# Patient Record
Sex: Female | Born: 1945
Health system: Southern US, Community
[De-identification: ages and names within clinical notes are randomized; demographics above are authoritative.]

## PROBLEM LIST (undated history)

## (undated) DIAGNOSIS — H409 Unspecified glaucoma: Secondary | ICD-10-CM

## (undated) DIAGNOSIS — M1990 Unspecified osteoarthritis, unspecified site: Secondary | ICD-10-CM

## (undated) DIAGNOSIS — M359 Systemic involvement of connective tissue, unspecified: Secondary | ICD-10-CM

## (undated) DIAGNOSIS — T7840XA Allergy, unspecified, initial encounter: Secondary | ICD-10-CM

## (undated) DIAGNOSIS — E669 Obesity, unspecified: Secondary | ICD-10-CM

## (undated) DIAGNOSIS — D649 Anemia, unspecified: Secondary | ICD-10-CM

## (undated) DIAGNOSIS — I10 Essential (primary) hypertension: Secondary | ICD-10-CM

## (undated) DIAGNOSIS — E119 Type 2 diabetes mellitus without complications: Secondary | ICD-10-CM

## (undated) DIAGNOSIS — K219 Gastro-esophageal reflux disease without esophagitis: Secondary | ICD-10-CM

## (undated) DIAGNOSIS — C50919 Malignant neoplasm of unspecified site of unspecified female breast: Secondary | ICD-10-CM

## (undated) DIAGNOSIS — E7800 Pure hypercholesterolemia, unspecified: Secondary | ICD-10-CM

## (undated) HISTORY — DX: Unspecified glaucoma: H40.9

## (undated) HISTORY — DX: Anemia, unspecified: D64.9

## (undated) HISTORY — DX: Essential (primary) hypertension: I10

## (undated) HISTORY — PX: BREAST SURGERY: SHX581

## (undated) HISTORY — DX: Allergy, unspecified, initial encounter: T78.40XA

## (undated) HISTORY — DX: Type 2 diabetes mellitus without complications: E11.9

## (undated) HISTORY — DX: Unspecified osteoarthritis, unspecified site: M19.90

## (undated) HISTORY — DX: Pure hypercholesterolemia, unspecified: E78.00

## (undated) HISTORY — DX: Gastro-esophageal reflux disease without esophagitis: K21.9

## (undated) HISTORY — DX: Obesity, unspecified: E66.9

## (undated) HISTORY — DX: Malignant neoplasm of unspecified site of unspecified female breast: C50.919

---

## 1991-01-20 HISTORY — PX: ABDOMINAL HYSTERECTOMY: SHX81

## 1993-01-19 HISTORY — PX: MASTECTOMY: SHX3

## 1997-08-24 ENCOUNTER — Ambulatory Visit (HOSPITAL_COMMUNITY): Admission: RE | Admit: 1997-08-24 | Discharge: 1997-08-24 | Payer: Self-pay | Admitting: Hematology and Oncology

## 1999-03-22 ENCOUNTER — Emergency Department (HOSPITAL_COMMUNITY): Admission: EM | Admit: 1999-03-22 | Discharge: 1999-03-22 | Payer: Self-pay | Admitting: Emergency Medicine

## 1999-05-06 ENCOUNTER — Encounter: Admission: RE | Admit: 1999-05-06 | Discharge: 1999-05-06 | Payer: Self-pay | Admitting: Internal Medicine

## 1999-05-06 ENCOUNTER — Encounter: Payer: Self-pay | Admitting: Internal Medicine

## 1999-10-16 ENCOUNTER — Emergency Department (HOSPITAL_COMMUNITY): Admission: EM | Admit: 1999-10-16 | Discharge: 1999-10-16 | Payer: Self-pay | Admitting: Emergency Medicine

## 1999-12-08 ENCOUNTER — Encounter: Admission: RE | Admit: 1999-12-08 | Discharge: 1999-12-08 | Payer: Self-pay | Admitting: Urology

## 1999-12-08 ENCOUNTER — Encounter: Payer: Self-pay | Admitting: Urology

## 2000-04-15 ENCOUNTER — Ambulatory Visit (HOSPITAL_BASED_OUTPATIENT_CLINIC_OR_DEPARTMENT_OTHER): Admission: RE | Admit: 2000-04-15 | Discharge: 2000-04-15 | Payer: Self-pay | Admitting: Urology

## 2000-05-05 ENCOUNTER — Encounter: Payer: Self-pay | Admitting: Family Medicine

## 2000-05-05 ENCOUNTER — Encounter: Admission: RE | Admit: 2000-05-05 | Discharge: 2000-05-05 | Payer: Self-pay | Admitting: Family Medicine

## 2000-06-03 ENCOUNTER — Encounter: Payer: Self-pay | Admitting: Family Medicine

## 2000-06-03 ENCOUNTER — Ambulatory Visit (HOSPITAL_COMMUNITY): Admission: RE | Admit: 2000-06-03 | Discharge: 2000-06-03 | Payer: Self-pay | Admitting: Family Medicine

## 2000-10-28 ENCOUNTER — Other Ambulatory Visit: Admission: RE | Admit: 2000-10-28 | Discharge: 2000-10-28 | Payer: Self-pay | Admitting: Family Medicine

## 2001-01-31 ENCOUNTER — Encounter: Payer: Self-pay | Admitting: Family Medicine

## 2001-01-31 ENCOUNTER — Ambulatory Visit (HOSPITAL_COMMUNITY): Admission: RE | Admit: 2001-01-31 | Discharge: 2001-01-31 | Payer: Self-pay | Admitting: Family Medicine

## 2001-02-03 ENCOUNTER — Ambulatory Visit (HOSPITAL_COMMUNITY): Admission: RE | Admit: 2001-02-03 | Discharge: 2001-02-03 | Payer: Self-pay | Admitting: Family Medicine

## 2001-02-03 ENCOUNTER — Encounter: Payer: Self-pay | Admitting: Family Medicine

## 2001-04-19 ENCOUNTER — Emergency Department (HOSPITAL_COMMUNITY): Admission: EM | Admit: 2001-04-19 | Discharge: 2001-04-19 | Payer: Self-pay | Admitting: *Deleted

## 2001-05-19 HISTORY — PX: COLONOSCOPY: SHX174

## 2001-06-01 ENCOUNTER — Ambulatory Visit (HOSPITAL_COMMUNITY): Admission: RE | Admit: 2001-06-01 | Discharge: 2001-06-01 | Payer: Self-pay | Admitting: General Surgery

## 2001-06-11 ENCOUNTER — Emergency Department (HOSPITAL_COMMUNITY): Admission: EM | Admit: 2001-06-11 | Discharge: 2001-06-11 | Payer: Self-pay | Admitting: Emergency Medicine

## 2001-06-22 ENCOUNTER — Encounter: Payer: Self-pay | Admitting: Obstetrics and Gynecology

## 2001-06-22 ENCOUNTER — Ambulatory Visit (HOSPITAL_COMMUNITY): Admission: RE | Admit: 2001-06-22 | Discharge: 2001-06-22 | Payer: Self-pay | Admitting: Obstetrics and Gynecology

## 2001-12-14 ENCOUNTER — Encounter: Payer: Self-pay | Admitting: Hematology & Oncology

## 2001-12-14 ENCOUNTER — Ambulatory Visit (HOSPITAL_COMMUNITY): Admission: RE | Admit: 2001-12-14 | Discharge: 2001-12-14 | Payer: Self-pay | Admitting: Hematology & Oncology

## 2001-12-27 ENCOUNTER — Encounter: Payer: Self-pay | Admitting: Hematology & Oncology

## 2001-12-27 ENCOUNTER — Encounter: Admission: RE | Admit: 2001-12-27 | Discharge: 2001-12-27 | Payer: Self-pay | Admitting: Hematology & Oncology

## 2002-02-17 ENCOUNTER — Encounter: Admission: RE | Admit: 2002-02-17 | Discharge: 2002-04-03 | Payer: Self-pay | Admitting: *Deleted

## 2002-04-26 ENCOUNTER — Ambulatory Visit (HOSPITAL_COMMUNITY): Admission: RE | Admit: 2002-04-26 | Discharge: 2002-04-26 | Payer: Self-pay | Admitting: *Deleted

## 2002-08-24 ENCOUNTER — Emergency Department (HOSPITAL_COMMUNITY): Admission: AD | Admit: 2002-08-24 | Discharge: 2002-08-24 | Payer: Self-pay | Admitting: Emergency Medicine

## 2002-12-28 ENCOUNTER — Ambulatory Visit (HOSPITAL_COMMUNITY): Admission: RE | Admit: 2002-12-28 | Discharge: 2002-12-28 | Payer: Self-pay | Admitting: Family Medicine

## 2003-01-01 ENCOUNTER — Ambulatory Visit (HOSPITAL_COMMUNITY): Admission: RE | Admit: 2003-01-01 | Discharge: 2003-01-01 | Payer: Self-pay | Admitting: Family Medicine

## 2003-02-05 ENCOUNTER — Encounter: Admission: RE | Admit: 2003-02-05 | Discharge: 2003-04-26 | Payer: Self-pay | Admitting: Urology

## 2003-09-25 ENCOUNTER — Emergency Department (HOSPITAL_COMMUNITY): Admission: EM | Admit: 2003-09-25 | Discharge: 2003-09-25 | Payer: Self-pay | Admitting: Emergency Medicine

## 2004-02-21 ENCOUNTER — Ambulatory Visit: Payer: Self-pay | Admitting: Family Medicine

## 2004-03-04 ENCOUNTER — Ambulatory Visit: Payer: Self-pay | Admitting: Family Medicine

## 2004-03-21 ENCOUNTER — Ambulatory Visit: Payer: Self-pay | Admitting: Family Medicine

## 2004-03-25 ENCOUNTER — Encounter: Admission: RE | Admit: 2004-03-25 | Discharge: 2004-03-25 | Payer: Self-pay | Admitting: Family Medicine

## 2004-06-20 ENCOUNTER — Ambulatory Visit: Payer: Self-pay | Admitting: Family Medicine

## 2004-07-07 ENCOUNTER — Ambulatory Visit: Payer: Self-pay | Admitting: Family Medicine

## 2004-08-07 ENCOUNTER — Ambulatory Visit: Payer: Self-pay | Admitting: Hematology & Oncology

## 2004-08-11 ENCOUNTER — Ambulatory Visit: Payer: Self-pay | Admitting: Family Medicine

## 2004-09-11 ENCOUNTER — Ambulatory Visit: Payer: Self-pay | Admitting: Family Medicine

## 2004-10-27 ENCOUNTER — Ambulatory Visit: Payer: Self-pay | Admitting: Family Medicine

## 2004-12-23 ENCOUNTER — Ambulatory Visit: Payer: Self-pay | Admitting: Family Medicine

## 2005-01-27 ENCOUNTER — Ambulatory Visit: Payer: Self-pay | Admitting: Family Medicine

## 2005-03-18 ENCOUNTER — Ambulatory Visit: Payer: Self-pay | Admitting: Family Medicine

## 2005-04-14 ENCOUNTER — Ambulatory Visit: Payer: Self-pay | Admitting: Family Medicine

## 2005-06-03 ENCOUNTER — Ambulatory Visit: Payer: Self-pay | Admitting: Family Medicine

## 2005-07-17 ENCOUNTER — Encounter (INDEPENDENT_AMBULATORY_CARE_PROVIDER_SITE_OTHER): Payer: Self-pay | Admitting: Specialist

## 2005-07-17 ENCOUNTER — Ambulatory Visit (HOSPITAL_BASED_OUTPATIENT_CLINIC_OR_DEPARTMENT_OTHER): Admission: RE | Admit: 2005-07-17 | Discharge: 2005-07-17 | Payer: Self-pay | Admitting: Urology

## 2005-07-29 ENCOUNTER — Ambulatory Visit: Payer: Self-pay | Admitting: Hematology & Oncology

## 2005-09-24 ENCOUNTER — Ambulatory Visit: Payer: Self-pay | Admitting: Family Medicine

## 2005-10-27 ENCOUNTER — Ambulatory Visit: Payer: Self-pay | Admitting: Family Medicine

## 2005-11-10 ENCOUNTER — Ambulatory Visit: Payer: Self-pay | Admitting: Family Medicine

## 2005-12-08 ENCOUNTER — Encounter: Admission: RE | Admit: 2005-12-08 | Discharge: 2005-12-08 | Payer: Self-pay | Admitting: Family Medicine

## 2006-01-05 ENCOUNTER — Other Ambulatory Visit: Admission: RE | Admit: 2006-01-05 | Discharge: 2006-01-05 | Payer: Self-pay | Admitting: Family Medicine

## 2006-01-05 ENCOUNTER — Ambulatory Visit: Payer: Self-pay | Admitting: Family Medicine

## 2006-01-05 ENCOUNTER — Encounter: Payer: Self-pay | Admitting: Family Medicine

## 2006-01-05 LAB — CONVERTED CEMR LAB: Pap Smear: NORMAL

## 2006-01-15 ENCOUNTER — Ambulatory Visit (HOSPITAL_COMMUNITY): Admission: RE | Admit: 2006-01-15 | Discharge: 2006-01-15 | Payer: Self-pay | Admitting: Family Medicine

## 2006-02-02 ENCOUNTER — Encounter: Payer: Self-pay | Admitting: Family Medicine

## 2006-02-02 LAB — CONVERTED CEMR LAB
Blood Glucose, Fasting: 92 mg/dL
RBC count: 3.96 10*6/uL
TSH: 1.767 microintl units/mL
WBC, blood: 3.4 10*3/uL

## 2006-03-17 ENCOUNTER — Ambulatory Visit: Payer: Self-pay | Admitting: Family Medicine

## 2006-03-17 LAB — CONVERTED CEMR LAB
Bilirubin Urine: NEGATIVE
Hemoglobin, Urine: NEGATIVE
Ketones, ur: NEGATIVE mg/dL
Leukocytes, UA: NEGATIVE
Nitrite: NEGATIVE
Protein, ur: NEGATIVE mg/dL
Red Sub, UA: NEGATIVE
Specific Gravity, Urine: 1.014 (ref 1.005–1.03)
Urine Glucose: NEGATIVE mg/dL
Urobilinogen, UA: 1 (ref 0.0–1.0)
pH: 6.5 (ref 5.0–8.0)

## 2006-03-18 ENCOUNTER — Encounter: Payer: Self-pay | Admitting: Family Medicine

## 2006-04-07 ENCOUNTER — Ambulatory Visit: Payer: Self-pay | Admitting: Family Medicine

## 2006-07-08 ENCOUNTER — Ambulatory Visit: Payer: Self-pay | Admitting: Family Medicine

## 2006-08-02 ENCOUNTER — Encounter: Payer: Self-pay | Admitting: Family Medicine

## 2006-08-02 LAB — CONVERTED CEMR LAB
BUN: 11 mg/dL (ref 6–23)
CO2: 23 meq/L (ref 19–32)
Calcium: 9.2 mg/dL (ref 8.4–10.5)
Chloride: 105 meq/L (ref 96–112)
Cholesterol: 182 mg/dL (ref 0–200)
Creatinine, Ser: 0.95 mg/dL (ref 0.40–1.20)
Glucose, Bld: 85 mg/dL (ref 70–99)
HDL: 44 mg/dL (ref >39)
LDL Cholesterol: 125 mg/dL — ABNORMAL HIGH (ref 0–99)
Potassium: 4.2 meq/L (ref 3.5–5.3)
Sodium: 141 meq/L (ref 135–145)
Total CHOL/HDL Ratio: 4.1
Triglycerides: 67 mg/dL (ref <150)
VLDL: 13 mg/dL (ref 0–40)

## 2006-08-04 ENCOUNTER — Ambulatory Visit: Payer: Self-pay | Admitting: Hematology & Oncology

## 2006-08-10 ENCOUNTER — Ambulatory Visit: Payer: Self-pay | Admitting: Family Medicine

## 2006-10-18 ENCOUNTER — Ambulatory Visit: Payer: Self-pay | Admitting: Family Medicine

## 2006-12-24 ENCOUNTER — Encounter: Admission: RE | Admit: 2006-12-24 | Discharge: 2006-12-24 | Payer: Self-pay | Admitting: Family Medicine

## 2007-01-25 ENCOUNTER — Ambulatory Visit: Payer: Self-pay | Admitting: Family Medicine

## 2007-02-08 ENCOUNTER — Encounter: Payer: Self-pay | Admitting: Family Medicine

## 2007-02-08 DIAGNOSIS — Z853 Personal history of malignant neoplasm of breast: Secondary | ICD-10-CM

## 2007-02-08 DIAGNOSIS — E663 Overweight: Secondary | ICD-10-CM

## 2007-02-08 DIAGNOSIS — I10 Essential (primary) hypertension: Secondary | ICD-10-CM

## 2007-02-22 ENCOUNTER — Ambulatory Visit: Payer: Self-pay | Admitting: Family Medicine

## 2007-03-28 ENCOUNTER — Encounter: Payer: Self-pay | Admitting: Family Medicine

## 2007-03-28 LAB — CONVERTED CEMR LAB
BUN: 11 mg/dL (ref 6–23)
Basophils Absolute: 0 10*3/uL (ref 0.0–0.1)
Basophils Relative: 1 % (ref 0–1)
CO2: 25 meq/L (ref 19–32)
Calcium: 8.9 mg/dL (ref 8.4–10.5)
Chloride: 105 meq/L (ref 96–112)
Cholesterol: 173 mg/dL (ref 0–200)
Creatinine, Ser: 0.88 mg/dL (ref 0.40–1.20)
Eosinophils Absolute: 0 10*3/uL (ref 0.0–0.7)
Eosinophils Relative: 1 % (ref 0–5)
Glucose, Bld: 94 mg/dL (ref 70–99)
HCT: 36.9 % (ref 36.0–46.0)
HDL: 42 mg/dL (ref >39)
Hemoglobin: 11.7 g/dL — ABNORMAL LOW (ref 12.0–15.0)
LDL Cholesterol: 119 mg/dL — ABNORMAL HIGH (ref 0–99)
Lymphocytes Relative: 28 % (ref 12–46)
Lymphs Abs: 1 10*3/uL (ref 0.7–4.0)
MCHC: 31.7 g/dL (ref 30.0–36.0)
MCV: 92.3 fL (ref 78.0–100.0)
Monocytes Absolute: 0.2 10*3/uL (ref 0.1–1.0)
Monocytes Relative: 6 % (ref 3–12)
Neutro Abs: 2.4 10*3/uL (ref 1.7–7.7)
Neutrophils Relative %: 65 % (ref 43–77)
Platelets: 263 10*3/uL (ref 150–400)
Potassium: 4.3 meq/L (ref 3.5–5.3)
RBC: 4 M/uL (ref 3.87–5.11)
RDW: 14.7 % (ref 11.5–15.5)
Sodium: 137 meq/L (ref 135–145)
Total CHOL/HDL Ratio: 4.1
Triglycerides: 61 mg/dL (ref <150)
VLDL: 12 mg/dL (ref 0–40)
WBC: 3.7 10*3/uL — ABNORMAL LOW (ref 4.0–10.5)

## 2007-03-30 ENCOUNTER — Ambulatory Visit: Payer: Self-pay | Admitting: Family Medicine

## 2007-05-09 ENCOUNTER — Ambulatory Visit: Payer: Self-pay | Admitting: Family Medicine

## 2007-06-30 ENCOUNTER — Ambulatory Visit: Payer: Self-pay | Admitting: Family Medicine

## 2007-07-28 ENCOUNTER — Ambulatory Visit: Payer: Self-pay | Admitting: Family Medicine

## 2007-09-08 ENCOUNTER — Telehealth: Payer: Self-pay | Admitting: Family Medicine

## 2007-10-31 ENCOUNTER — Ambulatory Visit: Payer: Self-pay | Admitting: Family Medicine

## 2007-11-01 ENCOUNTER — Encounter: Payer: Self-pay | Admitting: Family Medicine

## 2008-01-03 ENCOUNTER — Encounter: Admission: RE | Admit: 2008-01-03 | Discharge: 2008-01-03 | Payer: Self-pay | Admitting: Family Medicine

## 2008-02-01 ENCOUNTER — Other Ambulatory Visit: Admission: RE | Admit: 2008-02-01 | Discharge: 2008-02-01 | Payer: Self-pay | Admitting: Family Medicine

## 2008-02-01 ENCOUNTER — Encounter: Payer: Self-pay | Admitting: Family Medicine

## 2008-02-01 ENCOUNTER — Ambulatory Visit: Payer: Self-pay | Admitting: Family Medicine

## 2008-02-01 DIAGNOSIS — D280 Benign neoplasm of vulva: Secondary | ICD-10-CM | POA: Insufficient documentation

## 2008-02-01 DIAGNOSIS — R5383 Other fatigue: Secondary | ICD-10-CM

## 2008-02-09 ENCOUNTER — Encounter: Payer: Self-pay | Admitting: Family Medicine

## 2008-03-05 ENCOUNTER — Telehealth: Payer: Self-pay | Admitting: Family Medicine

## 2008-04-10 ENCOUNTER — Encounter: Payer: Self-pay | Admitting: Family Medicine

## 2008-04-11 LAB — CONVERTED CEMR LAB
ALT: 11 units/L (ref 0–35)
AST: 14 units/L (ref 0–37)
Albumin: 3.7 g/dL (ref 3.5–5.2)
Alkaline Phosphatase: 68 units/L (ref 39–117)
BUN: 11 mg/dL (ref 6–23)
Bilirubin, Direct: 0.1 mg/dL (ref 0.0–0.3)
CO2: 22 meq/L (ref 19–32)
Calcium: 8.8 mg/dL (ref 8.4–10.5)
Chloride: 105 meq/L (ref 96–112)
Cholesterol: 196 mg/dL (ref 0–200)
Creatinine, Ser: 0.93 mg/dL (ref 0.40–1.20)
Glucose, Bld: 102 mg/dL — ABNORMAL HIGH (ref 70–99)
HCT: 38.3 % (ref 36.0–46.0)
HDL: 39 mg/dL — ABNORMAL LOW (ref >39)
Hemoglobin: 12.2 g/dL (ref 12.0–15.0)
Indirect Bilirubin: 0.3 mg/dL (ref 0.0–0.9)
LDL Cholesterol: 138 mg/dL — ABNORMAL HIGH (ref 0–99)
MCHC: 31.9 g/dL (ref 30.0–36.0)
MCV: 91.2 fL (ref 78.0–100.0)
Platelets: 255 10*3/uL (ref 150–400)
Potassium: 4 meq/L (ref 3.5–5.3)
RBC: 4.2 M/uL (ref 3.87–5.11)
RDW: 14.8 % (ref 11.5–15.5)
Sodium: 139 meq/L (ref 135–145)
Total Bilirubin: 0.4 mg/dL (ref 0.3–1.2)
Total CHOL/HDL Ratio: 5
Total Protein: 7.8 g/dL (ref 6.0–8.3)
Triglycerides: 96 mg/dL (ref <150)
VLDL: 19 mg/dL (ref 0–40)
WBC: 3.9 10*3/uL — ABNORMAL LOW (ref 4.0–10.5)

## 2008-07-26 ENCOUNTER — Ambulatory Visit: Payer: Self-pay | Admitting: Family Medicine

## 2008-07-26 DIAGNOSIS — B351 Tinea unguium: Secondary | ICD-10-CM

## 2008-08-23 ENCOUNTER — Ambulatory Visit: Payer: Self-pay | Admitting: Family Medicine

## 2008-08-23 DIAGNOSIS — Z13220 Encounter for screening for lipoid disorders: Secondary | ICD-10-CM

## 2008-08-28 ENCOUNTER — Telehealth: Payer: Self-pay | Admitting: Family Medicine

## 2008-10-19 LAB — CONVERTED CEMR LAB
ALT: 17 units/L (ref 0–35)
AST: 18 units/L (ref 0–37)
Albumin: 4.2 g/dL (ref 3.5–5.2)
Alkaline Phosphatase: 71 units/L (ref 39–117)
BUN: 13 mg/dL (ref 6–23)
Bilirubin, Direct: 0.1 mg/dL (ref 0.0–0.3)
CO2: 24 meq/L (ref 19–32)
Calcium: 9.6 mg/dL (ref 8.4–10.5)
Chloride: 105 meq/L (ref 96–112)
Cholesterol: 168 mg/dL (ref 0–200)
Creatinine, Ser: 0.87 mg/dL (ref 0.40–1.20)
Glucose, Bld: 90 mg/dL (ref 70–99)
HDL: 46 mg/dL (ref >39)
Indirect Bilirubin: 0.3 mg/dL (ref 0.0–0.9)
LDL Cholesterol: 109 mg/dL — ABNORMAL HIGH (ref 0–99)
Potassium: 4.9 meq/L (ref 3.5–5.3)
Sodium: 141 meq/L (ref 135–145)
TSH: 2.372 microintl units/mL (ref 0.350–4.500)
Total Bilirubin: 0.4 mg/dL (ref 0.3–1.2)
Total CHOL/HDL Ratio: 3.7
Total Protein: 8 g/dL (ref 6.0–8.3)
Triglycerides: 65 mg/dL (ref <150)
VLDL: 13 mg/dL (ref 0–40)

## 2008-10-31 ENCOUNTER — Ambulatory Visit: Payer: Self-pay | Admitting: Family Medicine

## 2008-10-31 DIAGNOSIS — N3010 Interstitial cystitis (chronic) without hematuria: Secondary | ICD-10-CM | POA: Insufficient documentation

## 2009-01-04 ENCOUNTER — Ambulatory Visit (HOSPITAL_COMMUNITY): Admission: RE | Admit: 2009-01-04 | Discharge: 2009-01-04 | Payer: Self-pay | Admitting: Family Medicine

## 2009-03-05 ENCOUNTER — Ambulatory Visit: Payer: Self-pay | Admitting: Family Medicine

## 2009-03-05 LAB — CONVERTED CEMR LAB
Bilirubin Urine: NEGATIVE
Blood in Urine, dipstick: NEGATIVE
Glucose, Urine, Semiquant: NEGATIVE
Ketones, urine, test strip: NEGATIVE
Nitrite: NEGATIVE
Protein, U semiquant: NEGATIVE
Specific Gravity, Urine: 1.015
Urobilinogen, UA: 0.2
pH: 6.5

## 2009-03-06 ENCOUNTER — Encounter: Payer: Self-pay | Admitting: Family Medicine

## 2009-06-07 ENCOUNTER — Ambulatory Visit: Payer: Self-pay | Admitting: Family Medicine

## 2009-06-07 LAB — CONVERTED CEMR LAB
Bilirubin Urine: NEGATIVE
Blood in Urine, dipstick: NEGATIVE
Glucose, Urine, Semiquant: NEGATIVE
Ketones, urine, test strip: NEGATIVE
Nitrite: NEGATIVE
Protein, U semiquant: NEGATIVE
Specific Gravity, Urine: 1.02
Urobilinogen, UA: 0.2
WBC Urine, dipstick: NEGATIVE
pH: 7

## 2009-06-11 LAB — CONVERTED CEMR LAB
ALT: 13 units/L (ref 0–35)
AST: 14 units/L (ref 0–37)
Albumin: 4 g/dL (ref 3.5–5.2)
Alkaline Phosphatase: 69 units/L (ref 39–117)
BUN: 10 mg/dL (ref 6–23)
Basophils Absolute: 0 10*3/uL (ref 0.0–0.1)
Basophils Relative: 1 % (ref 0–1)
Bilirubin, Direct: 0.1 mg/dL (ref 0.0–0.3)
CO2: 24 meq/L (ref 19–32)
Calcium: 9.7 mg/dL (ref 8.4–10.5)
Chloride: 105 meq/L (ref 96–112)
Cholesterol: 162 mg/dL (ref 0–200)
Creatinine, Ser: 0.86 mg/dL (ref 0.40–1.20)
Eosinophils Absolute: 0 10*3/uL (ref 0.0–0.7)
Eosinophils Relative: 1 % (ref 0–5)
Glucose, Bld: 94 mg/dL (ref 70–99)
HCT: 38.6 % (ref 36.0–46.0)
HDL: 44 mg/dL (ref >39)
Hemoglobin: 12 g/dL (ref 12.0–15.0)
Indirect Bilirubin: 0.2 mg/dL (ref 0.0–0.9)
LDL Cholesterol: 105 mg/dL — ABNORMAL HIGH (ref 0–99)
Lymphocytes Relative: 36 % (ref 12–46)
Lymphs Abs: 1.1 10*3/uL (ref 0.7–4.0)
MCHC: 31.1 g/dL (ref 30.0–36.0)
MCV: 91 fL (ref 78.0–100.0)
Monocytes Absolute: 0.1 10*3/uL (ref 0.1–1.0)
Monocytes Relative: 2 % — ABNORMAL LOW (ref 3–12)
Neutro Abs: 1.8 10*3/uL (ref 1.7–7.7)
Neutrophils Relative %: 60 % (ref 43–77)
Platelets: 296 10*3/uL (ref 150–400)
Potassium: 4 meq/L (ref 3.5–5.3)
RBC: 4.24 M/uL (ref 3.87–5.11)
RDW: 14 % (ref 11.5–15.5)
Sodium: 141 meq/L (ref 135–145)
Total Bilirubin: 0.3 mg/dL (ref 0.3–1.2)
Total CHOL/HDL Ratio: 3.7
Total Protein: 8 g/dL (ref 6.0–8.3)
Triglycerides: 63 mg/dL (ref <150)
VLDL: 13 mg/dL (ref 0–40)
Vit D, 25-Hydroxy: 35 ng/mL (ref 30–89)
WBC: 3 10*3/uL — ABNORMAL LOW (ref 4.0–10.5)

## 2009-06-27 ENCOUNTER — Encounter: Payer: Self-pay | Admitting: Family Medicine

## 2009-11-06 ENCOUNTER — Ambulatory Visit: Payer: Self-pay | Admitting: Family Medicine

## 2009-11-06 LAB — CONVERTED CEMR LAB
ALT: 14 units/L (ref 0–35)
AST: 14 units/L (ref 0–37)
Albumin: 4.1 g/dL (ref 3.5–5.2)
Alkaline Phosphatase: 78 units/L (ref 39–117)
BUN: 13 mg/dL (ref 6–23)
Bilirubin, Direct: 0.1 mg/dL (ref 0.0–0.3)
CO2: 27 meq/L (ref 19–32)
Calcium: 9.7 mg/dL (ref 8.4–10.5)
Chloride: 102 meq/L (ref 96–112)
Cholesterol: 180 mg/dL (ref 0–200)
Creatinine, Ser: 0.92 mg/dL (ref 0.40–1.20)
Glucose, Bld: 95 mg/dL (ref 70–99)
HDL: 41 mg/dL (ref >39)
Indirect Bilirubin: 0.3 mg/dL (ref 0.0–0.9)
LDL Cholesterol: 123 mg/dL — ABNORMAL HIGH (ref 0–99)
Potassium: 4.3 meq/L (ref 3.5–5.3)
Sodium: 142 meq/L (ref 135–145)
TSH: 2.434 microintl units/mL (ref 0.350–4.500)
Total Bilirubin: 0.4 mg/dL (ref 0.3–1.2)
Total CHOL/HDL Ratio: 4.4
Total Protein: 8.1 g/dL (ref 6.0–8.3)
Triglycerides: 79 mg/dL (ref <150)
VLDL: 16 mg/dL (ref 0–40)

## 2009-11-10 DIAGNOSIS — K219 Gastro-esophageal reflux disease without esophagitis: Secondary | ICD-10-CM

## 2009-11-15 ENCOUNTER — Telehealth: Payer: Self-pay | Admitting: Family Medicine

## 2010-01-06 ENCOUNTER — Telehealth (INDEPENDENT_AMBULATORY_CARE_PROVIDER_SITE_OTHER): Payer: Self-pay | Admitting: *Deleted

## 2010-01-07 ENCOUNTER — Telehealth: Payer: Self-pay | Admitting: Family Medicine

## 2010-01-07 ENCOUNTER — Encounter: Payer: Self-pay | Admitting: Family Medicine

## 2010-01-24 ENCOUNTER — Ambulatory Visit (HOSPITAL_COMMUNITY)
Admission: RE | Admit: 2010-01-24 | Discharge: 2010-01-24 | Payer: Self-pay | Source: Home / Self Care | Attending: Family Medicine | Admitting: Family Medicine

## 2010-02-09 ENCOUNTER — Encounter: Payer: Self-pay | Admitting: Family Medicine

## 2010-02-10 ENCOUNTER — Encounter: Payer: Self-pay | Admitting: Neurosurgery

## 2010-02-16 LAB — CONVERTED CEMR LAB: OCCULT 1: NEGATIVE

## 2010-02-20 NOTE — Assessment & Plan Note (Signed)
Summary: office visit   Vital Signs:  Patient profile:   65 year old female Menstrual status:  hysterectomy Height:      69 inches Weight:      214.75 pounds BMI:     31.83 O2 Sat:      98 % Pulse rate:   94 / minute Pulse rhythm:   regular Resp:     16 per minute BP sitting:   120 / 86  (left arm) Cuff size:   large  Vitals Entered By: Everitt Amber LPN (Jun 07, 2009 9:56 AM)  Nutrition Counseling: Patient's BMI is greater than 25 and therefore counseled on weight management options. CC: Follow up htn   CC:  Follow up htn.  History of Present Illness: Reports  that she hjas been doing fairly well. She is concerned that her urinary symptoms havbe worsened in recent times with dysuria and frequency particularly at night. She was on med for IC in the past and feels as though she needs re-eval for this. She has not been diligent in lifestyle changes for weight loss, with no iomprovement in this area. Denies recent fever or chills. Denies sinus pressure, nasal congestion , ear pain or sore throat. Denies chest congestion, or cough productive of sputum. Denies chest pain, palpitations, PND, orthopnea or leg swelling. Denies abdominal pain, nausea, vomitting, diarrhea or constipation. Denies change in bowel movements or bloody stool.  She does have   joint pain, swelling, and  reduced mobility involving her back as well as hips , shoulders and knees Denies headaches, vertigo, seizures. Denies depression, anxiety or insomnia.      Current Medications (verified): 1)  Adult Aspirin Ec Low Strength 81 Mg  Tbec (Aspirin) .... Take One Tablet By Mouth Once A Day 2)  Phenazopyridine Hcl 200 Mg  Tabs (Phenazopyridine Hcl) .... Take One Tablet By Mouth Three Times A Day 3)  Oscal 500/200 D-3 500-200 Mg-Unit  Tabs (Calcium-Vitamin D) .... Take One Tablet By Mouth Twice A Day 4)  Womens One Daily   Tabs (Multiple Vitamins-Minerals) .... One Tab By Mouth Once Daily 5)  Dicyclomine Hcl 10  Mg  Caps (Dicyclomine Hcl) .... One Cap By Mouth Three Times A Day 6)  Ranitidine Hcl 150 Mg  Tabs (Ranitidine Hcl) .... One Tab By Mouth Once Daily 7)  Docusate Sodium 100 Mg Tabs (Docusate Sodium) .... Take 1 Tab By Mouth Once Daily As Needed 8)  Simvastatin 10 Mg Tabs (Simvastatin) .... One Tab By Mouth Qhs 9)  Fish Oil 1000 Mg Caps (Omega-3 Fatty Acids) .... Use As Directed 10)  Vitamin D 400 Unit Tabs (Cholecalciferol) .... Take 1 Tablet By Mouth Once A Day 11)  Amlodipine Besylate 10 Mg Tabs (Amlodipine Besylate) .... Take 1 Tablet By Mouth Once A Day 12)  Benazepril Hcl 40 Mg Tabs (Benazepril Hcl) .... One Tab By Mouth Once Daily  Allergies (verified): 1)  ! Levaquin 2)  ! Pcn 3)  ! Sulfa 4)  ! Macrobid  Review of Systems      See HPI Eyes:  Denies blurring, double vision, itching, and red eye. GU:  Complains of dysuria and urinary frequency; 2 week hiostory of increased symptoms with back pain. Derm:  Complains of changes in nail beds; fungal infection in  great toe and little toe. Endo:  Denies cold intolerance, excessive hunger, excessive thirst, excessive urination, heat intolerance, polyuria, and weight change. Heme:  Denies abnormal bruising and bleeding. Allergy:  Complains of seasonal allergies.  Physical Exam  General:  alert, well-hydrated, and overweight-appearing HEENT: No facial asymmetry,  EOMI, No sinus tenderness, TM's Clear, oropharynx  pink and moist. Necvk supple, no jVD , no adenopathy  Chest: Clear to auscultation bilaterally.  CVS: S1, S2, No murmurs, No S3.   Abd: Soft, Nontender. No renal angle tenderness MS: Adequate ROM spine, hips, shoulders and knees. though reduced Ext: No edema.   CNS: CN 2-12 intact, power tone and sensation normal throughout.   Skin: Intact, fungal skin infection between 4th and 5th right toes Psych: Good eye contact, normal affect.  Memory intact, not anxious or depressed appearing.    Impression &  Recommendations:  Problem # 1:  ACUTE CYSTITIS (ICD-595.0) Assessment Comment Only  The following medications were removed from the medication list:    Ciprofloxacin Hcl 500 Mg Tabs (Ciprofloxacin hcl) .Marland Kitchen... Take 1 tablet by mouth two times a day    Ciprofloxacin Hcl 500 Mg Tabs (Ciprofloxacin hcl) ..... One tab by mouth two times a day  Orders: UA Dipstick W/ Micro (manual) (81000)pt reassured no evidence of infection  Problem # 2:  INTERSTITIAL CYSTITIS (ICD-595.1) Assessment: Deteriorated  Orders: Urology Referral (Urology)  Problem # 3:  HYPERLIPIDEMIA (ICD-272.4) Assessment: Comment Only  Her updated medication list for this problem includes:    Simvastatin 10 Mg Tabs (Simvastatin) ..... One tab by mouth qhs  Orders: T-Hepatic Function [id number]) T-Lipid Profile [phone]) T-Hepatic Function [id number]) T-Lipid Profile [phone])  Labs Reviewed: SGOT: 18 (10/19/2008)   SGPT: 17 (10/19/2008)   HDL:46 (10/19/2008), 39 (04/10/2008)  LDL:109 (10/19/2008), 138 (04/10/2008)  Chol:168 (10/19/2008), 196 (04/10/2008)  Trig:65 (10/19/2008), 96 (04/10/2008)  Problem # 4:  ESSENTIAL HYPERTENSION, BENIGN (ICD-401.1) Assessment: Unchanged  Her updated medication list for this problem includes:    Amlodipine Besylate 10 Mg Tabs (Amlodipine besylate) .Marland Kitchen... Take 1 tablet by mouth once a day    Benazepril Hcl 40 Mg Tabs (Benazepril hcl) ..... One tab by mouth once daily  BP today: 120/86 Prior BP: 120/82 (03/05/2009)  Labs Reviewed: K+: 4.9 (10/19/2008) Creat: : 0.87 (10/19/2008)   Chol: 168 (10/19/2008)   HDL: 46 (10/19/2008)   LDL: 109 (10/19/2008)   TG: 65 (10/19/2008)  Problem # 5:  OBESITY, UNSPECIFIED (ICD-278.00) Assessment: Unchanged  Ht: 69 (06/07/2009)   Wt: 214.75 (06/07/2009)   BMI: 31.83 (06/07/2009)  Problem # 6:  ADENOCARCINOMA, BREAST, HX OF (ICD-V10.3) Assessment: Comment Only mamo UTD, pt is more than 5 yrs disease free  Complete Medication  List: 1)  Adult Aspirin Ec Low Strength 81 Mg Tbec (Aspirin) .... Take one tablet by mouth once a day 2)  Phenazopyridine Hcl 200 Mg Tabs (Phenazopyridine hcl) .... Take one tablet by mouth three times a day 3)  Oscal 500/200 D-3 500-200 Mg-unit Tabs (Calcium-vitamin d) .... Take one tablet by mouth twice a day 4)  Womens One Daily Tabs (Multiple vitamins-minerals) .... One tab by mouth once daily 5)  Dicyclomine Hcl 10 Mg Caps (Dicyclomine hcl) .... One cap by mouth three times a day 6)  Ranitidine Hcl 150 Mg Tabs (Ranitidine hcl) .... One tab by mouth once daily 7)  Docusate Sodium 100 Mg Tabs (Docusate sodium) .... Take 1 tab by mouth once daily as needed 8)  Simvastatin 10 Mg Tabs (Simvastatin) .... One tab by mouth qhs 9)  Fish Oil 1000 Mg Caps (Omega-3 fatty acids) .... Use as directed 10)  Vitamin D 400 Unit Tabs (Cholecalciferol) .... Take 1 tablet by mouth once a day 11)  Amlodipine  Besylate 10 Mg Tabs (Amlodipine besylate) .... Take 1 tablet by mouth once a day 12)  Benazepril Hcl 40 Mg Tabs (Benazepril hcl) .... One tab by mouth once daily 13)  Terbinafine Hcl 250 Mg Tabs (Terbinafine hcl) .... Take 1 tablet by mouth once a day  Other Orders: T-Basic Metabolic Panel [id number]) T-CBC w/Diff [phone]) T-Vitamin D (25-Hydroxy) [id number]) T-CBC w/Diff [id number])  Patient Instructions: 1)  Please schedule a follow-up appointment in 4.5 months.Needs rectal at that time 2)  It is important that you exercise regularly at least 20 minutes 5 times a week. If you develop chest pain, have severe difficulty breathing, or feel very tired , stop exercising immediately and seek medical attention. 3)  You need to lose weight. Consider a lower calorie diet and regular exercise.  4)  BMP prior to visit, ICD-9: 5)  Hepatic Panel prior to visit, ICD-9:  fasting asap 6)  Lipid Panel prior to visit, ICD-9: 7)  cBC and Vit D 8)  No med changes at this visit 9)  Hepatic Panel prior to  visit, ICD-9: 10)  Lipid Panel prior to visit, ICD-9: 11)  CBC w/ Diff prior to visit, ICD-9: Prescriptions: AMLODIPINE BESYLATE 10 MG TABS (AMLODIPINE BESYLATE) Take 1 tablet by mouth once a day  #90 x 3   Entered by:   Everitt Amber LPN   Authorized by:   Syliva Overman MD   Signed by:   Everitt Amber LPN on 42/59/5638   Method used:   Faxed to ...       [facility] (mail-order)             , [place]         Ph: [phone]       Fax: [phone]   RxID:   [id number] SIMVASTATIN 10 MG TABS (SIMVASTATIN) one tab by mouth qhs  #90 x 3   Entered by:   Everitt Amber LPN   Authorized by:   Syliva Overman MD   Signed by:   Everitt Amber LPN on 25/42/7062   Method used:   Faxed to ...       [facility] (mail-order)             , [place]         Ph: [phone]       Fax: [phone]   RxID:   [id number] RANITIDINE HCL 150 MG  TABS (RANITIDINE HCL) one tab by mouth once daily  #90 x 3   Entered by:   Everitt Amber LPN   Authorized by:   Syliva Overman MD   Signed by:   Everitt Amber LPN on 38/18/2993   Method used:   Faxed to ...       [facility] (mail-order)             , [place]         Ph: [phone]       Fax: [phone]   RxID:   [id number] DICYCLOMINE HCL 10 MG  CAPS (DICYCLOMINE HCL) one cap by mouth three times a day  #270 x 3   Entered by:   Everitt Amber LPN   Authorized by:   Syliva Overman MD   Signed by:   Everitt Amber LPN on 15/40/0867   Method used:   Faxed to ...       [facility] (mail-order)             , [place]  Ph: [phone]       Fax: [phone]   RxID:   [id number] TERBINAFINE HCL 250 MG TABS (TERBINAFINE HCL) Take 1 tablet by mouth once a day  #30 x 2   Entered and Authorized by:   Syliva Overman MD   Signed by:   Syliva Overman MD on 06/07/2009   Method used:   Electronically to        [facility] (retail)       [address]       [place], [place]  [address]       Ph:  [phone]       Fax: [phone]   RxID:   [id number]   Laboratory Results   Urine Tests    Routine Urinalysis   Color: lt. yellow Appearance: Clear Glucose: negative   (Normal Range: Negative) Bilirubin: negative   (Normal Range: Negative) Ketone: negative   (Normal Range: Negative) Spec. Gravity: 1.020   (Normal Range: 1.003-1.035) Blood: negative   (Normal Range: Negative) pH: 7.0   (Normal Range: 5.0-8.0) Protein: negative   (Normal Range: Negative) Urobilinogen: 0.2   (Normal Range: 0-1) Nitrite: negative   (Normal Range: Negative) Leukocyte Esterace: negative   (Normal Range: Negative)

## 2010-02-20 NOTE — Progress Notes (Signed)
Summary: MAMMOGRAM  Phone Note Call from Patient   Summary of Call: PATIENT CALLED WANTED TO KNOW COULD DR. Lodema Hong SEND HER TO GET HER MAMMOGRAM AT [facility] Initial call taken by: Eugenio Hoes,  January 06, 2010 2:12 PM  Follow-up for Phone Call        due pls sched and let her know Follow-up by: Syliva Overman MD,  January 06, 2010 6:47 PM  Additional Follow-up for Phone Call Additional follow up Details #1::        tried to call patient and advised them of their appt for Jan. 6, 2012 register at 9:45, sent letter out after unable to leave a msg. Additional Follow-up by: Curtis Sites,  January 07, 2010 9:26 AM  New Problems: OTHER SCREENING MAMMOGRAM (ICD-V76.12)   New Problems: OTHER SCREENING MAMMOGRAM (ICD-V76.12)

## 2010-02-20 NOTE — Assessment & Plan Note (Signed)
Summary: F UP AND RECTAL   Vital Signs:  Patient profile:   65 year old female Menstrual status:  hysterectomy Height:      69 inches Weight:      211.75 pounds O2 Sat:      100 % on Room air Pulse rate:   95 / minute Resp:     16 per minute BP sitting:   140 / 82  (left arm) Cuff size:   regular  Vitals Entered By: Mauricia Area CMA (November 06, 2009 9:11 AM)  O2 Flow:  Room air CC: follow up   CC:  follow up.  History of Present Illness: Reports  that she has been doing fairly well. Denies recent fever or chills. Denies sinus pressure, nasal congestion , ear pain or sore throat. Denies chest congestion, or cough productive of sputum. Denies chest pain, palpitations, PND, orthopnea or leg swelling. Denies abdominal pain, nausea, vomitting, diarrhea or constipation. Denies change in bowel movements or bloody stool. Denies dysuria , frequency, incontinence or hesitancy.  Denies headaches, vertigo, seizures. Denies depression, anxiety or insomnia. Denies  rash, lesions, or itch.     Current Medications (verified): 1)  Adult Aspirin Ec Low Strength 81 Mg  Tbec (Aspirin) .... Take One Tablet By Mouth Once A Day 2)  Phenazopyridine Hcl 200 Mg  Tabs (Phenazopyridine Hcl) .... Take One Tablet By Mouth As Needed 3)  Oscal 500/200 D-3 500-200 Mg-Unit  Tabs (Calcium-Vitamin D) .... Take One Tablet By Mouth Twice A Day 4)  Womens One Daily   Tabs (Multiple Vitamins-Minerals) .... One Tab By Mouth Once Daily 5)  Dicyclomine Hcl 10 Mg  Caps (Dicyclomine Hcl) .... One Cap By Mouth Three Times A Day 6)  Ranitidine Hcl 150 Mg  Tabs (Ranitidine Hcl) .... One Tab By Mouth Once Daily 7)  Docusate Sodium 100 Mg Tabs (Docusate Sodium) .... Take 1 Tab By Mouth Once Daily As Needed 8)  Simvastatin 10 Mg Tabs (Simvastatin) .... One Tab By Mouth At Bedtime. 9)  Fish Oil 1000 Mg Caps (Omega-3 Fatty Acids) .... Use As Directed 10)  Amlodipine Besylate 10 Mg Tabs (Amlodipine Besylate) .... Take  1 Tablet By Mouth Once A Day 11)  Benazepril Hcl 40 Mg Tabs (Benazepril Hcl) .... One Tab By Mouth Once Daily 12)  Ibuprofen 200 Mg .... As Needed  Allergies (verified): 1)  ! Levaquin 2)  ! Pcn 3)  ! Sulfa 4)  ! Macrobid  Review of Systems      See HPI General:  Complains of fatigue. Eyes:  Complains of vision loss-both eyes; denies discharge and red eye. GI:  Complains of abdominal pain; denies constipation, diarrhea, nausea, and vomiting; gerd, conmtrolled with meds. MS:  Complains of joint pain, low back pain, mid back pain, and stiffness; chronic. Endo:  Denies excessive thirst, excessive urination, and weight change. Heme:  Denies abnormal bruising and bleeding. Allergy:  Complains of seasonal allergies.  Physical Exam  General:  alert, well-hydrated, and obese HEENT: No facial asymmetry,  EOMI, No sinus tenderness, TM's Clear, oropharynx  pink and moist. Necvk supple, no jVD , no adenopathy  Chest: Clear to auscultation bilaterally.  CVS: S1, S2, No murmurs, No S3.   Abd: Soft, Nontender. No renal angle tenderness MS: Adequate ROM spine, hips, shoulders and knees. though reduced Ext: No edema.   CNS: CN 2-12 intact, power tone and sensation normal throughout.   Skin: Intact,  Psych: Good eye contact, normal affect.  Memory intact, not anxious  or depressed appearing.    Impression & Recommendations:  Problem # 1:  HYPERLIPIDEMIA (ICD-272.4) Assessment Comment Only  The following medications were removed from the medication list:    Simvastatin 10 Mg Tabs (Simvastatin) ..... One tab by mouth qhs Her updated medication list for this problem includes:    Lovastatin 20 Mg Tabs (Lovastatin) .Marland Kitchen... Take 1 tab by mouth at bedtime Low fat dietdiscussed and encouraged  Labs Reviewed: SGOT: 14 (06/10/2009)   SGPT: 13 (06/10/2009)   HDL:44 (06/10/2009), 46 (10/19/2008)  LDL:105 (06/10/2009), 109 (10/19/2008)  Chol:162 (06/10/2009), 168 (10/19/2008)  Trig:63 (06/10/2009), 65  (10/19/2008)  Problem # 2:  OBESITY, UNSPECIFIED (ICD-278.00) Assessment: Unchanged  Ht: 69 (11/06/2009)   Wt: 211.75 (11/06/2009)   BMI: 31.83 (06/07/2009) therapeutic lifestyle change discussed and encouraged  Problem # 3:  ESSENTIAL HYPERTENSION, BENIGN (ICD-401.1) Assessment: Deteriorated  Her updated medication list for this problem includes:    Amlodipine Besylate 10 Mg Tabs (Amlodipine besylate) .Marland Kitchen... Take 1 tablet by mouth once a day    Benazepril Hcl 40 Mg Tabs (Benazepril hcl) ..... One tab by mouth once daily . Patient advised to follow low sodium diet rich in fruit and vegetables, and to commit to at least 30 minutes 5 days per week of regular exercise , to improve blood presure control.   Orders: T-Basic Metabolic Panel [id number])  BP today: 140/82 Prior BP: 120/86 (06/07/2009)  Labs Reviewed: K+: 4.0 (06/10/2009) Creat: : 0.86 (06/10/2009)   Chol: 162 (06/10/2009)   HDL: 44 (06/10/2009)   LDL: 105 (06/10/2009)   TG: 63 (06/10/2009)  Problem # 4:  GERD (ICD-530.81) Assessment: Comment Only  Her updated medication list for this problem includes:    Dicyclomine Hcl 10 Mg Caps (Dicyclomine hcl) ..... One cap by mouth three times a day    Ranitidine Hcl 150 Mg Tabs (Ranitidine hcl) ..... One tab by mouth once daily  Complete Medication List: 1)  Adult Aspirin Ec Low Strength 81 Mg Tbec (Aspirin) .... Take one tablet by mouth once a day 2)  Phenazopyridine Hcl 200 Mg Tabs (Phenazopyridine hcl) .... Take one tablet by mouth as needed 3)  Oscal 500/200 D-3 500-200 Mg-unit Tabs (Calcium-vitamin d) .... Take one tablet by mouth twice a day 4)  Womens One Daily Tabs (Multiple vitamins-minerals) .... One tab by mouth once daily 5)  Dicyclomine Hcl 10 Mg Caps (Dicyclomine hcl) .... One cap by mouth three times a day 6)  Ranitidine Hcl 150 Mg Tabs (Ranitidine hcl) .... One tab by mouth once daily 7)  Docusate Sodium 100 Mg Tabs (Docusate sodium) .... Take 1 tab by mouth  once daily as needed 8)  Fish Oil 1000 Mg Caps (Omega-3 fatty acids) .... Use as directed 9)  Amlodipine Besylate 10 Mg Tabs (Amlodipine besylate) .... Take 1 tablet by mouth once a day 10)  Benazepril Hcl 40 Mg Tabs (Benazepril hcl) .... One tab by mouth once daily 11)  Ibuprofen 200 Mg  .... As needed 12)  Lovastatin 20 Mg Tabs (Lovastatin) .... Take 1 tab by mouth at bedtime 13)  Oscal 500/200 D-3 500-200 Mg-unit Tabs (Calcium-vitamin d) .... Take 1 tablet by mouth three times a day  Other Orders: T-Hepatic Function [id number]) T-Lipid Profile [phone]) T-TSH [phone]) Influenza Vaccine MCR [id number]) Medicare Electronic Prescription [id number])  Patient Instructions: 1)  CPE in 4 months 2)  Flu vaccine today 3)  BMP prior to visit, ICD-9: 4)  Hepatic Panel prior to visit, ICD-9:  fasting today. 5)  Lipid  Panel prior to visit, ICD-9: 6)  TSH prior to visit, ICD-9: 7)  PLS STOP sIMVASTATIN and start new cholesterol med lovastatin.Marland KitchenPLS take both blood pressure pills at the same time together every day, 9am 8)  It is important that you exercise regularly at least 20 minutes 5 times a week. If you develop chest pain, have severe difficulty breathing, or feel very tired , stop exercising immediately and seek medical attention. 9)  You need to lose weight. Consider a lower calorie diet and regular exercise.  10)  The medication list was reviewed and reconciled..All changed/newly prescribed medications were explained. A complete medication list was provided to the patient/caregiver.  Prescriptions: RANITIDINE HCL 150 MG  TABS (RANITIDINE HCL) one tab by mouth once daily  #90 x 3   Entered by:   Adella Hare LPN   Authorized by:   Syliva Overman MD   Signed by:   Adella Hare LPN on 03/47/4259   Method used:   Faxed to ...       [facility] (mail-order)       [address]       [place], [place]  [id number]       Ph: [phone]       Fax: [phone]   RxID:    [id number] DICYCLOMINE HCL 10 MG  CAPS (DICYCLOMINE HCL) one cap by mouth three times a day  #270 x 3   Entered by:   Adella Hare LPN   Authorized by:   Syliva Overman MD   Signed by:   Adella Hare LPN on 07/12/7626   Method used:   Faxed to ...       [facility] (mail-order)       [address]       [place], [place]  [id number]       Ph: [phone]       Fax: [phone]   RxID:   [id number] OSCAL 500/200 D-3 500-200 MG-UNIT TABS (CALCIUM-VITAMIN D) Take 1 tablet by mouth three times a day  #270 x 3   Entered and Authorized by:   Syliva Overman MD   Signed by:   Syliva Overman MD on 11/06/2009   Method used:   Printed then faxed to ...       [facility] (mail-order)       [address]       [place], [place]  [id number]       Ph: [phone]       Fax: [phone]   RxID:   [id number] LOVASTATIN 20 MG TABS (LOVASTATIN) Take 1 tab by mouth at bedtime  #90 x 1   Entered and Authorized by:   Syliva Overman MD   Signed by:   Syliva Overman MD on 11/06/2009   Method used:   Printed then faxed to ...       [facility] (mail-order)       [address]       [place], [place]  [id number]       Ph: [phone]       Fax: [phone]   RxID:   [id number]    Orders Added: 1)  T-Basic Metabolic Panel [id number] 2)  T-Hepatic Function [phone] 3)  T-Lipid Profile [[id number]] 4)  T-TSH [[id number]] 5)  Influenza Vaccine MCR [00025] 6)  Est. Patient Level IV [18563] 7)  Medicare Electronic Prescription [[id number]]   Immunizations Administered:  Influenza Vaccine # 1:    Vaccine Type: Fluvax  MCR    Site: left deltoid    Mfr: novartis    Dose: 0.5 ml    Route: IM    Given by: Adella Hare LPN    Exp. Date: 05/2009    Lot #: [id number]    VIS given: 08/13/09 version given November 06, 2009.   Immunizations Administered:  Influenza Vaccine # 1:    Vaccine Type: Fluvax MCR    Site: left deltoid     Mfr: novartis    Dose: 0.5 ml    Route: IM    Given by: Adella Hare LPN    Exp. Date: 05/2009    Lot #: [id number]    VIS given: 08/13/09 version given November 06, 2009.

## 2010-02-20 NOTE — Letter (Signed)
Summary: order form  order form   Imported By: Lind Guest 06/27/2009 09:27:49  _____________________________________________________________________  External Attachment:    Type:   Image     Comment:   External Document

## 2010-02-20 NOTE — Assessment & Plan Note (Signed)
Summary: office visit   Vital Signs:  Patient profile:   65 year old female Menstrual status:  hysterectomy Height:      69 inches Weight:      216.25 pounds BMI:     32.05 O2 Sat:      97 % Pulse rate:   96 / minute Pulse rhythm:   regular Resp:     16 per minute BP sitting:   120 / 82  (left arm) Cuff size:   large  Vitals Entered By: Everitt Amber (March 05, 2009 11:24 AM)  Nutrition Counseling: Patient's BMI is greater than 25 and therefore counseled on weight management options. CC: Follow up chronic problems, woke up today with a stomach ache Is Patient Diabetic? No   CC:  Follow up chronic problems and woke up today with a stomach ache.  History of Present Illness: Reports  that she ias generally doing well. Denies recent fever or chills. Denies sinus pressure, nasal congestion , ear pain or sore throat. Denies chest congestion, or cough productive of sputum. Denies chest pain, palpitations, PND, orthopnea or leg swelling. Denies abdominal pain, nausea, vomitting, diarrhea or constipation. Denies change in bowel movements or bloody stool.  Denies  joint pain, swelling, or reduced mobility. Denies headaches, vertigo, seizures. Denies depression, anxiety or insomnia. Denies  rash, lesions, or itch.     Current Medications (verified): 1)  Adult Aspirin Ec Low Strength 81 Mg  Tbec (Aspirin) .... Take One Tablet By Mouth Once A Day 2)  Phenazopyridine Hcl 200 Mg  Tabs (Phenazopyridine Hcl) .... Take One Tablet By Mouth Three Times A Day 3)  Oscal 500/200 D-3 500-200 Mg-Unit  Tabs (Calcium-Vitamin D) .... Take One Tablet By Mouth Twice A Day 4)  Womens One Daily   Tabs (Multiple Vitamins-Minerals) .... One Tab By Mouth Once Daily 5)  Dicyclomine Hcl 10 Mg  Caps (Dicyclomine Hcl) .... One Cap By Mouth Three Times A Day 6)  Ranitidine Hcl 150 Mg  Tabs (Ranitidine Hcl) .... One Tab By Mouth Once Daily 7)  Docusate Sodium 100 Mg Tabs (Docusate Sodium) .... Take 1 Tab By  Mouth Once Daily As Needed 8)  Astepro 137 Mcg/spray Soln (Azelastine Hcl) .... Take 2 Puffs in Each Nostril As Needed 9)  Simvastatin 10 Mg Tabs (Simvastatin) .... One Tab By Mouth Qhs 10)  Fish Oil 1000 Mg Caps (Omega-3 Fatty Acids) .... Use As Directed 11)  Vitamin D 400 Unit Tabs (Cholecalciferol) .... Take 1 Tablet By Mouth Once A Day 12)  Amlodipine Besylate 10 Mg Tabs (Amlodipine Besylate) .... Take 1 Tablet By Mouth Once A Day 13)  Betamethasone Valerate 0.1 % Crea (Betamethasone Valerate) .... Apply To Affected Area Twice Daily As Needed 14)  Clobetasol Propionate 0.05 % Soln (Clobetasol Propionate) .... Uad 15)  Benazepril Hcl 40 Mg Tabs (Benazepril Hcl) .... One Tab By Mouth Once Daily  Allergies (verified): 1)  ! Levaquin 2)  ! Pcn 3)  ! Sulfa 4)  ! Macrobid  Review of Systems      See HPI Eyes:  Denies blurring and discharge. GU:  Complains of dysuria and urinary frequency; 2 day history . Endo:  Denies cold intolerance, excessive hunger, excessive thirst, excessive urination, heat intolerance, polyuria, and weight change. Allergy:  Complains of seasonal allergies.  Physical Exam  General:  alert, well-hydrated, and overweight-appearing HEENT: No facial asymmetry,  EOMI, No sinus tenderness, TM's Clear, oropharynx  pink and moist.   Chest: Clear to auscultation bilaterally.  CVS: S1, S2, No murmurs, No S3.   Abd: Soft, Nontender. No renal angle tenderness MS: Adequate ROM spine, hips, shoulders and knees.  Ext: No edema.   CNS: CN 2-12 intact, power tone and sensation normal throughout.   Skin: Intact, no visible lesions or rashes.  Psych: Good eye contact, normal affect.  Memory intact, not anxious or depressed appearing.    Impression & Recommendations:  Problem # 1:  INTERSTITIAL CYSTITIS (ICD-595.1) Assessment Deteriorated CCUA done and c/s senet, awaiting result to prescribe abiotic  Problem # 2:  HYPERLIPIDEMIA (ICD-272.4) Assessment: Comment  Only  Her updated medication list for this problem includes:    Simvastatin 10 Mg Tabs (Simvastatin) ..... One tab by mouth qhs  Labs Reviewed: SGOT: 18 (10/19/2008)   SGPT: 17 (10/19/2008)   HDL:46 (10/19/2008), 39 (04/10/2008)  LDL:109 (10/19/2008), 138 (04/10/2008)  Chol:168 (10/19/2008), 196 (04/10/2008)  Trig:65 (10/19/2008), 96 (04/10/2008)  Problem # 3:  OBESITY, UNSPECIFIED (ICD-278.00) Assessment: Unchanged  Ht: 69 (03/05/2009)   Wt: 216.25 (03/05/2009)   BMI: 32.05 (03/05/2009)  Problem # 4:  ESSENTIAL HYPERTENSION, BENIGN (ICD-401.1) Assessment: Unchanged  Her updated medication list for this problem includes:    Amlodipine Besylate 10 Mg Tabs (Amlodipine besylate) .Marland Kitchen... Take 1 tablet by mouth once a day    Benazepril Hcl 40 Mg Tabs (Benazepril hcl) ..... One tab by mouth once daily  BP today: 120/82 Prior BP: 118/80 (10/31/2008)  Labs Reviewed: K+: 4.9 (10/19/2008) Creat: : 0.87 (10/19/2008)   Chol: 168 (10/19/2008)   HDL: 46 (10/19/2008)   LDL: 109 (10/19/2008)   TG: 65 (10/19/2008)  Complete Medication List: 1)  Adult Aspirin Ec Low Strength 81 Mg Tbec (Aspirin) .... Take one tablet by mouth once a day 2)  Phenazopyridine Hcl 200 Mg Tabs (Phenazopyridine hcl) .... Take one tablet by mouth three times a day 3)  Oscal 500/200 D-3 500-200 Mg-unit Tabs (Calcium-vitamin d) .... Take one tablet by mouth twice a day 4)  Womens One Daily Tabs (Multiple vitamins-minerals) .... One tab by mouth once daily 5)  Dicyclomine Hcl 10 Mg Caps (Dicyclomine hcl) .... One cap by mouth three times a day 6)  Ranitidine Hcl 150 Mg Tabs (Ranitidine hcl) .... One tab by mouth once daily 7)  Docusate Sodium 100 Mg Tabs (Docusate sodium) .... Take 1 tab by mouth once daily as needed 8)  Astepro 137 Mcg/spray Soln (Azelastine hcl) .... Take 2 puffs in each nostril as needed 9)  Simvastatin 10 Mg Tabs (Simvastatin) .... One tab by mouth qhs 10)  Fish Oil 1000 Mg Caps (Omega-3 fatty acids) ....  Use as directed 11)  Vitamin D 400 Unit Tabs (Cholecalciferol) .... Take 1 tablet by mouth once a day 12)  Amlodipine Besylate 10 Mg Tabs (Amlodipine besylate) .... Take 1 tablet by mouth once a day 13)  Betamethasone Valerate 0.1 % Crea (Betamethasone valerate) .... Apply to affected area twice daily as needed 14)  Clobetasol Propionate 0.05 % Soln (Clobetasol propionate) .... Uad 15)  Benazepril Hcl 40 Mg Tabs (Benazepril hcl) .... One tab by mouth once daily 16)  Ciprofloxacin Hcl 500 Mg Tabs (Ciprofloxacin hcl) .... Take 1 tablet by mouth two times a day  Other Orders: UA Dipstick W/ Micro (manual) ([id number]) T-Culture, Urine ([id number])  Patient Instructions: 1)  Please schedule a follow-up appointment in 3.5 months. 2)  It is important that you exercise regularly at least 20 minutes 5 times a week. If you develop chest pain, have severe difficulty breathing, or  feel very tired , stop exercising immediately and seek medical attention. 3)  You need to lose weight. Consider a lower calorie diet and regular exercise.  4)  BMP prior to visit, ICD-9: 5)  Hepatic Panel prior to visit, ICD-9:  fasting in 3 .5 months 6)  Lipid Panel prior to visit, ICD-9: Prescriptions: CIPROFLOXACIN HCL 500 MG TABS (CIPROFLOXACIN HCL) Take 1 tablet by mouth two times a day  #14 x 0   Entered and Authorized by:   Syliva Overman MD   Signed by:   Syliva Overman MD on 03/11/2009   Method used:   Electronically to        [facility]* (retail)       [address]       [place], [place]  [address]       Ph: [phone]       Fax: [phone]   RxID:   [id number] BENAZEPRIL HCL 40 MG TABS (BENAZEPRIL HCL) one tab by mouth once daily  #90 x 0   Entered by:   Adella Hare LPN   Authorized by:   Syliva Overman MD   Signed by:   Adella Hare LPN on 44/01/270   Method used:   Handwritten   RxID:   [id number] AMLODIPINE BESYLATE 10 MG TABS (AMLODIPINE BESYLATE) Take 1 tablet by  mouth once a day  #90 x 0   Entered by:   Adella Hare LPN   Authorized by:   Syliva Overman MD   Signed by:   Adella Hare LPN on 38/75/6433   Method used:   Handwritten   RxID:   [id number] SIMVASTATIN 10 MG TABS (SIMVASTATIN) one tab by mouth qhs  #90 x 0   Entered by:   Adella Hare LPN   Authorized by:   Syliva Overman MD   Signed by:   Adella Hare LPN on 01/27/3233   Method used:   Handwritten   RxID:   [id number] RANITIDINE HCL 150 MG  TABS (RANITIDINE HCL) one tab by mouth once daily  #30 x 3   Entered by:   Adella Hare LPN   Authorized by:   Syliva Overman MD   Signed by:   Adella Hare LPN on 62/83/1517   Method used:   Handwritten   RxID:   [id number]     Laboratory Results   Urine Tests  Date/Time Received: March 05, 2009  Date/Time Reported: March 05, 2009   Routine Urinalysis   Color: yellow Appearance: Clear Glucose: negative   (Normal Range: Negative) Bilirubin: negative   (Normal Range: Negative) Ketone: negative   (Normal Range: Negative) Spec. Gravity: 1.015   (Normal Range: 1.003-1.035) Blood: negative   (Normal Range: Negative) pH: 6.5   (Normal Range: 5.0-8.0) Protein: negative   (Normal Range: Negative) Urobilinogen: 0.2   (Normal Range: 0-1) Nitrite: negative   (Normal Range: Negative) Leukocyte Esterace: trace   (Normal Range: Negative)

## 2010-02-20 NOTE — Letter (Signed)
Summary: appt for mammo  appt for mammo   Imported By: Lind Guest 01/14/2010 12:49:35  _____________________________________________________________________  External Attachment:    Type:   Image     Comment:   External Document

## 2010-02-20 NOTE — Progress Notes (Signed)
Summary: MEDICINE  Phone Note Call from Patient   Summary of Call: DID NOT SEND HER NEW  CHOL.  MEDICINE NEEDS IT SEND TO [facility] Initial call taken by: Lind Guest,  November 15, 2009 11:41 AM    Prescriptions: LOVASTATIN 20 MG TABS (LOVASTATIN) Take 1 tab by mouth at bedtime  #90 x 1   Entered by:   Adella Hare LPN   Authorized by:   Syliva Overman MD   Signed by:   Adella Hare LPN on 16/10/9602   Method used:   Faxed to ...       [facility] (mail-order)       [address]       [place], [place]  [id number]       Ph: [phone]       Fax: [phone]   RxID:   [id number]

## 2010-02-20 NOTE — Progress Notes (Signed)
Summary: benazepril, and amlodipine  Phone Note Call from Patient   Summary of Call: patient called and said she had called [facility] last week about her amlodipine and benazepril, she states that she is out of both of these medications.  I saw where her benazepril was called into [facility], but she states she wants them called into [facility].   Initial call taken by: Curtis Sites,  January 07, 2010 3:14 PM    Prescriptions: AMLODIPINE BESYLATE 10 MG TABS (AMLODIPINE BESYLATE) Take 1 tablet by mouth once a day  #90 x 0   Entered by:   Adella Hare LPN   Authorized by:   Syliva Overman MD   Signed by:   Adella Hare LPN on 16/10/9602   Method used:   Faxed to ...       [facility] (mail-order)       [address]       [place], [place]  [id number]       Ph: [phone]       Fax: [phone]   RxID:   [id number]  BENAZEPRIL ALREADY SENT TO [facility]

## 2010-03-11 ENCOUNTER — Encounter: Payer: Self-pay | Admitting: Family Medicine

## 2010-03-11 ENCOUNTER — Ambulatory Visit (INDEPENDENT_AMBULATORY_CARE_PROVIDER_SITE_OTHER): Payer: Medicare PPO | Admitting: Family Medicine

## 2010-03-11 DIAGNOSIS — E669 Obesity, unspecified: Secondary | ICD-10-CM

## 2010-03-11 DIAGNOSIS — E785 Hyperlipidemia, unspecified: Secondary | ICD-10-CM

## 2010-03-11 DIAGNOSIS — I10 Essential (primary) hypertension: Secondary | ICD-10-CM

## 2010-03-11 LAB — CONVERTED CEMR LAB
ALT: 19 units/L (ref 0–35)
AST: 19 units/L (ref 0–37)
BUN: 11 mg/dL (ref 6–23)
Bilirubin, Direct: 0.1 mg/dL (ref 0.0–0.3)
Calcium: 9.5 mg/dL (ref 8.4–10.5)
Cholesterol: 159 mg/dL (ref 0–200)
Glucose, Bld: 82 mg/dL (ref 70–99)
Indirect Bilirubin: 0.2 mg/dL (ref 0.0–0.9)
LDL Cholesterol: 103 mg/dL — ABNORMAL HIGH (ref 0–99)
Potassium: 4.1 meq/L (ref 3.5–5.3)
VLDL: 13 mg/dL (ref 0–40)

## 2010-03-14 ENCOUNTER — Ambulatory Visit (INDEPENDENT_AMBULATORY_CARE_PROVIDER_SITE_OTHER): Payer: Medicare PPO | Admitting: Urology

## 2010-03-14 ENCOUNTER — Encounter: Payer: Self-pay | Admitting: Family Medicine

## 2010-03-14 DIAGNOSIS — N3010 Interstitial cystitis (chronic) without hematuria: Secondary | ICD-10-CM

## 2010-03-14 DIAGNOSIS — R300 Dysuria: Secondary | ICD-10-CM

## 2010-03-14 DIAGNOSIS — N8111 Cystocele, midline: Secondary | ICD-10-CM

## 2010-03-14 DIAGNOSIS — N952 Postmenopausal atrophic vaginitis: Secondary | ICD-10-CM

## 2010-03-18 NOTE — Assessment & Plan Note (Signed)
Summary: OFFICE VISIT   Vital Signs:  Patient profile:   65 year old female Menstrual status:  hysterectomy Height:      69 inches Weight:      211.50 pounds BMI:     31.35 O2 Sat:      97 % Pulse rate:   91 / minute Pulse rhythm:   regular Resp:     16 per minute BP sitting:   138 / 88  (left arm) Cuff size:   large  Vitals Entered By: Everitt Amber LPN (March 11, 2010 10:38 AM)  Nutrition Counseling: Patient's BMI is greater than 25 and therefore counseled on weight management options. CC: Follow up chronic problems   CC:  Follow up chronic problems.  History of Present Illness: Reports  that  she ahs eben fairly well. Denies recent fever or chills. Denies sinus pressure, nasal congestion , ear pain or sore throat. Denies chest congestion, or cough productive of sputum. Denies chest pain, palpitations, PND, orthopnea or leg swelling. Denies abdominal pain, nausea, vomitting, diarrhea or constipation. Denies change in bowel movements or bloody stool. Denies dysuria , frequency, incontinence or hesitancy. Denies  joint pain, swelling, or reduced mobility. Denies headaches, vertigo, seizures. Denies depression, anxiety or insomnia. Denies  rash, lesions, or itch.     Current Medications (verified): 1)  Adult Aspirin Ec Low Strength 81 Mg  Tbec (Aspirin) .... Take One Tablet By Mouth Once A Day 2)  Phenazopyridine Hcl 200 Mg  Tabs (Phenazopyridine Hcl) .... Take One Tablet By Mouth As Needed 3)  Oscal 500/200 D-3 500-200 Mg-Unit  Tabs (Calcium-Vitamin D) .... Take One Tablet By Mouth Twice A Day 4)  Dicyclomine Hcl 10 Mg  Caps (Dicyclomine Hcl) .... One Cap By Mouth Three Times A Day 5)  Ranitidine Hcl 150 Mg  Tabs (Ranitidine Hcl) .... One Tab By Mouth Once Daily 6)  Docusate Sodium 100 Mg Tabs (Docusate Sodium) .... Take 1 Tab By Mouth Once Daily As Needed 7)  Fish Oil 1000 Mg Caps (Omega-3 Fatty Acids) .... Use As Directed 8)  Amlodipine Besylate 10 Mg Tabs  (Amlodipine Besylate) .... Take 1 Tablet By Mouth Once A Day 9)  Benazepril Hcl 40 Mg Tabs (Benazepril Hcl) .... One Tab By Mouth Once Daily 10)  Ibuprofen 200 Mg .... As Needed 11)  Lovastatin 20 Mg Tabs (Lovastatin) .... Take 1 Tab By Mouth At Bedtime 12)  Oscal 500/200 D-3 500-200 Mg-Unit Tabs (Calcium-Vitamin D) .... Take 1 Tablet By Mouth Three Times A Day 13)  Vita-Plus E 400 Unit Caps (Vitamin E) .... Take 1 Tablet By Mouth Once A Day  Allergies (verified): 1)  ! Levaquin 2)  ! Pcn 3)  ! Sulfa 4)  ! Macrobid  Review of Systems      See HPI Eyes:  Denies double vision, eye pain, and red eye. Resp:  Complains of cough and sputum productive; productive cough 1 month ago, asymptomatic now. MS:  Complains of joint pain and stiffness; increased joint pain affecting the fingers with gangliopn cystsd on both hands, wants no intervention, just meds,pain in left groin when she walks too fast, has bulging disc in her back. Endo:  Denies cold intolerance, excessive hunger, excessive thirst, excessive urination, and heat intolerance. Heme:  Denies abnormal bruising and bleeding. Allergy:  Complains of seasonal allergies; denies hives or rash and itching eyes.  Physical Exam  General:  alert, well-hydrated, and obese HEENT: No facial asymmetry,  EOMI, No sinus tenderness, TM's Clear,  oropharynx  pink and moist. Necvk supple, no jVD , no adenopathy  Chest: Clear to auscultation bilaterally.  CVS: S1, S2, No murmurs, No S3.   Abd: Soft, Nontender. No renal angle tenderness MS: Adequate ROM spine, hips, shoulders and knees. though reduced Ext: No edema.   CNS: CN 2-12 intact, power tone and sensation normal throughout.   Skin: Intact,  Psych: Good eye contact, normal affect.  Memory intact, not anxious or depressed appearing.    Impression & Recommendations:  Problem # 1:  GERD (ICD-530.81) Assessment Unchanged  Her updated medication list for this problem includes:    Dicyclomine  Hcl 10 Mg Caps (Dicyclomine hcl) ..... One cap by mouth three times a day    Ranitidine Hcl 150 Mg Tabs (Ranitidine hcl) ..... One tab by mouth once daily  Problem # 2:  HYPERLIPIDEMIA (ICD-272.4) Assessment: Comment Only  Her updated medication list for this problem includes:    Lovastatin 20 Mg Tabs (Lovastatin) .Marland Kitchen... Take 1 tab by mouth at bedtime Low fat dietdiscussed and encouraged  Orders: Medicare Electronic Prescription [phone]) T-Hepatic Function [phone]) T-Lipid Profile [phone])  Labs Reviewed: SGOT: 14 (11/06/2009)   SGPT: 14 (11/06/2009)   HDL:41 (11/06/2009), 44 (06/10/2009)  LDL:123 (11/06/2009), 105 (06/10/2009)  Chol:180 (11/06/2009), 162 (06/10/2009)  Trig:79 (11/06/2009), 63 (06/10/2009)  Problem # 3:  ESSENTIAL HYPERTENSION, BENIGN (ICD-401.1) Assessment: Unchanged  Her updated medication list for this problem includes:    Amlodipine Besylate 10 Mg Tabs (Amlodipine besylate) .Marland Kitchen... Take 1 tablet by mouth once a day    Benazepril Hcl 40 Mg Tabs (Benazepril hcl) ..... One tab by mouth once daily Patient advised to follow low sodium diet rich in fruit and vegetables, and to commit to at least 30 minutes 5 days per week of regular exercise , to improve blood presure control.   Orders: T-Basic Metabolic Panel [phone])  BP today: 138/88 Prior BP: 140/82 (11/06/2009)  Labs Reviewed: K+: 4.3 (11/06/2009) Creat: : 0.92 (11/06/2009)   Chol: 180 (11/06/2009)   HDL: 41 (11/06/2009)   LDL: 123 (11/06/2009)   TG: 79 (11/06/2009)  Problem # 4:  OBESITY, UNSPECIFIED (ICD-278.00) Assessment: Deteriorated  Ht: 69 (03/11/2010)   Wt: 211.50 (03/11/2010)   BMI: 31.35 (03/11/2010) therapeutic lifestyle change discussed and encouraged  Complete Medication List: 1)  Adult Aspirin Ec Low Strength 81 Mg Tbec (Aspirin) .... Take one tablet by mouth once a day 2)  Phenazopyridine Hcl 200 Mg Tabs (Phenazopyridine hcl) .... Take one tablet by mouth as needed 3)  Oscal  500/200 D-3 500-200 Mg-unit Tabs (Calcium-vitamin d) .... Take one tablet by mouth twice a day 4)  Dicyclomine Hcl 10 Mg Caps (Dicyclomine hcl) .... One cap by mouth three times a day 5)  Ranitidine Hcl 150 Mg Tabs (Ranitidine hcl) .... One tab by mouth once daily 6)  Docusate Sodium 100 Mg Tabs (Docusate sodium) .... Take 1 tab by mouth once daily as needed 7)  Fish Oil 1000 Mg Caps (Omega-3 fatty acids) .... Use as directed 8)  Amlodipine Besylate 10 Mg Tabs (Amlodipine besylate) .... Take 1 tablet by mouth once a day 9)  Benazepril Hcl 40 Mg Tabs (Benazepril hcl) .... One tab by mouth once daily 10)  Ibuprofen 200 Mg  .... As needed 11)  Lovastatin 20 Mg Tabs (Lovastatin) .... Take 1 tab by mouth at bedtime 12)  Oscal 500/200 D-3 500-200 Mg-unit Tabs (Calcium-vitamin d) .... Take 1 tablet by mouth three times a day 13)  Vita-plus E 400 Unit  Caps (Vitamin e) .... Take 1 tablet by mouth once a day 14)  Meloxicam 15 Mg Tabs (Meloxicam) .... Take 1 tablet by mouth once a day as needed for arthritic pain  Patient Instructions: 1)  Please schedule a cPE in 4 months. 2)  It is important that you exercise regularly at least 20 minutes 5 times a week. If you develop chest pain, have severe difficulty breathing, or feel very tired , stop exercising immediately and seek medical attention. 3)  You need to lose weight. Consider a lower calorie diet and regular exercise.  4)  BMP prior to visit, ICD-9: 5)  Hepatic Panel prior to visit, ICD-9: 6)  Lipid Panel prior to visit, ICD-9: 7)  pls eat more veg, fruit, white meat and water, less processed foods Prescriptions: LOVASTATIN 20 MG TABS (LOVASTATIN) Take 1 tab by mouth at bedtime  #90 x 1   Entered by:   Everitt Amber LPN   Authorized by:   Syliva Overman MD   Signed by:   Everitt Amber LPN on 54/09/8117   Method used:   Faxed to ...       [facility] (mail-order)             , [place]         Ph: [phone]       Fax: [phone]   RxID:    [id number] DICYCLOMINE HCL 10 MG  CAPS (DICYCLOMINE HCL) one cap by mouth three times a day  #270 x 1   Entered by:   Everitt Amber LPN   Authorized by:   Syliva Overman MD   Signed by:   Everitt Amber LPN on 72/53/6644   Method used:   Faxed to ...       [facility] (mail-order)             , [place]         Ph: [phone]       Fax: [phone]   RxID:   [id number] MELOXICAM 15 MG TABS (MELOXICAM) Take 1 tablet by mouth once a day as needed for arthritic pain  #30 x 3   Entered and Authorized by:   Syliva Overman MD   Signed by:   Syliva Overman MD on 03/11/2010   Method used:   Electronically to        [facility]* (retail)       [address]       [place], [place]  [address]       Ph: [phone]       Fax: [phone]   RxID:   [id number]    Orders Added: 1)  Est. Patient Level IV [69485] 2)  Medicare Electronic Prescription [G8553] 3)  T-Basic Metabolic Panel [[id number]] 4)  T-Hepatic Function [[id number]] 5)  T-Lipid Profile [38182-99371]

## 2010-03-25 ENCOUNTER — Telehealth: Payer: Self-pay | Admitting: Family Medicine

## 2010-03-25 ENCOUNTER — Encounter: Payer: Self-pay | Admitting: Family Medicine

## 2010-03-27 NOTE — Letter (Signed)
[facility]   Imported By: Lind Guest 03/18/2010 09:14:07  _____________________________________________________________________  External Attachment:    Type:   Image     Comment:   External Document

## 2010-04-01 NOTE — Letter (Signed)
[facility]   Imported By: Lind Guest 03/25/2010 16:52:06  _____________________________________________________________________  External Attachment:    Type:   Image     Comment:   External Document

## 2010-04-01 NOTE — Progress Notes (Signed)
  Phone Note Other Incoming   Caller: dr simpson Summary of Call: pls let pt know dicyclomine is not covered and I am starting omeprazole in it's place and send in script, thanks Initial call taken by: Syliva Overman MD,  March 25, 2010 12:42 PM  Follow-up for Phone Call        Patient aware Follow-up by: Everitt Amber LPN,  March 25, 2010 12:52 PM

## 2010-04-09 ENCOUNTER — Other Ambulatory Visit: Payer: Self-pay | Admitting: Family Medicine

## 2010-04-09 MED ORDER — AMLODIPINE BESYLATE 10 MG PO TABS: 10.0000 mg | ORAL_TABLET | Freq: Every day | ORAL | Status: DC

## 2010-04-09 MED ORDER — BENAZEPRIL HCL 40 MG PO TABS: 40.0000 mg | ORAL_TABLET | Freq: Every day | ORAL | Status: DC

## 2010-04-09 NOTE — Telephone Encounter (Signed)
Patient uses [facility] and would like these two medications

## 2010-04-18 ENCOUNTER — Ambulatory Visit (INDEPENDENT_AMBULATORY_CARE_PROVIDER_SITE_OTHER): Payer: Medicare PPO | Admitting: Urology

## 2010-04-18 DIAGNOSIS — N952 Postmenopausal atrophic vaginitis: Secondary | ICD-10-CM

## 2010-04-18 DIAGNOSIS — N3010 Interstitial cystitis (chronic) without hematuria: Secondary | ICD-10-CM

## 2010-06-06 NOTE — H&P (Signed)
[facility]  Patient:    Angela Simmons, Angela Simmons Visit Number: [id number] MRN: [id number]          Service Type: END Location: DAY Attending Physician:  Dessa Phi Dictated by:   Elpidio Anis, M.D. Admit Date:  05/25/2001                           History and Physical  HISTORY OF PRESENT ILLNESS:  The patient is a 65 year old female with a history of progressive constipation with heavy laxative use in the past two months.  She developed small, stringy stools.  She denies rectal bleeding or melena.  There is no family history of colon cancer  PAST MEDICAL HISTORY:  She has a history of breast cancer, hypertension, depression, interstitial cystitis, chronic cholecystitis.  MEDICATIONS: 1. Pyridium Plus t.i.d. 2. Darvocet-N q.i.d. p.r.n. 3. Aciphex 20 mg q.d. 4. Elmiron 100 mg 2 tablets b.i.d. 5. Detrol LA 4 mg q.d. 6. Effexor XR 150 mg 2 q.a.m. 7. Diovan 12.5 q.d. 8. Atenolol 50 mg b.i.d. 9. Aspirin 81 mg q.d.  PHYSICAL EXAMINATION:  VITAL SIGNS:  Blood pressure 133/80, pulse 72, respirations 18.  Weight 197 pounds.  GENERAL:  She is in no acute distress.  HEENT:  Unremarkable.  NECK:  Supple without JVD or bruit.  CHEST:  Clear to auscultation.  BREASTS:  Unremarkable except for absent right breast.  ABDOMEN:  Soft, nontender, no masses.  RECTAL:  Normal, no masses.  Stool guaiac negative.  EXTREMITIES:  No cyanosis, clubbing, or edema.  NEUROLOGIC:  No motor, sensory, or cerebellar deficit.  IMPRESSION: 1. Chronic constipation with change in bowel habits. 2. Hypertension 3. Interstitial cystitis. 4. History of breast cancer. 5. Depression.  PLAN:  The patient is scheduled for screening colonoscopy. Dictated by:   Elpidio Anis, M.D. Attending Physician:  Dessa Phi DD:  05/31/01 TD:  05/31/01 Job: [id number]

## 2010-06-06 NOTE — Op Note (Signed)
   NAME:  Angela Simmons, Angela Simmons                         ACCOUNT NO.:  [id number]   MEDICAL RECORD NO.:  [id number]                   PATIENT TYPE:  AMB   LOCATION:  DAY                                  FACILITY:  [facility]   PHYSICIAN:  Dennie Maizes, M.D.                DATE OF BIRTH:  1946-01-05   DATE OF PROCEDURE:  04/26/2002  DATE OF DISCHARGE:                                 OPERATIVE REPORT   PREOPERATIVE DIAGNOSIS:  Voiding difficulty, interstitial cystitis.   POSTOPERATIVE DIAGNOSIS:  Voiding difficulty, interstitial cystitis,  ureteral stenosis.   OPERATIVE PROCEDURE:  Cystoscopy, ureteral dilation, and hydrodistention of  the bladder.   ANESTHESIA:  General.   SURGEON:  Dennie Maizes, M.D.   COMPLICATIONS:  None.   INDICATIONS FOR PROCEDURE:  This 65 year old female had urinary frequency,  suprapubic pain, nocturia, and difficulty with voiding.  She has a past  history of interstitial cystitis.  She has responded to hydrodistention of  the bladder in the past.  She was taken to the OR today for cystoscopy,  possible ureteral dilation, and hydrodistention of the bladder.   DESCRIPTION OF PROCEDURE:  General anesthesia was induced, and the patient  was placed on the OR table in the dorsal lithotomy position.  Examination  revealed a moderate cystocele.  __________ 20 [place].  Dilation was done up  to 32 [place].  Cystoscope was done with the 20 [place] scope.  The trigone,  ureteral orifices, and bladder mucosa were normal.  There was slight  hyperemia of the posterior wall of the bladder.  The bladder was filled with  sterile water.  The patient had glomerulations as well as mild hematuria  after this.  There was no evidence of any bladder tumor or foreign body.  The distention of the bladder was done at hydrostatic pressure.  This was  kept for about 10 minutes.  The bladder capacity was 500 cc.  After the  bladder distention, the patient had mild hematuria and glomerulations  of the  posterior wall of the bladder.  This was suggestive of interstitial  cystitis.  A second hydrodistention of the bladder was done.  The bladder  capacity was 550 cc.  The instruments were removed.  The patient was  transferred to the PACU in satisfactory condition.                                               Dennie Maizes, M.D.    SK/MEDQ  D:  04/26/2002  T:  04/26/2002  Job:  [id number]   cc:   Lodema Hong, M.D.

## 2010-06-06 NOTE — Op Note (Signed)
Angela Simmons, LELAND NO.:  [id number]   MEDICAL RECORD NO.:  [id number]          PATIENT TYPE:  AMB   LOCATION:  [facility]                         FACILITY:  [facility]   PHYSICIAN:  Jamison Neighbor, M.D.  DATE OF BIRTH:  July 08, 1945   DATE OF PROCEDURE:  07/17/2005  DATE OF DISCHARGE:                                 OPERATIVE REPORT   PREOPERATIVE DIAGNOSES:  1.  Interstitial cystitis.  2.  Hematuria.  3.  Positive NMP 22.   POSTOPERATIVE DIAGNOSIS:  1.  Interstitial cystitis.  2.  Hematuria.  3.  Positive NMP 22.   PROCEDURE:  1.  Cystoscopy.  2.  Bilateral retrogrades with interpretation.  3.  Hydrodistention of the bladder.  4.  Bladder biopsy.  5.  Marcaine and pyridium instillation and marcaine and kenalog injections.   SURGEON:  Jamison Neighbor, M.D.   ANESTHESIA:  None.   COMPLICATIONS:  None.   DRAINS:  None.   ADDENDUM:  The patient underwent bilateral retrogrades.  A #6 French  ureteral catheter was inserted into the left orifice.  A retrograde study  was performed.  The ureter was of normal caliber.  The collection system was  quite unremarkable.  There were finely cupped calices throughout.  No  filling defects or other irregularities could be seen.  The right ureter was  then cannulated with the same #6 French ureteral catheter.  The retrograde  study also showed a normal caliber ureter without filling defects or  obstruction.  This kidney appeared to be slightly mal-rotated and an upper  pole was never quite as well seen.  It was thought that perhaps this meant  the patient had a duplicated system, but careful inspection of the trigone  area did not reveal a second ureter.  It should be noted that even with  modest filling, this bladder bled significantly from the interstitial  cystitis, and it is possible that an ectopic ureter may not  have been seen.  The patient will require additional evaluation with a CT  scan to be sure the upper pole  segment, if present, is not abnormal.  The  drain-out studies appeared unremarkable.  The patient successfully underwent  the remainder of her planned cystoscopy, hydrodistention and biopsy.           ______________________________  Jamison Neighbor, M.D.  Electronically Signed     RJE/MEDQ  D:  07/17/2005  T:  07/17/2005  Job:  [id number]

## 2010-06-06 NOTE — Op Note (Signed)
. [facility]  Patient:    Angela Simmons, Angela Simmons                      MRN: [id number] Proc. Date: 04/15/00 Adm. Date:  [id number] Attending:  Lindaann Slough                           Operative Report  PREOPERATIVE DIAGNOSIS:  Interstitial cystitis.  POSTOPERATIVE DIAGNOSIS:  Interstitial cystitis.  PROCEDURE DONE:  Cystoscopy, hydraulic bladder distention and urethral dilation.  SURGEON:  Lindaann Slough, M.D.  ANESTHESIA:  General.  INDICATIONS:  Patient is a 65 year old female, who has a history of interstitial cystitis.  She has been complaining of frequency ______ discomfort.  She was treated with antibiotics and analgesics without any improvement.  She is scheduled today for cystoscopy and hydraulic bladder distention.  DESCRIPTION OF PROCEDURE:  Under general anesthesia, patient was prepped and draped and placed in the dorsal lithotomy position.  A #21 Wappler cystoscope was inserted in the bladder.  The bladder mucosa was reddened.  There was no stone or tumor in the bladder.  The ureteral orifices were in normal position and shape with clear efflux.  There was evidence of submucosal hemorrhage. The bladder was then distended with normal saline with 400 cc for 10 minutes. the cystoscope was then removed.  The urethra was then dilated with #30-French.  One ampule of Xylocaine jelly was then instilled in the bladder.  Bimanual examination showed no evidence of pelvic mass.  Patient is status post hysterectomy.  The patient tolerated the procedure well and left the OR in satisfactory condition to post anesthesia care unit. DD:  04/15/00 TD:  04/15/00 Job: [id number]

## 2010-06-06 NOTE — Op Note (Signed)
NAMEZARRA, Angela Simmons NO.:  [id number]   MEDICAL RECORD NO.:  [id number]          PATIENT TYPE:  AMB   LOCATION:  [facility]                         FACILITY:  [facility]   PHYSICIAN:  Jamison Neighbor, M.D.  DATE OF BIRTH:  [birth year]   DATE OF PROCEDURE:  07/17/2005  DATE OF DISCHARGE:                                 OPERATIVE REPORT   PREOPERATIVE DIAGNOSES:  1.  Interstitial cystitis.  2.  Hematuria.  3.  Positive NMP-22.   POSTOPERATIVE DIAGNOSES:  1.  Interstitial cystitis.  2.  Hematuria.  3.  Positive NMP-22.   PROCEDURE:  Cystoscopy, hydro-distention of the bladder, bilateral  retrogrades with interpretation, Marcaine and Pyridium installation,  Marcaine and Kenalog injection, bladder biopsy.   SURGEON:  Jamison Neighbor, M.D.   ANESTHESIA:  General anesthesia.   COMPLICATIONS:  None.   DRAINS:  None.   INDICATIONS FOR PROCEDURE:  This 65 year old female is known to have a  severe case of interstitial cystitis.  In addition, she has had problems  with hematuria.  Out of concern that we might be missing carcinoma in situ,  an NMP-22 test was sent.  This came back positive.  The patient is now to  undergo a cystoscopy, retrograde bladder biopsy and evaluation of the NMP-  22.  She will also undergo a hydro-distention at the same time.  The patient  understands the risks and benefits of the procedure and gave a full informed  consent.   DESCRIPTION OF PROCEDURE:  After the successful induction of general  anesthesia, the patient was placed in the dorsal lithotomy position and  prepped with Betadine and draped in the usual sterile fashion.  A cystoscopy  is performed with both the 12-degree and the 70-degree lenses.  No tumors or  stones could be seen.  It should be noted, however, that as the bladder  filled with even modest amounts of urine, the patient began to bleed with  classic glomerulation formation, even without formal hydro-distention.  Clearly  the patient had an extremely small bladder and the hematuria may  very well be simply due to over-filling of this small contracted bladder.  Retrograde studies performed on the left-hand side, followed by the right-  hand side.  A separate paragraph will be used to describe those operative  findings.   After the retrogrades had been completed and interpreted, a hydro-distention  was performed.  The bladder was distended at a pressure of 170 degrees of  water for five minutes.  When the bladder was drained, glomerulations could  be seen throughout the bladder, consistent with interstitial cystitis, and a  significant cascade of blood at the end of the drain-out cycle was noted.  The patient had a bladder capacity of just over 350 mL, which is marked  diminished, with normal being 1150 mL on the average, with interstitial  cystitis at 575 mL.  Because of the concern about the NMP-22, a random  bladder biopsy was performed and the biopsy sites were cauterized with  electrocautery.  A mixture of Marcaine and Pyridium was left in the bladder.  Marcaine and Kenalog were injected peri-urethrally.  The patient received  intraoperative Toradol, Zofran and a B&O suppository.   She was taken to the recovery room in good condition.  She will be sent home  with Tylox as well as doxycycline.  She does have Pyridium plus.  She will  return to see me in the office in followup.  We will plan to get a CT scan  of the kidneys, primarily to visualize the right side, only because the  kidneys appear to be perhaps somewhat mal-rotated and an upper pole segment  was not visualized in a normal fashion.           ______________________________  Jamison Neighbor, M.D.  Electronically Signed     RJE/MEDQ  D:  07/17/2005  T:  07/17/2005  Job:  [id number]

## 2010-06-06 NOTE — H&P (Signed)
NAME:  Angela Simmons, Angela Simmons                         ACCOUNT NO.:  [id number]   MEDICAL RECORD NO.:  [id number]                   PATIENT TYPE:  AMB   LOCATION:  DAY                                  FACILITY:  [facility]   PHYSICIAN:  Dennie Maizes, M.D.                DATE OF BIRTH:  1945-03-27   DATE OF ADMISSION:  04/26/2002  DATE OF DISCHARGE:                                HISTORY & PHYSICAL   CHIEF COMPLAINT:  Suprapubic pain, increased urinary frequency, nocturia,  history of interstitial cystitis.   HISTORY OF PRESENT ILLNESS:  The patient is a 65 year old female who has had  irritative urinary symptoms for several years.  Diagnosis of interstitial  cystitis was made in 2001.  The patient responded well to cystoscopy and  hydrodistention of the bladder.  She had been on Imuran and Ditropan XL.  Recently she started having exacerbation of her symptoms.  She complains of  suprapubic pain and discomfort, urinary frequency x8-10 and nocturia x15.  She also has intermittent dysuria.  She has relief of suprapubic pain after  voiding.  She feels she has to strain to empty the bladder as well as  bowels.  There is no history of flank pain.  She also had gross hematuria.   PAST MEDICAL HISTORY:  History of interstitial cystitis status post  cystoscopy and bladder distention in 2001, status post hysterectomy in 1993,  history of carcinoma of the right breast diagnosed and treated in 1995.   MEDICATIONS:  1. Darvocet-N 50 one p.o. q.6h. p.r.n. pain.  2. Imuran 100 mg two p.o. b.i.d.  3. Ditropan XL 15 mg one p.o. once daily.  4. Nasonex 50-mcg spray.  5. Aciphex 20 mg one p.o. once daily.  6. Potassium chloride 20 mEq one p.o. once daily.  7. Atenolol 50 mg one p.o. b.i.d.  8. Levsin 0.125 mg one p.o. before meals and at bedtime.  9. Aspirin 81 mg one p.o. once daily.  10.      Amitriptyline 25 mg one p.o. at bedtime.   ALLERGIES:  PENICILLIN, MACRODANTIN, AND SULFA.   PHYSICAL  EXAMINATION:  HEAD/EYES/EARS/NOSE AND THROAT:  Normal.  NECK:  No masses.  LUNGS:  Clear to auscultation.  HEART:  Regular rate and rhythm; no murmurs.  ABDOMEN:  Soft; no palpable flank mass; no costovertebral angle tenderness;  mild suprapubic tenderness is noted; bladder is not palpable.  PELVIC:  Negative status post hysterectomy.   IMPRESSION:  Urinary frequency, nocturia, interstitial cystitis.    PLAN:  The patient responded well to cystoscopy and hydrodistention of the  bladder about 3 years ago.  She is scheduled to undergo cystoscopy and  hydrodistention of the bladder under general anesthesia at [facility].  I have discussed with her regarding the diagnosis, operative  details, outcome, alternate treatments, possible risks and complications.  She has agreed for the procedure to be done.  Dennie Maizes, M.D.    SK/MEDQ  D:  04/25/2002  T:  04/25/2002  Job:  [id number]   cc:   Milus Mallick. Lodema Hong, M.D.  [address]  [place], [place] [address]  Fax: [phone]

## 2010-06-06 NOTE — H&P (Signed)
[facility]  Patient:    Angela Simmons, Angela Simmons Visit Number: [id number] MRN: [id number]          Service Type: EMS Location: ED Attending Physician:  Corlis Leak. Dictated by:   Elpidio Anis, M.D. Admit Date:  04/19/2001 Discharge Date: 04/19/2001                           History and Physical  HISTORY OF PRESENT ILLNESS:  A 65 year old female with a history of chronic constipation with heavy laxative use over the past two months.  She has no family history of colon cancer.  She denies rectal bleeding or melena.  She has, however, developed very small stringy stools.  She is scheduled for a screening colonoscopy.  PAST HISTORY: 1. She has a history of breast cancer. 2. Hypertension. 3. Depression. 4. Interstitial cystitis. 5. Chronic cholecystitis.  MEDICATIONS: 1. Pyridium Plus t.i.d. 2. Darvocet-N q.i.d. 3. AcipHex 20 mg q.d. 4. Detrol LA 4 mg q.d. 5. Effexor XR 150 mg two q.a.m. 6. Diovan 80/12.5 q.d. 7. Atenolol 50 mg b.i.d. 8. Aspirin 81 mg q.d.  PAST SURGICAL HISTORY: 1. She is status post right mastectomy for breast cancer. 2. She also has had a hysterectomy.  ALLERGIES:  PENICILLIN and SULFA.  PHYSICAL EXAMINATION:  VITAL SIGNS:  Blood pressure 132/80, pulse 72, respirations 18, weight 197 pounds.  HEENT:  Unremarkable.  NECK:  Supple without JVD or bruit.  CHEST:  Clear to auscultation; no rales, rubs, rhonchi or wheezes.  HEART:  Regular rate and rhythm without murmur, gallop or rub.  BREASTS:  Reveals mastectomy scar on the right, left without abnormality.  ABDOMEN:  Soft, nontender, no masses.  RECTAL:  No masses, stool guaiac negative.  EXTREMITIES:  No cyanosis, clubbing or edema.  NEUROLOGIC EXAM:  No focal motor, sensory or cerebellar deficits.  IMPRESSION: 1. A change in bowel habits with progressive constipation. 2. Hypertension. 3. History of breast cancer. 4. Interstitial cystitis. 5.  Depression. 6. Chronic cholecystitis.  PLAN:  The patient will have total colonoscopy. Dictated by:   Elpidio Anis, M.D. Attending Physician:  Corlis Leak DD:  05/24/01 TD:  05/25/01 Job: [id number]

## 2010-07-02 ENCOUNTER — Encounter: Payer: Self-pay | Admitting: Family Medicine

## 2010-07-04 ENCOUNTER — Encounter: Payer: Self-pay | Admitting: Family Medicine

## 2010-07-09 ENCOUNTER — Encounter: Payer: Medicare PPO | Admitting: Family Medicine

## 2010-08-26 ENCOUNTER — Encounter: Payer: Self-pay | Admitting: Family Medicine

## 2010-08-27 ENCOUNTER — Encounter: Payer: Self-pay | Admitting: Family Medicine

## 2010-08-27 ENCOUNTER — Ambulatory Visit (INDEPENDENT_AMBULATORY_CARE_PROVIDER_SITE_OTHER): Payer: Medicare PPO | Admitting: Family Medicine

## 2010-08-27 VITALS — BP 140/90 | HR 84 | Resp 16 | Ht 69.0 in | Wt 213.0 lb

## 2010-08-27 DIAGNOSIS — I889 Nonspecific lymphadenitis, unspecified: Secondary | ICD-10-CM

## 2010-08-27 DIAGNOSIS — R5381 Other malaise: Secondary | ICD-10-CM

## 2010-08-27 DIAGNOSIS — E785 Hyperlipidemia, unspecified: Secondary | ICD-10-CM

## 2010-08-27 DIAGNOSIS — R5383 Other fatigue: Secondary | ICD-10-CM

## 2010-08-27 DIAGNOSIS — M549 Dorsalgia, unspecified: Secondary | ICD-10-CM

## 2010-08-27 DIAGNOSIS — I10 Essential (primary) hypertension: Secondary | ICD-10-CM

## 2010-08-27 DIAGNOSIS — E669 Obesity, unspecified: Secondary | ICD-10-CM

## 2010-08-27 LAB — POCT URINALYSIS DIPSTICK
Blood, UA: NEGATIVE
Ketones, UA: NEGATIVE
Protein, UA: NEGATIVE
Spec Grav, UA: 1.005
Urobilinogen, UA: 0.2
pH, UA: NEGATIVE

## 2010-08-27 MED ORDER — IBUPROFEN 800 MG PO TABS: 800.0000 mg | ORAL_TABLET | Freq: Three times a day (TID) | ORAL | Status: AC | PRN

## 2010-08-27 MED ORDER — AMLODIPINE BESYLATE 10 MG PO TABS: 10.0000 mg | ORAL_TABLET | Freq: Every day | ORAL | Status: DC

## 2010-08-27 MED ORDER — PHENAZOPYRIDINE HCL 200 MG PO TABS: 200.0000 mg | ORAL_TABLET | ORAL | Status: DC | PRN

## 2010-08-27 MED ORDER — PREDNISONE (PAK) 5 MG PO TABS: 5.0000 mg | ORAL_TABLET | ORAL | Status: DC

## 2010-08-27 MED ORDER — CIPROFLOXACIN HCL 500 MG PO TABS: 500.0000 mg | ORAL_TABLET | Freq: Two times a day (BID) | ORAL | Status: AC

## 2010-08-27 MED ORDER — KETOROLAC TROMETHAMINE 30 MG/ML IJ SOLN
60.0000 mg | Freq: Once | INTRAMUSCULAR | Status: AC
  Administered 2010-08-27: 60 mg via INTRAMUSCULAR

## 2010-08-27 MED ORDER — OMEPRAZOLE 20 MG PO CPDR: 20.0000 mg | DELAYED_RELEASE_CAPSULE | Freq: Every day | ORAL | Status: DC

## 2010-08-27 MED ORDER — METHYLPREDNISOLONE ACETATE 80 MG/ML IJ SUSP
80.0000 mg | Freq: Once | INTRAMUSCULAR | Status: AC
  Administered 2010-08-27: 80 mg via INTRAMUSCULAR

## 2010-08-27 MED ORDER — LOVASTATIN 20 MG PO TABS: 20.0000 mg | ORAL_TABLET | Freq: Every day | ORAL | Status: DC

## 2010-08-27 MED ORDER — RANITIDINE HCL 150 MG PO TABS: 150.0000 mg | ORAL_TABLET | Freq: Every day | ORAL | Status: DC

## 2010-08-27 NOTE — Patient Instructions (Addendum)
F/u in 2nd week in January  Call in October for your flu shot  You will get injections and med for the back pain  Going down your left leg,  which is due to bad arthritis irritating a nerve. Call in 10 to 14 days if no better, I will  Refer you to a pain specialist in [place] at that time  Antibiotic is sent for the cysts under your right arm, use cornstarch to keep the skin dry..   Call for sooner appt if you need me.  LABWORK  NEEDS TO BE DONE BETWEEN 3 TO 7 DAYS BEFORE YOUR NEXT SCEDULED  VISIT.  THIS WILL IMPROVE THE QUALITY OF YOUR CARE.   Fasting labs in January.  Start medication for back pain tomorrow

## 2010-08-27 NOTE — Progress Notes (Signed)
  Subjective:    Patient ID: Angela Simmons, female    DOB: 1945-12-20, 65 y.o.   MRN: [id number]  HPI 3 day h/o left buttock pain radiating to posterior to left ankle,denies any numbness, but has been limping with pain. No incontinence  Of stool or urine.  Pt was rearranging a room and moving furniture 4 days ago  And likely aggravated the problem then , she has known arthritis.  Has seen local urologist, replens is helping vag dryness  C/o sore nodules under left breast since ' Using deodorant" 2 weeks ago, now using witch hazel , states squeezed the lumps and white stuff came out some time back   Review of Systems Denies recent fever or chills. Denies sinus pressure, nasal congestion, ear pain or sore throat. Denies chest congestion, productive cough or wheezing. Denies chest pains, palpitations and leg swelling Denies abdominal pain, nausea, vomiting,diarrhea or constipation.   3 day h/o dysuria and frequency, denies flank pain Denies headaches, seizures, numbness, or tingling. Denies depression, anxiety or insomnia. Denies skin break down or rash.        Objective:   Physical Exam Patient alert and oriented and in no cardiopulmonary distress.  HEENT: No facial asymmetry, EOMI, no sinus tenderness,  oropharynx pink and moist.  Neck supple no adenopathy.  Chest: Clear to auscultation bilaterally.  CVS: S1, S2 no murmurs, no S3.  ABD: Soft non tender. Bowel sounds normal.No renal angle or supra pubbic tenderness  Ext: No edema  MS: decreased  ROM spine, shoulders, hips and knees.  Skin: Intact, no ulcerations or rash noted.  Psych: Good eye contact, normal affect. Memory intact not anxious or depressed appearing.  CNS: CN 2-12 intact, power, tone and sensation normal throughout.        Assessment & Plan:

## 2010-08-31 NOTE — Assessment & Plan Note (Signed)
Antibiotics prescribed, and pt advised against shaving , and to keep the area dry

## 2010-08-31 NOTE — Assessment & Plan Note (Signed)
Continue current med, low fat iet encouraged, labs in 2013

## 2010-08-31 NOTE — Assessment & Plan Note (Signed)
Controlled, no change in medication  

## 2010-08-31 NOTE — Assessment & Plan Note (Signed)
Improved. Pt applauded on succesful weight loss through lifestyle change, and encouraged to continue same. Weight loss goal set for the next several months.  

## 2010-08-31 NOTE — Assessment & Plan Note (Signed)
Deteriorated, anti-inflammatories administered

## 2010-10-31 ENCOUNTER — Telehealth: Payer: Self-pay | Admitting: Family Medicine

## 2010-11-10 MED ORDER — RANITIDINE HCL 150 MG PO TABS: 150.0000 mg | ORAL_TABLET | Freq: Every day | ORAL | Status: DC

## 2010-11-10 MED ORDER — AMLODIPINE BESYLATE 10 MG PO TABS: 10.0000 mg | ORAL_TABLET | Freq: Every day | ORAL | Status: DC

## 2010-11-10 MED ORDER — BENAZEPRIL HCL 40 MG PO TABS: 40.0000 mg | ORAL_TABLET | Freq: Every day | ORAL | Status: DC

## 2010-11-10 NOTE — Telephone Encounter (Signed)
meds sent as requested 

## 2010-11-14 ENCOUNTER — Ambulatory Visit (INDEPENDENT_AMBULATORY_CARE_PROVIDER_SITE_OTHER): Payer: Medicare PPO | Admitting: Family Medicine

## 2010-11-14 ENCOUNTER — Encounter: Payer: Self-pay | Admitting: Family Medicine

## 2010-11-14 DIAGNOSIS — E669 Obesity, unspecified: Secondary | ICD-10-CM

## 2010-11-14 DIAGNOSIS — I10 Essential (primary) hypertension: Secondary | ICD-10-CM

## 2010-11-14 DIAGNOSIS — Z23 Encounter for immunization: Secondary | ICD-10-CM

## 2010-11-14 DIAGNOSIS — M549 Dorsalgia, unspecified: Secondary | ICD-10-CM

## 2010-11-14 MED ORDER — KETOROLAC TROMETHAMINE 60 MG/2ML IM SOLN
60.0000 mg | Freq: Once | INTRAMUSCULAR | Status: AC
  Administered 2010-11-14: 60 mg via INTRAMUSCULAR

## 2010-11-14 MED ORDER — PREDNISONE (PAK) 5 MG PO TABS: 5.0000 mg | ORAL_TABLET | ORAL | Status: DC
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
## 2010-11-16 NOTE — Assessment & Plan Note (Signed)
Uncontrolled due to non compliance with medication, importance of same stressed
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
## 2010-11-16 NOTE — Assessment & Plan Note (Signed)
Deteriorated. Patient re-educated about  the importance of commitment to a  minimum of 150 minutes of exercise per week. The importance of healthy food choices with portion control discussed. Encouraged to start a food diary, count calories and to consider  joining a support group. Sample diet sheets offered. Goals set by the patient for the next several months.    

## 2010-11-17 ENCOUNTER — Telehealth: Payer: Self-pay | Admitting: Family Medicine

## 2010-11-17 NOTE — Telephone Encounter (Signed)
Was faxed in this morning

## 2010-11-26 ENCOUNTER — Encounter: Payer: Self-pay | Admitting: Family Medicine

## 2010-11-27 ENCOUNTER — Encounter: Payer: Self-pay | Admitting: Family Medicine

## 2010-11-27 ENCOUNTER — Ambulatory Visit (INDEPENDENT_AMBULATORY_CARE_PROVIDER_SITE_OTHER): Payer: Medicare PPO | Admitting: Family Medicine

## 2010-11-27 VITALS — BP 140/90 | HR 74 | Resp 16 | Ht 69.0 in | Wt 212.0 lb

## 2010-11-27 DIAGNOSIS — E669 Obesity, unspecified: Secondary | ICD-10-CM

## 2010-11-27 DIAGNOSIS — I10 Essential (primary) hypertension: Secondary | ICD-10-CM

## 2010-11-27 DIAGNOSIS — E785 Hyperlipidemia, unspecified: Secondary | ICD-10-CM

## 2010-11-27 DIAGNOSIS — K219 Gastro-esophageal reflux disease without esophagitis: Secondary | ICD-10-CM

## 2010-11-27 DIAGNOSIS — R5383 Other fatigue: Secondary | ICD-10-CM

## 2010-11-27 MED ORDER — CLONIDINE HCL 0.1 MG PO TABS: 0.1000 mg | ORAL_TABLET | Freq: Every day | ORAL | Status: DC

## 2010-11-27 NOTE — Patient Instructions (Addendum)
cPE midddle to end of January.  Fasting labs 1 week before visit.  New additional med, clonidine at bedtime for your blood pressure.  It is important that you exercise regularly at least 30 minutes 5 times a week. If you develop chest pain, have severe difficulty breathing, or feel very tired, stop exercising immediately and seek medical attention    A healthy diet is rich in fruit, vegetables and whole grains. Poultry fish, nuts and beans are a healthy choice for protein rather then red meat. A low sodium diet and drinking 64 ounces of water daily is generally recommended. Oils and sweet should be limited. Carbohydrates especially for those who are diabetic or overweight, should be limited to 30-45 gram per meal. It is important to eat on a regular schedule, at least 3 times daily. Snacks should be primarily fruits, vegetables or nuts.  Weight loss goal of 3 pounds per month

## 2010-11-27 NOTE — Progress Notes (Signed)
  Subjective:    Patient ID: Angela Simmons, female    DOB: 1945/06/04, 65 y.o.   MRN: [id number]  HPI The PT is here for follow up and re-evaluation of chronic medical conditions, medication management and review of any available recent lab and radiology data.  Preventive health is updated, specifically  Cancer screening and Immunization.   Questions or concerns regarding consultations or procedures which the PT has had in the interim are  addressed. The PT denies any adverse reactions to current medications since the last visit.  There are no new concerns.     Review of Systems    See HPI Denies recent fever or chills. Denies sinus pressure, nasal congestion, ear pain or sore throat. Denies chest congestion, productive cough or wheezing. Denies chest pains, palpitations and leg swelling Denies abdominal pain, nausea, vomiting,diarrhea or constipation.   Denies dysuria, frequency, hesitancy or incontinence. Denies headaches, seizures, numbness, or tingling. Denies depression, anxiety or insomnia. Denies skin break down or rash.     Objective:   Physical Exam Patient alert and oriented and in no cardiopulmonary distress.  HEENT: No facial asymmetry, EOMI, no sinus tenderness,  oropharynx pink and moist.  Neck supple no adenopathy.  Chest: Clear to auscultation bilaterally.  CVS: S1, S2 no murmurs, no S3.  ABD: Soft non tender. Bowel sounds normal.  Ext: No edema  WU:[id number] ROM spine, shoulders, hips and knees.  Skin: Intact, no ulcerations or rash noted.  Psych: Good eye contact, normal affect. Memory intact not anxious or depressed appearing.  CNS: CN 2-12 intact, power, tone and sensation normal throughout.        Assessment & Plan:

## 2010-11-30 NOTE — Assessment & Plan Note (Signed)
Hyperlipidemia:Low fat diet discussed and encouraged. No med change , LDL elevated and pt aware

## 2010-11-30 NOTE — Assessment & Plan Note (Signed)
Unchanged. Patient re-educated about  the importance of commitment to a  minimum of 150 minutes of exercise per week. The importance of healthy food choices with portion control discussed. Encouraged to start a food diary, count calories and to consider  joining a support group. Sample diet sheets offered. Goals set by the patient for the next several months.    

## 2010-11-30 NOTE — Assessment & Plan Note (Signed)
Uncontrolled, additional medication started to improve control

## 2010-11-30 NOTE — Assessment & Plan Note (Signed)
Controlled, no change in medication  

## 2011-01-26 ENCOUNTER — Encounter: Payer: Self-pay | Admitting: Family Medicine

## 2011-01-26 ENCOUNTER — Ambulatory Visit (INDEPENDENT_AMBULATORY_CARE_PROVIDER_SITE_OTHER): Payer: Medicare PPO | Admitting: Family Medicine

## 2011-01-26 VITALS — BP 136/82 | HR 76 | Resp 18 | Ht 69.0 in | Wt 213.0 lb

## 2011-01-26 DIAGNOSIS — L309 Dermatitis, unspecified: Secondary | ICD-10-CM

## 2011-01-26 DIAGNOSIS — L259 Unspecified contact dermatitis, unspecified cause: Secondary | ICD-10-CM

## 2011-01-26 DIAGNOSIS — B369 Superficial mycosis, unspecified: Secondary | ICD-10-CM

## 2011-01-26 DIAGNOSIS — B351 Tinea unguium: Secondary | ICD-10-CM

## 2011-01-26 DIAGNOSIS — I10 Essential (primary) hypertension: Secondary | ICD-10-CM

## 2011-01-26 DIAGNOSIS — J309 Allergic rhinitis, unspecified: Secondary | ICD-10-CM | POA: Insufficient documentation

## 2011-01-26 MED ORDER — PREDNISONE (PAK) 5 MG PO TABS: 5.0000 mg | ORAL_TABLET | ORAL | Status: DC

## 2011-01-26 MED ORDER — CLOTRIMAZOLE-BETAMETHASONE 1-0.05 % EX CREA: TOPICAL_CREAM | CUTANEOUS | Status: DC

## 2011-01-26 MED ORDER — BETAMETHASONE DIPROPIONATE 0.05 % EX CREA: TOPICAL_CREAM | Freq: Two times a day (BID) | CUTANEOUS | Status: DC

## 2011-01-26 MED ORDER — HYDROXYZINE HCL 10 MG PO TABS: 10.0000 mg | ORAL_TABLET | Freq: Two times a day (BID) | ORAL | Status: DC | PRN
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
## 2011-01-27 DIAGNOSIS — B369 Superficial mycosis, unspecified: Secondary | ICD-10-CM | POA: Insufficient documentation

## 2011-01-27 NOTE — Assessment & Plan Note (Signed)
Controlled, no change in medication  

## 2011-01-27 NOTE — Assessment & Plan Note (Signed)
Acute flare of erythematous pruritic rash on legs, no evidence of infection , topical steroid prescribed

## 2011-01-27 NOTE — Assessment & Plan Note (Signed)
Fungal rash on back of neck and left arm med prescribed
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
## 2011-01-30 ENCOUNTER — Telehealth: Payer: Self-pay | Admitting: Family Medicine

## 2011-01-30 ENCOUNTER — Other Ambulatory Visit: Payer: Self-pay | Admitting: Family Medicine

## 2011-01-30 DIAGNOSIS — L309 Dermatitis, unspecified: Secondary | ICD-10-CM

## 2011-01-30 DIAGNOSIS — Z139 Encounter for screening, unspecified: Secondary | ICD-10-CM

## 2011-01-30 DIAGNOSIS — B351 Tinea unguium: Secondary | ICD-10-CM

## 2011-01-30 MED ORDER — HYDROXYZINE HCL 10 MG PO TABS: 10.0000 mg | ORAL_TABLET | Freq: Two times a day (BID) | ORAL | Status: AC | PRN

## 2011-01-30 MED ORDER — CLOTRIMAZOLE-BETAMETHASONE 1-0.05 % EX CREA: TOPICAL_CREAM | CUTANEOUS | Status: DC

## 2011-01-30 MED ORDER — BETAMETHASONE DIPROPIONATE 0.05 % EX CREA: TOPICAL_CREAM | Freq: Two times a day (BID) | CUTANEOUS | Status: DC

## 2011-01-30 NOTE — Telephone Encounter (Signed)
Resent to [facility]

## 2011-02-02 ENCOUNTER — Ambulatory Visit (HOSPITAL_COMMUNITY)
Admission: RE | Admit: 2011-02-02 | Discharge: 2011-02-02 | Disposition: A | Discharge status: Home / Self Care | Payer: Medicare HMO | Source: Ambulatory Visit | Attending: Family Medicine | Admitting: Family Medicine

## 2011-02-02 DIAGNOSIS — Z1231 Encounter for screening mammogram for malignant neoplasm of breast: Secondary | ICD-10-CM | POA: Insufficient documentation

## 2011-02-02 DIAGNOSIS — Z139 Encounter for screening, unspecified: Secondary | ICD-10-CM

## 2011-02-27 ENCOUNTER — Other Ambulatory Visit: Payer: Self-pay

## 2011-02-27 MED ORDER — BENAZEPRIL HCL 40 MG PO TABS: 40.0000 mg | ORAL_TABLET | Freq: Every day | ORAL | Status: DC

## 2011-03-19 ENCOUNTER — Other Ambulatory Visit: Payer: Self-pay | Admitting: Physical Medicine and Rehabilitation

## 2011-03-19 DIAGNOSIS — M79605 Pain in left leg: Secondary | ICD-10-CM

## 2011-03-19 DIAGNOSIS — M545 Low back pain: Secondary | ICD-10-CM

## 2011-03-25 ENCOUNTER — Ambulatory Visit
Admission: RE | Admit: 2011-03-25 | Discharge: 2011-03-25 | Disposition: A | Discharge status: Home / Self Care | Payer: Medicare HMO | Source: Ambulatory Visit | Attending: Physical Medicine and Rehabilitation | Admitting: Physical Medicine and Rehabilitation

## 2011-03-25 DIAGNOSIS — M545 Low back pain: Secondary | ICD-10-CM

## 2011-03-25 DIAGNOSIS — M79605 Pain in left leg: Secondary | ICD-10-CM

## 2011-04-23 ENCOUNTER — Other Ambulatory Visit: Payer: Self-pay

## 2011-04-23 ENCOUNTER — Telehealth: Payer: Self-pay | Admitting: Family Medicine

## 2011-04-23 MED ORDER — LOVASTATIN 20 MG PO TABS: 20.0000 mg | ORAL_TABLET | Freq: Every day | ORAL | Status: DC

## 2011-04-23 NOTE — Telephone Encounter (Signed)
Med sent to [facility]  

## 2011-04-27 ENCOUNTER — Other Ambulatory Visit: Payer: Self-pay

## 2011-04-27 DIAGNOSIS — I10 Essential (primary) hypertension: Secondary | ICD-10-CM

## 2011-04-27 MED ORDER — CLONIDINE HCL 0.1 MG PO TABS: 0.1000 mg | ORAL_TABLET | Freq: Every day | ORAL | Status: DC

## 2011-05-05 LAB — HEPATIC FUNCTION PANEL
ALT: 13 U/L (ref 0–35)
AST: 16 U/L (ref 0–37)
Albumin: 4.1 g/dL (ref 3.5–5.2)
Alkaline Phosphatase: 65 U/L (ref 39–117)
Indirect Bilirubin: 0.3 mg/dL (ref 0.0–0.9)
Total Protein: 7.6 g/dL (ref 6.0–8.3)

## 2011-05-05 LAB — CBC WITH DIFFERENTIAL/PLATELET
Basophils Absolute: 0 10*3/uL (ref 0.0–0.1)
Basophils Relative: 0 % (ref 0–1)
Eosinophils Absolute: 0 10*3/uL (ref 0.0–0.7)
Eosinophils Relative: 1 % (ref 0–5)
HCT: 37.5 % (ref 36.0–46.0)
Hemoglobin: 12 g/dL (ref 12.0–15.0)
Lymphocytes Relative: 34 % (ref 12–46)
MCHC: 32 g/dL (ref 30.0–36.0)
MCV: 91.9 fL (ref 78.0–100.0)
Monocytes Relative: 7 % (ref 3–12)
Platelets: 269 10*3/uL (ref 150–400)
RDW: 14.2 % (ref 11.5–15.5)
WBC: 3.5 10*3/uL — ABNORMAL LOW (ref 4.0–10.5)

## 2011-05-05 LAB — BASIC METABOLIC PANEL
BUN: 11 mg/dL (ref 6–23)
Calcium: 9.1 mg/dL (ref 8.4–10.5)
Creat: 0.85 mg/dL (ref 0.50–1.10)
Potassium: 4.2 mEq/L (ref 3.5–5.3)

## 2011-05-05 LAB — LIPID PANEL
Cholesterol: 132 mg/dL (ref 0–200)
LDL Cholesterol: 82 mg/dL (ref 0–99)
Triglycerides: 52 mg/dL (ref <150)
VLDL: 10 mg/dL (ref 0–40)

## 2011-05-05 LAB — TSH: TSH: 2.452 u[IU]/mL (ref 0.350–4.500)

## 2011-05-11 ENCOUNTER — Encounter: Payer: Self-pay | Admitting: Family Medicine

## 2011-05-11 ENCOUNTER — Ambulatory Visit (INDEPENDENT_AMBULATORY_CARE_PROVIDER_SITE_OTHER): Payer: Medicare HMO | Admitting: Family Medicine

## 2011-05-11 ENCOUNTER — Telehealth: Payer: Self-pay

## 2011-05-11 DIAGNOSIS — L0232 Furuncle of buttock: Secondary | ICD-10-CM

## 2011-05-11 DIAGNOSIS — B369 Superficial mycosis, unspecified: Secondary | ICD-10-CM

## 2011-05-11 DIAGNOSIS — L0233 Carbuncle of buttock: Secondary | ICD-10-CM

## 2011-05-11 DIAGNOSIS — Z1211 Encounter for screening for malignant neoplasm of colon: Secondary | ICD-10-CM

## 2011-05-11 DIAGNOSIS — I10 Essential (primary) hypertension: Secondary | ICD-10-CM

## 2011-05-11 DIAGNOSIS — E785 Hyperlipidemia, unspecified: Secondary | ICD-10-CM

## 2011-05-11 DIAGNOSIS — J309 Allergic rhinitis, unspecified: Secondary | ICD-10-CM

## 2011-05-11 MED ORDER — LORATADINE 10 MG PO TABS: 10.0000 mg | ORAL_TABLET | Freq: Every day | ORAL | Status: DC

## 2011-05-11 MED ORDER — DOXYCYCLINE HYCLATE 100 MG PO TABS: 100.0000 mg | ORAL_TABLET | Freq: Two times a day (BID) | ORAL | Status: AC

## 2011-05-11 MED ORDER — FLUTICASONE PROPIONATE 50 MCG/ACT NA SUSP: 2.0000 | Freq: Every day | NASAL | Status: DC

## 2011-05-11 NOTE — Patient Instructions (Addendum)
F/u in 4.5 month. Please call if you need to see me before You are referred for screening colonoscopy.  Medication is sent in for allergies and you will get loratidine script.  Blood pressure and labs are excellent, no medication changes.  Please check with your insurance about the shingles vaccine, you need it.   You will get pneumonia vaccine at next visit since you will be over the age of 40.  It is important that you exercise regularly at least 30 minutes 5 times a week. If you develop chest pain, have severe difficulty breathing, or feel very tired, stop exercising immediately and seek medical attention    A healthy diet is rich in fruit, vegetables and whole grains. Poultry fish, nuts and beans are a healthy choice for protein rather then red meat. A low sodium diet and drinking 64 ounces of water daily is generally recommended. Oils and sweet should be limited. Carbohydrates especially for those who are diabetic or overweight, should be limited to 30-45 gram per meal. It is important to eat on a regular schedule, at least 3 times daily. Snacks should be primarily fruits, vegetables or nuts.  Congrats on weight loss keep it up!  Medication is sent to your local pharmacy for the boil between your buttock which you have had for 3 weeks   Use artificial tears, which are over the counter for dry eyes please

## 2011-05-11 NOTE — Telephone Encounter (Signed)
LMOM at [phone]. ( Also called the pone number of [phone] and person told me it was someone else's number now. So i removed it from the chart.

## 2011-05-11 NOTE — Progress Notes (Signed)
  Subjective:    Patient ID: Angela Simmons, female    DOB: 1945/12/21, 66 y.o.   MRN: [id number]  HPI The PT is here for follow up and re-evaluation of chronic medical conditions, medication management and review of any available recent lab and radiology data.  Preventive health is updated, specifically  Cancer screening and Immunization.   Questions or concerns regarding consultations or procedures which the PT has had in the interim are  addressed. The PT denies any adverse reactions to current medications since the last visit.  Increased allergy symptoms with pollen for the past week. No fever or chills, excessive clear nasal drainage , watery eyes , sneeze and cough     Review of Systems See HPI Denies recent fever or chills. Denies chest congestion, productive cough or wheezing. Denies chest pains, palpitations and leg swelling Denies abdominal pain, nausea, vomiting,diarrhea or constipation.   Denies dysuria, frequency, hesitancy or incontinence. Denies headaches, seizures, numbness, or tingling. Denies depression, anxiety or insomnia. Denies skin break down or rash.       Objective:   Physical Exam Patient alert and oriented and in no cardiopulmonary distress.  HEENT: No facial asymmetry, EOMI, no sinus tenderness,  oropharynx pink and moist.  Neck supple no adenopathy.  Chest: Clear to auscultation bilaterally.  CVS: S1, S2 no murmurs, no S3.  ABD: Soft non tender. Bowel sounds normal.  Ext: No edema  MS: Decreased ROM spine, shoulders, hips and knees.  Skin:infected cyst in buttock  Psych: Good eye contact, normal affect. Memory intact not anxious or depressed appearing.  CNS: CN 2-12 intact, power, tone and sensation normal throughout.        Assessment & Plan:

## 2011-05-12 NOTE — Telephone Encounter (Signed)
Called, LMOM to call. Mailed letter also to call.

## 2011-05-13 ENCOUNTER — Other Ambulatory Visit: Payer: Self-pay

## 2011-05-13 MED ORDER — AMLODIPINE BESYLATE 10 MG PO TABS: 10.0000 mg | ORAL_TABLET | Freq: Every day | ORAL | Status: DC

## 2011-05-25 ENCOUNTER — Telehealth: Payer: Self-pay

## 2011-05-25 NOTE — Telephone Encounter (Signed)
LMOM to call.

## 2011-05-25 NOTE — Telephone Encounter (Signed)
Pt came by the office to schedule colonoscopy. Complains of intermittent abdominal pain. OV scheduled for 05/26/2011 at 10:00 AM with Gerrit Halls, NP.

## 2011-05-26 ENCOUNTER — Ambulatory Visit (INDEPENDENT_AMBULATORY_CARE_PROVIDER_SITE_OTHER): Payer: Medicare HMO | Admitting: Gastroenterology

## 2011-05-26 ENCOUNTER — Encounter: Payer: Self-pay | Admitting: Gastroenterology

## 2011-05-26 DIAGNOSIS — K219 Gastro-esophageal reflux disease without esophagitis: Secondary | ICD-10-CM

## 2011-05-26 DIAGNOSIS — K5900 Constipation, unspecified: Secondary | ICD-10-CM | POA: Insufficient documentation

## 2011-05-26 DIAGNOSIS — R300 Dysuria: Secondary | ICD-10-CM | POA: Insufficient documentation

## 2011-05-26 MED ORDER — PANTOPRAZOLE SODIUM 40 MG PO TBEC: 40.0000 mg | DELAYED_RELEASE_TABLET | Freq: Every day | ORAL | Status: DC

## 2011-05-26 MED ORDER — PEG-KCL-NACL-NASULF-NA ASC-C 100 G PO SOLR: 1.0000 | Freq: Once | ORAL | Status: DC

## 2011-05-26 NOTE — Assessment & Plan Note (Signed)
On Prilosec currently. Taking Zantac daily as well. Intermittent reflux. Question if diet related. No dysphagia, N/V, wt loss, lack of appetite. Will institute dietary modification, change PPI. Needs to work on gradual wt loss as well.  Switch to Protonix daily GERD diet provided Follow-up 3 mos

## 2011-05-26 NOTE — Progress Notes (Signed)
Primary Care Physician:  Syliva Overman, MD, MD Primary Gastroenterologist:  Dr. Darrick Penna  Chief Complaint  Patient presents with  . Abdominal Pain    no N/V, no blood in stool, no diarrhea  . Colonoscopy    HPI:   Angela Simmons is a 66 year old female referred by Dr. Lodema Hong for updated colonoscopy. Last TCS in 2003 by Dr. Katrinka Blazing, normal. Reports lower abdominal discomfort for about a month, intermittent. Usually when waking up from sleeping, has sensation to urinate, some relief thereafter. +intermittent burning with urination. BM once to twice per day, productive. However, does note bouts of constipation. Takes a spoonful of olive oil with good results. Denies diarrhea. No rectal bleeding. Denies wt loss, lack of appetite.   Prilosec daily, Zantac daily. Continued intermittent reflux, no dysphagia or vomiting. Not consistently taking 30 min prior to meals.   TSH normal April 2013.  Hgb normal April 2013.    Past Medical History  Diagnosis Date  . HTN (hypertension)   . Obesity   . Breast cancer 1990s    right breast  . GERD (gastroesophageal reflux disease)     Past Surgical History  Procedure Date  . Mastectomy     right   . Abdominal hysterectomy   . Colonoscopy May 2003    Dr. Katrinka Blazing: normal    Current Outpatient Prescriptions  Medication Sig Dispense Refill  . amLODipine (NORVASC) 10 MG tablet Take 1 tablet (10 mg total) by mouth daily.  90 tablet  1  . aspirin (ADULT ASPIRIN EC LOW STRENGTH) 81 MG EC tablet Take 81 mg by mouth daily. Take one tablet by mouth once a day       . benazepril (LOTENSIN) 40 MG tablet Take 1 tablet (40 mg total) by mouth daily.  90 tablet  1  . betamethasone dipropionate (DIPROLENE) 0.05 % cream Apply topically 2 (two) times daily. Apply sparingly twice daily to rash on l;egs for 10  Days, then stop  45 g  0  . calcium-vitamin D (OSCAL 500/200 D-3) 500 MG tablet Take 1 tablet by mouth 3 (three) times daily. Take 1 tablet by mouth three  times a day       . cloNIDine (CATAPRES) 0.1 MG tablet Take 1 tablet (0.1 mg total) by mouth at bedtime.  90 tablet  1  . clotrimazole-betamethasone (LOTRISONE) cream Apply twice daily to rash on neck , back and arm for 2 weeks , then as needed  45 g  1  . dicyclomine (BENTYL) 10 MG capsule Take 10 mg by mouth 3 (three) times daily. One cap by mouth three a day       . fluticasone (FLONASE) 50 MCG/ACT nasal spray Place 2 sprays into the nose daily.  16 g  2  . loratadine (CLARITIN) 10 MG tablet Take 1 tablet (10 mg total) by mouth daily.  30 tablet  3  . lovastatin (MEVACOR) 20 MG tablet Take 1 tablet (20 mg total) by mouth at bedtime. Take one tab by mouth at bedtime  90 tablet  1  . meloxicam (MOBIC) 15 MG tablet Take 15 mg by mouth daily.        . Multiple Vitamin (DAILY VITAMINS PO) Take by mouth. One tab by mouth once daily        . Omega-3 Fatty Acids (FISH OIL) 1000 MG CAPS Take by mouth. Use as directed        . omeprazole (PRILOSEC) 20 MG capsule Take 1 capsule (20 mg total)  by mouth daily.  90 capsule  1  . ranitidine (ZANTAC) 150 MG tablet Take 1 tablet (150 mg total) by mouth daily. One tab by mouth once daily    90 tablet  1  . doxycycline (VIBRA-TABS) 100 MG tablet Take 1 tablet (100 mg total) by mouth 2 (two) times daily.  20 tablet  0  . pantoprazole (PROTONIX) 40 MG tablet Take 1 tablet (40 mg total) by mouth daily. Take 30 minutes before breakfast daily.  30 tablet  1  . peg 3350 powder (MOVIPREP) 100 G SOLR Take 1 kit (100 g total) by mouth once. As directed Please purchase 1 Fleets enema to use with the prep  1 kit  0  . predniSONE (STERAPRED UNI-PAK) 5 MG TABS Take 1 tablet (5 mg total) by mouth as directed.  21 tablet  0    Allergies as of 05/26/2011 - Review Complete 05/26/2011  Allergen Reaction Noted  . Levofloxacin  02/08/2007  . Nitrofurantoin  02/08/2007  . Penicillins  02/08/2007  . Sulfonamide derivatives  02/08/2007    Family History  Problem Relation Age  of Onset  . Diabetes Brother   . Diabetes Sister   . Diabetes Father   . Diabetes Mother   . Emphysema Father   . Heart failure Mother   . Colon cancer Neg Hx     History   Social History  . Marital Status: Married    Spouse Name: N/A    Number of Children: N/A  . Years of Education: N/A   Occupational History  . Not on file.   Social History Main Topics  . Smoking status: Never Smoker   . Smokeless tobacco: Not on file  . Alcohol Use: No  . Drug Use: No  . Sexually Active: Not on file   Other Topics Concern  . Not on file   Social History Narrative  . No narrative on file    Review of Systems: Gen: Denies any fever, chills, fatigue, weight loss, lack of appetite.  CV: Denies chest pain, heart palpitations, peripheral edema, syncope.  Resp: Denies shortness of breath at rest or with exertion. Denies wheezing or cough.  GI: SEE HPI GU : + burning MS: +back pain, ankle pain Derm: + dry skin Psych: Denies depression, anxiety, memory loss, and confusion Heme: Denies bruising, bleeding, and enlarged lymph nodes.  Physical Exam: BP 121/80  Pulse 76  Temp(Src) 97.6 F (36.4 C) (Temporal)  Ht 5' 9.5" (1.765 m)  Wt 210 lb (95.255 kg)  BMI 30.57 kg/m2 General:   Alert and oriented. Pleasant and cooperative. Well-nourished and well-developed.  Head:  Normocephalic and atraumatic. Eyes:  Without icterus, sclera clear and conjunctiva pink.  Ears:  Normal auditory acuity. Nose:  No deformity, discharge,  or lesions. Mouth:  No deformity or lesions, oral mucosa pink.  Neck:  Supple, without mass or thyromegaly. Lungs:  Clear to auscultation bilaterally. No wheezes, rales, or rhonchi. No distress.  Heart:  S1, S2 present without murmurs appreciated.  Abdomen:  +BS, soft, non-tender and non-distended. No HSM noted. No guarding or rebound. No masses appreciated.  Rectal:  Deferred  Msk:  Symmetrical without gross deformities. Normal posture. Extremities:  Without  clubbing or edema. Neurologic:  Alert and  oriented x4;  grossly normal neurologically. Skin:  Intact without significant lesions or rashes. Cervical Nodes:  No significant cervical adenopathy. Psych:  Alert and cooperative. Normal mood and affect.

## 2011-05-26 NOTE — Assessment & Plan Note (Signed)
Notes lower abdominal discomfort, better after urinating. +burning with urination, no foul odor. Check UA and culture.

## 2011-05-26 NOTE — Progress Notes (Signed)
Faxed to PCP

## 2011-05-26 NOTE — Assessment & Plan Note (Signed)
66 year old female who presents for updated screening colonoscopy. Last normal in 2003 by Dr. Katrinka Blazing. Notes lower abdominal discomfort, intermittent, vague. Intermittent constipation noted, no rectal bleeding. Institute high fiber diet, increase water intake. Likely secondary to dietary factors. No concerning changes to bowel habits. Proceed with TCS in near future with Dr. Darrick Penna.   Proceed with colonoscopy with Dr. Darrick Penna in the near future. The risks, benefits, and alternatives have been discussed in detail with the patient. They state understanding and desire to proceed.  High fiber diet Miralax prn Avoid olive oil to help with BM

## 2011-05-26 NOTE — Patient Instructions (Signed)
Stop taking Prilosec. Start taking Protonix once daily, 30 minutes before breakfast. Review the reflux diet and avoid the foods that can trigger reflux episodes.  Review the high fiber diet as well. Make sure you are drinking 8 glasses of water daily. If needed, you may take Miralax 1 capful for constipation. (This is over-the-counter).  We have set you up for a colonoscopy with Dr. Darrick Penna in the near future.   We will see you back in 3 months.

## 2011-05-27 LAB — URINALYSIS W MICROSCOPIC + REFLEX CULTURE
Casts: NONE SEEN
Crystals: NONE SEEN
Glucose, UA: NEGATIVE mg/dL
Nitrite: NEGATIVE
Specific Gravity, Urine: 1.017 (ref 1.005–1.030)
pH: 7 (ref 5.0–8.0)

## 2011-06-01 NOTE — Assessment & Plan Note (Signed)
Controlled, no change in medication  

## 2011-06-01 NOTE — Assessment & Plan Note (Signed)
Hyperlipidemia:Low fat diet discussed and encouraged.  Controlled, no change in medication   

## 2011-06-01 NOTE — Assessment & Plan Note (Signed)
Antibiotic prescribed 

## 2011-06-02 NOTE — Progress Notes (Signed)
Quick Note:  LMOM to call. ______ 

## 2011-06-02 NOTE — Progress Notes (Signed)
Quick Note:  I have contacted pt due to multiple drug allergies.Need to see what reactions are or if true allergies. ______

## 2011-06-02 NOTE — Progress Notes (Signed)
Quick Note:  Can we contact pt again to see what her reactions are to her allergies? It is difficult to choose an antibiotic for the UTI due to all of her listed allergies. I want to see what reactions she had. I tried to call earlier. This is an FYI in case she calls again, or if you are able to get up with her first. Thanks!   ______

## 2011-06-03 ENCOUNTER — Other Ambulatory Visit: Payer: Self-pay | Admitting: Gastroenterology

## 2011-06-03 ENCOUNTER — Telehealth: Payer: Self-pay

## 2011-06-03 MED ORDER — CIPROFLOXACIN HCL 250 MG PO TABS: 250.0000 mg | ORAL_TABLET | Freq: Two times a day (BID) | ORAL | Status: AC

## 2011-06-03 NOTE — Progress Notes (Signed)
Quick Note:  Called and informed pt. She said she remembers taking Cipro in the past and did well. She will stop it and call if she has any problems. ______

## 2011-06-03 NOTE — Progress Notes (Signed)
Quick Note:  Ok. I looked at her med list from the past, and it appears she has taken Cipro without any problems in the past. Usually, avoid this med if allergic to PCN because can possibly have allergic effects. HOWEVER, since she has taken this in the past, I will send this to her pharmacy.   Please let her know to stop the medication if any allergic reactions noted. Will you also make sure she did well with Cipro or remembers taking this around August last year (it was prescribed for axillary infection at that time by PCP).   Needs to see PCP if continues to have urinary symptoms after abx. I have called in Cipro 250 mg po BID X 3 days. ______

## 2011-06-03 NOTE — Progress Notes (Signed)
Quick Note:  Pt said for the Levofloxacin, macrodantin and penicillin she had hives and itching. For the sulfa, just itching. I will put these in the allergy section also. ______

## 2011-06-03 NOTE — Progress Notes (Signed)
Quick Note:  Great. Thanks! ______

## 2011-06-03 NOTE — Telephone Encounter (Signed)
See separate documentaion.  ( Also phone note with info added).

## 2011-06-04 ENCOUNTER — Telehealth: Payer: Self-pay | Admitting: Family Medicine

## 2011-06-05 ENCOUNTER — Other Ambulatory Visit: Payer: Self-pay

## 2011-06-05 MED ORDER — RANITIDINE HCL 150 MG PO TABS: 150.0000 mg | ORAL_TABLET | Freq: Every day | ORAL | Status: DC

## 2011-06-05 MED ORDER — BENAZEPRIL HCL 40 MG PO TABS: 40.0000 mg | ORAL_TABLET | Freq: Every day | ORAL | Status: DC

## 2011-06-05 NOTE — Telephone Encounter (Signed)
meds refilled 

## 2011-06-08 ENCOUNTER — Telehealth: Payer: Self-pay | Admitting: Gastroenterology

## 2011-06-08 NOTE — Telephone Encounter (Signed)
Called pt. No answer °

## 2011-06-08 NOTE — Telephone Encounter (Signed)
Pt informed

## 2011-06-08 NOTE — Telephone Encounter (Signed)
Called the number requested to call. No answer. Called mobile, LMOM to call.

## 2011-06-08 NOTE — Telephone Encounter (Signed)
Cipro should be completed by now, right?\ She should contact her PCP. Thanks

## 2011-06-08 NOTE — Telephone Encounter (Signed)
Pt called asking if the Cipro she is taking could be causing her feet to swell. Please caller her at [phone]

## 2011-06-18 ENCOUNTER — Encounter (HOSPITAL_COMMUNITY): Payer: Self-pay | Admitting: Pharmacy Technician

## 2011-06-22 ENCOUNTER — Ambulatory Visit (HOSPITAL_COMMUNITY)
Admission: RE | Admit: 2011-06-22 | Discharge: 2011-06-22 | Disposition: A | Discharge status: Home / Self Care | Payer: Medicare HMO | Source: Ambulatory Visit | Attending: Gastroenterology | Admitting: Gastroenterology

## 2011-06-22 ENCOUNTER — Encounter (HOSPITAL_COMMUNITY): Payer: Self-pay | Admitting: *Deleted

## 2011-06-22 ENCOUNTER — Encounter (HOSPITAL_COMMUNITY)
Admission: RE | Disposition: A | Discharge status: Home / Self Care | Payer: Self-pay | Source: Ambulatory Visit | Attending: Gastroenterology

## 2011-06-22 DIAGNOSIS — K648 Other hemorrhoids: Secondary | ICD-10-CM

## 2011-06-22 DIAGNOSIS — Z1211 Encounter for screening for malignant neoplasm of colon: Secondary | ICD-10-CM

## 2011-06-22 DIAGNOSIS — K5730 Diverticulosis of large intestine without perforation or abscess without bleeding: Secondary | ICD-10-CM

## 2011-06-22 DIAGNOSIS — Z79899 Other long term (current) drug therapy: Secondary | ICD-10-CM | POA: Insufficient documentation

## 2011-06-22 DIAGNOSIS — Z7982 Long term (current) use of aspirin: Secondary | ICD-10-CM | POA: Insufficient documentation

## 2011-06-22 DIAGNOSIS — I10 Essential (primary) hypertension: Secondary | ICD-10-CM | POA: Insufficient documentation

## 2011-06-22 HISTORY — PX: COLONOSCOPY: SHX5424

## 2011-06-22 SURGERY — COLONOSCOPY
Anesthesia: Moderate Sedation

## 2011-06-22 MED ORDER — MEPERIDINE HCL 100 MG/ML IJ SOLN
INTRAMUSCULAR | Status: DC | PRN
  Administered 2011-06-22: 50 mg via INTRAVENOUS
  Administered 2011-06-22: 25 mg via INTRAVENOUS

## 2011-06-22 MED ORDER — SODIUM CHLORIDE 0.45 % IV SOLN
Freq: Once | INTRAVENOUS | Status: AC
  Administered 2011-06-22: 10:00:00 via INTRAVENOUS

## 2011-06-22 MED ORDER — STERILE WATER FOR IRRIGATION IR SOLN
Status: DC | PRN
  Administered 2011-06-22: 10:00:00

## 2011-06-22 MED ORDER — MIDAZOLAM HCL 5 MG/5ML IJ SOLN
INTRAMUSCULAR | Status: DC | PRN
  Administered 2011-06-22 (×2): 2 mg via INTRAVENOUS

## 2011-06-22 MED ORDER — MEPERIDINE HCL 100 MG/ML IJ SOLN
INTRAMUSCULAR | Status: AC
  Filled 2011-06-22: qty 1

## 2011-06-22 MED ORDER — MIDAZOLAM HCL 5 MG/5ML IJ SOLN
INTRAMUSCULAR | Status: AC
  Filled 2011-06-22: qty 10

## 2011-06-22 NOTE — H&P (Signed)
Primary Care Physician:  Syliva Overman, MD, MD Primary Gastroenterologist:  Dr. Darrick Penna  Pre-Procedure History & Physical: HPI:  Angela Simmons is a 66 y.o. female here for SCREENING: COLON CA.   Past Medical History  Diagnosis Date  . HTN (hypertension)   . Obesity   . Breast cancer 1990s    right breast  . GERD (gastroesophageal reflux disease)     Past Surgical History  Procedure Date  . Mastectomy     right   . Abdominal hysterectomy   . Colonoscopy May 2003    Dr. Katrinka Blazing: normal    Prior to Admission medications   Medication Sig Start Date End Date Taking? Authorizing Provider  amLODipine (NORVASC) 10 MG tablet Take 1 tablet (10 mg total) by mouth daily. 05/13/11 05/12/12 Yes Kerri Perches, MD  aspirin (ADULT ASPIRIN EC LOW STRENGTH) 81 MG EC tablet Take 81 mg by mouth daily.    Yes Historical Provider, MD  benazepril (LOTENSIN) 40 MG tablet Take 1 tablet (40 mg total) by mouth daily. 06/05/11  Yes Kerri Perches, MD  betamethasone dipropionate (DIPROLENE) 0.05 % cream Apply 1 application topically 2 (two) times daily as needed. Rash 01/30/11 01/30/12 Yes Kerri Perches, MD  calcium-vitamin D (OSCAL 500/200 D-3) 500 MG tablet Take 1 tablet by mouth daily.    Yes Historical Provider, MD  cloNIDine (CATAPRES) 0.1 MG tablet Take 1 tablet (0.1 mg total) by mouth at bedtime. 04/27/11 04/26/12 Yes Kerri Perches, MD  clotrimazole-betamethasone (LOTRISONE) cream Apply 1 application topically 2 (two) times daily as needed. Rash 01/30/11  Yes Kerri Perches, MD  dicyclomine (BENTYL) 10 MG capsule Take 10 mg by mouth 3 (three) times daily.    Yes Historical Provider, MD  fluticasone (FLONASE) 50 MCG/ACT nasal spray Place 2 sprays into the nose daily as needed. Allergies 05/11/11 05/10/12 Yes Kerri Perches, MD  ibuprofen (ADVIL,MOTRIN) 800 MG tablet Take 800 mg by mouth every 8 (eight) hours as needed. Pain   Yes Historical Provider, MD  loratadine (CLARITIN) 10 MG  tablet Take 1 tablet (10 mg total) by mouth daily. 05/11/11 05/10/12 Yes Kerri Perches, MD  lovastatin (MEVACOR) 20 MG tablet Take 20 mg by mouth at bedtime. 04/23/11  Yes Kerri Perches, MD  meloxicam (MOBIC) 15 MG tablet Take 15 mg by mouth daily.     Yes Historical Provider, MD  Multiple Vitamin (DAILY VITAMINS PO) Take by mouth. One tab by mouth once daily     Yes Historical Provider, MD  Omega-3 Fatty Acids (FISH OIL) 1000 MG CAPS Take 1 capsule by mouth daily.    Yes Historical Provider, MD  ranitidine (ZANTAC) 150 MG tablet Take 150 mg by mouth daily. 06/05/11  Yes Kerri Perches, MD  omeprazole (PRILOSEC) 20 MG capsule Take 20 mg by mouth daily.    Historical Provider, MD    Allergies as of 05/26/2011 - Review Complete 05/26/2011  Allergen Reaction Noted  . Levofloxacin  02/08/2007  . Nitrofurantoin  02/08/2007  . Penicillins  02/08/2007  . Sulfonamide derivatives  02/08/2007    Family History  Problem Relation Age of Onset  . Diabetes Brother   . Diabetes Sister   . Diabetes Father   . Diabetes Mother   . Emphysema Father   . Heart failure Mother   . Colon cancer Neg Hx     History   Social History  . Marital Status: Married    Spouse Name: N/A  Number of Children: N/A  . Years of Education: N/A   Occupational History  . Not on file.   Social History Main Topics  . Smoking status: Never Smoker   . Smokeless tobacco: Not on file  . Alcohol Use: No  . Drug Use: No  . Sexually Active: Not on file   Other Topics Concern  . Not on file   Social History Narrative  . No narrative on file    Review of Systems: See HPI, otherwise negative ROS   Physical Exam: BP 135/81  Pulse 71  Temp(Src) 98 F (36.7 C) (Oral)  Resp 18  SpO2 98% General:   Alert,  pleasant and cooperative in NAD Head:  Normocephalic and atraumatic. Neck:  Supple;  Lungs:  Clear throughout to auscultation.    Heart:  Regular rate and rhythm. Abdomen:  Soft, nontender and  nondistended. Normal bowel sounds, without guarding, and without rebound.   Neurologic:  Alert and  oriented x4;  grossly normal neurologically.  Impression/Plan:     SCREENING  Plan:  1. TCS TODAY

## 2011-06-22 NOTE — Op Note (Signed)
[facility] [address] [place], [place]  [address]  COLONOSCOPY PROCEDURE REPORT  PATIENT:  Angela Simmons, Angela Simmons  MR#:  [id number] BIRTHDATE:  18-Apr-1945, 66 yrs. old  GENDER:  female  ENDOSCOPIST:  Jonette Eva, MD REF. BY:  Syliva Overman, M.D. ASSISTANT:  PROCEDURE DATE:  06/22/2011 PROCEDURE:  Colonoscopy 19147  INDICATIONS:  SCREENING  MEDICATIONS:   Demerol 75 mg IV, Versed 4 mg IV  DESCRIPTION OF PROCEDURE:    Physical exam was performed. Informed consent was obtained from the patient after explaining the benefits, risks, and alternatives to procedure.  The patient was connected to monitor and placed in left lateral position. Continuous oxygen was provided by nasal cannula and IV medicine administered through an indwelling cannula.  After administration of sedation and rectal exam, the patient's rectum was intubated and the EC-[id number] ([id number]) colonoscope was advanced under direct visualization to the cecum.  The scope was removed slowly by carefully examining the color, texture, anatomy, and integrity mucosa on the way out.  The patient was recovered in endoscopy and discharged home in satisfactory condition. <<PROCEDUREIMAGES>>  FINDINGS:  FREQUENT Diverticula were found in the sigmoid colon. NO POLYPS. MODERATE Internal Hemorrhoids were found.  PREP QUALITY: GOOD CECAL W/D TIME:    10 minutes  COMPLICATIONS:    None  ENDOSCOPIC IMPRESSION: MODERATE SIGMOID COLON DIVERTICULOSIS MODERATE INTERNAL HEMORRHOIDS  RECOMMENDATIONS: HIGH FIBER DIET AVOID CONSTIPATION. USE BENTYL FOR ABD PAIN OR DIARRHEA. DRINK WATER TCS IN 10 YEARS  REPEAT EXAM:  No  ______________________________ Jonette Eva, MD  CC:  Syliva Overman, M.D.  n. eSIGNED:   Bria Sparr at 06/22/2011 11:40 AM  Beverley Fiedler, [phone]

## 2011-06-22 NOTE — Discharge Instructions (Signed)
You have small internal hemorrhoids and diverticulosis. YOU DID NOT HAVE ANY POLYPS.   ONLY USE BENTYL IF YOU HAVE ABDOMINAL CRAMPS AND/OR DIARRHEA. IT CAN CAUSE CONSTIPATION.  DRINK WATER TO KEEP URINE LIGHT YELLOW.  FOLLOW A HIGH FIBER DIET. AVOID ITEMS THAT CAUSE BLOATING. SEE INFO BELOW.  USE OMEPRAZOLE TO TREAT REFLUX AND PREVENT ULCERS FROM MELXOICAM. USE ZANTAC AS NEEDED FOR BREAKTHROUGH REFLUX SYMPTOMS.  Next colonoscopy in 10 years. Colonoscopy Care After Read the instructions outlined below and refer to this sheet in the next week. These discharge instructions provide you with general information on caring for yourself after you leave the hospital. While your treatment has been planned according to the most current medical practices available, unavoidable complications occasionally occur. If you have any problems or questions after discharge, call DR. Daniel Ritthaler, [phone].  ACTIVITY  You may resume your regular activity, but move at a slower pace for the next 24 hours.   Take frequent rest periods for the next 24 hours.   Walking will help get rid of the air and reduce the bloated feeling in your belly (abdomen).   No driving for 24 hours (because of the medicine (anesthesia) used during the test).   You may shower.   Do not sign any important legal documents or operate any machinery for 24 hours (because of the anesthesia used during the test).    NUTRITION  Drink plenty of fluids.   You may resume your normal diet as instructed by your doctor.   Begin with a light meal and progress to your normal diet. Heavy or fried foods are harder to digest and may make you feel sick to your stomach (nauseated).   Avoid alcoholic beverages for 24 hours or as instructed.    MEDICATIONS  You may resume your normal medications.   WHAT YOU CAN EXPECT TODAY  Some feelings of bloating in the abdomen.   Passage of more gas than usual.   Spotting of blood in your stool or  on the toilet paper  .  IF YOU HAD POLYPS REMOVED DURING THE COLONOSCOPY:  Eat a soft diet IF YOU HAVE NAUSEA, BLOATING, ABDOMINAL PAIN, OR VOMITING.    FINDING OUT THE RESULTS OF YOUR TEST Not all test results are available during your visit. DR. Darrick Penna WILL CALL YOU WITHIN 7 DAYS OF YOUR PROCEDUE WITH YOUR RESULTS. Do not assume everything is normal if you have not heard from DR. Kyndal Heringer IN ONE WEEK, CALL HER OFFICE AT [phone].  SEEK IMMEDIATE MEDICAL ATTENTION AND CALL THE OFFICE: [phone] IF:  You have more than a spotting of blood in your stool.   Your belly is swollen (abdominal distention).   You are nauseated or vomiting.   You have a temperature over 101F.   You have abdominal pain or discomfort that is severe or gets worse throughout the day.   Constipation in Adults Constipation is having fewer than 2 bowel movements per week. Usually, the stools are hard. As we grow older, constipation is more common. If you try to fix constipation with laxatives, the problem may get worse. This is because laxatives taken over a long period of time make the colon muscles weaker. A low-fiber diet, not taking in enough fluids, and taking some medicines may make these problems worse. MEDICATIONS THAT MAY CAUSE CONSTIPATION  Water pills (diuretics).  Calcium channel blockers (used to control blood pressure and for the heart).   Certain pain medicines (narcotics).   Anticholinergics.  Anti-inflammatory agents.  Antacids that contain aluminum.   DISEASES THAT CONTRIBUTE TO CONSTIPATION  Diabetes.  Parkinson's disease.   Dementia.   Stroke.  Depression.   Illnesses that cause problems with salt and water metabolism.   HOME CARE INSTRUCTIONS  Constipation is usually best cared for without medicines. Increasing dietary fiber and eating more fruits and vegetables is the best way to manage constipation.   Slowly increase fiber intake to 25 to 38 grams per day. Whole  grains, fruits, vegetables, and legumes are good sources of fiber. A dietitian can further help you incorporate high-fiber foods into your diet.   Drink enough water and fluids to keep your urine clear or pale yellow.   A fiber supplement may be added to your diet if you cannot get enough fiber from foods.   Increasing your activities also helps improve regularity.   Stronger measures, such as magnesium sulfate, should be avoided if possible. This may cause uncontrollable diarrhea. Using magnesium sulfate may not allow you time to make it to the bathroom.   High-Fiber Diet A high-fiber diet changes your normal diet to include more whole grains, legumes, fruits, and vegetables. Changes in the diet involve replacing refined carbohydrates with unrefined foods. The calorie level of the diet is essentially unchanged. The Dietary Reference Intake (recommended amount) for adult males is 38 grams per day. For adult females, it is 25 grams per day. Pregnant and lactating women should consume 28 grams of fiber per day. Fiber is the intact part of a plant that is not broken down during digestion. Functional fiber is fiber that has been isolated from the plant to provide a beneficial effect in the body. PURPOSE  Increase stool bulk.   Ease and regulate bowel movements.   Lower cholesterol.  INDICATIONS THAT YOU NEED MORE FIBER  Constipation and hemorrhoids.   Uncomplicated diverticulosis (intestine condition) and irritable bowel syndrome.   Weight management.   As a protective measure against hardening of the arteries (atherosclerosis), diabetes, and cancer.   GUIDELINES FOR INCREASING FIBER IN THE DIET  Start adding fiber to the diet slowly. A gradual increase of about 5 more grams (2 slices of whole-wheat bread, 2 servings of most fruits or vegetables, or 1 bowl of high-fiber cereal) per day is best. Too rapid an increase in fiber may result in constipation, flatulence, and bloating.   Drink  enough water and fluids to keep your urine clear or pale yellow. Water, juice, or caffeine-free drinks are recommended. Not drinking enough fluid may cause constipation.   Eat a variety of high-fiber foods rather than one type of fiber.   Try to increase your intake of fiber through using high-fiber foods rather than fiber pills or supplements that contain small amounts of fiber.   The goal is to change the types of food eaten. Do not supplement your present diet with high-fiber foods, but replace foods in your present diet.  INCLUDE A VARIETY OF FIBER SOURCES  Replace refined and processed grains with whole grains, canned fruits with fresh fruits, and incorporate other fiber sources. White rice, white breads, and most bakery goods contain little or no fiber.   Brown whole-grain rice, buckwheat oats, and many fruits and vegetables are all good sources of fiber. These include: broccoli, Brussels sprouts, cabbage, cauliflower, beets, sweet potatoes, white potatoes (skin on), carrots, tomatoes, eggplant, squash, berries, fresh fruits, and dried fruits.   Cereals appear to be the richest source of fiber. Cereal fiber is found in whole grains and bran.  Bran is the fiber-rich outer coat of cereal grain, which is largely removed in refining. In whole-grain cereals, the bran remains. In breakfast cereals, the largest amount of fiber is found in those with "bran" in their names. The fiber content is sometimes indicated on the label.   You may need to include additional fruits and vegetables each day.   In baking, for 1 cup white flour, you may use the following substitutions:   1 cup whole-wheat flour minus 2 tablespoons.   1/2 cup white flour plus 1/2 cup whole-wheat flour.   Diverticulosis Diverticulosis is a common condition that develops when small pouches (diverticula) form in the wall of the colon. The risk of diverticulosis increases with age. It happens more often in people who eat a low-fiber  diet. Most individuals with diverticulosis have no symptoms. Those individuals with symptoms usually experience belly (abdominal) pain, constipation, or loose stools (diarrhea).  HOME CARE INSTRUCTIONS  Increase the amount of fiber in your diet as directed by your caregiver or dietician. This may reduce symptoms of diverticulosis.   Drink at least 6 to 8 glasses of water each day to prevent constipation.   Try not to strain when you have a bowel movement.   Avoiding nuts and seeds to prevent complications is still an uncertain benefit.       FOODS HAVING HIGH FIBER CONTENT INCLUDE:  Fruits. Apple, peach, pear, tangerine, raisins, prunes.   Vegetables. Brussels sprouts, asparagus, broccoli, cabbage, carrot, cauliflower, romaine lettuce, spinach, summer squash, tomato, winter squash, zucchini.   Starchy Vegetables. Baked beans, kidney beans, lima beans, split peas, lentils, potatoes (with skin).   Grains. Whole wheat bread, brown rice, bran flake cereal, plain oatmeal, white rice, shredded wheat, bran muffins.    SEEK IMMEDIATE MEDICAL CARE IF:  You develop increasing pain or severe bloating.   You have an oral temperature above 101F.   You develop vomiting or bowel movements that are bloody or black.   Hemorrhoids Hemorrhoids are dilated (enlarged) veins around the rectum. Sometimes clots will form in the veins. This makes them swollen and painful. These are called thrombosed hemorrhoids. Causes of hemorrhoids include:  Constipation.   Straining to have a bowel movement.   HEAVY LIFTING HOME CARE INSTRUCTIONS  Eat a well balanced diet and drink 6 to 8 glasses of water every day to avoid constipation. You may also use a bulk laxative.   Avoid straining to have bowel movements.   Keep anal area dry and clean.   Do not use a donut shaped pillow or sit on the toilet for long periods. This increases blood pooling and pain.   Move your bowels when your body has the urge;  this will require less straining and will decrease pain and pressure.

## 2011-06-24 ENCOUNTER — Encounter (HOSPITAL_COMMUNITY): Payer: Self-pay | Admitting: Gastroenterology

## 2011-07-14 ENCOUNTER — Telehealth: Payer: Self-pay | Admitting: Family Medicine

## 2011-07-14 DIAGNOSIS — I10 Essential (primary) hypertension: Secondary | ICD-10-CM

## 2011-07-14 MED ORDER — AMLODIPINE BESYLATE 10 MG PO TABS: 10.0000 mg | ORAL_TABLET | Freq: Every day | ORAL | Status: DC

## 2011-07-14 MED ORDER — BENAZEPRIL HCL 40 MG PO TABS: 40.0000 mg | ORAL_TABLET | Freq: Every day | ORAL | Status: DC

## 2011-07-14 MED ORDER — CLONIDINE HCL 0.1 MG PO TABS: 0.1000 mg | ORAL_TABLET | Freq: Every day | ORAL | Status: DC

## 2011-07-14 NOTE — Telephone Encounter (Signed)
I sent the meds that were refilled the last time [facility]. Any other meds that she is missing she can call back with the specific names

## 2011-07-29 NOTE — Progress Notes (Signed)
TCS JUN 2013 TICS/IH

## 2011-08-18 ENCOUNTER — Ambulatory Visit: Payer: Medicare HMO | Admitting: Family Medicine

## 2011-08-18 ENCOUNTER — Encounter: Payer: Self-pay | Admitting: Gastroenterology

## 2011-09-10 ENCOUNTER — Ambulatory Visit: Payer: Medicare HMO | Admitting: Family Medicine

## 2011-09-17 ENCOUNTER — Ambulatory Visit (INDEPENDENT_AMBULATORY_CARE_PROVIDER_SITE_OTHER): Payer: Medicare HMO | Admitting: Family Medicine

## 2011-09-17 ENCOUNTER — Encounter: Payer: Self-pay | Admitting: Family Medicine

## 2011-09-17 VITALS — BP 120/78 | HR 77 | Resp 16 | Ht 69.5 in | Wt 207.0 lb

## 2011-09-17 DIAGNOSIS — K219 Gastro-esophageal reflux disease without esophagitis: Secondary | ICD-10-CM

## 2011-09-17 DIAGNOSIS — M549 Dorsalgia, unspecified: Secondary | ICD-10-CM

## 2011-09-17 DIAGNOSIS — J309 Allergic rhinitis, unspecified: Secondary | ICD-10-CM

## 2011-09-17 DIAGNOSIS — E785 Hyperlipidemia, unspecified: Secondary | ICD-10-CM

## 2011-09-17 DIAGNOSIS — I10 Essential (primary) hypertension: Secondary | ICD-10-CM

## 2011-09-17 DIAGNOSIS — Z23 Encounter for immunization: Secondary | ICD-10-CM

## 2011-09-17 NOTE — Progress Notes (Signed)
  Subjective:    Patient ID: Angela Simmons, female    DOB: 1945-05-23, 66 y.o.   MRN: [id number]  HPI The PT is here for follow up and re-evaluation of chronic medical conditions, medication management and review of any available recent lab and radiology data.  Preventive health is updated, specifically  Cancer screening and Immunization.    The PT denies any adverse reactions to current medications since the last visit.  C/o on going low back pain radiating to right groin and primarily the left lower extremity to the to the toe. Had spontaneous loss of right great toenail recently , she does have fungal nail infection , which is the most likely cause   Review of Systems See HPI Denies recent fever or chills. Denies sinus pressure, nasal congestion, ear pain or sore throat. Denies chest congestion, productive cough or wheezing. Denies chest pains, palpitations and leg swelling Denies abdominal pain, nausea, vomiting,diarrhea or constipation.   Denies dysuria, frequency, hesitancy or incontinence.  Denies headaches, seizures, numbness, or tingling. Denies depression, anxiety or insomnia.       Objective:   Physical Exam Patient alert and oriented and in no cardiopulmonary distress.  HEENT: No facial asymmetry, EOMI, no sinus tenderness,  oropharynx pink and moist.  Neck supple no adenopathy.  Chest: Clear to auscultation bilaterally.  CVS: S1, S2 no murmurs, no S3.  ABD: Soft non tender. Bowel sounds normal.  Ext: No edema  WU:[id number]  ROM spine,adequate in  shoulders, hips and knees.  Skin: Intact, no ulcerations or rash noted.  Psych: Good eye contact, normal affect. Memory intact not anxious or depressed appearing.  CNS: CN 2-12 intact, power, normal throughout.        Assessment & Plan:

## 2011-09-17 NOTE — Patient Instructions (Addendum)
Annual wellness  in December or early January.Please call if you need me before  Flu vaccine available in october  You will likely benefit from epidural injections if your low back and leg pain worsens, you have bad arthritis and bulging discs in your spine.  Please continue to work on weight loss, blood pressure is excellent  Pneumonia vaccine today.  You need a shingles vaccine and will receive a script for this, pls check at your pharmacy  Fasting lipid and cmp 1 week before next visit please

## 2011-09-21 NOTE — Assessment & Plan Note (Signed)
Hyperlipidemia:Low fat diet discussed and encouraged.  Controlled, no change in medication   

## 2011-09-21 NOTE — Assessment & Plan Note (Signed)
Increased and uncontrolled, will likely benefit from epidural, she will call when ready

## 2011-09-21 NOTE — Assessment & Plan Note (Signed)
Controlled, no change in medication  

## 2011-09-21 NOTE — Assessment & Plan Note (Signed)
Controlled, no change in medication DASH diet and commitment to daily physical activity for a minimum of 30 minutes discussed and encouraged, as a part of hypertension management. The importance of attaining a healthy weight is also discussed.  
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
## 2011-10-13 ENCOUNTER — Telehealth: Payer: Self-pay

## 2011-10-13 ENCOUNTER — Other Ambulatory Visit: Payer: Self-pay

## 2011-10-13 NOTE — Telephone Encounter (Signed)
Agree 

## 2011-10-13 NOTE — Telephone Encounter (Signed)
Pt called this morning to see if she could get some samples of Protonix but I told her that we do not have any sample of that. It will be about 14 days before she can get her medication. I told her to take the Prilosec until her Protonix came in.

## 2011-10-14 MED ORDER — PANTOPRAZOLE SODIUM 40 MG PO TBEC: 40.0000 mg | DELAYED_RELEASE_TABLET | Freq: Every day | ORAL | Status: DC

## 2011-10-14 NOTE — Addendum Note (Signed)
Addended by: Joselyn Arrow on: 10/14/2011 09:53 AM   Modules accepted: Orders

## 2011-12-10 ENCOUNTER — Other Ambulatory Visit: Payer: Self-pay

## 2011-12-10 DIAGNOSIS — I10 Essential (primary) hypertension: Secondary | ICD-10-CM

## 2011-12-10 MED ORDER — BENAZEPRIL HCL 40 MG PO TABS: 40.0000 mg | ORAL_TABLET | Freq: Every day | ORAL | Status: DC

## 2011-12-10 MED ORDER — CLONIDINE HCL 0.1 MG PO TABS: 0.1000 mg | ORAL_TABLET | Freq: Every day | ORAL | Status: DC

## 2011-12-10 MED ORDER — PANTOPRAZOLE SODIUM 40 MG PO TBEC: 40.0000 mg | DELAYED_RELEASE_TABLET | Freq: Every day | ORAL | Status: DC

## 2011-12-10 MED ORDER — AMLODIPINE BESYLATE 10 MG PO TABS: 10.0000 mg | ORAL_TABLET | Freq: Every day | ORAL | Status: DC

## 2012-01-21 ENCOUNTER — Other Ambulatory Visit: Payer: Self-pay | Admitting: Family Medicine

## 2012-01-23 LAB — COMPREHENSIVE METABOLIC PANEL
ALT: 13 U/L (ref 0–35)
AST: 13 U/L (ref 0–37)
Albumin: 4.2 g/dL (ref 3.5–5.2)
BUN: 11 mg/dL (ref 6–23)
CO2: 30 mEq/L (ref 19–32)
Calcium: 9.6 mg/dL (ref 8.4–10.5)
Chloride: 103 mEq/L (ref 96–112)
Creat: 0.86 mg/dL (ref 0.50–1.10)
Potassium: 4.4 mEq/L (ref 3.5–5.3)
Total Protein: 7.8 g/dL (ref 6.0–8.3)

## 2012-01-23 LAB — LIPID PANEL: Total CHOL/HDL Ratio: 4.2 Ratio

## 2012-01-26 ENCOUNTER — Ambulatory Visit (INDEPENDENT_AMBULATORY_CARE_PROVIDER_SITE_OTHER): Payer: Medicare Other | Admitting: Family Medicine

## 2012-01-26 ENCOUNTER — Encounter: Payer: Self-pay | Admitting: Family Medicine

## 2012-01-26 VITALS — BP 140/82 | HR 89 | Resp 16 | Ht 69.5 in | Wt 212.4 lb

## 2012-01-26 DIAGNOSIS — K219 Gastro-esophageal reflux disease without esophagitis: Secondary | ICD-10-CM

## 2012-01-26 DIAGNOSIS — R5383 Other fatigue: Secondary | ICD-10-CM

## 2012-01-26 DIAGNOSIS — L03116 Cellulitis of left lower limb: Secondary | ICD-10-CM | POA: Insufficient documentation

## 2012-01-26 DIAGNOSIS — I10 Essential (primary) hypertension: Secondary | ICD-10-CM

## 2012-01-26 DIAGNOSIS — L259 Unspecified contact dermatitis, unspecified cause: Secondary | ICD-10-CM

## 2012-01-26 DIAGNOSIS — E669 Obesity, unspecified: Secondary | ICD-10-CM

## 2012-01-26 DIAGNOSIS — L309 Dermatitis, unspecified: Secondary | ICD-10-CM

## 2012-01-26 DIAGNOSIS — L02419 Cutaneous abscess of limb, unspecified: Secondary | ICD-10-CM

## 2012-01-26 DIAGNOSIS — E785 Hyperlipidemia, unspecified: Secondary | ICD-10-CM

## 2012-01-26 DIAGNOSIS — R5381 Other malaise: Secondary | ICD-10-CM

## 2012-01-26 MED ORDER — DOXYCYCLINE HYCLATE 100 MG PO TABS: 100.0000 mg | ORAL_TABLET | Freq: Two times a day (BID) | ORAL | Status: AC

## 2012-01-26 MED ORDER — CALCIUM CARBONATE-VITAMIN D 500-200 MG-UNIT PO TABS: 1.0000 | ORAL_TABLET | Freq: Two times a day (BID) | ORAL | Status: DC

## 2012-01-26 NOTE — Patient Instructions (Addendum)
Annual wellness in 4 month, call if you need me before.  Please get the flu vaccine today.  Please plan on the shingles vaccine in the next .5 month   Antibiotic for 5 days is prescribed for the left leg, for mild cellulitis  It is important that you exercise regularly at least 30 minutes 5 times a week. If you develop chest pain, have severe difficulty breathing, or feel very tired, stop exercising immediately and seek medical attention     A healthy diet is rich in fruit, vegetables and whole grains. Poultry fish, nuts and beans are a healthy choice for protein rather then red meat. A low sodium diet and drinking 64 ounces of water daily is generally recommended. Oils and sweet should be limited. Carbohydrates especially for those who are diabetic or overweight, should be limited to 30-45 gram per meal. It is important to eat on a regular schedule, at least 3 times daily. Snacks should be primarily fruits, vegetables or nuts.  Weight loss goal of 6 to 8 pounds   Please start calcium with D , it has been prescribed and is good for your health  Reduce fat in your diet, bad cholesterol is high  Fasting chem 7, TSH and CBC in 4.5 months, before next visit

## 2012-01-26 NOTE — Progress Notes (Signed)
  Subjective:    Patient ID: Angela Simmons, female    DOB: October 28, 1945, 67 y.o.   MRN: [id number]  HPI The PT is here for follow up and re-evaluation of chronic medical conditions, medication management and review of any available recent lab and radiology data.  Preventive health is updated, specifically  Cancer screening and Immunization.   Questions or concerns regarding consultations or procedures which the PT has had in the interim are  addressed. The PT denies any adverse reactions to current medications since the last visit.  3 day h/o mild redness, warmth and swelling of the left leg distally, no known trauma , no drainage from the area  Need flu vaccine and plans to get the zostavax later this year, cost is $30      Review of Systems See HPI Denies recent fever or chills. Denies sinus pressure, nasal congestion, ear pain or sore throat. Denies chest congestion, productive cough or wheezing. Denies chest pains, palpitations and leg swelling Denies abdominal pain, nausea, vomiting,diarrhea or constipation.   Denies dysuria, frequency, hesitancy or incontinence. Denies joint pain, swelling and limitation in mobility. Denies headaches, seizures, numbness, or tingling. Denies depression, anxiety or insomnia.       Objective:   Physical Exam Patient alert and oriented and in no cardiopulmonary distress.  HEENT: No facial asymmetry, EOMI, no sinus tenderness,  oropharynx pink and moist.  Neck supple no adenopathy.  Chest: Clear to auscultation bilaterally.  CVS: S1, S2 no murmurs, no S3.  ABD: Soft non tender. Bowel sounds normal.  Ext: No edema  MS: Adequate though reduced  ROM spine, , hips and knees.adequate in shoulders  Skin: Intact, erythema and warmth of left leg, approx area 3inches x 2 inches Psych: Good eye contact, normal affect. Memory intact not anxious or depressed appearing.  CNS: CN 2-12 intact, power, tone and sensation normal throughout.       Assessment & Plan:

## 2012-02-05 ENCOUNTER — Telehealth: Payer: Self-pay | Admitting: Family Medicine

## 2012-02-05 NOTE — Telephone Encounter (Signed)
Called patient and left message for them to return call at the office   

## 2012-02-14 NOTE — Assessment & Plan Note (Signed)
Uncontrolled, with elevated LDL Hyperlipidemia:Low fat diet discussed and encouraged.  No med change

## 2012-02-14 NOTE — Assessment & Plan Note (Signed)
Antibiotic prescribed 

## 2012-02-14 NOTE — Assessment & Plan Note (Signed)
Controlled, no change in medication  

## 2012-02-14 NOTE — Assessment & Plan Note (Signed)
Mild cellulitis left leg, antibiotic prescribed

## 2012-02-14 NOTE — Assessment & Plan Note (Signed)
Unchanged. Patient re-educated about  the importance of commitment to a  minimum of 150 minutes of exercise per week. The importance of healthy food choices with portion control discussed. Encouraged to start a food diary, count calories and to consider  joining a support group. Sample diet sheets offered. Goals set by the patient for the next several months.    

## 2012-02-14 NOTE — Assessment & Plan Note (Signed)
Controlled, no change in medication DASH diet and commitment to daily physical activity for a minimum of 30 minutes discussed and encouraged, as a part of hypertension management. The importance of attaining a healthy weight is also discussed.  

## 2012-05-13 ENCOUNTER — Ambulatory Visit (INDEPENDENT_AMBULATORY_CARE_PROVIDER_SITE_OTHER): Payer: Medicare Other | Admitting: Family Medicine

## 2012-05-13 VITALS — BP 144/90 | HR 78 | Resp 16 | Wt 213.0 lb

## 2012-05-13 DIAGNOSIS — K219 Gastro-esophageal reflux disease without esophagitis: Secondary | ICD-10-CM

## 2012-05-13 DIAGNOSIS — R609 Edema, unspecified: Secondary | ICD-10-CM

## 2012-05-13 DIAGNOSIS — N3010 Interstitial cystitis (chronic) without hematuria: Secondary | ICD-10-CM

## 2012-05-13 MED ORDER — PHENAZOPYRIDINE HCL 100 MG PO TABS: 100.0000 mg | ORAL_TABLET | Freq: Three times a day (TID) | ORAL | Status: DC | PRN

## 2012-05-13 MED ORDER — RANITIDINE HCL 150 MG PO TABS: 150.0000 mg | ORAL_TABLET | Freq: Every day | ORAL | Status: DC

## 2012-05-13 NOTE — Patient Instructions (Addendum)
No urine infection You can take the pyrdium Take the protonix in the morning  Take zantac at bedtime Elevate your legs Wear compression socks Referral to the urologist for your bladder F/U Dr. Lodema Hong

## 2012-05-15 ENCOUNTER — Encounter: Payer: Self-pay | Admitting: Family Medicine

## 2012-05-15 DIAGNOSIS — R609 Edema, unspecified: Secondary | ICD-10-CM | POA: Insufficient documentation

## 2012-05-15 NOTE — Assessment & Plan Note (Addendum)
I believe she may be having more IC symptoms, UA neg, sent for culture, refer back to urology She was given pyrdium states only thing that helps Culture sent

## 2012-05-15 NOTE — Progress Notes (Signed)
  Subjective:    Patient ID: Angela Simmons, female    DOB: [birth year], 67 y.o.   MRN: [id number]  HPI Pt here with lower abdominal pain and side pain, + dysuria, feels like some of her bladder symptoms in the past, with irritation of bladder, feels like she is emptying well. Also worsening heart burning, medication wears off in the evening . No fever, no vomiting, no change in bowels,. Was being treated by urology for IC, has not seen recently states pyridium helped ran out of these tablets Denies vaginal discharge Also concerned about swelling in ankle at the end of the day, tries to stay active.  Review of Systems - per above    GEN- denies fatigue, fever, weight loss,weakness, recent illness HEENT- denies eye drainage, change in vision, nasal discharge, CVS- denies chest pain, palpitations RESP- denies SOB, cough, wheeze ABD- denies N/V, change in stools,+ abd pain GU-+ dysuria, hematuria, dribbling, incontinence MSK- denies joint pain, muscle aches, injury Neuro- denies headache, dizziness, syncope, seizure activity      Objective:   Physical Exam GEN- NAD, alert and oriented x3 HEENT- PERRL, EOMI, non injected sclera, pink conjunctiva, MMM, oropharynx clear Neck- Supple,  CVS- RRR, no murmur RESP-CTAB ABD-NABS,soft,mild suprapubic tenderness, no rebound, no guarding, no CVA tenderness Back- Spine NT EXT- trace pedal edema Pulses- Radial, DP- 2+        Assessment & Plan:

## 2012-05-15 NOTE — Assessment & Plan Note (Signed)
Very mild, mostly at ankles, likley from standing during day, compression socks, elevation for now

## 2012-05-15 NOTE — Assessment & Plan Note (Signed)
Deteriorated, on protonix, try adding H2 blocker at bedtime

## 2012-06-15 ENCOUNTER — Other Ambulatory Visit: Payer: Self-pay | Admitting: Family Medicine

## 2012-06-15 ENCOUNTER — Encounter: Payer: Self-pay | Admitting: Family Medicine

## 2012-06-15 ENCOUNTER — Ambulatory Visit (INDEPENDENT_AMBULATORY_CARE_PROVIDER_SITE_OTHER): Payer: Medicare Other | Admitting: Family Medicine

## 2012-06-15 VITALS — BP 140/80 | HR 82 | Resp 18 | Ht 69.5 in | Wt 214.1 lb

## 2012-06-15 DIAGNOSIS — E785 Hyperlipidemia, unspecified: Secondary | ICD-10-CM

## 2012-06-15 DIAGNOSIS — Z1211 Encounter for screening for malignant neoplasm of colon: Secondary | ICD-10-CM

## 2012-06-15 DIAGNOSIS — E669 Obesity, unspecified: Secondary | ICD-10-CM

## 2012-06-15 DIAGNOSIS — N3010 Interstitial cystitis (chronic) without hematuria: Secondary | ICD-10-CM

## 2012-06-15 DIAGNOSIS — R7309 Other abnormal glucose: Secondary | ICD-10-CM

## 2012-06-15 DIAGNOSIS — K219 Gastro-esophageal reflux disease without esophagitis: Secondary | ICD-10-CM

## 2012-06-15 DIAGNOSIS — I889 Nonspecific lymphadenitis, unspecified: Secondary | ICD-10-CM

## 2012-06-15 DIAGNOSIS — I10 Essential (primary) hypertension: Secondary | ICD-10-CM

## 2012-06-15 LAB — BASIC METABOLIC PANEL
CO2: 28 mEq/L (ref 19–32)
Chloride: 103 mEq/L (ref 96–112)
Glucose, Bld: 110 mg/dL — ABNORMAL HIGH (ref 70–99)
Potassium: 4.2 mEq/L (ref 3.5–5.3)
Sodium: 141 mEq/L (ref 135–145)

## 2012-06-15 LAB — CBC WITH DIFFERENTIAL/PLATELET
Basophils Relative: 1 % (ref 0–1)
Eosinophils Absolute: 0 10*3/uL (ref 0.0–0.7)
HCT: 36.3 % (ref 36.0–46.0)
Hemoglobin: 12.2 g/dL (ref 12.0–15.0)
Lymphocytes Relative: 31 % (ref 12–46)
MCH: 29.3 pg (ref 26.0–34.0)
MCHC: 33.6 g/dL (ref 30.0–36.0)
Monocytes Absolute: 0.3 10*3/uL (ref 0.1–1.0)
Monocytes Relative: 7 % (ref 3–12)
Neutro Abs: 2.1 10*3/uL (ref 1.7–7.7)

## 2012-06-15 LAB — HEMOCCULT GUIAC POC 1CARD (OFFICE)

## 2012-06-15 LAB — TSH: TSH: 3.878 u[IU]/mL (ref 0.350–4.500)

## 2012-06-15 MED ORDER — DOXYCYCLINE HYCLATE 50 MG PO CAPS: 50.0000 mg | ORAL_CAPSULE | Freq: Two times a day (BID) | ORAL | Status: AC

## 2012-06-15 NOTE — Patient Instructions (Addendum)
Annual wellness in 4.5 month  Rectal today.  You will be contacted if labs are abnormal   It is important that you exercise regularly at least 30 minutes 5 times a week. If you develop chest pain, have severe difficulty breathing, or feel very tired, stop exercising immediately and seek medical attention    A healthy diet is rich in fruit, vegetables and whole grains. Poultry fish, nuts and beans are a healthy choice for protein rather then red meat. A low sodium diet and drinking 64 ounces of water daily is generally recommended. Oils and sweet should be limited. Carbohydrates especially for those who are diabetic or overweight, should be limited to 30-45 gram per meal. It is important to eat on a regular schedule, at least 3 times daily. Snacks should be primarily fruits, vegetables or nuts.  Antibiotic sent in for tender swollen glands in groin

## 2012-06-15 NOTE — Progress Notes (Signed)
  Subjective:    Patient ID: Angela Simmons, female    DOB: 07/26/1945, 67 y.o.   MRN: [id number]  HPI The PT is here for follow up and re-evaluation of chronic medical conditions, medication management and review of any available recent lab and radiology data.  Preventive health is updated, specifically  Cancer screening and Immunization.   Questions or concerns regarding consultations or procedures which the PT has had in the interim are  addressed. The PT denies any adverse reactions to current medications since the last visit.  .  Painful sore on right inner thigh x 4 days with drainage      Review of Systems See HPI Denies recent fever or chills. Denies sinus pressure, nasal congestion, ear pain or sore throat. Denies chest congestion, productive cough or wheezing. Denies chest pains, palpitations and leg swelling Denies abdominal pain, nausea, vomiting,diarrhea or constipation.   Denies dysuria, frequency, hesitancy or incontinence. Chronic back pain unchanged Denies headaches, seizures, numbness, or tingling. Denies depression, anxiety or insomnia.       Objective:   Physical Exam  Patient alert and oriented and in no cardiopulmonary distress.  HEENT: No facial asymmetry, EOMI, no sinus tenderness,  oropharynx pink and moist.  Neck supple no adenopathy.  Chest: Clear to auscultation bilaterally.  CVS: S1, S2 no murmurs, no S3.  ABD: Soft non tender. Bowel sounds normal. Rectal : no mass , heme negative stool Ext: No edema  MS: Adequate though reduced  ROM spine, shoulders, hips and knees.  Skin: Intact,tender adenopathy in right inguinal area  Psych: Good eye contact, normal affect. Memory intact not anxious or depressed appearing.  CNS: CN 2-12 intact, power, tone and sensation normal throughout.       Assessment & Plan:

## 2012-06-16 LAB — HEMOGLOBIN A1C: Mean Plasma Glucose: 140 mg/dL — ABNORMAL HIGH (ref <117)

## 2012-06-23 NOTE — Addendum Note (Signed)
Addended by: Kandis Fantasia B on: 06/23/2012 10:15 AM   Modules accepted: Orders

## 2012-06-26 NOTE — Assessment & Plan Note (Signed)
Antibiotic course prescribed, p will call if does not resolve

## 2012-06-26 NOTE — Assessment & Plan Note (Signed)
Controlled, no change in medication DASH diet and commitment to daily physical activity for a minimum of 30 minutes discussed and encouraged, as a part of hypertension management. The importance of attaining a healthy weight is also discussed.  

## 2012-06-26 NOTE — Assessment & Plan Note (Signed)
Controlled, no change in medication  

## 2012-06-26 NOTE — Assessment & Plan Note (Signed)
Updated lab needed.  LDL elevated last check  Hyperlipidemia:Low fat diet discussed and encouraged.

## 2012-06-26 NOTE — Assessment & Plan Note (Signed)
No recent flare of uncontrolled symptoms, continue medication as before

## 2012-06-26 NOTE — Assessment & Plan Note (Signed)
Unchanged. Patient re-educated about  the importance of commitment to a  minimum of 150 minutes of exercise per week. The importance of healthy food choices with portion control discussed. Encouraged to start a food diary, count calories and to consider  joining a support group. Sample diet sheets offered. Goals set by the patient for the next several months.    

## 2012-06-29 ENCOUNTER — Telehealth: Payer: Self-pay | Admitting: Family Medicine

## 2012-06-29 NOTE — Telephone Encounter (Signed)
Please advise 

## 2012-06-29 NOTE — Telephone Encounter (Signed)
Nutritional referral faxed in

## 2012-06-29 NOTE — Telephone Encounter (Signed)
No recall of that,but if she would like individual counseling re help with weight loss, and will go, pls send in a referral let her know nutritionist will call her

## 2012-06-29 NOTE — Telephone Encounter (Signed)
Recent lab after visit showed HBA1C of 6.5, note was written to advise her to attend class, she will certinly benefit from, and needs individual also, new 'diabetic" if she is interested

## 2012-06-30 ENCOUNTER — Telehealth (HOSPITAL_COMMUNITY): Payer: Self-pay | Admitting: Dietician

## 2012-06-30 NOTE — Telephone Encounter (Signed)
Called pt at 1032. Requests AM appointment. Appointment scheduled for 07/25/12 at 1000.

## 2012-06-30 NOTE — Telephone Encounter (Signed)
Received referral via fax from Dr. Lodema Hong for dx: obesity, HTN. Also noted pt with new onset DM.

## 2012-07-05 ENCOUNTER — Other Ambulatory Visit: Payer: Self-pay

## 2012-07-05 DIAGNOSIS — I10 Essential (primary) hypertension: Secondary | ICD-10-CM

## 2012-07-05 MED ORDER — AMLODIPINE BESYLATE 10 MG PO TABS: 10.0000 mg | ORAL_TABLET | Freq: Every day | ORAL | Status: DC

## 2012-07-05 MED ORDER — LOVASTATIN 20 MG PO TABS: 20.0000 mg | ORAL_TABLET | Freq: Every day | ORAL | Status: DC

## 2012-07-05 MED ORDER — PANTOPRAZOLE SODIUM 40 MG PO TBEC: 40.0000 mg | DELAYED_RELEASE_TABLET | Freq: Every day | ORAL | Status: DC

## 2012-07-05 MED ORDER — BENAZEPRIL HCL 40 MG PO TABS: 40.0000 mg | ORAL_TABLET | Freq: Every day | ORAL | Status: DC

## 2012-07-05 MED ORDER — CLONIDINE HCL 0.1 MG PO TABS: 0.1000 mg | ORAL_TABLET | Freq: Every day | ORAL | Status: DC

## 2012-07-25 ENCOUNTER — Encounter (HOSPITAL_COMMUNITY): Payer: Self-pay | Admitting: Dietician

## 2012-07-25 NOTE — Progress Notes (Signed)
Noted  

## 2012-07-25 NOTE — Progress Notes (Signed)
Outpatient Initial Nutrition Assessment  Date:07/25/2012   Appt Start Time: 0957  Referring Physician: Dr. Lodema Hong Reason for Visit: obesity, diabetes  Nutrition Assessment:  Height: 5' 9.5" (176.5 cm)   Weight: 209 lb (94.802 kg)   IBW: 148# %IBW: 141% UBW: 210# %UBW: 100% Body mass index is 30.43 kg/(m^2). Meets criteria for obesity, class I. Goal Weight: 188# (10% loss of current weight) Weight hx: Pt reports she has never paid much attention to her weight. She reports weight has been in the 201's since she can remember. She reports wt of 214# was her highest 1 month ago. Pt has lost 5# (2.3%) x 1 month.   Wt Readings from Last 10 Encounters:  07/25/12 209 lb (94.802 kg)  06/15/12 214 lb 1.3 oz (97.106 kg)  05/13/12 213 lb (96.616 kg)  01/26/12 212 lb 6.4 oz (96.344 kg)  09/17/11 207 lb (93.895 kg)  05/26/11 210 lb (95.255 kg)  05/11/11 209 lb 1.9 oz (94.856 kg)  01/26/11 213 lb 0.6 oz (96.634 kg)  11/27/10 212 lb (96.163 kg)  11/14/10 214 lb 6.4 oz (97.251 kg)   Estimated nutritional needs: 1500-1600 kcals daily, 76-95 grams protein daily, 1.5-1.6 L fluid daily  PMH:  Past Medical History  Diagnosis Date  . HTN (hypertension)   . Obesity   . Breast cancer 1990s    right breast  . GERD (gastroesophageal reflux disease)     Medications:  Current Outpatient Rx  Name  Route  Sig  Dispense  Refill  . amLODipine (NORVASC) 10 MG tablet   Oral   Take 1 tablet (10 mg total) by mouth daily.   90 tablet   1   . aspirin (ADULT ASPIRIN EC LOW STRENGTH) 81 MG EC tablet   Oral   Take 81 mg by mouth daily.          . benazepril (LOTENSIN) 40 MG tablet   Oral   Take 1 tablet (40 mg total) by mouth daily.   90 tablet   1   . calcium-vitamin D (OSCAL 500/200 D-3) 500-200 MG-UNIT per tablet   Oral   Take 1 tablet by mouth 2 (two) times daily.   180 tablet   3   . cloNIDine (CATAPRES) 0.1 MG tablet   Oral   Take 1 tablet (0.1 mg total) by mouth at bedtime.   90  tablet   1   . lovastatin (MEVACOR) 20 MG tablet   Oral   Take 1 tablet (20 mg total) by mouth at bedtime.   90 tablet   1   . Omega-3 Fatty Acids (FISH OIL) 1000 MG CAPS   Oral   Take 1 capsule by mouth daily.          . pantoprazole (PROTONIX) 40 MG tablet   Oral   Take 1 tablet (40 mg total) by mouth daily.   90 tablet   1   . phenazopyridine (PYRIDIUM) 100 MG tablet   Oral   Take 1 tablet (100 mg total) by mouth 3 (three) times daily as needed for pain.   9 tablet   1     Labs: CMP     Component Value Date/Time   NA 141 06/15/2012 0925   K 4.2 06/15/2012 0925   CL 103 06/15/2012 0925   CO2 28 06/15/2012 0925   GLUCOSE 110* 06/15/2012 0925   BUN 11 06/15/2012 0925   CREATININE 0.92 06/15/2012 0925   CREATININE 0.89 03/11/2010 1807   CALCIUM 9.3  06/15/2012 0925   PROT 7.8 01/21/2012 1030   ALBUMIN 4.2 01/21/2012 1030   AST 13 01/21/2012 1030   ALT 13 01/21/2012 1030   ALKPHOS 78 01/21/2012 1030   BILITOT 0.4 01/21/2012 1030    Lipid Panel     Component Value Date/Time   CHOL 184 01/21/2012 1030   TRIG 76 01/21/2012 1030   HDL 44 01/21/2012 1030   CHOLHDL 4.2 01/21/2012 1030   VLDL 15 01/21/2012 1030   LDLCALC 125* 01/21/2012 1030     Lab Results  Component Value Date   HGBA1C 6.5* 06/15/2012   Lab Results  Component Value Date   LDLCALC 125* 01/21/2012   CREATININE 0.92 06/15/2012     Lifestyle/ social habits: Ms. Elrod lives [place] with her husband. 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 She reports average stress level of 5/10, citing "a whole lots of stuff" including bill collectors and finances as her main sources of stress. She reports her stress is higher on some days than others. She has started back exercising as of last month, participating in a 60 minute Silver Sneaker Class at the [facility] 1-3 times per week. She will also occasionally walk on there treadmill at home.   Nutrition hx/habits: Ms. Wool reports she was told she was  "prediabetic"; she has not been asked to check her blood sugars. She tells me that her follow-up visit in September will determine her plan of care. She has been eating more baked foods and vegetables, as well as lower calorie beverages (water and diet soda). She eats 3 meals per day on a consistent schedule. She has started looking at food labels. She does the grocery shopping and cooking at her household. She reports it is difficult for her to make food choices, as her husband often objects to her healthier food preparations and she ends up throwing food away. She is also unsure of what foods she should be eating and is very concerned about how her eating habits are affecting her blood sugars. She has started using stevia sweetener.   Diet recall: Breakfast (8:30-9 AM): cereal (multigrain cheerios or raisin bran) OR oatmeal with cinnamon, ginger, peanuts, and olive oil OR egg, dry toast OR banana with almond butter, dark coffee with cream; Lunch (1:30-2 PM): sandwich (sliced [place] OR almond butter OR bologna) on whole wheat bread, handful of Lays potato chips; Dinner (6 PM): baked or broiled chicken, squash sliced tomato and cucumber. Beverages consists mostly of water, diet soda, and green tea.   Nutrition Diagnosis: Nutrition-related knowledge deficit r/t new onset diabetes AEB Hgb A1c: 6.5.  Nutrition Intervention: Nutrition rx: 1200 kcal NAS, diabetic diet; 3 meals per day; no snacks; limit 1 starch per meal; low calorie beverages only; 30 minutes physical activity daily  Education/Counseling Provided: Educated pt on principles of diabetic diet. Discussed carbohydrate metabolism in relation to diabetes. Educated pt on basic self-management principles including: goals for Hgb A1c and importance of keeping PCP appointments. Educated pt on plate method, portion sizes, and sources of carbohydrate. Discussed importance of regular meal pattern. Discussed importance of adding sources of whole grains to diet  to improve glycemic control. Also encouraged to choose low fat dairy, lean meats, and whole fruits and vegetables more often. Discussed options of artificial sweeteners and encouraged pt to use which brand she liked best. Discussed nutritional content of foods commonly eaten and discussed healthier alternatives. Discussed importance of compliance to prevent further complications of disease. Educated  pt on importance of physical activity (goal of at least 30 minutes 5 times per week) along with a healthy diet to achieve weight loss and glycemic goals. Encouraged slow, moderate weight loss of 1-2# per week, or 7-10% of current body weight. Provided plate method meal planning, "Diabetes and You", and "Carbohydrate Counting and Meal Planning" handouts. Used TeachBack to assess understanding.    Understanding, Motivation, Ability to Follow Recommendations: Expect fair compliance.   Monitoring and Evaluation: Goals: 1) 1-2# wt loss per week; 2) 30 minutes physical activity daily; 3) Hgb A1c < 6.5  Recommendations: 1) For weight loss: No less than 1200 kcals daily; 2) Substitute low calorie drink mixes and diet sodas for sugar flavored beverages; 3) Break exercise up into more smaller, more frequent sessions; 4) Discuss health concerns with husband to support lifestyle changes  F/U: PRN. Pt refused follow-up appointment at this time. She reports she may want to follow-up after her next PCP appointment in 09/2012. RD contact info given.   Melody Haver, RD, LDN 07/25/2012  Appt EndTime: 1125

## 2012-08-25 ENCOUNTER — Telehealth (HOSPITAL_COMMUNITY): Payer: Self-pay | Admitting: Dietician

## 2012-08-25 NOTE — Telephone Encounter (Signed)
Received voicemail from pt left at 1404.

## 2012-08-25 NOTE — Telephone Encounter (Signed)
Called pt at 1424. Pt was seen a month ago (07/25/12). She reports she is concerned about the amount of sugar to eat in her diet. She reports she is feeling better, making better food choices overall, and is anxious for her appointment in Septemeber, so her Hgb A1c can be rechecked. She reports that she is craving sweets and wants to know how many cakes and cookies she can have. She continues to use stevia sweetener in tea and coffee. Encouraged pt to limit sweets and added sugars in her diet. Discussed portion control and healthier alternatives to sweets (fruit and yogurt). Discussed nutritional content of cakes and cookies; discussed healthier alternatives for both. Encouraged pt to continue to use stevia to cut back on added sugar intake. Teachback method used. 5 minutes were spent on phone in consultation. Questions were answered to pt satisfaction.   Vanden Fawaz A. Mayford Knife, RD, LDN Pager: [phone]

## 2012-09-01 ENCOUNTER — Ambulatory Visit: Payer: Self-pay | Admitting: Obstetrics & Gynecology

## 2012-09-01 ENCOUNTER — Encounter: Payer: Self-pay | Admitting: Obstetrics & Gynecology

## 2012-09-01 ENCOUNTER — Ambulatory Visit (INDEPENDENT_AMBULATORY_CARE_PROVIDER_SITE_OTHER): Payer: Medicare Other | Admitting: Obstetrics & Gynecology

## 2012-09-01 VITALS — BP 136/77 | HR 81 | Temp 97.9°F | Ht 70.0 in | Wt 208.6 lb

## 2012-09-01 DIAGNOSIS — D280 Benign neoplasm of vulva: Secondary | ICD-10-CM

## 2012-09-01 DIAGNOSIS — Z01419 Encounter for gynecological examination (general) (routine) without abnormal findings: Secondary | ICD-10-CM

## 2012-09-01 DIAGNOSIS — N390 Urinary tract infection, site not specified: Secondary | ICD-10-CM

## 2012-09-01 DIAGNOSIS — N3010 Interstitial cystitis (chronic) without hematuria: Secondary | ICD-10-CM

## 2012-09-01 LAB — POCT URINALYSIS DIPSTICK
Blood, UA: NEGATIVE
Ketones, UA: NEGATIVE
Leukocytes, UA: NEGATIVE
Spec Grav, UA: <=1.005
pH, UA: 6

## 2012-09-01 NOTE — Progress Notes (Signed)
.   Subjective:     Angela Simmons is a 67 y.o. female here for a routine exam.  Current complaints: burning during urination for a couple of years.  Personal health questionnaire reviewed: no.   Gynecologic History No LMP recorded. Patient has had a hysterectomy. Abnormal bleeding. Contraception: none Last Pap: couple of years ago. Results were: normal Last mammogram: 12/2011. Results were: normal  Obstetric History OB History  No data available     The following portions of the patient's history were reviewed and updated as appropriate: allergies, current medications, past family history, past medical history, past social history, past surgical history and problem list.  Review of Systems Pertinent items are noted in HPI.    Objective:    General appearance: alert Breasts: left breast normal appearance, no masses or tenderness; right breast absent, scarring present Abdomen: soft, non-tender; bowel sounds normal; no masses,  no organomegaly Pelvic:  1 cm cystic lesion--left vulva; no adnexal masses or tenderness and vagina normal without discharge    Assessment:  H/O chronic bladder pain syndrome Likely sebaceous cyst--vulva  Healthy female exam.    Plan:    encourage follow-up w/Urology; return for removal of vulvar cyst

## 2012-09-02 LAB — URINALYSIS, ROUTINE W REFLEX MICROSCOPIC
Bilirubin Urine: NEGATIVE
Glucose, UA: NEGATIVE mg/dL
Hgb urine dipstick: NEGATIVE
Ketones, ur: NEGATIVE mg/dL
Protein, ur: NEGATIVE mg/dL
Specific Gravity, Urine: <1.005 — ABNORMAL LOW (ref 1.005–1.030)
Urobilinogen, UA: 0.2 mg/dL (ref 0.0–1.0)

## 2012-09-02 LAB — URINALYSIS, MICROSCOPIC ONLY
Bacteria, UA: NONE SEEN
Crystals: NONE SEEN

## 2012-09-02 LAB — CYTOLOGY - NON PAP

## 2012-09-05 ENCOUNTER — Encounter: Payer: Self-pay | Admitting: Obstetrics & Gynecology

## 2012-09-05 ENCOUNTER — Other Ambulatory Visit: Payer: Self-pay | Admitting: Family Medicine

## 2012-09-05 DIAGNOSIS — Z139 Encounter for screening, unspecified: Secondary | ICD-10-CM

## 2012-09-10 ENCOUNTER — Encounter: Payer: Self-pay | Admitting: Obstetrics & Gynecology

## 2012-09-14 ENCOUNTER — Ambulatory Visit: Payer: Medicare Other | Admitting: Obstetrics & Gynecology

## 2012-09-15 ENCOUNTER — Ambulatory Visit (HOSPITAL_COMMUNITY)
Admission: RE | Admit: 2012-09-15 | Discharge: 2012-09-15 | Disposition: A | Discharge status: Home / Self Care | Payer: Medicare Other | Source: Ambulatory Visit | Attending: Family Medicine | Admitting: Family Medicine

## 2012-09-15 DIAGNOSIS — Z1231 Encounter for screening mammogram for malignant neoplasm of breast: Secondary | ICD-10-CM | POA: Insufficient documentation

## 2012-09-15 DIAGNOSIS — Z139 Encounter for screening, unspecified: Secondary | ICD-10-CM

## 2012-10-04 ENCOUNTER — Encounter: Payer: Self-pay | Admitting: Family Medicine

## 2012-10-04 ENCOUNTER — Ambulatory Visit (INDEPENDENT_AMBULATORY_CARE_PROVIDER_SITE_OTHER): Payer: Medicare Other | Admitting: Family Medicine

## 2012-10-04 VITALS — BP 124/82 | HR 78 | Temp 97.8°F | Resp 16 | Wt 207.8 lb

## 2012-10-04 DIAGNOSIS — J309 Allergic rhinitis, unspecified: Secondary | ICD-10-CM

## 2012-10-04 DIAGNOSIS — E119 Type 2 diabetes mellitus without complications: Secondary | ICD-10-CM

## 2012-10-04 DIAGNOSIS — E785 Hyperlipidemia, unspecified: Secondary | ICD-10-CM

## 2012-10-04 DIAGNOSIS — E669 Obesity, unspecified: Secondary | ICD-10-CM

## 2012-10-04 DIAGNOSIS — K219 Gastro-esophageal reflux disease without esophagitis: Secondary | ICD-10-CM

## 2012-10-04 DIAGNOSIS — I10 Essential (primary) hypertension: Secondary | ICD-10-CM

## 2012-10-04 LAB — HEMOGLOBIN A1C: Hgb A1c MFr Bld: 6.5 % — ABNORMAL HIGH (ref <5.7)

## 2012-10-04 MED ORDER — FLUTICASONE PROPIONATE 50 MCG/ACT NA SUSP: 2.0000 | Freq: Every day | NASAL | Status: DC

## 2012-10-04 MED ORDER — LORATADINE 10 MG PO TABS: 10.0000 mg | ORAL_TABLET | Freq: Every day | ORAL | Status: DC

## 2012-10-04 MED ORDER — PROMETHAZINE-DM 6.25-15 MG/5ML PO SYRP: ORAL_SOLUTION | ORAL | Status: AC

## 2012-10-04 MED ORDER — PREDNISONE 5 MG PO TABS: 5.0000 mg | ORAL_TABLET | Freq: Two times a day (BID) | ORAL | Status: AC

## 2012-10-04 MED ORDER — METHYLPREDNISOLONE ACETATE 80 MG/ML IJ SUSP
80.0000 mg | Freq: Once | INTRAMUSCULAR | Status: AC
  Administered 2012-10-04: 80 mg via INTRAMUSCULAR

## 2012-10-04 NOTE — Progress Notes (Signed)
  Subjective:    Patient ID: Angela Simmons, female    DOB: 04-15-1945, 67 y.o.   MRN: [id number]  HPI 5 day h/o excessive cough, post nasal drainage which is clear, watery eyes , itchy, no  yellow sputum  Or yellow drainage, has felt warm, no body aches or chills. Statrted claritin just yesterday, denies wheezing or shortness of breath Prior to this had been in her usual state of health Denies any recent flares of cystitis Denies polyuria, polydipsia or hypoglycemic episodes. No blurred vision   Review of Systems See HPI  Denies chest pains, palpitations and leg swelling Denies abdominal pain, nausea, vomiting,diarrhea or constipation.   Denies dysuria, frequency, hesitancy or incontinence. Denies uncontrolled  joint pain, swelling and limitation in mobility. Denies headaches, seizures, numbness, or tingling. Denies depression, anxiety or insomnia. Denies skin break down or rash.        Objective:   Physical Exam  Patient alert and oriented and in no cardiopulmonary distress.  HEENT: No facial asymmetry, EOMI, no sinus tenderness,  oropharynx pink and moist.  Neck supple no adenopathy.Erythema and edema of nasal mucosa, excessive watery eyes, TM clear  Chest: Clear to auscultation bilaterally.  CVS: S1, S2 no murmurs, no S3.  ABD: Soft non tender. Bowel sounds normal.  Ext: No edema  MS: Adequate ROM spine, shoulders, hips and knees.  Skin: Intact, no ulcerations or rash noted.  Psych: Good eye contact, normal affect. Memory intact not anxious or depressed appearing.  CNS: CN 2-12 intact, power, tone and sensation normal throughout.       Assessment & Plan:

## 2012-10-04 NOTE — Patient Instructions (Addendum)
F/u in 4  Weeks.  HBA1C, chem 7 , lipid cmp  Today  You are having uncontrolled allergy symptoms, no evidence of infection at this visit  Depo medrol 80 mg IM today in office  Please use flonase and claritin EVERY day for uncontrolled allergies  Get sudafed OTC one daily or the next 3 days to help with excessive drainage. 5 day course of prednisone and cough suppressant syrup for bedtime use is prescribed   Call in the next week if symptoms worsen

## 2012-10-05 ENCOUNTER — Ambulatory Visit: Payer: Medicare Other | Admitting: Family Medicine

## 2012-10-05 DIAGNOSIS — E1140 Type 2 diabetes mellitus with diabetic neuropathy, unspecified: Secondary | ICD-10-CM | POA: Insufficient documentation

## 2012-10-05 LAB — COMPREHENSIVE METABOLIC PANEL
AST: 16 U/L (ref 0–37)
Albumin: 4 g/dL (ref 3.5–5.2)
Alkaline Phosphatase: 74 U/L (ref 39–117)
BUN: 12 mg/dL (ref 6–23)
CO2: 27 mEq/L (ref 19–32)
Calcium: 9.6 mg/dL (ref 8.4–10.5)
Chloride: 103 mEq/L (ref 96–112)
Creat: 0.81 mg/dL (ref 0.50–1.10)
Potassium: 4.4 mEq/L (ref 3.5–5.3)
Sodium: 137 mEq/L (ref 135–145)
Total Bilirubin: 0.4 mg/dL (ref 0.3–1.2)

## 2012-10-05 LAB — LIPID PANEL
Cholesterol: 189 mg/dL (ref 0–200)
HDL: 42 mg/dL (ref >39)
Total CHOL/HDL Ratio: 4.5 Ratio
VLDL: 17 mg/dL (ref 0–40)

## 2012-10-05 MED ORDER — LOVASTATIN 40 MG PO TABS: 40.0000 mg | ORAL_TABLET | Freq: Every day | ORAL | Status: DC

## 2012-10-05 MED ORDER — METFORMIN HCL 500 MG PO TABS: 500.0000 mg | ORAL_TABLET | Freq: Every day | ORAL | Status: DC

## 2012-10-06 ENCOUNTER — Telehealth (HOSPITAL_COMMUNITY): Payer: Self-pay | Admitting: Dietician

## 2012-10-06 NOTE — Telephone Encounter (Signed)
Received another referral from Dr. Lodema Hong for dx: new onset diabetes. Noted pt has been referred earlier in the year and was seen on 07/25/12, at which time pt declined further follow-up.

## 2012-10-06 NOTE — Telephone Encounter (Signed)
Called and left message on pt voicemail and 1207.

## 2012-10-10 NOTE — Telephone Encounter (Signed)
No response to previous contact attempt. Sent letter to pt home via US Mail in attempt to contact pt to schedule appointment.  

## 2012-10-14 NOTE — Telephone Encounter (Signed)
Called back at 1004. Appointment scheduled for 10/26/12 at 1100.

## 2012-10-14 NOTE — Telephone Encounter (Signed)
Received multiple voicemails from pt left on 9/24 at 0926, 9/24 at 1411, and 9/25 at 1015.

## 2012-10-14 NOTE — Telephone Encounter (Signed)
Noted  

## 2012-11-01 ENCOUNTER — Encounter (HOSPITAL_COMMUNITY): Payer: Self-pay | Admitting: Dietician

## 2012-11-01 ENCOUNTER — Telehealth (HOSPITAL_COMMUNITY): Payer: Self-pay | Admitting: Dietician

## 2012-11-01 NOTE — Telephone Encounter (Signed)
Noted  

## 2012-11-01 NOTE — Telephone Encounter (Signed)
Pt was a no-show for appointment scheduled for 11/01/2012 at 0900. Sent letter to pt home notifying pt of no-show and requesting rescheduling appointment.

## 2012-11-01 NOTE — Progress Notes (Signed)
Follow-Up Outpatient Nutrition Note Date: 11/01/2012  Appt Start Time: 1120   Patient scheduled for 9 AM this morning. She arrived after 10:00 AM. She was seen as a walk-in.  Nutrition Assessment:  Current weight: Weight: 202 lb (91.627 kg)  BMI: Body mass index is 29.41 kg/(m^2).  Weight changes: -7# (3.5%) x 3 months  Unfortunately, Angela Simmons has been diagnosed with diabetes. Her latest Hgb A1c was 6.5. She reports it took her a while to get used to her self care and accept her diagnosis.  Since her last visit approximately 3 months ago, she has lost 7# (3.5%). She reports that she is trying to eat better and exercise. She states she is limiting carbs and eating more vegetables. She also attends a Silver Sneakers Class at the [facility] 2-3 times per week.  Reviewed blood sugar log; since 10/11/12 readings have ranged from 49-123. Average glucose in in the low 100's to teens. She reports she sometimes feels "shaky" when she takes her blood sugar. Noticed two instances of hypoglycemia. SHe is current only checking fasting glucose every AM.  She is mainly concerned about the her beverages choices (what can she drink other than water).   Labs: CMP     Component Value Date/Time   NA 137 10/04/2012 1133   K 4.4 10/04/2012 1133   CL 103 10/04/2012 1133   CO2 27 10/04/2012 1133   GLUCOSE 89 10/04/2012 1133   BUN 12 10/04/2012 1133   CREATININE 0.81 10/04/2012 1133   CREATININE 0.89 03/11/2010 1807   CALCIUM 9.6 10/04/2012 1133   PROT 8.0 10/04/2012 1133   ALBUMIN 4.0 10/04/2012 1133   AST 16 10/04/2012 1133   ALT 16 10/04/2012 1133   ALKPHOS 74 10/04/2012 1133   BILITOT 0.4 10/04/2012 1133    Lipid Panel     Component Value Date/Time   CHOL 189 10/04/2012 1133   TRIG 85 10/04/2012 1133   HDL 42 10/04/2012 1133   CHOLHDL 4.5 10/04/2012 1133   VLDL 17 10/04/2012 1133   LDLCALC 130* 10/04/2012 1133     Lab Results  Component Value Date   HGBA1C 6.5* 10/04/2012   HGBA1C 6.5* 06/15/2012   Lab  Results  Component Value Date   LDLCALC 130* 10/04/2012   CREATININE 0.81 10/04/2012     Diet recall: Breakfast: oatmeal with peanuts and raisins, almond milk OR cereal (shredded wheat); Lunch: sandwich with tomato and bologna; Dinner: soup or sandwich; Snacks: cookies. Beverages consist mainly of water, crystal light, or green tea.   Nutrition Diagnosis: Nutrition-related knowledge deficit r/t new onset diabetes AEB Hgb A1c: 6.5.; continues  Nutrition Intervention: Nutrition rx: 1200 kcal NAS, diabetic diet; 3 meals per day; no snacks; limit 1 starch per meal; low calorie beverages only; 30 minutes physical activity daily  Education/ counseling provided: Educated pt on principles of diabetic diet. Discussed carbohydrate metabolism in relation to diabetes. Educated pt on basic self-management principles including: signs and symptoms of hyperglycemia and hypoglycemia, goals for fasting and postprandial blood sugars, goals for Hgb A1c, importance of checking feet, importance of keeping PCP appointments, and foot care. Educated pt on plate method, portion sizes, and sources of carbohydrate. Discussed importance of regular meal pattern. Discussed importance of adding sources of whole grains to diet to improve glycemic control. Also encouraged to choose low fat dairy, lean meats, and whole fruits and vegetables more often. Discussed options of artificial sweeteners and encouraged pt to use which brand she liked best. Discussed nutritional content of  foods commonly eaten and discussed healthier alternatives. Discussed importance of compliance to prevent further complications of disease. Educated pt on importance of physical activity (goal of at least 30 minutes 5 times per week) along with a healthy diet to achieve weight loss and glycemic goals. Encouraged slow, moderate weight loss of 1-2# per week, or 7-10% of current body weight. Provided "Carb Counting and Meal Planning" and "Diabetes and You" handouts.  Used TeachBack to assess understanding.   Understanding/Motivation/ Ability to follow recommendations: Expect fair to good compliance.    Monitoring and Evaluation: Previous Goals: 1) 1-2# wt loss per week- progressing; 2) 30 minutes physical activity daily- goal meat; 3) Hgb A1c < 6.5- goal not met  Goals for next visit: 1) 1-2# wt loss x 1 week; 2) At least 2.5 hours of physical activity per week; 3) Hgb A1c < 7.0  Recommendations: 1) Substitute low calorie drink mixes, tea/coffee with artifical sweetener, or water with fruit slices for sugar sweetened beverages; 2) Limit snacks such as cookies- substitute for fruit or yogurt; 3) Fingerstick on side of finer instead of tip for less pain  F/U: PRN. Pt declined additional follow-up.  Irish Breisch A. Mayford Knife, RD, LDN Date:11/01/2012 Appt EndTime: 0454

## 2012-11-02 ENCOUNTER — Other Ambulatory Visit: Payer: Self-pay | Admitting: Family Medicine

## 2012-11-12 NOTE — Assessment & Plan Note (Signed)
Uncontrolled with elevated lDL Hyperlipidemia:Low fat diet discussed and encouraged.  No med change at this time

## 2012-11-12 NOTE — Assessment & Plan Note (Signed)
Uncontrolled, depo medrol in office followed by oral prednisone, \ Pt educatd re need to use allergy medication daily She is to use sudafed for a few days to reduce drainage

## 2012-11-12 NOTE — Assessment & Plan Note (Signed)
Controlled, no change in medication  

## 2012-11-12 NOTE — Assessment & Plan Note (Signed)
Controlled, no change in medication Patient advised to reduce carb and sweets, commit to regular physical activity, take meds as prescribed, test blood as directed, and attempt to lose weight, to improve blood sugar control.  

## 2012-11-14 ENCOUNTER — Ambulatory Visit (INDEPENDENT_AMBULATORY_CARE_PROVIDER_SITE_OTHER): Payer: Medicare Other | Admitting: Family Medicine

## 2012-11-14 ENCOUNTER — Encounter (INDEPENDENT_AMBULATORY_CARE_PROVIDER_SITE_OTHER): Payer: Self-pay

## 2012-11-14 ENCOUNTER — Encounter: Payer: Self-pay | Admitting: Family Medicine

## 2012-11-14 ENCOUNTER — Telehealth: Payer: Self-pay | Admitting: Family Medicine

## 2012-11-14 VITALS — BP 128/80 | HR 83 | Resp 16 | Ht 69.0 in | Wt 205.4 lb

## 2012-11-14 DIAGNOSIS — I10 Essential (primary) hypertension: Secondary | ICD-10-CM

## 2012-11-14 DIAGNOSIS — R5381 Other malaise: Secondary | ICD-10-CM

## 2012-11-14 DIAGNOSIS — E119 Type 2 diabetes mellitus without complications: Secondary | ICD-10-CM

## 2012-11-14 DIAGNOSIS — Z9010 Acquired absence of unspecified breast and nipple: Secondary | ICD-10-CM

## 2012-11-14 DIAGNOSIS — Z13820 Encounter for screening for osteoporosis: Secondary | ICD-10-CM

## 2012-11-14 DIAGNOSIS — E669 Obesity, unspecified: Secondary | ICD-10-CM

## 2012-11-14 DIAGNOSIS — Z9011 Acquired absence of right breast and nipple: Secondary | ICD-10-CM | POA: Insufficient documentation

## 2012-11-14 DIAGNOSIS — Z23 Encounter for immunization: Secondary | ICD-10-CM

## 2012-11-14 DIAGNOSIS — E785 Hyperlipidemia, unspecified: Secondary | ICD-10-CM

## 2012-11-14 DIAGNOSIS — E1149 Type 2 diabetes mellitus with other diabetic neurological complication: Secondary | ICD-10-CM

## 2012-11-14 MED ORDER — AMLODIPINE BESYLATE 10 MG PO TABS: 10.0000 mg | ORAL_TABLET | Freq: Every day | ORAL | Status: DC

## 2012-11-14 MED ORDER — BENAZEPRIL HCL 40 MG PO TABS: 40.0000 mg | ORAL_TABLET | Freq: Every day | ORAL | Status: DC

## 2012-11-14 NOTE — Progress Notes (Signed)
  Subjective:    Patient ID: Angela Simmons, female    DOB: [birth year], 67 y.o.   MRN: [id number]  HPI Pt in for follow up of her new diagnosis of diabetes. She has been diligent with daily testing and has both her meter and log book which show blood sugars mainly under 120 in the mornings. Has attended diabetic nutrition /education class and has actually ;lost 4 pounds since initial diagnosis, also commited to exercise. Diabetes is in her family. Mild confusion of blood sugar targets as given 2 different ranges so I clarified for her, they were almost identical. No other concerns, flu vaccine today    Review of Systems See HPI Denies recent fever or chills. Denies sinus pressure, nasal congestion, ear pain or sore throat. Denies chest congestion, productive cough or wheezing. Denies chest pains, palpitations and leg swelling Denies abdominal pain, nausea, vomiting,diarrhea or constipation.   Denies dysuria, frequency, hesitancy or incontinence. Chronic  joint pain, swelling and limitation in mobility. Denies headaches, seizures, numbness, or tingling. Denies depression, anxiety or insomnia. Denies skin break down or rash.        Objective:   Physical Exam  Patient alert and oriented and in no cardiopulmonary distress.  HEENT: No facial asymmetry, EOMI, no sinus tenderness,  oropharynx pink and moist.  Neck supple no adenopathy.  Chest: Clear to auscultation bilaterally.  CVS: S1, S2 no murmurs, no S3.  ABD: Soft non tender. Bowel sounds normal.  Ext: No edema  MS: Adequate though reduced  ROM spine, shoulders, hips and knees.  Skin: Intact, no ulcerations or rash noted.  Psych: Good eye contact, normal affect. Memory intact not anxious or depressed appearing.  CNS: CN 2-12 intact, power, tone and sensation normal throughout.       Assessment & Plan:

## 2012-11-14 NOTE — Patient Instructions (Signed)
F/u first week in January, please call if you need me before  Flu vaccine today.  Microalb today  Ensure you examine your feet daily to check for cuts , since you cannot feel on the soles of your feet   You are doing well with blood sugar values, keep on as you are doing  It is important that you exercise regularly at least 30 minutes 5 times a week. If you develop chest pain, have severe difficulty breathing, or feel very tired, stop exercising immediately and seek medical attention   Fasting lipid, cmp and EGFR and HBA1C early January , 3 days before f/u

## 2012-11-15 LAB — MICROALBUMIN / CREATININE URINE RATIO: Microalb, Ur: 1.39 mg/dL (ref 0.00–1.89)

## 2012-11-20 NOTE — Assessment & Plan Note (Signed)
Controlled, no change in medication DASH diet and commitment to daily physical activity for a minimum of 30 minutes discussed and encouraged, as a part of hypertension management. The importance of attaining a healthy weight is also discussed.  

## 2012-11-20 NOTE — Assessment & Plan Note (Signed)
Elevated TG, needs to reduce fried and fatty food, no med change

## 2012-11-20 NOTE — Assessment & Plan Note (Signed)
Improved. Pt applauded on succesful weight loss through lifestyle change, and encouraged to continue same. Weight loss goal set for the next several months.  

## 2012-11-20 NOTE — Assessment & Plan Note (Signed)
Patient advised to reduce carb and sweets, commit to regular physical activity, take meds as prescribed, test blood as directed, and attempt to lose weight, to improve blood sugar control. Pt taught and encouraged to check feet daily as her sensation is diminished in both feet

## 2012-11-21 NOTE — Telephone Encounter (Signed)
Patient is aware 

## 2012-11-22 ENCOUNTER — Ambulatory Visit (HOSPITAL_COMMUNITY)
Admission: RE | Admit: 2012-11-22 | Discharge: 2012-11-22 | Disposition: A | Discharge status: Home / Self Care | Payer: Medicare Other | Source: Ambulatory Visit | Attending: Family Medicine | Admitting: Family Medicine

## 2012-11-22 DIAGNOSIS — Z853 Personal history of malignant neoplasm of breast: Secondary | ICD-10-CM | POA: Insufficient documentation

## 2012-11-22 DIAGNOSIS — Z13820 Encounter for screening for osteoporosis: Secondary | ICD-10-CM

## 2012-11-22 DIAGNOSIS — Z780 Asymptomatic menopausal state: Secondary | ICD-10-CM | POA: Insufficient documentation

## 2012-12-09 ENCOUNTER — Other Ambulatory Visit: Payer: Self-pay | Admitting: General Practice

## 2012-12-09 ENCOUNTER — Telehealth: Payer: Self-pay | Admitting: *Deleted

## 2012-12-09 DIAGNOSIS — E119 Type 2 diabetes mellitus without complications: Secondary | ICD-10-CM

## 2012-12-09 MED ORDER — GLUCOSE BLOOD VI STRP: ORAL_STRIP | Status: DC

## 2012-12-09 NOTE — Telephone Encounter (Signed)
Please order Accu-chek, Aviva Plus test strips at [facility].

## 2012-12-16 ENCOUNTER — Other Ambulatory Visit: Payer: Self-pay | Admitting: Family Medicine

## 2012-12-19 ENCOUNTER — Other Ambulatory Visit: Payer: Self-pay

## 2012-12-19 DIAGNOSIS — I10 Essential (primary) hypertension: Secondary | ICD-10-CM

## 2012-12-19 MED ORDER — BENAZEPRIL HCL 40 MG PO TABS: 40.0000 mg | ORAL_TABLET | Freq: Every day | ORAL | Status: DC

## 2012-12-19 MED ORDER — PANTOPRAZOLE SODIUM 40 MG PO TBEC: 40.0000 mg | DELAYED_RELEASE_TABLET | Freq: Every day | ORAL | Status: DC

## 2012-12-19 MED ORDER — CLONIDINE HCL 0.1 MG PO TABS: 0.1000 mg | ORAL_TABLET | Freq: Every day | ORAL | Status: DC

## 2012-12-19 MED ORDER — AMLODIPINE BESYLATE 10 MG PO TABS: 10.0000 mg | ORAL_TABLET | Freq: Every day | ORAL | Status: DC

## 2012-12-26 MED ORDER — UNILET EXCELITE II MISC: Status: DC

## 2012-12-26 MED ORDER — GLUCOSE BLOOD VI STRP: ORAL_STRIP | Status: DC

## 2012-12-26 NOTE — Addendum Note (Signed)
Addended by: Kandis Fantasia B on: 12/26/2012 09:23 AM   Modules accepted: Orders, Medications

## 2013-01-20 LAB — COMPLETE METABOLIC PANEL WITH GFR
ALT: 16 U/L (ref 0–35)
AST: 18 U/L (ref 0–37)
Albumin: 3.9 g/dL (ref 3.5–5.2)
Alkaline Phosphatase: 59 U/L (ref 39–117)
BUN: 14 mg/dL (ref 6–23)
CO2: 26 mEq/L (ref 19–32)
Calcium: 9.4 mg/dL (ref 8.4–10.5)
Chloride: 103 mEq/L (ref 96–112)
Creat: 0.95 mg/dL (ref 0.50–1.10)
GFR, Est African American: 72 mL/min
GFR, Est Non African American: 63 mL/min
Glucose, Bld: 83 mg/dL (ref 70–99)
Potassium: 4.2 mEq/L (ref 3.5–5.3)
Sodium: 138 mEq/L (ref 135–145)
Total Bilirubin: 0.4 mg/dL (ref 0.3–1.2)
Total Protein: 7.3 g/dL (ref 6.0–8.3)

## 2013-01-20 LAB — LIPID PANEL
Cholesterol: 133 mg/dL (ref 0–200)
HDL: 41 mg/dL (ref >39)
LDL Cholesterol: 82 mg/dL (ref 0–99)
Total CHOL/HDL Ratio: 3.2 Ratio
Triglycerides: 49 mg/dL (ref <150)
VLDL: 10 mg/dL (ref 0–40)

## 2013-01-20 LAB — HEMOGLOBIN A1C
Hgb A1c MFr Bld: 6.4 % — ABNORMAL HIGH (ref <5.7)
Mean Plasma Glucose: 137 mg/dL — ABNORMAL HIGH (ref <117)

## 2013-01-23 ENCOUNTER — Encounter: Payer: Self-pay | Admitting: Family Medicine

## 2013-01-23 ENCOUNTER — Ambulatory Visit (INDEPENDENT_AMBULATORY_CARE_PROVIDER_SITE_OTHER): Payer: Medicare HMO | Admitting: Family Medicine

## 2013-01-23 ENCOUNTER — Encounter (INDEPENDENT_AMBULATORY_CARE_PROVIDER_SITE_OTHER): Payer: Self-pay

## 2013-01-23 VITALS — BP 120/82 | HR 82 | Resp 16 | Ht 69.0 in | Wt 203.0 lb

## 2013-01-23 DIAGNOSIS — E1149 Type 2 diabetes mellitus with other diabetic neurological complication: Secondary | ICD-10-CM

## 2013-01-23 DIAGNOSIS — K219 Gastro-esophageal reflux disease without esophagitis: Secondary | ICD-10-CM

## 2013-01-23 DIAGNOSIS — E785 Hyperlipidemia, unspecified: Secondary | ICD-10-CM

## 2013-01-23 DIAGNOSIS — E669 Obesity, unspecified: Secondary | ICD-10-CM

## 2013-01-23 DIAGNOSIS — J309 Allergic rhinitis, unspecified: Secondary | ICD-10-CM

## 2013-01-23 DIAGNOSIS — E119 Type 2 diabetes mellitus without complications: Secondary | ICD-10-CM

## 2013-01-23 DIAGNOSIS — I10 Essential (primary) hypertension: Secondary | ICD-10-CM

## 2013-01-23 MED ORDER — LOVASTATIN 40 MG PO TABS: 40.0000 mg | ORAL_TABLET | Freq: Every day | ORAL | Status: DC

## 2013-01-23 MED ORDER — METFORMIN HCL 500 MG PO TABS: 500.0000 mg | ORAL_TABLET | Freq: Every day | ORAL | Status: DC

## 2013-01-23 NOTE — Assessment & Plan Note (Signed)
Unchanged. Patient re-educated about  the importance of commitment to a  minimum of 150 minutes of exercise per week. The importance of healthy food choices with portion control discussed. Encouraged to start a food diary, count calories and to consider  joining a support group. Sample diet sheets offered. Goals set by the patient for the next several months.    

## 2013-01-23 NOTE — Progress Notes (Signed)
   Subjective:    Patient ID: Angela Simmons, female    DOB: 11/23/1945, 68 y.o.   MRN: [id number]  HPI The PT is here for follow up and re-evaluation of chronic medical conditions, medication management and review of any available recent lab and radiology data.  Preventive health is updated, specifically  Cancer screening and Immunization.   The PT denies any adverse reactions to current medications since the last visit.  Intermittent left elbow pain x 3 months, no associated trauma, not limiting movement , also left buttock pain with quick /sharp movement Tests blood sugar daily, FBG generally less than 125, she has her log book   Review of Systems See HPI Denies recent fever or chills. Denies sinus pressure, nasal congestion, ear pain or sore throat. Denies chest congestion, productive cough or wheezing. Denies chest pains, palpitations and leg swelling Denies abdominal pain, nausea, vomiting,diarrhea or constipation.   Denies dysuria, frequency, hesitancy or incontinence.  Denies headaches, seizures, numbness, or tingling. Denies depression, anxiety or insomnia. Denies skin break down or rash.        Objective:   Physical Exam Patient alert and oriented and in no cardiopulmonary distress.  HEENT: No facial asymmetry, EOMI, no sinus tenderness,  oropharynx pink and moist.  Neck supple no adenopathy.  Chest: Clear to auscultation bilaterally.  CVS: S1, S2 no murmurs, no S3.  ABD: Soft non tender. Bowel sounds normal.  Ext: No edema  MS: Adequate ROM spine, shoulders, hips and knees.  Skin: Intact, no ulcerations or rash noted.  Psych: Good eye contact, normal affect. Memory intact not anxious or depressed appearing.  CNS: CN 2-12 intact, power, tone and sensation normal throughout.        Assessment & Plan:  Lab data reviewed, and clinical exam performed shows good control of chronic conditions. No changes inmeds. Pat need to focus on weight loss and daily  exercise with improved diet  Labs for next visit

## 2013-01-23 NOTE — Patient Instructions (Addendum)
F/u in 4.5 month, call if you need me before  Pls commit to exercise 5 days per week.  No changes in medication, labs are very good  Weight loss goal of 4 to 6 pounds  HBA1C and chem 7 ofr next visit

## 2013-01-23 NOTE — Assessment & Plan Note (Signed)
Imporoved. No med change. Advised may reduce BG testing to 4 times weekly Patient advised to reduce carb and sweets, commit to regular physical activity, take meds as prescribed, test blood as directed, and attempt to lose weight, to improve blood sugar control.

## 2013-01-23 NOTE — Assessment & Plan Note (Signed)
Improved, pt applayuded on this. No med change Hyperlipidemia:Low fat diet discussed and encouraged.

## 2013-01-23 NOTE — Assessment & Plan Note (Signed)
Controlled, no change in medication DASH diet and commitment to daily physical activity for a minimum of 30 minutes discussed and encouraged, as a part of hypertension management. The importance of attaining a healthy weight is also discussed.  

## 2013-01-23 NOTE — Assessment & Plan Note (Signed)
Controlled, no change in medication  

## 2013-02-01 ENCOUNTER — Other Ambulatory Visit: Payer: Self-pay

## 2013-02-01 MED ORDER — GLUCOSE BLOOD VI STRP: ORAL_STRIP | Status: DC

## 2013-04-10 ENCOUNTER — Telehealth: Payer: Self-pay | Admitting: Family Medicine

## 2013-04-10 DIAGNOSIS — E119 Type 2 diabetes mellitus without complications: Secondary | ICD-10-CM

## 2013-04-10 MED ORDER — METFORMIN HCL 500 MG PO TABS: 500.0000 mg | ORAL_TABLET | Freq: Every day | ORAL | Status: DC

## 2013-04-10 NOTE — Telephone Encounter (Signed)
Med refilled to [facility]

## 2013-04-17 ENCOUNTER — Other Ambulatory Visit: Payer: Self-pay

## 2013-04-17 DIAGNOSIS — E119 Type 2 diabetes mellitus without complications: Secondary | ICD-10-CM

## 2013-04-17 MED ORDER — METFORMIN HCL 500 MG PO TABS: 500.0000 mg | ORAL_TABLET | Freq: Every day | ORAL | Status: DC

## 2013-05-01 ENCOUNTER — Telehealth: Payer: Self-pay

## 2013-05-01 NOTE — Telephone Encounter (Signed)
Patient called stating that she has an area on the back of her neck that feels like a boil.  Noticed x 10 days.  Area has no drainage.  Given # for urgent care in [place].  Patient will call to see if they accept her insurance.

## 2013-05-08 ENCOUNTER — Other Ambulatory Visit: Payer: Self-pay

## 2013-05-08 MED ORDER — BENAZEPRIL HCL 40 MG PO TABS: 40.0000 mg | ORAL_TABLET | Freq: Every day | ORAL | Status: DC

## 2013-05-08 MED ORDER — AMLODIPINE BESYLATE 10 MG PO TABS: 10.0000 mg | ORAL_TABLET | Freq: Every day | ORAL | Status: DC

## 2013-05-08 MED ORDER — PANTOPRAZOLE SODIUM 40 MG PO TBEC: 40.0000 mg | DELAYED_RELEASE_TABLET | Freq: Every day | ORAL | Status: DC

## 2013-05-16 ENCOUNTER — Encounter (INDEPENDENT_AMBULATORY_CARE_PROVIDER_SITE_OTHER): Payer: Self-pay

## 2013-05-16 ENCOUNTER — Encounter: Payer: Self-pay | Admitting: Family Medicine

## 2013-05-16 ENCOUNTER — Ambulatory Visit (INDEPENDENT_AMBULATORY_CARE_PROVIDER_SITE_OTHER): Payer: Medicare HMO | Admitting: Family Medicine

## 2013-05-16 VITALS — BP 130/80 | HR 76 | Resp 16 | Wt 204.0 lb

## 2013-05-16 DIAGNOSIS — E669 Obesity, unspecified: Secondary | ICD-10-CM

## 2013-05-16 DIAGNOSIS — L723 Sebaceous cyst: Secondary | ICD-10-CM

## 2013-05-16 DIAGNOSIS — I10 Essential (primary) hypertension: Secondary | ICD-10-CM

## 2013-05-16 DIAGNOSIS — E785 Hyperlipidemia, unspecified: Secondary | ICD-10-CM

## 2013-05-16 DIAGNOSIS — E1149 Type 2 diabetes mellitus with other diabetic neurological complication: Secondary | ICD-10-CM

## 2013-05-16 LAB — BASIC METABOLIC PANEL
BUN: 12 mg/dL (ref 6–23)
CO2: 28 mEq/L (ref 19–32)
Calcium: 9.3 mg/dL (ref 8.4–10.5)
Chloride: 105 mEq/L (ref 96–112)
Creat: 0.91 mg/dL (ref 0.50–1.10)
Glucose, Bld: 91 mg/dL (ref 70–99)
POTASSIUM: 4.1 meq/L (ref 3.5–5.3)
SODIUM: 141 meq/L (ref 135–145)

## 2013-05-16 LAB — HEMOGLOBIN A1C
HEMOGLOBIN A1C: 6.5 % — AB (ref <5.7)
Mean Plasma Glucose: 140 mg/dL — ABNORMAL HIGH (ref <117)

## 2013-05-16 NOTE — Patient Instructions (Signed)
Annual physical exam with pap end May or early June , cancel sooner appointment.  Blood pressure is good   We will call if labs are abnormal  You have a "blackhead " on the back of your neck. Do NOT squeeze. Apply small amts of antibiotic ointment twice daily to the area, call if a problem

## 2013-05-18 ENCOUNTER — Other Ambulatory Visit: Payer: Self-pay

## 2013-05-18 DIAGNOSIS — E119 Type 2 diabetes mellitus without complications: Secondary | ICD-10-CM

## 2013-05-18 DIAGNOSIS — E785 Hyperlipidemia, unspecified: Secondary | ICD-10-CM

## 2013-05-18 MED ORDER — LORATADINE 10 MG PO TABS: 10.0000 mg | ORAL_TABLET | Freq: Every day | ORAL | Status: DC

## 2013-05-18 MED ORDER — FLUTICASONE PROPIONATE 50 MCG/ACT NA SUSP: 2.0000 | Freq: Every day | NASAL | Status: DC

## 2013-05-18 MED ORDER — METFORMIN HCL 500 MG PO TABS: 500.0000 mg | ORAL_TABLET | Freq: Every day | ORAL | Status: DC

## 2013-05-18 MED ORDER — LOVASTATIN 40 MG PO TABS: 40.0000 mg | ORAL_TABLET | Freq: Every day | ORAL | Status: DC

## 2013-05-21 DIAGNOSIS — L723 Sebaceous cyst: Secondary | ICD-10-CM | POA: Insufficient documentation

## 2013-05-21 NOTE — Assessment & Plan Note (Signed)
Cyst on nape, unable to extrude material in office with direct pressure, pt advised to not traumatize the area as this will increase risk of bacterial suerinfection which she needs to avoid

## 2013-05-21 NOTE — Assessment & Plan Note (Signed)
Controlled, no change in medication Hyperlipidemia:Low fat diet discussed and encouraged.  Updated lab needed at/ before next visit.  

## 2013-05-21 NOTE — Assessment & Plan Note (Signed)
Controlled, no change in medication DASH diet and commitment to daily physical activity for a minimum of 30 minutes discussed and encouraged, as a part of hypertension management. The importance of attaining a healthy weight is also discussed.  

## 2013-05-21 NOTE — Assessment & Plan Note (Signed)
Controlled, no change in medication Patient advised to reduce carb and sweets, commit to regular physical activity, take meds as prescribed, test blood as directed, and attempt to lose weight, to improve blood sugar control.  

## 2013-05-21 NOTE — Assessment & Plan Note (Signed)
Unchanged. Patient re-educated about  the importance of commitment to a  minimum of 150 minutes of exercise per week. The importance of healthy food choices with portion control discussed. Encouraged to start a food diary, count calories and to consider  joining a support group. Sample diet sheets offered. Goals set by the patient for the next several months.    

## 2013-05-21 NOTE — Progress Notes (Signed)
   Subjective:    Patient ID: Angela Simmons, female    DOB: Jun 09, 1945, 68 y.o.   MRN: [id number]  HPI The PT is here for follow up and re-evaluation of chronic medical conditions, medication management and review of any available recent lab and radiology data.  Preventive health is updated, specifically  Cancer screening and Immunization.    The PT denies any adverse reactions to current medications since the last visit.  C/o swelling at back of neck, non tender , called in to be seen due to  Concern about this. Denies drainage or pain in the are, no fevr or chills , no recent trauma. Denies polyuria , polydipsia or hypoglycemic episodes.   Tests blood sugar daily and fasting sugars are seldom over 130       Review of Systems See HPI Denies recent fever or chills. Denies sinus pressure, nasal congestion, ear pain or sore throat. Denies chest congestion, productive cough or wheezing. Denies chest pains, palpitations and leg swelling Denies abdominal pain, nausea, vomiting,diarrhea or constipation.   Denies dysuria, frequency, hesitancy or incontinence. Denies joint pain, swelling and limitation in mobility. Denies headaches, seizures, numbness, or tingling. Denies depression, anxiety or insomnia.       Objective:   Physical Exam BP 130/80  Pulse 76  Resp 16  Wt 204 lb (92.534 kg)  SpO2 99% Patient alert and oriented and in no cardiopulmonary distress.  HEENT: No facial asymmetry, EOMI, no sinus tenderness,  oropharynx pink and moist.  Neck supple no adenopathy.  Chest: Clear to auscultation bilaterally.  CVS: S1, S2 no murmurs, no S3.  ABD: Soft non tender. Bowel sounds normal.  Ext: No edema  MS: Adequate ROM spine, shoulders, hips and knees.  Skin: Intact, no ulcerations or rash noted.Sebaceous cyst on nape, un able to extrude material with direct pressure in office. No bacterial superinfection at time of exam  Psych: Good eye contact, normal affect. Memory  intact not anxious or depressed appearing.  CNS: CN 2-12 intact, power, tone and sensation normal throughout.        Assessment & Plan:  Sebaceous cyst Cyst on nape, unable to extrude material in office with direct pressure, pt advised to not traumatize the area as this will increase risk of bacterial suerinfection which she needs to avoid  Type II or unspecified type diabetes mellitus with neurological manifestations, not stated as uncontrolled(250.60) Controlled, no change in medication Patient advised to reduce carb and sweets, commit to regular physical activity, take meds as prescribed, test blood as directed, and attempt to lose weight, to improve blood sugar control.   ESSENTIAL HYPERTENSION, BENIGN Controlled, no change in medication DASH diet and commitment to daily physical activity for a minimum of 30 minutes discussed and encouraged, as a part of hypertension management. The importance of attaining a healthy weight is also discussed.   OBESITY, UNSPECIFIED Unchanged. Patient re-educated about  the importance of commitment to a  minimum of 150 minutes of exercise per week. The importance of healthy food choices with portion control discussed. Encouraged to start a food diary, count calories and to consider  joining a support group. Sample diet sheets offered. Goals set by the patient for the next several months.     HYPERLIPIDEMIA Controlled, no change in medication Hyperlipidemia:Low fat diet discussed and encouraged.  Updated lab needed at/ before next visit.

## 2013-05-23 ENCOUNTER — Ambulatory Visit: Payer: Medicare HMO | Admitting: Family Medicine

## 2013-07-05 ENCOUNTER — Other Ambulatory Visit (HOSPITAL_COMMUNITY)
Admission: RE | Admit: 2013-07-05 | Discharge: 2013-07-05 | Disposition: A | Discharge status: Home / Self Care | Payer: Medicare HMO | Source: Ambulatory Visit | Attending: Family Medicine | Admitting: Family Medicine

## 2013-07-05 ENCOUNTER — Ambulatory Visit (INDEPENDENT_AMBULATORY_CARE_PROVIDER_SITE_OTHER): Payer: Medicare HMO | Admitting: Family Medicine

## 2013-07-05 ENCOUNTER — Encounter: Payer: Self-pay | Admitting: Family Medicine

## 2013-07-05 VITALS — BP 120/80 | HR 83 | Resp 16 | Ht 69.0 in | Wt 203.8 lb

## 2013-07-05 DIAGNOSIS — E785 Hyperlipidemia, unspecified: Secondary | ICD-10-CM

## 2013-07-05 DIAGNOSIS — I10 Essential (primary) hypertension: Secondary | ICD-10-CM

## 2013-07-05 DIAGNOSIS — Z9011 Acquired absence of right breast and nipple: Secondary | ICD-10-CM

## 2013-07-05 DIAGNOSIS — E119 Type 2 diabetes mellitus without complications: Secondary | ICD-10-CM

## 2013-07-05 DIAGNOSIS — R5381 Other malaise: Secondary | ICD-10-CM

## 2013-07-05 DIAGNOSIS — Z01419 Encounter for gynecological examination (general) (routine) without abnormal findings: Secondary | ICD-10-CM | POA: Insufficient documentation

## 2013-07-05 DIAGNOSIS — R5383 Other fatigue: Secondary | ICD-10-CM

## 2013-07-05 DIAGNOSIS — Z1211 Encounter for screening for malignant neoplasm of colon: Secondary | ICD-10-CM

## 2013-07-05 DIAGNOSIS — Z124 Encounter for screening for malignant neoplasm of cervix: Secondary | ICD-10-CM

## 2013-07-05 DIAGNOSIS — Z1272 Encounter for screening for malignant neoplasm of vagina: Secondary | ICD-10-CM

## 2013-07-05 DIAGNOSIS — Z0000 Encounter for general adult medical examination without abnormal findings: Secondary | ICD-10-CM

## 2013-07-05 DIAGNOSIS — Z1239 Encounter for other screening for malignant neoplasm of breast: Secondary | ICD-10-CM

## 2013-07-05 DIAGNOSIS — E1149 Type 2 diabetes mellitus with other diabetic neurological complication: Secondary | ICD-10-CM

## 2013-07-05 LAB — POC HEMOCCULT BLD/STL (OFFICE/1-CARD/DIAGNOSTIC): Fecal Occult Blood, POC: NEGATIVE

## 2013-07-05 MED ORDER — METFORMIN HCL 500 MG PO TABS: 500.0000 mg | ORAL_TABLET | Freq: Every day | ORAL | Status: DC

## 2013-07-05 NOTE — Patient Instructions (Addendum)
Initial Annual wellness in 3.5 month, call if you need me before   Fasting lipid, cmp and EGFr, hBa1C, cBc, TSH first week in August   You are referred for mammogram due end  August  It is important that you exercise regularly at least 30 minutes 5 times a week. If you develop chest pain, have severe difficulty breathing, or feel very tired, stop exercising immediately and seek medical attention    Weight loss goal of 3 to 5 pounds

## 2013-07-05 NOTE — Progress Notes (Signed)
   Subjective:    Patient ID: Angela Simmons, female    DOB: [birth year], 68 y.o.   MRN: [id number]  HPI Patient is in for annual physical   exam. No other health concerns are expressed or addressed at the visit.    Review of Systems See HPI     Objective:   Physical Exam BP 120/80  Pulse 83  Resp 16  Ht 5\' 9"  (1.753 m)  Wt 203 lb 12.8 oz (92.443 kg)  BMI 30.08 kg/m2  SpO2 99% Pleasant well nourished female, alert and oriented x 3, in no cardio-pulmonary distress. Afebrile. HEENT No facial trauma or asymetry. Sinuses non tender.  EOMI, PERTL, fundoscopic exam  no hemorhage or exudate.  External ears normal, tympanic membranes clear. Oropharynx moist, no exudate, fair dentition. Neck: supple, no adenopathy,JVD or thyromegaly.No bruits.  Chest: Clear to ascultation bilaterally.No crackles or wheezes. Non tender to palpation  Breast: Right mastectomy No no masses or lumps. No tenderness. No nipple discharge or inversion. No axillary or supraclavicular adenopathy  Cardiovascular system; Heart sounds normal,  S1 and  S2 ,no S3.  No murmur, or thrill. Apical beat not displaced Peripheral pulses normal.  Abdomen: Soft, non tender, no organomegaly or masses. No bruits. Bowel sounds normal. No guarding, tenderness or rebound.  Rectal:  Normal sphincter tone. No mass.No rectal masses.  Guaiac negative stool.  GU: External genitalia normal female genitalia , female distribution of hair. No lesions. Urethral meatus normal in size, no  Prolapse, no lesions visibly  Present. Bladder non tender. Vagina pink and moist , with no visible lesions , discharge present . Adequate pelvic support no  cystocele or rectocele noted  Uterus bsent, no adnexal masses, no  adnexal tenderness.   Musculoskeletal exam: Full ROM of spine, hips , shoulders and knees. No deformity ,swelling or crepitus noted. No muscle wasting or atrophy.   Neurologic: Cranial nerves 2 to 12  intact. Power, tone ,sensation and reflexes normal throughout. No disturbance in gait. No tremor.  Skin: Intact, no ulceration, erythema , scaling or rash noted. Pigmentation normal throughout  Psych; Normal mood and affect. Judgement and concentration normal        Assessment & Plan:  Annual physical exam Annual physical  exam as documented. Counseling done  re healthy lifestyle involving commitment to 150 minutes exercise per week, heart healthy diet, and attaining healthy weight.The importance of adequate sleep also discussed. Regular seat belt use  is also discussed.  Immunization and cancer screening needs are specifically addressed at this visit.

## 2013-07-05 NOTE — Assessment & Plan Note (Signed)
Annual physical  exam as documented. Counseling done  re healthy lifestyle involving commitment to 150 minutes exercise per week, heart healthy diet, and attaining healthy weight.The importance of adequate sleep also discussed. Regular seat belt use  is also discussed.  Immunization and cancer screening needs are specifically addressed at this visit.

## 2013-07-07 ENCOUNTER — Other Ambulatory Visit: Payer: Self-pay

## 2013-07-07 LAB — CYTOLOGY - PAP

## 2013-07-10 ENCOUNTER — Other Ambulatory Visit: Payer: Self-pay

## 2013-07-10 DIAGNOSIS — I10 Essential (primary) hypertension: Secondary | ICD-10-CM

## 2013-08-28 ENCOUNTER — Ambulatory Visit: Payer: Medicare HMO | Admitting: Family Medicine

## 2013-08-29 ENCOUNTER — Ambulatory Visit (HOSPITAL_COMMUNITY)
Admission: RE | Admit: 2013-08-29 | Discharge: 2013-08-29 | Disposition: A | Discharge status: Home / Self Care | Payer: Medicare HMO | Source: Ambulatory Visit | Attending: Family Medicine | Admitting: Family Medicine

## 2013-08-29 ENCOUNTER — Ambulatory Visit (INDEPENDENT_AMBULATORY_CARE_PROVIDER_SITE_OTHER): Payer: Medicare HMO | Admitting: Family Medicine

## 2013-08-29 ENCOUNTER — Encounter: Payer: Self-pay | Admitting: Family Medicine

## 2013-08-29 VITALS — BP 126/78 | HR 74 | Resp 18 | Ht 69.0 in | Wt 202.0 lb

## 2013-08-29 DIAGNOSIS — M719 Bursopathy, unspecified: Secondary | ICD-10-CM

## 2013-08-29 DIAGNOSIS — N3010 Interstitial cystitis (chronic) without hematuria: Secondary | ICD-10-CM

## 2013-08-29 DIAGNOSIS — M79609 Pain in unspecified limb: Secondary | ICD-10-CM | POA: Insufficient documentation

## 2013-08-29 DIAGNOSIS — M79602 Pain in left arm: Secondary | ICD-10-CM

## 2013-08-29 DIAGNOSIS — E785 Hyperlipidemia, unspecified: Secondary | ICD-10-CM

## 2013-08-29 DIAGNOSIS — M7580 Other shoulder lesions, unspecified shoulder: Secondary | ICD-10-CM

## 2013-08-29 DIAGNOSIS — M778 Other enthesopathies, not elsewhere classified: Secondary | ICD-10-CM

## 2013-08-29 DIAGNOSIS — M67919 Unspecified disorder of synovium and tendon, unspecified shoulder: Secondary | ICD-10-CM

## 2013-08-29 DIAGNOSIS — M7582 Other shoulder lesions, left shoulder: Principal | ICD-10-CM

## 2013-08-29 DIAGNOSIS — I10 Essential (primary) hypertension: Secondary | ICD-10-CM

## 2013-08-29 DIAGNOSIS — E669 Obesity, unspecified: Secondary | ICD-10-CM

## 2013-08-29 MED ORDER — KETOROLAC TROMETHAMINE 60 MG/2ML IM SOLN
60.0000 mg | Freq: Once | INTRAMUSCULAR | Status: AC
  Administered 2013-08-29: 60 mg via INTRAMUSCULAR

## 2013-08-29 MED ORDER — METHYLPREDNISOLONE ACETATE 80 MG/ML IJ SUSP
80.0000 mg | Freq: Once | INTRAMUSCULAR | Status: AC
  Administered 2013-08-29: 80 mg via INTRAMUSCULAR

## 2013-08-29 MED ORDER — INDOMETHACIN 25 MG PO CAPS: 25.0000 mg | ORAL_CAPSULE | Freq: Three times a day (TID) | ORAL | Status: DC

## 2013-08-29 MED ORDER — PREDNISONE (PAK) 5 MG PO TABS: 5.0000 mg | ORAL_TABLET | ORAL | Status: DC

## 2013-08-29 NOTE — Patient Instructions (Signed)
F/u as before  Anti inflammatories are prescribed for shoulder and arm pain  X rays today please  Call in the next 7 to 10 days if you still have a lot of pain and limitation in left arm mobility, I will refer you to orthopedics

## 2013-09-04 NOTE — Assessment & Plan Note (Signed)
Left shoulder pain, acute , anti inflammmatories Im and oral

## 2013-09-04 NOTE — Assessment & Plan Note (Signed)
Controlled, no change in medication Hyperlipidemia:Low fat diet discussed and encouraged.  Updated lab needed at/ before next visit.  

## 2013-09-04 NOTE — Assessment & Plan Note (Signed)
Controlled, no change in medication DASH diet and commitment to daily physical activity for a minimum of 30 minutes discussed and encouraged, as a part of hypertension management. The importance of attaining a healthy weight is also discussed.  

## 2013-09-04 NOTE — Assessment & Plan Note (Signed)
Left arm pain get x ray of  Arm and antoi inflammatory pills

## 2013-09-04 NOTE — Progress Notes (Signed)
   Subjective:    Patient ID: Angela Simmons, female    DOB: 1945/08/19, 68 y.o.   MRN: [id number]  HPI The PT is here for follow up and re-evaluation of chronic medical conditions, medication management and review of any available recent lab and radiology data.  Preventive health is updated, specifically  Cancer screening and Immunization.   Questions or concerns regarding consultations or procedures which the PT has had in the interim are  addressed. The PT denies any adverse reactions to current medications since the last visit.  4 day h/o left shoulder , upper arm and neck left sided pain    Review of Systems    See HPI Denies recent fever or chills. Denies sinus pressure, nasal congestion, ear pain or sore throat. Denies chest congestion, productive cough or wheezing. Denies chest pains, palpitations and leg swelling Denies abdominal pain, nausea, vomiting,diarrhea or constipation.   Denies dysuria, frequency, hesitancy or incontinence.  Denies headaches, seizures, numbness, or tingling. Denies depression, anxiety or insomnia. Denies skin break down or rash.     Objective:   Physical Exam BP 126/78  Pulse 74  Resp 18  Ht 5\' 9"  (1.753 m)  Wt 202 lb (91.627 kg)  BMI 29.82 kg/m2  SpO2 98% Patient alert and oriented and in no cardiopulmonary distress.Pt in pain  HEENT: No facial asymmetry, EOMI,   oropharynx pink and moist.  Neck decreased ROM no JVD, no mass.  Chest: Clear to auscultation bilaterally.  CVS: S1, S2 no murmurs, no S3.Regular rate.  ABD: Soft non tender.   Ext: No edema  MS: Adequate ROM spine,  hips and knees.Left shoulder  Reduced ROM and tenderness in right upper arm   Skin: Intact, no ulcerations or rash noted.  Psych: Good eye contact, normal affect. Memory intact not anxious or depressed appearing.  CNS: CN 2-12 intact, power,  normal throughout.no focal deficits noted.        Assessment & Plan:  Tendonitis of shoulder Left  shoulder pain, acute , anti inflammmatories Im and oral  HYPERLIPIDEMIA Controlled, no change in medication Hyperlipidemia:Low fat diet discussed and encouraged.  Updated lab needed at/ before next visit.     OBESITY, UNSPECIFIED unchanged Patient re-educated about  the importance of commitment to a  minimum of 150 minutes of exercise per week. The importance of healthy food choices with portion control discussed. Encouraged to start a food diary, count calories and to consider  joining a support group. Sample diet sheets offered. Goals set by the patient for the next several months.     INTERSTITIAL CYSTITIS No recent flare  GERD Controlled, no change in medication   ESSENTIAL HYPERTENSION, BENIGN Controlled, no change in medication DASH diet and commitment to daily physical activity for a minimum of 30 minutes discussed and encouraged, as a part of hypertension management. The importance of attaining a healthy weight is also discussed.   Arm pain, left Left arm pain get x ray of  Arm and antoi inflammatory pills

## 2013-09-04 NOTE — Assessment & Plan Note (Signed)
Controlled, no change in medication  

## 2013-09-04 NOTE — Assessment & Plan Note (Signed)
No recent flare.  

## 2013-09-04 NOTE — Assessment & Plan Note (Signed)
unchanged Patient re-educated about  the importance of commitment to a  minimum of 150 minutes of exercise per week. The importance of healthy food choices with portion control discussed. Encouraged to start a food diary, count calories and to consider  joining a support group. Sample diet sheets offered. Goals set by the patient for the next several months.    

## 2013-09-18 ENCOUNTER — Ambulatory Visit (HOSPITAL_COMMUNITY)
Admission: RE | Admit: 2013-09-18 | Discharge: 2013-09-18 | Disposition: A | Discharge status: Home / Self Care | Payer: Medicare HMO | Source: Ambulatory Visit | Attending: Family Medicine | Admitting: Family Medicine

## 2013-09-18 DIAGNOSIS — Z1239 Encounter for other screening for malignant neoplasm of breast: Secondary | ICD-10-CM

## 2013-09-18 DIAGNOSIS — Z1231 Encounter for screening mammogram for malignant neoplasm of breast: Secondary | ICD-10-CM | POA: Insufficient documentation

## 2013-09-18 DIAGNOSIS — Z9011 Acquired absence of right breast and nipple: Secondary | ICD-10-CM

## 2013-10-10 ENCOUNTER — Telehealth: Payer: Self-pay | Admitting: Family Medicine

## 2013-10-10 DIAGNOSIS — M79602 Pain in left arm: Secondary | ICD-10-CM

## 2013-10-10 DIAGNOSIS — M7582 Other shoulder lesions, left shoulder: Secondary | ICD-10-CM

## 2013-10-10 DIAGNOSIS — M778 Other enthesopathies, not elsewhere classified: Secondary | ICD-10-CM

## 2013-10-10 NOTE — Telephone Encounter (Signed)
Patient referred.

## 2013-10-24 ENCOUNTER — Other Ambulatory Visit: Payer: Self-pay

## 2013-10-24 DIAGNOSIS — E119 Type 2 diabetes mellitus without complications: Secondary | ICD-10-CM

## 2013-10-24 MED ORDER — METFORMIN HCL 500 MG PO TABS: 500.0000 mg | ORAL_TABLET | Freq: Every day | ORAL | Status: DC

## 2013-11-21 ENCOUNTER — Other Ambulatory Visit: Payer: Self-pay | Admitting: Family Medicine

## 2013-11-21 LAB — LIPID PANEL
CHOL/HDL RATIO: 3.2 ratio
Cholesterol: 139 mg/dL (ref 0–200)
HDL: 44 mg/dL (ref >39)
LDL CALC: 83 mg/dL (ref 0–99)
TRIGLYCERIDES: 60 mg/dL (ref <150)
VLDL: 12 mg/dL (ref 0–40)

## 2013-11-21 LAB — CBC WITH DIFFERENTIAL/PLATELET
BASOS ABS: 0 10*3/uL (ref 0.0–0.1)
Basophils Relative: 0 % (ref 0–1)
Eosinophils Absolute: 0 10*3/uL (ref 0.0–0.7)
Eosinophils Relative: 1 % (ref 0–5)
HCT: 35.5 % — ABNORMAL LOW (ref 36.0–46.0)
Hemoglobin: 11.6 g/dL — ABNORMAL LOW (ref 12.0–15.0)
Lymphocytes Relative: 29 % (ref 12–46)
Lymphs Abs: 1.1 10*3/uL (ref 0.7–4.0)
MCH: 29.6 pg (ref 26.0–34.0)
MCHC: 32.7 g/dL (ref 30.0–36.0)
MCV: 90.6 fL (ref 78.0–100.0)
Monocytes Absolute: 0.3 10*3/uL (ref 0.1–1.0)
Monocytes Relative: 8 % (ref 3–12)
NEUTROS ABS: 2.4 10*3/uL (ref 1.7–7.7)
Neutrophils Relative %: 62 % (ref 43–77)
PLATELETS: 253 10*3/uL (ref 150–400)
RBC: 3.92 MIL/uL (ref 3.87–5.11)
RDW: 14.9 % (ref 11.5–15.5)
WBC: 3.9 10*3/uL — AB (ref 4.0–10.5)

## 2013-11-21 LAB — COMPLETE METABOLIC PANEL WITH GFR
ALK PHOS: 58 U/L (ref 39–117)
ALT: 10 U/L (ref 0–35)
AST: 13 U/L (ref 0–37)
Albumin: 3.9 g/dL (ref 3.5–5.2)
BILIRUBIN TOTAL: 0.4 mg/dL (ref 0.2–1.2)
BUN: 11 mg/dL (ref 6–23)
CO2: 26 mEq/L (ref 19–32)
Calcium: 9 mg/dL (ref 8.4–10.5)
Chloride: 105 mEq/L (ref 96–112)
Creat: 0.9 mg/dL (ref 0.50–1.10)
GFR, EST AFRICAN AMERICAN: 77 mL/min
GFR, EST NON AFRICAN AMERICAN: 66 mL/min
GLUCOSE: 89 mg/dL (ref 70–99)
Potassium: 4.2 mEq/L (ref 3.5–5.3)
SODIUM: 140 meq/L (ref 135–145)
Total Protein: 7.1 g/dL (ref 6.0–8.3)

## 2013-11-21 LAB — HEMOGLOBIN A1C
Hgb A1c MFr Bld: 6.6 % — ABNORMAL HIGH (ref <5.7)
Mean Plasma Glucose: 143 mg/dL — ABNORMAL HIGH (ref <117)

## 2013-11-21 LAB — TSH: TSH: 2.734 u[IU]/mL (ref 0.350–4.500)

## 2013-11-22 LAB — FERRITIN: Ferritin: 97 ng/mL (ref 10–291)

## 2013-11-22 LAB — IRON: Iron: 69 ug/dL (ref 42–145)

## 2013-11-23 ENCOUNTER — Ambulatory Visit (INDEPENDENT_AMBULATORY_CARE_PROVIDER_SITE_OTHER): Payer: Commercial Managed Care - HMO | Admitting: Family Medicine

## 2013-11-23 ENCOUNTER — Encounter: Payer: Self-pay | Admitting: Family Medicine

## 2013-11-23 ENCOUNTER — Encounter (INDEPENDENT_AMBULATORY_CARE_PROVIDER_SITE_OTHER): Payer: Self-pay

## 2013-11-23 VITALS — BP 130/82 | HR 86 | Resp 16 | Ht 69.0 in | Wt 199.4 lb

## 2013-11-23 DIAGNOSIS — Z23 Encounter for immunization: Secondary | ICD-10-CM | POA: Diagnosis not present

## 2013-11-23 DIAGNOSIS — Z0000 Encounter for general adult medical examination without abnormal findings: Secondary | ICD-10-CM

## 2013-11-23 DIAGNOSIS — E1140 Type 2 diabetes mellitus with diabetic neuropathy, unspecified: Secondary | ICD-10-CM | POA: Diagnosis not present

## 2013-11-23 NOTE — Progress Notes (Signed)
Subjective:    Patient ID: Angela Simmons, female    DOB: 08-11-1945, 68 y.o.   MRN: [id number]  HPI Preventive Screening-Counseling & Management   Patient present here today for an initial medicare annual wellness visit. Diabetes care is updated, foot exam which is due is done and recent labs reviewed Flu vaccine is administered   Current Problems (verified)   Medications Prior to Visit Allergies (verified)   PAST HISTORY  Family History (verified)   Social History married for 21 years, 3 children and retired, no h/o drug dependence   Risk Factors  Current exercise habits:  Works out at [employer] and walks several days a week   Dietary issues discussed: Heart healthy diet discussed- to eat more fruits and vegetables and to limit fried foods and carbs    Cardiac risk factors: mother had heart failure , pt is diabetic Depression Screen  (Note: if answer to either of the following is "Yes", a more complete depression screening is indicated)   Over the past two weeks, have you felt down, depressed or hopeless? A little, mainly due to finances  Over the past two weeks, have you felt little interest or pleasure in doing things? No  Have you lost interest or pleasure in daily life? No  Do you often feel hopeless? No  Do you cry easily over simple problems? Sometimes   Activities of Daily Living  In your present state of health, do you have any difficulty performing the following activities?  Driving?: No Managing money?: No Feeding yourself?:No Getting from bed to chair?:No Climbing a flight of stairs?:No Preparing food and eating?:No Bathing or showering?:No Getting dressed?:No Getting to the toilet?:No Using the toilet?:No Moving around from place to place?: No  Fall Risk Assessment In the past year have you fallen or had a near fall?:No, unclear if she hit shoulder on wall, has been in therapy Are you currently taking any medications that make you  dizzy?:No   Hearing Difficulties: No Do you often ask people to speak up or repeat themselves? Sometimes  Do you experience ringing or noises in your ears?:No Do you have difficulty understanding soft or whispered voices?: sometimes   Cognitive Testing  Alert? Yes Normal Appearance?Yes  Oriented to person? Yes Place? Yes  Time? Yes  Displays appropriate judgment?Yes  Can read the correct time from a watch face? yes Are you having problems remembering things? no  Advanced Directives have been discussed with the patient?Yes, brochure given. States she does have a will , she is afull code   List the Names of Other Physician/Practitioners you currently use:  Dr Lorin Mercy (ortho)   Indicate any recent Medical Services you may have received from other than [facility] providers in the past year (date may be approximate).   Assessment:    Annual Wellness Exam , initial  Plan:    Medicare Attestation  I have personally reviewed:  The patient's medical and social history  Their use of alcohol, tobacco or illicit drugs  Their current medications and supplements  The patient's functional ability including ADLs,fall risks, home safety risks, cognitive, and hearing and visual impairment  Diet and physical activities  Evidence for depression or mood disorders  The patient's weight, height, BMI, and visual acuity have been recorded in the chart. I have made referrals, counseling, and provided education to the patient based on review of the above and I have provided the patient with a written personalized care plan for preventive services.  Review of Systems     Objective:   Physical Exam BP 130/82 mmHg  Pulse 86  Resp 16  Ht 5\' 9"  (1.753 m)  Wt 199 lb 6.4 oz (90.447 kg)  BMI 29.43 kg/m2  SpO2 99%        Assessment & Plan:  Medicare annual wellness visit, initial Annual exam as documented. Counseling done  re healthy lifestyle involving commitment to 150 minutes exercise per  week, heart healthy diet, and attaining healthy weight.The importance of adequate sleep also discussed. Regular seat belt use and home safety, is also discussed. Changes in health habits are decided on by the patient with goals and time frames  set for achieving them. Immunization and cancer screening needs are specifically addressed at this visit.   Type 2 diabetes, controlled, with neuropathy Controlled, no change in medication Patient advised to reduce carb and sweets, commit to regular physical activity, take meds as prescribed, test blood as directed, and attempt to lose weight, to improve blood sugar control.   Need for prophylactic vaccination and inoculation against influenza Vaccine administered at visit.

## 2013-11-23 NOTE — Patient Instructions (Addendum)
F/u in 4 month, call if you need me before  Flu vaccine today  Microalb from office today  Foot exam shows that you have no sensation under your feet, so IMPORTANT that you EXAMINE feet every day to ensure no cuts or sores  Discuss living will further with your husband      Labs are excellent  No changes in medcation  You are referre for eye exam with Dr  Iona Hansen  hBa1C , chem 7 andEGFR in 4 months  Fall Prevention and Home Safety Falls cause injuries and can affect all age groups. It is possible to prevent falls.  HOW TO PREVENT FALLS  Wear shoes with rubber soles that do not have an opening for your toes.  Keep the inside and outside of your house well lit.  Use night lights throughout your home.  Remove clutter from floors.  Clean up floor spills.  Remove throw rugs or fasten them to the floor with carpet tape.  Do not place electrical cords across pathways.  Put grab bars by your tub, shower, and toilet. Do not use towel bars as grab bars.  Put handrails on both sides of the stairway. Fix loose handrails.  Do not climb on stools or stepladders, if possible.  Do not wax your floors.  Repair uneven or unsafe sidewalks, walkways, or stairs.  Keep items you use a lot within reach.  Be aware of pets.  Keep emergency numbers next to the telephone.  Put smoke detectors in your home and near bedrooms. Ask your doctor what other things you can do to prevent falls. Document Released: 11/01/2008 Document Revised: 07/07/2011 Document Reviewed: 04/07/2011 [facility] Patient Information 2015 [place], [place]. This information is not intended to replace advice given to you by your health care provider. Make sure you discuss any questions you have with your health care provider.

## 2013-11-23 NOTE — Assessment & Plan Note (Signed)

## 2013-11-24 ENCOUNTER — Other Ambulatory Visit: Payer: Self-pay

## 2013-11-24 DIAGNOSIS — I10 Essential (primary) hypertension: Secondary | ICD-10-CM

## 2013-11-24 DIAGNOSIS — E785 Hyperlipidemia, unspecified: Secondary | ICD-10-CM

## 2013-11-24 MED ORDER — AMLODIPINE BESYLATE 10 MG PO TABS: 10.0000 mg | ORAL_TABLET | Freq: Every day | ORAL | Status: DC

## 2013-11-24 MED ORDER — PANTOPRAZOLE SODIUM 40 MG PO TBEC: 40.0000 mg | DELAYED_RELEASE_TABLET | Freq: Every day | ORAL | Status: DC

## 2013-11-24 MED ORDER — CLONIDINE HCL 0.1 MG PO TABS: 0.1000 mg | ORAL_TABLET | Freq: Every day | ORAL | Status: DC

## 2013-11-24 MED ORDER — LOVASTATIN 40 MG PO TABS: 40.0000 mg | ORAL_TABLET | Freq: Every day | ORAL | Status: DC

## 2013-11-24 MED ORDER — BENAZEPRIL HCL 40 MG PO TABS: 40.0000 mg | ORAL_TABLET | Freq: Every day | ORAL | Status: DC

## 2013-11-25 LAB — MICROALBUMIN / CREATININE URINE RATIO
CREATININE, URINE: 86.1 mg/dL
MICROALB UR: 0.5 mg/dL (ref <2.0)
Microalb Creat Ratio: 5.8 mg/g (ref 0.0–30.0)

## 2013-12-13 LAB — HM DIABETES EYE EXAM

## 2014-01-02 ENCOUNTER — Telehealth: Payer: Self-pay | Admitting: *Deleted

## 2014-01-02 ENCOUNTER — Ambulatory Visit (INDEPENDENT_AMBULATORY_CARE_PROVIDER_SITE_OTHER): Payer: Medicare HMO | Admitting: Family Medicine

## 2014-01-02 ENCOUNTER — Encounter: Payer: Self-pay | Admitting: Family Medicine

## 2014-01-02 VITALS — BP 120/78 | HR 95 | Temp 98.8°F | Resp 16 | Ht 69.0 in | Wt 196.0 lb

## 2014-01-02 DIAGNOSIS — I10 Essential (primary) hypertension: Secondary | ICD-10-CM

## 2014-01-02 DIAGNOSIS — J209 Acute bronchitis, unspecified: Secondary | ICD-10-CM

## 2014-01-02 DIAGNOSIS — J0100 Acute maxillary sinusitis, unspecified: Secondary | ICD-10-CM

## 2014-01-02 DIAGNOSIS — J020 Streptococcal pharyngitis: Secondary | ICD-10-CM

## 2014-01-02 LAB — POCT RAPID STREP A (OFFICE): RAPID STREP A SCREEN: POSITIVE — AB

## 2014-01-02 MED ORDER — FIRST-DUKES MOUTHWASH MT SUSP: OROMUCOSAL | Status: DC

## 2014-01-02 MED ORDER — CLARITHROMYCIN 250 MG PO TABS: 250.0000 mg | ORAL_TABLET | Freq: Two times a day (BID) | ORAL | Status: DC

## 2014-01-02 MED ORDER — HYDROCODONE-ACETAMINOPHEN 5-325 MG PO TABS: ORAL_TABLET | ORAL | Status: DC

## 2014-01-02 MED ORDER — BENZONATATE 100 MG PO CAPS: 100.0000 mg | ORAL_CAPSULE | Freq: Two times a day (BID) | ORAL | Status: DC | PRN

## 2014-01-02 MED ORDER — ERYTHROMYCIN BASE 500 MG PO TABS: 500.0000 mg | ORAL_TABLET | Freq: Four times a day (QID) | ORAL | Status: DC

## 2014-01-02 NOTE — Patient Instructions (Addendum)
F/u as before  You have strep throat infection as well as sinuusitis and bronchitis.  Take ALL erythromycin prescribed   Also pain pills are prescribed for as needed use and a mouthwash for throat pain  Decongestant pills   Do NOT TAKE cholesterol med while on the antibiotic, stop lovastatin for 10 days

## 2014-01-02 NOTE — Telephone Encounter (Signed)
Pt called stating she needs to be seen today per pt she is sick and she is coughing up mucus, pt said her eyes and her ears are bothering her. Please advise

## 2014-01-02 NOTE — Telephone Encounter (Signed)
Patient to be seen this evening at 1:30

## 2014-01-02 NOTE — Telephone Encounter (Signed)
Called and spoke with patient.   She states that symptoms started 4 days ago.  She has sore throat, cough, congestion with yellow sputum, fever/chills.  As well as some redness to left eye.  No open slots until Thursday.  Please advise.

## 2014-01-15 ENCOUNTER — Telehealth: Payer: Self-pay

## 2014-01-15 NOTE — Telephone Encounter (Signed)
pls send in [facility]'s mouthwash 10 cc tid gargle, swish and swallow 3 times daily x 10 days. Offer rapid strep swab on 01/17/2014, if neg will send for c/s . No report of fever or chill , if so and feels really ill then go to UC or ED

## 2014-01-17 NOTE — Telephone Encounter (Signed)
Patient reports that throat feels a lot better.  Will call office if she needs additional support

## 2014-01-21 DIAGNOSIS — J209 Acute bronchitis, unspecified: Secondary | ICD-10-CM | POA: Insufficient documentation

## 2014-01-21 DIAGNOSIS — J0100 Acute maxillary sinusitis, unspecified: Secondary | ICD-10-CM | POA: Insufficient documentation

## 2014-01-21 NOTE — Assessment & Plan Note (Signed)
Decongestant and antibiotic prescribed 

## 2014-01-21 NOTE — Progress Notes (Signed)
   Subjective:    Patient ID: Angela Simmons, female    DOB: May 26, 1945, 69 y.o.   MRN: [id number]  HPI 1 week h/o worsening head and chest congestion, associated with fever and chills intermittently. Nasal drainage has thickened , and is yellowish green, and at times bloody. Sputum is thick and yellow. 2 day h/o severe sore throat with fever , denies neck stiffness. Increasing fatigue , poor appetitie and sleep disturbed by cough. No improvement with OTC medication.    Review of Systems See HPI  Denies abdominal pain, nausea, vomiting,diarrhea or constipation.    C/o generalized  joint pain,  Denies swelling and limitation in mobility. Denies headaches, seizures, numbness, or tingling. Denies depression, anxiety or insomnia. Denies skin break down or rash.        Objective:   Physical Exam BP 120/78 mmHg  Pulse 95  Temp(Src) 98.8 F (37.1 C) (Oral)  Resp 16  Ht 5\' 9"  (1.753 m)  Wt 196 lb (88.905 kg)  BMI 28.93 kg/m2  SpO2 96% Patient alert and oriented and in no cardiopulmonary distress.ill appearing  HEENT: No facial asymmetry, EOMI,   oropharynx erythematous with thick white exudate in left oropharyngeal area, bilateral anterior cervical adenitis and bilateral maxillary tenderness  Chest: adequate air entry, bilateral basilar crackles, no wheezes  CVS: S1, S2 no murmurs, no S3.Regular rate.  ABD: Soft non tender.   Ext: No edema  MS: Adequate ROM spine, shoulders, hips and knees.  Skin: Intact, no ulcerations or rash noted.  Psych: Good eye contact, normal affect. Memory intact not anxious or depressed appearing.  CNS: CN 2-12 intact, power,  normal throughout.no focal deficits noted.        Assessment & Plan:  Strep pharyngitis E mycin preswcribed for 10 days and isolation recommended as much as possible to reduce spread  Acute bronchitis Decongestant and antibiotic prescribed  Acute maxillary sinusitis Antibiotic prescribed  ESSENTIAL  HYPERTENSION, BENIGN Controlled, no change in medication

## 2014-01-21 NOTE — Assessment & Plan Note (Signed)
Controlled, no change in medication  

## 2014-01-21 NOTE — Assessment & Plan Note (Signed)
Antibiotic prescribed 

## 2014-01-21 NOTE — Assessment & Plan Note (Signed)
E mycin preswcribed for 10 days and isolation recommended as much as possible to reduce spread

## 2014-01-23 ENCOUNTER — Other Ambulatory Visit: Payer: Self-pay

## 2014-01-23 ENCOUNTER — Telehealth: Payer: Self-pay | Admitting: *Deleted

## 2014-01-23 DIAGNOSIS — J020 Streptococcal pharyngitis: Secondary | ICD-10-CM

## 2014-01-23 MED ORDER — FIRST-DUKES MOUTHWASH MT SUSP: OROMUCOSAL | Status: DC

## 2014-01-23 NOTE — Telephone Encounter (Signed)
Patient's daughter called with patient on line stating that she has been sick x 2 weeks and has received no call back.  Reminded patient that she had spoke with office on 12/30 and that she stated that she was feeling much better.  Offered patient refill on [facility] mouthwash and asked for her to contact office is she continued to have any problems.  Main complaint today is hoarseness.

## 2014-01-23 NOTE — Telephone Encounter (Signed)
Pt called stating she is still stopped up but her voice is better, pt has a question diabetic medication. Please advise

## 2014-01-29 DIAGNOSIS — Z23 Encounter for immunization: Secondary | ICD-10-CM | POA: Insufficient documentation

## 2014-01-29 NOTE — Assessment & Plan Note (Signed)
Controlled, no change in medication Patient advised to reduce carb and sweets, commit to regular physical activity, take meds as prescribed, test blood as directed, and attempt to lose weight, to improve blood sugar control.  

## 2014-01-29 NOTE — Assessment & Plan Note (Signed)
Vaccine administered at visit.  

## 2014-03-27 ENCOUNTER — Ambulatory Visit: Payer: Medicare HMO | Admitting: Family Medicine

## 2014-04-10 LAB — COMPLETE METABOLIC PANEL WITH GFR
ALT: 13 U/L (ref 0–35)
AST: 13 U/L (ref 0–37)
Albumin: 4 g/dL (ref 3.5–5.2)
Alkaline Phosphatase: 59 U/L (ref 39–117)
BUN: 12 mg/dL (ref 6–23)
CHLORIDE: 105 meq/L (ref 96–112)
CO2: 26 mEq/L (ref 19–32)
Calcium: 9.3 mg/dL (ref 8.4–10.5)
Creat: 0.8 mg/dL (ref 0.50–1.10)
GFR, Est African American: 88 mL/min
GFR, Est Non African American: 77 mL/min
GLUCOSE: 89 mg/dL (ref 70–99)
POTASSIUM: 4.3 meq/L (ref 3.5–5.3)
Sodium: 142 mEq/L (ref 135–145)
TOTAL PROTEIN: 7.5 g/dL (ref 6.0–8.3)
Total Bilirubin: 0.4 mg/dL (ref 0.2–1.2)

## 2014-04-10 LAB — HEMOGLOBIN A1C
HEMOGLOBIN A1C: 6.3 % — AB (ref <5.7)
MEAN PLASMA GLUCOSE: 134 mg/dL — AB (ref <117)

## 2014-04-11 ENCOUNTER — Ambulatory Visit (INDEPENDENT_AMBULATORY_CARE_PROVIDER_SITE_OTHER): Payer: Medicare HMO | Admitting: Family Medicine

## 2014-04-11 ENCOUNTER — Other Ambulatory Visit: Payer: Self-pay

## 2014-04-11 ENCOUNTER — Encounter: Payer: Self-pay | Admitting: Family Medicine

## 2014-04-11 VITALS — BP 120/78 | HR 82 | Resp 16 | Ht 69.0 in | Wt 197.0 lb

## 2014-04-11 DIAGNOSIS — Z23 Encounter for immunization: Secondary | ICD-10-CM

## 2014-04-11 DIAGNOSIS — N898 Other specified noninflammatory disorders of vagina: Secondary | ICD-10-CM | POA: Diagnosis not present

## 2014-04-11 DIAGNOSIS — I10 Essential (primary) hypertension: Secondary | ICD-10-CM

## 2014-04-11 DIAGNOSIS — R079 Chest pain, unspecified: Secondary | ICD-10-CM | POA: Insufficient documentation

## 2014-04-11 DIAGNOSIS — E785 Hyperlipidemia, unspecified: Secondary | ICD-10-CM | POA: Diagnosis not present

## 2014-04-11 DIAGNOSIS — E1140 Type 2 diabetes mellitus with diabetic neuropathy, unspecified: Secondary | ICD-10-CM

## 2014-04-11 DIAGNOSIS — N9089 Other specified noninflammatory disorders of vulva and perineum: Secondary | ICD-10-CM

## 2014-04-11 DIAGNOSIS — E119 Type 2 diabetes mellitus without complications: Secondary | ICD-10-CM

## 2014-04-11 DIAGNOSIS — N3000 Acute cystitis without hematuria: Secondary | ICD-10-CM

## 2014-04-11 DIAGNOSIS — R0789 Other chest pain: Secondary | ICD-10-CM

## 2014-04-11 DIAGNOSIS — K219 Gastro-esophageal reflux disease without esophagitis: Secondary | ICD-10-CM

## 2014-04-11 DIAGNOSIS — E663 Overweight: Secondary | ICD-10-CM

## 2014-04-11 LAB — POCT URINALYSIS DIPSTICK
Bilirubin, UA: NEGATIVE
GLUCOSE UA: NEGATIVE
KETONES UA: NEGATIVE
Nitrite, UA: NEGATIVE
PH UA: 6.5
Protein, UA: NEGATIVE
RBC UA: NEGATIVE
SPEC GRAV UA: 1.015
Urobilinogen, UA: 0.2

## 2014-04-11 MED ORDER — METFORMIN HCL 500 MG PO TABS: 500.0000 mg | ORAL_TABLET | Freq: Every day | ORAL | Status: DC

## 2014-04-11 MED ORDER — AMLODIPINE BESYLATE 10 MG PO TABS: 10.0000 mg | ORAL_TABLET | Freq: Every day | ORAL | Status: DC

## 2014-04-11 MED ORDER — BENAZEPRIL HCL 40 MG PO TABS: 40.0000 mg | ORAL_TABLET | Freq: Every day | ORAL | Status: DC

## 2014-04-11 MED ORDER — PANTOPRAZOLE SODIUM 40 MG PO TBEC: 40.0000 mg | DELAYED_RELEASE_TABLET | Freq: Every day | ORAL | Status: DC

## 2014-04-11 NOTE — Patient Instructions (Signed)
F/u in 4 month, call if you need me before  Prevnar today  EKG is entirely normal ,no sign of heart damage.  We will follow up on urine to see if you have an infection that needs treatment , and will call you if this is the case  Blood sugar improved, great  PLS commit to exercise 30 mins 5 days per week, you can work up to this  You are referred to Dr Glo Herring for gyne exam, based on complaints voiced  Use tylenol for low back pain if needed, and do not over exert yourself, and lift carefully when you ned to lift  Fasting lipid, cmp and EGFr, and hBa1C in 4 month

## 2014-04-11 NOTE — Progress Notes (Signed)
Angela Simmons     MRN: [id number]      DOB: June 25, 1945   HPI Angela Simmons is here for follow up and re-evaluation of chronic medical conditions, medication management and review of any available recent lab and radiology data.  Preventive health is updated, specifically  Cancer screening and Immunization.   Questions or concerns regarding consultations or procedures which the PT has had in the interim are  addressed. The PT denies any adverse reactions to current medications since the last visit.  Currently walking 3 times per week for 30 mins total Intermittent upper chest pain , aggravated by activity and bending over x 6 weeks Soreness in low back yesterday , she did a lot of laundry yesterday, thinks she may have over done the activity Non radiaiting Denies urinary symptoms C/o vaginal "knots" , painless, approx 3 months, may be enlarging, will refer to gyne  ROS Denies recent fever or chills. Increased  sinus pressure, nasal congestion, denies  ear pain or sore throat. Denies chest congestion, productive cough or wheezing. C/o chest chest pain,denies  Palpitations, PND, orthopnea  and leg swelling Denies abdominal pain, nausea, vomiting,diarrhea or constipation.   Denies dysuria, frequency, hesitancy or incontinence. C/o low back pain and left shoulder stiffness Denies headaches, seizures, numbness, or tingling. Denies depression, anxiety or insomnia. Denies skin break down or rash.   PE  BP 120/78 mmHg  Pulse 82  Resp 16  Ht 5\' 9"  (1.753 m)  Wt 197 lb (89.359 kg)  BMI 29.08 kg/m2  SpO2 99%  Patient alert and oriented and in no cardiopulmonary distress.  HEENT: No facial asymmetry, EOMI,   oropharynx pink and moist.  Neck supple no JVD, no mass.  Chest: Clear to auscultation bilaterally.No reproducible chest wall pain CVS: S1, S2 no murmurs, no S3.Regular rate. EKG: NSR, no ischemia or LVH ABD: Soft non tender.  Genital : defered Ext: No edema  MS: decreased  though adequate   ROM lumbar spine, and left  shoulders, hips and knees.  Skin: Intact, no ulcerations or rash noted.  Psych: Good eye contact, normal affect. Memory intact not anxious or depressed appearing.  CNS: CN 2-12 intact, power,  normal throughout.no focal deficits noted.   Assessment & Plan   Chest pain, atypical Non reproducible, office EKGshows NSR , NO LVH ,  No ischemic changes Likely from reflux  Vulvar lesion C/o multiple lesions in the area which see,m to be enlarging, gyne to eval and treat Exam deferred as pt has significant limitation in mobility  Overweight (BMI 25.0-29.9) Unchnaged Patient re-educated about  the importance of commitment to a  minimum of 150 minutes of exercise per week.  The importance of healthy food choices with portion control discussed. Encouraged to start a food diary, count calories and to consider  joining a support group. Sample diet sheets offered. Goals set by the patient for the next several months.   Weight /BMI 04/27/2014 04/11/2014 01/02/2014  WEIGHT 197 lb 197 lb 196 lb  HEIGHT 5' 9.5" 5\' 9"  5\' 9"   BMI 28.68 kg/m2 29.08 kg/m2 28.93 kg/m2    Current exercise per week 90 minutes.  Type 2 diabetes, controlled, with neuropathy Controlled, no change in medication Patient educated about the importance of limiting  Carbohydrate intake , the need to commit to daily physical activity for a minimum of 30 minutes , and to commit weight loss. The fact that changes in all these areas will reduce or eliminate all together the development  of diabetes is stressed.   Diabetic Labs Latest Ref Rng 04/09/2014 11/23/2013 11/21/2013 05/16/2013 01/20/2013  HbA1c <5.7 % 6.3(H) - 6.6(H) 6.5(H) 6.4(H)  Microalbumin <2.0 mg/dL - 0.5 - - -  Micro/Creat Ratio 0.0 - 30.0 mg/g - 5.8 - - -  Chol 0 - 200 mg/dL - - 139 - 133  HDL >39 mg/dL - - 44 - 41  Calc LDL 0 - 99 mg/dL - - 83 - 82  Triglycerides <150 mg/dL - - 60 - 49  Creatinine 0.50 - 1.10 mg/dL 0.80  - 0.90 0.91 0.95   BP/Weight 04/27/2014 04/11/2014 01/02/2014 11/23/2013 08/29/2013 07/05/2013 5/70/1779  Systolic BP 390 300 923 300 762 263 335  Diastolic BP 456 78 78 82 78 80 80  Wt. (Lbs) 197 197 196 199.4 202 203.8 204  BMI 28.68 29.08 28.93 29.43 29.82 30.08 30.11   Foot/eye exam completion dates Latest Ref Rng 12/13/2013 11/23/2013  Eye Exam No Retinopathy No Retinopathy -  Foot Form Completion - - Done       Essential hypertension, benign Controlled, no change in medication DASH diet and commitment to daily physical activity for a minimum of 30 minutes discussed and encouraged, as a part of hypertension management. The importance of attaining a healthy weight is also discussed.  BP/Weight 04/27/2014 04/11/2014 01/02/2014 11/23/2013 08/29/2013 07/05/2013 2/56/3893  Systolic BP 734 287 681 157 262 035 597  Diastolic BP 416 78 78 82 78 80 80  Wt. (Lbs) 197 197 196 199.4 202 203.8 204  BMI 28.68 29.08 28.93 29.43 29.82 30.08 30.11        Hyperlipidemia with target LDL less than 100 Controlled, no change in medication Hyperlipidemia:Low fat diet discussed and encouraged.   Lipid Panel  Lab Results  Component Value Date   CHOL 139 11/21/2013   HDL 44 11/21/2013   LDLCALC 83 11/21/2013   TRIG 60 11/21/2013   CHOLHDL 3.2 11/21/2013      Updated lab needed at/ before next visit.   Need for vaccination with 13-polyvalent pneumococcal conjugate vaccine After obtaining informed consent, the vaccine is  administered by LPN.   GERD Importance of talking medication daily discussed as this is most likely cause of chest pain, also decreased caffeine intake

## 2014-04-11 NOTE — Assessment & Plan Note (Signed)
Non reproducible, office EKGshows NSR , NO LVH ,  No ischemic changes Likely from reflux

## 2014-04-27 ENCOUNTER — Encounter: Payer: Self-pay | Admitting: Obstetrics and Gynecology

## 2014-04-27 ENCOUNTER — Ambulatory Visit (INDEPENDENT_AMBULATORY_CARE_PROVIDER_SITE_OTHER): Payer: Commercial Managed Care - HMO | Admitting: Obstetrics and Gynecology

## 2014-04-27 VITALS — BP 160/100 | Ht 69.5 in | Wt 197.0 lb

## 2014-04-27 DIAGNOSIS — L700 Acne vulgaris: Secondary | ICD-10-CM | POA: Diagnosis not present

## 2014-04-27 NOTE — Progress Notes (Signed)
Patient ID: Angela Simmons, female   DOB: [birth year], 69 y.o.   MRN: [id number]  This chart was SCRIBED for Mallory Shirk, MD by Stephania Fragmin, ED Scribe. This patient was seen in room 1 and the patient's care was started at 9:51 AM.    [facility] Visit  Patient name: Angela Simmons MRN [id number]  Date of birth: 1945/01/31  CC & HPI:  Angela Simmons is a 69 y.o. female presenting today for 2 complaints.  Pt here today from Dr. Moshe Cipro for a constant "knot" on her bottom. Pt states that the area is sometimes sore. Pt states that she has had the area for quite a while.   She also states she when she urinates, the stream of urine seems more uncontrolled than usual. She will be sitting on the toilet and the urine sprays on the back of her legs or in the sides of the toilet.  ROS:  A complete review of systems was obtained and all systems are negative except as noted in the HPI and PMH.    Pertinent History Reviewed:   Reviewed: Significant for abdominal hysterectomy and right mastectomy Medical         Past Medical History  Diagnosis Date  . HTN (hypertension)   . Obesity   . GERD (gastroesophageal reflux disease)   . Diabetes mellitus without complication   . Breast cancer 1990s    right breast                              Surgical Hx:    Past Surgical History  Procedure Laterality Date  . Mastectomy      right   . Colonoscopy  May 2003    Dr. Tamala Julian: normal  . Colonoscopy  06/22/2011    Procedure: COLONOSCOPY;  Surgeon: Danie Binder, MD;  Location: [facility];  Service: Endoscopy;  Laterality: N/A;  10:30  . Abdominal hysterectomy      [place], fibroids   Medications: Reviewed & Updated - see associated section                       Current outpatient prescriptions:  .  aspirin (ADULT ASPIRIN EC LOW STRENGTH) 81 MG EC tablet, Take 81 mg by mouth daily. , Disp: , Rfl:  .  benazepril (LOTENSIN) 40 MG tablet, Take 1 tablet (40 mg total) by mouth daily., Disp: 90 tablet,  Rfl: 1 .  calcium-vitamin D (OSCAL WITH D) 500-200 MG-UNIT per tablet, Take 1 tablet by mouth daily with breakfast., Disp: , Rfl:  .  cloNIDine (CATAPRES) 0.1 MG tablet, Take 1 tablet (0.1 mg total) by mouth at bedtime., Disp: 90 tablet, Rfl: 1 .  fluticasone (FLONASE) 50 MCG/ACT nasal spray, Place 2 sprays into both nostrils daily., Disp: 48 g, Rfl: 0 .  glucose blood (FORA V30A BLOOD GLUCOSE TEST) test strip, Once daily testing, Disp: 50 each, Rfl: 5 .  Lancets (UNILET EXCELITE II) MISC, TEST ONCE DAILY, Disp: 100 each, Rfl: 0 .  loratadine (CLARITIN) 10 MG tablet, Take 1 tablet (10 mg total) by mouth daily., Disp: 30 tablet, Rfl: 2 .  lovastatin (MEVACOR) 40 MG tablet, Take 1 tablet (40 mg total) by mouth daily., Disp: 90 tablet, Rfl: 1 .  metFORMIN (GLUCOPHAGE) 500 MG tablet, Take 1 tablet (500 mg total) by mouth daily with breakfast., Disp: 90 tablet, Rfl: 1 .  Multiple Vitamin (  MULTIVITAMIN) tablet, Take 1 tablet by mouth daily., Disp: , Rfl:  .  Omega-3 Fatty Acids (FISH OIL) 1000 MG CAPS, Take 1 capsule by mouth daily. , Disp: , Rfl:  .  pantoprazole (PROTONIX) 40 MG tablet, Take 1 tablet (40 mg total) by mouth daily., Disp: 90 tablet, Rfl: 1 .  amLODipine (NORVASC) 10 MG tablet, Take 1 tablet (10 mg total) by mouth daily. (Patient not taking: Reported on 04/27/2014), Disp: 90 tablet, Rfl: 1   Social History: Reviewed -  reports that she has never smoked. She has never used smokeless tobacco.  Objective Findings:  Vitals: Blood pressure 160/100, height 5' 9.5" (1.765 m), weight 197 lb (89.359 kg).  Physical Examination: General appearance - alert, well appearing, and in no distress, oriented to person, place, and time and normal appearing weight Mental status - alert, oriented to person, place, and time, normal mood, behavior, speech, dress, motor activity, and thought processes, affect appropriate to mood Pelvic - normal external genitalia, vulva, vagina, cervix, uterus and adnexa,   VULVA: normal appearing vulva with 4 small masses,no tenderness at comedones in skin up to 1 cm diameter on left side, one on right on gluteal tissues near the perineal body.  VAGINA: Atrophic vaginal tissues, small cystocele CERVIX: surgically absent UTERUS: surgically absent ADNEXA: normal adnexa in size, nontender and no masses,  RECTAL: reasonable support, guaiac negative SKIN: On the inner thigh bilaterally, 1 cm from the introitus, are 3 hard sebaceous cysts on the left, and 1 hard sebaceous cyst on the right. They are not expressable.    Assessment & Plan:   A: multiple vulvar comedones, stable, pt reassured 1. No abnormality of urethra or bladder support.  P:  1. Discussed possibility of I&D if pt desires, pt currently declines. Fu prn 2. No current explanation of pt' s misdirected urine flow. I personally performed the services described in this documentation, which was SCRIBED in my presence. The recorded information has been reviewed and considered accurate. It has been edited as necessary during review. Jonnie Kind, MD

## 2014-04-27 NOTE — Progress Notes (Signed)
Patient ID: Angela Simmons, female   DOB: 01/01/1946, 69 y.o.   MRN: [id number] Pt here today from Dr. Moshe Cipro for a knot on her bottom. Pt states that the area is sometimes sore. Pt states that she has had the area for quite a while.

## 2014-04-30 ENCOUNTER — Encounter: Payer: Self-pay | Admitting: Family Medicine

## 2014-06-11 ENCOUNTER — Other Ambulatory Visit: Payer: Self-pay | Admitting: Family Medicine

## 2014-06-28 DIAGNOSIS — Z23 Encounter for immunization: Secondary | ICD-10-CM | POA: Insufficient documentation

## 2014-06-28 DIAGNOSIS — E785 Hyperlipidemia, unspecified: Secondary | ICD-10-CM | POA: Insufficient documentation

## 2014-06-28 DIAGNOSIS — E1169 Type 2 diabetes mellitus with other specified complication: Secondary | ICD-10-CM | POA: Insufficient documentation

## 2014-06-28 DIAGNOSIS — N9089 Other specified noninflammatory disorders of vulva and perineum: Secondary | ICD-10-CM | POA: Insufficient documentation

## 2014-06-28 NOTE — Assessment & Plan Note (Addendum)
Unchnaged Patient re-educated about  the importance of commitment to a  minimum of 150 minutes of exercise per week.  The importance of healthy food choices with portion control discussed. Encouraged to start a food diary, count calories and to consider  joining a support group. Sample diet sheets offered. Goals set by the patient for the next several months.   Weight /BMI 04/27/2014 04/11/2014 01/02/2014  WEIGHT 197 lb 197 lb 196 lb  HEIGHT 5' 9.5" 5\' 9"  5\' 9"   BMI 28.68 kg/m2 29.08 kg/m2 28.93 kg/m2    Current exercise per week 90 minutes.

## 2014-06-28 NOTE — Assessment & Plan Note (Signed)
Controlled, no change in medication Hyperlipidemia:Low fat diet discussed and encouraged.   Lipid Panel  Lab Results  Component Value Date   CHOL 139 11/21/2013   HDL 44 11/21/2013   LDLCALC 83 11/21/2013   TRIG 60 11/21/2013   CHOLHDL 3.2 11/21/2013      Updated lab needed at/ before next visit.

## 2014-06-28 NOTE — Assessment & Plan Note (Signed)
After obtaining informed consent, the vaccine is  administered by LPN.  

## 2014-06-28 NOTE — Assessment & Plan Note (Signed)
Controlled, no change in medication DASH diet and commitment to daily physical activity for a minimum of 30 minutes discussed and encouraged, as a part of hypertension management. The importance of attaining a healthy weight is also discussed.  BP/Weight 04/27/2014 04/11/2014 01/02/2014 11/23/2013 08/29/2013 07/05/2013 04/12/4980  Systolic BP 641 583 094 076 808 811 031  Diastolic BP 594 78 78 82 78 80 80  Wt. (Lbs) 197 197 196 199.4 202 203.8 204  BMI 28.68 29.08 28.93 29.43 29.82 30.08 30.11

## 2014-06-28 NOTE — Assessment & Plan Note (Signed)
Importance of talking medication daily discussed as this is most likely cause of chest pain, also decreased caffeine intake

## 2014-06-28 NOTE — Assessment & Plan Note (Addendum)
C/o multiple lesions in the area which see,m to be enlarging, gyne to eval and treat Exam deferred as pt has significant limitation in mobility

## 2014-06-28 NOTE — Assessment & Plan Note (Signed)
Controlled, no change in medication Patient educated about the importance of limiting  Carbohydrate intake , the need to commit to daily physical activity for a minimum of 30 minutes , and to commit weight loss. The fact that changes in all these areas will reduce or eliminate all together the development of diabetes is stressed.   Diabetic Labs Latest Ref Rng 04/09/2014 11/23/2013 11/21/2013 05/16/2013 01/20/2013  HbA1c <5.7 % 6.3(H) - 6.6(H) 6.5(H) 6.4(H)  Microalbumin <2.0 mg/dL - 0.5 - - -  Micro/Creat Ratio 0.0 - 30.0 mg/g - 5.8 - - -  Chol 0 - 200 mg/dL - - 139 - 133  HDL >39 mg/dL - - 44 - 41  Calc LDL 0 - 99 mg/dL - - 83 - 82  Triglycerides <150 mg/dL - - 60 - 49  Creatinine 0.50 - 1.10 mg/dL 0.80 - 0.90 0.91 0.95   BP/Weight 04/27/2014 04/11/2014 01/02/2014 11/23/2013 08/29/2013 07/05/2013 5/46/5035  Systolic BP 465 681 275 170 017 494 496  Diastolic BP 759 78 78 82 78 80 80  Wt. (Lbs) 197 197 196 199.4 202 203.8 204  BMI 28.68 29.08 28.93 29.43 29.82 30.08 30.11   Foot/eye exam completion dates Latest Ref Rng 12/13/2013 11/23/2013  Eye Exam No Retinopathy No Retinopathy -  Foot Form Completion - - Done

## 2014-07-24 ENCOUNTER — Telehealth: Payer: Self-pay | Admitting: *Deleted

## 2014-07-24 MED ORDER — PREDNISONE 5 MG (21) PO TBPK: 5.0000 mg | ORAL_TABLET | ORAL | Status: DC

## 2014-07-24 MED ORDER — IBUPROFEN 800 MG PO TABS: 800.0000 mg | ORAL_TABLET | Freq: Three times a day (TID) | ORAL | Status: DC

## 2014-07-24 MED ORDER — TRAMADOL HCL 50 MG PO TABS: 50.0000 mg | ORAL_TABLET | Freq: Three times a day (TID) | ORAL | Status: DC | PRN

## 2014-07-24 NOTE — Telephone Encounter (Signed)
Pt called requesting to be seen today pt states she feels like she can't move her body and taking pain pills haven't helped, pt states this has been going on for a week. Please advise [phone]

## 2014-07-24 NOTE — Telephone Encounter (Signed)
Spoke with patient.  She states that she is having an increase in leg pain x 3 weeks.  Worsens at night.  Please advise.  No acute injuries.

## 2014-07-24 NOTE — Telephone Encounter (Signed)
I have sent in tramadol, pred dose pack and ibuprofen, needs ov next week if no better, I believe since bilateral pain it is from spine problems, ibuprofen , tramadol and prednisone sent in Offer appt next week, will need urgent care or ortho if worsens

## 2014-07-24 NOTE — Telephone Encounter (Signed)
Pt called requesting to be seen today pt states she feels like she can't move her body and taking pain pills haven't helped, pt states this has been going on for a week. Please advise [phone]

## 2014-07-24 NOTE — Telephone Encounter (Signed)
Patient aware and meds sent to pharmacy.  Patient given appt for 7/13

## 2014-08-01 ENCOUNTER — Ambulatory Visit (INDEPENDENT_AMBULATORY_CARE_PROVIDER_SITE_OTHER): Payer: Medicare HMO | Admitting: Family Medicine

## 2014-08-01 ENCOUNTER — Encounter: Payer: Self-pay | Admitting: Family Medicine

## 2014-08-01 VITALS — BP 120/78 | HR 85 | Resp 16 | Ht 70.0 in | Wt 195.1 lb

## 2014-08-01 DIAGNOSIS — M5440 Lumbago with sciatica, unspecified side: Secondary | ICD-10-CM

## 2014-08-01 DIAGNOSIS — E663 Overweight: Secondary | ICD-10-CM

## 2014-08-01 DIAGNOSIS — M545 Low back pain, unspecified: Secondary | ICD-10-CM

## 2014-08-01 DIAGNOSIS — E1140 Type 2 diabetes mellitus with diabetic neuropathy, unspecified: Secondary | ICD-10-CM

## 2014-08-01 DIAGNOSIS — L700 Acne vulgaris: Secondary | ICD-10-CM

## 2014-08-01 DIAGNOSIS — I10 Essential (primary) hypertension: Secondary | ICD-10-CM | POA: Diagnosis not present

## 2014-08-01 DIAGNOSIS — E785 Hyperlipidemia, unspecified: Secondary | ICD-10-CM

## 2014-08-01 DIAGNOSIS — J3089 Other allergic rhinitis: Secondary | ICD-10-CM | POA: Diagnosis not present

## 2014-08-01 DIAGNOSIS — L708 Other acne: Secondary | ICD-10-CM

## 2014-08-01 DIAGNOSIS — N3010 Interstitial cystitis (chronic) without hematuria: Secondary | ICD-10-CM

## 2014-08-01 LAB — COMPLETE METABOLIC PANEL WITH GFR
ALK PHOS: 53 U/L (ref 39–117)
ALT: 9 U/L (ref 0–35)
AST: 10 U/L (ref 0–37)
Albumin: 3.6 g/dL (ref 3.5–5.2)
BILIRUBIN TOTAL: 0.5 mg/dL (ref 0.2–1.2)
BUN: 15 mg/dL (ref 6–23)
CO2: 32 mEq/L (ref 19–32)
CREATININE: 0.85 mg/dL (ref 0.50–1.10)
Calcium: 9.4 mg/dL (ref 8.4–10.5)
Chloride: 103 mEq/L (ref 96–112)
GFR, Est African American: 81 mL/min
GFR, Est Non African American: 71 mL/min
GLUCOSE: 69 mg/dL — AB (ref 70–99)
POTASSIUM: 4.2 meq/L (ref 3.5–5.3)
SODIUM: 140 meq/L (ref 135–145)
Total Protein: 7.3 g/dL (ref 6.0–8.3)

## 2014-08-01 LAB — LIPID PANEL
Cholesterol: 135 mg/dL (ref 0–200)
HDL: 45 mg/dL — ABNORMAL LOW (ref >=46)
LDL CALC: 77 mg/dL (ref 0–99)
Total CHOL/HDL Ratio: 3 Ratio
Triglycerides: 64 mg/dL (ref <150)
VLDL: 13 mg/dL (ref 0–40)

## 2014-08-01 LAB — HEMOGLOBIN A1C
HEMOGLOBIN A1C: 6.4 % — AB (ref <5.7)
MEAN PLASMA GLUCOSE: 137 mg/dL — AB (ref <117)

## 2014-08-01 NOTE — Patient Instructions (Signed)
Annual wellness in early December with rectal, call if you need me before  Labs are excellent, no med changes  You had a flare of arthtritis back pain which is better  It is important that you exercise regularly at least 30 minutes7 times a week. If you develop chest pain, have severe difficulty breathing, or feel very tired, stop exercising immediately and seek medical attention    HBa1C, chem 7 and EGFR, cBc, TSH, microalb, non fasting   Please work on good  health habits so that your health will improve. 1. Commitment to daily physical activity for 30 to 60  minutes, if you are able to do this.  2. Commitment to wise food choices. Aim for half of your  food intake to be vegetable and fruit, one quarter starchy foods, and one quarter protein. Try to eat on a regular schedule  3 meals per day, snacking between meals should be limited to vegetables or fruits or small portions of nuts. 64 ounces of water per day is generally recommended, unless you have specific health conditions, like heart failure or kidney failure where you will need to limit fluid intake.  3. Commitment to sufficient and a  good quality of physical and mental rest daily, generally between 6 to 8 hours per day.  WITH PERSISTANCE AND PERSEVERANCE, THE IMPOSSIBLE , BECOMES THE NORM! Thanks for choosing [facility], we consider it a privelige to serve you.

## 2014-08-14 ENCOUNTER — Ambulatory Visit: Payer: Medicare HMO | Admitting: Family Medicine

## 2014-08-18 DIAGNOSIS — M5440 Lumbago with sciatica, unspecified side: Secondary | ICD-10-CM

## 2014-08-18 DIAGNOSIS — M545 Low back pain, unspecified: Secondary | ICD-10-CM | POA: Insufficient documentation

## 2014-08-18 DIAGNOSIS — E663 Overweight: Secondary | ICD-10-CM | POA: Insufficient documentation

## 2014-08-18 NOTE — Assessment & Plan Note (Signed)
Evaluated and managed by gyne

## 2014-08-18 NOTE — Assessment & Plan Note (Signed)
Good response to anti inflammatories, currently asymptomatic

## 2014-08-18 NOTE — Assessment & Plan Note (Signed)
No recent flares, stable off of any medication

## 2014-08-18 NOTE — Assessment & Plan Note (Signed)
Controlled, no change in medication DASH diet and commitment to daily physical activity for a minimum of 30 minutes discussed and encouraged, as a part of hypertension management. The importance of attaining a healthy weight is also discussed.  BP/Weight 08/01/2014 04/27/2014 04/11/2014 01/02/2014 11/23/2013 08/29/2013 9/70/2637  Systolic BP 858 850 277 412 878 676 720  Diastolic BP 78 947 78 78 82 78 80  Wt. (Lbs) 195.12 197 197 196 199.4 202 203.8  BMI 28 28.68 29.08 28.93 29.43 29.82 30.08

## 2014-08-18 NOTE — Assessment & Plan Note (Signed)
improved. Patient re-educated about  the importance of commitment to a  minimum of 150 minutes of exercise per week.  The importance of healthy food choices with portion control discussed. Encouraged to start a food diary, count calories and to consider  joining a support group. Sample diet sheets offered. Goals set by the patient for the next several months.   Weight /BMI 08/01/2014 04/27/2014 04/11/2014  WEIGHT 195 lb 1.9 oz 197 lb 197 lb  HEIGHT 5\' 10"  5' 9.5" 5\' 9"   BMI 28 kg/m2 28.68 kg/m2 29.08 kg/m2    Current exercise per week 120 minutes.

## 2014-08-18 NOTE — Assessment & Plan Note (Signed)
Controlled , no change in management 

## 2014-08-18 NOTE — Assessment & Plan Note (Signed)
Hyperlipidemia:Low fat diet discussed and encouraged.   Lipid Panel  Lab Results  Component Value Date   CHOL 135 07/31/2014   HDL 45* 07/31/2014   LDLCALC 77 07/31/2014   TRIG 64 07/31/2014   CHOLHDL 3.0 07/31/2014   Controlled, no change in medication

## 2014-08-18 NOTE — Progress Notes (Signed)
Angela Simmons     MRN: [id number]      DOB: 1945/09/01   HPI Angela Simmons is here for follow up and re-evaluation of chronic medical conditions, medication management and review of any available recent lab and radiology data.  Preventive health is updated, specifically  Cancer screening and Immunization.   Questions or concerns regarding consultations or procedures which the PT has had in the interim are  addressed. The PT denies any adverse reactions to current medications since the last visit.  Back pain which she was recently treated for has now completely resolved and she is happy about this   ROS Denies recent fever or chills. Denies sinus pressure, nasal congestion, ear pain or sore throat. Denies chest congestion, productive cough or wheezing. Denies chest pains, palpitations and leg swelling Denies abdominal pain, nausea, vomiting,diarrhea or constipation.   Denies dysuria, frequency, hesitancy or incontinence. Denies joint pain, swelling and limitation in mobility. Denies headaches, seizures, numbness, or tingling. Denies depression, anxiety or insomnia. Denies skin break down or rash.   PE  BP 120/78 mmHg  Pulse 85  Resp 16  Ht 5\' 10"  (1.778 m)  Wt 195 lb 1.9 oz (88.506 kg)  BMI 28.00 kg/m2  SpO2 98%  Patient alert and oriented and in no cardiopulmonary distress.  HEENT: No facial asymmetry, EOMI,   oropharynx pink and moist.  Neck supple no JVD, no mass.  Chest: Clear to auscultation bilaterally.  CVS: S1, S2 no murmurs, no S3.Regular rate.  ABD: Soft non tender.   Ext: No edema  MS: Adequate ROM spine, shoulders, hips and knees.  Skin: Intact, no ulcerations or rash noted.  Psych: Good eye contact, normal affect. Memory intact not anxious or depressed appearing.  CNS: CN 2-12 intact, power,  normal throughout.no focal deficits noted.   Assessment & Plan   Essential hypertension, benign Controlled, no change in medication DASH diet and  commitment to daily physical activity for a minimum of 30 minutes discussed and encouraged, as a part of hypertension management. The importance of attaining a healthy weight is also discussed.  BP/Weight 08/01/2014 04/27/2014 04/11/2014 01/02/2014 11/23/2013 08/29/2013 07/27/6281  Systolic BP 662 947 654 650 354 656 812  Diastolic BP 78 751 78 78 82 78 80  Wt. (Lbs) 195.12 197 197 196 199.4 202 203.8  BMI 28 28.68 29.08 28.93 29.43 29.82 30.08        Allergic rhinitis Controlled, no change in management   Type 2 diabetes, controlled, with neuropathy Controlled, no change in medication Angela Simmons is reminded of the importance of commitment to daily physical activity for 30 minutes or more, as able and the need to limit carbohydrate intake to 30 to 60 grams per meal to help with blood sugar control.   The need to take medication as prescribed, test blood sugar as directed, and to call between visits if there is a concern that blood sugar is uncontrolled is also discussed.   Angela Simmons is reminded of the importance of daily foot exam, annual eye examination, and good blood sugar, blood pressure and cholesterol control.  Diabetic Labs Latest Ref Rng 07/31/2014 04/09/2014 11/23/2013 11/21/2013 05/16/2013  HbA1c <5.7 % 6.4(H) 6.3(H) - 6.6(H) 6.5(H)  Microalbumin <2.0 mg/dL - - 0.5 - -  Micro/Creat Ratio 0.0 - 30.0 mg/g - - 5.8 - -  Chol 0 - 200 mg/dL 135 - - 139 -  HDL >=46 mg/dL 45(L) - - 44 -  Calc LDL 0 - 99 mg/dL  77 - - 83 -  Triglycerides <150 mg/dL 64 - - 60 -  Creatinine 0.50 - 1.10 mg/dL 0.85 0.80 - 0.90 0.91   BP/Weight 08/01/2014 04/27/2014 04/11/2014 01/02/2014 11/23/2013 08/29/2013 02/15/1186  Systolic BP 677 373 668 159 470 761 518  Diastolic BP 78 343 78 78 82 78 80  Wt. (Lbs) 195.12 197 197 196 199.4 202 203.8  BMI 28 28.68 29.08 28.93 29.43 29.82 30.08   Foot/eye exam completion dates Latest Ref Rng 12/13/2013 11/23/2013  Eye Exam No Retinopathy No Retinopathy -  Foot Form  Completion - - Done         Hyperlipidemia with target LDL less than 100 Hyperlipidemia:Low fat diet discussed and encouraged.   Lipid Panel  Lab Results  Component Value Date   CHOL 135 07/31/2014   HDL 45* 07/31/2014   LDLCALC 77 07/31/2014   TRIG 64 07/31/2014   CHOLHDL 3.0 07/31/2014   Controlled, no change in medication      Closed comedones , vulva,, multiple Evaluated and managed by gyne  INTERSTITIAL CYSTITIS No recent flares, stable off of any medication  Overweight (BMI 25.0-29.9) improved. Patient re-educated about  the importance of commitment to a  minimum of 150 minutes of exercise per week.  The importance of healthy food choices with portion control discussed. Encouraged to start a food diary, count calories and to consider  joining a support group. Sample diet sheets offered. Goals set by the patient for the next several months.   Weight /BMI 08/01/2014 04/27/2014 04/11/2014  WEIGHT 195 lb 1.9 oz 197 lb 197 lb  HEIGHT 5\' 10"  5' 9.5" 5\' 9"   BMI 28 kg/m2 28.68 kg/m2 29.08 kg/m2    Current exercise per week 120 minutes.   Low back pain with radiation Good response to anti inflammatories, currently asymptomatic

## 2014-08-18 NOTE — Assessment & Plan Note (Signed)
Controlled, no change in medication Angela Simmons is reminded of the importance of commitment to daily physical activity for 30 minutes or more, as able and the need to limit carbohydrate intake to 30 to 60 grams per meal to help with blood sugar control.   The need to take medication as prescribed, test blood sugar as directed, and to call between visits if there is a concern that blood sugar is uncontrolled is also discussed.   Angela Simmons is reminded of the importance of daily foot exam, annual eye examination, and good blood sugar, blood pressure and cholesterol control.  Diabetic Labs Latest Ref Rng 07/31/2014 04/09/2014 11/23/2013 11/21/2013 05/16/2013  HbA1c <5.7 % 6.4(H) 6.3(H) - 6.6(H) 6.5(H)  Microalbumin <2.0 mg/dL - - 0.5 - -  Micro/Creat Ratio 0.0 - 30.0 mg/g - - 5.8 - -  Chol 0 - 200 mg/dL 135 - - 139 -  HDL >=46 mg/dL 45(L) - - 44 -  Calc LDL 0 - 99 mg/dL 77 - - 83 -  Triglycerides <150 mg/dL 64 - - 60 -  Creatinine 0.50 - 1.10 mg/dL 0.85 0.80 - 0.90 0.91   BP/Weight 08/01/2014 04/27/2014 04/11/2014 01/02/2014 11/23/2013 08/29/2013 2/35/3614  Systolic BP 431 540 086 761 950 932 671  Diastolic BP 78 245 78 78 82 78 80  Wt. (Lbs) 195.12 197 197 196 199.4 202 203.8  BMI 28 28.68 29.08 28.93 29.43 29.82 30.08   Foot/eye exam completion dates Latest Ref Rng 12/13/2013 11/23/2013  Eye Exam No Retinopathy No Retinopathy -  Foot Form Completion - - Done

## 2014-08-22 ENCOUNTER — Telehealth: Payer: Self-pay | Admitting: *Deleted

## 2014-08-22 NOTE — Telephone Encounter (Signed)
Pt called stating she is getting back to like she was before stiff and sore, pt said she needs a stronger medicine. Please advise

## 2014-08-22 NOTE — Telephone Encounter (Signed)
States she is aching all over again. Legs, hands, shoulders and her back. Tramadol not helping the pain. Wants to know if there is anything stronger?

## 2014-08-27 MED ORDER — MELOXICAM 15 MG PO TABS: 15.0000 mg | ORAL_TABLET | Freq: Every day | ORAL | Status: DC

## 2014-08-27 MED ORDER — PREDNISONE 5 MG PO TABS: 5.0000 mg | ORAL_TABLET | Freq: Two times a day (BID) | ORAL | Status: DC

## 2014-08-27 NOTE — Telephone Encounter (Signed)
Patient aware.  Meds sent to pharmacy.   Tramadol discontinued.

## 2014-08-27 NOTE — Telephone Encounter (Signed)
For generalized aches recommend pred 5 mg twice daily for 5 days only, and meloxicam 15 mg obne daily for 2 weeks, pls send in both if persists I recommend rheumatology eval

## 2014-08-27 NOTE — Addendum Note (Signed)
Addended by: Denman George B on: 08/27/2014 03:28 PM   Modules accepted: Orders, Medications

## 2014-09-14 ENCOUNTER — Telehealth: Payer: Self-pay

## 2014-09-14 DIAGNOSIS — G8929 Other chronic pain: Secondary | ICD-10-CM

## 2014-09-14 DIAGNOSIS — R52 Pain, unspecified: Principal | ICD-10-CM

## 2014-09-14 DIAGNOSIS — M130 Polyarthritis, unspecified: Secondary | ICD-10-CM

## 2014-09-14 MED ORDER — PREDNISONE 5 MG (21) PO TBPK: 5.0000 mg | ORAL_TABLET | ORAL | Status: DC

## 2014-09-14 NOTE — Telephone Encounter (Signed)
States she ran out of the meds you gave her 4 days ago and now the pain is coming back again. Arms and shoulder ache and are weak and her hands are throbbing and her legs and she can hardly get around. Michela Pitcher you mentioned a rheumatology referral if it persisted and she also needs more meds because she has a lot to be doing this weekend. I will refer to [place] if ok. Please advise

## 2014-09-14 NOTE — Telephone Encounter (Signed)
Patient referred and she is aware that med has been sent

## 2014-09-14 NOTE — Telephone Encounter (Signed)
pred sent in and I have referred, pls help to name Doc unsure where Doc Dbut there is a medical office in [place] with rheumatology, Dr Charlestine Night  Is also an n option just need to ensure takes  the ins

## 2014-09-20 ENCOUNTER — Other Ambulatory Visit: Payer: Self-pay | Admitting: Family Medicine

## 2014-09-20 ENCOUNTER — Telehealth: Payer: Self-pay | Admitting: Family Medicine

## 2014-09-20 DIAGNOSIS — Z1231 Encounter for screening mammogram for malignant neoplasm of breast: Secondary | ICD-10-CM

## 2014-09-20 DIAGNOSIS — N63 Unspecified lump in unspecified breast: Secondary | ICD-10-CM

## 2014-09-20 NOTE — Telephone Encounter (Signed)
Patient aware and [place] and mammogram will be scheduled.

## 2014-09-20 NOTE — Telephone Encounter (Signed)
Patient is requesting a chest xray, please advise?

## 2014-09-20 NOTE — Addendum Note (Signed)
Addended by: Denman George B on: 09/20/2014 03:15 PM   Modules accepted: Orders

## 2014-09-20 NOTE — Telephone Encounter (Signed)
Spoke with patient and she states that she felt what she thinks may be a lump on the area where her right breast was.  Is unsure if it is scar tissue due to her having a mastectomy.  Would like to know if she can have a mammogram of both sides or would she need an [place].  Please advise.

## 2014-09-20 NOTE — Telephone Encounter (Signed)
Mammogram was due 8/31 pls order if not done For right breast ??? Lump, order [place] of lump right chest please

## 2014-09-25 ENCOUNTER — Telehealth: Payer: Self-pay | Admitting: *Deleted

## 2014-09-25 ENCOUNTER — Other Ambulatory Visit: Payer: Self-pay | Admitting: Family Medicine

## 2014-09-25 DIAGNOSIS — N63 Unspecified lump in unspecified breast: Secondary | ICD-10-CM

## 2014-09-25 DIAGNOSIS — Z1231 Encounter for screening mammogram for malignant neoplasm of breast: Secondary | ICD-10-CM

## 2014-09-25 NOTE — Telephone Encounter (Addendum)
A daughter called for patient and was very rude and hateful and had patient on the line stating we are not professional and Dr. Griffin Dakin office is about to go down, Daughter stated that we never return calls about appts and refills I made her aware that only one person is at the front and if you leave a message we do return calls, she stated that she is going to start recording this because we are not prefessional, Daughter stated we have not contacted pt for her mammogram I made her aware that I called [facility] today and radiology confirmed that the patient schedule her appt already for 09/28/14. Daughter stated they have not heard anything on the other referral I made her aware that I faxed the Referral to Dr. Estanislado Pandy and I gave her the Phone number to that practice, I also made her aware that Dr. Arlean Hopping office told me that they will call patient with the appt. I apologized to the daughter and patient for how they felt about our office and the daughter was very rude on the phone during this whole conversation, I asked the daughter if she wanted to speak with Carmela Hurt our office manager and she asked if she had an email I made her aware that she does not and daughter stated ya'll aint even up to date with technology ya'll still doing sticky notes and paper I made her aware that we are on [employer] and we do have a [place] email that is secured and it is only for within [facility]. Daughter stated she is going to call Dr. Arlean Hopping office and she may be calling Remo Lipps back. I made Remo Lipps aware.

## 2014-09-25 NOTE — Telephone Encounter (Signed)
Noted and will need to address this with office manager. Since family member is so ENTIRELY dissatisfied with the quality of care provided, I think it may be best that patient seek care at another facility, will see how management feels

## 2014-09-28 ENCOUNTER — Other Ambulatory Visit: Payer: Self-pay | Admitting: Family Medicine

## 2014-09-28 ENCOUNTER — Ambulatory Visit (HOSPITAL_COMMUNITY): Payer: Medicare HMO

## 2014-09-28 DIAGNOSIS — N63 Unspecified lump in unspecified breast: Secondary | ICD-10-CM

## 2014-10-02 ENCOUNTER — Telehealth: Payer: Self-pay | Admitting: Family Medicine

## 2014-10-02 ENCOUNTER — Ambulatory Visit (HOSPITAL_COMMUNITY)
Admission: RE | Admit: 2014-10-02 | Discharge: 2014-10-02 | Disposition: A | Discharge status: Home / Self Care | Payer: Commercial Managed Care - HMO | Source: Ambulatory Visit | Attending: Family Medicine | Admitting: Family Medicine

## 2014-10-02 ENCOUNTER — Other Ambulatory Visit: Payer: Self-pay | Admitting: Family Medicine

## 2014-10-02 DIAGNOSIS — N63 Unspecified lump in unspecified breast: Secondary | ICD-10-CM

## 2014-10-02 DIAGNOSIS — Z9011 Acquired absence of right breast and nipple: Secondary | ICD-10-CM | POA: Insufficient documentation

## 2014-10-02 DIAGNOSIS — Z853 Personal history of malignant neoplasm of breast: Secondary | ICD-10-CM | POA: Insufficient documentation

## 2014-10-02 DIAGNOSIS — L723 Sebaceous cyst: Secondary | ICD-10-CM | POA: Insufficient documentation

## 2014-10-02 DIAGNOSIS — L720 Epidermal cyst: Secondary | ICD-10-CM | POA: Diagnosis not present

## 2014-10-02 NOTE — Telephone Encounter (Signed)
Explain Doc is reviewing paperwork and hopefully she will let us know in the next 2 to 3 days, also make pt aware of the option we  Have on hold currently in caseDr Deveshwar opts not to see sched appt pls

## 2014-10-02 NOTE — Telephone Encounter (Signed)
Pls contact pt , and see if willing to see another rheumatologist as wer are having difficullty contacting the office  If she agrees [facility] has rheumatologists there,I will  refer to that group or Dr Charlestine Night , whichever appt is sooner . Let me know pt response an d enter referral I will sign please

## 2014-10-02 NOTE — Telephone Encounter (Signed)
Spoke with Angela Simmons at [facility] office and they didn't see a need for a rheumatology referral based on the referral info so I explained the pts complaints and the phone conversations we had with her and faxed over all the recent labs, messages and OV for the Dr to review. She will be back in touch as soon as the Dr makes a decision to see her. DO you want to go ahead with another referral to [facility] or wait to hear back from this one?

## 2014-10-03 ENCOUNTER — Telehealth: Payer: Self-pay | Admitting: *Deleted

## 2014-10-03 NOTE — Telephone Encounter (Signed)
Patient already aware per yesterdays conversation when I called her with the mammo results but per Remo Lipps, she is going to call the daughter back Thurs with more information and no not talk with the daughter if she calls

## 2014-10-03 NOTE — Telephone Encounter (Signed)
Angela Simmons called Angela Simmons stating someone had called her from the office. I called Angela Simmons back and made her aware that no one has called today from our office, I told Angela Simmons it may have been Angela Simmons calling her yesterday and Angela Simmons will be back tomorrow, Angela Simmons stated that Angela Simmons is supposed to call her today, I made her aware that I spoke with Angela Simmons and Angela Simmons stated if she hears anything back about her referral to Angela Simmons she will call her today, if not she will hear an update from Angela Simmons tomorrow. Angela Simmons Verbally stated okay, and she then requested a refill on her prednisone to [facility], I made her aware I will let Angela Simmons know about her refill.

## 2014-10-04 ENCOUNTER — Telehealth: Payer: Self-pay | Admitting: Family Medicine

## 2014-10-04 ENCOUNTER — Other Ambulatory Visit: Payer: Self-pay

## 2014-10-04 MED ORDER — PREDNISONE 5 MG PO TABS: 5.0000 mg | ORAL_TABLET | Freq: Two times a day (BID) | ORAL | Status: DC

## 2014-10-04 NOTE — Telephone Encounter (Signed)
Is it ok to refill prednisone?

## 2014-10-04 NOTE — Telephone Encounter (Signed)
Med refilled and patient to be notified.

## 2014-10-04 NOTE — Telephone Encounter (Signed)
Noted  

## 2014-10-04 NOTE — Telephone Encounter (Signed)
Refill x 1 please 

## 2014-10-04 NOTE — Telephone Encounter (Signed)
Tried to reach patient at home # but phone rang and rang - no answer.  Will try cell number and/or speak with daughter/[name] Richardson Dopp today.

## 2014-10-15 ENCOUNTER — Ambulatory Visit
Admission: RE | Admit: 2014-10-15 | Discharge: 2014-10-15 | Disposition: A | Discharge status: Home / Self Care | Payer: Medicare HMO | Source: Ambulatory Visit | Attending: Chiropractic Medicine | Admitting: Chiropractic Medicine

## 2014-10-15 ENCOUNTER — Other Ambulatory Visit: Payer: Self-pay | Admitting: Chiropractic Medicine

## 2014-10-15 DIAGNOSIS — M542 Cervicalgia: Secondary | ICD-10-CM

## 2014-10-15 DIAGNOSIS — M545 Low back pain: Secondary | ICD-10-CM

## 2014-11-06 LAB — CBC AND DIFFERENTIAL
HCT: 34 % — AB (ref 36–46)
Hemoglobin: 10.9 g/dL — AB (ref 12.0–16.0)
PLATELETS: 437 10*3/uL — AB (ref 150–399)
WBC: 6.7 10*3/mL

## 2014-11-06 LAB — HEPATIC FUNCTION PANEL
ALT: 9 U/L (ref 7–35)
AST: 10 U/L — AB (ref 13–35)
Bilirubin, Total: 0.3 mg/dL

## 2014-11-06 LAB — BASIC METABOLIC PANEL
CREATININE: 0.7 mg/dL (ref 0.5–1.1)
Glucose: 81 mg/dL
Potassium: 4.5 mmol/L (ref 3.4–5.3)
Sodium: 140 mmol/L (ref 137–147)

## 2014-11-22 ENCOUNTER — Ambulatory Visit (INDEPENDENT_AMBULATORY_CARE_PROVIDER_SITE_OTHER): Payer: Commercial Managed Care - HMO | Admitting: Internal Medicine

## 2014-11-22 ENCOUNTER — Encounter: Payer: Self-pay | Admitting: Internal Medicine

## 2014-11-22 VITALS — BP 122/70 | HR 84 | Temp 97.6°F | Resp 16 | Ht 69.0 in | Wt 188.0 lb

## 2014-11-22 DIAGNOSIS — K219 Gastro-esophageal reflux disease without esophagitis: Secondary | ICD-10-CM | POA: Diagnosis not present

## 2014-11-22 DIAGNOSIS — Z23 Encounter for immunization: Secondary | ICD-10-CM

## 2014-11-22 DIAGNOSIS — E785 Hyperlipidemia, unspecified: Secondary | ICD-10-CM

## 2014-11-22 DIAGNOSIS — I10 Essential (primary) hypertension: Secondary | ICD-10-CM

## 2014-11-22 DIAGNOSIS — M2550 Pain in unspecified joint: Secondary | ICD-10-CM

## 2014-11-22 DIAGNOSIS — E1140 Type 2 diabetes mellitus with diabetic neuropathy, unspecified: Secondary | ICD-10-CM | POA: Diagnosis not present

## 2014-11-22 MED ORDER — METFORMIN HCL 500 MG PO TABS: 500.0000 mg | ORAL_TABLET | Freq: Every day | ORAL | Status: DC

## 2014-11-22 MED ORDER — RANITIDINE HCL 150 MG PO TABS: 150.0000 mg | ORAL_TABLET | Freq: Every day | ORAL | Status: DC

## 2014-11-22 NOTE — Assessment & Plan Note (Signed)
Last lipid panel well controlled Continue current dose of medication Recheck lipid panel in 6 months

## 2014-11-22 NOTE — Assessment & Plan Note (Signed)
Blood pressure at goal and well controlled today Continue current medications at current doses Follow-up in 6 months

## 2014-11-22 NOTE — Patient Instructions (Addendum)
We have reviewed your prior records including labs and tests today.  All other Health Maintenance issues reviewed.   All recommended immunizations and age-appropriate screenings are up-to-date.  Flu vaccine administered today.   Medications reviewed and updated.  Changes include adding zantac to your regimen to help control your heartburn.  Your prescription(s) have been submitted to your pharmacy. Please take as directed and contact our office if you believe you are having problem(s) with the medication(s).  Please schedule followup in 6 months        Gastroesophageal Reflux Disease, Adult Normally, food travels down the esophagus and stays in the stomach to be digested. However, when a person has gastroesophageal reflux disease (GERD), food and stomach acid move back up into the esophagus. When this happens, the esophagus becomes sore and inflamed. Over time, GERD can create small holes (ulcers) in the lining of the esophagus.  CAUSES This condition is caused by a problem with the muscle between the esophagus and the stomach (lower esophageal sphincter, or LES). Normally, the LES muscle closes after food passes through the esophagus to the stomach. When the LES is weakened or abnormal, it does not close properly, and that allows food and stomach acid to go back up into the esophagus. The LES can be weakened by certain dietary substances, medicines, and medical conditions, including:  Tobacco use.  Pregnancy.  Having a hiatal hernia.  Heavy alcohol use.  Certain foods and beverages, such as coffee, chocolate, onions, and peppermint. RISK FACTORS This condition is more likely to develop in:  People who have an increased body weight.  People who have connective tissue disorders.  People who use NSAID medicines. SYMPTOMS Symptoms of this condition include:  Heartburn.  Difficult or painful swallowing.  The feeling of having a lump in the throat.  Abitter taste in the  mouth.  Bad breath.  Having a large amount of saliva.  Having an upset or bloated stomach.  Belching.  Chest pain.  Shortness of breath or wheezing.  Ongoing (chronic) cough or a night-time cough.  Wearing away of tooth enamel.  Weight loss. Different conditions can cause chest pain. Make sure to see your health care provider if you experience chest pain. DIAGNOSIS Your health care provider will take a medical history and perform a physical exam. To determine if you have mild or severe GERD, your health care provider may also monitor how you respond to treatment. You may also have other tests, including:  An endoscopy toexamine your stomach and esophagus with a small camera.  A test thatmeasures the acidity level in your esophagus.  A test thatmeasures how much pressure is on your esophagus.  A barium swallow or modified barium swallow to show the shape, size, and functioning of your esophagus. TREATMENT The goal of treatment is to help relieve your symptoms and to prevent complications. Treatment for this condition may vary depending on how severe your symptoms are. Your health care provider may recommend:  Changes to your diet.  Medicine.  Surgery. HOME CARE INSTRUCTIONS Diet  Follow a diet as recommended by your health care provider. This may involve avoiding foods and drinks such as:  Coffee and tea (with or without caffeine).  Drinks that containalcohol.  Energy drinks and sports drinks.  Carbonated drinks or sodas.  Chocolate and cocoa.  Peppermint and mint flavorings.  Garlic and onions.  Horseradish.  Spicy and acidic foods, including peppers, chili powder, curry powder, vinegar, hot sauces, and barbecue sauce.  Citrus fruit  juices and citrus fruits, such as oranges, lemons, and limes.  Tomato-based foods, such as red sauce, chili, salsa, and pizza with red sauce.  Fried and fatty foods, such as donuts, french fries, potato chips, and  high-fat dressings.  High-fat meats, such as hot dogs and fatty cuts of red and white meats, such as rib eye steak, sausage, ham, and bacon.  High-fat dairy items, such as whole milk, butter, and cream cheese.  Eat small, frequent meals instead of large meals.  Avoid drinking large amounts of liquid with your meals.  Avoid eating meals during the 2-3 hours before bedtime.  Avoid lying down right after you eat.  Do not exercise right after you eat. General Instructions  Pay attention to any changes in your symptoms.  Take over-the-counter and prescription medicines only as told by your health care provider. Do not take aspirin, ibuprofen, or other NSAIDs unless your health care provider told you to do so.  Do not use any tobacco products, including cigarettes, chewing tobacco, and e-cigarettes. If you need help quitting, ask your health care provider.  Wear loose-fitting clothing. Do not wear anything tight around your waist that causes pressure on your abdomen.  Raise (elevate) the head of your bed 6 inches (15cm).  Try to reduce your stress, such as with yoga or meditation. If you need help reducing stress, ask your health care provider.  If you are overweight, reduce your weight to an amount that is healthy for you. Ask your health care provider for guidance about a safe weight loss goal.  Keep all follow-up visits as told by your health care provider. This is important. SEEK MEDICAL CARE IF:  You have new symptoms.  You have unexplained weight loss.  You have difficulty swallowing, or it hurts to swallow.  You have wheezing or a persistent cough.  Your symptoms do not improve with treatment.  You have a hoarse voice. SEEK IMMEDIATE MEDICAL CARE IF:  You have pain in your arms, neck, jaw, teeth, or back.  You feel sweaty, dizzy, or light-headed.  You have chest pain or shortness of breath.  You vomit and your vomit looks like blood or coffee grounds.  You  faint.  Your stool is bloody or black.  You cannot swallow, drink, or eat.   This information is not intended to replace advice given to you by your health care provider. Make sure you discuss any questions you have with your health care provider.   Document Released: 10/15/2004 Document Revised: 09/26/2014 Document Reviewed: 05/02/2014 Elsevier Interactive Patient Education [employer].
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
## 2014-11-22 NOTE — Assessment & Plan Note (Signed)
Sugars have been well controlled at home and according to last A1c in July Continue metformin She is compliant with a diabetic diet and up-to-date with diabetic health maintenance Follow-up in 6 months and we will recheck her A1c done

## 2014-11-22 NOTE — Progress Notes (Signed)
Pre visit review using our clinic review tool, if applicable. No additional management support is needed unless otherwise documented below in the visit note. 

## 2014-11-22 NOTE — Assessment & Plan Note (Signed)
Not controlled She is not compliant with her diet and is also on prednisone and meloxicam, all of which are likely contributing Reviewed GERD diet, benefits of weight loss-handout given Continue pantoprazole daily Start ranitidine 150 mg at night

## 2014-11-22 NOTE — Assessment & Plan Note (Signed)
Approximately 4 months of joint pain, joint swelling and muscle pain responsive to steroids Likely autoimmune in nature Has seen rheumatology-workup pending She will follow up with rheumatology

## 2014-11-27 NOTE — Telephone Encounter (Signed)
Patient D/C based on daughter's unacceptable behavior and comments to staff.

## 2014-11-27 NOTE — Telephone Encounter (Signed)
Documented in chart that patient confirmed refill request handled.

## 2014-11-29 ENCOUNTER — Encounter: Payer: Self-pay | Admitting: Internal Medicine

## 2014-12-02 ENCOUNTER — Encounter: Payer: Self-pay | Admitting: Internal Medicine

## 2014-12-02 DIAGNOSIS — R768 Other specified abnormal immunological findings in serum: Secondary | ICD-10-CM | POA: Insufficient documentation

## 2015-01-07 ENCOUNTER — Encounter: Payer: Medicare HMO | Admitting: Family Medicine

## 2015-01-28 ENCOUNTER — Other Ambulatory Visit: Payer: Self-pay | Admitting: Family Medicine

## 2015-01-28 ENCOUNTER — Ambulatory Visit: Payer: Commercial Managed Care - HMO | Admitting: Internal Medicine

## 2015-01-29 ENCOUNTER — Telehealth: Payer: Self-pay | Admitting: *Deleted

## 2015-01-29 ENCOUNTER — Other Ambulatory Visit: Payer: Self-pay | Admitting: *Deleted

## 2015-01-29 MED ORDER — AMLODIPINE BESYLATE 10 MG PO TABS: 10.0000 mg | ORAL_TABLET | Freq: Every day | ORAL | Status: DC

## 2015-01-29 MED ORDER — BENAZEPRIL HCL 40 MG PO TABS: 40.0000 mg | ORAL_TABLET | Freq: Every day | ORAL | Status: DC

## 2015-01-29 MED ORDER — PANTOPRAZOLE SODIUM 40 MG PO TBEC: 40.0000 mg | DELAYED_RELEASE_TABLET | Freq: Every day | ORAL | Status: DC

## 2015-01-29 NOTE — Telephone Encounter (Signed)
Received call pt states she is needing refill on her BP medication. Verified which medication inform will send Benazepril & amlodipine to [facility]...Johny Chess

## 2015-02-04 DIAGNOSIS — M170 Bilateral primary osteoarthritis of knee: Secondary | ICD-10-CM | POA: Diagnosis not present

## 2015-02-04 DIAGNOSIS — Z09 Encounter for follow-up examination after completed treatment for conditions other than malignant neoplasm: Secondary | ICD-10-CM | POA: Diagnosis not present

## 2015-02-04 DIAGNOSIS — M3500 Sicca syndrome, unspecified: Secondary | ICD-10-CM | POA: Diagnosis not present

## 2015-02-04 DIAGNOSIS — M3219 Other organ or system involvement in systemic lupus erythematosus: Secondary | ICD-10-CM | POA: Diagnosis not present

## 2015-02-05 DIAGNOSIS — H25013 Cortical age-related cataract, bilateral: Secondary | ICD-10-CM | POA: Diagnosis not present

## 2015-02-05 DIAGNOSIS — M0609 Rheumatoid arthritis without rheumatoid factor, multiple sites: Secondary | ICD-10-CM | POA: Diagnosis not present

## 2015-02-05 DIAGNOSIS — E119 Type 2 diabetes mellitus without complications: Secondary | ICD-10-CM | POA: Diagnosis not present

## 2015-02-06 ENCOUNTER — Encounter: Payer: Self-pay | Admitting: Internal Medicine

## 2015-02-06 ENCOUNTER — Encounter: Payer: Medicare HMO | Admitting: Family Medicine

## 2015-02-06 ENCOUNTER — Ambulatory Visit (INDEPENDENT_AMBULATORY_CARE_PROVIDER_SITE_OTHER): Payer: PPO | Admitting: Internal Medicine

## 2015-02-06 ENCOUNTER — Other Ambulatory Visit (INDEPENDENT_AMBULATORY_CARE_PROVIDER_SITE_OTHER): Payer: PPO

## 2015-02-06 VITALS — BP 126/74 | HR 88 | Temp 98.4°F | Resp 16 | Wt 194.0 lb

## 2015-02-06 DIAGNOSIS — E1140 Type 2 diabetes mellitus with diabetic neuropathy, unspecified: Secondary | ICD-10-CM

## 2015-02-06 DIAGNOSIS — E785 Hyperlipidemia, unspecified: Secondary | ICD-10-CM | POA: Diagnosis not present

## 2015-02-06 DIAGNOSIS — I10 Essential (primary) hypertension: Secondary | ICD-10-CM | POA: Diagnosis not present

## 2015-02-06 DIAGNOSIS — Z139 Encounter for screening, unspecified: Secondary | ICD-10-CM

## 2015-02-06 DIAGNOSIS — R0789 Other chest pain: Secondary | ICD-10-CM

## 2015-02-06 DIAGNOSIS — K219 Gastro-esophageal reflux disease without esophagitis: Secondary | ICD-10-CM

## 2015-02-06 LAB — COMPREHENSIVE METABOLIC PANEL WITH GFR
ALT: 10 U/L (ref 0–35)
AST: 11 U/L (ref 0–37)
Albumin: 3.8 g/dL (ref 3.5–5.2)
Alkaline Phosphatase: 51 U/L (ref 39–117)
BUN: 12 mg/dL (ref 6–23)
CO2: 28 meq/L (ref 19–32)
Calcium: 9.3 mg/dL (ref 8.4–10.5)
Chloride: 106 meq/L (ref 96–112)
Creatinine, Ser: 1.01 mg/dL (ref 0.40–1.20)
GFR: 69.95 mL/min
Glucose, Bld: 100 mg/dL — ABNORMAL HIGH (ref 70–99)
Potassium: 4.1 meq/L (ref 3.5–5.1)
Sodium: 142 meq/L (ref 135–145)
Total Bilirubin: 0.4 mg/dL (ref 0.2–1.2)
Total Protein: 7.4 g/dL (ref 6.0–8.3)

## 2015-02-06 LAB — HEMOGLOBIN A1C: Hgb A1c MFr Bld: 6.7 % — ABNORMAL HIGH (ref 4.6–6.5)

## 2015-02-06 LAB — LIPID PANEL
Cholesterol: 137 mg/dL (ref 0–200)
HDL: 52.5 mg/dL
LDL Cholesterol: 75 mg/dL (ref 0–99)
NonHDL: 84.36
Total CHOL/HDL Ratio: 3
Triglycerides: 47 mg/dL (ref 0.0–149.0)
VLDL: 9.4 mg/dL (ref 0.0–40.0)

## 2015-02-06 NOTE — Patient Instructions (Signed)
  We have reviewed your prior records including labs and tests today.  Test(s) ordered today. Your results will be released to [place] (or called to you) after review, usually within 72hours after test completion. If any changes need to be made, you will be notified at that same time.  All other Health Maintenance issues reviewed.   All recommended immunizations and age-appropriate screenings are up-to-date.  No immunizations administered today.   Medications reviewed and updated.  No changes recommended at this time.  A stress test for ordered and you will hear from our office to schedule this.    Please schedule followup in 6 months

## 2015-02-06 NOTE — Progress Notes (Signed)
Pre visit review using our clinic review tool, if applicable. No additional management support is needed unless otherwise documented below in the visit note. 

## 2015-02-06 NOTE — Assessment & Plan Note (Signed)
Sugars have been well controlled Monitor for hypoglycemia Continue daily metformin Continue regular exercise and weight loss efforts

## 2015-02-06 NOTE — Assessment & Plan Note (Signed)
Blood pressure well controlled Continue current medication 

## 2015-02-06 NOTE — Assessment & Plan Note (Signed)
Well-controlled Continue daily medication

## 2015-02-06 NOTE — Assessment & Plan Note (Signed)
She has been experiencing some chest pain that sounds somewhat atypical. Chest and shortness of breath and palpitations at times EKG last year was within normal limits when she was complaining of chest pain Will go ahead and order a stress test

## 2015-02-06 NOTE — Assessment & Plan Note (Addendum)
On mevacor, lipid panel controlled Continue same
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
## 2015-02-07 LAB — HEPATITIS C ANTIBODY: HCV Ab: NEGATIVE

## 2015-02-11 ENCOUNTER — Telehealth: Payer: Self-pay

## 2015-02-11 NOTE — Telephone Encounter (Signed)
-----   Message from Binnie Rail, MD sent at 02/10/2015  1:55 PM EST ----- Hep C test is neg.  Cholesterol well controlled.  Sugars controlled - a1c is 6.7%.  Kidney and liver tests are normal.

## 2015-02-11 NOTE — Telephone Encounter (Signed)
Pt aware of lab results 

## 2015-02-25 ENCOUNTER — Telehealth: Payer: Self-pay | Admitting: *Deleted

## 2015-02-25 MED ORDER — METFORMIN HCL 500 MG PO TABS: 500.0000 mg | ORAL_TABLET | Freq: Every day | ORAL | Status: DC

## 2015-02-25 NOTE — Telephone Encounter (Signed)
Pt left msg on triage requesting call bck. Called pt back she is needing a rx sent to [facility] on her Metformin. Inform pt will send electronically...Johny Chess

## 2015-03-04 ENCOUNTER — Ambulatory Visit (INDEPENDENT_AMBULATORY_CARE_PROVIDER_SITE_OTHER): Payer: PPO | Admitting: Family

## 2015-03-04 ENCOUNTER — Encounter: Payer: Self-pay | Admitting: Family

## 2015-03-04 DIAGNOSIS — R0789 Other chest pain: Secondary | ICD-10-CM

## 2015-03-04 NOTE — Progress Notes (Signed)
Pre visit review using our clinic review tool, if applicable. No additional management support is needed unless otherwise documented below in the visit note. 

## 2015-03-04 NOTE — Progress Notes (Signed)
Subjective:    Patient ID: Angela LILIENTHAL, female    DOB: 1945-02-05, 70 y.o.   MRN: [id number]  Chief Complaint  Patient presents with  . EKG    Pt states that Dr. Quay Burow wanted her to get an EKG    HPI:  Angela Simmons is a 70 y.o. female who  has a past medical history of HTN (hypertension); Obesity; GERD (gastroesophageal reflux disease); Diabetes mellitus without complication ([place]); and Breast cancer ([place]) (1990s). and presents today for a follow up office visit.   Atypical chest pain -  Recently evaluated in the office for symptoms of cardiovascular disease and atypical chest. It was recommended that she complete a stress test. It has been about 4 weeks since had the last episode of chest pain which was located in the center of the chest. Since being off of the prednisone she has not had any issues. No shortness of breath, heart palpitations, or dyspnea on exertion. Request an EKG.   Allergies  Allergen Reactions  . Levofloxacin Hives and Itching  . Nitrofurantoin Hives and Itching  . Penicillins Hives and Itching  . Sulfonamide Derivatives Itching     Current Outpatient Prescriptions on File Prior to Visit  Medication Sig Dispense Refill  . amLODipine (NORVASC) 10 MG tablet Take 1 tablet (10 mg total) by mouth daily. 90 tablet 1  . aspirin (ADULT ASPIRIN EC LOW STRENGTH) 81 MG EC tablet Take 81 mg by mouth daily.     . benazepril (LOTENSIN) 40 MG tablet Take 1 tablet (40 mg total) by mouth daily. 90 tablet 1  . cloNIDine (CATAPRES) 0.1 MG tablet TAKE 1 TABLET (0.1 MG TOTAL) BY MOUTH AT BEDTIME. 90 tablet 1  . fluticasone (FLONASE) 50 MCG/ACT nasal spray USE 2 SPRAYS IN EACH NOSTRIL EVERY DAY 48 g 0  . glucose blood (FORA V30A BLOOD GLUCOSE TEST) test strip Once daily testing 50 each 5  . Lancets (UNILET EXCELITE II) MISC TEST ONCE DAILY 100 each 0  . loratadine (CLARITIN) 10 MG tablet Take 1 tablet (10 mg total) by mouth daily. 30 tablet 2  . lovastatin (MEVACOR) 40 MG  tablet TAKE 1 TABLET EVERY DAY 90 tablet 1  . metFORMIN (GLUCOPHAGE) 500 MG tablet Take 1 tablet (500 mg total) by mouth daily with breakfast. 90 tablet 1  . Multiple Vitamin (MULTIVITAMIN) tablet Take 1 tablet by mouth daily.    . Omega-3 Fatty Acids (FISH OIL) 1000 MG CAPS Take 1 capsule by mouth daily.     . pantoprazole (PROTONIX) 40 MG tablet Take 1 tablet (40 mg total) by mouth daily. 90 tablet 1  . ranitidine (ZANTAC) 150 MG tablet Take 1 tablet (150 mg total) by mouth at bedtime. 90 tablet 1  . calcium-vitamin D (OSCAL WITH D) 500-200 MG-UNIT per tablet Take 1 tablet by mouth daily with breakfast.     No current facility-administered medications on file prior to visit.    Review of Systems  Constitutional: Negative for fever and chills.  Respiratory: Negative for cough, chest tightness, shortness of breath and wheezing.   Cardiovascular: Negative for chest pain, palpitations and leg swelling.  Neurological: Negative for dizziness and weakness.      Objective:    BP 124/84 mmHg  Pulse 78  Temp(Src) 97.7 F (36.5 C) (Oral)  Resp 16  Ht 5\' 9"  (1.753 m)  Wt 193 lb 3.2 oz (87.635 kg)  BMI 28.52 kg/m2  SpO2 97% Nursing note and vital signs reviewed.  Physical Exam  Constitutional: She is oriented to person, place, and time. She appears well-developed and well-nourished. No distress.  Cardiovascular: Normal rate, regular rhythm, normal heart sounds and intact distal pulses.   Pulmonary/Chest: Effort normal and breath sounds normal.  Neurological: She is alert and oriented to person, place, and time.  Skin: Skin is warm and dry.  Psychiatric: She has a normal mood and affect. Her behavior is normal. Judgment and thought content normal.       Assessment & Plan:   Problem List Items Addressed This Visit      Other   Chest pain, atypical    Symptoms of atypical chest pain appear resolved with the completion of a previous dose of prednisone. No symptoms currently or within  last 4 weeks. In office EKG shows normal sinus rhythm. Recommend continuing to monitor at this time and follow-up if symptoms return or worsen. Follow-up with PCP as needed.       Other Visit Diagnoses    Other chest pain    -  Primary    Relevant Orders    EKG 12-Lead (Completed)

## 2015-03-04 NOTE — Assessment & Plan Note (Signed)
Symptoms of atypical chest pain appear resolved with the completion of a previous dose of prednisone. No symptoms currently or within last 4 weeks. In office EKG shows normal sinus rhythm. Recommend continuing to monitor at this time and follow-up if symptoms return or worsen. Follow-up with PCP as needed.

## 2015-03-04 NOTE — Patient Instructions (Addendum)
Thank you for choosing [facility].  Summary/Instructions:  Please continue to take your medications as prescribed.   Your EKG today looks good.  Please follow up with Dr. Estanislado Pandy as scheduled.   If your symptoms worsen or fail to improve, please contact our office for further instruction, or in case of emergency go directly to the emergency room at the closest medical facility.

## 2015-03-07 ENCOUNTER — Encounter: Payer: Self-pay | Admitting: Internal Medicine

## 2015-03-11 ENCOUNTER — Telehealth: Payer: Self-pay | Admitting: Internal Medicine

## 2015-03-11 DIAGNOSIS — M79671 Pain in right foot: Secondary | ICD-10-CM | POA: Diagnosis not present

## 2015-03-11 DIAGNOSIS — M3219 Other organ or system involvement in systemic lupus erythematosus: Secondary | ICD-10-CM | POA: Diagnosis not present

## 2015-03-11 DIAGNOSIS — Z09 Encounter for follow-up examination after completed treatment for conditions other than malignant neoplasm: Secondary | ICD-10-CM | POA: Diagnosis not present

## 2015-03-11 DIAGNOSIS — M25561 Pain in right knee: Secondary | ICD-10-CM | POA: Diagnosis not present

## 2015-03-11 NOTE — Telephone Encounter (Signed)
Tried calling back. Phone was off and mail box was full. Unable to leave message

## 2015-03-11 NOTE — Telephone Encounter (Signed)
Angela Simmons is calling to request that you call her. She would not give me any information as to what the call is pertaining to.

## 2015-03-18 ENCOUNTER — Encounter: Payer: Self-pay | Admitting: Internal Medicine

## 2015-03-18 DIAGNOSIS — M359 Systemic involvement of connective tissue, unspecified: Secondary | ICD-10-CM | POA: Insufficient documentation

## 2015-03-18 DIAGNOSIS — M1990 Unspecified osteoarthritis, unspecified site: Secondary | ICD-10-CM | POA: Insufficient documentation

## 2015-03-25 ENCOUNTER — Other Ambulatory Visit: Payer: Self-pay | Admitting: Family Medicine

## 2015-03-26 ENCOUNTER — Other Ambulatory Visit: Payer: Self-pay | Admitting: *Deleted

## 2015-03-26 MED ORDER — LOVASTATIN 40 MG PO TABS: 40.0000 mg | ORAL_TABLET | Freq: Every day | ORAL | Status: DC

## 2015-03-26 MED ORDER — CLONIDINE HCL 0.1 MG PO TABS: ORAL_TABLET | ORAL | Status: DC

## 2015-03-26 NOTE — Telephone Encounter (Signed)
Received call from [facility]. Pt is wanting refills on her clonidine & lovastatin. Gave verbal authorization on meds. Epic has been updated...Johny Chess

## 2015-04-09 DIAGNOSIS — B351 Tinea unguium: Secondary | ICD-10-CM | POA: Diagnosis not present

## 2015-04-09 DIAGNOSIS — E1142 Type 2 diabetes mellitus with diabetic polyneuropathy: Secondary | ICD-10-CM | POA: Diagnosis not present

## 2015-04-09 DIAGNOSIS — L84 Corns and callosities: Secondary | ICD-10-CM | POA: Diagnosis not present

## 2015-04-30 ENCOUNTER — Other Ambulatory Visit: Payer: Self-pay | Admitting: Emergency Medicine

## 2015-04-30 MED ORDER — BENAZEPRIL HCL 40 MG PO TABS: 40.0000 mg | ORAL_TABLET | Freq: Every day | ORAL | Status: DC

## 2015-04-30 MED ORDER — PANTOPRAZOLE SODIUM 40 MG PO TBEC: 40.0000 mg | DELAYED_RELEASE_TABLET | Freq: Every day | ORAL | Status: DC

## 2015-04-30 MED ORDER — AMLODIPINE BESYLATE 10 MG PO TABS: 10.0000 mg | ORAL_TABLET | Freq: Every day | ORAL | Status: DC

## 2015-05-09 DIAGNOSIS — R52 Pain, unspecified: Secondary | ICD-10-CM | POA: Diagnosis not present

## 2015-05-09 DIAGNOSIS — Z79899 Other long term (current) drug therapy: Secondary | ICD-10-CM | POA: Diagnosis not present

## 2015-05-09 DIAGNOSIS — R300 Dysuria: Secondary | ICD-10-CM | POA: Diagnosis not present

## 2015-05-09 DIAGNOSIS — E559 Vitamin D deficiency, unspecified: Secondary | ICD-10-CM | POA: Diagnosis not present

## 2015-05-09 DIAGNOSIS — M3219 Other organ or system involvement in systemic lupus erythematosus: Secondary | ICD-10-CM | POA: Diagnosis not present

## 2015-05-09 DIAGNOSIS — M174 Other bilateral secondary osteoarthritis of knee: Secondary | ICD-10-CM | POA: Diagnosis not present

## 2015-05-09 DIAGNOSIS — M79641 Pain in right hand: Secondary | ICD-10-CM | POA: Diagnosis not present

## 2015-05-09 DIAGNOSIS — R5381 Other malaise: Secondary | ICD-10-CM | POA: Diagnosis not present

## 2015-05-09 DIAGNOSIS — M2550 Pain in unspecified joint: Secondary | ICD-10-CM | POA: Diagnosis not present

## 2015-05-09 DIAGNOSIS — Z09 Encounter for follow-up examination after completed treatment for conditions other than malignant neoplasm: Secondary | ICD-10-CM | POA: Diagnosis not present

## 2015-05-10 DIAGNOSIS — E559 Vitamin D deficiency, unspecified: Secondary | ICD-10-CM | POA: Diagnosis not present

## 2015-05-10 DIAGNOSIS — R52 Pain, unspecified: Secondary | ICD-10-CM | POA: Diagnosis not present

## 2015-05-10 DIAGNOSIS — Z79899 Other long term (current) drug therapy: Secondary | ICD-10-CM | POA: Diagnosis not present

## 2015-05-10 DIAGNOSIS — R5381 Other malaise: Secondary | ICD-10-CM | POA: Diagnosis not present

## 2015-05-10 DIAGNOSIS — R300 Dysuria: Secondary | ICD-10-CM | POA: Diagnosis not present

## 2015-05-10 DIAGNOSIS — M2550 Pain in unspecified joint: Secondary | ICD-10-CM | POA: Diagnosis not present

## 2015-05-15 DIAGNOSIS — M25511 Pain in right shoulder: Secondary | ICD-10-CM | POA: Diagnosis not present

## 2015-05-15 DIAGNOSIS — Z79899 Other long term (current) drug therapy: Secondary | ICD-10-CM | POA: Diagnosis not present

## 2015-05-15 DIAGNOSIS — M25551 Pain in right hip: Secondary | ICD-10-CM | POA: Diagnosis not present

## 2015-05-15 DIAGNOSIS — M79641 Pain in right hand: Secondary | ICD-10-CM | POA: Diagnosis not present

## 2015-07-11 DIAGNOSIS — M2550 Pain in unspecified joint: Secondary | ICD-10-CM | POA: Diagnosis not present

## 2015-07-11 DIAGNOSIS — M353 Polymyalgia rheumatica: Secondary | ICD-10-CM | POA: Diagnosis not present

## 2015-07-11 DIAGNOSIS — Z09 Encounter for follow-up examination after completed treatment for conditions other than malignant neoplasm: Secondary | ICD-10-CM | POA: Diagnosis not present

## 2015-07-11 DIAGNOSIS — Z79899 Other long term (current) drug therapy: Secondary | ICD-10-CM | POA: Diagnosis not present

## 2015-07-11 DIAGNOSIS — M791 Myalgia: Secondary | ICD-10-CM | POA: Diagnosis not present

## 2015-07-16 DIAGNOSIS — L84 Corns and callosities: Secondary | ICD-10-CM | POA: Diagnosis not present

## 2015-07-16 DIAGNOSIS — B351 Tinea unguium: Secondary | ICD-10-CM | POA: Diagnosis not present

## 2015-07-16 DIAGNOSIS — E1142 Type 2 diabetes mellitus with diabetic polyneuropathy: Secondary | ICD-10-CM | POA: Diagnosis not present

## 2015-08-02 ENCOUNTER — Ambulatory Visit (INDEPENDENT_AMBULATORY_CARE_PROVIDER_SITE_OTHER): Payer: PPO | Admitting: Internal Medicine

## 2015-08-02 ENCOUNTER — Other Ambulatory Visit (INDEPENDENT_AMBULATORY_CARE_PROVIDER_SITE_OTHER): Payer: PPO

## 2015-08-02 ENCOUNTER — Encounter: Payer: Self-pay | Admitting: Internal Medicine

## 2015-08-02 VITALS — BP 134/86 | HR 92 | Temp 98.3°F | Resp 16 | Ht 69.0 in | Wt 188.0 lb

## 2015-08-02 DIAGNOSIS — E785 Hyperlipidemia, unspecified: Secondary | ICD-10-CM

## 2015-08-02 DIAGNOSIS — M1990 Unspecified osteoarthritis, unspecified site: Secondary | ICD-10-CM

## 2015-08-02 DIAGNOSIS — K219 Gastro-esophageal reflux disease without esophagitis: Secondary | ICD-10-CM | POA: Diagnosis not present

## 2015-08-02 DIAGNOSIS — E1140 Type 2 diabetes mellitus with diabetic neuropathy, unspecified: Secondary | ICD-10-CM | POA: Diagnosis not present

## 2015-08-02 DIAGNOSIS — I10 Essential (primary) hypertension: Secondary | ICD-10-CM

## 2015-08-02 DIAGNOSIS — R0789 Other chest pain: Secondary | ICD-10-CM

## 2015-08-02 LAB — CBC WITH DIFFERENTIAL/PLATELET
BASOS ABS: 0 10*3/uL (ref 0.0–0.1)
Basophils Relative: 0.1 % (ref 0.0–3.0)
EOS PCT: 0.1 % (ref 0.0–5.0)
Eosinophils Absolute: 0 10*3/uL (ref 0.0–0.7)
HCT: 37.2 % (ref 36.0–46.0)
Hemoglobin: 12.2 g/dL (ref 12.0–15.0)
Lymphocytes Relative: 15.4 % (ref 12.0–46.0)
Lymphs Abs: 1.1 10*3/uL (ref 0.7–4.0)
MCHC: 32.8 g/dL (ref 30.0–36.0)
MCV: 87.7 fl (ref 78.0–100.0)
MONOS PCT: 3.6 % (ref 3.0–12.0)
Monocytes Absolute: 0.3 10*3/uL (ref 0.1–1.0)
Neutro Abs: 5.8 10*3/uL (ref 1.4–7.7)
Neutrophils Relative %: 80.8 % — ABNORMAL HIGH (ref 43.0–77.0)
Platelets: 239 10*3/uL (ref 150.0–400.0)
RBC: 4.24 Mil/uL (ref 3.87–5.11)
RDW: 15.1 % (ref 11.5–15.5)
WBC: 7.2 10*3/uL (ref 4.0–10.5)

## 2015-08-02 LAB — COMPREHENSIVE METABOLIC PANEL
ALT: 13 U/L (ref 0–35)
AST: 11 U/L (ref 0–37)
Albumin: 4 g/dL (ref 3.5–5.2)
Alkaline Phosphatase: 54 U/L (ref 39–117)
BUN: 19 mg/dL (ref 6–23)
CHLORIDE: 102 meq/L (ref 96–112)
CO2: 27 mEq/L (ref 19–32)
Calcium: 9.5 mg/dL (ref 8.4–10.5)
Creatinine, Ser: 0.99 mg/dL (ref 0.40–1.20)
GFR: 71.49 mL/min (ref >60.00)
Glucose, Bld: 114 mg/dL — ABNORMAL HIGH (ref 70–99)
POTASSIUM: 4.1 meq/L (ref 3.5–5.1)
Sodium: 139 mEq/L (ref 135–145)
Total Bilirubin: 0.4 mg/dL (ref 0.2–1.2)
Total Protein: 7.7 g/dL (ref 6.0–8.3)

## 2015-08-02 LAB — HEMOGLOBIN A1C: Hgb A1c MFr Bld: 6.3 % (ref 4.6–6.5)
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
## 2015-08-02 NOTE — Progress Notes (Signed)
Pre visit review using our clinic review tool, if applicable. No additional management support is needed unless otherwise documented below in the visit note. 

## 2015-08-02 NOTE — Assessment & Plan Note (Signed)
GERD controlled Continue daily medication  

## 2015-08-02 NOTE — Assessment & Plan Note (Signed)
Sugars well controlled at home, despite being on prednisone - sugars actually on low side Will continue metformin at current dose given prednisone Advised her to call if she gets more low sugars Exercise, diabetic diet Slight dec sensation in feet - stressed checking feet daily Check a1c

## 2015-08-02 NOTE — Assessment & Plan Note (Signed)
Recent EKG normal, still has chest pain - very atypical Stress test had been ordered - but not done - will make sure it is done given her risk factors

## 2015-08-02 NOTE — Assessment & Plan Note (Signed)
BP well controlled Current regimen effective and well tolerated Continue current medications at current doses  

## 2015-08-02 NOTE — Assessment & Plan Note (Signed)
Has been well controlled Continue lovastatin at current dose

## 2015-08-02 NOTE — Patient Instructions (Signed)
  Test(s) ordered today. Your results will be released to MyChart (or called to you) after review, usually within 72hours after test completion. If any changes need to be made, you will be notified at that same time. .   Medications reviewed and updated.  No changes recommended at this time.    Please followup in 6 months for a physical   

## 2015-08-02 NOTE — Assessment & Plan Note (Signed)
Started on prednisone taper and plaquenil

## 2015-08-03 ENCOUNTER — Other Ambulatory Visit: Payer: Self-pay | Admitting: Internal Medicine

## 2015-08-03 DIAGNOSIS — R0789 Other chest pain: Secondary | ICD-10-CM

## 2015-08-07 ENCOUNTER — Ambulatory Visit: Payer: PPO | Admitting: Internal Medicine

## 2015-08-09 DIAGNOSIS — C50911 Malignant neoplasm of unspecified site of right female breast: Secondary | ICD-10-CM | POA: Diagnosis not present

## 2015-08-14 ENCOUNTER — Other Ambulatory Visit: Payer: Self-pay | Admitting: *Deleted

## 2015-08-14 MED ORDER — METFORMIN HCL 500 MG PO TABS: 500.0000 mg | ORAL_TABLET | Freq: Every day | ORAL | 3 refills | Status: DC

## 2015-09-13 ENCOUNTER — Telehealth: Payer: Self-pay | Admitting: Internal Medicine

## 2015-09-13 NOTE — Telephone Encounter (Signed)
Pt request to speak to the assistant concern about medicine. Please call her back

## 2015-09-13 NOTE — Telephone Encounter (Signed)
Tried calling pt back, unable to LVM 

## 2015-09-17 NOTE — Telephone Encounter (Signed)
Spoke with pt, informed her to Call rheumatology to inquire about decreasing the prednisone dose bc they prescribed it.

## 2015-09-30 ENCOUNTER — Telehealth (HOSPITAL_COMMUNITY): Payer: Self-pay | Admitting: *Deleted

## 2015-09-30 NOTE — Telephone Encounter (Signed)
Patient given detailed instructions per Stress Test Requisition Sheet for test on 10/02/15 at 7:30.Patient Notified to arrive 30 minutes early, and that it is imperative to arrive on time for appointment to keep from having the test rescheduled.  Patient verbalized understanding. Angela Simmons

## 2015-10-02 ENCOUNTER — Other Ambulatory Visit (HOSPITAL_COMMUNITY): Payer: PPO

## 2015-10-11 DIAGNOSIS — M359 Systemic involvement of connective tissue, unspecified: Secondary | ICD-10-CM | POA: Diagnosis not present

## 2015-10-11 DIAGNOSIS — M353 Polymyalgia rheumatica: Secondary | ICD-10-CM | POA: Diagnosis not present

## 2015-10-11 DIAGNOSIS — R300 Dysuria: Secondary | ICD-10-CM | POA: Diagnosis not present

## 2015-10-11 DIAGNOSIS — Z79899 Other long term (current) drug therapy: Secondary | ICD-10-CM | POA: Diagnosis not present

## 2015-10-11 DIAGNOSIS — R6889 Other general symptoms and signs: Secondary | ICD-10-CM | POA: Diagnosis not present

## 2015-10-11 DIAGNOSIS — R700 Elevated erythrocyte sedimentation rate: Secondary | ICD-10-CM | POA: Diagnosis not present

## 2015-10-11 DIAGNOSIS — M2550 Pain in unspecified joint: Secondary | ICD-10-CM | POA: Diagnosis not present

## 2015-10-11 DIAGNOSIS — E559 Vitamin D deficiency, unspecified: Secondary | ICD-10-CM | POA: Diagnosis not present

## 2015-10-11 DIAGNOSIS — R5381 Other malaise: Secondary | ICD-10-CM | POA: Diagnosis not present

## 2015-10-11 LAB — BASIC METABOLIC PANEL
BUN: 13 mg/dL (ref 4–21)
Creatinine: 0.9 mg/dL (ref 0.5–1.1)
GLUCOSE: 88 mg/dL
Potassium: 4.2 mmol/L (ref 3.4–5.3)
SODIUM: 141 mmol/L (ref 137–147)

## 2015-10-11 LAB — POCT ERYTHROCYTE SEDIMENTATION RATE, NON-AUTOMATED: SED RATE: 9 mm

## 2015-10-11 LAB — HEPATIC FUNCTION PANEL
ALT: 10 U/L (ref 7–35)
AST: 13 U/L (ref 13–35)
Alkaline Phosphatase: 46 U/L (ref 25–125)
BILIRUBIN, TOTAL: 0.4 mg/dL

## 2015-10-11 LAB — CBC AND DIFFERENTIAL
HEMATOCRIT: 37 % (ref 36–46)
HEMOGLOBIN: 12.1 g/dL (ref 12.0–16.0)
PLATELETS: 261 10*3/uL (ref 150–399)
WBC: 5.7 10^3/mL

## 2015-10-15 ENCOUNTER — Telehealth: Payer: Self-pay | Admitting: Internal Medicine

## 2015-10-16 ENCOUNTER — Other Ambulatory Visit: Payer: Self-pay | Admitting: Emergency Medicine

## 2015-10-16 MED ORDER — LOVASTATIN 40 MG PO TABS: 40.0000 mg | ORAL_TABLET | Freq: Every day | ORAL | 1 refills | Status: DC

## 2015-10-16 MED ORDER — CLONIDINE HCL 0.1 MG PO TABS: ORAL_TABLET | ORAL | 1 refills | Status: DC

## 2015-10-16 NOTE — Telephone Encounter (Signed)
Patient called about some labs results. She was tested at another doctors [phone] for a uti?? She states they told her to call her pcp to get results? I ask why she couldn't get the results from them, she just keeps saying they told her to call her. ?? Please follow up, Thank you.

## 2015-10-16 NOTE — Telephone Encounter (Signed)
Received lab results from Dr Estanislado Pandy. Labs were given to Terri Piedra, NP to look over. States that all blood work looks normal. Urine does show a trace of Leukocytes. Pt is to call back if she does experience any UTI symptoms and we will need to get a culture on her urine.

## 2015-10-17 ENCOUNTER — Encounter: Payer: Self-pay | Admitting: Internal Medicine

## 2015-10-21 ENCOUNTER — Other Ambulatory Visit (HOSPITAL_COMMUNITY): Payer: PPO

## 2015-10-22 DIAGNOSIS — E1142 Type 2 diabetes mellitus with diabetic polyneuropathy: Secondary | ICD-10-CM | POA: Diagnosis not present

## 2015-10-22 DIAGNOSIS — B351 Tinea unguium: Secondary | ICD-10-CM | POA: Diagnosis not present

## 2015-10-22 DIAGNOSIS — L84 Corns and callosities: Secondary | ICD-10-CM | POA: Diagnosis not present

## 2015-11-01 ENCOUNTER — Telehealth (HOSPITAL_COMMUNITY): Payer: Self-pay | Admitting: *Deleted

## 2015-11-01 NOTE — Telephone Encounter (Signed)
Patient given detailed instructions per Stress Test Requisition Sheet for test on 11/05/15 at 2:30.Patient Notified to arrive 30 minutes early, and that it is imperative to arrive on time for appointment to keep from having the test rescheduled.  Patient verbalized understanding. Angela Simmons

## 2015-11-05 ENCOUNTER — Ambulatory Visit (HOSPITAL_COMMUNITY): Payer: PPO | Attending: Cardiology

## 2015-11-05 ENCOUNTER — Ambulatory Visit (HOSPITAL_BASED_OUTPATIENT_CLINIC_OR_DEPARTMENT_OTHER): Payer: PPO

## 2015-11-05 DIAGNOSIS — R0789 Other chest pain: Secondary | ICD-10-CM

## 2015-11-11 ENCOUNTER — Other Ambulatory Visit: Payer: Self-pay | Admitting: Rheumatology

## 2015-11-12 NOTE — Telephone Encounter (Signed)
Mr Angela Simmons  discussed at length the proper dosage and times to take the prednisone and she understands.  She will start 8 mg now and use it in October as well, then she will do 7 mg starting in November, 6 mg starting in December / refilled prednisone per Mr Angela Simmons plan

## 2015-11-18 ENCOUNTER — Telehealth: Payer: Self-pay | Admitting: Radiology

## 2015-11-18 MED ORDER — HYDROXYCHLOROQUINE SULFATE 200 MG PO TABS: 200.0000 mg | ORAL_TABLET | Freq: Two times a day (BID) | ORAL | 0 refills | Status: DC

## 2015-11-18 NOTE — Telephone Encounter (Signed)
Last visit 10/11/15 Next visit not scheduled sent message for appt sch Labs WNL 10/11/15 Eye exam WNL 1.2017 Ok to refill PLQ per SD

## 2015-11-18 NOTE — Telephone Encounter (Signed)
Refill request received via fax for PLQ

## 2015-11-18 NOTE — Telephone Encounter (Signed)
Patient needs follow up appt with Dr Deveshwar pls call to make appt. Feb/ March  

## 2015-11-18 NOTE — Addendum Note (Signed)
Addended byCandice Camp on: 11/18/2015 01:53 PM   Modules accepted: Orders

## 2015-11-19 DIAGNOSIS — E119 Type 2 diabetes mellitus without complications: Secondary | ICD-10-CM | POA: Diagnosis not present

## 2015-11-19 DIAGNOSIS — M0609 Rheumatoid arthritis without rheumatoid factor, multiple sites: Secondary | ICD-10-CM | POA: Diagnosis not present

## 2015-11-21 ENCOUNTER — Other Ambulatory Visit: Payer: Self-pay | Admitting: Rheumatology

## 2015-11-21 DIAGNOSIS — Z79899 Other long term (current) drug therapy: Secondary | ICD-10-CM | POA: Insufficient documentation

## 2015-11-29 ENCOUNTER — Telehealth: Payer: Self-pay | Admitting: Internal Medicine

## 2015-11-29 DIAGNOSIS — Z1231 Encounter for screening mammogram for malignant neoplasm of breast: Secondary | ICD-10-CM

## 2015-11-29 NOTE — Telephone Encounter (Signed)
Patient is requesting referral for mammogram to [facility] for December.

## 2015-11-29 NOTE — Telephone Encounter (Signed)
ordered

## 2015-11-29 NOTE — Telephone Encounter (Signed)
Please advise 

## 2016-01-02 ENCOUNTER — Other Ambulatory Visit: Payer: Self-pay | Admitting: Internal Medicine

## 2016-01-02 ENCOUNTER — Ambulatory Visit (HOSPITAL_COMMUNITY)
Admission: RE | Admit: 2016-01-02 | Discharge: 2016-01-02 | Disposition: A | Discharge status: Home / Self Care | Payer: PPO | Source: Ambulatory Visit | Attending: Internal Medicine | Admitting: Internal Medicine

## 2016-01-02 ENCOUNTER — Ambulatory Visit (HOSPITAL_COMMUNITY): Payer: PPO

## 2016-01-02 DIAGNOSIS — Z1231 Encounter for screening mammogram for malignant neoplasm of breast: Secondary | ICD-10-CM

## 2016-01-09 DIAGNOSIS — E119 Type 2 diabetes mellitus without complications: Secondary | ICD-10-CM | POA: Diagnosis not present

## 2016-01-09 DIAGNOSIS — H40013 Open angle with borderline findings, low risk, bilateral: Secondary | ICD-10-CM | POA: Diagnosis not present

## 2016-01-09 DIAGNOSIS — Z79899 Other long term (current) drug therapy: Secondary | ICD-10-CM | POA: Diagnosis not present

## 2016-01-09 LAB — HM DIABETES EYE EXAM

## 2016-01-21 DIAGNOSIS — E1142 Type 2 diabetes mellitus with diabetic polyneuropathy: Secondary | ICD-10-CM | POA: Diagnosis not present

## 2016-01-21 DIAGNOSIS — B351 Tinea unguium: Secondary | ICD-10-CM | POA: Diagnosis not present

## 2016-01-21 DIAGNOSIS — L84 Corns and callosities: Secondary | ICD-10-CM | POA: Diagnosis not present

## 2016-02-04 ENCOUNTER — Encounter: Payer: Self-pay | Admitting: Geriatric Medicine

## 2016-02-06 ENCOUNTER — Encounter: Payer: PPO | Admitting: Internal Medicine

## 2016-02-18 ENCOUNTER — Telehealth: Payer: Self-pay | Admitting: Radiology

## 2016-02-18 MED ORDER — HYDROXYCHLOROQUINE SULFATE 200 MG PO TABS: 200.0000 mg | ORAL_TABLET | Freq: Two times a day (BID) | ORAL | 0 refills | Status: DC

## 2016-02-18 NOTE — Addendum Note (Signed)
Addended byCandice Camp on: 02/18/2016 03:40 PM   Modules accepted: Orders

## 2016-02-18 NOTE — Telephone Encounter (Addendum)
Last visit 10/11/15 Next visit 03/2016 Labs WNL 10/11/15 Eye exam WNL 1.2017 Ok to refill PLQ per SD  Called her to advise labs are due in Feb she has voiced understanding.

## 2016-02-18 NOTE — Telephone Encounter (Signed)
Refill request received via fax for [facility] drug

## 2016-02-20 ENCOUNTER — Other Ambulatory Visit: Payer: Self-pay | Admitting: *Deleted

## 2016-02-20 MED ORDER — HYDROXYCHLOROQUINE SULFATE 200 MG PO TABS
200.0000 mg | ORAL_TABLET | Freq: Two times a day (BID) | ORAL | 1 refills | Status: DC
Start: 2016-02-20 — End: 2016-03-30

## 2016-02-20 NOTE — Progress Notes (Addendum)
Subjective:   Angela Simmons is a 71 y.o. female who presents for Medicare Annual (Subsequent) preventive examination.  Review of Systems:  No ROS.  Medicare Wellness Visit.  Cardiac Risk Factors include: diabetes mellitus;hypertension;advanced age (>60men, >28 women);sedentary lifestyle;dyslipidemia Sleep patterns: no sleep issues, gets up 3-4 times nightly to void and sleeps 8-9 hours nightly.    Home Safety/Smoke Alarms:  Feels safe in home. Smoke alarms in place.  Living environment; residence and Firearm Safety: 1-story house/ trailer, firearms stored safely. Lives with husband and son. Seat Belt Safety/Bike Helmet: Wears seat belt.   Counseling:   Eye Exam- Dr. Idolina Primer last visit 11/2015  Dental- Does not follow with dentist.  Female:   Pap- N/A hysterectomy     Mammo-   01/02/16, normal findings   Dexa scan-  11/22/12,   Normal bone density    CCS- 06/22/11, diverticulosis, hemorrhoids, recall 10 years     Objective:     Vitals: BP (!) 155/82 (BP Location: Right Arm, Patient Position: Sitting, Cuff Size: Normal)   Pulse 83   Ht 5\' 9"  (1.753 m)   Wt 194 lb (88 kg)   SpO2 99%   BMI 28.65 kg/m   Body mass index is 28.65 kg/m.  Wt Readings from Last 3 Encounters:  02/21/16 194 lb (88 kg)  08/02/15 188 lb (85.3 kg)  03/04/15 193 lb 3.2 oz (87.6 kg)   Tobacco History  Smoking Status  . Never Smoker  Smokeless Tobacco  . Never Used     Counseling given: Not Answered   Past Medical History:  Diagnosis Date  . Breast cancer ([place]) 1990s   right breast  . Diabetes mellitus without complication ([place])   . GERD (gastroesophageal reflux disease)   . HTN (hypertension)   . Obesity    Past Surgical History:  Procedure Laterality Date  . ABDOMINAL HYSTERECTOMY     [place], fibroids  . COLONOSCOPY  May 2003   Dr. Tamala Julian: normal  . COLONOSCOPY  06/22/2011   Procedure: COLONOSCOPY;  Surgeon: Danie Binder, MD;  Location[facility];  Service: Endoscopy;  Laterality:  N/A;  10:30  . MASTECTOMY     right    Family History  Problem Relation Age of Onset  . Diabetes Mother   . Heart failure Mother   . Diabetes Father   . Emphysema Father   . Diabetes Brother   . Diabetes Sister   . Colon cancer Neg Hx    History  Sexual Activity  . Sexual activity: No    Outpatient Encounter Prescriptions as of 02/21/2016  Medication Sig  . amLODipine (NORVASC) 10 MG tablet Take 1 tablet (10 mg total) by mouth daily.  Marland Kitchen aspirin (ADULT ASPIRIN EC LOW STRENGTH) 81 MG EC tablet Take 81 mg by mouth daily.   . benazepril (LOTENSIN) 40 MG tablet Take 1 tablet (40 mg total) by mouth daily.  . cloNIDine (CATAPRES) 0.1 MG tablet TAKE 1 TABLET (0.1 MG TOTAL) BY MOUTH AT BEDTIME.  . fluticasone (FLONASE) 50 MCG/ACT nasal spray USE 2 SPRAYS IN EACH NOSTRIL EVERY DAY (Patient taking differently: USE 2 SPRAYS IN EACH NOSTRIL EVERY DAY PRN)  . glucose blood (FORA V30A BLOOD GLUCOSE TEST) test strip Once daily testing  . hydroxychloroquine (PLAQUENIL) 200 MG tablet Take 1 tablet (200 mg total) by mouth 2 (two) times daily.  . Lancets (UNILET EXCELITE II) MISC TEST ONCE DAILY  . loratadine (CLARITIN) 10 MG tablet TAKE 1 TABLET BY MOUTH  EVERY DAY (Patient taking differently: TAKE 1 TABLET BY MOUTH EVERY DAY PRN)  . lovastatin (MEVACOR) 40 MG tablet Take 1 tablet (40 mg total) by mouth daily.  . metFORMIN (GLUCOPHAGE) 500 MG tablet Take 1 tablet (500 mg total) by mouth daily with breakfast.  . Multiple Vitamin (MULTIVITAMIN) tablet Take 1 tablet by mouth daily.  . Omega-3 Fatty Acids (FISH OIL) 1000 MG CAPS Take 1 capsule by mouth daily.   . pantoprazole (PROTONIX) 40 MG tablet Take 1 tablet (40 mg total) by mouth daily.  . predniSONE (DELTASONE) 1 MG tablet 3 q am October,2 q am November, 1 q am December (with 5mg  daily to equal *mg, &mg, then 6 mg) (Patient taking differently: Take 1 mg by mouth daily. 3 q am October,2 q am November, 1 q am December (with 5mg  daily to equal *mg, &mg,  then 6 mg))  . ranitidine (ZANTAC) 150 MG tablet Take 1 tablet (150 mg total) by mouth at bedtime.  . calcium-vitamin D (OSCAL WITH D) 500-200 MG-UNIT per tablet Take 1 tablet by mouth daily with breakfast.   No facility-administered encounter medications on file as of 02/21/2016.     Activities of Daily Living In your present state of health, do you have any difficulty performing the following activities: 02/21/2016  Hearing? N  Vision? N  Difficulty concentrating or making decisions? N  Walking or climbing stairs? N  Dressing or bathing? N  Doing errands, shopping? N  Preparing Food and eating ? N  Using the Toilet? N  In the past six months, have you accidently leaked urine? N  Do you have problems with loss of bowel control? N  Managing your Medications? N  Managing your Finances? N  Housekeeping or managing your Housekeeping? N  Some recent data might be hidden    Patient Care Team: Binnie Rail, MD as PCP - General (Internal Medicine) Danie Binder, MD (Gastroenterology) Bo Merino, MD as Consulting Physician (Rheumatology)    Assessment:    Physical assessment deferred to PCP.  Exercise Activities and Dietary recommendations Current Exercise Habits: The patient does not participate in regular exercise at present  Diet (meal preparation, eat out, water intake, caffeinated beverages, dairy products, fruits and vegetables): Eats out 50%  Water x3-4 bottle per day. Breakfast: oatmeal, eggs, biscuit Lunch: salad, crackers, fruits Dinner:  Ham, vegetables, cornbread, chicken, fish    Goals    . Reduce fast food intake      Fall Risk Fall Risk  02/21/2016 08/02/2015 11/23/2013 11/14/2012  Falls in the past year? Yes No No No  Number falls in past yr: 1 - - -   Depression Screen PHQ 2/9 Scores 02/21/2016 08/02/2015 11/23/2013 11/14/2012  PHQ - 2 Score 0 0 2 0  PHQ- 9 Score - - 7 -     Cognitive Function MMSE - Mini Mental State Exam 02/21/2016  Orientation to time  5  Orientation to Place 5  Registration 3  Attention/ Calculation 0  Attention/Calculation-comments patient states she cannot spell or count  Recall 3  Language- name 2 objects 2  Language- repeat 1  Language- follow 3 step command 3  Language- read & follow direction 1  Write a sentence 1  Copy design 0  Total score 24        Immunization History  Administered Date(s) Administered  . Influenza Split 11/14/2010  . Influenza Whole 10/18/2006, 10/31/2008, 11/06/2009  . Influenza, High Dose Seasonal PF 11/22/2014  . Influenza,inj,Quad PF,36+ Mos  11/14/2012, 11/23/2013  . Pneumococcal Conjugate-13 04/11/2014  . Pneumococcal Polysaccharide-23 09/17/2011  . Td 07/05/2003  . Zoster 06/15/2013   Screening Tests Health Maintenance  Topic Date Due  . TETANUS/TDAP  07/04/2013  . INFLUENZA VACCINE  08/20/2015  . HEMOGLOBIN A1C  02/02/2016  . FOOT EXAM  08/01/2016  . OPHTHALMOLOGY EXAM  01/08/2017  . DEXA SCAN  11/22/2017  . MAMMOGRAM  01/01/2018  . COLONOSCOPY  06/21/2021  . ZOSTAVAX  Completed  . Hepatitis C Screening  Completed  . PNA vac Low Risk Adult  Completed      Plan:     Continue to eat heart healthy diet (full of fruits, vegetables, whole grains, lean protein, water--limit salt, fat, and sugar intake) and increase physical activity as tolerated.  Continue doing brain stimulating activities (puzzles, reading, adult coloring books, staying active) to keep memory sharp.   Dental resources given to patient  Dexa scan ordered for patient  Flu shot given to patient  During the course of the visit the patient was educated and counseled about the following appropriate screening and preventive services:   Vaccines to include Pneumoccal, Influenza, Hepatitis B, Td, Zostavax, HCV  Cardiovascular Disease  Colorectal cancer screening  Bone density screening  Diabetes screening  Glaucoma screening  Mammography/PAP  Nutrition counseling   Patient Instructions  (the written plan) was given to the patient.   Michiel Cowboy, RN  02/21/2016   Medical screening examination/treatment/procedure(s) were performed by non-physician practitioner and as supervising physician I was immediately available for consultation/collaboration. I agree with above. Binnie Rail, MD

## 2016-02-20 NOTE — Progress Notes (Signed)
Pre visit review using our clinic review tool, if applicable. No additional management support is needed unless otherwise documented below in the visit note. 

## 2016-02-21 ENCOUNTER — Ambulatory Visit (INDEPENDENT_AMBULATORY_CARE_PROVIDER_SITE_OTHER): Payer: PPO | Admitting: *Deleted

## 2016-02-21 ENCOUNTER — Encounter: Payer: Self-pay | Admitting: *Deleted

## 2016-02-21 VITALS — BP 155/82 | HR 83 | Ht 69.0 in | Wt 194.0 lb

## 2016-02-21 DIAGNOSIS — Z0000 Encounter for general adult medical examination without abnormal findings: Secondary | ICD-10-CM

## 2016-02-21 DIAGNOSIS — Z780 Asymptomatic menopausal state: Secondary | ICD-10-CM

## 2016-02-21 DIAGNOSIS — Z23 Encounter for immunization: Secondary | ICD-10-CM | POA: Diagnosis not present

## 2016-02-21 NOTE — Patient Instructions (Addendum)
[name] , Thank you for taking time to come for your Medicare Wellness Visit. I appreciate your ongoing commitment to your health goals. Please review the following plan we discussed and let me know if I can assist you in the future.   Bring a copy of your advance directives to your next office visit.  You got your flu shot today.  Influenza (Flu) Vaccine (Inactivated or Recombinant): What You Need to Know 1. Why get vaccinated? Influenza ("flu") is a contagious disease that spreads around the [place] every year, usually between October and May. Flu is caused by influenza viruses, and is spread mainly by coughing, sneezing, and close contact. Anyone can get flu. Flu strikes suddenly and can last several days. Symptoms vary by age, but can include:  fever/chills  sore throat  muscle aches  fatigue  cough  headache  runny or stuffy nose Flu can also lead to pneumonia and blood infections, and cause diarrhea and seizures in children. If you have a medical condition, such as heart or lung disease, flu can make it worse. Flu is more dangerous for some people. Infants and young children, people 1 years of age and older, pregnant women, and people with certain health conditions or a weakened immune system are at greatest risk. Each year thousands of people in the [place] die from flu, and many more are hospitalized. Flu vaccine can:  keep you from getting flu,  make flu less severe if you do get it, and  keep you from spreading flu to your family and other people. 2. Inactivated and recombinant flu vaccines A dose of flu vaccine is recommended every flu season. Children 6 months through 54 years of age may need two doses during the same flu season. Everyone else needs only one dose each flu season. Some inactivated flu vaccines contain a very small amount of a mercury-based preservative called thimerosal. Studies have not shown thimerosal in vaccines to be harmful,  but flu vaccines that do not contain thimerosal are available. There is no live flu virus in flu shots. They cannot cause the flu. There are many flu viruses, and they are always changing. Each year a new flu vaccine is made to protect against three or four viruses that are likely to cause disease in the upcoming flu season. But even when the vaccine doesn't exactly match these viruses, it may still provide some protection. Flu vaccine cannot prevent:  flu that is caused by a virus not covered by the vaccine, or  illnesses that look like flu but are not. It takes about 2 weeks for protection to develop after vaccination, and protection lasts through the flu season. 3. Some people should not get this vaccine Tell the person who is giving you the vaccine:  If you have any severe, life-threatening allergies. If you ever had a life-threatening allergic reaction after a dose of flu vaccine, or have a severe allergy to any part of this vaccine, you may be advised not to get vaccinated. Most, but not all, types of flu vaccine contain a small amount of egg protein.  If you ever had Guillain-Barr Syndrome (also called GBS). Some people with a history of GBS should not get this vaccine. This should be discussed with your doctor.  If you are not feeling well. It is usually okay to get flu vaccine when you have a mild illness, but you might be asked to come back when you feel better. 4. Risks of a vaccine reaction  With any medicine, including vaccines, there is a chance of reactions. These are usually mild and go away on their own, but serious reactions are also possible. Most people who get a flu shot do not have any problems with it. Minor problems following a flu shot include:  soreness, redness, or swelling where the shot was given  hoarseness  sore, red or itchy eyes  cough  fever  aches  headache  itching  fatigue If these problems occur, they usually begin soon after the shot and  last 1 or 2 days. More serious problems following a flu shot can include the following:  There may be a small increased risk of Guillain-Barre Syndrome (GBS) after inactivated flu vaccine. This risk has been estimated at 1 or 2 additional cases per million people vaccinated. This is much lower than the risk of severe complications from flu, which can be prevented by flu vaccine.  Young children who get the flu shot along with pneumococcal vaccine (PCV13) and/or DTaP vaccine at the same time might be slightly more likely to have a seizure caused by fever. Ask your doctor for more information. Tell your doctor if a child who is getting flu vaccine has ever had a seizure. Problems that could happen after any injected vaccine:  People sometimes faint after a medical procedure, including vaccination. Sitting or lying down for about 15 minutes can help prevent fainting, and injuries caused by a fall. Tell your doctor if you feel dizzy, or have vision changes or ringing in the ears.  Some people get severe pain in the shoulder and have difficulty moving the arm where a shot was given. This happens very rarely.  Any medication can cause a severe allergic reaction. Such reactions from a vaccine are very rare, estimated at about 1 in a million doses, and would happen within a few minutes to a few hours after the vaccination. As with any medicine, there is a very remote chance of a vaccine causing a serious injury or death. The safety of vaccines is always being monitored. For more information, visit: http://www.aguilar.org/ 5. What if there is a serious reaction? What should I look for? Look for anything that concerns you, such as signs of a severe allergic reaction, very high fever, or unusual behavior. Signs of a severe allergic reaction can include hives, swelling of the face and throat, difficulty breathing, a fast heartbeat, dizziness, and weakness. These would start a few minutes to a few hours  after the vaccination. What should I do?  If you think it is a severe allergic reaction or other emergency that can't wait, call 9-1-1 and get the person to the nearest hospital. Otherwise, call your doctor.  Reactions should be reported to the Vaccine Adverse Event Reporting System (VAERS). Your doctor should file this report, or you can do it yourself through the VAERS web site at www.vaers.SamedayNews.es, or by calling 478-131-1963.  VAERS does not give medical advice. 6. The National Vaccine Injury Compensation Program The [employer] Vaccine Injury Compensation Program (VICP) is a federal program that was created to compensate people who may have been injured by certain vaccines. Persons who believe they may have been injured by a vaccine can learn about the program and about filing a claim by calling (506)653-0125 or visiting the [employer] website at GoldCloset.com.ee. There is a time limit to file a claim for compensation. 7. How can I learn more?  Ask your healthcare provider. He or she can give you the vaccine package insert  or suggest other sources of information.  Call your local or state health department.  Contact the Centers for Disease Control and Prevention (CDC):  Call (860)238-2051 (1-800-CDC-INFO) or  Visit CDC's website at https://gibson.com/ Vaccine Information Statement, Inactivated Influenza Vaccine (08/25/2013) This information is not intended to replace advice given to you by your health care provider. Make sure you discuss any questions you have with your health care provider. Document Released: 10/30/2005 Document Revised: 09/26/2015 Document Reviewed: 09/26/2015 Elsevier Interactive Patient Education  2017 [employer].  These are the goals we discussed: Goals    . Reduce fast food intake       This is a list of the screening recommended for you and due dates:  Health Maintenance  Topic Date Due  . Tetanus Vaccine  07/04/2013  . Flu Shot  08/20/2015   . Hemoglobin A1C  02/02/2016  . Complete foot exam   08/01/2016  . Eye exam for diabetics  01/08/2017  . DEXA scan (bone density measurement)  11/22/2017  . Mammogram  01/01/2018  . Colon Cancer Screening  06/21/2021  . Shingles Vaccine  Completed  .  Hepatitis C: One time screening is recommended by Center for Disease Control  (CDC) for  adults born from [birth year] through 1964.   Completed  . Pneumonia vaccines  Completed

## 2016-02-26 ENCOUNTER — Ambulatory Visit (INDEPENDENT_AMBULATORY_CARE_PROVIDER_SITE_OTHER)
Admission: RE | Admit: 2016-02-26 | Discharge: 2016-02-26 | Disposition: A | Discharge status: Home / Self Care | Payer: PPO | Source: Ambulatory Visit | Attending: Internal Medicine | Admitting: Internal Medicine

## 2016-02-26 ENCOUNTER — Encounter: Payer: Self-pay | Admitting: Internal Medicine

## 2016-02-26 ENCOUNTER — Ambulatory Visit (INDEPENDENT_AMBULATORY_CARE_PROVIDER_SITE_OTHER): Payer: PPO | Admitting: Internal Medicine

## 2016-02-26 ENCOUNTER — Other Ambulatory Visit (INDEPENDENT_AMBULATORY_CARE_PROVIDER_SITE_OTHER): Payer: PPO

## 2016-02-26 VITALS — BP 120/76 | HR 83 | Temp 97.6°F | Resp 16 | Wt 196.0 lb

## 2016-02-26 DIAGNOSIS — Z780 Asymptomatic menopausal state: Secondary | ICD-10-CM

## 2016-02-26 DIAGNOSIS — Z0000 Encounter for general adult medical examination without abnormal findings: Secondary | ICD-10-CM | POA: Diagnosis not present

## 2016-02-26 DIAGNOSIS — R300 Dysuria: Secondary | ICD-10-CM | POA: Diagnosis not present

## 2016-02-26 DIAGNOSIS — K219 Gastro-esophageal reflux disease without esophagitis: Secondary | ICD-10-CM | POA: Diagnosis not present

## 2016-02-26 DIAGNOSIS — E785 Hyperlipidemia, unspecified: Secondary | ICD-10-CM

## 2016-02-26 DIAGNOSIS — E1140 Type 2 diabetes mellitus with diabetic neuropathy, unspecified: Secondary | ICD-10-CM

## 2016-02-26 DIAGNOSIS — I10 Essential (primary) hypertension: Secondary | ICD-10-CM

## 2016-02-26 LAB — CBC WITH DIFFERENTIAL/PLATELET
BASOS PCT: 1.1 % (ref 0.0–3.0)
Basophils Absolute: 0 10*3/uL (ref 0.0–0.1)
Eosinophils Absolute: 0 10*3/uL (ref 0.0–0.7)
Eosinophils Relative: 0.7 % (ref 0.0–5.0)
HCT: 38.3 % (ref 36.0–46.0)
Hemoglobin: 12.5 g/dL (ref 12.0–15.0)
LYMPHS PCT: 24.9 % (ref 12.0–46.0)
Lymphs Abs: 1.1 10*3/uL (ref 0.7–4.0)
MCHC: 32.7 g/dL (ref 30.0–36.0)
MCV: 91.5 fl (ref 78.0–100.0)
MONO ABS: 0.3 10*3/uL (ref 0.1–1.0)
Monocytes Relative: 7.5 % (ref 3.0–12.0)
Neutro Abs: 2.8 10*3/uL (ref 1.4–7.7)
Neutrophils Relative %: 65.8 % (ref 43.0–77.0)
Platelets: 245 10*3/uL (ref 150.0–400.0)
RBC: 4.19 Mil/uL (ref 3.87–5.11)
RDW: 13.6 % (ref 11.5–15.5)
WBC: 4.3 10*3/uL (ref 4.0–10.5)

## 2016-02-26 LAB — LIPID PANEL
CHOLESTEROL: 146 mg/dL (ref 0–200)
HDL: 59.6 mg/dL (ref >39.00)
LDL Cholesterol: 77 mg/dL (ref 0–99)
NonHDL: 86.32
TRIGLYCERIDES: 49 mg/dL (ref 0.0–149.0)
Total CHOL/HDL Ratio: 2
VLDL: 9.8 mg/dL (ref 0.0–40.0)

## 2016-02-26 LAB — URINALYSIS, ROUTINE W REFLEX MICROSCOPIC
Bilirubin Urine: NEGATIVE
Hgb urine dipstick: NEGATIVE
KETONES UR: NEGATIVE
Nitrite: NEGATIVE
RBC / HPF: NONE SEEN (ref 0–?)
Specific Gravity, Urine: 1.02 (ref 1.000–1.030)
Total Protein, Urine: NEGATIVE
URINE GLUCOSE: NEGATIVE
UROBILINOGEN UA: 0.2 (ref 0.0–1.0)
pH: 6.5 (ref 5.0–8.0)

## 2016-02-26 LAB — COMPREHENSIVE METABOLIC PANEL
ALBUMIN: 4.1 g/dL (ref 3.5–5.2)
ALT: 11 U/L (ref 0–35)
AST: 13 U/L (ref 0–37)
Alkaline Phosphatase: 55 U/L (ref 39–117)
BUN: 13 mg/dL (ref 6–23)
CALCIUM: 9.5 mg/dL (ref 8.4–10.5)
CO2: 29 meq/L (ref 19–32)
Chloride: 104 mEq/L (ref 96–112)
Creatinine, Ser: 0.89 mg/dL (ref 0.40–1.20)
GFR: 80.7 mL/min (ref >60.00)
Glucose, Bld: 97 mg/dL (ref 70–99)
POTASSIUM: 3.9 meq/L (ref 3.5–5.1)
Sodium: 141 mEq/L (ref 135–145)
Total Bilirubin: 0.4 mg/dL (ref 0.2–1.2)
Total Protein: 7.7 g/dL (ref 6.0–8.3)

## 2016-02-26 LAB — TSH: TSH: 2.31 u[IU]/mL (ref 0.35–4.50)

## 2016-02-26 LAB — HEMOGLOBIN A1C: HEMOGLOBIN A1C: 6.2 % (ref 4.6–6.5)

## 2016-02-26 NOTE — Progress Notes (Signed)
Subjective:    Patient ID: Angela Simmons, female    DOB: 09/28/1945, 71 y.o.   MRN: [id number]  HPI She is here for a physical exam.   Sugars have been less than 100 most of the time.  She is taking her medication daily.  She denies symptomatic hypoglycemia.  She is still on prednisone.   She has no concerns.   Medications and allergies reviewed with patient and updated if appropriate.  Patient Active Problem List   Diagnosis Date Noted  . High risk medication use 11/21/2015  . Sjogren's disease ([place]) 03/18/2015  . Inflammatory arthritis 03/18/2015  . ANA positive 12/02/2014  . Joint pain 11/22/2014  . Overweight (BMI 25.0-29.9) 08/18/2014  . Low back pain with radiation 08/18/2014  . Vulvar lesion 06/28/2014  . Hyperlipidemia with target LDL less than 100 06/28/2014  . Closed comedones , vulva,, multiple 04/27/2014  . Chest pain, atypical 04/11/2014  . Tendonitis of shoulder 08/29/2013  . S/P right mastectomy 11/14/2012  . Type 2 diabetes, controlled, with neuropathy ([place]) 10/05/2012  . Allergic rhinitis 01/26/2011  . GERD 11/10/2009  . INTERSTITIAL CYSTITIS 10/31/2008  . BENIGN NEOPLASM OF VULVA 02/01/2008  . FATIGUE 02/01/2008  . Essential hypertension, benign 02/08/2007  . ADENOCARCINOMA, BREAST, HX OF 02/08/2007    Current Outpatient Prescriptions on File Prior to Visit  Medication Sig Dispense Refill  . amLODipine (NORVASC) 10 MG tablet Take 1 tablet (10 mg total) by mouth daily. 90 tablet 2  . aspirin (ADULT ASPIRIN EC LOW STRENGTH) 81 MG EC tablet Take 81 mg by mouth daily.     . benazepril (LOTENSIN) 40 MG tablet Take 1 tablet (40 mg total) by mouth daily. 90 tablet 2  . cloNIDine (CATAPRES) 0.1 MG tablet TAKE 1 TABLET (0.1 MG TOTAL) BY MOUTH AT BEDTIME. 90 tablet 1  . fluticasone (FLONASE) 50 MCG/ACT nasal spray USE 2 SPRAYS IN EACH NOSTRIL EVERY DAY (Patient taking differently: USE 2 SPRAYS IN EACH NOSTRIL EVERY DAY PRN) 48 g 0  . glucose blood (FORA V30A  BLOOD GLUCOSE TEST) test strip Once daily testing 50 each 5  . hydroxychloroquine (PLAQUENIL) 200 MG tablet Take 1 tablet (200 mg total) by mouth 2 (two) times daily. 180 tablet 1  . Lancets (UNILET EXCELITE II) MISC TEST ONCE DAILY 100 each 0  . loratadine (CLARITIN) 10 MG tablet TAKE 1 TABLET BY MOUTH EVERY DAY (Patient taking differently: TAKE 1 TABLET BY MOUTH EVERY DAY PRN) 30 tablet 2  . lovastatin (MEVACOR) 40 MG tablet Take 1 tablet (40 mg total) by mouth daily. 90 tablet 1  . metFORMIN (GLUCOPHAGE) 500 MG tablet Take 1 tablet (500 mg total) by mouth daily with breakfast. 90 tablet 3  . Multiple Vitamin (MULTIVITAMIN) tablet Take 1 tablet by mouth daily.    . Omega-3 Fatty Acids (FISH OIL) 1000 MG CAPS Take 1 capsule by mouth daily.     . pantoprazole (PROTONIX) 40 MG tablet Take 1 tablet (40 mg total) by mouth daily. 90 tablet 2  . predniSONE (DELTASONE) 1 MG tablet 3 q am October,2 q am November, 1 q am December (with 5mg  daily to equal *mg, &mg, then 6 mg) (Patient taking differently: Take 1 mg by mouth daily. 3 q am October,2 q am November, 1 q am December (with 5mg  daily to equal *mg, &mg, then 6 mg)) 180 tablet 0  . ranitidine (ZANTAC) 150 MG tablet Take 1 tablet (150 mg total) by mouth at bedtime. 90 tablet  1  . calcium-vitamin D (OSCAL WITH D) 500-200 MG-UNIT per tablet Take 1 tablet by mouth daily with breakfast.     No current facility-administered medications on file prior to visit.     Past Medical History:  Diagnosis Date  . Breast cancer ([place]) 1990s   right breast  . Diabetes mellitus without complication ([place])   . GERD (gastroesophageal reflux disease)   . HTN (hypertension)   . Obesity     Past Surgical History:  Procedure Laterality Date  . ABDOMINAL HYSTERECTOMY     [place]  . COLONOSCOPY  May 2003   Dr. Tamala Julian: normal  . COLONOSCOPY  06/22/2011   Procedure: COLONOSCOPY;  Surgeon: Danie Binder, MD;  Location: [facility];  Service: Endoscopy;   Laterality: N/A;  10:30  . MASTECTOMY     right     Social History   Social History  . Marital status: Married    Spouse name: N/A  . Number of children: N/A  . Years of education: N/A   Social History Main Topics  . Smoking status: Never Smoker  . Smokeless tobacco: Never Used  . Alcohol use No  . Drug use: No  . Sexual activity: No   Other Topics Concern  . None   Social History Narrative  . None    Family History  Problem Relation Age of Onset  . Diabetes Mother   . Heart failure Mother   . Diabetes Father   . Emphysema Father   . Diabetes Brother   . Diabetes Sister   . Colon cancer Neg Hx     Review of Systems  Constitutional: Negative for appetite change, chills, fever and unexpected weight change.  Eyes: Negative for visual disturbance.  Respiratory: Positive for shortness of breath (mild). Negative for cough and wheezing.   Cardiovascular: Positive for leg swelling (chronic). Negative for chest pain and palpitations.  Gastrointestinal: Positive for abdominal pain (intermittent, lower abdomen, mild). Negative for blood in stool, constipation, diarrhea and nausea.       No gerd  Genitourinary: Positive for dysuria (occ). Negative for hematuria.  Musculoskeletal: Positive for arthralgias and back pain (intermittent, lower back).  Skin: Negative for color change and rash.  Neurological: Negative for light-headedness and headaches.  Psychiatric/Behavioral: Negative for dysphoric mood. The patient is not nervous/anxious.        Objective:   Vitals:   02/26/16 1001  BP: 120/76  Pulse: 83  Resp: 16  Temp: 97.6 F (36.4 C)   Filed Weights   02/26/16 1001  Weight: 196 lb (88.9 kg)   Body mass index is 28.94 kg/m.  Wt Readings from Last 3 Encounters:  02/26/16 196 lb (88.9 kg)  02/21/16 194 lb (88 kg)  08/02/15 188 lb (85.3 kg)     Physical Exam Constitutional: She appears well-developed and well-nourished. No distress.  HENT:  Head:  Normocephalic and atraumatic.  Right Ear: External ear normal. Normal ear canal and TM Left Ear: External ear normal.  Normal ear canal and TM Mouth/Throat: Oropharynx is clear and moist.  Eyes: Conjunctivae and EOM are normal.  Neck: Neck supple. No tracheal deviation present. No thyromegaly present.  No carotid bruit  Cardiovascular: Normal rate, regular rhythm and normal heart sounds.   No murmur heard.  No edema. Pulmonary/Chest: Effort normal and breath sounds normal. No respiratory distress. She has no wheezes. She has no rales.  Breast: deferred to Gyn Abdominal: Soft. She exhibits no distension. There is no tenderness.  Lymphadenopathy: She has no cervical adenopathy.  Skin: Skin is warm and dry. She is not diaphoretic.  Psychiatric: She has a normal mood and affect. Her behavior is normal.       Assessment & Plan:   Physical exam: Screening blood work  ordered Immunizations - tetanus due - deferred due to cost Colonoscopy  Up to date  Mammogram   Up to date  Gyn - no longer seeing Dexa - ordered Eye exams  Up to date  EKG - done 2017 Exercise - walking some - does more with better weather Weight - elevated BMI - advised weight loss Skin    No concerns Substance abuse    None  See Problem List for Assessment and Plan of chronic medical problems.   FU in 6 months

## 2016-02-26 NOTE — Progress Notes (Signed)
Pre visit review using our clinic review tool, if applicable. No additional management support is needed unless otherwise documented below in the visit note. 

## 2016-02-26 NOTE — Assessment & Plan Note (Signed)
Check a1c Continue metformin Monitor closely for hypoglycemia

## 2016-02-26 NOTE — Assessment & Plan Note (Signed)
Mild, intermittent dysuria Check UA, UCx

## 2016-02-26 NOTE — Assessment & Plan Note (Signed)
Check lipid panel  Continue daily statin Regular exercise and healthy diet encouraged  

## 2016-02-26 NOTE — Assessment & Plan Note (Signed)
BP well controlled Current regimen effective and well tolerated Continue current medications at current doses  

## 2016-02-26 NOTE — Assessment & Plan Note (Signed)
GERD controlled Continue daily medication - pantoprazole Can take zantac nightly or as needed

## 2016-02-26 NOTE — Patient Instructions (Addendum)
Test(s) ordered today. Your results will be released to MyChart (or called to you) after review, usually within 72hours after test completion. If any changes need to be made, you will be notified at that same time.  All other Health Maintenance issues reviewed.   All recommended immunizations and age-appropriate screenings are up-to-date or discussed.  No immunizations administered today.   Medications reviewed and updated.  Changes include  /  No changes recommended at this time.   Please followup in 6 months   Health Maintenance, Female Introduction Adopting a healthy lifestyle and getting preventive care can go a long way to promote health and wellness. Talk with your health care provider about what schedule of regular examinations is right for you. This is a good chance for you to check in with your provider about disease prevention and staying healthy. In between checkups, there are plenty of things you can do on your own. Experts have done a lot of research about which lifestyle changes and preventive measures are most likely to keep you healthy. Ask your health care provider for more information. Weight and diet Eat a healthy diet  Be sure to include plenty of vegetables, fruits, low-fat dairy products, and lean protein.  Do not eat a lot of foods high in solid fats, added sugars, or salt.  Get regular exercise. This is one of the most important things you can do for your health.  Most adults should exercise for at least 150 minutes each week. The exercise should increase your heart rate and make you sweat (moderate-intensity exercise).  Most adults should also do strengthening exercises at least twice a week. This is in addition to the moderate-intensity exercise. Maintain a healthy weight  Body mass index (BMI) is a measurement that can be used to identify possible weight problems. It estimates body fat based on height and weight. Your health care provider can help determine  your BMI and help you achieve or maintain a healthy weight.  For females 45 years of age and older:  A BMI below 18.5 is considered underweight.  A BMI of 18.5 to 24.9 is normal.  A BMI of 25 to 29.9 is considered overweight.  A BMI of 30 and above is considered obese. Watch levels of cholesterol and blood lipids  You should start having your blood tested for lipids and cholesterol at 70 years of age, then have this test every 5 years.  You may need to have your cholesterol levels checked more often if:  Your lipid or cholesterol levels are high.  You are older than 70 years of age.  You are at high risk for heart disease. Cancer screening Lung Cancer  Lung cancer screening is recommended for adults 66-22 years old who are at high risk for lung cancer because of a history of smoking.  A yearly low-dose CT scan of the lungs is recommended for people who:  Currently smoke.  Have quit within the past 15 years.  Have at least a 30-pack-year history of smoking. A pack year is smoking an average of one pack of cigarettes a day for 1 year.  Yearly screening should continue until it has been 15 years since you quit.  Yearly screening should stop if you develop a health problem that would prevent you from having lung cancer treatment. Breast Cancer  Practice breast self-awareness. This means understanding how your breasts normally appear and feel.  It also means doing regular breast self-exams. Let your health care provider know about  any changes, no matter how small.  If you are in your 20s or 30s, you should have a clinical breast exam (CBE) by a health care provider every 1-3 years as part of a regular health exam.  If you are 34 or older, have a CBE every year. Also consider having a breast X-ray (mammogram) every year.  If you have a family history of breast cancer, talk to your health care provider about genetic screening.  If you are at high risk for breast cancer,  talk to your health care provider about having an MRI and a mammogram every year.  Breast cancer gene (BRCA) assessment is recommended for women who have family members with BRCA-related cancers. BRCA-related cancers include:  Breast.  Ovarian.  Tubal.  Peritoneal cancers.  Results of the assessment will determine the need for genetic counseling and BRCA1 and BRCA2 testing. Cervical Cancer  Your health care provider may recommend that you be screened regularly for cancer of the pelvic organs (ovaries, uterus, and vagina). This screening involves a pelvic examination, including checking for microscopic changes to the surface of your cervix (Pap test). You may be encouraged to have this screening done every 3 years, beginning at age 50.  For women ages 49-65, health care providers may recommend pelvic exams and Pap testing every 3 years, or they may recommend the Pap and pelvic exam, combined with testing for human papilloma virus (HPV), every 5 years. Some types of HPV increase your risk of cervical cancer. Testing for HPV may also be done on women of any age with unclear Pap test results.  Other health care providers may not recommend any screening for nonpregnant women who are considered low risk for pelvic cancer and who do not have symptoms. Ask your health care provider if a screening pelvic exam is right for you.  If you have had past treatment for cervical cancer or a condition that could lead to cancer, you need Pap tests and screening for cancer for at least 20 years after your treatment. If Pap tests have been discontinued, your risk factors (such as having a new sexual partner) need to be reassessed to determine if screening should resume. Some women have medical problems that increase the chance of getting cervical cancer. In these cases, your health care provider may recommend more frequent screening and Pap tests. Colorectal Cancer  This type of cancer can be detected and often  prevented.  Routine colorectal cancer screening usually begins at 70 years of age and continues through 70 years of age.  Your health care provider may recommend screening at an earlier age if you have risk factors for colon cancer.  Your health care provider may also recommend using home test kits to check for hidden blood in the stool.  A small camera at the end of a tube can be used to examine your colon directly (sigmoidoscopy or colonoscopy). This is done to check for the earliest forms of colorectal cancer.  Routine screening usually begins at age 54.  Direct examination of the colon should be repeated every 5-10 years through 70 years of age. However, you may need to be screened more often if early forms of precancerous polyps or small growths are found. Skin Cancer  Check your skin from head to toe regularly.  Tell your health care provider about any new moles or changes in moles, especially if there is a change in a mole's shape or color.  Also tell your health care provider if you  have a mole that is larger than the size of a pencil eraser.  Always use sunscreen. Apply sunscreen liberally and repeatedly throughout the day.  Protect yourself by wearing long sleeves, pants, a wide-brimmed hat, and sunglasses whenever you are outside. Heart disease, diabetes, and high blood pressure  High blood pressure causes heart disease and increases the risk of stroke. High blood pressure is more likely to develop in:  People who have blood pressure in the high end of the normal range (130-139/85-89 mm Hg).  People who are overweight or obese.  People who are African American.  If you are 58-3 years of age, have your blood pressure checked every 3-5 years. If you are 59 years of age or older, have your blood pressure checked every year. You should have your blood pressure measured twice-once when you are at a hospital or clinic, and once when you are not at a hospital or clinic. Record  the average of the two measurements. To check your blood pressure when you are not at a hospital or clinic, you can use:  An automated blood pressure machine at a pharmacy.  A home blood pressure monitor.  If you are between 35 years and 55 years old, ask your health care provider if you should take aspirin to prevent strokes.  Have regular diabetes screenings. This involves taking a blood sample to check your fasting blood sugar level.  If you are at a normal weight and have a low risk for diabetes, have this test once every three years after 70 years of age.  If you are overweight and have a high risk for diabetes, consider being tested at a younger age or more often. Preventing infection Hepatitis B  If you have a higher risk for hepatitis B, you should be screened for this virus. You are considered at high risk for hepatitis B if:  You were born in a country where hepatitis B is common. Ask your health care provider which countries are considered high risk.  Your parents were born in a high-risk country, and you have not been immunized against hepatitis B (hepatitis B vaccine).  You have HIV or AIDS.  You use needles to inject street drugs.  You live with someone who has hepatitis B.  You have had sex with someone who has hepatitis B.  You get hemodialysis treatment.  You take certain medicines for conditions, including cancer, organ transplantation, and autoimmune conditions. Hepatitis C  Blood testing is recommended for:  Everyone born from [birth year] through 1964.  Anyone with known risk factors for hepatitis C. Sexually transmitted infections (STIs)  You should be screened for sexually transmitted infections (STIs) including gonorrhea and chlamydia if:  You are sexually active and are younger than 70 years of age.  You are older than 70 years of age and your health care provider tells you that you are at risk for this type of infection.  Your sexual activity has  changed since you were last screened and you are at an increased risk for chlamydia or gonorrhea. Ask your health care provider if you are at risk.  If you do not have HIV, but are at risk, it may be recommended that you take a prescription medicine daily to prevent HIV infection. This is called pre-exposure prophylaxis (PrEP). You are considered at risk if:  You are sexually active and do not regularly use condoms or know the HIV status of your partner(s).  You take drugs by injection.  You are  sexually active with a partner who has HIV. Talk with your health care provider about whether you are at high risk of being infected with HIV. If you choose to begin PrEP, you should first be tested for HIV. You should then be tested every 3 months for as long as you are taking PrEP. Pregnancy  If you are premenopausal and you may become pregnant, ask your health care provider about preconception counseling.  If you may become pregnant, take 400 to 800 micrograms (mcg) of folic acid every day.  If you want to prevent pregnancy, talk to your health care provider about birth control (contraception). Osteoporosis and menopause  Osteoporosis is a disease in which the bones lose minerals and strength with aging. This can result in serious bone fractures. Your risk for osteoporosis can be identified using a bone density scan.  If you are 65 years of age or older, or if you are at risk for osteoporosis and fractures, ask your health care provider if you should be screened.  Ask your health care provider whether you should take a calcium or vitamin D supplement to lower your risk for osteoporosis.  Menopause may have certain physical symptoms and risks.  Hormone replacement therapy may reduce some of these symptoms and risks. Talk to your health care provider about whether hormone replacement therapy is right for you. Follow these instructions at home:  Schedule regular health, dental, and eye  exams.  Stay current with your immunizations.  Do not use any tobacco products including cigarettes, chewing tobacco, or electronic cigarettes.  If you are pregnant, do not drink alcohol.  If you are breastfeeding, limit how much and how often you drink alcohol.  Limit alcohol intake to no more than 1 drink per day for nonpregnant women. One drink equals 12 ounces of beer, 5 ounces of wine, or 1 ounces of hard liquor.  Do not use street drugs.  Do not share needles.  Ask your health care provider for help if you need support or information about quitting drugs.  Tell your health care provider if you often feel depressed.  Tell your health care provider if you have ever been abused or do not feel safe at home. This information is not intended to replace advice given to you by your health care provider. Make sure you discuss any questions you have with your health care provider. Document Released: 07/21/2010 Document Revised: 06/13/2015 Document Reviewed: 10/09/2014  2017 [employer]

## 2016-02-27 LAB — MICROALBUMIN / CREATININE URINE RATIO
Creatinine,U: 171.2 mg/dL
MICROALB/CREAT RATIO: 0.6 mg/g (ref 0.0–30.0)
Microalb, Ur: 1.1 mg/dL (ref 0.0–1.9)

## 2016-02-28 LAB — URINE CULTURE: ORGANISM ID, BACTERIA: NO GROWTH

## 2016-03-04 ENCOUNTER — Encounter: Payer: Self-pay | Admitting: Internal Medicine

## 2016-03-04 DIAGNOSIS — M8580 Other specified disorders of bone density and structure, unspecified site: Secondary | ICD-10-CM | POA: Insufficient documentation

## 2016-03-17 ENCOUNTER — Telehealth: Payer: Self-pay | Admitting: Rheumatology

## 2016-03-17 NOTE — Telephone Encounter (Signed)
Patient's daughter called stating that her mother already had blood work done does she need to do more blood work before her March 12th appointment.  [phone] (daughter).  Thank you

## 2016-03-17 NOTE — Telephone Encounter (Signed)
Patient's family advised that she has had a CMP and CBC which is the standing orders we request.

## 2016-03-17 NOTE — Telephone Encounter (Signed)
Attempted to contact the patient and unable to leave message. Mailbox is full.  

## 2016-03-27 DIAGNOSIS — Z91199 Patient's noncompliance with other medical treatment and regimen due to unspecified reason: Secondary | ICD-10-CM | POA: Insufficient documentation

## 2016-03-27 DIAGNOSIS — Z9119 Patient's noncompliance with other medical treatment and regimen: Secondary | ICD-10-CM | POA: Insufficient documentation

## 2016-03-27 DIAGNOSIS — M25511 Pain in right shoulder: Secondary | ICD-10-CM | POA: Insufficient documentation

## 2016-03-27 DIAGNOSIS — Z86018 Personal history of other benign neoplasm: Secondary | ICD-10-CM | POA: Insufficient documentation

## 2016-03-27 DIAGNOSIS — M25512 Pain in left shoulder: Secondary | ICD-10-CM

## 2016-03-27 DIAGNOSIS — Z8639 Personal history of other endocrine, nutritional and metabolic disease: Secondary | ICD-10-CM | POA: Insufficient documentation

## 2016-03-27 DIAGNOSIS — M353 Polymyalgia rheumatica: Secondary | ICD-10-CM | POA: Insufficient documentation

## 2016-03-27 DIAGNOSIS — Z8739 Personal history of other diseases of the musculoskeletal system and connective tissue: Secondary | ICD-10-CM | POA: Insufficient documentation

## 2016-03-27 DIAGNOSIS — Z8679 Personal history of other diseases of the circulatory system: Secondary | ICD-10-CM | POA: Insufficient documentation

## 2016-03-27 NOTE — Progress Notes (Signed)
Office Visit Note  Patient: Angela Simmons             Date of Birth: Dec 14, 1945           MRN: [id number]             PCP: Binnie Rail, MD Referring: Binnie Rail, MD Visit Date: 03/30/2016 Occupation: @GUAROCC @    Subjective:  Follow-up Polymyalgia rheumatica and high-risk prescription and autoimmune disease  History of Present Illness: Angela Simmons is a 71 y.o. female   Patient was last seen in our office on 10/11/2015. She was supposed to return in December 2017 but she failed to keep that appointment. Were trying to taper off of her prednisone. Please see September 2017 notes for full details. She was supposed to do 8 mg in September, 7 mg of November, 6 mg in December and then she was supposed to follow [place] back up in December so we can taper her appropriately. I told her to stay on 5 mg after December.  Unfortunately, he should continued her taper at 1 mg per month. She states that currently she is on 2 mg in the month of March.  Overall she is doing well. No arthralgia, ongoing sicca symptoms. No joint pain stiffness and swelling.  She needs refill on her Plaquenil and she needs refill of her prednisone She states that she has plenty of 5 mg prednisone at home. I advised her not to take anymore 5 mg at this time.   Activities of Daily Living:  Patient reports morning stiffness for 30 minutes.   Patient Denies nocturnal pain.  Difficulty dressing/grooming: Denies Difficulty climbing stairs: Reports Difficulty getting out of chair: Denies Difficulty using hands for taps, buttons, cutlery, and/or writing: Denies   Review of Systems  Constitutional: Negative for fatigue.  HENT: Negative for mouth sores and mouth dryness.   Eyes: Negative for dryness.  Respiratory: Negative for shortness of breath.   Gastrointestinal: Negative for constipation and diarrhea.  Musculoskeletal: Negative for myalgias and myalgias.  Skin: Negative for sensitivity to sunlight.    Psychiatric/Behavioral: Negative for decreased concentration and sleep disturbance.    PMFS History:  Patient Active Problem List   Diagnosis Date Noted  . PMR (polymyalgia rheumatica) (HCC) 03/27/2016  . History of arthritis 03/27/2016  . History of noncompliance with medical treatment 03/27/2016  . Acute pain of both shoulders 03/27/2016  . History of back pain 03/27/2016  . History of hypertension 03/27/2016  . History of obesity 03/27/2016  . History of lipoma 03/27/2016  . Osteopenia 03/04/2016  . Dysuria 02/26/2016  . High risk medication use 11/21/2015  . Autoimmune disease ([place]) 03/18/2015  . Inflammatory arthritis 03/18/2015  . ANA positive 12/02/2014  . Joint pain 11/22/2014  . Overweight (BMI 25.0-29.9) 08/18/2014  . Low back pain with radiation 08/18/2014  . Vulvar lesion 06/28/2014  . Hyperlipidemia with target LDL less than 100 06/28/2014  . Closed comedones , vulva,, multiple 04/27/2014  . Chest pain, atypical 04/11/2014  . Tendonitis of shoulder 08/29/2013  . S/P right mastectomy 11/14/2012  . Type 2 diabetes, controlled, with neuropathy ([place]) 10/05/2012  . Allergic rhinitis 01/26/2011  . GERD 11/10/2009  . INTERSTITIAL CYSTITIS 10/31/2008  . BENIGN NEOPLASM OF VULVA 02/01/2008  . FATIGUE 02/01/2008  . Essential hypertension, benign 02/08/2007  . History of cancer of right breast 02/08/2007    Past Medical History:  Diagnosis Date  . Breast cancer ([place]) 1990s   right breast  .  Diabetes mellitus without complication ([place])   . GERD (gastroesophageal reflux disease)   . HTN (hypertension)   . Obesity     Family History  Problem Relation Age of Onset  . Diabetes Mother   . Heart failure Mother   . Diabetes Father   . Emphysema Father   . Diabetes Brother   . Diabetes Sister   . Colon cancer Neg Hx    Past Surgical History:  Procedure Laterality Date  . ABDOMINAL HYSTERECTOMY     [place], fibroids  . COLONOSCOPY  May 2003   Dr. Tamala Julian: normal  .  COLONOSCOPY  06/22/2011   Procedure: COLONOSCOPY;  Surgeon: Danie Binder, MD;  Location: [facility];  Service: Endoscopy;  Laterality: N/A;  10:30  . MASTECTOMY     right    Social History   Social History Narrative  . No narrative on file     Objective: Vital Signs: BP (!) 155/93   Pulse 88   Resp 12   Ht 5\' 9"  (1.753 m)   Wt 195 lb (88.5 kg)   BMI 28.80 kg/m    Physical Exam   Musculoskeletal Exam:  Full range of motion of all joints Grip strength is equal and strong bilaterally Fibromomyalgia tender points are all absent  CDAI Exam: No CDAI exam completed.  No synovitis on examination  Investigation: Findings:  Labs from July 11, 2015 show CK is normal at 79.  CMP with GFR normal.  CBC with diff is normal.  Sed rate is elevated at 51 and was 47 on her April 2017 visit.  As a result, I will go ahead and re-draw some of this blood work today to see how she is progressing.   Plaquenil eye exam is normal-02/05/2015    Patient's last Plaquenil eye exam was generally 17 2017 and she is due for repeat exam now after given her Plaquenil eye exam form  Appointment on 02/26/2016  Component Date Value Ref Range Status  . WBC 02/26/2016 4.3  4.0 - 10.5 K/uL Final  . RBC 02/26/2016 4.19  3.87 - 5.11 Mil/uL Final  . Hemoglobin 02/26/2016 12.5  12.0 - 15.0 g/dL Final  . HCT 02/26/2016 38.3  36.0 - 46.0 % Final  . MCV 02/26/2016 91.5  78.0 - 100.0 fl Final  . MCHC 02/26/2016 32.7  30.0 - 36.0 g/dL Final  . RDW 02/26/2016 13.6  11.5 - 15.5 % Final  . Platelets 02/26/2016 245.0  150.0 - 400.0 K/uL Final  . Neutrophils Relative % 02/26/2016 65.8  43.0 - 77.0 % Final  . Lymphocytes Relative 02/26/2016 24.9  12.0 - 46.0 % Final  . Monocytes Relative 02/26/2016 7.5  3.0 - 12.0 % Final  . Eosinophils Relative 02/26/2016 0.7  0.0 - 5.0 % Final  . Basophils Relative 02/26/2016 1.1  0.0 - 3.0 % Final  . Neutro Abs 02/26/2016 2.8  1.4 - 7.7 K/uL Final  . Lymphs Abs 02/26/2016 1.1   0.7 - 4.0 K/uL Final  . Monocytes Absolute 02/26/2016 0.3  0.1 - 1.0 K/uL Final  . Eosinophils Absolute 02/26/2016 0.0  0.0 - 0.7 K/uL Final  . Basophils Absolute 02/26/2016 0.0  0.0 - 0.1 K/uL Final  . Sodium 02/26/2016 141  135 - 145 mEq/L Final  . Potassium 02/26/2016 3.9  3.5 - 5.1 mEq/L Final  . Chloride 02/26/2016 104  96 - 112 mEq/L Final  . CO2 02/26/2016 29  19 - 32 mEq/L Final  . Glucose, Bld 02/26/2016  97  70 - 99 mg/dL Final  . BUN 02/26/2016 13  6 - 23 mg/dL Final  . Creatinine, Ser 02/26/2016 0.89  0.40 - 1.20 mg/dL Final  . Total Bilirubin 02/26/2016 0.4  0.2 - 1.2 mg/dL Final  . Alkaline Phosphatase 02/26/2016 55  39 - 117 U/L Final  . AST 02/26/2016 13  0 - 37 U/L Final  . ALT 02/26/2016 11  0 - 35 U/L Final  . Total Protein 02/26/2016 7.7  6.0 - 8.3 g/dL Final  . Albumin 02/26/2016 4.1  3.5 - 5.2 g/dL Final  . Calcium 02/26/2016 9.5  8.4 - 10.5 mg/dL Final  . GFR 02/26/2016 80.70  >60.00 mL/min Final  . Hgb A1c MFr Bld 02/26/2016 6.2  4.6 - 6.5 % Final  . Cholesterol 02/26/2016 146  0 - 200 mg/dL Final  . Triglycerides 02/26/2016 49.0  0.0 - 149.0 mg/dL Final  . HDL 02/26/2016 59.60  >39.00 mg/dL Final  . VLDL 02/26/2016 9.8  0.0 - 40.0 mg/dL Final  . LDL Cholesterol 02/26/2016 77  0 - 99 mg/dL Final  . Total CHOL/HDL Ratio 02/26/2016 2   Final  . NonHDL 02/26/2016 86.32   Final  . TSH 02/26/2016 2.31  0.35 - 4.50 uIU/mL Final  . Microalb, Ur 02/26/2016 1.1  0.0 - 1.9 mg/dL Final  . Creatinine,U 02/26/2016 171.2  mg/dL Final  . Microalb Creat Ratio 02/26/2016 0.6  0.0 - 30.0 mg/g Final  . Organism ID, Bacteria 02/26/2016 NO GROWTH   Final  . Color, Urine 02/26/2016 YELLOW  Yellow;Lt. Yellow Final  . APPearance 02/26/2016 Sl Cloudy* Clear Final  . Specific Gravity, Urine 02/26/2016 1.020  1.000 - 1.030 Final  . pH 02/26/2016 6.5  5.0 - 8.0 Final  . Total Protein, Urine 02/26/2016 NEGATIVE  Negative Final  . Urine Glucose 02/26/2016 NEGATIVE  Negative Final  .  Ketones, ur 02/26/2016 NEGATIVE  Negative Final  . Bilirubin Urine 02/26/2016 NEGATIVE  Negative Final  . Hgb urine dipstick 02/26/2016 NEGATIVE  Negative Final  . Urobilinogen, UA 02/26/2016 0.2  0.0 - 1.0 Final  . Leukocytes, UA 02/26/2016 SMALL* Negative Final  . Nitrite 02/26/2016 NEGATIVE  Negative Final  . WBC, UA 02/26/2016 7-10/hpf* 0-2/hpf Final  . RBC / HPF 02/26/2016 none seen  0-2/hpf Final  . Squamous Epithelial / LPF 02/26/2016 Many(>10/hpf)* Rare(0-4/hpf) Final  Documentation on 02/04/2016  Component Date Value Ref Range Status  . HM Diabetic Eye Exam 01/09/2016 No Retinopathy  No Retinopathy Final  Documentation on 10/17/2015  Component Date Value Ref Range Status  . Hemoglobin 10/11/2015 12.1  12.0 - 16.0 g/dL Final  . HCT 10/11/2015 37  36 - 46 % Final  . Platelets 10/11/2015 261  150 - 399 K/L Final  . WBC 10/11/2015 5.7  10^3/mL Final  . Glucose 10/11/2015 88  mg/dL Final  . BUN 10/11/2015 13  4 - 21 mg/dL Final  . Creatinine 10/11/2015 0.9  0.5 - 1.1 mg/dL Final  . Potassium 10/11/2015 4.2  3.4 - 5.3 mmol/L Final  . Sodium 10/11/2015 141  137 - 147 mmol/L Final  . Alkaline Phosphatase 10/11/2015 46  25 - 125 U/L Final  . ALT 10/11/2015 10  7 - 35 U/L Final  . AST 10/11/2015 13  13 - 35 U/L Final  . Bilirubin, Total 10/11/2015 0.4  mg/dL Final  . Sed Rate 10/11/2015 9  mm Final      Imaging: No results found.  Speciality Comments: No specialty comments available.  Procedures:  No procedures performed Allergies: Levofloxacin; Nitrofurantoin; Penicillins; and Sulfonamide derivatives   Assessment / Plan:     Visit Diagnoses: PMR (polymyalgia rheumatica) ([place]) - Plan: Sedimentation rate  Autoimmune disease ([place]) - +ANA,Smith and RoHistory of positive RNP, Rheum. Factor, sicca symptoms - Plan: C3 and C4  History of arthritis  High risk medication use - PLQ-200mg  b.i.d.   - Plan: Sedimentation rate, C3 and C4, Urinalysis, Routine w reflex  microscopic, CK  History of noncompliance with medical treatment  Acute pain of both shoulders  History of back pain  History of hypertension  History of obesity  History of cancer of right breast   Plan: #1: PMR. Well-controlled. No flare. Using the medication as prescribed except for prednisone. Patient tapered herself by 1 mg per month. She was a no-show for her December 2017 visit. At that time we were going to evaluate how well she was responding to her prednisone taper  #2: Autoimmune disease. History of positive ANA, positive Smith, positive Ro, elevated sedimentation rate, positive RNP, positive rheumatoid factor Ongoing sicca symptoms. History of arthralgia. No flare.  #3: History of noncompliance with office visits and medications in the past and even recently on December 2017  #4: Refill Plaquenil 20 mg twice a day 90 day supply with 1 refill  #5: I discussed with the patient to continue prednisone 1 mg every day starting April until she follows up with [place] in 3 months  #6: The following labs were not done: Sedimentation rate, C3-C4, urinalysis, CK. I will order those labs today in office   Orders: Orders Placed This Encounter  Procedures  . Sedimentation rate  . C3 and C4  . Urinalysis, Routine w reflex microscopic  . CK   No orders of the defined types were placed in this encounter.   Face-to-face time spent with patient was 30 minutes. 50% of time was spent in counseling and coordination of care.  Follow-Up Instructions: Return in about 3 months (around 06/30/2016) for PMR, plq, prednisone.   Eliezer Lofts, PA-C  Note - This record has been created using [employer].  Chart creation errors have been sought, but may not always  have been located. Such creation errors do not reflect on  the standard of medical care.

## 2016-03-30 ENCOUNTER — Encounter: Payer: Self-pay | Admitting: Rheumatology

## 2016-03-30 ENCOUNTER — Ambulatory Visit (INDEPENDENT_AMBULATORY_CARE_PROVIDER_SITE_OTHER): Payer: PPO | Admitting: Rheumatology

## 2016-03-30 VITALS — BP 155/93 | HR 88 | Resp 12 | Ht 69.0 in | Wt 195.0 lb

## 2016-03-30 DIAGNOSIS — M25512 Pain in left shoulder: Secondary | ICD-10-CM | POA: Diagnosis not present

## 2016-03-30 DIAGNOSIS — M353 Polymyalgia rheumatica: Secondary | ICD-10-CM | POA: Diagnosis not present

## 2016-03-30 DIAGNOSIS — Z8739 Personal history of other diseases of the musculoskeletal system and connective tissue: Secondary | ICD-10-CM | POA: Diagnosis not present

## 2016-03-30 DIAGNOSIS — Z853 Personal history of malignant neoplasm of breast: Secondary | ICD-10-CM | POA: Diagnosis not present

## 2016-03-30 DIAGNOSIS — Z9119 Patient's noncompliance with other medical treatment and regimen: Secondary | ICD-10-CM | POA: Diagnosis not present

## 2016-03-30 DIAGNOSIS — Z79899 Other long term (current) drug therapy: Secondary | ICD-10-CM

## 2016-03-30 DIAGNOSIS — Z8639 Personal history of other endocrine, nutritional and metabolic disease: Secondary | ICD-10-CM | POA: Diagnosis not present

## 2016-03-30 DIAGNOSIS — M359 Systemic involvement of connective tissue, unspecified: Secondary | ICD-10-CM

## 2016-03-30 DIAGNOSIS — Z8679 Personal history of other diseases of the circulatory system: Secondary | ICD-10-CM

## 2016-03-30 DIAGNOSIS — M25511 Pain in right shoulder: Secondary | ICD-10-CM | POA: Diagnosis not present

## 2016-03-30 DIAGNOSIS — Z91199 Patient's noncompliance with other medical treatment and regimen due to unspecified reason: Secondary | ICD-10-CM

## 2016-03-30 MED ORDER — PREDNISONE 1 MG PO TABS: 1.0000 mg | ORAL_TABLET | Freq: Every day | ORAL | 0 refills | Status: AC

## 2016-03-30 MED ORDER — HYDROXYCHLOROQUINE SULFATE 200 MG PO TABS: 200.0000 mg | ORAL_TABLET | Freq: Two times a day (BID) | ORAL | 1 refills | Status: DC

## 2016-03-31 LAB — URINALYSIS, MICROSCOPIC ONLY
Bacteria, UA: NONE SEEN [HPF]
CASTS: NONE SEEN [LPF]
CRYSTALS: NONE SEEN [HPF]
Yeast: NONE SEEN [HPF]

## 2016-03-31 LAB — C3 AND C4
C3 COMPLEMENT: 142 mg/dL (ref 83–193)
C4 COMPLEMENT: 47 mg/dL (ref 15–57)

## 2016-03-31 LAB — URINALYSIS, ROUTINE W REFLEX MICROSCOPIC
Bilirubin Urine: NEGATIVE
Glucose, UA: NEGATIVE
HGB URINE DIPSTICK: NEGATIVE
Ketones, ur: NEGATIVE
NITRITE: NEGATIVE
PH: 6 (ref 5.0–8.0)
Protein, ur: NEGATIVE
Specific Gravity, Urine: 1.015 (ref 1.001–1.035)

## 2016-03-31 LAB — CK: Total CK: 118 U/L (ref 7–177)

## 2016-03-31 LAB — SEDIMENTATION RATE: Sed Rate: 22 mm/hr (ref 0–30)

## 2016-04-07 ENCOUNTER — Encounter: Payer: Self-pay | Admitting: Internal Medicine

## 2016-04-07 ENCOUNTER — Ambulatory Visit (INDEPENDENT_AMBULATORY_CARE_PROVIDER_SITE_OTHER): Payer: PPO | Admitting: Internal Medicine

## 2016-04-07 VITALS — BP 126/78 | HR 89 | Temp 97.7°F | Resp 16 | Wt 198.0 lb

## 2016-04-07 DIAGNOSIS — R600 Localized edema: Secondary | ICD-10-CM

## 2016-04-07 DIAGNOSIS — R208 Other disturbances of skin sensation: Secondary | ICD-10-CM

## 2016-04-07 DIAGNOSIS — E1140 Type 2 diabetes mellitus with diabetic neuropathy, unspecified: Secondary | ICD-10-CM | POA: Diagnosis not present

## 2016-04-07 DIAGNOSIS — R300 Dysuria: Secondary | ICD-10-CM

## 2016-04-07 LAB — POCT URINALYSIS DIPSTICK
Bilirubin, UA: NEGATIVE
Blood, UA: NEGATIVE
GLUCOSE UA: NEGATIVE
Ketones, UA: NEGATIVE
Leukocytes, UA: NEGATIVE
Nitrite, UA: NEGATIVE
Protein, UA: NEGATIVE
Spec Grav, UA: 1.02 (ref 1.030–1.035)
UROBILINOGEN UA: 1 (ref ?–2.0)
pH, UA: 6 (ref 5.0–8.0)

## 2016-04-07 MED ORDER — HYDROCHLOROTHIAZIDE 12.5 MG PO TABS: 12.5000 mg | ORAL_TABLET | ORAL | 1 refills | Status: DC

## 2016-04-07 MED ORDER — AMLODIPINE BESYLATE 5 MG PO TABS: 5.0000 mg | ORAL_TABLET | Freq: Every day | ORAL | 3 refills | Status: DC

## 2016-04-07 NOTE — Progress Notes (Signed)
Pre visit review using our clinic review tool, if applicable. No additional management support is needed unless otherwise documented below in the visit note. 

## 2016-04-07 NOTE — Patient Instructions (Addendum)
  Medications reviewed and updated.  Changes include:  Start hydrochlorothiazide 12. 5 mg daily and decreasing your amlodipine to 5 mg daily   Your prescription(s) have been submitted to your pharmacy. Please take as directed and contact our office if you believe you are having problem(s) with the medication(s).

## 2016-04-07 NOTE — Assessment & Plan Note (Signed)
b/l anterior lower legs Started one week ago No skin change or rash Just monitor for now If persists she will let me know

## 2016-04-07 NOTE — Assessment & Plan Note (Addendum)
Urine with no evidence of infection No current symptoms No treatment needed

## 2016-04-07 NOTE — Assessment & Plan Note (Signed)
Her edema is mild, but it does bother her Amlodipine may be contributing, excess salt intake may be contributing, lack of exercise maybe contributing Increase activity Decrease salt intake  Decrease amlodipine to 5 mg daily Start hctz 12. 5 mg daily

## 2016-04-07 NOTE — Progress Notes (Signed)
Subjective:    Patient ID: Angela Simmons, female    DOB: [birth year], 71 y.o.   MRN: [id number]  HPI She is following with rheumatology and saw them recently and they advised she follow up with me.    ? UTI: They checked her urine and they told her she may have an infection.  She has urinary urgency - this is more recent. She had dysuria a couple of days after she say rheumatology and it lasted a day or two.  She denies dysuria, discoloration, odor and blood in her urine now.   Leg burning sensation:  The front of her left and right leg she has itching and a burning sensation.  This started one week ago and is in just the front of her legs. She denies any rash or change in skin.    Leg swelling:  She has bilateral ankle swelling.  It is worse if she stands all day.  First thing in the morning she has no swelling.  She does not exercise regularly.  She is not compliant with a low sodium diet.   Diabetes: She is taking her medication daily as prescribed. She is compliant with a diabetic diet. She is not exercising regularly. She monitors her sugars and they have been running 70-94.     Medications and allergies reviewed with patient and updated if appropriate.  Patient Active Problem List   Diagnosis Date Noted  . PMR (polymyalgia rheumatica) (HCC) 03/27/2016  . History of arthritis 03/27/2016  . History of noncompliance with medical treatment 03/27/2016  . Acute pain of both shoulders 03/27/2016  . History of back pain 03/27/2016  . History of hypertension 03/27/2016  . History of obesity 03/27/2016  . History of lipoma 03/27/2016  . Osteopenia 03/04/2016  . Dysuria 02/26/2016  . High risk medication use 11/21/2015  . Autoimmune disease ([place]) 03/18/2015  . Inflammatory arthritis 03/18/2015  . ANA positive 12/02/2014  . Joint pain 11/22/2014  . Overweight (BMI 25.0-29.9) 08/18/2014  . Low back pain with radiation 08/18/2014  . Vulvar lesion 06/28/2014  . Hyperlipidemia with  target LDL less than 100 06/28/2014  . Closed comedones , vulva,, multiple 04/27/2014  . Chest pain, atypical 04/11/2014  . Tendonitis of shoulder 08/29/2013  . S/P right mastectomy 11/14/2012  . Type 2 diabetes, controlled, with neuropathy ([place]) 10/05/2012  . Allergic rhinitis 01/26/2011  . GERD 11/10/2009  . INTERSTITIAL CYSTITIS 10/31/2008  . BENIGN NEOPLASM OF VULVA 02/01/2008  . FATIGUE 02/01/2008  . Essential hypertension, benign 02/08/2007  . History of cancer of right breast 02/08/2007    Current Outpatient Prescriptions on File Prior to Visit  Medication Sig Dispense Refill  . amLODipine (NORVASC) 10 MG tablet Take 1 tablet (10 mg total) by mouth daily. 90 tablet 2  . aspirin (ADULT ASPIRIN EC LOW STRENGTH) 81 MG EC tablet Take 81 mg by mouth daily.     . benazepril (LOTENSIN) 40 MG tablet Take 1 tablet (40 mg total) by mouth daily. 90 tablet 2  . cloNIDine (CATAPRES) 0.1 MG tablet TAKE 1 TABLET (0.1 MG TOTAL) BY MOUTH AT BEDTIME. 90 tablet 1  . fluticasone (FLONASE) 50 MCG/ACT nasal spray USE 2 SPRAYS IN EACH NOSTRIL EVERY DAY (Patient taking differently: USE 2 SPRAYS IN EACH NOSTRIL EVERY DAY PRN) 48 g 0  . glucose blood (FORA V30A BLOOD GLUCOSE TEST) test strip Once daily testing 50 each 5  . hydroxychloroquine (PLAQUENIL) 200 MG tablet Take 1 tablet (200 mg total)  by mouth 2 (two) times daily. 180 tablet 1  . Lancets (UNILET EXCELITE II) MISC TEST ONCE DAILY 100 each 0  . loratadine (CLARITIN) 10 MG tablet TAKE 1 TABLET BY MOUTH EVERY DAY (Patient taking differently: TAKE 1 TABLET BY MOUTH EVERY DAY PRN) 30 tablet 2  . lovastatin (MEVACOR) 40 MG tablet Take 1 tablet (40 mg total) by mouth daily. 90 tablet 1  . metFORMIN (GLUCOPHAGE) 500 MG tablet Take 1 tablet (500 mg total) by mouth daily with breakfast. 90 tablet 3  . Multiple Vitamin (MULTIVITAMIN) tablet Take 1 tablet by mouth daily.    . Omega-3 Fatty Acids (FISH OIL) 1000 MG CAPS Take 1 capsule by mouth daily.     .  pantoprazole (PROTONIX) 40 MG tablet Take 1 tablet (40 mg total) by mouth daily. 90 tablet 2  . predniSONE (DELTASONE) 1 MG tablet Take 1 tablet (1 mg total) by mouth daily with breakfast. Start Prednisone 1mg  April and continue in May and June; 90 tablet 0  . ranitidine (ZANTAC) 150 MG tablet Take 1 tablet (150 mg total) by mouth at bedtime. 90 tablet 1  . calcium-vitamin D (OSCAL WITH D) 500-200 MG-UNIT per tablet Take 1 tablet by mouth daily with breakfast.     No current facility-administered medications on file prior to visit.     Past Medical History:  Diagnosis Date  . Breast cancer ([place]) 1990s   right breast  . Diabetes mellitus without complication ([place])   . GERD (gastroesophageal reflux disease)   . HTN (hypertension)   . Obesity     Past Surgical History:  Procedure Laterality Date  . ABDOMINAL HYSTERECTOMY     [place], fibroids  . COLONOSCOPY  May 2003   Dr. Tamala Julian: normal  . COLONOSCOPY  06/22/2011   Procedure: COLONOSCOPY;  Surgeon: Danie Binder, MD;  Location: [facility];  Service: Endoscopy;  Laterality: N/A;  10:30  . MASTECTOMY     right     Social History   Social History  . Marital status: Married    Spouse name: N/A  . Number of children: N/A  . Years of education: N/A   Social History Main Topics  . Smoking status: Never Smoker  . Smokeless tobacco: Never Used  . Alcohol use No  . Drug use: No  . Sexual activity: No   Other Topics Concern  . Not on file   Social History Narrative  . No narrative on file    Family History  Problem Relation Age of Onset  . Diabetes Mother   . Heart failure Mother   . Diabetes Father   . Emphysema Father   . Diabetes Brother   . Diabetes Sister   . Colon cancer Neg Hx     Review of Systems  Constitutional: Negative for chills and fever.  Respiratory: Positive for shortness of breath (sometimes).   Cardiovascular: Positive for leg swelling. Negative for chest pain and palpitations.  Gastrointestinal:  Negative for abdominal pain and nausea.  Genitourinary: Positive for frequency and urgency. Negative for dysuria (none now) and hematuria.  Neurological: Positive for headaches. Negative for light-headedness.       Objective:   Vitals:   04/07/16 1506  BP: 126/78  Pulse: 89  Resp: 16  Temp: 97.7 F (36.5 C)   Filed Weights   04/07/16 1506  Weight: 198 lb (89.8 kg)   Body mass index is 29.24 kg/m.  Wt Readings from Last 3 Encounters:  04/07/16 198 lb (  89.8 kg)  03/30/16 195 lb (88.5 kg)  02/26/16 196 lb (88.9 kg)     Physical Exam Constitutional: Appears well-developed and well-nourished. No distress.  HENT:  Head: Normocephalic and atraumatic.  Neck: Neck supple. No tracheal deviation present. No thyromegaly present.  No cervical lymphadenopathy Cardiovascular: Normal rate, regular rhythm and normal heart sounds.   No murmur heard. No carotid bruit .  trace edema Pulmonary/Chest: Effort normal and breath sounds normal. No respiratory distress. No has no wheezes. No rales.  Abdomen: soft,non tender, non distended Skin: Skin is warm and dry. Not diaphoretic. no rash or skin change on legs Psychiatric: Normal mood and affect. Behavior is normal.         Assessment & Plan:   See Problem List for Assessment and Plan of chronic medical problems.

## 2016-04-07 NOTE — Assessment & Plan Note (Signed)
Sugars 75-94 at home Continue metformin

## 2016-04-09 ENCOUNTER — Other Ambulatory Visit: Payer: Self-pay | Admitting: Emergency Medicine

## 2016-04-09 DIAGNOSIS — R51 Headache: Secondary | ICD-10-CM | POA: Diagnosis not present

## 2016-04-09 DIAGNOSIS — Z7952 Long term (current) use of systemic steroids: Secondary | ICD-10-CM | POA: Diagnosis not present

## 2016-04-09 DIAGNOSIS — Z7982 Long term (current) use of aspirin: Secondary | ICD-10-CM | POA: Diagnosis not present

## 2016-04-09 DIAGNOSIS — J40 Bronchitis, not specified as acute or chronic: Secondary | ICD-10-CM | POA: Diagnosis not present

## 2016-04-09 DIAGNOSIS — I10 Essential (primary) hypertension: Secondary | ICD-10-CM | POA: Diagnosis not present

## 2016-04-09 DIAGNOSIS — J209 Acute bronchitis, unspecified: Secondary | ICD-10-CM | POA: Diagnosis not present

## 2016-04-09 DIAGNOSIS — E7800 Pure hypercholesterolemia, unspecified: Secondary | ICD-10-CM | POA: Diagnosis not present

## 2016-04-09 DIAGNOSIS — Z79899 Other long term (current) drug therapy: Secondary | ICD-10-CM | POA: Diagnosis not present

## 2016-04-09 MED ORDER — BENAZEPRIL HCL 40 MG PO TABS: 40.0000 mg | ORAL_TABLET | Freq: Every day | ORAL | 1 refills | Status: DC

## 2016-04-09 MED ORDER — PANTOPRAZOLE SODIUM 40 MG PO TBEC: 40.0000 mg | DELAYED_RELEASE_TABLET | Freq: Every day | ORAL | 1 refills | Status: DC

## 2016-04-09 MED ORDER — LOVASTATIN 40 MG PO TABS: 40.0000 mg | ORAL_TABLET | Freq: Every day | ORAL | 1 refills | Status: DC

## 2016-04-09 MED ORDER — CLONIDINE HCL 0.1 MG PO TABS: ORAL_TABLET | ORAL | 1 refills | Status: DC

## 2016-04-09 NOTE — Progress Notes (Deleted)
Pre visit review using our clinic review tool, if applicable. No additional management support is needed unless otherwise documented below in the visit note. 

## 2016-04-10 ENCOUNTER — Telehealth: Payer: Self-pay | Admitting: Internal Medicine

## 2016-04-10 NOTE — Telephone Encounter (Signed)
Patient states she was advised to take her fluid pill and because she ran to the bathroom so much she stopped it.  She also states that she was to cut back on her amlodipine.  She came down with bronchitis.  Went to ED and now is back on regular dose.  Requesting call back in regard.

## 2016-04-10 NOTE — Telephone Encounter (Signed)
Please advise 

## 2016-04-11 NOTE — Telephone Encounter (Signed)
Ok to hold the water pill and keep amlodipine at 5 mg daily.  We just need to make sure her BP is controlled.   If she can not check it or does not have a doctor's appt soon to have it checked she should come in for a nurse visit for BP check.

## 2016-04-13 NOTE — Telephone Encounter (Signed)
Pt called back in inform that she was seen at [facility] ED after her office visit and was given an antibiotic for zpac. She has complete and feels dizzy. She does think it has helped but would like to know what should be done. Please advise.

## 2016-04-13 NOTE — Telephone Encounter (Signed)
zpak is a 10 day antibiotic even though it is only taken for 5 days, so depending on when she started it is still in her system.  If she is not feeling better she should be evaluated.

## 2016-04-13 NOTE — Telephone Encounter (Signed)
Spoke with pt to inform. Instructed to call and let us know if BPs were not within normal range. She states she does have a BP machine at home.

## 2016-04-14 NOTE — Telephone Encounter (Signed)
Spoke with pt, she is going to call tomorrow if she feels like she wants to come in for an appt.

## 2016-04-15 MED ORDER — DOXYCYCLINE HYCLATE 100 MG PO TABS: 100.0000 mg | ORAL_TABLET | Freq: Two times a day (BID) | ORAL | 0 refills | Status: DC

## 2016-04-15 NOTE — Telephone Encounter (Signed)
C/O: Cough- some yellow production, SOB, dizzy, light headed, headache, night sweat, some nasal congestion.   Please send to [facility].

## 2016-04-15 NOTE — Telephone Encounter (Signed)
sent 

## 2016-04-15 NOTE — Telephone Encounter (Signed)
Please advise if something can be sent in for pt since we do not have any appts with the holiday.

## 2016-04-15 NOTE — Telephone Encounter (Signed)
Patient called in needing an appointment, we do not have anything here today or with Dr.Burns. Patient only wants to see Burns. Lovena Le is going to follow up with patient. Thank you.

## 2016-04-15 NOTE — Addendum Note (Signed)
Addended by: Binnie Rail on: 04/15/2016 12:48 PM   Modules accepted: Orders

## 2016-04-15 NOTE — Telephone Encounter (Signed)
Ok, I can send an antibiotic in - what symptoms is she currently having?

## 2016-04-23 ENCOUNTER — Telehealth: Payer: Self-pay | Admitting: Internal Medicine

## 2016-04-23 NOTE — Telephone Encounter (Signed)
Pt called wanting to speak with you regarding some issues that she is still having. She said that she is coughing at night, very congested and has a scratchy throat. She wanted to know if there was anything that she could do or if she needs to make an appointment? She would like for you to call her back as soon as possible.

## 2016-04-23 NOTE — Telephone Encounter (Signed)
Spoke with pt to inform. Pt has not taken the antibiotic that Dr Quay Burow gave. Encouraged pt to take this and to take the mucinex as well.

## 2016-04-23 NOTE — Telephone Encounter (Signed)
Please advise 

## 2016-04-23 NOTE — Telephone Encounter (Signed)
She can try regular mucinex.  If no improvement she should be seen.  Let her know the office is open Saturday morning.

## 2016-05-26 DIAGNOSIS — L84 Corns and callosities: Secondary | ICD-10-CM | POA: Diagnosis not present

## 2016-05-26 DIAGNOSIS — M79673 Pain in unspecified foot: Secondary | ICD-10-CM | POA: Diagnosis not present

## 2016-05-26 DIAGNOSIS — M79675 Pain in left toe(s): Secondary | ICD-10-CM | POA: Diagnosis not present

## 2016-05-26 DIAGNOSIS — M79674 Pain in right toe(s): Secondary | ICD-10-CM | POA: Diagnosis not present

## 2016-05-26 DIAGNOSIS — E1142 Type 2 diabetes mellitus with diabetic polyneuropathy: Secondary | ICD-10-CM | POA: Diagnosis not present

## 2016-06-02 ENCOUNTER — Telehealth: Payer: Self-pay | Admitting: Rheumatology

## 2016-06-02 DIAGNOSIS — M2550 Pain in unspecified joint: Secondary | ICD-10-CM

## 2016-06-02 NOTE — Telephone Encounter (Signed)
Patient states she was last seen on 03/30/16. Patient states she is having pain and stiffness in her legs and her knees when trying to get out of the bed every morning and states it last all day. Patient states she occasionally has stiffness in her shoulders. Patient states she is on PLQ 200 mg BID and Prednisone 1 mg daily and taking both as prescribed. She would like to know what she can do.

## 2016-06-02 NOTE — Telephone Encounter (Signed)
Patient called to let Mr. Carlyon Shadow know that for about 3 months now she has had stiffness and pain with both legs. Patient states it is hard for her to move , and walk because of legs. Please call patient to discuss.

## 2016-06-02 NOTE — Telephone Encounter (Signed)
Patient should get repeat ESR. If ESR is high then we can increase her prednisone to 5 mg by mouth daily. If it's normal then she should take continue to take Tylenol.

## 2016-06-03 ENCOUNTER — Other Ambulatory Visit: Payer: Self-pay | Admitting: Radiology

## 2016-06-03 DIAGNOSIS — M2550 Pain in unspecified joint: Secondary | ICD-10-CM | POA: Diagnosis not present

## 2016-06-03 NOTE — Telephone Encounter (Signed)
Patient advised that she needs to come to the office to have her her sed rate drawn and once that results then I can advise her how we should proceed. Patient advised ok to take tylenol. Patient verbalized understanding. Patient will come for labs today.

## 2016-06-04 LAB — SEDIMENTATION RATE: Sed Rate: 14 mm/hr (ref 0–30)

## 2016-06-09 ENCOUNTER — Telehealth: Payer: Self-pay | Admitting: Rheumatology

## 2016-06-09 NOTE — Telephone Encounter (Signed)
Patient advised of lab results and recommendations and verbalized understanding.

## 2016-06-09 NOTE — Telephone Encounter (Signed)
Patient returning Andrea's call. °

## 2016-06-23 DIAGNOSIS — M3500 Sicca syndrome, unspecified: Secondary | ICD-10-CM | POA: Insufficient documentation

## 2016-06-25 NOTE — Progress Notes (Signed)
Office Visit Note  Patient: Angela Simmons             Date of Birth: [birth year]           MRN: [id number]             PCP: Binnie Rail, MD Referring: Binnie Rail, MD Visit Date: 06/30/2016 Occupation: @GUAROCC @    Subjective:  Pain knee joints.   History of Present Illness: Angela Simmons is a 71 y.o. female with history of autoimmune disease and polymyalgia. She states she's been having pain and discomfort in her bilateral knee joints and lower back. She also has a stiffness in her hands. She complains of discomfort in her shoulders. She complains of dry mouth at nighttime.  Activities of Daily Living:  Patient reports morning stiffness for 10 minutes.   Patient Denies nocturnal pain.  Difficulty dressing/grooming: Denies Difficulty climbing stairs: Reports Difficulty getting out of chair: Reports Difficulty using hands for taps, buttons, cutlery, and/or writing: Denies   Review of Systems  Constitutional: Positive for fatigue. Negative for night sweats, weight gain, weight loss and weakness.  HENT: Positive for mouth dryness. Negative for mouth sores, trouble swallowing, trouble swallowing and nose dryness.   Eyes: Positive for dryness. Negative for pain, redness and visual disturbance.  Respiratory: Negative for cough, shortness of breath and difficulty breathing.   Cardiovascular: Negative for chest pain, palpitations, hypertension, irregular heartbeat and swelling in legs/feet.  Gastrointestinal: Negative for blood in stool, constipation and diarrhea.  Endocrine: Negative for increased urination.  Genitourinary: Negative for vaginal dryness.  Musculoskeletal: Positive for arthralgias, joint pain, myalgias, morning stiffness and myalgias. Negative for joint swelling, muscle weakness and muscle tenderness.  Skin: Negative for color change, rash, hair loss, skin tightness, ulcers and sensitivity to sunlight.  Allergic/Immunologic: Negative for susceptible to  infections.  Neurological: Negative for dizziness, memory loss and night sweats.  Hematological: Negative for swollen glands.  Psychiatric/Behavioral: Negative for depressed mood and sleep disturbance. The patient is not nervous/anxious.     PMFS History:  Patient Active Problem List   Diagnosis Date Noted  . Sicca syndrome ([place]) 06/23/2016  . Bilateral leg edema 04/07/2016  . Burning sensation of skin 04/07/2016  . PMR (polymyalgia rheumatica) (HCC) 03/27/2016  . History of arthritis 03/27/2016  . History of noncompliance with medical treatment 03/27/2016  . Acute pain of both shoulders 03/27/2016  . History of back pain 03/27/2016  . History of hypertension 03/27/2016  . History of obesity 03/27/2016  . History of lipoma 03/27/2016  . Osteopenia 03/04/2016  . Dysuria 02/26/2016  . High risk medication use 11/21/2015  . Autoimmune disease ([place]) 03/18/2015  . Inflammatory arthritis 03/18/2015  . ANA positive 12/02/2014  . Joint pain 11/22/2014  . Overweight (BMI 25.0-29.9) 08/18/2014  . Low back pain with radiation 08/18/2014  . Vulvar lesion 06/28/2014  . Hyperlipidemia with target LDL less than 100 06/28/2014  . Closed comedones , vulva,, multiple 04/27/2014  . Chest pain, atypical 04/11/2014  . Tendonitis of shoulder 08/29/2013  . S/P right mastectomy 11/14/2012  . Type 2 diabetes, controlled, with neuropathy ([place]) 10/05/2012  . Allergic rhinitis 01/26/2011  . GERD 11/10/2009  . INTERSTITIAL CYSTITIS 10/31/2008  . BENIGN NEOPLASM OF VULVA 02/01/2008  . Other fatigue 02/01/2008  . Essential hypertension, benign 02/08/2007  . History of cancer of right breast 02/08/2007    Past Medical History:  Diagnosis Date  . Breast cancer ([place]) 1990s   right breast  .  Diabetes mellitus without complication ([place])   . GERD (gastroesophageal reflux disease)   . HTN (hypertension)   . Obesity     Family History  Problem Relation Age of Onset  . Diabetes Mother   . Heart  failure Mother   . Diabetes Father   . Emphysema Father   . Diabetes Brother   . Diabetes Sister   . Colon cancer Neg Hx    Past Surgical History:  Procedure Laterality Date  . ABDOMINAL HYSTERECTOMY     [place], fibroids  . COLONOSCOPY  May 2003   Dr. Tamala Julian: normal  . COLONOSCOPY  06/22/2011   Procedure: COLONOSCOPY;  Surgeon: Danie Binder, MD;  [facility];  Service: Endoscopy;  Laterality: N/A;  10:30  . MASTECTOMY     right    Social History   Social History Narrative  . No narrative on file     Objective: Vital Signs: BP (!) 180/91 (BP Location: Left Arm, Patient Position: Sitting, Cuff Size: Large)   Pulse 82   Resp 14   Wt 197 lb (89.4 kg)   BMI 29.09 kg/m    Physical Exam  Constitutional: She is oriented to person, place, and time. She appears well-developed and well-nourished.  HENT:  Head: Normocephalic and atraumatic.  Eyes: Conjunctivae and EOM are normal.  Neck: Normal range of motion.  Cardiovascular: Normal rate, regular rhythm, normal heart sounds and intact distal pulses.   Pulmonary/Chest: Effort normal and breath sounds normal.  Abdominal: Soft. Bowel sounds are normal.  Lymphadenopathy:    She has no cervical adenopathy.  Neurological: She is alert and oriented to person, place, and time.  Skin: Skin is warm and dry. Capillary refill takes less than 2 seconds.  Psychiatric: She has a normal mood and affect. Her behavior is normal.  Nursing note and vitals reviewed.    Musculoskeletal Exam: C-spine and thoracic lumbar spine good range of motion. Shoulder joints although joints wrist joints are good range of motion. She is no synovitis on examination. She is some DIP PIP thickening in her hands. Hip joints knee joints ankles MTPs PIPs DIPs are good range of motion with no synovitis.  CDAI Exam: No CDAI exam completed.    Investigation: Findings:  Patient had Eye exam in Dec 2017 but Dr Idolina Primer did not list Plaquenil as a medication  there is not a field of vision. 02/05/2015 was last field of vision/ normal    Labs from July 11, 2015 show CK is normal at 79.  CMP with GFR normal.  CBC with diff is normal.  Sed rate is elevated at 51 and was 47 on her April 2017 visit.   06/03/2016 Sed Rate 14   CBC Latest Ref Rng & Units 02/26/2016 10/11/2015 08/02/2015  WBC 4.0 - 10.5 K/uL 4.3 5.7 7.2  Hemoglobin 12.0 - 15.0 g/dL 12.5 12.1 12.2  Hematocrit 36.0 - 46.0 % 38.3 37 37.2  Platelets 150.0 - 400.0 K/uL 245.0 261 239.0    CMP Latest Ref Rng & Units 02/26/2016 10/11/2015 08/02/2015  Glucose 70 - 99 mg/dL 97 - 114(H)  BUN 6 - 23 mg/dL 13 13 19   Creatinine 0.40 - 1.20 mg/dL 0.89 0.9 0.99  Sodium 135 - 145 mEq/L 141 141 139  Potassium 3.5 - 5.1 mEq/L 3.9 4.2 4.1  Chloride 96 - 112 mEq/L 104 - 102  CO2 19 - 32 mEq/L 29 - 27  Calcium 8.4 - 10.5 mg/dL 9.5 - 9.5  Total Protein 6.0 -  8.3 g/dL 7.7 - 7.7  Total Bilirubin 0.2 - 1.2 mg/dL 0.4 - 0.4  Alkaline Phos 39 - 117 U/L 55 46 54  AST 0 - 37 U/L 13 13 11   ALT 0 - 35 U/L 11 10 13        Imaging: No results found.  Speciality Comments: No specialty comments available.    Procedures:  No procedures performed Allergies: Levofloxacin; Nitrofurantoin; Penicillins; and Sulfonamide derivatives   Assessment / Plan:     Visit Diagnoses: PMR (polymyalgia rheumatica) [facility]): Patient has no muscular weakness or tenderness on examination she is on prednisone on milligrams by mouth daily now she supposed to taper by 1 mg every other day and then come off the medication.  Autoimmune disease ([place]) - Positive ANA, sicca symptoms: Her sicca symptoms are fairly well controlled she has no other features of autoimmune disease currently. She is no synovitis on examination.  Sicca syndrome [facility]): She is using over-the-counter products.  High risk medication use - Plaquenil 200 mg by mouth twice a day, prednisone on milligrams by mouth daily - Plan: CBC with Differential/Platelet, COMPLETE  METABOLIC PANEL WITH GFR. Her labs and eye exams are due.   Acute pain of both shoulders: Chronic discomfort  Tendinitis of left shoulder  Low back pain : She continues to some discomfort in her lower back  Other fatigue  Osteopenia of multiple sites: Use of calcium and vitamin D and resistive exercises were discussed.  Her other medical problems are listed as follows  History of hypertension  History of obesity  History of cancer of right breast  History of hyperlipidemia  History of cystitis  Overweight (BMI 25.0-29.9)  History of gastroesophageal reflux (GERD)    Orders: Orders Placed This Encounter  Procedures  . CBC with Differential/Platelet  . COMPLETE METABOLIC PANEL WITH GFR   No orders of the defined types were placed in this encounter.   Face-to-face time spent with patient was 30 minutes. 50% of time was spent in counseling and coordination of care.  Follow-Up Instructions: Return in about 6 months (around 12/30/2016) for Autoimmune disease, polymyalgia rheumatica.   Bo Merino, MD  Note - This record has been created using Editor, commissioning.  Chart creation errors have been sought, but may not always  have been located. Such creation errors do not reflect on  the standard of medical care.

## 2016-06-30 ENCOUNTER — Ambulatory Visit (INDEPENDENT_AMBULATORY_CARE_PROVIDER_SITE_OTHER): Payer: PPO | Admitting: Rheumatology

## 2016-06-30 ENCOUNTER — Encounter: Payer: Self-pay | Admitting: Rheumatology

## 2016-06-30 VITALS — BP 180/91 | HR 82 | Resp 14 | Wt 197.0 lb

## 2016-06-30 DIAGNOSIS — Z79899 Other long term (current) drug therapy: Secondary | ICD-10-CM | POA: Diagnosis not present

## 2016-06-30 DIAGNOSIS — E663 Overweight: Secondary | ICD-10-CM

## 2016-06-30 DIAGNOSIS — M778 Other enthesopathies, not elsewhere classified: Secondary | ICD-10-CM

## 2016-06-30 DIAGNOSIS — Z8639 Personal history of other endocrine, nutritional and metabolic disease: Secondary | ICD-10-CM

## 2016-06-30 DIAGNOSIS — D8989 Other specified disorders involving the immune mechanism, not elsewhere classified: Secondary | ICD-10-CM | POA: Diagnosis not present

## 2016-06-30 DIAGNOSIS — M8589 Other specified disorders of bone density and structure, multiple sites: Secondary | ICD-10-CM

## 2016-06-30 DIAGNOSIS — Z853 Personal history of malignant neoplasm of breast: Secondary | ICD-10-CM

## 2016-06-30 DIAGNOSIS — M3500 Sicca syndrome, unspecified: Secondary | ICD-10-CM | POA: Diagnosis not present

## 2016-06-30 DIAGNOSIS — Z9119 Patient's noncompliance with other medical treatment and regimen: Secondary | ICD-10-CM | POA: Diagnosis not present

## 2016-06-30 DIAGNOSIS — Z8719 Personal history of other diseases of the digestive system: Secondary | ICD-10-CM

## 2016-06-30 DIAGNOSIS — M5440 Lumbago with sciatica, unspecified side: Secondary | ICD-10-CM

## 2016-06-30 DIAGNOSIS — M545 Low back pain, unspecified: Secondary | ICD-10-CM

## 2016-06-30 DIAGNOSIS — Z8679 Personal history of other diseases of the circulatory system: Secondary | ICD-10-CM | POA: Diagnosis not present

## 2016-06-30 DIAGNOSIS — M359 Systemic involvement of connective tissue, unspecified: Secondary | ICD-10-CM

## 2016-06-30 DIAGNOSIS — R5383 Other fatigue: Secondary | ICD-10-CM

## 2016-06-30 DIAGNOSIS — M25512 Pain in left shoulder: Secondary | ICD-10-CM

## 2016-06-30 DIAGNOSIS — M25511 Pain in right shoulder: Secondary | ICD-10-CM

## 2016-06-30 DIAGNOSIS — M7582 Other shoulder lesions, left shoulder: Secondary | ICD-10-CM

## 2016-06-30 DIAGNOSIS — M353 Polymyalgia rheumatica: Secondary | ICD-10-CM

## 2016-06-30 DIAGNOSIS — Z91199 Patient's noncompliance with other medical treatment and regimen due to unspecified reason: Secondary | ICD-10-CM

## 2016-06-30 DIAGNOSIS — Z87440 Personal history of urinary (tract) infections: Secondary | ICD-10-CM

## 2016-06-30 LAB — CBC WITH DIFFERENTIAL/PLATELET
Basophils Absolute: 42 cells/uL (ref 0–200)
Basophils Relative: 1 %
Eosinophils Absolute: 0 cells/uL — ABNORMAL LOW (ref 15–500)
Eosinophils Relative: 0 %
HCT: 37.4 % (ref 35.0–45.0)
HEMOGLOBIN: 12.3 g/dL (ref 11.7–15.5)
LYMPHS ABS: 882 {cells}/uL (ref 850–3900)
Lymphocytes Relative: 21 %
MCH: 29.6 pg (ref 27.0–33.0)
MCHC: 32.9 g/dL (ref 32.0–36.0)
MCV: 90.1 fL (ref 80.0–100.0)
MONOS PCT: 7 %
MPV: 10.6 fL (ref 7.5–12.5)
Monocytes Absolute: 294 cells/uL (ref 200–950)
NEUTROS ABS: 2982 {cells}/uL (ref 1500–7800)
Neutrophils Relative %: 71 %
PLATELETS: 269 10*3/uL (ref 140–400)
RBC: 4.15 MIL/uL (ref 3.80–5.10)
RDW: 14.5 % (ref 11.0–15.0)
WBC: 4.2 10*3/uL (ref 3.8–10.8)

## 2016-06-30 NOTE — Patient Instructions (Signed)
Natural anti-inflammatories  You can purchase these at [employer], [employer] or online.  . Turmeric (capsules)  . Ginger (ginger root or capsules)  . Omega 3 (Fish, flax seeds, chia seeds, walnuts, almonds)  . Tart cherry (dried or extract)   Patient should be under the care of a physician while taking these supplements. This may not be reproduced without the permission of Dr. Bo Merino. Standing Labs We placed an order today for your standing lab work.    Please come back and get your standing labs in November and every 5 months.  We have open lab Monday through Friday from 8:30-11:30 AM and 1:30-4 PM at the office of Dr. Tresa Moore, PA.   The office is located at [address], [place], [place], [place] [address] No appointment is necessary.   Labs are drawn by [employer] [facility].  You may receive a bill from [place] for your lab work. If you have any questions regarding directions or hours of operation,  please call [phone].

## 2016-07-01 ENCOUNTER — Telehealth: Payer: Self-pay

## 2016-07-01 ENCOUNTER — Telehealth: Payer: Self-pay | Admitting: Radiology

## 2016-07-01 LAB — COMPLETE METABOLIC PANEL WITH GFR
ALBUMIN: 3.8 g/dL (ref 3.6–5.1)
ALT: 9 U/L (ref 6–29)
AST: 15 U/L (ref 10–35)
Alkaline Phosphatase: 63 U/L (ref 33–130)
BUN: 11 mg/dL (ref 7–25)
CHLORIDE: 103 mmol/L (ref 98–110)
CO2: 21 mmol/L (ref 20–31)
Calcium: 9.2 mg/dL (ref 8.6–10.4)
Creat: 0.87 mg/dL (ref 0.60–0.93)
GFR, Est African American: 78 mL/min (ref >=60)
GFR, Est Non African American: 68 mL/min (ref >=60)
Glucose, Bld: 81 mg/dL (ref 65–99)
Potassium: 4.2 mmol/L (ref 3.5–5.3)
SODIUM: 139 mmol/L (ref 135–146)
Total Bilirubin: 0.4 mg/dL (ref 0.2–1.2)
Total Protein: 7.5 g/dL (ref 6.1–8.1)

## 2016-07-01 NOTE — Telephone Encounter (Signed)
I have called patient to advise labs are normal  

## 2016-07-01 NOTE — Telephone Encounter (Signed)
-----   Message from Bo Merino, MD sent at 07/01/2016  8:30 AM EDT ----- Within normal limits

## 2016-07-01 NOTE — Telephone Encounter (Signed)
Patient's daughter would like a call back concerning her mother's Rx's that were given on yesterday.  Stated that she has some question's.  CB# is [phone].

## 2016-07-01 NOTE — Telephone Encounter (Signed)
I spoke to daughter. I have already discussed with patient.

## 2016-07-01 NOTE — Progress Notes (Signed)
Within normal limits

## 2016-08-05 ENCOUNTER — Other Ambulatory Visit: Payer: Self-pay | Admitting: Internal Medicine

## 2016-08-18 ENCOUNTER — Other Ambulatory Visit: Payer: Self-pay | Admitting: *Deleted

## 2016-08-18 ENCOUNTER — Telehealth: Payer: Self-pay | Admitting: Rheumatology

## 2016-08-18 DIAGNOSIS — M2550 Pain in unspecified joint: Secondary | ICD-10-CM | POA: Diagnosis not present

## 2016-08-18 NOTE — Telephone Encounter (Signed)
Please call patient to discuss issues she is having today due to decontinuing Plaquenil. Please call patients daughter , if patient not available. Tressia Danas [phone]

## 2016-08-18 NOTE — Telephone Encounter (Signed)
Patient advised she may come to the office to have her sed rate check. Patient will come this afternoon.

## 2016-08-18 NOTE — Telephone Encounter (Signed)
If she believes that her PMR is flaring we can get a sedimentation rate. Otherwise I will would recommend use of Tylenol for pain management.

## 2016-08-18 NOTE — Telephone Encounter (Signed)
Patient states she has been feeling terrible for the last three days. Patient states she has been having pain in her neck and the pain, stiffness and swelling down her right side. Patient states she was having soreness on the bottom of her right foot. Patient states she is having trouble moving her right leg. Patient states she was also having headaches. Patient states she is feeling a little better today. Patient states she has stopped the prednisone and has started having all of this pain. Patient is on PLQ.Marland Kitchen Please advise.

## 2016-08-19 ENCOUNTER — Telehealth: Payer: Self-pay | Admitting: *Deleted

## 2016-08-19 ENCOUNTER — Other Ambulatory Visit: Payer: Self-pay | Admitting: *Deleted

## 2016-08-19 DIAGNOSIS — M2550 Pain in unspecified joint: Secondary | ICD-10-CM

## 2016-08-19 LAB — SEDIMENTATION RATE: Sed Rate: 34 mm/hr — ABNORMAL HIGH (ref 0–30)

## 2016-08-19 NOTE — Telephone Encounter (Signed)
Error

## 2016-08-19 NOTE — Progress Notes (Signed)
ESR is elevated. Please advise patient to resume prednisone at 5 mg by mouth daily. If she needs prescription please call in. We will repeat ESR in 1 month.

## 2016-08-24 DIAGNOSIS — H40013 Open angle with borderline findings, low risk, bilateral: Secondary | ICD-10-CM | POA: Diagnosis not present

## 2016-08-24 NOTE — Progress Notes (Signed)
Subjective:    Patient ID: Angela Simmons, female    DOB: 1945-05-10, 71 y.o.   MRN: [id number]  HPI The patient is here for follow up.  PMR: She did come off the prednisone then end of June and had to go back on the end of July due to increased pain and elevated ESR.  She is taking 1 mg daily now and her pain is controlled.    Hypertension: She is taking her medication daily. She is compliant with a low sodium diet.  She denies chest pain, and regular headaches. She is exercising regularly - walking.  She does not monitor her blood pressure at home.    Diabetes: She is taking her medication daily as prescribed. She is compliant with a diabetic diet. She is exercising regularly - walking. She monitors her sugars and they have been running 64-100. She checks her feet daily and denies foot lesions. She is up-to-date with an ophthalmology examination.   Hyperlipidemia: She is taking her medication daily. She is compliant with a low fat/cholesterol diet. She is walking for exercise. She denies myalgias.   GERD:  She is taking her medication daily as prescribed.  She denies any GERD symptoms and feels her GERD is well controlled.    Medications and allergies reviewed with patient and updated if appropriate.  Patient Active Problem List   Diagnosis Date Noted  . Sicca syndrome ([place]) 06/23/2016  . Bilateral leg edema 04/07/2016  . Burning sensation of skin 04/07/2016  . PMR (polymyalgia rheumatica) (HCC) 03/27/2016  . Acute pain of both shoulders 03/27/2016  . History of lipoma 03/27/2016  . Osteopenia 03/04/2016  . Dysuria 02/26/2016  . High risk medication use 11/21/2015  . Autoimmune disease ([place]) 03/18/2015  . Inflammatory arthritis 03/18/2015  . ANA positive 12/02/2014  . Joint pain 11/22/2014  . Overweight (BMI 25.0-29.9) 08/18/2014  . Low back pain with radiation 08/18/2014  . Vulvar lesion 06/28/2014  . Hyperlipidemia 06/28/2014  . Closed comedones , vulva,, multiple  04/27/2014  . Chest pain, atypical 04/11/2014  . Tendonitis of shoulder 08/29/2013  . S/P right mastectomy 11/14/2012  . Type 2 diabetes, controlled, with neuropathy ([place]) 10/05/2012  . Allergic rhinitis 01/26/2011  . GERD 11/10/2009  . INTERSTITIAL CYSTITIS 10/31/2008  . BENIGN NEOPLASM OF VULVA 02/01/2008  . Other fatigue 02/01/2008  . Essential hypertension, benign 02/08/2007  . History of cancer of right breast 02/08/2007    Current Outpatient Prescriptions on File Prior to Visit  Medication Sig Dispense Refill  . amLODipine (NORVASC) 5 MG tablet Take 1 tablet (5 mg total) by mouth daily. 90 tablet 3  . aspirin (ADULT ASPIRIN EC LOW STRENGTH) 81 MG EC tablet Take 81 mg by mouth daily.     . benazepril (LOTENSIN) 40 MG tablet Take 1 tablet (40 mg total) by mouth daily. 90 tablet 1  . cloNIDine (CATAPRES) 0.1 MG tablet TAKE 1 TABLET (0.1 MG TOTAL) BY MOUTH AT BEDTIME. 90 tablet 1  . fluticasone (FLONASE) 50 MCG/ACT nasal spray USE 2 SPRAYS IN EACH NOSTRIL EVERY DAY (Patient taking differently: USE 2 SPRAYS IN EACH NOSTRIL EVERY DAY PRN) 48 g 0  . glucose blood (FORA V30A BLOOD GLUCOSE TEST) test strip Once daily testing 50 each 5  . hydroxychloroquine (PLAQUENIL) 200 MG tablet Take 1 tablet (200 mg total) by mouth 2 (two) times daily. 180 tablet 1  . Lancets (UNILET EXCELITE II) MISC TEST ONCE DAILY 100 each 0  . loratadine (CLARITIN) 10  MG tablet TAKE 1 TABLET BY MOUTH EVERY DAY (Patient taking differently: TAKE 1 TABLET BY MOUTH EVERY DAY PRN) 30 tablet 2  . lovastatin (MEVACOR) 40 MG tablet Take 1 tablet (40 mg total) by mouth daily. 90 tablet 1  . metFORMIN (GLUCOPHAGE) 500 MG tablet TAKE 1 TABLET BY MOUTH EVERY MORNING 90 tablet 0  . Multiple Vitamin (MULTIVITAMIN) tablet Take 1 tablet by mouth daily.    . Omega-3 Fatty Acids (FISH OIL) 1000 MG CAPS Take 1 capsule by mouth daily.     . pantoprazole (PROTONIX) 40 MG tablet Take 1 tablet (40 mg total) by mouth daily. 90 tablet 1  .  calcium-vitamin D (OSCAL WITH D) 500-200 MG-UNIT per tablet Take 1 tablet by mouth daily with breakfast.     No current facility-administered medications on file prior to visit.     Past Medical History:  Diagnosis Date  . Breast cancer ([place]) 1990s   right breast  . Diabetes mellitus without complication ([place])   . GERD (gastroesophageal reflux disease)   . HTN (hypertension)   . Obesity     Past Surgical History:  Procedure Laterality Date  . ABDOMINAL HYSTERECTOMY     [place], fibroids  . COLONOSCOPY  May 2003   Dr. Tamala Julian: normal  . COLONOSCOPY  06/22/2011   Procedure: COLONOSCOPY;  Surgeon: Danie Binder, MD;  Location[facility];  Service: Endoscopy;  Laterality: N/A;  10:30  . MASTECTOMY     right     Social History   Social History  . Marital status: Married    Spouse name: N/A  . Number of children: N/A  . Years of education: N/A   Social History Main Topics  . Smoking status: Never Smoker  . Smokeless tobacco: Never Used  . Alcohol use No  . Drug use: No  . Sexual activity: No   Other Topics Concern  . Not on file   Social History Narrative  . No narrative on file    Family History  Problem Relation Age of Onset  . Diabetes Mother   . Heart failure Mother   . Diabetes Father   . Emphysema Father   . Diabetes Brother   . Diabetes Sister   . Colon cancer Neg Hx     Review of Systems  Constitutional: Negative for chills and fever.  Respiratory: Positive for cough (occaisonal) and shortness of breath (minimal). Negative for wheezing.   Cardiovascular: Positive for palpitations (occasionally) and leg swelling (mild - ankles). Negative for chest pain.  Neurological: Positive for headaches (slight on occasion). Negative for light-headedness and numbness.       Objective:   Vitals:   08/25/16 1034  BP: (!) 144/86  Pulse: 74  Resp: 16  Temp: 98.5 F (36.9 C)   Wt Readings from Last 3 Encounters:  08/25/16 193 lb (87.5 kg)  06/30/16 197 lb  (89.4 kg)  04/07/16 198 lb (89.8 kg)   Body mass index is 28.5 kg/m.   Physical Exam    Constitutional: Appears well-developed and well-nourished. No distress.  HENT:  Head: Normocephalic and atraumatic.  Neck: Neck supple. No tracheal deviation present. No thyromegaly present.  No cervical lymphadenopathy Cardiovascular: Normal rate, regular rhythm and normal heart sounds.   No murmur heard. No carotid bruit .  No edema Pulmonary/Chest: Effort normal and breath sounds normal. No respiratory distress. No has no wheezes. No rales.  Skin: Skin is warm and dry. Not diaphoretic.  Psychiatric: Normal mood and  affect. Behavior is normal.    Diabetic Foot Exam - Simple   Simple Foot Form Diabetic Foot exam was performed with the following findings:  Yes 08/25/2016 11:05 AM  Visual Inspection No deformities, no ulcerations, no other skin breakdown bilaterally:  Yes See comments:  Yes Sensation Testing See comments:  Yes Pulse Check Posterior Tibialis and Dorsalis pulse intact bilaterally:  Yes Comments Callus on left foot.  Decreased sensation to touch and monofilament b/l      Assessment & Plan:    See Problem List for Assessment and Plan of chronic medical problems.

## 2016-08-24 NOTE — Patient Instructions (Addendum)
  Test(s) ordered today. Your results will be released to [place] (or called to you) after review, usually within 72hours after test completion. If any changes need to be made, you will be notified at that same time.   Medications reviewed and updated.  Changes include increasing the clonidine to twice a day.  Your prescription(s) have been submitted to your pharmacy. Please take as directed and contact our office if you believe you are having problem(s) with the medication(s).    Please followup in 6 months

## 2016-08-25 ENCOUNTER — Encounter: Payer: Self-pay | Admitting: Internal Medicine

## 2016-08-25 ENCOUNTER — Other Ambulatory Visit (INDEPENDENT_AMBULATORY_CARE_PROVIDER_SITE_OTHER): Payer: PPO

## 2016-08-25 ENCOUNTER — Ambulatory Visit (INDEPENDENT_AMBULATORY_CARE_PROVIDER_SITE_OTHER): Payer: PPO | Admitting: Internal Medicine

## 2016-08-25 VITALS — BP 144/86 | HR 74 | Temp 98.5°F | Resp 16 | Wt 193.0 lb

## 2016-08-25 DIAGNOSIS — I10 Essential (primary) hypertension: Secondary | ICD-10-CM | POA: Diagnosis not present

## 2016-08-25 DIAGNOSIS — K219 Gastro-esophageal reflux disease without esophagitis: Secondary | ICD-10-CM

## 2016-08-25 DIAGNOSIS — M2550 Pain in unspecified joint: Secondary | ICD-10-CM

## 2016-08-25 DIAGNOSIS — E1140 Type 2 diabetes mellitus with diabetic neuropathy, unspecified: Secondary | ICD-10-CM | POA: Diagnosis not present

## 2016-08-25 DIAGNOSIS — E7800 Pure hypercholesterolemia, unspecified: Secondary | ICD-10-CM

## 2016-08-25 LAB — SEDIMENTATION RATE: Sed Rate: 71 mm/hr — ABNORMAL HIGH (ref 0–30)

## 2016-08-25 LAB — COMPREHENSIVE METABOLIC PANEL
ALK PHOS: 57 U/L (ref 39–117)
ALT: 10 U/L (ref 0–35)
AST: 12 U/L (ref 0–37)
Albumin: 4 g/dL (ref 3.5–5.2)
BUN: 11 mg/dL (ref 6–23)
CO2: 30 mEq/L (ref 19–32)
Calcium: 9.6 mg/dL (ref 8.4–10.5)
Chloride: 103 mEq/L (ref 96–112)
Creatinine, Ser: 0.88 mg/dL (ref 0.40–1.20)
GFR: 81.64 mL/min (ref >60.00)
Glucose, Bld: 95 mg/dL (ref 70–99)
Potassium: 4.1 mEq/L (ref 3.5–5.1)
Sodium: 141 mEq/L (ref 135–145)
Total Bilirubin: 0.4 mg/dL (ref 0.2–1.2)
Total Protein: 7.9 g/dL (ref 6.0–8.3)

## 2016-08-25 LAB — HEMOGLOBIN A1C: Hgb A1c MFr Bld: 6.2 % (ref 4.6–6.5)

## 2016-08-25 MED ORDER — PREDNISONE 1 MG PO TABS: 1.0000 mg | ORAL_TABLET | Freq: Every day | ORAL | Status: DC

## 2016-08-25 MED ORDER — CLONIDINE HCL 0.1 MG PO TABS: 0.1000 mg | ORAL_TABLET | Freq: Two times a day (BID) | ORAL | 1 refills | Status: DC

## 2016-08-25 NOTE — Assessment & Plan Note (Signed)
Continue statin. 

## 2016-08-25 NOTE — Assessment & Plan Note (Signed)
BP Readings from Last 3 Encounters:  08/25/16 (!) 144/86  06/30/16 (!) 180/91  04/07/16 126/78   Not ideally controlled Start monitoring regularly at home Taking amlodipine and benzepril once daily, also taking clonidine only once daily Increase clonidine to twice daily If BP still elevated will consider increasing clonidine cmp

## 2016-08-25 NOTE — Assessment & Plan Note (Signed)
Lab Results  Component Value Date   HGBA1C 6.2 02/26/2016   Sugars well controlled at home Check a1c Continue diabetic diet and regular exercise

## 2016-08-25 NOTE — Assessment & Plan Note (Signed)
GERD controlled Continue daily medication  

## 2016-08-27 DIAGNOSIS — M79671 Pain in right foot: Secondary | ICD-10-CM | POA: Diagnosis not present

## 2016-08-27 DIAGNOSIS — E1142 Type 2 diabetes mellitus with diabetic polyneuropathy: Secondary | ICD-10-CM | POA: Diagnosis not present

## 2016-08-27 DIAGNOSIS — B351 Tinea unguium: Secondary | ICD-10-CM | POA: Diagnosis not present

## 2016-08-27 DIAGNOSIS — L84 Corns and callosities: Secondary | ICD-10-CM | POA: Diagnosis not present

## 2016-09-03 ENCOUNTER — Encounter: Payer: Self-pay | Admitting: Emergency Medicine

## 2016-09-09 ENCOUNTER — Telehealth: Payer: Self-pay | Admitting: Internal Medicine

## 2016-09-09 NOTE — Telephone Encounter (Signed)
Forwarding to MD assistant to call pt concerning lab letter that she sent...Angela Simmons

## 2016-09-09 NOTE — Telephone Encounter (Signed)
Pt called in and requested a nurse to call her back about the letter she got in the mail about her lab work

## 2016-09-09 NOTE — Telephone Encounter (Signed)
Yes, f/u with rheum

## 2016-09-09 NOTE — Telephone Encounter (Signed)
Spoke with pt and she would like to know what she needs to do about the Sed Rate. She was put on Prednisone 3 wks ago but the sed rate is even higher now. Does she needs to follow-up with Rheum?

## 2016-09-10 NOTE — Telephone Encounter (Signed)
Spoke with pt to inform.  

## 2016-09-11 ENCOUNTER — Telehealth: Payer: Self-pay | Admitting: Rheumatology

## 2016-09-11 DIAGNOSIS — M2550 Pain in unspecified joint: Secondary | ICD-10-CM

## 2016-09-11 NOTE — Telephone Encounter (Signed)
Please advise her to increase Pred to 105m . Repeat ESR in 1 mth.

## 2016-09-11 NOTE — Telephone Encounter (Signed)
Patient called stating that Dr. Quay Burow did a sed rate on 08/25/16 and the results was 71. Patient was instructed to contact our office for what to do. Patient was instructed on 08/19/16 to taken Prednisone 5 mg daily. Patient has only been taking Prednisone 1 mg daily. Please advise.

## 2016-09-11 NOTE — Telephone Encounter (Signed)
Patient calling in reference to elevate SED rate from GP. Please call patient to advise.

## 2016-09-14 NOTE — Telephone Encounter (Signed)
Patient has been advised to increase to the Prednisone to 5 mg. Patient advise to repeat ESR in 1 month.

## 2016-09-30 ENCOUNTER — Other Ambulatory Visit: Payer: Self-pay | Admitting: Internal Medicine

## 2016-11-07 ENCOUNTER — Other Ambulatory Visit: Payer: Self-pay | Admitting: Emergency Medicine

## 2016-11-07 MED ORDER — METFORMIN HCL 500 MG PO TABS: 500.0000 mg | ORAL_TABLET | Freq: Every morning | ORAL | 1 refills | Status: DC

## 2016-11-26 DIAGNOSIS — L84 Corns and callosities: Secondary | ICD-10-CM | POA: Diagnosis not present

## 2016-11-26 DIAGNOSIS — B351 Tinea unguium: Secondary | ICD-10-CM | POA: Diagnosis not present

## 2016-11-26 DIAGNOSIS — M79671 Pain in right foot: Secondary | ICD-10-CM | POA: Diagnosis not present

## 2016-11-26 DIAGNOSIS — E1142 Type 2 diabetes mellitus with diabetic polyneuropathy: Secondary | ICD-10-CM | POA: Diagnosis not present

## 2016-12-16 ENCOUNTER — Telehealth: Payer: Self-pay

## 2016-12-16 NOTE — Telephone Encounter (Signed)
Patient advised that she was due to have a repeat sed rate. Patient will come tomorrow for lab work. Patient advised once we receive the blood work we can advise her on how to proceed with the Prednisone. Patient advise to continue the 5 mg of prednisone until after the lab work. Patient verbalized understanding.

## 2016-12-16 NOTE — Telephone Encounter (Signed)
Patient was calling concerning a Rx refill for Prednisone.  Would like to know if Rx will be refilled or does she need to discontinue?  CB# is [phone].  Please advise.  Thank you.

## 2016-12-18 ENCOUNTER — Ambulatory Visit (INDEPENDENT_AMBULATORY_CARE_PROVIDER_SITE_OTHER): Payer: PPO

## 2016-12-18 ENCOUNTER — Other Ambulatory Visit: Payer: Self-pay | Admitting: *Deleted

## 2016-12-18 DIAGNOSIS — Z23 Encounter for immunization: Secondary | ICD-10-CM | POA: Diagnosis not present

## 2016-12-18 DIAGNOSIS — M2550 Pain in unspecified joint: Secondary | ICD-10-CM

## 2016-12-18 LAB — SEDIMENTATION RATE: SED RATE: 28 mm/h (ref 0–30)

## 2016-12-19 NOTE — Progress Notes (Deleted)
Office Visit Note  Patient: Angela Simmons             Date of Birth: 1945-11-04           MRN: [id number]             PCP: Binnie Rail, MD Referring: Binnie Rail, MD Visit Date: 12/30/2016 Occupation: @GUAROCC @    Subjective:  No chief complaint on file.   History of Present Illness: Angela Simmons is a 71 y.o. female ***   Activities of Daily Living:  Patient reports morning stiffness for *** {minute/hour:19697}.   Patient {ACTIONS;DENIES/REPORTS:21021675::"Denies"} nocturnal pain.  Difficulty dressing/grooming: {ACTIONS;DENIES/REPORTS:21021675::"Denies"} Difficulty climbing stairs: {ACTIONS;DENIES/REPORTS:21021675::"Denies"} Difficulty getting out of chair: {ACTIONS;DENIES/REPORTS:21021675::"Denies"} Difficulty using hands for taps, buttons, cutlery, and/or writing: {ACTIONS;DENIES/REPORTS:21021675::"Denies"}   No Rheumatology ROS completed.   PMFS History:  Patient Active Problem List   Diagnosis Date Noted  . Sicca syndrome ([place]) 06/23/2016  . Bilateral leg edema 04/07/2016  . Burning sensation of skin 04/07/2016  . PMR (polymyalgia rheumatica) (HCC) 03/27/2016  . History of lipoma 03/27/2016  . Osteopenia 03/04/2016  . High risk medication use 11/21/2015  . Autoimmune disease ([place]) 03/18/2015  . Inflammatory arthritis 03/18/2015  . ANA positive 12/02/2014  . Joint pain 11/22/2014  . Overweight (BMI 25.0-29.9) 08/18/2014  . Low back pain with radiation 08/18/2014  . Vulvar lesion 06/28/2014  . Hyperlipidemia 06/28/2014  . Closed comedones , vulva,, multiple 04/27/2014  . Chest pain, atypical 04/11/2014  . Tendonitis of shoulder 08/29/2013  . S/P right mastectomy 11/14/2012  . Type 2 diabetes, controlled, with neuropathy ([place]) 10/05/2012  . Allergic rhinitis 01/26/2011  . GERD 11/10/2009  . INTERSTITIAL CYSTITIS 10/31/2008  . BENIGN NEOPLASM OF VULVA 02/01/2008  . Essential hypertension, benign 02/08/2007  . History of cancer of right breast  02/08/2007    Past Medical History:  Diagnosis Date  . Breast cancer ([place]) 1990s   right breast  . Diabetes mellitus without complication ([place])   . GERD (gastroesophageal reflux disease)   . HTN (hypertension)   . Obesity     Family History  Problem Relation Age of Onset  . Diabetes Mother   . Heart failure Mother   . Diabetes Father   . Emphysema Father   . Diabetes Brother   . Diabetes Sister   . Colon cancer Neg Hx    Past Surgical History:  Procedure Laterality Date  . ABDOMINAL HYSTERECTOMY     [place], fibroids  . COLONOSCOPY  May 2003   Dr. Tamala Julian: normal  . COLONOSCOPY  06/22/2011   Procedure: COLONOSCOPY;  Surgeon: Danie Binder, MD;  [facility];  Service: Endoscopy;  Laterality: N/A;  10:30  . MASTECTOMY     right    Social History   Social History Narrative  . Not on file     Objective: Vital Signs: There were no vitals taken for this visit.   Physical Exam   Musculoskeletal Exam: ***  CDAI Exam: No CDAI exam completed.    Investigation: No additional findings.PLQ eye exam: 01/09/2016 CBC Latest Ref Rng & Units 06/30/2016 02/26/2016 10/11/2015  WBC 3.8 - 10.8 K/uL 4.2 4.3 5.7  Hemoglobin 11.7 - 15.5 g/dL 12.3 12.5 12.1  Hematocrit 35.0 - 45.0 % 37.4 38.3 37  Platelets 140 - 400 K/uL 269 245.0 261   CMP Latest Ref Rng & Units 08/25/2016 06/30/2016 02/26/2016  Glucose 70 - 99 mg/dL 95 81 97  BUN 6 - 23 mg/dL 11 11  13  Creatinine 0.40 - 1.20 mg/dL 0.88 0.87 0.89  Sodium 135 - 145 mEq/L 141 139 141  Potassium 3.5 - 5.1 mEq/L 4.1 4.2 3.9  Chloride 96 - 112 mEq/L 103 103 104  CO2 19 - 32 mEq/L 30 21 29   Calcium 8.4 - 10.5 mg/dL 9.6 9.2 9.5  Total Protein 6.0 - 8.3 g/dL 7.9 7.5 7.7  Total Bilirubin 0.2 - 1.2 mg/dL 0.4 0.4 0.4  Alkaline Phos 39 - 117 U/L 57 63 55  AST 0 - 37 U/L 12 15 13   ALT 0 - 35 U/L 10 9 11     Imaging: No results found.  Speciality Comments: No specialty comments available.    Procedures:  No procedures  performed Allergies: Levofloxacin; Nitrofurantoin; Penicillins; and Sulfonamide derivatives   Assessment / Plan:     Visit Diagnoses: No diagnosis found.    Orders: No orders of the defined types were placed in this encounter.  No orders of the defined types were placed in this encounter.   Face-to-face time spent with patient was *** minutes. 50% of time was spent in counseling and coordination of care.  Follow-Up Instructions: No Follow-up on file.   Earnestine Mealing, CMA  Note - This record has been created using Editor, commissioning.  Chart creation errors have been sought, but may not always  have been located. Such creation errors do not reflect on  the standard of medical care.

## 2016-12-21 NOTE — Progress Notes (Signed)
ESR is normal. She has history of PMR. She was tapering off prednisone.

## 2016-12-24 ENCOUNTER — Telehealth: Payer: Self-pay | Admitting: *Deleted

## 2016-12-24 ENCOUNTER — Telehealth: Payer: Self-pay

## 2016-12-24 NOTE — Telephone Encounter (Signed)
Patient advised sed rate is normal. Patient advised to taper off prednisone by 1 mg per month. Patient verbalized understanding.

## 2016-12-24 NOTE — Telephone Encounter (Signed)
-----  Message from Bo Merino, MD sent at 12/21/2016  1:22 PM EST ----- ESR is normal. She has history of PMR. She was tapering off prednisone.

## 2016-12-24 NOTE — Telephone Encounter (Signed)
Patient called concerning lab results.  Cb# is [phone].  Please advise.  Thank you.

## 2016-12-24 NOTE — Telephone Encounter (Signed)
error 

## 2016-12-25 DIAGNOSIS — H40013 Open angle with borderline findings, low risk, bilateral: Secondary | ICD-10-CM | POA: Diagnosis not present

## 2016-12-25 DIAGNOSIS — E119 Type 2 diabetes mellitus without complications: Secondary | ICD-10-CM | POA: Diagnosis not present

## 2016-12-25 LAB — HM DIABETES EYE EXAM

## 2016-12-30 ENCOUNTER — Ambulatory Visit: Payer: PPO | Admitting: Rheumatology

## 2016-12-30 NOTE — Progress Notes (Signed)
Office Visit Note  Patient: Angela Simmons             Date of Birth: 09-25-1945           MRN: [id number]             PCP: Binnie Rail, MD Referring: Binnie Rail, MD Visit Date: 12/31/2016 Occupation: @GUAROCC @    Subjective:  Tingling in bilateral hands    History of Present Illness: Angela Simmons is a 71 y.o. female with history of polymyalgia rheumatica and autoimmune disease.  Patient continues to have sicca symptoms.  She is still taking Prednisone 4 mg.  She is tapering by 1 mg each month.  She has no noticed any difference since decreasing her dose on 12/24/16. She states she takes Plaquenil twice daily everyday.  Her last eye exam was negative on 12/25/16.  She states she has tingling in her bilateral hands. She reports decreased grip strength. She denies a history of carpal tunnel.  She denies worsening symptoms with positional changes.  She states she has been experiencing a sensation of coldness in her bilateral lower legs.  She denies any color changes.  She states it is worse when laying down.  She states she occasionally has numbness in bilateral feet as well.  She states after standing for long periods of time her left ankle will swelling.  She denies any other joint swelling.       Activities of Daily Living:  Patient reports morning stiffness for 20 minutes.   Patient Denies nocturnal pain.  Difficulty dressing/grooming: Denies Difficulty climbing stairs: Denies Difficulty getting out of chair: Reports Difficulty using hands for taps, buttons, cutlery, and/or writing: Denies   Review of Systems  Constitutional: Positive for fatigue. Negative for weakness.  HENT: Positive for mouth dryness. Negative for mouth sores and nose dryness.   Eyes: Negative for dryness.  Respiratory: Negative for cough, hemoptysis, shortness of breath and difficulty breathing.   Cardiovascular: Negative.  Negative for chest pain, palpitations, hypertension, irregular heartbeat and  swelling in legs/feet.  Gastrointestinal: Negative.  Negative for blood in stool, constipation and diarrhea.  Endocrine: Negative for increased urination.  Genitourinary: Positive for painful urination.  Musculoskeletal: Positive for arthralgias, joint pain, joint swelling and morning stiffness. Negative for myalgias, muscle weakness, muscle tenderness and myalgias.  Skin: Negative for rash, hair loss, nodules/bumps, redness, skin tightness, ulcers and sensitivity to sunlight.  Allergic/Immunologic: Negative for susceptible to infections.  Neurological: Negative for dizziness, numbness and headaches.  Psychiatric/Behavioral: Negative.  Negative for depressed mood and sleep disturbance.    PMFS History:  Patient Active Problem List   Diagnosis Date Noted  . Sicca syndrome ([place]) 06/23/2016  . Bilateral leg edema 04/07/2016  . Burning sensation of skin 04/07/2016  . PMR (polymyalgia rheumatica) (HCC) 03/27/2016  . History of lipoma 03/27/2016  . Osteopenia 03/04/2016  . High risk medication use 11/21/2015  . Autoimmune disease ([place]) 03/18/2015  . Inflammatory arthritis 03/18/2015  . ANA positive 12/02/2014  . Joint pain 11/22/2014  . Overweight (BMI 25.0-29.9) 08/18/2014  . Low back pain with radiation 08/18/2014  . Vulvar lesion 06/28/2014  . Hyperlipidemia 06/28/2014  . Closed comedones , vulva,, multiple 04/27/2014  . Chest pain, atypical 04/11/2014  . Tendonitis of shoulder 08/29/2013  . S/P right mastectomy 11/14/2012  . Type 2 diabetes, controlled, with neuropathy ([place]) 10/05/2012  . Allergic rhinitis 01/26/2011  . GERD 11/10/2009  . INTERSTITIAL CYSTITIS 10/31/2008  . BENIGN NEOPLASM OF VULVA  02/01/2008  . Essential hypertension, benign 02/08/2007  . History of cancer of right breast 02/08/2007    Past Medical History:  Diagnosis Date  . Breast cancer ([place]) 1990s   right breast  . Diabetes mellitus without complication ([place])   . GERD (gastroesophageal reflux disease)    . HTN (hypertension)   . Obesity     Family History  Problem Relation Age of Onset  . Diabetes Mother   . Heart failure Mother   . Diabetes Father   . Emphysema Father   . Diabetes Brother   . Hypertension Brother   . Diabetes Sister   . Hypertension Sister   . Diabetes Brother   . Hypertension Brother   . Colon cancer Neg Hx    Past Surgical History:  Procedure Laterality Date  . ABDOMINAL HYSTERECTOMY     [place], fibroids  . COLONOSCOPY  May 2003   Dr. Tamala Julian: normal  . COLONOSCOPY  06/22/2011   Procedure: COLONOSCOPY;  Surgeon: Danie Binder, MD;  [facility];  Service: Endoscopy;  Laterality: N/A;  10:30  . MASTECTOMY     right    Social History   Social History Narrative  . Not on file     Objective: Vital Signs: BP (!) 159/93 (BP Location: Right Arm, Patient Position: Sitting, Cuff Size: Normal)   Pulse 85   Resp 19   Ht 5\' 9"  (1.753 m)   Wt 195 lb (88.5 kg)   BMI 28.80 kg/m    Physical Exam  Constitutional: She is oriented to person, place, and time. She appears well-developed and well-nourished.  HENT:  Head: Normocephalic and atraumatic.  Eyes: Conjunctivae and EOM are normal.  Neck: Normal range of motion.  Cardiovascular: Normal rate, regular rhythm, normal heart sounds and intact distal pulses.  Pulmonary/Chest: Effort normal and breath sounds normal.  Abdominal: Soft. Bowel sounds are normal.  Lymphadenopathy:    She has no cervical adenopathy.  Neurological: She is alert and oriented to person, place, and time.  Skin: Skin is warm and dry. Capillary refill takes less than 2 seconds.  Psychiatric: She has a normal mood and affect. Her behavior is normal.  Nursing note and vitals reviewed.    Musculoskeletal Exam: C-spine, thoracic, and lumbar spine good ROM.  Shoulder joints, elbow joints, and wrist joints good ROM.  Ganglion cysts present bilaterally over dorsal aspect of wrist. Negative phalen's sign.  Negative tinel's sign.  MCPs,  PIPs, and DIPs good ROM with no synovitis.  Complete fist formation.  Hip joints, knee joints, and ankle joints good ROM.   Left greater trochanteric bursa slightly tender to palpation.  MTPs, PIPs, and DIPs good ROM with no synovitis.       CDAI Exam: No CDAI exam completed.    Investigation: No additional findings.PLQ eye exam: 11/19/2015 CBC Latest Ref Rng & Units 06/30/2016 02/26/2016 10/11/2015  WBC 3.8 - 10.8 K/uL 4.2 4.3 5.7  Hemoglobin 11.7 - 15.5 g/dL 12.3 12.5 12.1  Hematocrit 35.0 - 45.0 % 37.4 38.3 37  Platelets 140 - 400 K/uL 269 245.0 261   CMP Latest Ref Rng & Units 08/25/2016 06/30/2016 02/26/2016  Glucose 70 - 99 mg/dL 95 81 97  BUN 6 - 23 mg/dL 11 11 13   Creatinine 0.40 - 1.20 mg/dL 0.88 0.87 0.89  Sodium 135 - 145 mEq/L 141 139 141  Potassium 3.5 - 5.1 mEq/L 4.1 4.2 3.9  Chloride 96 - 112 mEq/L 103 103 104  CO2 19 -  32 mEq/L 30 21 29   Calcium 8.4 - 10.5 mg/dL 9.6 9.2 9.5  Total Protein 6.0 - 8.3 g/dL 7.9 7.5 7.7  Total Bilirubin 0.2 - 1.2 mg/dL 0.4 0.4 0.4  Alkaline Phos 39 - 117 U/L 57 63 55  AST 0 - 37 U/L 12 15 13   ALT 0 - 35 U/L 10 9 11     Imaging: No results found.  Speciality Comments: No specialty comments available.    Procedures:  No procedures performed Allergies: Levofloxacin; Nitrofurantoin; Penicillins; and Sulfonamide derivatives   Assessment / Plan:     Visit Diagnoses: PMR (polymyalgia rheumatica) [facility]): Patient is clinically doing well.  She is currently on Prednisone 4 mg.  She will continue to taper by 1 mg every month.      Autoimmune disease ([place]) - Positive ANA, sicca symptoms: She continues to have mild sicca symptoms.  She denies any rashes or mouth sores.    Sicca syndrome ([place]) - She continues to have mild sicca symptoms, and she is using over-the-counter products.  High risk medication use - Plaquenil 200 mg by mouth twice a day, prednisone 4 mg by mouth daily: She continues to tolerate her medications. Plaquenil eye exam was  negative on 12/25/16 per patient.   CBC and CMP were drawn today to monitor for drug toxicity.  Labs will be repeated every 5 months. - Plan: CBC with Differential/Platelet, COMPLETE METABOLIC PANEL WITH GFR  Tendinitis of left shoulder: good ROM on exam.  No discomfort currently.    Peripheral neuropathy: Patient has been experiencing paresthesias in her bilateral hands and feet.  A referral will be made to neurology for further assessment.     Osteopenia of multiple sites: patient continues to take Calcium and vitamin D.    Other fatigue: Continues to have fatigue.    Other medical conditions are listed as follows:   History of gastroesophageal reflux (GERD)  History of hypertension  History of obesity  History of cancer - of right breast   History of hyperlipidemia  History of cystitis: Continues to have pain with urination at night occasionally.  Instructed patient to follow-up with PCP if symptoms persist.     Orders: Orders Placed This Encounter  Procedures  . CBC with Differential/Platelet  . COMPLETE METABOLIC PANEL WITH GFR  . Ambulatory referral to Neurology   No orders of the defined types were placed in this encounter.    Follow-Up Instructions: Return in about 5 months (around 05/31/2017) for Autoimmune Disease, Polymyalgia Rheumatica.  Bo Merino, MDNote - This record has been created using [employer].  Chart creation errors have been sought, but may not always  have been located. Such creation errors do not reflect on  the standard of medical care.

## 2016-12-31 ENCOUNTER — Telehealth: Payer: Self-pay

## 2016-12-31 ENCOUNTER — Ambulatory Visit (INDEPENDENT_AMBULATORY_CARE_PROVIDER_SITE_OTHER): Payer: PPO | Admitting: Rheumatology

## 2016-12-31 ENCOUNTER — Encounter: Payer: Self-pay | Admitting: Rheumatology

## 2016-12-31 VITALS — BP 159/93 | HR 85 | Resp 19 | Ht 69.0 in | Wt 195.0 lb

## 2016-12-31 DIAGNOSIS — G629 Polyneuropathy, unspecified: Secondary | ICD-10-CM

## 2016-12-31 DIAGNOSIS — M353 Polymyalgia rheumatica: Secondary | ICD-10-CM | POA: Diagnosis not present

## 2016-12-31 DIAGNOSIS — M778 Other enthesopathies, not elsewhere classified: Secondary | ICD-10-CM

## 2016-12-31 DIAGNOSIS — Z8719 Personal history of other diseases of the digestive system: Secondary | ICD-10-CM | POA: Diagnosis not present

## 2016-12-31 DIAGNOSIS — Z87440 Personal history of urinary (tract) infections: Secondary | ICD-10-CM

## 2016-12-31 DIAGNOSIS — M8589 Other specified disorders of bone density and structure, multiple sites: Secondary | ICD-10-CM

## 2016-12-31 DIAGNOSIS — R5383 Other fatigue: Secondary | ICD-10-CM

## 2016-12-31 DIAGNOSIS — Z79899 Other long term (current) drug therapy: Secondary | ICD-10-CM | POA: Diagnosis not present

## 2016-12-31 DIAGNOSIS — Z859 Personal history of malignant neoplasm, unspecified: Secondary | ICD-10-CM | POA: Diagnosis not present

## 2016-12-31 DIAGNOSIS — D8989 Other specified disorders involving the immune mechanism, not elsewhere classified: Secondary | ICD-10-CM

## 2016-12-31 DIAGNOSIS — Z8639 Personal history of other endocrine, nutritional and metabolic disease: Secondary | ICD-10-CM

## 2016-12-31 DIAGNOSIS — M3500 Sicca syndrome, unspecified: Secondary | ICD-10-CM | POA: Diagnosis not present

## 2016-12-31 DIAGNOSIS — M359 Systemic involvement of connective tissue, unspecified: Secondary | ICD-10-CM

## 2016-12-31 DIAGNOSIS — Z8679 Personal history of other diseases of the circulatory system: Secondary | ICD-10-CM | POA: Diagnosis not present

## 2016-12-31 DIAGNOSIS — M7582 Other shoulder lesions, left shoulder: Secondary | ICD-10-CM | POA: Diagnosis not present

## 2016-12-31 LAB — CBC WITH DIFFERENTIAL/PLATELET
BASOS ABS: 42 {cells}/uL (ref 0–200)
BASOS PCT: 1 %
EOS ABS: 21 {cells}/uL (ref 15–500)
Eosinophils Relative: 0.5 %
HCT: 38.1 % (ref 35.0–45.0)
Hemoglobin: 12.5 g/dL (ref 11.7–15.5)
Lymphs Abs: 1184 cells/uL (ref 850–3900)
MCH: 29.3 pg (ref 27.0–33.0)
MCHC: 32.8 g/dL (ref 32.0–36.0)
MCV: 89.4 fL (ref 80.0–100.0)
MPV: 10.8 fL (ref 7.5–12.5)
Monocytes Relative: 7 %
Neutro Abs: 2659 cells/uL (ref 1500–7800)
Neutrophils Relative %: 63.3 %
PLATELETS: 268 10*3/uL (ref 140–400)
RBC: 4.26 10*6/uL (ref 3.80–5.10)
RDW: 13 % (ref 11.0–15.0)
TOTAL LYMPHOCYTE: 28.2 %
WBC: 4.2 10*3/uL (ref 3.8–10.8)
WBCMIX: 294 {cells}/uL (ref 200–950)

## 2016-12-31 LAB — COMPLETE METABOLIC PANEL WITH GFR
AG Ratio: 1.2 (calc) (ref 1.0–2.5)
ALT: 9 U/L (ref 6–29)
AST: 11 U/L (ref 10–35)
Albumin: 3.9 g/dL (ref 3.6–5.1)
Alkaline phosphatase (APISO): 56 U/L (ref 33–130)
BUN / CREAT RATIO: 15 (calc) (ref 6–22)
BUN: 15 mg/dL (ref 7–25)
CO2: 29 mmol/L (ref 20–32)
CREATININE: 0.99 mg/dL — AB (ref 0.60–0.93)
Calcium: 9.4 mg/dL (ref 8.6–10.4)
Chloride: 104 mmol/L (ref 98–110)
GFR, EST AFRICAN AMERICAN: 67 mL/min/{1.73_m2} (ref >=60)
GFR, Est Non African American: 58 mL/min/{1.73_m2} — ABNORMAL LOW (ref >=60)
Globulin: 3.2 g/dL (calc) (ref 1.9–3.7)
Glucose, Bld: 98 mg/dL (ref 65–99)
Potassium: 4.1 mmol/L (ref 3.5–5.3)
Sodium: 141 mmol/L (ref 135–146)
Total Bilirubin: 0.3 mg/dL (ref 0.2–1.2)
Total Protein: 7.1 g/dL (ref 6.1–8.1)

## 2016-12-31 NOTE — Telephone Encounter (Signed)
Patient daughter would like a call back from Dr. Estanislado Pandy concerning patient's office visit today.  Cb# is [phone].  Please advise.  Thank you.

## 2016-12-31 NOTE — Telephone Encounter (Signed)
Spoke with patient's daughter and advised her of recommendations made during appointment. She verbalized understanding.

## 2017-01-01 NOTE — Progress Notes (Signed)
Labs are stable.

## 2017-01-15 ENCOUNTER — Encounter: Payer: Self-pay | Admitting: Internal Medicine

## 2017-02-18 ENCOUNTER — Other Ambulatory Visit: Payer: Self-pay | Admitting: Internal Medicine

## 2017-02-18 DIAGNOSIS — Z1231 Encounter for screening mammogram for malignant neoplasm of breast: Secondary | ICD-10-CM

## 2017-02-22 ENCOUNTER — Encounter (HOSPITAL_COMMUNITY): Payer: Self-pay

## 2017-02-22 ENCOUNTER — Other Ambulatory Visit: Payer: Self-pay | Admitting: Internal Medicine

## 2017-02-22 ENCOUNTER — Ambulatory Visit (HOSPITAL_COMMUNITY)
Admission: RE | Admit: 2017-02-22 | Discharge: 2017-02-22 | Disposition: A | Discharge status: Home / Self Care | Payer: PPO | Source: Ambulatory Visit | Attending: Internal Medicine | Admitting: Internal Medicine

## 2017-02-22 DIAGNOSIS — Z1231 Encounter for screening mammogram for malignant neoplasm of breast: Secondary | ICD-10-CM | POA: Diagnosis not present

## 2017-02-24 NOTE — Progress Notes (Signed)
Subjective:    Patient ID: Angela Simmons, female    DOB: 10/30/1945, 72 y.o.   MRN: [id number]  HPI The patient is here for follow up.  Hypertension: She is taking her medication daily. She is compliant with a low sodium diet.   She is exercising regularly - walking.  She does monitor her blood pressure at home occasionally.    Hyperlipidemia: She is taking her medication daily. She is compliant with a low fat/cholesterol diet. She is exercising regularly, but typically only exercises in the warmer weather.    Diabetes: She is taking her medication daily as prescribed. She is compliant with a diabetic diet. She is exercising regularly. She monitors her sugars and they have been running 66-100. She checks her feet daily and denies foot lesions. She is up-to-date with an ophthalmology examination.   GERD:  She is taking her medication daily as prescribed.  She denies any GERD symptoms and feels her GERD is well controlled.    Medications and allergies reviewed with patient and updated if appropriate.  Patient Active Problem List   Diagnosis Date Noted  . Sicca syndrome ([place]) 06/23/2016  . Bilateral leg edema 04/07/2016  . Burning sensation of skin 04/07/2016  . PMR (polymyalgia rheumatica) (HCC) 03/27/2016  . History of lipoma 03/27/2016  . Osteopenia 03/04/2016  . High risk medication use 11/21/2015  . Autoimmune disease ([place]) 03/18/2015  . Inflammatory arthritis 03/18/2015  . ANA positive 12/02/2014  . Joint pain 11/22/2014  . Overweight (BMI 25.0-29.9) 08/18/2014  . Low back pain with radiation 08/18/2014  . Vulvar lesion 06/28/2014  . Hyperlipidemia 06/28/2014  . Closed comedones , vulva,, multiple 04/27/2014  . Chest pain, atypical 04/11/2014  . Tendonitis of shoulder 08/29/2013  . S/P right mastectomy 11/14/2012  . Type 2 diabetes, controlled, with neuropathy ([place]) 10/05/2012  . Allergic rhinitis 01/26/2011  . GERD 11/10/2009  . INTERSTITIAL CYSTITIS 10/31/2008  .  BENIGN NEOPLASM OF VULVA 02/01/2008  . Essential hypertension, benign 02/08/2007  . History of cancer of right breast 02/08/2007    Current Outpatient Medications on File Prior to Visit  Medication Sig Dispense Refill  . amLODipine (NORVASC) 5 MG tablet Take 1 tablet (5 mg total) by mouth daily. 90 tablet 3  . aspirin (ADULT ASPIRIN EC LOW STRENGTH) 81 MG EC tablet Take 81 mg by mouth daily.     . benazepril (LOTENSIN) 40 MG tablet TAKE 1 TABLET BY MOUTH DAILY 90 tablet 1  . cloNIDine (CATAPRES) 0.1 MG tablet Take 1 tablet (0.1 mg total) by mouth 2 (two) times daily. (Patient taking differently: Take 0.1 mg by mouth 2 (two) times daily. ) 180 tablet 1  . fluticasone (FLONASE) 50 MCG/ACT nasal spray USE 2 SPRAYS IN EACH NOSTRIL EVERY DAY (Patient taking differently: USE 2 SPRAYS IN EACH NOSTRIL EVERY DAY PRN) 48 g 0  . glucose blood (FORA V30A BLOOD GLUCOSE TEST) test strip Once daily testing 50 each 5  . hydroxychloroquine (PLAQUENIL) 200 MG tablet TAKE 1 TABLET BY MOUTH TWICE DAILY 180 tablet 1  . Lancets (UNILET EXCELITE II) MISC TEST ONCE DAILY 100 each 0  . loratadine (CLARITIN) 10 MG tablet TAKE 1 TABLET BY MOUTH EVERY DAY (Patient taking differently: TAKE 1 TABLET BY MOUTH EVERY DAY PRN) 30 tablet 2  . lovastatin (MEVACOR) 40 MG tablet TAKE 1 TABLET BY MOUTH DAILY 90 tablet 1  . metFORMIN (GLUCOPHAGE) 500 MG tablet Take 1 tablet (500 mg total) by mouth every morning. [place]  tablet 1  . Multiple Vitamin (MULTIVITAMIN) tablet Take 1 tablet by mouth daily.    . Omega-3 Fatty Acids (FISH OIL) 1000 MG CAPS Take 1 capsule by mouth daily.     . pantoprazole (PROTONIX) 40 MG tablet TAKE 1 TABLET BY MOUTH DAILY 90 tablet 1  . predniSONE (DELTASONE) 1 MG tablet Take 1 tablet (1 mg total) by mouth daily with breakfast.    . calcium-vitamin D (OSCAL WITH D) 500-200 MG-UNIT per tablet Take 1 tablet by mouth daily with breakfast.     No current facility-administered medications on file prior to visit.       Past Medical History:  Diagnosis Date  . Breast cancer ([place]) 1990s   right breast  . Diabetes mellitus without complication ([place])   . GERD (gastroesophageal reflux disease)   . HTN (hypertension)   . Obesity     Past Surgical History:  Procedure Laterality Date  . ABDOMINAL HYSTERECTOMY     [place], fibroids  . COLONOSCOPY  May 2003   Dr. Tamala Julian: normal  . COLONOSCOPY  06/22/2011   Procedure: COLONOSCOPY;  Surgeon: Danie Binder, MD;  Location[facility];  Service: Endoscopy;  Laterality: N/A;  10:30  . MASTECTOMY     right     Social History   Socioeconomic History  . Marital status: Married    Spouse name: None  . Number of children: None  . Years of education: None  . Highest education level: None  Social Needs  . Financial resource strain: None  . Food insecurity - worry: None  . Food insecurity - inability: None  . Transportation needs - medical: None  . Transportation needs - non-medical: None  Occupational History  . None  Tobacco Use  . Smoking status: Never Smoker  . Smokeless tobacco: Never Used  Substance and Sexual Activity  . Alcohol use: No  . Drug use: No  . Sexual activity: No  Other Topics Concern  . None  Social History Narrative  . None    Family History  Problem Relation Age of Onset  . Diabetes Mother   . Heart failure Mother   . Diabetes Father   . Emphysema Father   . Diabetes Brother   . Hypertension Brother   . Diabetes Sister   . Hypertension Sister   . Diabetes Brother   . Hypertension Brother   . Colon cancer Neg Hx     Review of Systems  Constitutional: Positive for diaphoresis (occ at night). Negative for chills and fever.  Respiratory: Positive for shortness of breath (sometimes with activity). Negative for cough and wheezing.   Cardiovascular: Positive for chest pain (chronic, transient, no change), palpitations (sometimes) and leg swelling (ankles when standing long time).  Gastrointestinal: Positive for  abdominal pain (sometimes).       Rare gerd  Neurological: Positive for light-headedness (sometimes), numbness (sometimes in feet) and headaches (occ in morning, goes away).       Objective:   Vitals:   02/25/17 1109  BP: 124/86  Pulse: 89  Resp: 16  Temp: 97.8 F (36.6 C)  SpO2: 98%   Wt Readings from Last 3 Encounters:  02/25/17 197 lb (89.4 kg)  12/31/16 195 lb (88.5 kg)  08/25/16 193 lb (87.5 kg)   Body mass index is 29.09 kg/m.   Physical Exam    Constitutional: Appears well-developed and well-nourished. No distress.  HENT:  Head: Normocephalic and atraumatic.  Neck: Neck supple. No tracheal deviation present. No  thyromegaly present.  No cervical lymphadenopathy Cardiovascular: Normal rate, regular rhythm and normal heart sounds.   No murmur heard. No carotid bruit .  No edema Pulmonary/Chest: Effort normal and breath sounds normal. No respiratory distress. No has no wheezes. No rales.  Skin: Skin is warm and dry. Not diaphoretic.  Psychiatric: Normal mood and affect. Behavior is normal.      Assessment & Plan:    See Problem List for Assessment and Plan of chronic medical problems.

## 2017-02-25 ENCOUNTER — Other Ambulatory Visit (INDEPENDENT_AMBULATORY_CARE_PROVIDER_SITE_OTHER): Payer: PPO

## 2017-02-25 ENCOUNTER — Ambulatory Visit (INDEPENDENT_AMBULATORY_CARE_PROVIDER_SITE_OTHER): Payer: PPO | Admitting: Internal Medicine

## 2017-02-25 ENCOUNTER — Encounter: Payer: Self-pay | Admitting: Internal Medicine

## 2017-02-25 VITALS — BP 124/86 | HR 89 | Temp 97.8°F | Resp 16 | Wt 197.0 lb

## 2017-02-25 DIAGNOSIS — I10 Essential (primary) hypertension: Secondary | ICD-10-CM | POA: Diagnosis not present

## 2017-02-25 DIAGNOSIS — Z0000 Encounter for general adult medical examination without abnormal findings: Secondary | ICD-10-CM | POA: Diagnosis not present

## 2017-02-25 DIAGNOSIS — K219 Gastro-esophageal reflux disease without esophagitis: Secondary | ICD-10-CM | POA: Diagnosis not present

## 2017-02-25 DIAGNOSIS — E1140 Type 2 diabetes mellitus with diabetic neuropathy, unspecified: Secondary | ICD-10-CM | POA: Diagnosis not present

## 2017-02-25 DIAGNOSIS — E7849 Other hyperlipidemia: Secondary | ICD-10-CM

## 2017-02-25 DIAGNOSIS — E1142 Type 2 diabetes mellitus with diabetic polyneuropathy: Secondary | ICD-10-CM | POA: Diagnosis not present

## 2017-02-25 DIAGNOSIS — L84 Corns and callosities: Secondary | ICD-10-CM | POA: Diagnosis not present

## 2017-02-25 DIAGNOSIS — B351 Tinea unguium: Secondary | ICD-10-CM | POA: Diagnosis not present

## 2017-02-25 DIAGNOSIS — M79673 Pain in unspecified foot: Secondary | ICD-10-CM | POA: Diagnosis not present

## 2017-02-25 LAB — COMPREHENSIVE METABOLIC PANEL
ALT: 11 U/L (ref 0–35)
AST: 12 U/L (ref 0–37)
Albumin: 3.7 g/dL (ref 3.5–5.2)
Alkaline Phosphatase: 53 U/L (ref 39–117)
BUN: 16 mg/dL (ref 6–23)
CO2: 30 meq/L (ref 19–32)
Calcium: 9 mg/dL (ref 8.4–10.5)
Chloride: 105 mEq/L (ref 96–112)
Creatinine, Ser: 1.07 mg/dL (ref 0.40–1.20)
GFR: 65.06 mL/min (ref >60.00)
Glucose, Bld: 76 mg/dL (ref 70–99)
Potassium: 4 mEq/L (ref 3.5–5.1)
Sodium: 140 mEq/L (ref 135–145)
Total Bilirubin: 0.4 mg/dL (ref 0.2–1.2)
Total Protein: 7.2 g/dL (ref 6.0–8.3)

## 2017-02-25 LAB — LIPID PANEL
Cholesterol: 116 mg/dL (ref 0–200)
HDL: 51.4 mg/dL (ref >39.00)
LDL CALC: 54 mg/dL (ref 0–99)
NonHDL: 64.14
Total CHOL/HDL Ratio: 2
Triglycerides: 51 mg/dL (ref 0.0–149.0)
VLDL: 10.2 mg/dL (ref 0.0–40.0)

## 2017-02-25 LAB — HEMOGLOBIN A1C: Hgb A1c MFr Bld: 6.4 % (ref 4.6–6.5)

## 2017-02-25 NOTE — Assessment & Plan Note (Addendum)
Sugars on low side - in 60's at times Will hold metformin Continue to monitor sugars at home and frequently more than 120 will consider restarting medication Encouraged regular exercise Continue low sugar/carbohydrate diet Encouraged weight loss

## 2017-02-25 NOTE — Patient Instructions (Addendum)
Test(s) ordered today. Your results will be released to [place] (or called to you) after review, usually within 72hours after test completion. If any changes need to be made, you will be notified at that same time.  All other Health Maintenance issues reviewed.   All recommended immunizations and age-appropriate screenings are up-to-date or discussed.  No immunizations administered today.   Medications reviewed and updated.  Changes include stopping the metfomin.  If your sugars are frequently more than 120 call us.     Please followup in 6 months  [email or web address] or down load app on smart phone  Aunt Berenice Primas website lists multiple social resources for individuals such as: food, health, money, house hold goods, transit, medical supplies, job training and legal services.  Continue doing brain stimulating activities (puzzles, reading, adult coloring books, staying active) to keep memory sharp.   Continue to eat heart healthy diet (full of fruits, vegetables, whole grains, lean protein, water--limit salt, fat, and sugar intake) and increase physical activity as tolerated.   Angela Simmons , Thank you for taking time to come for your Medicare Wellness Visit. I appreciate your ongoing commitment to your health goals. Please review the following plan we discussed and let me know if I can assist you in the future.   These are the goals we discussed: Goals    . Patient Stated     Lose weight by increasing exercise and continuing to eat healthy.       This is a list of the screening recommended for you and due dates:  Health Maintenance  Topic Date Due  . Tetanus Vaccine  07/04/2013  . Hemoglobin A1C  02/25/2017  . Complete foot exam   08/25/2017  . Eye exam for diabetics  12/25/2017  . Mammogram  02/23/2019  . DEXA scan (bone density measurement)  02/26/2019  . Colon Cancer Screening  06/21/2021  . Flu Shot  Completed  .  Hepatitis C: One time screening is recommended by Center for  Disease Control  (CDC) for  adults born from [birth year] through 1964.   Completed  . Pneumonia vaccines  Completed    Kegel Exercises Kegel exercises help strengthen the muscles that support the rectum, vagina, small intestine, bladder, and uterus. Doing Kegel exercises can help:  Improve bladder and bowel control.  Improve sexual response.  Reduce problems and discomfort during pregnancy.  Kegel exercises involve squeezing your pelvic floor muscles, which are the same muscles you squeeze when you try to stop the flow of urine. The exercises can be done while sitting, standing, or lying down, but it is best to vary your position. Phase 1 exercises 1. Squeeze your pelvic floor muscles tight. You should feel a tight lift in your rectal area. If you are a female, you should also feel a tightness in your vaginal area. Keep your stomach, buttocks, and legs relaxed. 2. Hold the muscles tight for up to 10 seconds. 3. Relax your muscles. Repeat this exercise 50 times a day or as many times as told by your health care provider. Continue to do this exercise for at least 4-6 weeks or for as long as told by your health care provider. This information is not intended to replace advice given to you by your health care provider. Make sure you discuss any questions you have with your health care provider. Document Released: 12/23/2011 Document Revised: 08/31/2015 Document Reviewed: 11/25/2014 Elsevier Interactive Patient Education  2018 [employer].  Carbohydrate Counting for Diabetes Mellitus, Adult Carbohydrate counting  is a method for keeping track of how many carbohydrates you eat. Eating carbohydrates naturally increases the amount of sugar (glucose) in the blood. Counting how many carbohydrates you eat helps keep your blood glucose within normal limits, which helps you manage your diabetes (diabetes mellitus). It is important to know how many carbohydrates you can safely have in each meal. This is  different for every person. A diet and nutrition specialist (registered dietitian) can help you make a meal plan and calculate how many carbohydrates you should have at each meal and snack. Carbohydrates are found in the following foods:  Grains, such as breads and cereals.  Dried beans and soy products.  Starchy vegetables, such as potatoes, peas, and corn.  Fruit and fruit juices.  Milk and yogurt.  Sweets and snack foods, such as cake, cookies, candy, chips, and soft drinks.  How do I count carbohydrates? There are two ways to count carbohydrates in food. You can use either of the methods or a combination of both. Reading "Nutrition Facts" on packaged food The "Nutrition Facts" list is included on the labels of almost all packaged foods and beverages in the [place] It includes:  The serving size.  Information about nutrients in each serving, including the grams (g) of carbohydrate per serving.  To use the "Nutrition Facts":  Decide how many servings you will have.  Multiply the number of servings by the number of carbohydrates per serving.  The resulting number is the total amount of carbohydrates that you will be having.  Learning standard serving sizes of other foods When you eat foods containing carbohydrates that are not packaged or do not include "Nutrition Facts" on the label, you need to measure the servings in order to count the amount of carbohydrates:  Measure the foods that you will eat with a food scale or measuring cup, if needed.  Decide how many standard-size servings you will eat.  Multiply the number of servings by 15. Most carbohydrate-rich foods have about 15 g of carbohydrates per serving. ? For example, if you eat 8 oz (170 g) of strawberries, you will have eaten 2 servings and 30 g of carbohydrates (2 servings x 15 g = 30 g).  For foods that have more than one food mixed, such as soups and casseroles, you must count the carbohydrates in each food that  is included.  The following list contains standard serving sizes of common carbohydrate-rich foods. Each of these servings has about 15 g of carbohydrates:   hamburger bun or  English muffin.   oz (15 mL) syrup.   oz (14 g) jelly.  1 slice of bread.  1 six-inch tortilla.  3 oz (85 g) cooked rice or pasta.  4 oz (113 g) cooked dried beans.  4 oz (113 g) starchy vegetable, such as peas, corn, or potatoes.  4 oz (113 g) hot cereal.  4 oz (113 g) mashed potatoes or  of a large baked potato.  4 oz (113 g) canned or frozen fruit.  4 oz (120 mL) fruit juice.  4-6 crackers.  6 chicken nuggets.  6 oz (170 g) unsweetened dry cereal.  6 oz (170 g) plain fat-free yogurt or yogurt sweetened with artificial sweeteners.  8 oz (240 mL) milk.  8 oz (170 g) fresh fruit or one small piece of fruit.  24 oz (680 g) popped popcorn.  Example of carbohydrate counting Sample meal  3 oz (85 g) chicken breast.  6 oz (170 g) brown rice.  4 oz (113 g) corn.  8 oz (240 mL) milk.  8 oz (170 g) strawberries with sugar-free whipped topping. Carbohydrate calculation 1. Identify the foods that contain carbohydrates: ? Rice. ? Corn. ? Milk. ? Strawberries. 2. Calculate how many servings you have of each food: ? 2 servings rice. ? 1 serving corn. ? 1 serving milk. ? 1 serving strawberries. 3. Multiply each number of servings by 15 g: ? 2 servings rice x 15 g = 30 g. ? 1 serving corn x 15 g = 15 g. ? 1 serving milk x 15 g = 15 g. ? 1 serving strawberries x 15 g = 15 g. 4. Add together all of the amounts to find the total grams of carbohydrates eaten: ? 30 g + 15 g + 15 g + 15 g = 75 g of carbohydrates total. This information is not intended to replace advice given to you by your health care provider. Make sure you discuss any questions you have with your health care provider. Document Released: 01/05/2005 Document Revised: 07/26/2015 Document Reviewed: 06/19/2015 Elsevier  Interactive Patient Education  2018 [employer].  Diabetes Mellitus and Nutrition When you have diabetes (diabetes mellitus), it is very important to have healthy eating habits because your blood sugar (glucose) levels are greatly affected by what you eat and drink. Eating healthy foods in the appropriate amounts, at about the same times every day, can help you:  Control your blood glucose.  Lower your risk of heart disease.  Improve your blood pressure.  Reach or maintain a healthy weight.  Every person with diabetes is different, and each person has different needs for a meal plan. Your health care provider may recommend that you work with a diet and nutrition specialist (dietitian) to make a meal plan that is best for you. Your meal plan may vary depending on factors such as:  The calories you need.  The medicines you take.  Your weight.  Your blood glucose, blood pressure, and cholesterol levels.  Your activity level.  Other health conditions you have, such as heart or kidney disease.  How do carbohydrates affect me? Carbohydrates affect your blood glucose level more than any other type of food. Eating carbohydrates naturally increases the amount of glucose in your blood. Carbohydrate counting is a method for keeping track of how many carbohydrates you eat. Counting carbohydrates is important to keep your blood glucose at a healthy level, especially if you use insulin or take certain oral diabetes medicines. It is important to know how many carbohydrates you can safely have in each meal. This is different for every person. Your dietitian can help you calculate how many carbohydrates you should have at each meal and for snack. Foods that contain carbohydrates include:  Bread, cereal, rice, pasta, and crackers.  Potatoes and corn.  Peas, beans, and lentils.  Milk and yogurt.  Fruit and juice.  Desserts, such as cakes, cookies, ice cream, and candy.  How does alcohol  affect me? Alcohol can cause a sudden decrease in blood glucose (hypoglycemia), especially if you use insulin or take certain oral diabetes medicines. Hypoglycemia can be a life-threatening condition. Symptoms of hypoglycemia (sleepiness, dizziness, and confusion) are similar to symptoms of having too much alcohol. If your health care provider says that alcohol is safe for you, follow these guidelines:  Limit alcohol intake to no more than 1 drink per day for nonpregnant women and 2 drinks per day for men. One drink equals 12 oz of beer,  5 oz of wine, or 1 oz of hard liquor.  Do not drink on an empty stomach.  Keep yourself hydrated with water, diet soda, or unsweetened iced tea.  Keep in mind that regular soda, juice, and other mixers may contain a lot of sugar and must be counted as carbohydrates.  What are tips for following this plan? Reading food labels  Start by checking the serving size on the label. The amount of calories, carbohydrates, fats, and other nutrients listed on the label are based on one serving of the food. Many foods contain more than one serving per package.  Check the total grams (g) of carbohydrates in one serving. You can calculate the number of servings of carbohydrates in one serving by dividing the total carbohydrates by 15. For example, if a food has 30 g of total carbohydrates, it would be equal to 2 servings of carbohydrates.  Check the number of grams (g) of saturated and trans fats in one serving. Choose foods that have low or no amount of these fats.  Check the number of milligrams (mg) of sodium in one serving. Most people should limit total sodium intake to less than 2,300 mg per day.  Always check the nutrition information of foods labeled as "low-fat" or "nonfat". These foods may be higher in added sugar or refined carbohydrates and should be avoided.  Talk to your dietitian to identify your daily goals for nutrients listed on the  label. Shopping  Avoid buying canned, premade, or processed foods. These foods tend to be high in fat, sodium, and added sugar.  Shop around the outside edge of the grocery store. This includes fresh fruits and vegetables, bulk grains, fresh meats, and fresh dairy. Cooking  Use low-heat cooking methods, such as baking, instead of high-heat cooking methods like deep frying.  Cook using healthy oils, such as olive, canola, or sunflower oil.  Avoid cooking with butter, cream, or high-fat meats. Meal planning  Eat meals and snacks regularly, preferably at the same times every day. Avoid going long periods of time without eating.  Eat foods high in fiber, such as fresh fruits, vegetables, beans, and whole grains. Talk to your dietitian about how many servings of carbohydrates you can eat at each meal.  Eat 4-6 ounces of lean protein each day, such as lean meat, chicken, fish, eggs, or tofu. 1 ounce is equal to 1 ounce of meat, chicken, or fish, 1 egg, or 1/4 cup of tofu.  Eat some foods each day that contain healthy fats, such as avocado, nuts, seeds, and fish. Lifestyle   Check your blood glucose regularly.  Exercise at least 30 minutes 5 or more days each week, or as told by your health care provider.  Take medicines as told by your health care provider.  Do not use any products that contain nicotine or tobacco, such as cigarettes and e-cigarettes. If you need help quitting, ask your health care provider.  Work with a Social worker or diabetes educator to identify strategies to manage stress and any emotional and social challenges. What are some questions to ask my health care provider?  Do I need to meet with a diabetes educator?  Do I need to meet with a dietitian?  What number can I call if I have questions?  When are the best times to check my blood glucose? Where to find more information:  American Diabetes Association: diabetes.org/food-and-fitness/food  Academy of  Nutrition and Dietetics: PokerClues.dk  [employer] of Diabetes and Digestive  and Kidney Diseases (NIH): ContactWire.be Summary  A healthy meal plan will help you control your blood glucose and maintain a healthy lifestyle.  Working with a diet and nutrition specialist (dietitian) can help you make a meal plan that is best for you.  Keep in mind that carbohydrates and alcohol have immediate effects on your blood glucose levels. It is important to count carbohydrates and to use alcohol carefully. This information is not intended to replace advice given to you by your health care provider. Make sure you discuss any questions you have with your health care provider. Document Released: 10/02/2004 Document Revised: 02/10/2016 Document Reviewed: 02/10/2016 Elsevier Interactive Patient Education  [employer].

## 2017-02-25 NOTE — Assessment & Plan Note (Signed)
Check lipid panel  Continue daily statin Regular exercise and healthy diet encouraged  

## 2017-02-25 NOTE — Assessment & Plan Note (Signed)
BP well controlled Current regimen effective and well tolerated Continue current medications at current doses cmp  

## 2017-02-25 NOTE — Progress Notes (Addendum)
Subjective:   Angela Simmons is a 72 y.o. female who presents for Medicare Annual (Subsequent) preventive examination.  Review of Systems:  No ROS.  Medicare Wellness Visit. Additional risk factors are reflected in the social history.  Cardiac Risk Factors include: advanced age (>47men, >18 women);diabetes mellitus;dyslipidemia;hypertension;obesity (BMI >30kg/m2) Sleep patterns: gets up 1-2 times nightly to void and sleeps 6-7 hours nightly.   Home Safety/Smoke Alarms: Feels safe in home. Smoke alarms in place.  Living environment; residence and Firearm Safety: 1-story house/ trailer, no firearms. Lives with husband, no needs for DME, good support system Seat Belt Safety/Bike Helmet: Wears seat belt.    Objective:     Vitals: BP 124/86   Pulse 89   Temp 97.8 F (36.6 C) (Oral)   Resp 16   Wt 197 lb (89.4 kg)   SpO2 98%   BMI 29.09 kg/m   Body mass index is 29.09 kg/m.  Advanced Directives 02/25/2017 02/21/2016 06/22/2011  Does Patient Have a Medical Advance Directive? Yes Yes Patient does not have advance directive  Type of Advance Directive [place];Living will [place];Living will -  Copy of [place] in Chart? No - copy requested No - copy requested -  Pre-existing out of facility DNR order (yellow form or pink MOST form) - - No    Tobacco Social History   Tobacco Use  Smoking Status Never Smoker  Smokeless Tobacco Never Used     Counseling given: Not Answered  Past Medical History:  Diagnosis Date  . Breast cancer ([place]) 1990s   right breast  . Diabetes mellitus without complication ([place])   . GERD (gastroesophageal reflux disease)   . HTN (hypertension)   . Obesity    Past Surgical History:  Procedure Laterality Date  . ABDOM[place]CT[place]  . COLONOSCOPY  May 2003   Dr. Tamala Julian: normal  . COLONOSCOPY  06/22/2011   Procedure: COLONOSCOPY;  Surgeon: Danie Binder, MD;  Location: [facility];  Service: Endoscopy;  Laterality: N/A;  10:30  . MASTECTOMY     right    Family History  Problem Relation Age of Onset  . Diabetes Mother   . Heart failure Mother   . Diabetes Father   . Emphysema Father   . Diabetes Brother   . Hypertension Brother   . Diabetes Sister   . Hypertension Sister   . Diabetes Brother   . Hypertension Brother   . Colon cancer Neg Hx    Social History   Socioeconomic History  . Marital status: Married    Spouse name: None  . Number of children: None  . Years of education: None  . Highest education level: None  Social Needs  . Financial resource strain: None  . Food insecurity - worry: None  . Food insecurity - inability: None  . Transportation needs - medical: None  . Transportation needs - non-medical: None  Occupational History  . None  Tobacco Use  . Smoking status: Never Smoker  . Smokeless tobacco: Never Used  Substance and Sexual Activity  . Alcohol use: No  . Drug use: No  . Sexual activity: No    Birth control/protection: Rhythm  Other Topics Concern  . None  Social History Narrative  . None    Outpatient Encounter Medications as of 02/25/2017  Medication Sig  . amLODipine (NORVASC) 5 MG tablet Take 1 tablet (5 mg total) by mouth daily.  Marland Kitchen  aspirin (ADULT ASPIRIN EC LOW STRENGTH) 81 MG EC tablet Take 81 mg by mouth daily.   . benazepril (LOTENSIN) 40 MG tablet TAKE 1 TABLET BY MOUTH DAILY  . cloNIDine (CATAPRES) 0.1 MG tablet Take 1 tablet (0.1 mg total) by mouth 2 (two) times daily. (Patient taking differently: Take 0.1 mg by mouth 2 (two) times daily. )  . fluticasone (FLONASE) 50 MCG/ACT nasal spray USE 2 SPRAYS IN EACH NOSTRIL EVERY DAY (Patient taking differently: USE 2 SPRAYS IN EACH NOSTRIL EVERY DAY PRN)  . glucose blood (FORA V30A BLOOD GLUCOSE TEST) test strip Once daily testing  . hydroxychloroquine (PLAQUENIL) 200 MG tablet TAKE 1 TABLET BY MOUTH TWICE DAILY  . Lancets (UNILET EXCELITE II) MISC TEST ONCE  DAILY  . loratadine (CLARITIN) 10 MG tablet TAKE 1 TABLET BY MOUTH EVERY DAY (Patient taking differently: TAKE 1 TABLET BY MOUTH EVERY DAY PRN)  . lovastatin (MEVACOR) 40 MG tablet TAKE 1 TABLET BY MOUTH DAILY  . Multiple Vitamin (MULTIVITAMIN) tablet Take 1 tablet by mouth daily.  . Omega-3 Fatty Acids (FISH OIL) 1000 MG CAPS Take 1 capsule by mouth daily.   . pantoprazole (PROTONIX) 40 MG tablet TAKE 1 TABLET BY MOUTH DAILY  . predniSONE (DELTASONE) 1 MG tablet Take 1 tablet (1 mg total) by mouth daily with breakfast.  . [DISCONTINUED] metFORMIN (GLUCOPHAGE) 500 MG tablet Take 1 tablet (500 mg total) by mouth every morning.  . calcium-vitamin D (OSCAL WITH D) 500-200 MG-UNIT per tablet Take 1 tablet by mouth daily with breakfast.   No facility-administered encounter medications on file as of 02/25/2017.     Activities of Daily Living In your present state of health, do you have any difficulty performing the following activities: 02/25/2017  Hearing? N  Vision? N  Difficulty concentrating or making decisions? N  Walking or climbing stairs? N  Dressing or bathing? N  Doing errands, shopping? N  Preparing Food and eating ? N  Using the Toilet? N  In the past six months, have you accidently leaked urine? N  Do you have problems with loss of bowel control? N  Managing your Medications? N  Managing your Finances? N  Housekeeping or managing your Housekeeping? N  Some recent data might be hidden    Patient Care Team: Binnie Rail, MD as PCP - General (Internal Medicine) Danie Binder, MD (Gastroenterology) Bo Merino, MD as Consulting Physician (Rheumatology)    Assessment:   This is a routine wellness examination for Angela Simmons. Physical assessment deferred to PCP.   Exercise Activities and Dietary recommendations Current Exercise Habits: The patient does not participate in regular exercise at present(discussed going back to [employer], chair exercise pamphlets  provided)  Diet (meal preparation, eat out, water intake, caffeinated beverages, dairy products, fruits and vegetables): in general, a "healthy" diet  , well balanced, eats a variety of fruits and vegetables daily, limits salt, fat/cholesterol, sugar, caffeine, drinks 6-8 glasses of water daily.  Reviewed heart healthy and diabetic diet, Diet education was attached to patient's AVS.  Goals    . Patient Stated     Lose weight by increasing exercise and continuing to eat healthy.       Fall Risk Fall Risk  02/25/2017 02/21/2016 08/02/2015 11/23/2013 11/14/2012  Falls in the past year? No Yes No No No  Number falls in past yr: - 1 - - -    Depression Screen PHQ 2/9 Scores 02/25/2017 02/21/2016 08/02/2015 11/23/2013  PHQ - 2 Score 0 0  0 2  PHQ- 9 Score - - - 7     Cognitive Function MMSE - Mini Mental State Exam 02/25/2017 02/21/2016  Orientation to time 5 5  Orientation to Place 5 5  Registration 3 3  Attention/ Calculation 4 0  Attention/Calculation-comments - patient states she cannot spell or count  Recall 2 3  Language- name 2 objects 2 2  Language- repeat 1 1  Language- follow 3 step command 3 3  Language- read & follow direction 1 1  Write a sentence 1 1  Copy design 1 0  Total score 28 24        Immunization History  Administered Date(s) Administered  . Influenza Split 11/14/2010  . Influenza Whole 10/18/2006, 10/31/2008, 11/06/2009  . Influenza, High Dose Seasonal PF 11/22/2014, 02/21/2016, 12/18/2016  . Influenza,inj,Quad PF,6+ Mos 11/14/2012, 11/23/2013  . Pneumococcal Conjugate-13 04/11/2014  . Pneumococcal Polysaccharide-23 09/17/2011  . Td 07/05/2003  . Zoster 06/15/2013   Screening Tests Health Maintenance  Topic Date Due  . TETANUS/TDAP  07/04/2013  . HEMOGLOBIN A1C  02/25/2017  . FOOT EXAM  08/25/2017  . OPHTHALMOLOGY EXAM  12/25/2017  . MAMMOGRAM  02/23/2019  . DEXA SCAN  02/26/2019  . COLONOSCOPY  06/21/2021  . INFLUENZA VACCINE  Completed  . Hepatitis  C Screening  Completed  . PNA vac Low Risk Adult  Completed      Plan:     Provided patient with Kai Levins website which  lists multiple social resources for individuals such as: food, health, money, house hold goods, transit, medical supplies, job training and legal services. Nurse will search for resource to assist with the cost of  Plaquenil medication.  Continue doing brain stimulating activities (puzzles, reading, adult coloring books, staying active) to keep memory sharp.   Continue to eat heart healthy diet (full of fruits, vegetables, whole grains, lean protein, water--limit salt, fat, and sugar intake) and increase physical activity as tolerated.  I have personally reviewed and noted the following in the patient's chart:   . Medical and social history . Use of alcohol, tobacco or illicit drugs  . Current medications and supplements . Functional ability and status . Nutritional status . Physical activity . Advanced directives . List of other physicians . Vitals . Screenings to include cognitive, depression, and falls . Referrals and appointments  In addition, I have reviewed and discussed with patient certain preventive protocols, quality metrics, and best practice recommendations. A written personalized care plan for preventive services as well as general preventive health recommendations were provided to patient.     Michiel Cowboy, RN  02/25/2017   Medical screening examination/treatment/procedure(s) were performed by non-physician practitioner and as supervising physician I was immediately available for consultation/collaboration. I agree with above. Binnie Rail, MD

## 2017-02-26 ENCOUNTER — Encounter: Payer: Self-pay | Admitting: Emergency Medicine

## 2017-03-01 ENCOUNTER — Encounter: Payer: Self-pay | Admitting: Diagnostic Neuroimaging

## 2017-03-01 ENCOUNTER — Ambulatory Visit (INDEPENDENT_AMBULATORY_CARE_PROVIDER_SITE_OTHER): Payer: PPO | Admitting: Diagnostic Neuroimaging

## 2017-03-01 VITALS — BP 159/89 | HR 75 | Ht 69.0 in | Wt 198.8 lb

## 2017-03-01 DIAGNOSIS — G629 Polyneuropathy, unspecified: Secondary | ICD-10-CM | POA: Diagnosis not present

## 2017-03-01 NOTE — Progress Notes (Signed)
[facility]  PATIENT: Angela Simmons DOB: 1945/06/19  REFERRING CLINICIAN: S Deveshwar HISTORY FROM: patient and daughter  REASON FOR VISIT: new consult    HISTORICAL  CHIEF COMPLAINT:  Chief Complaint  Patient presents with  . Neuropathy    rm 6, New Pt, "why do my legs stay cold a lot, started 1 1/2 years ago"    HISTORY OF PRESENT ILLNESS:   72 year old right-handed female here for evaluation of abnormal sensation in the legs.  Patient reports at least 6 months of cold sensation in her feet and legs.  Sometimes she has burning on the top of her feet.  She has some tingling sensation in her fingers.  Symptoms have been fairly stable over the last few months.  Symptoms are intermittent.  Patient reports painful sensation a few days out of the week.  She is not taking any medication for this problem.  Patient denies any neck or low back pain.  Patient has diagnosis of diabetes, which is under fairly good control.  Last hemoglobin A1c 6.4.  Patient also under treatment for autoimmune disease, with positive ANA and sicca symptoms.  Patient also has history of polymyalgia rheumatica.  Patient has been on prednisone in the past but has been tapered off.   REVIEW OF SYSTEMS: Full 14 system review of systems performed and negative with exception of: Blurred vision swelling in legs hearing loss easy bruising memory loss joint pain joint swelling aching muscles decreased energy runny nose skin sensitivity itching chills weight gain fatigue.   ALLERGIES: Allergies  Allergen Reactions  . Levofloxacin Hives and Itching  . Nitrofurantoin Hives and Itching  . Penicillins Hives and Itching  . Sulfonamide Derivatives Itching    HOME MEDICATIONS: Outpatient Medications Prior to Visit  Medication Sig Dispense Refill  . amLODipine (NORVASC) 5 MG tablet Take 1 tablet (5 mg total) by mouth daily. 90 tablet 3  . aspirin (ADULT ASPIRIN EC LOW STRENGTH) 81 MG EC tablet  Take 81 mg by mouth daily.     . benazepril (LOTENSIN) 40 MG tablet TAKE 1 TABLET BY MOUTH DAILY 90 tablet 1  . cloNIDine (CATAPRES) 0.1 MG tablet Take 1 tablet (0.1 mg total) by mouth 2 (two) times daily. (Patient taking differently: Take 0.1 mg by mouth 2 (two) times daily. ) 180 tablet 1  . fluticasone (FLONASE) 50 MCG/ACT nasal spray USE 2 SPRAYS IN EACH NOSTRIL EVERY DAY (Patient taking differently: USE 2 SPRAYS IN EACH NOSTRIL EVERY DAY PRN) 48 g 0  . glucose blood (FORA V30A BLOOD GLUCOSE TEST) test strip Once daily testing 50 each 5  . hydroxychloroquine (PLAQUENIL) 200 MG tablet TAKE 1 TABLET BY MOUTH TWICE DAILY 180 tablet 1  . Lancets (UNILET EXCELITE II) MISC TEST ONCE DAILY 100 each 0  . loratadine (CLARITIN) 10 MG tablet TAKE 1 TABLET BY MOUTH EVERY DAY (Patient taking differently: TAKE 1 TABLET BY MOUTH EVERY DAY PRN) 30 tablet 2  . lovastatin (MEVACOR) 40 MG tablet TAKE 1 TABLET BY MOUTH DAILY 90 tablet 1  . Multiple Vitamin (MULTIVITAMIN) tablet Take 1 tablet by mouth daily.    . Omega-3 Fatty Acids (FISH OIL) 1000 MG CAPS Take 1 capsule by mouth daily.     . pantoprazole (PROTONIX) 40 MG tablet TAKE 1 TABLET BY MOUTH DAILY 90 tablet 1  . predniSONE (DELTASONE) 1 MG tablet Take 1 tablet (1 mg total) by mouth daily with breakfast.    . calcium-vitamin D (OSCAL WITH D) 500-200  MG-UNIT per tablet Take 1 tablet by mouth daily with breakfast.     No facility-administered medications prior to visit.     PAST MEDICAL HISTORY: Past Medical History:  Diagnosis Date  . Breast cancer ([place]) 1990s   right breast  . Diabetes mellitus without complication ([place])   . GERD (gastroesophageal reflux disease)   . HTN (hypertension)   . Hypercholesterolemia   . Obesity     PAST SURGICAL HISTORY: Past Surgical History:  Procedure Laterality Date  . ABDOMINAL HYSTERECTOMY  1993   [place], fibroids  . COLONOSCOPY  May 2003   Dr. Tamala Julian: normal  . COLONOSCOPY  06/22/2011   Procedure:  COLONOSCOPY;  Surgeon: Danie Binder, MD;  Location: [facility];  Service: Endoscopy;  Laterality: N/A;  10:30  . MASTECTOMY  1995   right     FAMILY HISTORY: Family History  Problem Relation Age of Onset  . Diabetes Mother        diabetic coma  . Heart failure Mother   . Diabetes Father   . Emphysema Father   . Diabetes Sister   . Hypercholesterolemia Sister   . Hypercholesterolemia Brother   . Diabetes Brother   . Hypertension Brother   . Hypercholesterolemia Brother   . Diabetes Sister   . Hypertension Sister   . Hypercholesterolemia Sister   . Hypercholesterolemia Sister   . Hypercholesterolemia Sister   . Diabetes Brother   . Hypertension Brother   . Colon cancer Neg Hx     SOCIAL HISTORY:  Social History   Socioeconomic History  . Marital status: Married    Spouse name: Not on file  . Number of children: 3  . Years of education: 7  . Highest education level: Not on file  Social Needs  . Financial resource strain: Not on file  . Food insecurity - worry: Not on file  . Food insecurity - inability: Not on file  . Transportation needs - medical: Not on file  . Transportation needs - non-medical: Not on file  Occupational History  . Not on file  Tobacco Use  . Smoking status: Never Smoker  . Smokeless tobacco: Never Used  Substance and Sexual Activity  . Alcohol use: No  . Drug use: No  . Sexual activity: No    Birth control/protection: Rhythm  Other Topics Concern  . Not on file  Social History Narrative   Lives at home with husband   Caffeine, maybe 1 a day     PHYSICAL EXAM  GENERAL EXAM/CONSTITUTIONAL: Vitals:  Vitals:   03/01/17 0849  BP: (!) 159/89  Pulse: 75  Weight: 198 lb 12.8 oz (90.2 kg)  Height: 5\' 9"  (1.753 m)     Body mass index is 29.36 kg/m.  Visual Acuity Screening   Right eye Left eye Both eyes  Without correction:     With correction: 20/30 20/30   Comments: bifocals    Patient is in no distress; well  developed, nourished and groomed; neck is supple  CARDIOVASCULAR:  Examination of carotid arteries is normal; no carotid bruits  Regular rate and rhythm, no murmurs  Examination of peripheral vascular system (DP PALPABLE AND SYMM) by observation and palpation is normal  EYES:  Ophthalmoscopic exam of optic discs and posterior segments is normal; no papilledema or hemorrhages  MUSCULOSKELETAL:  Gait, strength, tone, movements noted in Neurologic exam below  NEUROLOGIC: MENTAL STATUS:  MMSE - Mini Mental State Exam 02/25/2017 02/21/2016  Orientation to time 5 5  Orientation to Place 5 5  Registration 3 3  Attention/ Calculation 4 0  Attention/Calculation-comments - patient states she cannot spell or count  Recall 2 3  Language- name 2 objects 2 2  Language- repeat 1 1  Language- follow 3 step command 3 3  Language- read & follow direction 1 1  Write a sentence 1 1  Copy design 1 0  Total score 28 24    awake, alert, oriented to person, place and time  recent and remote memory intact  normal attention and concentration  language fluent, comprehension intact, naming intact,   fund of knowledge appropriate  CRANIAL NERVE:   2nd - no papilledema on fundoscopic exam  2nd, 3rd, 4th, 6th - pupils equal and reactive to light, visual fields full to confrontation, extraocular muscles intact, no nystagmus  5th - facial sensation symmetric  7th - facial strength symmetric  8th - hearing intact  9th - palate elevates symmetrically, uvula midline  11th - shoulder shrug symmetric  12th - tongue protrusion midline  MOTOR:   normal bulk and tone, full strength in the BUE, BLE; EXCEPT BILATERAL HIP FLEXION 4+  SENSORY:   normal and symmetric to light touch, pinprick, temperature, vibration EXCEPT DECR PP, VIB AT TOES; DECR PP IN GRADIENT UP TO SHINS  COORDINATION:   finger-nose-finger, fine finger movements normal  REFLEXES:   deep tendon reflexes --> BUE 1,  KNEES TRACE, ANKLES ABSENT and symmetric  GAIT/STATION:   narrow based gait; able to walk on toes, heels and tandem; romberg is negative    DIAGNOSTIC DATA (LABS, IMAGING, TESTING) - I reviewed patient records, labs, notes, testing and imaging myself where available.  Lab Results  Component Value Date   WBC 4.2 12/31/2016   HGB 12.5 12/31/2016   HCT 38.1 12/31/2016   MCV 89.4 12/31/2016   PLT 268 12/31/2016      Component Value Date/Time   NA 140 02/25/2017 1225   NA 141 10/11/2015   K 4.0 02/25/2017 1225   CL 105 02/25/2017 1225   CO2 30 02/25/2017 1225   GLUCOSE 76 02/25/2017 1225   BUN 16 02/25/2017 1225   BUN 13 10/11/2015   CREATININE 1.07 02/25/2017 1225   CREATININE 0.99 (H) 12/31/2016 1009   CALCIUM 9.0 02/25/2017 1225   PROT 7.2 02/25/2017 1225   ALBUMIN 3.7 02/25/2017 1225   AST 12 02/25/2017 1225   ALT 11 02/25/2017 1225   ALKPHOS 53 02/25/2017 1225   BILITOT 0.4 02/25/2017 1225   GFRNONAA 58 (L) 12/31/2016 1009   GFRAA 67 12/31/2016 1009   Lab Results  Component Value Date   CHOL 116 02/25/2017   HDL 51.40 02/25/2017   LDLCALC 54 02/25/2017   TRIG 51.0 02/25/2017   CHOLHDL 2 02/25/2017   Lab Results  Component Value Date   HGBA1C 6.4 02/25/2017   No results found for: [id number] Lab Results  Component Value Date   TSH 2.31 02/26/2016    03/25/11 MRI lumbar spine [I reviewed images myself and agree with interpretation. -VRP]  1.  L3-L4 moderate bilateral facet arthrosis with mild central stenosis associated with facet hypertrophy and ligamentum flavum redundancy. 2.  Left eccentric broad-based L4-L5 disc bulge.  Left greater than right lateral recess stenosis and mild to moderate left foraminal stenosis potentially affecting L4 and L5 nerves. 3.  Moderate bilateral foraminal stenosis at L5-S1 associated with broad-based posterior disc bulging and endplate osteophytes.     ASSESSMENT AND PLAN  72 y.o. year old  female here with 6-12 months  of lower extremity cold sensation, burning sensation, numbness, with neurologic exam notable for decreased sensation in the feet and gradient up to shins, decreased reflexes lower extremity, consistent with peripheral neuropathy (could be related to underlying diabetes or autoimmune disease).   Ddx: neuropathy (diabetic, autoimmune) vs lumbar spinal stenosis   1. Neuropathy      PLAN: - check labs for other causes of neuropathy - monitor symptoms; may consider EMG/NCS and MRI lumbar spine in future; may consider gabapentin or duloxetine in future if pain significantly increases  Orders Placed This Encounter  Procedures  . Vitamin B12  . Sjogren's syndrome antibods(ssa + ssb)   Return if symptoms worsen or fail to improve, for return to PCP and rheumatology.    Penni Bombard, MD 06/24/7701, 4:03 AM Certified in Neurology, Neurophysiology and Neuroimaging  [facility] [address], [place], [place] [address] [phone]

## 2017-03-01 NOTE — Patient Instructions (Signed)
-   check labs  - monitor symptoms

## 2017-03-02 LAB — SJOGREN'S SYNDROME ANTIBODS(SSA + SSB)
ENA SSA (RO) AB: 4.6 AI — AB (ref 0.0–0.9)
ENA SSB (LA) Ab: <0.2 AI (ref 0.0–0.9)

## 2017-03-02 LAB — VITAMIN B12: Vitamin B-12: 915 pg/mL (ref 232–1245)

## 2017-03-03 ENCOUNTER — Telehealth: Payer: Self-pay | Admitting: *Deleted

## 2017-03-03 ENCOUNTER — Other Ambulatory Visit: Payer: Self-pay | Admitting: *Deleted

## 2017-03-03 NOTE — Telephone Encounter (Addendum)
Called to inform the patient that [facility]se will contact her in regards to medication assistance for plaquenil, assess medical history educational needs, and social resource needs. Patient verbalized understanding and was appreciative for the call and the information.

## 2017-03-03 NOTE — Telephone Encounter (Signed)
Attempted to reach patient on home phone, continuously rang. Called daughter,Angela Simmons, on DPR and advised her that patient's labs showed the SSA positive. Advised this could represent sjogrens disease which can cause neuropathy. Advised she be sure to continue with her upcoming rheumatology follow up. Daughter verbalized understanding,, appreciation.

## 2017-03-03 NOTE — Patient Outreach (Signed)
[facility]) Care Management  03/03/2017  LYNORE COSCIA [birth year] [id number]   Referral received from Emelia Loron, [place] @ [place] re: Mrs. Kayleen Memos needs for disease management related to Diabetes and Hypertension. Mrs. Hariston also indicates that she is having difficulty affording her Plaquenil (needs within the week) and is experiencing strain affording her medications in general.   Plan: I have referred Mrs. Wyka to our nursing and pharmacy case management teams for outreach. Mrs. Donaldson is aware that she will be called by our team as she received education about our program from [place].    [place] Management  [phone]

## 2017-03-05 ENCOUNTER — Other Ambulatory Visit: Payer: Self-pay | Admitting: *Deleted

## 2017-03-05 NOTE — Patient Outreach (Signed)
Referral received from MD office, diagnosis DM, HTN and unable to afford medications (referral for pharmacy also completed), spoke with pt, HIPAA verified, pt states she "has no problems" and seems hesitant about [facility] program but agreeable to home visit for assessment.  Pt did not have time to talk today and states she will try to be at home for the visit.  PLAN See pt for initial home visit week of 03/15/17  Jacqlyn Larsen [facility], [place] Coordinator [phone]

## 2017-03-13 ENCOUNTER — Other Ambulatory Visit: Payer: Self-pay | Admitting: Emergency Medicine

## 2017-03-13 MED ORDER — BLOOD GLUCOSE MONITOR KIT: PACK | 0 refills | Status: DC

## 2017-03-16 ENCOUNTER — Other Ambulatory Visit: Payer: Self-pay | Admitting: Pharmacist

## 2017-03-16 ENCOUNTER — Telehealth: Payer: Self-pay | Admitting: *Deleted

## 2017-03-16 NOTE — Patient Outreach (Addendum)
[place] [facility]) Care Management  03/16/2017  KATALIA CHOMA 1945/04/08 [id number]   72 year old female referred to [place] Management by Shelba Flake for medication assistance for hydroxychloroquine (Plaquenil).    PMHx includes, but not limited to, HTN, h/o breast cancer, GERD, DM Type II, Hyperlipidemia, Autoimmune disease and Polymyalgia rheumatica.  Insurance:  [employer] PPO plan II  Subjective:  Successful outreach call to Ms. Akiba Melfi.  HIPAA identifiers verified.  Patient agreeable to medication reconciliation.  Pt. Reports difficulty affording hydroxychloroquine.  She reports that she will run out of her medicine after this week.  Patient reports that she uses a weekly pill box.  She reports that she checks her fasting CBG and BP daily.  Objective:   HgA1c 6.4 (02/25/17)  Medications Reviewed Today    Reviewed by Rudean Haskell, RPH (Pharmacist) on 03/16/17 at 1414  Med List Status: <None>  Medication Order Taking? Sig Documenting Provider Last Dose Status Informant  amLODipine (NORVASC) 5 MG tablet [id number] Yes Take 1 tablet (5 mg total) by mouth daily. Binnie Rail, MD Taking Active   aspirin (ADULT ASPIRIN EC LOW STRENGTH) 81 MG EC tablet [id number] Yes Take 81 mg by mouth daily.  [provider] Taking Active   benazepril (LOTENSIN) 40 MG tablet [id number] Yes TAKE 1 TABLET BY MOUTH DAILY Burns, Claudina Lick, MD Taking Active   blood glucose meter kit and supplies KIT [id number] Yes Dispense based on patient and insurance preference. Test blood sugars one time daily Burns, Claudina Lick, MD Taking Active   calcium-vitamin D (OSCAL WITH D) 500-200 MG-UNIT tablet [id number] Yes Take 1 tablet by mouth daily with breakfast. [provider] Taking Active Self  cloNIDine (CATAPRES) 0.1 MG tablet [id number] Yes Take 1 tablet (0.1 mg total) by mouth 2 (two) times daily.  Patient taking differently:  Take 0.1 mg by mouth 2 (two) times daily.    Binnie Rail, MD Taking  Active   fluticasone [facility]) 50 MCG/ACT nasal spray [id number] Yes USE 2 SPRAYS IN EACH NOSTRIL EVERY DAY  Patient taking differently:  USE 2 SPRAYS IN EACH NOSTRIL EVERY DAY PRN   Fayrene Helper, MD Taking Active   glucose blood (FORA V30A BLOOD GLUCOSE TEST) test strip [id number] Yes Once daily testing Fayrene Helper, MD Taking Active   hydroxychloroquine (PLAQUENIL) 200 MG tablet [id number] Yes TAKE 1 TABLET BY MOUTH TWICE DAILY Burns, Claudina Lick, MD Taking Active   Lancets Beulah Gandy [place] II) MISC [id number] Yes TEST ONCE DAILY Fayrene Helper, MD Taking Active   loratadine (CLARITIN) 10 MG tablet [id number] Yes TAKE 1 TABLET BY MOUTH EVERY DAY  Patient taking differently:  TAKE 1 TABLET BY MOUTH EVERY DAY PRN   Fayrene Helper, MD Taking Active   lovastatin (MEVACOR) 40 MG tablet [id number] Yes TAKE 1 TABLET BY MOUTH DAILY Burns, Claudina Lick, MD Taking Active   Multiple Vitamin (MULTIVITAMIN) tablet [id number] Yes Take 1 tablet by mouth daily. [provider] Taking Active   Omega-3 Fatty Acids (FISH OIL) 1000 MG CAPS [id number] Yes Take 1 capsule by mouth daily.  [provider] Taking Active   pantoprazole (PROTONIX) 40 MG tablet [id number] Yes TAKE 1 TABLET BY MOUTH DAILY Burns, Claudina Lick, MD Taking Active   predniSONE (DELTASONE) 1 MG tablet [id number] Yes Take 1 tablet (1 mg total) by mouth daily with breakfast. Binnie Rail, MD Taking Active   ranitidine (ZANTAC) 150 MG tablet [id number] Yes Take 150 mg by  mouth at bedtime. [provider] Taking Active Self  Med List Note Dionne Milo [facility] 03/15/17 1650):    Sometimes patient gets her medications through [facility].          Assessment:  Drugs sorted by system:  Neurologic/Psychologic: none  Cardiovascular:  Amlodipine, ASA, Benazepril, Clonidine, Lovastatin, Omega-3 fish oil,   Pulmonary/Allergy: Fluticasone, Loratadine,   Gastrointestinal: Ranitidine, Pantoprazole    Endocrine:none  Renal:none  Topical: none  Pain: none  Vitamins/Minerals:  MVI, Calcium carbonate  Infectious Diseases: none  Autoimmune Disease:  Prednisone, Hydroxychloroquine  Duplications in therapy: none Gaps in therapy: none Medications to avoid in the elderly:  Clonidine: Per Beers criteria this medication has been identified as potentially inappropriate in patients 65 years and older (independent of diagnosis or condition) due to the high risk of CNS adverse effects and risk of bradycardia and orthostatic hypotension. Avoid use as a first-line antihypertensive Drug interactions: none  Other issues noted:  Medication Assistance:  Patient unable to afford hydroxychloroquine.  Co-pay through [employer] is $40/30 days supply or $80/90 days supply through mail order.  Estimated monthly income for patient and spouse is [phone] after deductions.  Patient may be eligible for Extra Help/LIS program through [facility] based on income.  Extra Help program would provide reduced co-pays for all medications with generics $3.40 and brand name medications $8.50. Patient requests that I discuss program with her daughter before applying. Outreach call placed to daughter who will return my call.  I provided to my phone number to patient as well.     Plan:  Await call back from daughter.  I will call her again later this week if I have not heard back beforehand regarding patient assistance with hydroxychloroquine.   Ralene Bathe, PharmD, [place] [phone]   Addendum:   Incoming call received from Ms. Zylka's daughter, Rodena Piety.  HIPAA identifiers verified.  I reviewed Extra Help / LIS program with her and provided website information.  Daughter will discuss with her mother before making a decision about applying.   Plan: Follow-up with patient and daughter later this week as scheduled unless I hear back beforehand.   Ralene Bathe, PharmD, [place] [phone]

## 2017-03-16 NOTE — Telephone Encounter (Signed)
Called patient back in response to phone message patient left nurse. Patient stated that the plaquenil assistance program ran out of funds and are unable to help her get her plaquenil prescription she cannot afford. Nurse discussed with patient to inform [facility] nurse who is scheduled to come to her home Thursday of this and of any other medication she cannot afford and of any other community resource needs she may have. Nurse shared that the [facility] pharmacist may be able to assist her and the [facility] may be able to assist with other financial hardships. It was explained that the [facility] nurse will do a wonderful job of assisting the patient with understanding and controlling her diabetes and hypertension. Nurse also stated that the patient should inform Dr. Estanislado Pandy about having difficulty affording plaquenil to see if their facility could offer any assistance with plaquenil and/or could prescribe another medication the patient could afford. Patient stated that she would call Dr. Arlean Hopping office to inform. Nurse will also call plaquenil's assistance program to dig further for any possible assistance and will contact patient if she can locate any financial assistance. Patient does have nurse's contact number and will call nurse if she can be of any further assistance.

## 2017-03-18 ENCOUNTER — Other Ambulatory Visit: Payer: Self-pay | Admitting: *Deleted

## 2017-03-18 ENCOUNTER — Encounter: Payer: Self-pay | Admitting: *Deleted

## 2017-03-19 ENCOUNTER — Telehealth: Payer: Self-pay | Admitting: Rheumatology

## 2017-03-19 ENCOUNTER — Ambulatory Visit: Payer: Self-pay | Admitting: Pharmacist

## 2017-03-19 ENCOUNTER — Encounter: Payer: Self-pay | Admitting: *Deleted

## 2017-03-19 ENCOUNTER — Other Ambulatory Visit: Payer: Self-pay | Admitting: Pharmacist

## 2017-03-19 MED ORDER — HYDROXYCHLOROQUINE SULFATE 200 MG PO TABS: 200.0000 mg | ORAL_TABLET | Freq: Two times a day (BID) | ORAL | 1 refills | Status: DC

## 2017-03-19 NOTE — Telephone Encounter (Signed)
Last Visit: 12/31/16 Next Visit: 06/02/17 Labs: 12/31/16 Stable PLQ Eye Exam: 12/25/16 WNL  Okay to refill per Dr. Estanislado Pandy

## 2017-03-19 NOTE — Telephone Encounter (Signed)
Patient called requesting prescription refill of Plaquenil to be sent to [facility].  Patient is completely out of medicine and would like it refilled today if possible.

## 2017-03-19 NOTE — Patient Outreach (Signed)
[facility]) Care Management  03/19/2017  Angela Simmons 01/10/1946 [id number]  Successful outreach call to [name]'s daughter regarding Extra Help application.  HIPAA identifiers verified.  Patient's daughter has not had time to review application yet.  We reviewed 2019 Extra Help income limits.  Patient may be eligible for Partial Extra Help ($2,134/month for couple) based on income.  Patient's daughter will review finances and return my call later today regarding application.  I also discussed cost-savings with using 90 day supply (only charged 2 co-pays).    Plan: I will await call back from [name]'s daughter to see if she would like to apply for Extra Help.    Ralene Bathe, PharmD, [place] [phone]

## 2017-03-19 NOTE — Patient Outreach (Signed)
[facility]) Care Management   03/19/2017  Angela Simmons 08/19/1945 [id number]  Angela Simmons is an 72 y.o. female  Subjective: Initial home visit with pt, HIPAA verified, pt reports Hgb AIC is 6.4 and "I'm not worried about my blood sugar, the readings are good, just that it drops low sometimes even though I'm not on medicine for diabetes"  Pt states she was taken off metformin 02/25/17. Pt states she is out of plaquenil because she cannot afford it.  Pt states she would like to lose approximately 25 pounds.  Objective:   Vitals:   03/18/17 1302  BP: 120/62  Pulse: 70  Resp: 18  SpO2: 97%  Weight: 198 lb (89.8 kg)  Height: 1.753 m (5' 9" )  CBG today 89 Pt has had 3 hypoglycemic episodes since being taken off metformin 02/25/17- with all 3 readings in 60's range.   Fasting CBG ranges from log 68-87 ROS  Physical Exam  Constitutional: She is oriented to person, place, and time. She appears well-developed and well-nourished.  HENT:  Head: Normocephalic.  Neck: Normal range of motion. Neck supple.  Cardiovascular: Normal rate.  Respiratory: Effort normal and breath sounds normal.  GI: Soft. Bowel sounds are normal.  Musculoskeletal: Normal range of motion. She exhibits no edema.  Neurological: She is alert and oriented to person, place, and time.  Skin: Skin is warm and dry.  Psychiatric: She has a normal mood and affect. Her behavior is normal. Thought content normal.    Encounter Medications:   Outpatient Encounter Medications as of 03/18/2017  Medication Sig  . amLODipine (NORVASC) 5 MG tablet Take 1 tablet (5 mg total) by mouth daily.  Marland Kitchen aspirin (ADULT ASPIRIN EC LOW STRENGTH) 81 MG EC tablet Take 81 mg by mouth daily.   . benazepril (LOTENSIN) 40 MG tablet TAKE 1 TABLET BY MOUTH DAILY  . blood glucose meter kit and supplies KIT Dispense based on patient and insurance preference. Test blood sugars one time daily  . calcium-vitamin D (OSCAL WITH D) 500-200  MG-UNIT tablet Take 1 tablet by mouth daily with breakfast.  . cloNIDine (CATAPRES) 0.1 MG tablet Take 1 tablet (0.1 mg total) by mouth 2 (two) times daily. (Patient taking differently: Take 0.1 mg by mouth 2 (two) times daily. )  . glucose blood (FORA V30A BLOOD GLUCOSE TEST) test strip Once daily testing  . Lancets (UNILET EXCELITE II) MISC TEST ONCE DAILY  . loratadine (CLARITIN) 10 MG tablet TAKE 1 TABLET BY MOUTH EVERY DAY (Patient taking differently: TAKE 1 TABLET BY MOUTH EVERY DAY PRN)  . lovastatin (MEVACOR) 40 MG tablet TAKE 1 TABLET BY MOUTH DAILY  . Multiple Vitamin (MULTIVITAMIN) tablet Take 1 tablet by mouth daily.  . Omega-3 Fatty Acids (FISH OIL) 1000 MG CAPS Take 1 capsule by mouth daily.   . pantoprazole (PROTONIX) 40 MG tablet TAKE 1 TABLET BY MOUTH DAILY  . predniSONE (DELTASONE) 1 MG tablet Take 1 tablet (1 mg total) by mouth daily with breakfast.  . fluticasone (FLONASE) 50 MCG/ACT nasal spray USE 2 SPRAYS IN EACH NOSTRIL EVERY DAY (Patient not taking: Reported on 03/18/2017)  . ranitidine (ZANTAC) 150 MG tablet Take 150 mg by mouth at bedtime.  . [DISCONTINUED] hydroxychloroquine (PLAQUENIL) 200 MG tablet TAKE 1 TABLET BY MOUTH TWICE DAILY (Patient not taking: Reported on 03/18/2017)   No facility-administered encounter medications on file as of 03/18/2017.     Functional Status:   In your present state of health, do you  have any difficulty performing the following activities: 03/18/2017 02/25/2017  Hearing? N N  Vision? N N  Difficulty concentrating or making decisions? N N  Walking or climbing stairs? N N  Dressing or bathing? N N  Doing errands, shopping? N N  Preparing Food and eating ? N N  Using the Toilet? N N  In the past six months, have you accidently leaked urine? Y Y  Comment at times per pt intermittent, kegel exercise print out provided  Do you have problems with loss of bowel control? N N  Managing your Medications? N N  Managing your Finances? N N   Housekeeping or managing your Housekeeping? N N  Some recent data might be hidden    Fall/Depression Screening:    Fall Risk  03/18/2017 02/25/2017 02/21/2016  Falls in the past year? No No Yes  Number falls in past yr: - - 1   PHQ 2/9 Scores 03/18/2017 02/25/2017 02/21/2016 08/02/2015 11/23/2013 11/14/2012  PHQ - 2 Score 0 0 0 0 2 0  PHQ- 9 Score - - - - 7 -    Assessment:  [name] observed medication bottles and reviewed with pt, [facility] pharmacist involved due to cost of plaquenil.  See care plan- emphasis on hypoglycemia, diet, weight reduction.  RN faxed initial home visit, barrier letter to primary MD Billey Gosling.  THN CM Care Plan Problem One     Most Recent Value  Care Plan Problem One  Knowledge deficit related to hypoglycemia  Role Documenting the Problem One  Care Management [place] for Problem One  Active  THN Long Term Goal   Pt will have CBG readings within normal limits and decrease in hypoglycemic episodes within 60 days  THN Long Term Goal Start Date  03/19/17  Interventions for Problem One Long Term Goal  [name] gave "Living Better with diabetes" booklet and reviewed with pt, gave [facility] calendar and reviewed resources  [facility] CM Short Term Goal #1   Pt will verbalize signs/ symptoms hypoglycemia and actions to take within 30 days  THN CM Short Term Goal #1 Start Date  03/19/17  Interventions for Short Term Goal #1  [name] reviewed from materials given- signs/ symptoms of hypoglycemia and actions to take, [name] ask pt to eat 3 meals daily plus snacks, reviewed how sugary foods can cause spike in CBG followed by hypoglycemia.  THN CM Short Term Goal #2   Pt will lose 3-4 pounds within one month  THN CM Short Term Goal #2 Start Date  03/19/17  Interventions for Short Term Goal #2  [name] reviewed portion control, choosing fresh and frozen when possible, importance of walking daily      Plan: see pt for home visit in March Assess CBG log Assess weight  Jacqlyn Larsen [facility],  [place] [phone]

## 2017-03-29 ENCOUNTER — Other Ambulatory Visit: Payer: Self-pay | Admitting: Internal Medicine

## 2017-03-30 ENCOUNTER — Other Ambulatory Visit: Payer: Self-pay | Admitting: Pharmacist

## 2017-03-30 ENCOUNTER — Ambulatory Visit: Payer: Self-pay | Admitting: Pharmacist

## 2017-03-30 NOTE — Patient Outreach (Signed)
[facility]) Care Management  03/30/2017  ALAYA IVERSON [birth year] [id number]  Outreach call placed to Ms. Dumont's daughter regarding medication assistance. Daughter is busy and reports she will return my call in a few minutes.   Plan: I will call patient's daughter again tomorrow if I do not hear back today.   Ralene Bathe, PharmD, [place] [phone]

## 2017-03-31 ENCOUNTER — Ambulatory Visit: Payer: Self-pay | Admitting: Pharmacist

## 2017-03-31 ENCOUNTER — Other Ambulatory Visit: Payer: Self-pay | Admitting: Pharmacist

## 2017-03-31 NOTE — Patient Outreach (Addendum)
[place] [facility]) Care Management  03/31/2017  EDWYNA DANGERFIELD Jul 28, 1945 [id number]  Unsuccessful call attempt #2 to Ms. Madore's daughter regarding medication assistance.  There was no answer on the mobile phone and voicemail was full and could not accept messages.     Plan: I will send patient a letter describing [place] [facility] services and will make a 3rd call attempt in 10 business days.  If no response at that time, I will close patient's case.   Ralene Bathe, PharmD, [place] [phone]    Addendum: Incoming call from Ms. Potenza's daughter.  She reports that patient and spouse have combined monthly income of $2155 which is over the [employer] LIS income requirements.  I reviewed that patient is not eligible but may have cost savings via HTA with 3 month supply vs 1 month supply.  Daughter voiced understanding.  I provided my phone number if patient or family has questions in the future.    Plan: I will close patient case at this time, send MD case closure letter, and alert [facility] CMA.   Ralene Bathe, PharmD, [place] [phone]

## 2017-04-02 ENCOUNTER — Other Ambulatory Visit: Payer: Self-pay | Admitting: Rheumatology

## 2017-04-02 ENCOUNTER — Telehealth: Payer: Self-pay | Admitting: Rheumatology

## 2017-04-02 NOTE — Telephone Encounter (Signed)
Prescription sent to the pharmacy.

## 2017-04-02 NOTE — Telephone Encounter (Signed)
Last Visit: 12/31/17 Next Visit: 06/02/17  Okay to refill per Dr. Estanislado Pandy

## 2017-04-02 NOTE — Telephone Encounter (Signed)
Patient called requesting prescription refill of Prednisone to be sent to [facility] on [address]

## 2017-04-04 ENCOUNTER — Other Ambulatory Visit: Payer: Self-pay | Admitting: Internal Medicine

## 2017-04-05 ENCOUNTER — Telehealth: Payer: Self-pay | Admitting: Internal Medicine

## 2017-04-05 MED ORDER — UNILET EXCELITE II MISC: 0 refills | Status: DC

## 2017-04-05 MED ORDER — GLUCOSE BLOOD VI STRP: ORAL_STRIP | 0 refills | Status: DC

## 2017-04-05 NOTE — Telephone Encounter (Signed)
Reviewed chart pt is up-to-date sent refills to [facility].../lmb  

## 2017-04-05 NOTE — Telephone Encounter (Signed)
Patient needs refills:  Lancets-Unilet Excelite 11 Fora V30A test strips  PCP Dr. Quay Burow LOV 02/25/17 [facility] [place], [place]

## 2017-04-05 NOTE — Telephone Encounter (Signed)
See request from pharmacy. A prescription

## 2017-04-05 NOTE — Addendum Note (Signed)
Addended by: Earnstine Regal on: 04/05/2017 01:23 PM   Modules accepted: Orders

## 2017-04-05 NOTE — Telephone Encounter (Signed)
Copied from [place] [phone]. Topic: Quick Communication - See Telephone Encounter >> Apr 05, 2017  8:48 AM Bea Graff, NT wrote: CRM for notification. See Telephone encounter for: [facility] calling and states they did not need a rx for the glucose monitoring system, this was filled last month. They need and rx for the test strips and an rx for the lancets. CB#: [phone]  04/05/17.

## 2017-04-09 ENCOUNTER — Other Ambulatory Visit: Payer: Self-pay | Admitting: Internal Medicine

## 2017-04-13 ENCOUNTER — Ambulatory Visit: Payer: Self-pay | Admitting: Pharmacist

## 2017-04-14 ENCOUNTER — Encounter: Payer: Self-pay | Admitting: *Deleted

## 2017-04-14 ENCOUNTER — Other Ambulatory Visit: Payer: Self-pay | Admitting: *Deleted

## 2017-04-14 NOTE — Patient Outreach (Signed)
[name] drove to patient's home for scheduled visit (called before visit to remind pt and no answer to telephone), no answer to locked door and no answer to telephone.   Pt called [name] back and states she was at [facility] for exercise.  Pt agreeable to telephonic assessment as pt is busy and attending multiple appointments, exercise classes 3 x week.  Pt states "blood sugar has been good, no low readings"  Pt reports she has all medications including plaquenil.  Pt wants to continue working towards weight loss goal and feels that exercising is helping.  THN CM Care Plan Problem One     Most Recent Value  Care Plan Problem One  Knowledge deficit related to hypoglycemia  Role Documenting the Problem One  Care Management [place] for Problem One  Active  THN Long Term Goal   Pt will have CBG readings within normal limits and decrease in hypoglycemic episodes within 60 days  THN Long Term Goal Start Date  03/19/17  Interventions for Problem One Long Term Goal  Pt has had no hypoglycemic episodes, CBG ranges 70-90's per pt, pt wants to continue working towards this goal and feels "things are improving"  THN CM Short Term Goal #1   Pt will verbalize signs/ symptoms hypoglycemia and actions to take within 30 days  THN CM Short Term Goal #1 Start Date  04/14/17 Barrie Folk re-established]  Interventions for Short Term Goal #1  [name] reinforced signs/ symptoms hypoglycemia and actions to take, pt requests reinforcement on symptoms  THN CM Short Term Goal #2   Pt will lose 3-4 pounds within one month  THN CM Short Term Goal #2 Start Date  04/14/17 Barrie Folk re-established]  Interventions for Short Term Goal #2  Pt reports she is now attending [employer] 3 x week and recently started exercise routine, pt wants to continue working towards goal of losing weight      PLAN Outreach pt next month for telephone assessment Ask about weight, continued exercising Assess CBG  Angela Simmons [facility], [place] [phone]

## 2017-04-17 ENCOUNTER — Encounter: Payer: Self-pay | Admitting: Critical Care Medicine

## 2017-04-22 NOTE — Progress Notes (Signed)
Subjective:    Patient ID: Angela Simmons, female    DOB: 03-24-1945, 72 y.o.   MRN: [id number]  HPI The patient is here for an acute visit.   Rash:  She has a rash for about one month on both legs.  The rash on on the left anterior leg > right anterior leg.  It itched bad initially and burned.  She tried vaseline and coconut oil on it.  She put hydrocortisone cream 1% and it helped.  It is less itchy and Dayanara Sherrill less, but is persistent.  She denies new products and denies any new exposures.  She is unsure what the cause is.   She denies other cold symptoms or allergy symptoms.    Medications and allergies reviewed with patient and updated if appropriate.  Patient Active Problem List   Diagnosis Date Noted  . Sicca syndrome (HCC) 06/23/2016  . Bilateral leg edema 04/07/2016  . Burning sensation of skin 04/07/2016  . PMR (polymyalgia rheumatica) (HCC) 03/27/2016  . Osteopenia 03/04/2016  . High risk medication use 11/21/2015  . Autoimmune disease (HCC) 03/18/2015  . Inflammatory arthritis 03/18/2015  . ANA positive 12/02/2014  . Joint pain 11/22/2014  . Overweight (BMI 25.0-29.9) 08/18/2014  . Low back pain with radiation 08/18/2014  . Vulvar lesion 06/28/2014  . Hyperlipidemia 06/28/2014  . Chest pain, atypical 04/11/2014  . Tendonitis of shoulder 08/29/2013  . S/P right mastectomy 11/14/2012  . Type 2 diabetes, controlled, with neuropathy (HCC) 10/05/2012  . Allergic rhinitis 01/26/2011  . GERD 11/10/2009  . INTERSTITIAL CYSTITIS 10/31/2008  . BENIGN NEOPLASM OF VULVA 02/01/2008  . Essential hypertension, benign 02/08/2007  . History of cancer of right breast 02/08/2007    Current Outpatient Medications on File Prior to Visit  Medication Sig Dispense Refill  . amLODipine (NORVASC) 5 MG tablet TAKE 1 TABLET BY MOUTH EVERY DAY 90 tablet 3  . aspirin (ADULT ASPIRIN EC LOW STRENGTH) 81 MG EC tablet Take 81 mg by mouth daily.     . benazepril (LOTENSIN) 40 MG tablet TAKE  ONE TABLET BY MOUTH EVERY DAY 90 tablet 1  . Blood Glucose Monitoring Suppl (ONETOUCH VERIO) w/Device KIT USE TO TEST BLOOD SUGAR ONCE DAILY AS DIRECTED 1 kit 0  . calcium-vitamin D (OSCAL WITH D) 500-200 MG-UNIT tablet Take 1 tablet by mouth daily with breakfast.    . cloNIDine (CATAPRES) 0.1 MG tablet TAKE ONE TABLET BY MOUTH TWICE DAILY 180 tablet 1  . fluticasone (FLONASE) 50 MCG/ACT nasal spray USE 2 SPRAYS IN EACH NOSTRIL EVERY DAY 48 g 0  . glucose blood (FORA V30A BLOOD GLUCOSE TEST) test strip Once daily testing 100 each 0  . hydroxychloroquine (PLAQUENIL) 200 MG tablet Take 1 tablet (200 mg total) by mouth 2 (two) times daily. 180 tablet 1  . Lancets (UNILET EXCELITE II) MISC TEST ONCE DAILY 100 each 0  . loratadine (CLARITIN) 10 MG tablet TAKE 1 TABLET BY MOUTH EVERY DAY (Patient taking differently: TAKE 1 TABLET BY MOUTH EVERY DAY PRN) 30 tablet 2  . lovastatin (MEVACOR) 40 MG tablet TAKE ONE TABLET BY MOUTH EVERY DAY 90 tablet 1  . Multiple Vitamin (MULTIVITAMIN) tablet Take 1 tablet by mouth daily.    Marland Kitchen omega-3 acid ethyl esters (LOVAZA) 1 g capsule TAKE TWO CAPSULES BY MOUTH TWICE DAILY 120 capsule 5  . Omega-3 Fatty Acids (FISH OIL) 1000 MG CAPS Take 1 capsule by mouth daily.     . pantoprazole (PROTONIX) 40 MG tablet  TAKE ONE TABLET BY MOUTH EVERY DAY 90 tablet 1  . predniSONE (DELTASONE) 1 MG tablet USE AS DIRECTED (SEE TAPER SCHEDULE GIVEN IN OFFICE. DECREASE BY 1 MG DAILY EACH MONTH) 360 tablet 0  . ranitidine (ZANTAC) 150 MG tablet Take 150 mg by mouth at bedtime.     No current facility-administered medications on file prior to visit.     Past Medical History:  Diagnosis Date  . Breast cancer (HCC) 1990s   right breast  . Diabetes mellitus without complication (HCC)   . GERD (gastroesophageal reflux disease)   . HTN (hypertension)   . Hypercholesterolemia   . Obesity     Past Surgical History:  Procedure Laterality Date  . ABDOM[place]  . COLONOSCOPY  May 2003   Dr. Katrinka Blazing: normal  . COLONOSCOPY  06/22/2011   Procedure: COLONOSCOPY;  Surgeon: West Bali, MD;  Location: [facility];  Service: Endoscopy;  Laterality: N/A;  10:30  . MASTECTOMY  1995   right     Social History   Socioeconomic History  . Marital status: Married    Spouse name: Not on file  . Number of children: 3  . Years of education: 7  . Highest education level: Not on file  Occupational History  . Not on file  Social Needs  . Financial resource strain: Not on file  . Food insecurity:    Worry: Not on file    Inability: Not on file  . Transportation needs:    Medical: Not on file    Non-medical: Not on file  Tobacco Use  . Smoking status: Never Smoker  . Smokeless tobacco: Never Used  Substance and Sexual Activity  . Alcohol use: No  . Drug use: No  . Sexual activity: Never    Birth control/protection: Rhythm  Lifestyle  . Physical activity:    Days per week: Not on file    Minutes per session: Not on file  . Stress: Not on file  Relationships  . Social connections:    Talks on phone: Not on file    Gets together: Not on file    Attends religious service: Not on file    Active member of club or organization: Not on file    Attends meetings of clubs or organizations: Not on file    Relationship status: Not on file  Other Topics Concern  . Not on file  Social History Narrative   Lives at home with husband   Caffeine, maybe 1 a day    Family History  Problem Relation Age of Onset  . Diabetes Mother        diabetic coma  . Heart failure Mother   . Diabetes Father   . Emphysema Father   . Diabetes Sister   . Hypercholesterolemia Sister   . Hypercholesterolemia Brother   . Diabetes Brother   . Hypertension Brother   . Hypercholesterolemia Brother   . Diabetes Sister   . Hypertension Sister   . Hypercholesterolemia Sister   . Hypercholesterolemia Sister   . Hypercholesterolemia Sister   . Diabetes Brother    . Hypertension Brother   . Colon cancer Neg Hx     Review of Systems  Constitutional: Negative for chills and fever.  HENT: Negative for sore throat (yesterday only) and trouble swallowing.   Respiratory: Negative for cough, shortness of breath and wheezing.   Skin: Positive for rash. Negative for wound.  Neurological: Positive for  headaches (occ). Negative for light-headedness.       Objective:   Vitals:   04/23/17 0833  BP: 130/82  Pulse: 84  Resp: 16  Temp: 97.7 F (36.5 C)  SpO2: 99%   BP Readings from Last 3 Encounters:  04/23/17 130/82  03/18/17 120/62  03/01/17 (!) 159/89   Wt Readings from Last 3 Encounters:  04/23/17 201 lb (91.2 kg)  03/18/17 198 lb (89.8 kg)  03/01/17 198 lb 12.8 oz (90.2 kg)   Body mass index is 29.68 kg/m.   Physical Exam  Constitutional: She appears well-developed and well-nourished. No distress.  HENT:  Head: Normocephalic and atraumatic.  Pulmonary/Chest: No respiratory distress.  Musculoskeletal: She exhibits no edema.  Skin: Skin is warm and dry. Rash (b/l anterior shins with areas of hyperpigmentation w/o papules or blisters; left lower anterior leg area of the size of an orange that is flat, mildly erythematous,dry - no papules or blisters) noted. She is not diaphoretic.           Assessment & Plan:    See Problem List for Assessment and Plan of chronic medical problems.

## 2017-04-23 ENCOUNTER — Encounter: Payer: Self-pay | Admitting: Internal Medicine

## 2017-04-23 ENCOUNTER — Ambulatory Visit (INDEPENDENT_AMBULATORY_CARE_PROVIDER_SITE_OTHER): Payer: PPO | Admitting: Internal Medicine

## 2017-04-23 VITALS — BP 130/82 | HR 84 | Temp 97.7°F | Resp 16 | Wt 201.0 lb

## 2017-04-23 DIAGNOSIS — R21 Rash and other nonspecific skin eruption: Secondary | ICD-10-CM | POA: Diagnosis not present

## 2017-04-23 MED ORDER — TRIAMCINOLONE ACETONIDE 0.5 % EX OINT: 1.0000 "application " | TOPICAL_OINTMENT | Freq: Two times a day (BID) | CUTANEOUS | 0 refills | Status: DC

## 2017-04-23 NOTE — Patient Instructions (Signed)
Apply the steroid cream twice daily to the rash for up to 14 days.  Apply vaseline or lotion on top.   Call if no improvement, I can refer you to a dermatologist if needed.    Rash A rash is a change in the color of the skin. A rash can also change the way your skin feels. There are many different conditions and factors that can cause a rash. Follow these instructions at home: Pay attention to any changes in your symptoms. Follow these instructions to help with your condition: Medicine Take or apply over-the-counter and prescription medicines only as told by your health care provider. These may include:  Corticosteroid cream.  Anti-itch lotions.  Oral antihistamines.  Skin Care  Apply cool compresses to the affected areas.  Try taking a bath with: ? Epsom salts. Follow the instructions on the packaging. You can get these at your local pharmacy or grocery store. ? Baking soda. Pour a small amount into the bath as told by your health care provider. ? Colloidal oatmeal. Follow the instructions on the packaging. You can get this at your local pharmacy or grocery store.  Try applying baking soda paste to your skin. Stir water into baking soda until it reaches a paste-like consistency.  Do not scratch or rub your skin.  Avoid covering the rash. Make sure the rash is exposed to air as much as possible. General instructions  Avoid hot showers or baths, which can make itching worse. A cold shower may help.  Avoid scented soaps, detergents, and perfumes. Use gentle soaps, detergents, perfumes, and other cosmetic products.  Avoid any substance that causes your rash. Keep a journal to help track what causes your rash. Write down: ? What you eat. ? What cosmetic products you use. ? What you drink. ? What you wear. This includes jewelry.  Keep all follow-up visits as told by your health care provider. This is important. Contact a health care provider if:  You sweat at night.  You  lose weight.  You urinate more than normal.  You feel weak.  You vomit.  Your skin or the whites of your eyes look yellow (jaundice).  Your skin: ? Tingles. ? Is numb.  Your rash: ? Does not go away after several days. ? Gets worse.  You are: ? Unusually thirsty. ? More tired than normal.  You have: ? New symptoms. ? Pain in your abdomen. ? A fever. ? Diarrhea. Get help right away if:  You develop a rash that covers all or most of your body. The rash may or may not be painful.  You develop blisters that: ? Are on top of the rash. ? Grow larger or grow together. ? Are painful. ? Are inside your nose or mouth.  You develop a rash that: ? Looks like purple pinprick-sized spots all over your body. ? Has a "bull's eye" or looks like a target. ? Is not related to sun exposure, is red and painful, and causes your skin to peel. This information is not intended to replace advice given to you by your health care provider. Make sure you discuss any questions you have with your health care provider. Document Released: 12/26/2001 Document Revised: 06/11/2015 Document Reviewed: 05/23/2014 Elsevier Interactive Patient Education  2018 [employer].

## 2017-04-23 NOTE — Assessment & Plan Note (Signed)
Non-specific rash - ? Allergic No other concerning symptoms Triamcinolone cream bid for up to 14 days - apply lotion or vaseline topically as least twice a day Call if no improvement - can consider derm referral

## 2017-04-29 ENCOUNTER — Other Ambulatory Visit: Payer: Self-pay | Admitting: Internal Medicine

## 2017-04-29 DIAGNOSIS — H2513 Age-related nuclear cataract, bilateral: Secondary | ICD-10-CM | POA: Diagnosis not present

## 2017-05-10 ENCOUNTER — Encounter: Payer: Self-pay | Admitting: *Deleted

## 2017-05-10 ENCOUNTER — Other Ambulatory Visit: Payer: Self-pay | Admitting: *Deleted

## 2017-05-10 NOTE — Patient Outreach (Signed)
Telephone call to pt for phone assessment, spoke with pt, HIPAA verified, pt reports she is doing well, continues to exercise and walk, blood sugar within normal limits per pt, eating well, has all medication and taking as prescribed, no further needs identified.  Case closed, [name] faxed case closure letter to primary MD and mailed case closure letter to patient's home.  THN CM Care Plan Problem One     Most Recent Value  Care Plan Problem One  Knowledge deficit related to hypoglycemia  Role Documenting the Problem One  Care Management [place] for Problem One  Active  THN Long Term Goal   Pt will have CBG readings within normal limits and decrease in hypoglycemic episodes within 60 days  THN Long Term Goal Start Date  03/19/17  [facility] Long Term Goal Met Date  05/10/17  Interventions for Problem One Long Term Goal  [name] reviewed CBG readings over the phone with pt with ranges 79-93, denies any hypoglycemic episodes  THN CM Short Term Goal #1   Pt will verbalize signs/ symptoms hypoglycemia and actions to take within 30 days  THN CM Short Term Goal #1 Start Date  04/14/17 [goal re-established]  THN CM Short Term Goal #1 Met Date  05/10/17  Interventions for Short Term Goal #1  [name] reinforced signs/ symptoms hypoglycemia and actions to take  THN CM Short Term Goal #2   Pt will lose 3-4 pounds within one month  THN CM Short Term Goal #2 Start Date  04/14/17 [goal re-established]  THN CM Short Term Goal #2 Met Date  05/10/17  Interventions for Short Term Goal #2  Pt reports she may have lost 1-2 pounds and continues exercising at [facility] and walking, pt states she has decided not to weigh daily      Case closed  Jacqlyn Larsen [facility], BSN [place] Coordinator [phone]

## 2017-05-19 NOTE — Progress Notes (Signed)
Office Visit Note  Patient: Angela Simmons             Date of Birth: August 27, 1945           MRN: [id number]             PCP: Binnie Rail, MD Referring: Binnie Rail, MD Visit Date: 06/02/2017 Occupation: @GUAROCC @    Subjective:  Medication Management   History of Present Illness: Angela Simmons is a 72 y.o. female with history of polymyalgia rheumatica and autoimmune disease.  She states she tapered off prednisone and completely came off the medication in the beginning of May.  She continues to take Plaquenil.  She states she has been having increased lower back pain and hip pain lately.  There is no increased muscle weakness.  Is noticed some pedal edema.  Activities of Daily Living:  Patient reports morning stiffness for a couple hours.   Patient Reports nocturnal pain.  Difficulty dressing/grooming: Denies Difficulty climbing stairs: Reports Difficulty getting out of chair: Reports Difficulty using hands for taps, buttons, cutlery, and/or writing: Reports   Review of Systems  Constitutional: Positive for fatigue. Negative for night sweats, weight gain and weight loss.  HENT: Positive for mouth dryness. Negative for mouth sores, trouble swallowing, trouble swallowing and nose dryness.   Eyes: Positive for dryness. Negative for pain, redness and visual disturbance.  Respiratory: Negative for cough, shortness of breath and difficulty breathing.   Cardiovascular: Positive for swelling in legs/feet. Negative for chest pain, palpitations, hypertension and irregular heartbeat.  Gastrointestinal: Negative for abdominal pain, blood in stool, constipation, diarrhea, nausea and vomiting.  Endocrine: Negative for increased urination.  Genitourinary: Negative for pelvic pain and vaginal dryness.  Musculoskeletal: Positive for arthralgias, joint pain and morning stiffness. Negative for joint swelling, myalgias, muscle weakness, muscle tenderness and myalgias.  Skin: Negative for  color change, rash, hair loss, skin tightness, ulcers and sensitivity to sunlight.  Allergic/Immunologic: Negative for susceptible to infections.  Neurological: Negative for dizziness, headaches, memory loss, night sweats and weakness.  Hematological: Negative for bruising/bleeding tendency and swollen glands.  Psychiatric/Behavioral: Negative for depressed mood, confusion and sleep disturbance. The patient is not nervous/anxious.     PMFS History:  Patient Active Problem List   Diagnosis Date Noted  . Ischial bursitis of left side 06/02/2017  . Rash and nonspecific skin eruption 04/23/2017  . Sicca syndrome ([place]) 06/23/2016  . Bilateral leg edema 04/07/2016  . Burning sensation of skin 04/07/2016  . PMR (polymyalgia rheumatica) (HCC) 03/27/2016  . Osteopenia 03/04/2016  . High risk medication use 11/21/2015  . Autoimmune disease ([place]) 03/18/2015  . Inflammatory arthritis 03/18/2015  . ANA positive 12/02/2014  . Joint pain 11/22/2014  . Overweight (BMI 25.0-29.9) 08/18/2014  . Low back pain with radiation 08/18/2014  . Vulvar lesion 06/28/2014  . Hyperlipidemia 06/28/2014  . Chest pain, atypical 04/11/2014  . Tendonitis of shoulder 08/29/2013  . S/P right mastectomy 11/14/2012  . Type 2 diabetes, controlled, with neuropathy ([place]) 10/05/2012  . Allergic rhinitis 01/26/2011  . GERD 11/10/2009  . INTERSTITIAL CYSTITIS 10/31/2008  . BENIGN NEOPLASM OF VULVA 02/01/2008  . Essential hypertension, benign 02/08/2007  . History of cancer of right breast 02/08/2007    Past Medical History:  Diagnosis Date  . Breast cancer ([place]) 1990s   right breast  . Diabetes mellitus without complication ([place])   . GERD (gastroesophageal reflux disease)   . HTN (hypertension)   . Hypercholesterolemia   . Obesity  Family History  Problem Relation Age of Onset  . Diabetes Mother        diabetic coma  . Heart failure Mother   . Diabetes Father   . Emphysema Father   . Diabetes Sister   .  Hypercholesterolemia Sister   . Hypercholesterolemia Brother   . Diabetes Brother   . Hypertension Brother   . Hypercholesterolemia Brother   . Diabetes Sister   . Hypertension Sister   . Hypercholesterolemia Sister   . Hypercholesterolemia Sister   . Hypercholesterolemia Sister   . Diabetes Brother   . Hypertension Brother   . Colon cancer Neg Hx    Past Surgical History:  Procedure Laterality Date  . ABDOM[place]  [place]  . COLONOSCOPY  May 2003   Dr. Tamala Julian: normal  . COLONOSCOPY  06/22/2011   Procedure: COLONOSCOPY;  Surgeon: Danie Binder, MD;  [facility];  Service: Endoscopy;  Laterality: N/A;  10:30  . MASTECTOMY  1995   right    Social History   Social History Narrative   Lives at home with husband   Caffeine, maybe 1 a day     Objective: Vital Signs: BP (!) 159/89 (BP Location: Left Arm, Patient Position: Sitting, Cuff Size: Large)   Pulse 70   Resp 15   Ht 5' 9.5" (1.765 m)   Wt 204 lb (92.5 kg)   BMI 29.69 kg/m    Physical Exam  Constitutional: She is oriented to person, place, and time. She appears well-developed and well-nourished.  HENT:  Head: Normocephalic and atraumatic.  Eyes: Conjunctivae and EOM are normal.  Neck: Normal range of motion.  Cardiovascular: Normal rate, regular rhythm, normal heart sounds and intact distal pulses.  Pulmonary/Chest: Effort normal and breath sounds normal.  Abdominal: Soft. Bowel sounds are normal.  Lymphadenopathy:    She has no cervical adenopathy.  Neurological: She is alert and oriented to person, place, and time.  Skin: Skin is warm and dry. Capillary refill takes less than 2 seconds.  Psychiatric: She has a normal mood and affect. Her behavior is normal.  Nursing note and vitals reviewed.    Musculoskeletal Exam: C-spine thoracic lumbar spine good range of motion.  Shoulder joints elbow joints wrist joint MCPs PIPs DIPs been good range of motion with no synovitis.  Hip  joints knee joints ankles MTPs PIPs been good range of motion.  She had tenderness over left ischial bursa consistent with history of bursitis.  No muscular weakness or tenderness was noted.  CDAI Exam: No CDAI exam completed.    Investigation: No additional findings.PLQ eye exam: 12/25/2016 CBC Latest Ref Rng & Units 12/31/2016 06/30/2016 02/26/2016  WBC 3.8 - 10.8 Thousand/uL 4.2 4.2 4.3  Hemoglobin 11.7 - 15.5 g/dL 12.5 12.3 12.5  Hematocrit 35.0 - 45.0 % 38.1 37.4 38.3  Platelets 140 - 400 Thousand/uL 268 269 245.0   CMP Latest Ref Rng & Units 02/25/2017 12/31/2016 08/25/2016  Glucose 70 - 99 mg/dL 76 98 95  BUN 6 - 23 mg/dL 16 15 11   Creatinine 0.40 - 1.20 mg/dL 1.07 0.99(H) 0.88  Sodium 135 - 145 mEq/L 140 141 141  Potassium 3.5 - 5.1 mEq/L 4.0 4.1 4.1  Chloride 96 - 112 mEq/L 105 104 103  CO2 19 - 32 mEq/L 30 29 30   Calcium 8.4 - 10.5 mg/dL 9.0 9.4 9.6  Total Protein 6.0 - 8.3 g/dL 7.2 7.1 7.9  Total Bilirubin 0.2 - 1.2 mg/dL 0.4 0.3  0.4  Alkaline Phos 39 - 117 U/L 53 - 57  AST 0 - 37 U/L 12 11 12   ALT 0 - 35 U/L 11 9 10     Imaging: No results found.  Speciality Comments: PLQ EYe Exam: 12/25/16 WNL @ [facility] follow up in 6 months.     Procedures:  No procedures performed Allergies: Levofloxacin; Nitrofurantoin; Penicillins; and Sulfonamide derivatives   Assessment / Plan:     Visit Diagnoses: PMR (polymyalgia rheumatica) (HCC) -she does not have any increased muscle weakness or tenderness although she has been having intermittent myalgias.  She recently came off prednisone this month.  Plan: Sedimentation rate  Autoimmune disease ([place]) - Positive ANA, sicca symptoms  Sicca syndrome (HCC)-she continues to have some sicca symptoms for which over-the-counter products were discussed.  I will refill her Plaquenil prescription today.  She states she ran out of Plaquenil a week ago.  High risk medication use - PLQ 200 mg bid , prednisone tapered off and Mayeye exam:  12/25/2016 - Plan: CBC with Differential/Platelet, COMPLETE METABOLIC PANEL WITH GFR  Ischial bursitis of left side-stretching exercises for shoulder bursitis were discussed.  She was also advised to sit on a padded seat.  I offered physical therapy which she declined.  Osteopenia of multiple sites-use of calcium vitamin D and resistive exercises were discussed.  Other medical problems are listed as follows:  History of peripheral neuropathy  Other fatigue  History of cystitis  History of gastroesophageal reflux (GERD)  History of hyperlipidemia  History of hypertension  History of cancer - of right breast   History of obesity    Orders: Orders Placed This Encounter  Procedures  . CBC with Differential/Platelet  . COMPLETE METABOLIC PANEL WITH GFR  . Sedimentation rate   Meds ordered this encounter  Medications  . hydroxychloroquine (PLAQUENIL) 200 MG tablet    Sig: Take 1 tablet (200 mg total) by mouth 2 (two) times daily.    Dispense:  180 tablet    Refill:  1    Face-to-face time spent with patient was 30 minutes. 50% of time was spent in counseling and coordination of care.  Follow-Up Instructions: Return in about 5 months (around 11/02/2017) for PMR,, Autoimmune disease.   Bo Merino, MD  Note - This record has been created using Editor, commissioning.  Chart creation errors have been sought, but may not always  have been located. Such creation errors do not reflect on  the standard of medical care.

## 2017-05-27 DIAGNOSIS — B351 Tinea unguium: Secondary | ICD-10-CM | POA: Diagnosis not present

## 2017-05-27 DIAGNOSIS — L84 Corns and callosities: Secondary | ICD-10-CM | POA: Diagnosis not present

## 2017-05-27 DIAGNOSIS — M79676 Pain in unspecified toe(s): Secondary | ICD-10-CM | POA: Diagnosis not present

## 2017-05-27 DIAGNOSIS — E1142 Type 2 diabetes mellitus with diabetic polyneuropathy: Secondary | ICD-10-CM | POA: Diagnosis not present

## 2017-06-02 ENCOUNTER — Ambulatory Visit: Payer: PPO | Admitting: Rheumatology

## 2017-06-02 ENCOUNTER — Encounter: Payer: Self-pay | Admitting: Rheumatology

## 2017-06-02 VITALS — BP 159/89 | HR 70 | Resp 15 | Ht 69.5 in | Wt 204.0 lb

## 2017-06-02 DIAGNOSIS — Z8679 Personal history of other diseases of the circulatory system: Secondary | ICD-10-CM

## 2017-06-02 DIAGNOSIS — Z87440 Personal history of urinary (tract) infections: Secondary | ICD-10-CM | POA: Diagnosis not present

## 2017-06-02 DIAGNOSIS — Z8669 Personal history of other diseases of the nervous system and sense organs: Secondary | ICD-10-CM | POA: Diagnosis not present

## 2017-06-02 DIAGNOSIS — M8589 Other specified disorders of bone density and structure, multiple sites: Secondary | ICD-10-CM | POA: Diagnosis not present

## 2017-06-02 DIAGNOSIS — Z8639 Personal history of other endocrine, nutritional and metabolic disease: Secondary | ICD-10-CM

## 2017-06-02 DIAGNOSIS — M353 Polymyalgia rheumatica: Secondary | ICD-10-CM

## 2017-06-02 DIAGNOSIS — R5383 Other fatigue: Secondary | ICD-10-CM

## 2017-06-02 DIAGNOSIS — M359 Systemic involvement of connective tissue, unspecified: Secondary | ICD-10-CM

## 2017-06-02 DIAGNOSIS — M7072 Other bursitis of hip, left hip: Secondary | ICD-10-CM

## 2017-06-02 DIAGNOSIS — D8989 Other specified disorders involving the immune mechanism, not elsewhere classified: Secondary | ICD-10-CM | POA: Diagnosis not present

## 2017-06-02 DIAGNOSIS — M3500 Sicca syndrome, unspecified: Secondary | ICD-10-CM

## 2017-06-02 DIAGNOSIS — Z79899 Other long term (current) drug therapy: Secondary | ICD-10-CM | POA: Diagnosis not present

## 2017-06-02 DIAGNOSIS — Z8719 Personal history of other diseases of the digestive system: Secondary | ICD-10-CM | POA: Diagnosis not present

## 2017-06-02 DIAGNOSIS — Z859 Personal history of malignant neoplasm, unspecified: Secondary | ICD-10-CM

## 2017-06-02 LAB — SEDIMENTATION RATE: Sed Rate: 45 mm/h — ABNORMAL HIGH (ref 0–30)

## 2017-06-02 LAB — CBC WITH DIFFERENTIAL/PLATELET
BASOS PCT: 0.5 %
Basophils Absolute: 19 cells/uL (ref 0–200)
Eosinophils Absolute: 19 cells/uL (ref 15–500)
Eosinophils Relative: 0.5 %
HEMATOCRIT: 36.3 % (ref 35.0–45.0)
Hemoglobin: 12 g/dL (ref 11.7–15.5)
LYMPHS ABS: 901 {cells}/uL (ref 850–3900)
MCH: 29.1 pg (ref 27.0–33.0)
MCHC: 33.1 g/dL (ref 32.0–36.0)
MCV: 88.1 fL (ref 80.0–100.0)
MPV: 10.9 fL (ref 7.5–12.5)
Monocytes Relative: 8 %
NEUTROS ABS: 2557 {cells}/uL (ref 1500–7800)
Neutrophils Relative %: 67.3 %
PLATELETS: 242 10*3/uL (ref 140–400)
RBC: 4.12 10*6/uL (ref 3.80–5.10)
RDW: 13 % (ref 11.0–15.0)
TOTAL LYMPHOCYTE: 23.7 %
WBC: 3.8 10*3/uL (ref 3.8–10.8)
WBCMIX: 304 {cells}/uL (ref 200–950)

## 2017-06-02 LAB — COMPLETE METABOLIC PANEL WITH GFR
AG Ratio: 1.2 (calc) (ref 1.0–2.5)
ALT: 21 U/L (ref 6–29)
AST: 24 U/L (ref 10–35)
Albumin: 4 g/dL (ref 3.6–5.1)
Alkaline phosphatase (APISO): 61 U/L (ref 33–130)
BUN/Creatinine Ratio: 12 (calc) (ref 6–22)
BUN: 11 mg/dL (ref 7–25)
CALCIUM: 9.4 mg/dL (ref 8.6–10.4)
CO2: 29 mmol/L (ref 20–32)
Chloride: 106 mmol/L (ref 98–110)
Creat: 0.94 mg/dL — ABNORMAL HIGH (ref 0.60–0.93)
GFR, EST AFRICAN AMERICAN: 71 mL/min/{1.73_m2} (ref >=60)
GFR, EST NON AFRICAN AMERICAN: 61 mL/min/{1.73_m2} (ref >=60)
Globulin: 3.4 g/dL (calc) (ref 1.9–3.7)
Glucose, Bld: 81 mg/dL (ref 65–99)
POTASSIUM: 4.1 mmol/L (ref 3.5–5.3)
Sodium: 140 mmol/L (ref 135–146)
TOTAL PROTEIN: 7.4 g/dL (ref 6.1–8.1)
Total Bilirubin: 0.4 mg/dL (ref 0.2–1.2)

## 2017-06-02 MED ORDER — HYDROXYCHLOROQUINE SULFATE 200 MG PO TABS: 200.0000 mg | ORAL_TABLET | Freq: Two times a day (BID) | ORAL | 1 refills | Status: DC

## 2017-06-02 NOTE — Patient Instructions (Signed)
Iliotibial Band Syndrome Rehab  Ask your health care provider which exercises are safe for you. Do exercises exactly as told by your health care provider and adjust them as directed. It is normal to feel mild stretching, pulling, tightness, or discomfort as you do these exercises, but you should stop right away if you feel sudden pain or your pain gets worse. Do not begin these exercises until told by your health care provider.  Stretching and range of motion exercises  These exercises warm up your muscles and joints and improve the movement and flexibility of your hip and pelvis.  Exercise A: Quadriceps, prone    1. Lie on your abdomen on a firm surface, such as a bed or padded floor.  2. Bend your left / right knee and hold your ankle. If you cannot reach your ankle or pant leg, loop a belt around your foot and grab the belt instead.  3. Gently pull your heel toward your buttocks. Your knee should not slide out to the side. You should feel a stretch in the front of your thigh and knee.  4. Hold this position for __________ seconds.  Repeat __________ times. Complete this stretch __________ times a day.  Exercise B: Iliotibial band    1. Lie on your side with your left / right leg in the top position.  2. Bend both of your knees and grab your left / right ankle. Stretch out your bottom arm to help you balance.  3. Slowly bring your top knee back so your thigh goes behind your trunk.  4. Slowly lower your top leg toward the floor until you feel a gentle stretch on the outside of your left / right hip and thigh. If you do not feel a stretch and your knee will not fall farther, place the heel of your other foot on top of your knee and pull your knee down toward the floor with your foot.  5. Hold this position for __________ seconds.  Repeat __________ times. Complete this stretch __________ times a day.  Strengthening exercises  These exercises build strength and endurance in your hip and pelvis. Endurance is the  ability to use your muscles for a long time, even after they get tired.  Exercise C: Straight leg raises (  hip abductors)  1. Lie on your side with your left / right leg in the top position. Lie so your head, shoulder, knee, and hip line up. You may bend your bottom knee to help you balance.  2. Roll your hips slightly forward so your hips are stacked directly over each other and your left / right knee is facing forward.  3. Tense the muscles in your outer thigh and lift your top leg 4-6 inches (10-15 cm).  4. Hold this position for __________ seconds.  5. Slowly return to the starting position. Let your muscles relax completely before doing another repetition.  Repeat __________ times. Complete this exercise __________ times a day.  Exercise D: Straight leg raises (  hip extensors)  1. Lie on your abdomen on your bed or a firm surface. You can put a pillow under your hips if that is more comfortable.  2. Bend your left / right knee so your foot is straight up in the air.  3. Squeeze your buttock muscles and lift your left / right thigh off the bed. Do not let your back arch.  4. Tense this muscle as hard as you can without increasing any knee pain.    5. Hold this position for __________ seconds.  6. Slowly lower your leg to the starting position and allow it to relax completely.  Repeat __________ times. Complete this exercise __________ times a day.  Exercise E: Hip hike  1. Stand sideways on a bottom step. Stand on your left / right leg with your other foot unsupported next to the step. You can hold onto the railing or wall if needed for balance.  2. Keep your knees straight and your torso square. Then, lift your left / right hip up toward the ceiling.  3. Slowly let your left / right hip lower toward the floor, past the starting position. Your foot should get closer to the floor. Do not lean or bend your knees.  Repeat __________ times. Complete this exercise __________ times a day.  This information is not  intended to replace advice given to you by your health care provider. Make sure you discuss any questions you have with your health care provider.  Document Released: 01/05/2005 Document Revised: 09/10/2015 Document Reviewed: 12/07/2014  Elsevier Interactive Patient Education © 2018 [employer]

## 2017-06-03 ENCOUNTER — Ambulatory Visit (INDEPENDENT_AMBULATORY_CARE_PROVIDER_SITE_OTHER): Payer: PPO | Admitting: Family Medicine

## 2017-06-03 ENCOUNTER — Ambulatory Visit: Payer: Self-pay | Admitting: *Deleted

## 2017-06-03 ENCOUNTER — Encounter: Payer: Self-pay | Admitting: Family Medicine

## 2017-06-03 ENCOUNTER — Telehealth: Payer: Self-pay | Admitting: Emergency Medicine

## 2017-06-03 DIAGNOSIS — I10 Essential (primary) hypertension: Secondary | ICD-10-CM | POA: Diagnosis not present

## 2017-06-03 DIAGNOSIS — R600 Localized edema: Secondary | ICD-10-CM

## 2017-06-03 NOTE — Progress Notes (Signed)
Sed rate is mildly elevated.  Patient was asymptomatic.  We will monitor for right now.

## 2017-06-03 NOTE — Telephone Encounter (Signed)
Pt called due to swelling in her bilateral legs and fee and Dr Estanislado Pandy told he to see her PCP about that; she states her BP 159/89 yesterday; she had a headache and stomach ache last night but they are gone away;nurse triage initiated and recommendations made per protocol to include seeing a physician within 24 hours; pt normally sees Dr Quay Burow but she has no availability within the parameters per protocol; pt offered and accepted appointment with Dr Clearance Coots, [facility] today at 1340; she verbalizes understanding; also spoke with Gareth Eagle regarding this appointment; will route to office for notification of this upcoming appointment. Reason for Disposition . [1] MODERATE leg swelling (e.g., swelling extends up to knees) AND [2] new onset or worsening  Answer Assessment - Initial Assessment Questions 1. ONSET: "When did the swelling start?" (e.g., minutes, hours, days)     06/02/17 noticed by Dr Estanislado Pandy 2. LOCATION: "What part of the leg is swollen?"  "Are both legs swollen or just one leg?"     Both legs and feet ankle to knee 3. SEVERITY: "How bad is the swelling?" (e.g., localized; mild, moderate, severe)  - Localized - small area of swelling localized to one leg  - MILD pedal edema - swelling limited to foot and ankle, pitting edema < 1/4 inch (6 mm) deep, rest and elevation eliminate most or all swelling  - MODERATE edema - swelling of lower leg to knee, pitting edema > 1/4 inch (6 mm) deep, rest and elevation only partially reduce swelling  - SEVERE edema - swelling extends above knee, facial or hand swelling present     Per pt mild to moderate 4. REDNESS: "Does the swelling look red or infected?"    no 5. PAIN: "Is the swelling painful to touch?" If so, ask: "How painful is it?"   (Scale 1-10; mild, moderate or severe)     no 6. FEVER: "Do you have a fever?" If so, ask: "What is it, how was it measured, and when did it start?"      no 7. CAUSE: "What do you think is causing the leg  swelling?"     unsure 8. MEDICAL HISTORY: "Do you have a history of heart failure, kidney disease, liver failure, or cancer?"     hypertension 9. RECURRENT SYMPTOM: "Have you had leg swelling before?" If so, ask: "When was the last time?" "What happened that time?"     no 10. OTHER SYMPTOMS: "Do you have any other symptoms?" (e.g., chest pain, difficulty breathing)      no 11. PREGNANCY: "Is there any chance you are pregnant?" "When was your last menstrual period?"       no  Protocols used: LEG SWELLING AND EDEMA-A-AH

## 2017-06-03 NOTE — Progress Notes (Signed)
AIVA MISKELL - 72 y.o. female MRN [id number]  Date of birth: 07-24-1945  SUBJECTIVE:  Including CC & ROS.  Chief Complaint  Patient presents with  . Hypertension  . Leg Swelling    DOLORA RIDGELY is a 72 y.o. female that is presenting with leg swelling and hypertension. Leg swelling has been ongoing for two months. Denies swelling. Denies changes in her medications. She was seen yesterday at her Rheumatologist with an elevated blood pressure 159/89. Takes her blood pressure medication daily. Denies headaches. Denies dizziness.     Review of Systems  Constitutional: Negative for fever.  HENT: Negative for congestion.   Respiratory: Negative for cough.   Cardiovascular: Positive for leg swelling.  Gastrointestinal: Negative for abdominal distention.  Musculoskeletal: Negative for gait problem.  Skin: Negative for color change.  Neurological: Negative for weakness.  Hematological: Negative for adenopathy.  Psychiatric/Behavioral: Negative for agitation.    HISTORY: Past Medical, Surgical, Social, and Family History Reviewed & Updated per EMR.   Pertinent Historical Findings include:  Past Medical History:  Diagnosis Date  . Breast cancer ([place]) 1990s   right breast  . Diabetes mellitus without complication ([place])   . GERD (gastroesophageal reflux disease)   . HTN (hypertension)   . Hypercholesterolemia   . Obesity     Past Surgical History:  Procedure Laterality Date  . ABDOMINAL [place]CT[place]Y  [place]   [place], [place]ids  . COLONOSCOPY  May 2003   Dr. Tamala Julian: normal  . COLONOSCOPY  06/22/2011   Procedure: COLONOSCOPY;  Surgeon: Danie Binder, MD;  [facility];  Service: Endoscopy;  Laterality: N/A;  10:30  . MASTECTOMY  1995   right     Allergies  Allergen Reactions  . Levofloxacin Hives and Itching  . Nitrofurantoin Hives and Itching  . Penicillins Hives and Itching  . Sulfonamide Derivatives Itching    Family History  Problem Relation Age of Onset  .  Diabetes Mother        diabetic coma  . Heart failure Mother   . Diabetes Father   . Emphysema Father   . Diabetes Sister   . Hypercholesterolemia Sister   . Hypercholesterolemia Brother   . Diabetes Brother   . Hypertension Brother   . Hypercholesterolemia Brother   . Diabetes Sister   . Hypertension Sister   . Hypercholesterolemia Sister   . Hypercholesterolemia Sister   . Hypercholesterolemia Sister   . Diabetes Brother   . Hypertension Brother   . Colon cancer Neg Hx      Social History   Socioeconomic History  . Marital status: Married    Spouse name: Not on file  . Number of children: 3  . Years of education: 7  . Highest education level: Not on file  Occupational History  . Not on file  Social Needs  . Financial resource strain: Not on file  . Food insecurity:    Worry: Not on file    Inability: Not on file  . Transportation needs:    Medical: Not on file    Non-medical: Not on file  Tobacco Use  . Smoking status: Never Smoker  . Smokeless tobacco: Never Used  Substance and Sexual Activity  . Alcohol use: No  . Drug use: No  . Sexual activity: Never    Birth control/protection: Rhythm  Lifestyle  . Physical activity:    Days per week: Not on file    Minutes per session: Not on file  . Stress: Not  on file  Relationships  . Social connections:    Talks on phone: Not on file    Gets together: Not on file    Attends religious service: Not on file    Active member of club or organization: Not on file    Attends meetings of clubs or organizations: Not on file    Relationship status: Not on file  . Intimate partner violence:    Fear of current or ex partner: Not on file    Emotionally abused: Not on file    Physically abused: Not on file    Forced sexual activity: Not on file  Other Topics Concern  . Not on file  Social History Narrative   Lives at home with husband   Caffeine, maybe 1 a day     PHYSICAL EXAM:  VS: BP (!) 146/84 (BP Location:  Left Arm, Patient Position: Sitting, Cuff Size: Normal)   Pulse 86   Temp 98 F (36.7 C) (Oral)   Ht 5' 9.5" (1.765 m)   Wt 202 lb (91.6 kg)   SpO2 96%   BMI 29.40 kg/m  Physical Exam Gen: NAD, alert, cooperative with exam, well-appearing ENT: normal lips, normal nasal mucosa,  Eye: normal EOM, normal conjunctiva and lids CV:  Trace to no edema, +2 pedal pulses   Resp: no accessory muscle use, non-labored,  Skin: no rashes, no areas of induration  Neuro: normal tone, normal sensation to touch Psych:  normal insight, alert and oriented MSK: normal gait, normal strength      ASSESSMENT & PLAN:   Essential hypertension, benign Mildly elevated.  - would continue current regimen and follow up if continues to be high.   Bilateral leg edema Has a history of edema. Trace today.  - advised compression  - can consider changing medications if worsens.

## 2017-06-03 NOTE — Telephone Encounter (Signed)
Copied from [place]. Topic: General - Other >> Jun 03, 2017  9:57 AM Yvette Rack wrote: Reason for CRM: Pt requested to speak with nurse. Pt did not want to provide any information. Pt requested that her call be returned at [phone] or at [phone].

## 2017-06-03 NOTE — Assessment & Plan Note (Signed)
Has a history of edema. Trace today.  - advised compression  - can consider changing medications if worsens.

## 2017-06-03 NOTE — Telephone Encounter (Signed)
Spoke with pt, appt was made to see Dr Raeford Razor this afternoon. Nothing needed at this time.

## 2017-06-03 NOTE — Patient Instructions (Signed)
Please follow up with Dr. Quay Burow for you blood pressure. It looks good today  Please try compression socks for your legs.

## 2017-06-03 NOTE — Assessment & Plan Note (Signed)
Mildly elevated.  - would continue current regimen and follow up if continues to be high.

## 2017-06-10 DIAGNOSIS — S92515A Nondisplaced fracture of proximal phalanx of left lesser toe(s), initial encounter for closed fracture: Secondary | ICD-10-CM | POA: Diagnosis not present

## 2017-06-28 DIAGNOSIS — H40013 Open angle with borderline findings, low risk, bilateral: Secondary | ICD-10-CM | POA: Diagnosis not present

## 2017-07-13 DIAGNOSIS — S92515D Nondisplaced fracture of proximal phalanx of left lesser toe(s), subsequent encounter for fracture with routine healing: Secondary | ICD-10-CM | POA: Diagnosis not present

## 2017-07-13 DIAGNOSIS — M79675 Pain in left toe(s): Secondary | ICD-10-CM | POA: Diagnosis not present

## 2017-07-19 NOTE — Progress Notes (Signed)
Office Visit Note  Patient: Angela Simmons             Date of Birth: 07-17-1945           MRN: [id number]             PCP: Binnie Rail, MD Referring: Binnie Rail, MD Visit Date: 07/20/2017 Occupation: @GUAROCC @    Subjective:  Muscle aches in lower extremities   History of Present Illness: Angela Simmons is a 72 y.o. female with history of PMR, autoimmune disease, sicca syndrome, ischial bursitis of left side.  Patient is on Plaquenil 200 mg twice daily.  She is no longer taking Prednisone.  She does not need any refills of Plaquenil.  She denies any autoimmune flares.  She denies any sores in her mouth or nose.  She denies any swollen lymph nodes.  She denies any recent rashes or facial rashes.  She denies any low-grade fever.  She reports some increased fatigue.  She continues to have sicca symptoms.  She reports that she has been having increased muscle tenderness and muscle aches in her lower extremities and upper extremity's.  She denies any muscle weakness.  She states that the pain in her legs has been waking up at night.  She describes it as a sensation as if cold water is running over her legs.  She states she is also been having increased discomfort in bilateral knee joints.  She states that she does not have any swelling at this time.  She has been taking ibuprofen over-the-counter as needed for pain relief.  She states she has some stiffness in her knees especially if she has been sitting for prolonged periods of time.  She reports she has some difficulty getting up from a chair.  She continues to have left ischio bursitis.  She declined physical therapy at her last visit due to her husband being in physical therapy and not being able to make the appointments.  She reports that she has been trying to sit on padded chairs but has been unable to find a cushion.  She denies any symptoms of sciatica. Patient reports that on 06/05/2017 she stubbed her left fourth toe which resulted in  a fracture.  She has been wearing an orthotic shoe for the last several weeks.  She has been following up with Dr. Irving Shows for management.  She continues to have some pain and swelling.  She has to wear her orthotic shoe for the next 3 weeks. She reports that her blood pressure has been elevated lately.  She has been monitoring at home.   Activities of Daily Living:  Patient reports morning stiffness for 15-30  minutes.   Patient Reports nocturnal pain.  Difficulty dressing/grooming: Denies Difficulty climbing stairs: Reports Difficulty getting out of chair: Reports Difficulty using hands for taps, buttons, cutlery, and/or writing: Denies   Review of Systems  Constitutional: Positive for fatigue.  HENT: Positive for mouth dryness. Negative for mouth sores and nose dryness.   Eyes: Positive for dryness. Negative for pain and visual disturbance.  Respiratory: Negative for cough, hemoptysis, shortness of breath and difficulty breathing.   Cardiovascular: Positive for hypertension. Negative for chest pain, palpitations and swelling in legs/feet.  Gastrointestinal: Negative for blood in stool, constipation and diarrhea.  Endocrine: Negative for increased urination.  Genitourinary: Negative for painful urination.  Musculoskeletal: Positive for arthralgias, joint pain, myalgias, morning stiffness, muscle tenderness and myalgias. Negative for joint swelling and muscle weakness.  Skin:  Negative for color change, pallor, rash, hair loss, nodules/bumps, skin tightness, ulcers and sensitivity to sunlight.  Allergic/Immunologic: Negative for susceptible to infections.  Neurological: Negative for dizziness, numbness, headaches and weakness.  Hematological: Negative for swollen glands.  Psychiatric/Behavioral: Negative for depressed mood and sleep disturbance. The patient is not nervous/anxious.     PMFS History:  Patient Active Problem List   Diagnosis Date Noted  . Ischial bursitis of left side  06/02/2017  . Rash and nonspecific skin eruption 04/23/2017  . Sicca syndrome ([place]) 06/23/2016  . Bilateral leg edema 04/07/2016  . Burning sensation of skin 04/07/2016  . PMR (polymyalgia rheumatica) (HCC) 03/27/2016  . Osteopenia 03/04/2016  . High risk medication use 11/21/2015  . Autoimmune disease ([place]) 03/18/2015  . Inflammatory arthritis 03/18/2015  . ANA positive 12/02/2014  . Joint pain 11/22/2014  . Overweight (BMI 25.0-29.9) 08/18/2014  . Low back pain with radiation 08/18/2014  . Vulvar lesion 06/28/2014  . Hyperlipidemia 06/28/2014  . Chest pain, atypical 04/11/2014  . Tendonitis of shoulder 08/29/2013  . S/P right mastectomy 11/14/2012  . Type 2 diabetes, controlled, with neuropathy ([place]) 10/05/2012  . Allergic rhinitis 01/26/2011  . GERD 11/10/2009  . INTERSTITIAL CYSTITIS 10/31/2008  . BENIGN NEOPLASM OF VULVA 02/01/2008  . Essential hypertension, benign 02/08/2007  . History of cancer of right breast 02/08/2007    Past Medical History:  Diagnosis Date  . Breast cancer ([place]) 1990s   right breast  . Diabetes mellitus without complication ([place])   . GERD (gastroesophageal reflux disease)   . HTN (hypertension)   . Hypercholesterolemia   . Obesity     Family History  Problem Relation Age of Onset  . Diabetes Mother        diabetic coma  . Heart failure Mother   . Diabetes Father   . Emphysema Father   . Diabetes Sister   . Hypercholesterolemia Sister   . Hypercholesterolemia Brother   . Diabetes Brother   . Hypertension Brother   . Hypercholesterolemia Brother   . Diabetes Sister   . Hypertension Sister   . Hypercholesterolemia Sister   . Hypercholesterolemia Sister   . Hypercholesterolemia Sister   . Diabetes Brother   . Hypertension Brother   . Colon cancer Neg Hx    Past Surgical History:  Procedure Laterality Date  . ABDOMINAL [place]CTOMY  [place]  . COLONOSCOPY  May 2003   Dr. Tamala Julian: normal  . COLONOSCOPY  06/22/2011    Procedure: COLONOSCOPY;  Surgeon: Danie Binder, MD;  Location: [facility];  Service: Endoscopy;  Laterality: N/A;  10:30  . MASTECTOMY  1995   right    Social History   Social History Narrative   Lives at home with husband   Caffeine, maybe 1 a day     Objective: Vital Signs: BP (!) 162/93 (BP Location: Left Arm, Patient Position: Sitting, Cuff Size: Normal)   Pulse 79   Resp 15   Ht 5\' 9"  (1.753 m)   Wt 204 lb (92.5 kg)   BMI 30.13 kg/m    Physical Exam  Constitutional: She is oriented to person, place, and time. She appears well-developed and well-nourished.  HENT:  Head: Normocephalic and atraumatic.  Eyes: Conjunctivae and EOM are normal.  Neck: Normal range of motion.  Cardiovascular: Normal rate, regular rhythm, normal heart sounds and intact distal pulses.  Pulmonary/Chest: Effort normal and breath sounds normal.  Abdominal: Soft. Bowel sounds are normal.  Lymphadenopathy:  She has no cervical adenopathy.  Neurological: She is alert and oriented to person, place, and time.  Skin: Skin is warm and dry. Capillary refill takes less than 2 seconds.  Psychiatric: She has a normal mood and affect. Her behavior is normal.  Nursing note and vitals reviewed.    Musculoskeletal Exam: C-spine, thoracic spine, lumbar spine good range of motion.  Shoulder joints, elbow joints, wrist joints, MCPs, PIPs, DIPs good range of motion with no synovitis.  Hip joints, knee joints, ankle joints, MTPs, PIPs, DIPs good range of motion no synovitis.  She has tenderness in the left initial bursa consistent with initial bursitis.  She has muscle tenderness in her lower extremities.  No muscle weakness she has full strength of upper and lower extremities.  No tenderness of SI joints.  No midline spinal tenderness.  No warmth or effusion of bilateral knee joints.  She has bilateral knee crepitus.  CDAI Exam: No CDAI exam completed.    Investigation: No additional findings. CBC Latest  Ref Rng & Units 06/02/2017 12/31/2016 06/30/2016  WBC 3.8 - 10.8 Thousand/uL 3.8 4.2 4.2  Hemoglobin 11.7 - 15.5 g/dL 12.0 12.5 12.3  Hematocrit 35.0 - 45.0 % 36.3 38.1 37.4  Platelets 140 - 400 Thousand/uL 242 268 269   CMP Latest Ref Rng & Units 06/02/2017 02/25/2017 12/31/2016  Glucose 65 - 99 mg/dL 81 76 98  BUN 7 - 25 mg/dL 11 16 15   Creatinine 0.60 - 0.93 mg/dL 0.94(H) 1.07 0.99(H)  Sodium 135 - 146 mmol/L 140 140 141  Potassium 3.5 - 5.3 mmol/L 4.1 4.0 4.1  Chloride 98 - 110 mmol/L 106 105 104  CO2 20 - 32 mmol/L 29 30 29   Calcium 8.6 - 10.4 mg/dL 9.4 9.0 9.4  Total Protein 6.1 - 8.1 g/dL 7.4 7.2 7.1  Total Bilirubin 0.2 - 1.2 mg/dL 0.4 0.4 0.3  Alkaline Phos 39 - 117 U/L - 53 -  AST 10 - 35 U/L 24 12 11   ALT 6 - 29 U/L 21 11 9      Imaging: No results found.  Speciality Comments: PLQ EYe Exam: 12/25/16 WNL @ [facility] follow up in 6 months.     Procedures:  No procedures performed Allergies: Levofloxacin; Nitrofurantoin; Penicillins; and Sulfonamide derivatives   Assessment / Plan:     Visit Diagnoses: PMR (polymyalgia rheumatica) (HCC) -She has been having increased muscle aches and muscle tenderness especially in the lower extremities.  She has no muscle weakness and has full strength on exam today.  Due to her increase in symptoms we will check sedimentation rate and CK level today.  She was tapered off of prednisone several months ago.  She has no temporal artery tenderness on exam today.  She has been having some mild tension headaches.  She has not had any blurry vision or vision changes.  She was advised to notify us if she develops increased headaches or temporal tenderness.  We will call her with the results of Sed rate and CK.  We will not make any changes to medications until that time. She was advised to call back if her symptoms acutely worsen before her lab results return.- Plan: Sedimentation rate  Myalgia -She has been having increased muscle aches and  muscle tenderness of her lower extremities.  A CK and sed rate will be checked today.  Plan: CK   Autoimmune disease ([place]) - ANA +, sicca symptoms: She has not had any recent flares of autoimmune disease.  She  continues to have sicca symptoms.  No parotid swelling was noted.  No oral or nasal ulcers were noted on exam.  No cervical lymphadenopathy.  No malar rash or skin lesions were noted.  No recent low-grade fevers.  No shortness of breath or palpitations lately.  She will continue taking Plaquenil 200 mg twice daily.  She does not need refills of Plaquenil at this time.  Sicca syndrome [facility]): She continues to have sicca symptoms.  No parotid swelling was noted.  Over-the-counter products were discussed.  She takes Plaquenil 200 mg twice daily.  She does not need any refills of Plaquenil at this time.  High risk medication use - PLQ 200 mg BID Eye exam 12/25/16.  CBC and CMP are drawn on 06/02/2017 to monitor for drug toxicity.  She reports that the price of her Plaquenil has increased.  She was given a good Rx coupon for her next refill.  Ischial bursitis of left side: She continues to have tenderness in the left ischial tuberosity.  We discussed the importance of sitting on padded seats.  We called the medical supplies to see if they have an initial tuberosity cushion and they stated they would special order one for her.  She was given a prescription for an ischial tuberosity question.  Due to her fracturing her left fourth toe in her currently wearing an orthotic shoe we will not make a referral to physical therapy at this time.  She is advised to notify us in 3 weeks when she no longer has to wear the orthotic shoe.  I will place the referral at that time.  She was advised to continue to perform the stretching exercises that were demonstrated and discussed at the last visit.  Chronic pain in both knees: She has been having increased discomfort in bilateral knee joints.  She has mild knee joint  crepitus.  No warmth or effusion on exam.  She declined X-rays at this time.  We discussed trying Aspercreme or Mobisyl over-the-counter but she would like a prescription strength topical cream.  As a prescription for Voltaren gel was sent to the pharmacy today.  She was given a good Rx coupon.  Osteopenia of multiple sites: She takes a calcium and vitamin D supplement daily.  History of peripheral neuropathy: She continues to have symptoms of peripheral neuropathy.  Other fatigue: She has been having some worsening fatigue recently.  She was advised to try to stay active and to perform light exercise on a regular basis.  Tendinitis of left shoulder: She has good range of motion with no discomfort at this time.  History of hypertension: Her blood pressure is very elevated today in the office.  We discussed the risks of elevated blood pressure.  She is not having any blurry vision or dizziness at this time.  She has been having mild headaches recently.  She was advised to follow-up with PCP to discuss potential medication changes.  She has been taking her antihypertensive medications as prescribed.  She was advised to monitor her blood pressure closely at home and keep a diary of the readings.  Other medical conditions are listed as follows:  History of cystitis  History of gastroesophageal reflux (GERD)  History of hyperlipidemia  History of cancer  History of obesity     Orders: Orders Placed This Encounter  Procedures  . Sedimentation rate  . CK   Meds ordered this encounter  Medications  . diclofenac sodium (VOLTAREN) 1 % GEL    Sig:  Apply 3 g to 3 large joints up to 3 times daily.    Dispense:  3 Tube    Refill:  3    Face-to-face time spent with patient was 30 minutes. Greater than 50% of time was spent in counseling and coordination of care.  Follow-Up Instructions: Return for PMR, Autoimmune disease, sicca syndrome.   Ofilia Neas, PA-C  Note - This record has  been created using Dragon software.  Chart creation errors have been sought, but may not always  have been located. Such creation errors do not reflect on  the standard of medical care.

## 2017-07-20 ENCOUNTER — Encounter: Payer: Self-pay | Admitting: Physician Assistant

## 2017-07-20 ENCOUNTER — Ambulatory Visit: Payer: PPO | Admitting: Physician Assistant

## 2017-07-20 VITALS — BP 162/93 | HR 79 | Resp 15 | Ht 69.0 in | Wt 204.0 lb

## 2017-07-20 DIAGNOSIS — M353 Polymyalgia rheumatica: Secondary | ICD-10-CM

## 2017-07-20 DIAGNOSIS — M7072 Other bursitis of hip, left hip: Secondary | ICD-10-CM

## 2017-07-20 DIAGNOSIS — M791 Myalgia, unspecified site: Secondary | ICD-10-CM

## 2017-07-20 DIAGNOSIS — Z79899 Other long term (current) drug therapy: Secondary | ICD-10-CM

## 2017-07-20 DIAGNOSIS — M3500 Sicca syndrome, unspecified: Secondary | ICD-10-CM | POA: Diagnosis not present

## 2017-07-20 DIAGNOSIS — Z87440 Personal history of urinary (tract) infections: Secondary | ICD-10-CM

## 2017-07-20 DIAGNOSIS — D8989 Other specified disorders involving the immune mechanism, not elsewhere classified: Secondary | ICD-10-CM

## 2017-07-20 DIAGNOSIS — R5383 Other fatigue: Secondary | ICD-10-CM

## 2017-07-20 DIAGNOSIS — M25562 Pain in left knee: Secondary | ICD-10-CM

## 2017-07-20 DIAGNOSIS — M8589 Other specified disorders of bone density and structure, multiple sites: Secondary | ICD-10-CM | POA: Diagnosis not present

## 2017-07-20 DIAGNOSIS — Z8719 Personal history of other diseases of the digestive system: Secondary | ICD-10-CM

## 2017-07-20 DIAGNOSIS — Z8669 Personal history of other diseases of the nervous system and sense organs: Secondary | ICD-10-CM

## 2017-07-20 DIAGNOSIS — M778 Other enthesopathies, not elsewhere classified: Secondary | ICD-10-CM

## 2017-07-20 DIAGNOSIS — Z8639 Personal history of other endocrine, nutritional and metabolic disease: Secondary | ICD-10-CM

## 2017-07-20 DIAGNOSIS — G8929 Other chronic pain: Secondary | ICD-10-CM

## 2017-07-20 DIAGNOSIS — M25561 Pain in right knee: Secondary | ICD-10-CM

## 2017-07-20 DIAGNOSIS — Z8679 Personal history of other diseases of the circulatory system: Secondary | ICD-10-CM

## 2017-07-20 DIAGNOSIS — M359 Systemic involvement of connective tissue, unspecified: Secondary | ICD-10-CM

## 2017-07-20 DIAGNOSIS — M7582 Other shoulder lesions, left shoulder: Secondary | ICD-10-CM

## 2017-07-20 DIAGNOSIS — Z859 Personal history of malignant neoplasm, unspecified: Secondary | ICD-10-CM

## 2017-07-20 MED ORDER — DICLOFENAC SODIUM 1 % TD GEL: TRANSDERMAL | 3 refills | Status: AC

## 2017-07-21 ENCOUNTER — Telehealth: Payer: Self-pay | Admitting: Physician Assistant

## 2017-07-21 DIAGNOSIS — M353 Polymyalgia rheumatica: Secondary | ICD-10-CM

## 2017-07-21 LAB — SEDIMENTATION RATE: Sed Rate: 43 mm/h — ABNORMAL HIGH (ref 0–30)

## 2017-07-21 LAB — CK: Total CK: 141 U/L (ref 29–143)

## 2017-07-21 NOTE — Progress Notes (Signed)
I called the patient to discuss her lab work.  She will return in 1 month for a repeat sed rate.

## 2017-07-21 NOTE — Telephone Encounter (Signed)
I called the patient to discuss her recent lab work that was drawn yesterday.  Her sed rate was 43.  1 month ago sed rate was 45.  CK was 141.  She is not on prednisone at this time.  I called her to discuss her symptoms further.  She continues to have muscle aches in her lower extremities but denies any muscle weakness.  She denies any difficulty getting up from a chair due to weakness.  She states she has some stiffness when first arising from a chair but no difficulty getting up from a chair.  She denies any difficulty raising arms above her head.  She was advised to follow-up with her PCP for management of her blood pressure.  We will recheck her sed rate in 1 month.  If her sed rate continues to be elevated or further rises we will add prednisone 10 mg by mouth daily.  We want to ensure her blood pressure is not under control in case we do add prednisone.

## 2017-07-27 ENCOUNTER — Telehealth: Payer: Self-pay | Admitting: *Deleted

## 2017-07-27 DIAGNOSIS — S92515D Nondisplaced fracture of proximal phalanx of left lesser toe(s), subsequent encounter for fracture with routine healing: Secondary | ICD-10-CM | POA: Diagnosis not present

## 2017-07-27 DIAGNOSIS — M79675 Pain in left toe(s): Secondary | ICD-10-CM | POA: Diagnosis not present

## 2017-07-27 NOTE — Telephone Encounter (Signed)
Called patient in response to CRM message that she would like for nurse to contact her. Patient did not answer,  a VM was left with nurse's contact number requesting that the patient call her back.

## 2017-08-26 ENCOUNTER — Encounter: Payer: Self-pay | Admitting: Internal Medicine

## 2017-08-26 ENCOUNTER — Other Ambulatory Visit (INDEPENDENT_AMBULATORY_CARE_PROVIDER_SITE_OTHER): Payer: PPO

## 2017-08-26 ENCOUNTER — Ambulatory Visit (INDEPENDENT_AMBULATORY_CARE_PROVIDER_SITE_OTHER): Payer: PPO | Admitting: Internal Medicine

## 2017-08-26 VITALS — BP 132/72 | HR 77 | Temp 97.7°F | Resp 16 | Wt 201.0 lb

## 2017-08-26 DIAGNOSIS — E1140 Type 2 diabetes mellitus with diabetic neuropathy, unspecified: Secondary | ICD-10-CM

## 2017-08-26 DIAGNOSIS — E7849 Other hyperlipidemia: Secondary | ICD-10-CM

## 2017-08-26 DIAGNOSIS — R0789 Other chest pain: Secondary | ICD-10-CM

## 2017-08-26 DIAGNOSIS — K219 Gastro-esophageal reflux disease without esophagitis: Secondary | ICD-10-CM

## 2017-08-26 DIAGNOSIS — I10 Essential (primary) hypertension: Secondary | ICD-10-CM

## 2017-08-26 LAB — COMPREHENSIVE METABOLIC PANEL
ALT: 18 U/L (ref 0–35)
AST: 20 U/L (ref 0–37)
Albumin: 3.9 g/dL (ref 3.5–5.2)
Alkaline Phosphatase: 62 U/L (ref 39–117)
BILIRUBIN TOTAL: 0.5 mg/dL (ref 0.2–1.2)
BUN: 12 mg/dL (ref 6–23)
CHLORIDE: 104 meq/L (ref 96–112)
CO2: 29 meq/L (ref 19–32)
CREATININE: 1.02 mg/dL (ref 0.40–1.20)
Calcium: 9.6 mg/dL (ref 8.4–10.5)
GFR: 68.65 mL/min (ref >60.00)
Glucose, Bld: 83 mg/dL (ref 70–99)
Potassium: 3.9 mEq/L (ref 3.5–5.1)
Sodium: 140 mEq/L (ref 135–145)
Total Protein: 7.7 g/dL (ref 6.0–8.3)

## 2017-08-26 LAB — LIPID PANEL
CHOL/HDL RATIO: 3
CHOLESTEROL: 128 mg/dL (ref 0–200)
HDL: 50 mg/dL (ref >39.00)
LDL CALC: 68 mg/dL (ref 0–99)
NonHDL: 78.04
Triglycerides: 49 mg/dL (ref 0.0–149.0)
VLDL: 9.8 mg/dL (ref 0.0–40.0)

## 2017-08-26 LAB — HEMOGLOBIN A1C: HEMOGLOBIN A1C: 6.5 % (ref 4.6–6.5)

## 2017-08-26 NOTE — Patient Instructions (Addendum)
  Test(s) ordered today. Your results will be released to [facility] (or called to you) after review, usually within 72hours after test completion. If any changes need to be made, you will be notified at that same time.  Medications reviewed and updated.  No changes recommended at this time.  Your prescription(s) have been submitted to your pharmacy. Please take as directed and contact our office if you believe you are having problem(s) with the medication(s).  A referral was ordered for   Please followup in 3-4 months

## 2017-08-26 NOTE — Assessment & Plan Note (Signed)
She had an episode of atypical chest pain not long ago She did have a negative stress test less than 2 years ago Her chest pain that she experienced recently was relieved with removing her bra and she has not had any episodes since then even with activity Very atypical She will monitor for now and let me know if this recurs, but no further evaluation is needed at this time

## 2017-08-26 NOTE — Assessment & Plan Note (Signed)
Check A1c Diet controlled She is compliant with a diabetic diet Encouraged regular exercise

## 2017-08-26 NOTE — Assessment & Plan Note (Signed)
Blood pressure looks good here today, but still unable to know for sure how well it is controlled She will continue to monitor She is currently not exercising and I stressed regular exercise Continue current medications CMP

## 2017-08-26 NOTE — Progress Notes (Signed)
Subjective:    Patient ID: Angela Simmons, female    DOB: 1945-05-02, 72 y.o.   MRN: [id number]  HPI The patient is here for follow up.  Hypertension: She is taking her medication daily. She is compliant with a low sodium diet.  She had one episode of chest pain that occurred after activity.  she felt tightness around her chest.  Removing her bra helped.  She denies chest pain with activity and has not had an episode since.  She denies palpitations and regular headaches. She is active, but not exercising regularly.  She does monitor her blood pressure at home - 130-140's/?.    Hyperlipidemia: She is taking her medication daily. She is compliant with a low fat/cholesterol diet. She is exercising a little. She denies myalgias.   Diabetes: She is controlling her sugars with diet. She is compliant with a diabetic diet. She is exercising minimally. She monitors her sugars and they have been running 85-120, 141 is the highest it has been. She checks her feet daily and denies foot lesions. She is up-to-date with an ophthalmology examination.   GERD:  She is taking her medication daily as prescribed.  She denies any GERD symptoms and feels her GERD is well controlled.    Medications and allergies reviewed with patient and updated if appropriate.  Patient Active Problem List   Diagnosis Date Noted  . Ischial bursitis of left side 06/02/2017  . Rash and nonspecific skin eruption 04/23/2017  . Sicca syndrome ([place]) 06/23/2016  . Bilateral leg edema 04/07/2016  . Burning sensation of skin 04/07/2016  . PMR (polymyalgia rheumatica) ([facility]) 03/27/2016  . Osteopenia 03/04/2016  . High risk medication use 11/21/2015  . Autoimmune disease ([place]) 03/18/2015  . Inflammatory arthritis 03/18/2015  . ANA positive 12/02/2014  . Joint pain 11/22/2014  . Overweight (BMI 25.0-29.9) 08/18/2014  . Low back pain with radiation 08/18/2014  . Vulvar lesion 06/28/2014  . Hyperlipidemia 06/28/2014  . Chest pain,  atypical 04/11/2014  . Tendonitis of shoulder 08/29/2013  . S/P right mastectomy 11/14/2012  . Type 2 diabetes, controlled, with neuropathy ([place]) 10/05/2012  . Allergic rhinitis 01/26/2011  . GERD 11/10/2009  . INTERSTITIAL CYSTITIS 10/31/2008  . BENIGN NEOPLASM OF VULVA 02/01/2008  . Essential hypertension, benign 02/08/2007  . History of cancer of right breast 02/08/2007    Current Outpatient Medications on File Prior to Visit  Medication Sig Dispense Refill  . amLODipine (NORVASC) 5 MG tablet TAKE 1 TABLET BY MOUTH EVERY DAY 90 tablet 3  . aspirin (ADULT ASPIRIN EC LOW STRENGTH) 81 MG EC tablet Take 81 mg by mouth daily.     . benazepril (LOTENSIN) 40 MG tablet TAKE ONE TABLET BY MOUTH EVERY DAY 90 tablet 1  . Blood Glucose Monitoring Suppl (ONETOUCH VERIO) w/Device KIT USE TO TEST BLOOD SUGAR ONCE DAILY AS DIRECTED 1 kit 0  . calcium-vitamin D (OSCAL WITH D) 500-200 MG-UNIT tablet Take 1 tablet by mouth daily with breakfast.    . cloNIDine (CATAPRES) 0.1 MG tablet TAKE ONE TABLET BY MOUTH TWICE DAILY 180 tablet 1  . diclofenac sodium (VOLTAREN) 1 % GEL Apply 3 g to 3 large joints up to 3 times daily. 3 Tube 3  . fluticasone (FLONASE) 50 MCG/ACT nasal spray USE 2 SPRAYS IN EACH NOSTRIL EVERY DAY 48 g 0  . glucose blood (FORA V30A BLOOD GLUCOSE TEST) test strip Once daily testing 100 each 0  . hydroxychloroquine (PLAQUENIL) 200 MG tablet Take 1 tablet (  200 mg total) by mouth 2 (two) times daily. 180 tablet 1  . Lancets (UNILET EXCELITE II) MISC TEST ONCE DAILY 100 each 0  . loratadine (CLARITIN) 10 MG tablet TAKE 1 TABLET BY MOUTH EVERY DAY (Patient taking differently: TAKE 1 TABLET BY MOUTH EVERY DAY PRN) 30 tablet 2  . lovastatin (MEVACOR) 40 MG tablet TAKE ONE TABLET BY MOUTH EVERY DAY 90 tablet 1  . Multiple Vitamin (MULTIVITAMIN) tablet Take 1 tablet by mouth daily.    . Omega-3 Fatty Acids (FISH OIL) 1000 MG CAPS Take 1 capsule by mouth daily.     . pantoprazole (PROTONIX) 40 MG  tablet TAKE ONE TABLET BY MOUTH EVERY DAY 90 tablet 1  . ranitidine (ZANTAC) 150 MG tablet Take 150 mg by mouth at bedtime.    . triamcinolone ointment (KENALOG) 0.5 % Apply 1 application topically 2 (two) times daily. Do not use for more than 14 days in a row 15 g 0   No current facility-administered medications on file prior to visit.     Past Medical History:  Diagnosis Date  . Breast cancer ([place]) 1990s   right breast  . Diabetes mellitus without complication ([place])   . GERD (gastroesophageal reflux disease)   . HTN (hypertension)   . Hypercholesterolemia   . Obesity     Past Surgical History:  Procedure Laterality Date  . ABDOM[place]CT[place]Y  [place]  . COLONOSCOPY  May 2003   Dr. Tamala Julian: normal  . COLONOSCOPY  06/22/2011   Procedure: COLONOSCOPY;  Surgeon: Danie Binder, MD;  Location: [facility];  Service: Endoscopy;  Laterality: N/A;  10:30  . MASTECTOMY  1995   right     Social History   Socioeconomic History  . Marital status: Married    Spouse name: Not on file  . Number of children: 3  . Years of education: 7  . Highest education level: Not on file  Occupational History  . Not on file  Social Needs  . Financial resource strain: Not on file  . Food insecurity:    Worry: Not on file    Inability: Not on file  . Transportation needs:    Medical: Not on file    Non-medical: Not on file  Tobacco Use  . Smoking status: Never Smoker  . Smokeless tobacco: Never Used  Substance and Sexual Activity  . Alcohol use: No  . Drug use: Never  . Sexual activity: Never    Birth control/protection: Rhythm  Lifestyle  . Physical activity:    Days per week: Not on file    Minutes per session: Not on file  . Stress: Not on file  Relationships  . Social connections:    Talks on phone: Not on file    Gets together: Not on file    Attends religious service: Not on file    Active member of club or organization: Not on file    Attends meetings of  clubs or organizations: Not on file    Relationship status: Not on file  Other Topics Concern  . Not on file  Social History Narrative   Lives at home with husband   Caffeine, maybe 1 a day    Family History  Problem Relation Age of Onset  . Diabetes Mother        diabetic coma  . Heart failure Mother   . Diabetes Father   . Emphysema Father   . Diabetes Sister   .  Hypercholesterolemia Sister   . Hypercholesterolemia Brother   . Diabetes Brother   . Hypertension Brother   . Hypercholesterolemia Brother   . Diabetes Sister   . Hypertension Sister   . Hypercholesterolemia Sister   . Hypercholesterolemia Sister   . Hypercholesterolemia Sister   . Diabetes Brother   . Hypertension Brother   . Colon cancer Neg Hx     Review of Systems  Constitutional: Negative for chills and fever.  Respiratory: Positive for cough ( a little at night ) and shortness of breath (a little  - chronic, no changes). Negative for wheezing.   Cardiovascular: Positive for chest pain and leg swelling (ankles). Negative for palpitations.  Gastrointestinal:       GERD controlled  Neurological: Positive for light-headedness (alittle) and headaches (back of head, occ).       Objective:   Vitals:   08/26/17 1115  BP: 132/72  Pulse: 77  Resp: 16  Temp: 97.7 F (36.5 C)  SpO2: 98%   BP Readings from Last 3 Encounters:  08/26/17 132/72  07/20/17 (!) 162/93  06/03/17 (!) 146/84   Wt Readings from Last 3 Encounters:  08/26/17 201 lb (91.2 kg)  07/20/17 204 lb (92.5 kg)  06/03/17 202 lb (91.6 kg)   Body mass index is 29.68 kg/m.   Physical Exam    Constitutional: Appears well-developed and well-nourished. No distress.  HENT:  Head: Normocephalic and atraumatic.  Neck: Neck supple. No tracheal deviation present. No thyromegaly present.  No cervical lymphadenopathy Cardiovascular: Normal rate, regular rhythm and normal heart sounds.   No murmur heard. No carotid bruit .  No  edema Pulmonary/Chest: Effort normal and breath sounds normal. No respiratory distress. No has no wheezes. No rales.  Skin: Skin is warm and dry. Not diaphoretic.  Psychiatric: Normal mood and affect. Behavior is normal.      Assessment & Plan:    See Problem List for Assessment and Plan of chronic medical problems.

## 2017-08-26 NOTE — Assessment & Plan Note (Signed)
GERD controlled Continue daily medication  

## 2017-08-26 NOTE — Assessment & Plan Note (Signed)
Check lipid panel  Continue daily statin Regular exercise and healthy diet encouraged  

## 2017-08-31 ENCOUNTER — Telehealth: Payer: Self-pay | Admitting: Rheumatology

## 2017-08-31 NOTE — Telephone Encounter (Signed)
Patient called stating that her left hip is hurting especially at night and runs down her leg.  Patient states her leg goes numb and gets cold.  Patient requested a return call to discuss if she needs to come back in for an appointment or if there is something else she should do.

## 2017-08-31 NOTE — Telephone Encounter (Signed)
Please schedule an appointment.

## 2017-09-01 ENCOUNTER — Ambulatory Visit: Payer: PPO | Admitting: Rheumatology

## 2017-09-01 ENCOUNTER — Encounter: Payer: Self-pay | Admitting: Physician Assistant

## 2017-09-01 VITALS — BP 140/82 | HR 83 | Resp 16 | Ht 69.0 in | Wt 202.4 lb

## 2017-09-01 DIAGNOSIS — M7061 Trochanteric bursitis, right hip: Secondary | ICD-10-CM

## 2017-09-01 DIAGNOSIS — Z8669 Personal history of other diseases of the nervous system and sense organs: Secondary | ICD-10-CM | POA: Diagnosis not present

## 2017-09-01 DIAGNOSIS — M359 Systemic involvement of connective tissue, unspecified: Secondary | ICD-10-CM

## 2017-09-01 DIAGNOSIS — Z87440 Personal history of urinary (tract) infections: Secondary | ICD-10-CM

## 2017-09-01 DIAGNOSIS — M353 Polymyalgia rheumatica: Secondary | ICD-10-CM

## 2017-09-01 DIAGNOSIS — M7072 Other bursitis of hip, left hip: Secondary | ICD-10-CM

## 2017-09-01 DIAGNOSIS — Z8679 Personal history of other diseases of the circulatory system: Secondary | ICD-10-CM

## 2017-09-01 DIAGNOSIS — Z8639 Personal history of other endocrine, nutritional and metabolic disease: Secondary | ICD-10-CM | POA: Diagnosis not present

## 2017-09-01 DIAGNOSIS — Z79899 Other long term (current) drug therapy: Secondary | ICD-10-CM | POA: Diagnosis not present

## 2017-09-01 DIAGNOSIS — M3500 Sicca syndrome, unspecified: Secondary | ICD-10-CM | POA: Diagnosis not present

## 2017-09-01 DIAGNOSIS — M7062 Trochanteric bursitis, left hip: Secondary | ICD-10-CM

## 2017-09-01 DIAGNOSIS — M8589 Other specified disorders of bone density and structure, multiple sites: Secondary | ICD-10-CM | POA: Diagnosis not present

## 2017-09-01 DIAGNOSIS — D8989 Other specified disorders involving the immune mechanism, not elsewhere classified: Secondary | ICD-10-CM

## 2017-09-01 DIAGNOSIS — Z8719 Personal history of other diseases of the digestive system: Secondary | ICD-10-CM

## 2017-09-01 DIAGNOSIS — R5383 Other fatigue: Secondary | ICD-10-CM

## 2017-09-01 DIAGNOSIS — Z859 Personal history of malignant neoplasm, unspecified: Secondary | ICD-10-CM

## 2017-09-01 MED ORDER — LIDOCAINE HCL 1 % IJ SOLN
1.5000 mL | INTRAMUSCULAR | Status: AC | PRN
  Administered 2017-09-01: 1.5 mL

## 2017-09-01 MED ORDER — TRIAMCINOLONE ACETONIDE 40 MG/ML IJ SUSP
40.0000 mg | INTRAMUSCULAR | Status: AC | PRN
  Administered 2017-09-01: 40 mg via INTRA_ARTICULAR

## 2017-09-01 NOTE — Patient Instructions (Addendum)
Magnesium malate 250 mg po 1 tablet at bedtime   Trochanteric Bursitis Trochanteric bursitis is a condition that causes hip pain. Trochanteric bursitis happens when fluid-filled sacs (bursae) in the hip get irritated. Normally these sacs absorb shock and help strong bands of tissue (tendons) in your hip glide smoothly over each other and over your hip bones. What are the causes? This condition results from increased friction between the hip bones and the tendons that go over them. This condition can happen if you:  Have weak hips.  Use your hip muscles too much (overuse).  Get hit in the hip.  What increases the risk? This condition is more likely to develop in:  Women.  Adults who are middle-aged or older.  People with arthritis or a spinal condition.  People with weak buttocks muscles (gluteal muscles).  People who have one leg that is shorter than the other.  People who participate in certain kinds of athletic activities, such as: ? Running sports, especially long-distance running. ? Contact sports, like football or martial arts. ? Sports in which falls may occur, like skiing.  What are the signs or symptoms? The main symptom of this condition is pain and tenderness over the point of your hip. The pain may be:  Sharp and intense.  Dull and achy.  Felt on the outside of your thigh.  It may increase when you:  Lie on your side.  Walk or run.  Go up on stairs.  Sit.  Stand up after sitting.  Stand for long periods of time.  How is this diagnosed? This condition may be diagnosed based on:  Your symptoms.  Your medical history.  A physical exam.  Imaging tests, such as: ? X-rays to check your bones. ? An MRI or ultrasound to check your tendons and muscles.  During your physical exam, your health care provider will check the movement and strength of your hip. He or she may press on the point of your hip to check for pain. How is this treated? This  condition may be treated by:  Resting.  Reducing your activity.  Avoiding activities that cause pain.  Using crutches, a cane, or a walker to decrease the strain on your hip.  Taking medicine to help with swelling.  Having medicine injected into the bursae to help with swelling.  Using ice, heat, and massage therapy for pain relief.  Physical therapy exercises for strength and flexibility.  Surgery (rare).  Follow these instructions at home: Activity  Rest.  Avoid activities that cause pain.  Return to your normal activities as told by your health care provider. Ask your health care provider what activities are safe for you. Managing pain, stiffness, and swelling  Take over-the-counter and prescription medicines only as told by your health care provider.  If directed, apply heat to the injured area as told by your health care provider. ? Place a towel between your skin and the heat source. ? Leave the heat on for 20-30 minutes. ? Remove the heat if your skin turns bright red. This is especially important if you are unable to feel pain, heat, or cold. You may have a greater risk of getting burned.  If directed, apply ice to the injured area: ? Put ice in a plastic bag. ? Place a towel between your skin and the bag. ? Leave the ice on for 20 minutes, 2-3 times a day. General instructions  If the affected leg is one that you use for driving, ask  your health care provider when it is safe to drive.  Use crutches, a cane, or a walker as told by your health care provider.  If one of your legs is shorter than the other, get fitted for a shoe insert.  Lose weight if you are overweight. How is this prevented?  Wear supportive footwear that is appropriate for your sport.  If you have hip pain, start any new exercise or sport slowly.  Maintain physical fitness, including: ? Strength. ? Flexibility. Contact a health care provider if:  Your pain does not improve with  2-4 weeks. Get help right away if:  You develop severe pain.  You have a fever.  You develop increased redness over your hip.  You have a change in your bowel function or bladder function.  You cannot control the muscles in your feet. This information is not intended to replace advice given to you by your health care provider. Make sure you discuss any questions you have with your health care provider. Document Released: 02/13/2004 Document Revised: 09/11/2015 Document Reviewed: 12/21/2014 Elsevier Interactive Patient Education  2018 [employer]. Trochanteric Bursitis Rehab Ask your health care provider which exercises are safe for you. Do exercises exactly as told by your health care provider and adjust them as directed. It is normal to feel mild stretching, pulling, tightness, or discomfort as you do these exercises, but you should stop right away if you feel sudden pain or your pain gets worse.Do not begin these exercises until told by your health care provider. Stretching exercises These exercises warm up your muscles and joints and improve the movement and flexibility of your hip. These exercises also help to relieve pain and stiffness. Exercise A: Iliotibial band stretch  1. Lie on your side with your left / right leg in the top position. 2. Bend your left / right knee and grab your ankle. 3. Slowly bring your knee back so your thigh is behind your body. 4. Slowly lower your knee toward the floor until you feel a gentle stretch on the outside of your left / right thigh. If you do not feel a stretch and your knee will not fall farther, place the heel of your other foot on top of your outer knee and pull your thigh down farther. 5. Hold this position for __________ seconds. 6. Slowly return to the starting position. Repeat __________ times. Complete this exercise __________ times a day. Strengthening exercises These exercises build strength and endurance in your hip and pelvis.  Endurance is the ability to use your muscles for a long time, even after they get tired. Exercise B: Bridge ( hip extensors) 1. Lie on your back on a firm surface with your knees bent and your feet flat on the floor. 2. Tighten your buttocks muscles and lift your buttocks off the floor until your trunk is level with your thighs. You should feel the muscles working in your buttocks and the back of your thighs. If this exercise is too easy, try doing it with your arms crossed over your chest. 3. Hold this position for __________ seconds. 4. Slowly return to the starting position. 5. Let your muscles relax completely between repetitions. Repeat __________ times. Complete this exercise __________ times a day. Exercise C: Squats ( knee extensors and  quadriceps) 1. Stand in front of a table, with your feet and knees pointing straight ahead. You may rest your hands on the table for balance but not for support. 2. Slowly bend your knees and  lower your hips like you are going to sit in a chair. ? Keep your weight over your heels, not over your toes. ? Keep your lower legs upright so they are parallel with the table legs. ? Do not let your hips go lower than your knees. ? Do not bend lower than told by your health care provider. ? If your hip pain increases, do not bend as low. 3. Hold this position for __________ seconds. 4. Slowly push with your legs to return to standing. Do not use your hands to pull yourself to standing. Repeat __________ times. Complete this exercise __________ times a day. Exercise D: Hip hike 1. Stand sideways on a bottom step. Stand on your left / right leg with your other foot unsupported next to the step. You can hold onto the railing or wall if needed for balance. 2. Keeping your knees straight and your torso square, lift your left / right hip up toward the ceiling. 3. Hold this position for __________ seconds. 4. Slowly let your left / right hip lower toward the floor,  past the starting position. Your foot should get closer to the floor. Do not lean or bend your knees. Repeat __________ times. Complete this exercise __________ times a day. Exercise E: Single leg stand 1. Stand near a counter or door frame that you can hold onto for balance as needed. It is helpful to stand in front of a mirror for this exercise so you can watch your hip. 2. Squeeze your left / right buttock muscles then lift up your other foot. Do not let your left / right hip push out to the side. 3. Hold this position for __________ seconds. Repeat __________ times. Complete this exercise __________ times a day. This information is not intended to replace advice given to you by your health care provider. Make sure you discuss any questions you have with your health care provider. Document Released: 02/13/2004 Document Revised: 09/12/2015 Document Reviewed: 12/21/2014 Elsevier Interactive Patient Education  [employer].

## 2017-09-01 NOTE — Telephone Encounter (Signed)
Patient is scheduled for 09/01/2017 @ 2:00pm.

## 2017-09-01 NOTE — Progress Notes (Signed)
Office Visit Note  Patient: Angela Simmons             Date of Birth: May 18, 1945           MRN: [id number]             PCP: Binnie Rail, MD Referring: Binnie Rail, MD Visit Date: 09/01/2017 Occupation: @GUAROCC @  Subjective:  Bilateral trochanteric bursitis   History of Present Illness: Angela Simmons is a 72 y.o. female with history of PMR and autoimmune disease. She takes PLQ 200 mg 1 tablet BID.  She denies any recent flares of autoimmune disease.  She denies any sores in her mouth or nose.  She continues to have sicca symptoms.  She denies any rashes at this time.  She denies any joint pain or joint swelling.  Denies any recent hair loss.  She denies any palpitations or shortness of breath.  She denies any worsening fatigue or low-grade fevers. She presents today with bilateral trochanteric bursitis.  She states the pain is most severe at night when she is lying on her side.  She denies any recent falls or muscle weakness.  She reports muscle aches in bilateral lower extremities.  She states that the pain is most severe in her left side.  She denies any lower back pain at this time.  She denies any numbness or tingling that is radiating.   Activities of Daily Living:  Patient reports morning stiffness for 30 minutes.   Patient Denies nocturnal pain.  Difficulty dressing/grooming: Denies Difficulty climbing stairs: Reports Difficulty getting out of chair: Reports Difficulty using hands for taps, buttons, cutlery, and/or writing: Reports  Review of Systems  Constitutional: Positive for fatigue.  HENT: Positive for mouth dryness. Negative for mouth sores and nose dryness.   Eyes: Positive for dryness. Negative for pain and visual disturbance.  Respiratory: Negative for cough, hemoptysis, shortness of breath and difficulty breathing.   Cardiovascular: Positive for swelling in legs/feet. Negative for chest pain, palpitations and hypertension.  Gastrointestinal: Negative for  blood in stool, constipation and diarrhea.  Endocrine: Negative for increased urination.  Genitourinary: Negative for difficulty urinating and painful urination.  Musculoskeletal: Positive for arthralgias, joint pain, joint swelling, muscle weakness, morning stiffness and muscle tenderness. Negative for myalgias and myalgias.  Skin: Negative for color change, pallor, rash, hair loss, nodules/bumps, skin tightness, ulcers and sensitivity to sunlight.  Allergic/Immunologic: Negative for susceptible to infections.  Neurological: Negative for dizziness, numbness and headaches.  Hematological: Negative for bruising/bleeding tendency and swollen glands.  Psychiatric/Behavioral: Positive for sleep disturbance. Negative for depressed mood. The patient is not nervous/anxious.     PMFS History:  Patient Active Problem List   Diagnosis Date Noted  . Ischial bursitis of left side 06/02/2017  . Rash and nonspecific skin eruption 04/23/2017  . Sicca syndrome ([place]) 06/23/2016  . Bilateral leg edema 04/07/2016  . Burning sensation of skin 04/07/2016  . PMR (polymyalgia rheumatica) ([facility]) 03/27/2016  . Osteopenia 03/04/2016  . Autoimmune disease ([place]) 03/18/2015  . Inflammatory arthritis 03/18/2015  . Joint pain 11/22/2014  . Overweight (BMI 25.0-29.9) 08/18/2014  . Low back pain with radiation 08/18/2014  . Vulvar lesion 06/28/2014  . Hyperlipidemia 06/28/2014  . Chest pain, atypical 04/11/2014  . Tendonitis of shoulder 08/29/2013  . S/P right mastectomy 11/14/2012  . Type 2 diabetes, controlled, with neuropathy ([place]) 10/05/2012  . Allergic rhinitis 01/26/2011  . GERD 11/10/2009  . INTERSTITIAL CYSTITIS 10/31/2008  . BENIGN NEOPLASM OF VULVA 02/01/2008  .  Essential hypertension, benign 02/08/2007  . History of cancer of right breast 02/08/2007    Past Medical History:  Diagnosis Date  . Breast cancer ([place]) 1990s   right breast  . Diabetes mellitus without complication ([place])   . GERD  (gastroesophageal reflux disease)   . HTN (hypertension)   . Hypercholesterolemia   . Obesity     Family History  Problem Relation Age of Onset  . Diabetes Mother        diabetic coma  . Heart failure Mother   . Diabetes Father   . Emphysema Father   . Diabetes Sister   . Hypercholesterolemia Sister   . Hypercholesterolemia Brother   . Diabetes Brother   . Hypertension Brother   . Hypercholesterolemia Brother   . Diabetes Sister   . Hypertension Sister   . Hypercholesterolemia Sister   . Hypercholesterolemia Sister   . Hypercholesterolemia Sister   . Diabetes Brother   . Hypertension Brother   . Colon cancer Neg Hx    Past Surgical History:  Procedure Laterality Date  . ABDOMINAL [place]CTOMY  [place]   [place]  . COLONOSCOPY  May 2003   Dr. Tamala Julian: normal  . COLONOSCOPY  06/22/2011   Procedure: COLONOSCOPY;  Surgeon: Danie Binder, MD;  [facility];  Service: Endoscopy;  Laterality: N/A;  10:30  . MASTECTOMY  1995   right    Social History   Social History Narrative   Lives at home with husband   Caffeine, maybe 1 a day    Objective: Vital Signs: BP 140/82 (BP Location: Left Arm, Patient Position: Sitting, Cuff Size: Normal)   Pulse 83   Resp 16   Ht 5\' 9"  (1.753 m)   Wt 202 lb 6.4 oz (91.8 kg)   BMI 29.89 kg/m    Physical Exam  Constitutional: She is oriented to person, place, and time. She appears well-developed and well-nourished.  HENT:  Head: Normocephalic and atraumatic.  Eyes: Conjunctivae and EOM are normal.  Neck: Normal range of motion.  Cardiovascular: Normal rate, regular rhythm, normal heart sounds and intact distal pulses.  Pulmonary/Chest: Effort normal and breath sounds normal.  Abdominal: Soft. Bowel sounds are normal.  Lymphadenopathy:    She has no cervical adenopathy.  Neurological: She is alert and oriented to person, place, and time.  Skin: Skin is warm and dry. Capillary refill takes less than 2 seconds.    Psychiatric: She has a normal mood and affect. Her behavior is normal.  Nursing note and vitals reviewed.    Musculoskeletal Exam: C-spine, thoracic spine, lumbar spine good range of motion.  No midline spinal tenderness.  No SI joint tenderness.  Negative straight leg raise.  Shoulder joints, elbow joints, wrist joints, MCPs, PIPs, DIPs good range of motion with no synovitis.  Hip joints, knee joints, ankle joints, MTPs, PIPs and DIPs good range of motion with no synovitis.  No warmth or effusion of bilateral knee joints.  Bilateral knee crepitus.  Tenderness of bilateral trochanteric bursa.  Full strength of upper and lower extremities.  No difficulty getting up from a chair.  CDAI Exam: CDAI Score: Not documented Patient Global Assessment: Not documented; Provider Global Assessment: Not documented Swollen: 0 ; Tender: 0  Joint Exam   Not documented   There is currently no information documented on the homunculus. Go to the Rheumatology activity and complete the homunculus joint exam.  Investigation: No additional findings.  Imaging: No results found.  Recent Labs: Lab Results  Component Value Date   WBC 3.8 06/02/2017   HGB 12.0 06/02/2017   PLT 242 06/02/2017   NA 140 08/26/2017   K 3.9 08/26/2017   CL 104 08/26/2017   CO2 29 08/26/2017   GLUCOSE 83 08/26/2017   BUN 12 08/26/2017   CREATININE 1.02 08/26/2017   BILITOT 0.5 08/26/2017   ALKPHOS 62 08/26/2017   AST 20 08/26/2017   ALT 18 08/26/2017   PROT 7.7 08/26/2017   ALBUMIN 3.9 08/26/2017   CALCIUM 9.6 08/26/2017   GFRAA 71 06/02/2017    Speciality Comments: PLQ EYe Exam: 12/25/16 WNL @ [facility] follow up in 6 months.   Procedures:  Large Joint Inj: bilateral greater trochanter on 09/01/2017 2:36 PM Indications: pain Details: 27 G 1.5 in needle, lateral approach  Arthrogram: No  Medications (Right): 1.5 mL lidocaine 1 %; 40 mg triamcinolone acetonide 40 MG/ML Aspirate (Right): 0 mL Medications  (Left): 1.5 mL lidocaine 1 %; 40 mg triamcinolone acetonide 40 MG/ML Aspirate (Left): 0 mL Outcome: tolerated well, no immediate complications Procedure, treatment alternatives, risks and benefits explained, specific risks discussed. Consent was given by the patient. Immediately prior to procedure a time out was called to verify the correct patient, procedure, equipment, support staff and site/side marked as required. Patient was prepped and draped in the usual sterile fashion.     Allergies: Levofloxacin; Nitrofurantoin; Penicillins; and Sulfonamide derivatives   Assessment / Plan:     Visit Diagnoses: PMR (polymyalgia rheumatica) ([place]): She has lower extremity myalgias.  She has bilateral trochanteric bursitis and left IT band syndrome, which is likely contributing to her symptoms.  She has no muscle weakness.  Full strength of upper and lower extremities bilaterally.  She has no difficulty getting up from a chair or raising arms above her head.  She has no temporal artery tenderness and has not had any recent headaches or blurry vision.  She is clinically doing well and does not need prednisone at this time.   Autoimmune disease ([place]) - ANA+, sicca symptoms: She is clinically doing well on PLQ 200 mg 1 tablet BID.  She has not had any recent flares.  No synovitis.  No joint pain at this time. She has no oral or nasal ulcerations.  No rashes or hair loss recently.  She continues to have sicca symptoms.  No symptoms of Raynaud's.  No digital ulcerations evident.  No shortness of breath or palpitations.    Sicca syndrome [facility]): She continues to have chronic sicca symptoms.  She has no parotid swelling on exam.    High risk medication use: She is on PLQ 200 mg 1 tablet BID.  CBC WNL on 06/02/17.  CMp WNL on 08/26/17.  Trochanteric bursitis of both hips: She has tenderness of bilateral trochanteric bursa.  She requested bilateral cortisone injections today in the office.  Potential side effects were  discussed.  She was advised to monitor her blood pressure closely at home.  She tolerated the procedure well.  She was given a handout of exercises that she can perform at home.  She can continue using voltaren gel topically TID PRN.  Ischial bursitis of left side: She has tenderness on exam.    Osteopenia of multiple sites: She is taking a calcium and vitamin D daily.   Other medical conditions are listed as follows:   History of peripheral neuropathy  Other fatigue  History of cystitis  History of gastroesophageal reflux (GERD)  History of hyperlipidemia  History of hypertension  History of cancer    Orders: Orders Placed This Encounter  Procedures  . Large Joint Inj   No orders of the defined types were placed in this encounter.   Face-to-face time spent with patient was 30 minutes. Greater than 50% of time was spent in counseling and coordination of care.  Follow-Up Instructions: Return in about 5 months (around 02/01/2018) for Polymyalgia Rheumatica, Autoimmune Disease.   Ofilia Neas, PA-C   I examined and evaluated the patient with Hazel Sams PA.Patient had tenderness on palpation over bilateral trochanteric bursa consistent with trochanteric bursitis. The plan of care was discussed as noted above.  Bo Merino, MD  Note - This record has been created using Editor, commissioning.  Chart creation errors have been sought, but may not always  have been located. Such creation errors do not reflect on  the standard of medical care.

## 2017-09-23 ENCOUNTER — Other Ambulatory Visit: Payer: Self-pay | Admitting: Internal Medicine

## 2017-10-05 DIAGNOSIS — L84 Corns and callosities: Secondary | ICD-10-CM | POA: Diagnosis not present

## 2017-10-05 DIAGNOSIS — M79673 Pain in unspecified foot: Secondary | ICD-10-CM | POA: Diagnosis not present

## 2017-10-05 DIAGNOSIS — E1142 Type 2 diabetes mellitus with diabetic polyneuropathy: Secondary | ICD-10-CM | POA: Diagnosis not present

## 2017-10-05 DIAGNOSIS — B351 Tinea unguium: Secondary | ICD-10-CM | POA: Diagnosis not present

## 2017-10-06 DIAGNOSIS — C50911 Malignant neoplasm of unspecified site of right female breast: Secondary | ICD-10-CM | POA: Diagnosis not present

## 2017-10-20 DIAGNOSIS — C50911 Malignant neoplasm of unspecified site of right female breast: Secondary | ICD-10-CM | POA: Diagnosis not present

## 2017-11-04 ENCOUNTER — Ambulatory Visit: Payer: PPO | Admitting: Rheumatology

## 2017-11-05 ENCOUNTER — Ambulatory Visit: Payer: Self-pay

## 2017-11-05 ENCOUNTER — Ambulatory Visit (INDEPENDENT_AMBULATORY_CARE_PROVIDER_SITE_OTHER): Payer: PPO | Admitting: Internal Medicine

## 2017-11-05 ENCOUNTER — Encounter: Payer: Self-pay | Admitting: Internal Medicine

## 2017-11-05 VITALS — BP 158/92 | HR 77 | Temp 98.0°F | Resp 16 | Ht 69.0 in | Wt 203.0 lb

## 2017-11-05 DIAGNOSIS — K219 Gastro-esophageal reflux disease without esophagitis: Secondary | ICD-10-CM

## 2017-11-05 DIAGNOSIS — I10 Essential (primary) hypertension: Secondary | ICD-10-CM

## 2017-11-05 DIAGNOSIS — E7849 Other hyperlipidemia: Secondary | ICD-10-CM | POA: Diagnosis not present

## 2017-11-05 DIAGNOSIS — Z23 Encounter for immunization: Secondary | ICD-10-CM

## 2017-11-05 MED ORDER — ATORVASTATIN CALCIUM 10 MG PO TABS: 10.0000 mg | ORAL_TABLET | Freq: Every day | ORAL | 3 refills | Status: DC

## 2017-11-05 MED ORDER — OMEPRAZOLE 40 MG PO CPDR: 40.0000 mg | DELAYED_RELEASE_CAPSULE | Freq: Every day | ORAL | 5 refills | Status: DC

## 2017-11-05 MED ORDER — CLONIDINE HCL 0.3 MG PO TABS: 0.3000 mg | ORAL_TABLET | Freq: Two times a day (BID) | ORAL | 5 refills | Status: DC

## 2017-11-05 NOTE — Assessment & Plan Note (Addendum)
protonix caused bad dreams - will d/c Start omeprazole 40 mg daily We will follow-up next month-advised her to call sooner if she has side effects

## 2017-11-05 NOTE — Assessment & Plan Note (Addendum)
Wants to try a different statin - ?  Side effects to lovastatin.  She states she has been on it for a while and just wants to change it Discontinue lovastatin Start atorvastatin 10 mg daily Follow-up next month we will do blood work at that time

## 2017-11-05 NOTE — Telephone Encounter (Signed)
Pt. Reports her BP has been up and down all month.This morning it was 161/93 and she has a slight headache. Taking all of her medications as ordered. Appointment made for today. Thanks.  Reason for Disposition . Systolic BP  >= 557 OR Diastolic >= 322  Answer Assessment - Initial Assessment Questions 1. BLOOD PRESSURE: "What is the blood pressure?" "Did you take at least two measurements 5 minutes apart?"     161/93 2. ONSET: "When did you take your blood pressure?"     tHIS MORNING 3. HOW: "How did you obtain the blood pressure?" (e.g., visiting nurse, automatic home BP monitor)      Home BP monitor 4. HISTORY: "Do you have a history of high blood pressure?"     Yes 5. MEDICATIONS: "Are you taking any medications for blood pressure?" "Have you missed any doses recently?"     No missed doses 6. OTHER SYMPTOMS: "Do you have any symptoms?" (e.g., headache, chest pain, blurred vision, difficulty breathing, weakness)     Headache 7. PREGNANCY: "Is there any chance you are pregnant?" "When was your last menstrual period?"     No  Protocols used: HIGH BLOOD PRESSURE-A-AH

## 2017-11-05 NOTE — Patient Instructions (Signed)
  Medications reviewed and updated.  Changes include :     Increasing clonidine to 0.3 mg twice a day Changing lovastatin to atorvastatin 10 mg daily Changing pantoprazole to omeprazole 40 mg daily  Your prescription(s) have been submitted to your pharmacy. Please take as directed and contact our office if you believe you are having problem(s) with the medication(s).    Please followup in November as scheduled

## 2017-11-05 NOTE — Assessment & Plan Note (Signed)
Not controlled-has been elevated at home Increase clonidine to 0.3 mg twice daily Continue benazepril and amlodipine at current dose Encourage regular exercise and weight loss Stressed low-sodium diet Follow-up next month

## 2017-11-05 NOTE — Progress Notes (Signed)
Subjective:    Patient ID: Angela Simmons, female    DOB: 04-Jul-1945, 72 y.o.   MRN: [id number]  HPI The patient is here for an acute visit.  Her BP has been high at home- 154/103, 147/86, 144/85, 141/92, 143/91, 153/90.   She has been taking her medication daily as prescribed.  She denies any changes that may have caused her blood pressure the elevated.  She has been experiencing some headaches when the blood pressure is high.  She had one episode of chest pain, but has not had any since then.  She has occasional palpitations when she overexerts herself and at times will get short of breath if she walks too much.  She is not currently exercising.  She does get some mild leg swelling at times.  She is taking lovastatin and pantoprazole and has been on them for a while.  She feels the pantoprazole is causing nightmares and she would like to change it.  When she stopped it for a little while the nightmares went away and when she restarted it they came back.  She is unsure if she is having having any side effects of the lovastatin, but still wants to change it.    Medications and allergies reviewed with patient and updated if appropriate.  Patient Active Problem List   Diagnosis Date Noted  . Ischial bursitis of left side 06/02/2017  . Rash and nonspecific skin eruption 04/23/2017  . Sicca syndrome ([place]) 06/23/2016  . Bilateral leg edema 04/07/2016  . Burning sensation of skin 04/07/2016  . PMR (polymyalgia rheumatica) ([facility]) 03/27/2016  . Osteopenia 03/04/2016  . Autoimmune disease ([place]) 03/18/2015  . Inflammatory arthritis 03/18/2015  . Joint pain 11/22/2014  . Overweight (BMI 25.0-29.9) 08/18/2014  . Low back pain with radiation 08/18/2014  . Vulvar lesion 06/28/2014  . Hyperlipidemia 06/28/2014  . Chest pain, atypical 04/11/2014  . Tendonitis of shoulder 08/29/2013  . S/P right mastectomy 11/14/2012  . Type 2 diabetes, controlled, with neuropathy ([place]) 10/05/2012  . Allergic  rhinitis 01/26/2011  . GERD 11/10/2009  . INTERSTITIAL CYSTITIS 10/31/2008  . BENIGN NEOPLASM OF VULVA 02/01/2008  . Essential hypertension, benign 02/08/2007  . History of cancer of right breast 02/08/2007    Current Outpatient Medications on File Prior to Visit  Medication Sig Dispense Refill  . amLODipine (NORVASC) 5 MG tablet TAKE 1 TABLET BY MOUTH EVERY DAY 90 tablet 3  . aspirin (ADULT ASPIRIN EC LOW STRENGTH) 81 MG EC tablet Take 81 mg by mouth daily.     . benazepril (LOTENSIN) 40 MG tablet TAKE ONE TABLET BY MOUTH EVERY DAY 90 tablet 1  . Blood Glucose Monitoring Suppl (ONETOUCH VERIO) w/Device KIT USE TO TEST BLOOD SUGAR ONCE DAILY AS DIRECTED 1 kit 0  . calcium-vitamin D (OSCAL WITH D) 500-200 MG-UNIT tablet Take 1 tablet by mouth daily with breakfast.    . cloNIDine (CATAPRES) 0.1 MG tablet TAKE ONE TABLET BY MOUTH TWICE DAILY 180 tablet 1  . diclofenac sodium (VOLTAREN) 1 % GEL Apply 3 g to 3 large joints up to 3 times daily. 3 Tube 3  . fluticasone (FLONASE) 50 MCG/ACT nasal spray USE 2 SPRAYS IN EACH NOSTRIL EVERY DAY 48 g 0  . glucose blood (FORA V30A BLOOD GLUCOSE TEST) test strip Once daily testing 100 each 0  . hydroxychloroquine (PLAQUENIL) 200 MG tablet Take 1 tablet (200 mg total) by mouth 2 (two) times daily. 180 tablet 1  . Lancets (UNILET EXCELITE II)  MISC TEST ONCE DAILY 100 each 0  . loratadine (CLARITIN) 10 MG tablet TAKE 1 TABLET BY MOUTH EVERY DAY (Patient taking differently: TAKE 1 TABLET BY MOUTH EVERY DAY PRN) 30 tablet 2  . lovastatin (MEVACOR) 40 MG tablet TAKE ONE TABLET BY MOUTH EVERY DAY 90 tablet 1  . Multiple Vitamin (MULTIVITAMIN) tablet Take 1 tablet by mouth daily.    . Omega-3 Fatty Acids (FISH OIL) 1000 MG CAPS Take 1 capsule by mouth daily.     . pantoprazole (PROTONIX) 40 MG tablet TAKE ONE TABLET BY MOUTH EVERY DAY 90 tablet 1  . ranitidine (ZANTAC) 150 MG tablet Take 150 mg by mouth at bedtime.    . triamcinolone ointment (KENALOG) 0.5 %  Apply 1 application topically 2 (two) times daily. Do not use for more than 14 days in a row 15 g 0   No current facility-administered medications on file prior to visit.     Past Medical History:  Diagnosis Date  . Breast cancer ([place]) 1990s   right breast  . Diabetes mellitus without complication ([place])   . GERD (gastroesophageal reflux disease)   . HTN (hypertension)   . Hypercholesterolemia   . Obesity     Past Surgical History:  Procedure Laterality Date  . ABDOMINAL [place]CTOMY  [place]   [place]ids  . COLONOSCOPY  May 2003   Dr. Tamala Julian: normal  . COLONOSCOPY  06/22/2011   Procedure: COLONOSCOPY;  Surgeon: Danie Binder, MD;  Location[facility];  Service: Endoscopy;  Laterality: N/A;  10:30  . MASTECTOMY  1995   right     Social History   Socioeconomic History  . Marital status: Married    Spouse name: Not on file  . Number of children: 3  . Years of education: 7  . Highest education level: Not on file  Occupational History  . Not on file  Social Needs  . Financial resource strain: Not on file  . Food insecurity:    Worry: Not on file    Inability: Not on file  . Transportation needs:    Medical: Not on file    Non-medical: Not on file  Tobacco Use  . Smoking status: Never Smoker  . Smokeless tobacco: Never Used  Substance and Sexual Activity  . Alcohol use: No  . Drug use: Never  . Sexual activity: Never    Birth control/protection: Rhythm  Lifestyle  . Physical activity:    Days per week: Not on file    Minutes per session: Not on file  . Stress: Not on file  Relationships  . Social connections:    Talks on phone: Not on file    Gets together: Not on file    Attends religious service: Not on file    Active member of club or organization: Not on file    Attends meetings of clubs or organizations: Not on file    Relationship status: Not on file  Other Topics Concern  . Not on file  Social History Narrative   Lives at home with husband    Caffeine, maybe 1 a day    Family History  Problem Relation Age of Onset  . Diabetes Mother        diabetic coma  . Heart failure Mother   . Diabetes Father   . Emphysema Father   . Diabetes Sister   . Hypercholesterolemia Sister   . Hypercholesterolemia Brother   . Diabetes Brother   . Hypertension Brother   .  Hypercholesterolemia Brother   . Diabetes Sister   . Hypertension Sister   . Hypercholesterolemia Sister   . Hypercholesterolemia Sister   . Hypercholesterolemia Sister   . Diabetes Brother   . Hypertension Brother   . Colon cancer Neg Hx     Review of Systems  Constitutional: Negative for chills and fever.  Respiratory: Positive for cough ( alittle) and shortness of breath (is she walks too far). Negative for wheezing.   Cardiovascular: Positive for chest pain (once), palpitations (occasional - faster heart beat) and leg swelling (mild ankle swelling, none today).  Neurological: Positive for dizziness (occ) and headaches (with higher bp).       Objective:   Vitals:   11/05/17 1046  BP: (!) 158/92  Pulse: 77  Resp: 16  Temp: 98 F (36.7 C)  SpO2: 97%   BP Readings from Last 3 Encounters:  11/05/17 (!) 158/92  09/01/17 140/82  08/26/17 132/72   Wt Readings from Last 3 Encounters:  11/05/17 203 lb (92.1 kg)  09/01/17 202 lb 6.4 oz (91.8 kg)  08/26/17 201 lb (91.2 kg)   Body mass index is 29.98 kg/m.   Physical Exam    Constitutional: Appears well-developed and well-nourished. No distress.  HENT:  Head: Normocephalic and atraumatic.  Neck: Neck supple. No tracheal deviation present. No thyromegaly present.  No cervical lymphadenopathy Cardiovascular: Normal rate, regular rhythm and normal heart sounds.   1/6 systolic murmur heard. No carotid bruit .  No edema Pulmonary/Chest: Effort normal and breath sounds normal. No respiratory distress. No has no wheezes. No rales.  Skin: Skin is warm and dry. Not diaphoretic.  Psychiatric: Normal mood and  affect. Behavior is normal.       Assessment & Plan:    See Problem List for Assessment and Plan of chronic medical problems.

## 2017-11-09 ENCOUNTER — Ambulatory Visit: Payer: Self-pay | Admitting: *Deleted

## 2017-11-09 NOTE — Telephone Encounter (Signed)
Pt aware of response below. Has an appointment 11/8 for a follow up.

## 2017-11-09 NOTE — Telephone Encounter (Signed)
If she is continuing to have lightheadedness/dizziness she can stop the clonidine, but will need to increase amlodipine to 10 mg ( can take two tabs)   She should monitor bp at home and follow up with me in the office in 1-2 weeks.

## 2017-11-09 NOTE — Telephone Encounter (Signed)
Pt reports lightheadedness "Since I started the clonidine Saturday morning." Saw Dr. Quay Burow Friday, meds adjusted; clonidine dose increased and lovastatin d/ced, started on atorvastin 10mg  qd. Pt states she "Knows it was the clonidine because I took it by itself Sat am and the dizziness started." Also reports headache over weekend, mild presently. States "Very slight dizziness this AM". Pt's  BP this am 145/84   HR "Has been in 70s."  BP yesterday 126/74.  Pt states last time she took the clonidine was Monday am.  Has been taking all other medications. Pt questioning if she should continue clonidine. Care advise given per protocol. Please advise: [phone]  Reason for Disposition . Taking a medicine that could cause dizziness (e.g., blood pressure medications, diuretics)  Answer Assessment - Initial Assessment Questions 1. DESCRIPTION: "Describe your dizziness."     Lightheaded this weekend, "Very slightly" presently 2. LIGHTHEADED: "Do you feel lightheaded?" (e.g., somewhat faint, woozy, weak upon standing)     Yes 3. VERTIGO: "Do you feel like either you or the room is spinning or tilting?" (i.e. vertigo)     no 4. SEVERITY: "How bad is it?"  "Do you feel like you are going to faint?" "Can you stand and walk?"   - MILD - walking normally   - MODERATE - interferes with normal activities (e.g., work, school)    - SEVERE - unable to stand, requires support to walk, feels like passing out now.      "Wobbly" this weekend, not presently 5. ONSET:  "When did the dizziness begin?"     Saturday AM after taking clonidine 6. AGGRAVATING FACTORS: "Does anything make it worse?" (e.g., standing, change in head position)     no 7. HEART RATE: "Can you tell me your heart rate?" "How many beats in 15 seconds?"  (Note: not all patients can do this)       70s 8. CAUSE: "What do you think is causing the dizziness?"     Clonidine  9. RECURRENT SYMPTOM: "Have you had dizziness before?" If so, ask: "When was  the last time?" "What happened that time?"     no 10. OTHER SYMPTOMS: "Do you have any other symptoms?" (e.g., fever, chest pain, vomiting, diarrhea, bleeding)       Headache across forehead, mild presently, did not sleep well first 2 nights after taking med.  Protocols used: DIZZINESS [facility]

## 2017-11-22 ENCOUNTER — Other Ambulatory Visit: Payer: Self-pay | Admitting: Rheumatology

## 2017-11-22 NOTE — Telephone Encounter (Signed)
Last Visit: 09/01/17 Next visit: 02/02/18 Labs: 06/02/17 creat 0.94 rest of labs normal  PLQ EYe Exam: 12/25/16 WNL  Okay to refill per Dr. Estanislado Pandy

## 2017-11-24 ENCOUNTER — Ambulatory Visit: Payer: PPO | Admitting: Internal Medicine

## 2017-11-25 NOTE — Progress Notes (Signed)
Subjective:    Patient ID: Angela Simmons, female    DOB: 1945-08-26, 72 y.o.   MRN: [id number]  HPI The patient is here for follow up.  Hypertension: We increased her clonidine to 0.3 mg twice daily 3 weeks ago.  She is taking her medication daily. She is compliant with a low sodium diet.  She denies chest pain except for muscular chest pain.  She denies palpitations, edema, shortness of breath and regular headaches. She is not exercising regularly.  She does not monitor her blood pressure at home.    Hyperlipidemia: 3 weeks ago we changed lovastatin to atorvastatin. She is taking her medication daily. She is compliant with a low fat/cholesterol diet. She is not exercising regularly. She denies myalgias.   GERD:  3 weeks ago we changed pantoprazole to omeprazole.  She is taking her medication daily as prescribed.  She has had some GERD symptoms and thinks she needs to cut down on peanut butter.   Diabetes: She is controlling her sugars with diet. She is compliant with a diabetic diet. She is not exercising regularly.  She checks her feet daily and denies foot lesions but does have tingling in her feet and hands. She is up-to-date with an ophthalmology examination.   Rash on neck:  It does itch.  She put on triamcinolone cream on it.  It did help.    Medications and allergies reviewed with patient and updated if appropriate.  Patient Active Problem List   Diagnosis Date Noted  . Ischial bursitis of left side 06/02/2017  . Rash and nonspecific skin eruption 04/23/2017  . Sicca syndrome ([place]) 06/23/2016  . Bilateral leg edema 04/07/2016  . Burning sensation of skin 04/07/2016  . PMR (polymyalgia rheumatica) (HCC) 03/27/2016  . Osteopenia 03/04/2016  . Autoimmune disease ([place]) 03/18/2015  . Inflammatory arthritis 03/18/2015  . Joint pain 11/22/2014  . Overweight (BMI 25.0-29.9) 08/18/2014  . Low back pain with radiation 08/18/2014  . Vulvar lesion 06/28/2014  . Hyperlipidemia  06/28/2014  . Chest pain, atypical 04/11/2014  . Tendonitis of shoulder 08/29/2013  . S/P right mastectomy 11/14/2012  . Type 2 diabetes, controlled, with neuropathy ([place]) 10/05/2012  . Allergic rhinitis 01/26/2011  . GERD 11/10/2009  . INTERSTITIAL CYSTITIS 10/31/2008  . BENIGN NEOPLASM OF VULVA 02/01/2008  . Essential hypertension, benign 02/08/2007  . History of cancer of right breast 02/08/2007    Current Outpatient Medications on File Prior to Visit  Medication Sig Dispense Refill  . amLODipine (NORVASC) 5 MG tablet TAKE 1 TABLET BY MOUTH EVERY DAY 90 tablet 3  . aspirin (ADULT ASPIRIN EC LOW STRENGTH) 81 MG EC tablet Take 81 mg by mouth daily.     Marland Kitchen atorvastatin (LIPITOR) 10 MG tablet Take 1 tablet (10 mg total) by mouth daily. 90 tablet 3  . benazepril (LOTENSIN) 40 MG tablet TAKE ONE TABLET BY MOUTH EVERY DAY 90 tablet 1  . Blood Glucose Monitoring Suppl (ONETOUCH VERIO) w/Device KIT USE TO TEST BLOOD SUGAR ONCE DAILY AS DIRECTED 1 kit 0  . calcium-vitamin D (OSCAL WITH D) 500-200 MG-UNIT tablet Take 1 tablet by mouth daily with breakfast.    . cloNIDine (CATAPRES) 0.3 MG tablet Take 1 tablet (0.3 mg total) by mouth 2 (two) times daily. 60 tablet 5  . diclofenac sodium (VOLTAREN) 1 % GEL Apply 3 g to 3 large joints up to 3 times daily. 3 Tube 3  . fluticasone (FLONASE) 50 MCG/ACT nasal spray USE 2 SPRAYS IN  EACH NOSTRIL EVERY DAY 48 g 0  . glucose blood (FORA V30A BLOOD GLUCOSE TEST) test strip Once daily testing 100 each 0  . hydroxychloroquine (PLAQUENIL) 200 MG tablet TAKE 1 TABLET BY MOUTH TWICE DAILY 60 tablet 0  . Lancets (UNILET EXCELITE II) MISC TEST ONCE DAILY 100 each 0  . loratadine (CLARITIN) 10 MG tablet TAKE 1 TABLET BY MOUTH EVERY DAY (Patient taking differently: TAKE 1 TABLET BY MOUTH EVERY DAY PRN) 30 tablet 2  . Multiple Vitamin (MULTIVITAMIN) tablet Take 1 tablet by mouth daily.    . Omega-3 Fatty Acids (FISH OIL) 1000 MG CAPS Take 1 capsule by mouth daily.       Marland Kitchen omeprazole (PRILOSEC) 40 MG capsule Take 1 capsule (40 mg total) by mouth daily. 30 capsule 5  . triamcinolone ointment (KENALOG) 0.5 % Apply 1 application topically 2 (two) times daily. Do not use for more than 14 days in a row 15 g 0   No current facility-administered medications on file prior to visit.     Past Medical History:  Diagnosis Date  . Breast cancer ([place]) 1990s   right breast  . Diabetes mellitus without complication ([place])   . GERD (gastroesophageal reflux disease)   . HTN (hypertension)   . Hypercholesterolemia   . Obesity     Past Surgical History:  Procedure Laterality Date  . ABDOM[place]  [place]  . COLONOSCOPY  May 2003   Dr. Tamala Julian: normal  . COLONOSCOPY  06/22/2011   Procedure: COLONOSCOPY;  Surgeon: Danie Binder, MD;  [facility];  Service: Endoscopy;  Laterality: N/A;  10:30  . MASTECTOMY  1995   right     Social History   Socioeconomic History  . Marital status: Married    Spouse name: Not on file  . Number of children: 3  . Years of education: 7  . Highest education level: Not on file  Occupational History  . Not on file  Social Needs  . Financial resource strain: Not on file  . Food insecurity:    Worry: Not on file    Inability: Not on file  . Transportation needs:    Medical: Not on file    Non-medical: Not on file  Tobacco Use  . Smoking status: Never Smoker  . Smokeless tobacco: Never Used  Substance and Sexual Activity  . Alcohol use: No  . Drug use: Never  . Sexual activity: Never    Birth control/protection: Rhythm  Lifestyle  . Physical activity:    Days per week: Not on file    Minutes per session: Not on file  . Stress: Not on file  Relationships  . Social connections:    Talks on phone: Not on file    Gets together: Not on file    Attends religious service: Not on file    Active member of club or organization: Not on file    Attends meetings of clubs or organizations: Not on  file    Relationship status: Not on file  Other Topics Concern  . Not on file  Social History Narrative   Lives at home with husband   Caffeine, maybe 1 a day    Family History  Problem Relation Age of Onset  . Diabetes Mother        diabetic coma  . Heart failure Mother   . Diabetes Father   . Emphysema Father   . Diabetes Sister   . Hypercholesterolemia  Sister   . Hypercholesterolemia Brother   . Diabetes Brother   . Hypertension Brother   . Hypercholesterolemia Brother   . Diabetes Sister   . Hypertension Sister   . Hypercholesterolemia Sister   . Hypercholesterolemia Sister   . Hypercholesterolemia Sister   . Diabetes Brother   . Hypertension Brother   . Colon cancer Neg Hx     Review of Systems  Constitutional: Negative for chills and fever.  Respiratory: Positive for cough (occ, PND). Negative for shortness of breath and wheezing.   Cardiovascular: Positive for chest pain (muscular - tender to touch). Negative for palpitations and leg swelling.  Gastrointestinal: Negative for abdominal pain and nausea.  Skin: Positive for rash (neck).  Neurological: Positive for dizziness (occ) and headaches (rare). Negative for light-headedness.       Objective:   Vitals:   11/26/17 0942  BP: 140/80  Pulse: 83  Resp: 16  Temp: 97.9 F (36.6 C)  SpO2: 98%   BP Readings from Last 3 Encounters:  11/26/17 140/80  11/05/17 (!) 158/92  09/01/17 140/82   Wt Readings from Last 3 Encounters:  11/26/17 203 lb 12.8 oz (92.4 kg)  11/05/17 203 lb (92.1 kg)  09/01/17 202 lb 6.4 oz (91.8 kg)   Body mass index is 30.1 kg/m.   Physical Exam    Constitutional: Appears well-developed and well-nourished. No distress.  HENT:  Head: Normocephalic and atraumatic.  Neck: Neck supple. No tracheal deviation present. No thyromegaly present.  No cervical lymphadenopathy Cardiovascular: Normal rate, regular rhythm and normal heart sounds.   No murmur heard. No carotid bruit .  No  edema Pulmonary/Chest: Effort normal and breath sounds normal. No respiratory distress. No has no wheezes. No rales.  Skin: Skin is warm and dry. Not diaphoretic. Mild papular rash on neck Psychiatric: Normal mood and affect. Behavior is normal.    Diabetic Foot Exam - Simple   Simple Foot Form Diabetic Foot exam was performed with the following findings:  Yes 11/26/2017 10:18 AM  Visual Inspection No deformities, no ulcerations, no other skin breakdown bilaterally:  Yes Sensation Testing Pulse Check Posterior Tibialis and Dorsalis pulse intact bilaterally:  Yes Comments Decreased sensation b/l plantar surfaces of feet      Assessment & Plan:    See Problem List for Assessment and Plan of chronic medical problems.

## 2017-11-25 NOTE — Patient Instructions (Addendum)
  Tests ordered today. Your results will be released to MyChart (or called to you) after review, usually within 72hours after test completion. If any changes need to be made, you will be notified at that same time.  Medications reviewed and updated.  Changes include :   none      Please followup in 6 months   

## 2017-11-26 ENCOUNTER — Other Ambulatory Visit (INDEPENDENT_AMBULATORY_CARE_PROVIDER_SITE_OTHER): Payer: PPO

## 2017-11-26 ENCOUNTER — Ambulatory Visit (INDEPENDENT_AMBULATORY_CARE_PROVIDER_SITE_OTHER): Payer: PPO | Admitting: Internal Medicine

## 2017-11-26 ENCOUNTER — Encounter: Payer: Self-pay | Admitting: Internal Medicine

## 2017-11-26 VITALS — BP 140/80 | HR 83 | Temp 97.9°F | Resp 16 | Ht 69.0 in | Wt 203.8 lb

## 2017-11-26 DIAGNOSIS — E1140 Type 2 diabetes mellitus with diabetic neuropathy, unspecified: Secondary | ICD-10-CM

## 2017-11-26 DIAGNOSIS — K219 Gastro-esophageal reflux disease without esophagitis: Secondary | ICD-10-CM | POA: Diagnosis not present

## 2017-11-26 DIAGNOSIS — R202 Paresthesia of skin: Secondary | ICD-10-CM

## 2017-11-26 DIAGNOSIS — E7849 Other hyperlipidemia: Secondary | ICD-10-CM

## 2017-11-26 DIAGNOSIS — I10 Essential (primary) hypertension: Secondary | ICD-10-CM

## 2017-11-26 DIAGNOSIS — R21 Rash and other nonspecific skin eruption: Secondary | ICD-10-CM

## 2017-11-26 LAB — COMPREHENSIVE METABOLIC PANEL
ALBUMIN: 4.1 g/dL (ref 3.5–5.2)
ALT: 17 U/L (ref 0–35)
AST: 18 U/L (ref 0–37)
Alkaline Phosphatase: 59 U/L (ref 39–117)
BUN: 11 mg/dL (ref 6–23)
CO2: 31 mEq/L (ref 19–32)
Calcium: 9.4 mg/dL (ref 8.4–10.5)
Chloride: 104 mEq/L (ref 96–112)
Creatinine, Ser: 0.92 mg/dL (ref 0.40–1.20)
GFR: 77.28 mL/min (ref >60.00)
GLUCOSE: 80 mg/dL (ref 70–99)
POTASSIUM: 3.8 meq/L (ref 3.5–5.1)
SODIUM: 141 meq/L (ref 135–145)
Total Bilirubin: 0.4 mg/dL (ref 0.2–1.2)
Total Protein: 7.7 g/dL (ref 6.0–8.3)

## 2017-11-26 LAB — CBC WITH DIFFERENTIAL/PLATELET
BASOS PCT: 1 % (ref 0.0–3.0)
Basophils Absolute: 0 10*3/uL (ref 0.0–0.1)
EOS PCT: 0.7 % (ref 0.0–5.0)
Eosinophils Absolute: 0 10*3/uL (ref 0.0–0.7)
HCT: 36.9 % (ref 36.0–46.0)
HEMOGLOBIN: 12.3 g/dL (ref 12.0–15.0)
Lymphocytes Relative: 24.4 % (ref 12.0–46.0)
Lymphs Abs: 1.1 10*3/uL (ref 0.7–4.0)
MCHC: 33.4 g/dL (ref 30.0–36.0)
MCV: 91.4 fl (ref 78.0–100.0)
Monocytes Absolute: 0.3 10*3/uL (ref 0.1–1.0)
Monocytes Relative: 7.6 % (ref 3.0–12.0)
Neutro Abs: 2.9 10*3/uL (ref 1.4–7.7)
Neutrophils Relative %: 66.3 % (ref 43.0–77.0)
Platelets: 235 10*3/uL (ref 150.0–400.0)
RBC: 4.04 Mil/uL (ref 3.87–5.11)
RDW: 14.3 % (ref 11.5–15.5)
WBC: 4.4 10*3/uL (ref 4.0–10.5)

## 2017-11-26 LAB — VITAMIN B12: Vitamin B-12: 881 pg/mL (ref 211–911)

## 2017-11-26 LAB — LIPID PANEL
Cholesterol: 123 mg/dL (ref 0–200)
HDL: 52.4 mg/dL (ref >39.00)
LDL CALC: 63 mg/dL (ref 0–99)
NonHDL: 70.8
TRIGLYCERIDES: 39 mg/dL (ref 0.0–149.0)
Total CHOL/HDL Ratio: 2
VLDL: 7.8 mg/dL (ref 0.0–40.0)

## 2017-11-26 LAB — HEMOGLOBIN A1C: Hgb A1c MFr Bld: 6.3 % (ref 4.6–6.5)

## 2017-11-26 NOTE — Assessment & Plan Note (Signed)
Hands and feet Check B12 level

## 2017-11-26 NOTE — Assessment & Plan Note (Signed)
On neck Improving with triamcinolone - will continue

## 2017-11-26 NOTE — Assessment & Plan Note (Signed)
Diet controlled Check a1c Low sugar / carb diet Stressed regular exercise  

## 2017-11-26 NOTE — Assessment & Plan Note (Signed)
Check lipid panel  Continue daily statin Regular exercise and healthy diet encouraged  

## 2017-11-26 NOTE — Assessment & Plan Note (Signed)
Some gerd symptoms - will try to work on cutting out certain things to see if that helps We did change her medication at her request at her last visit - can adjust if her symptoms do not improve

## 2017-11-26 NOTE — Assessment & Plan Note (Signed)
BP better Continue current medications at current doses cmp

## 2017-11-29 ENCOUNTER — Ambulatory Visit: Payer: PPO | Admitting: Internal Medicine

## 2017-12-02 ENCOUNTER — Other Ambulatory Visit: Payer: Self-pay | Admitting: Internal Medicine

## 2017-12-02 MED ORDER — GLUCOSE BLOOD VI STRP: ORAL_STRIP | 0 refills | Status: DC

## 2017-12-02 NOTE — Telephone Encounter (Signed)
Copied from [place] [phone]. Topic: Quick Communication - Rx Refill/Question >> Dec 02, 2017  8:57 AM Keene Breath wrote: Medication: glucose blood (FORA V30A BLOOD GLUCOSE TEST) test strip  Patient called to request a refill for the above medication  Preferred Pharmacy (with phone number or street name): [place], [place], [place] - [address] [phone] (Phone) [phone] (Fax)

## 2017-12-06 ENCOUNTER — Ambulatory Visit: Payer: Self-pay

## 2017-12-06 MED ORDER — AMLODIPINE BESYLATE 5 MG PO TABS: 5.0000 mg | ORAL_TABLET | Freq: Two times a day (BID) | ORAL | 3 refills | Status: DC

## 2017-12-06 NOTE — Addendum Note (Signed)
Addended by: Binnie Rail on: 12/06/2017 04:41 PM   Modules accepted: Orders

## 2017-12-06 NOTE — Telephone Encounter (Signed)
Pt. Called and wanted to review her medications. Reports she thought her Clonidine had been stopped. It was increased on 11/05/17. Will start back on the medication.

## 2017-12-06 NOTE — Telephone Encounter (Signed)
Pt calling with complaints of dizziness after taking Clonidine 0.3mg  today. Pt states that this was the first day she has taken the mediation since appt with Dr. Quay Burow on 11/26/17 because she was unsure of which medications she was supposed to taking currently. Pt states that she took the clonidine 0.3 mg tablet around 9:30-10 am today. Pt denies any other symptoms at this time.Pt  States she is currently sitting down in the chair and only experiences dizziness with standing and moving around. Pt reports that this morning prior to taking Clonidine her BP was 121/79 and she was feeling normal. BP checked while on the phone with triage nurse and it was noted to be 119/80 with HR-75.  Per triage encounter on 11/09/17 pt was advised to stop the clonidine due to dizziness and increase amlodipine from 5 mg to 10 mg. Pt states prior to this morning she has been taking Amlodipine 5 mg in the morning and 5 mg at night and had not been taking Clonidine. Pt needs clarification if she should continue to take the Clonidine, with the complaints of dizziness or if she should only be taking Amlodipine 10 mg (one 5 mg tab in the morning and one in the evening). Pt can be contacted at [phone] with recommendations.

## 2017-12-06 NOTE — Telephone Encounter (Signed)
Stop the clonidine.   Continue amlodipine 5 mg twice daily.  Monitor BP at home -- call us at the end of the week with her BP readings.

## 2017-12-06 NOTE — Telephone Encounter (Signed)
Pt aware of response below and expressed understanding with medication changes.

## 2017-12-06 NOTE — Telephone Encounter (Signed)
  Reason for Disposition . Taking a medicine that could cause dizziness (e.g., blood pressure medications, diuretics)  Protocols used: DIZZINESS Heidi Dach

## 2017-12-06 NOTE — Telephone Encounter (Signed)
   Answer Assessment - Initial Assessment Questions 1. DESCRIPTION: "Describe your dizziness."     Was feeling woozy but states she is not dizzy at this time 2. LIGHTHEADED: "Do you feel lightheaded?" (e.g., somewhat faint, woozy, weak upon standing)     Was feeling woozy but is currently sitting in chair 3. VERTIGO: "Do you feel like either you or the room is spinning or tilting?" (i.e. vertigo)     no 4. SEVERITY: "How bad is it?"  "Do you feel like you are going to faint?" "Can you stand and walk?"   - MILD - walking normally   - MODERATE - interferes with normal activities (e.g., work, school)    - SEVERE - unable to stand, requires support to walk, feels like passing out now.      Mild 5. ONSET:  "When did the dizziness begin?"     Today after taking Clonidine 0.3mg , pt states she took the dose arouond 9:30-10 am today.  6. AGGRAVATING FACTORS: "Does anything make it worse?" (e.g., standing, change in head position)     Standing 7. HEART RATE: "Can you tell me your heart rate?" "How many beats in 15 seconds?"  (Note: not all patients can do this)      HR-75,  BP-119/80 8. CAUSE: "What do you think is causing the dizziness?"     Started taking Clonidine today 9. RECURRENT SYMPTOM: "Have you had dizziness before?" If so, ask: "When was the last time?" "What happened that time?" Yes pt states she experienced dizziness with taking Clonidine before and was told to stop the medication in October 10. OTHER SYMPTOMS: "Do you have any other symptoms?" (e.g., fever, chest pain, vomiting, diarrhea, bleeding)       No 11. PREGNANCY: "Is there any chance you are pregnant?" "When was your last menstrual period?" n/a  Protocols used: DIZZINESS [facility]

## 2017-12-07 ENCOUNTER — Other Ambulatory Visit: Payer: Self-pay | Admitting: Internal Medicine

## 2017-12-10 ENCOUNTER — Telehealth: Payer: Self-pay | Admitting: Internal Medicine

## 2017-12-10 NOTE — Telephone Encounter (Signed)
Tried calling pt to give her response below. No answer and VM was not set up. Will try back later.

## 2017-12-10 NOTE — Telephone Encounter (Signed)
BP looks good on average - continue to monitor at home - does not need to be on a daily basis.  Continue current medications

## 2017-12-10 NOTE — Telephone Encounter (Signed)
Copied from [place] [id number]. Topic: General - Other >> Dec 10, 2017  9:56 AM Reyne Dumas L wrote: Reason for CRM:   Pt calling to report blood pressure readings from 11/08 to 11/22 8th - 132/73 9th - 140/86 10th - 127/80 11th - 139/86 12th - 124/79 13th - 142/84 14th 136/81 15th - 135/80 16th - 116/74 17th - 138/84 18th - 109/70 20th - 130/82 21st - 147/86 22nd at 8:30am - 147/97, pulse of 73

## 2017-12-14 DIAGNOSIS — L84 Corns and callosities: Secondary | ICD-10-CM | POA: Diagnosis not present

## 2017-12-14 DIAGNOSIS — B351 Tinea unguium: Secondary | ICD-10-CM | POA: Diagnosis not present

## 2017-12-14 DIAGNOSIS — E1142 Type 2 diabetes mellitus with diabetic polyneuropathy: Secondary | ICD-10-CM | POA: Diagnosis not present

## 2017-12-14 DIAGNOSIS — M79673 Pain in unspecified foot: Secondary | ICD-10-CM | POA: Diagnosis not present

## 2017-12-14 NOTE — Telephone Encounter (Signed)
Pt aware of response below and expressed understanding.  

## 2017-12-28 ENCOUNTER — Telehealth: Payer: Self-pay | Admitting: Rheumatology

## 2017-12-28 MED ORDER — HYDROXYCHLOROQUINE SULFATE 200 MG PO TABS: 200.0000 mg | ORAL_TABLET | Freq: Two times a day (BID) | ORAL | 0 refills | Status: DC

## 2017-12-28 NOTE — Telephone Encounter (Signed)
Patient needs a refill on Plaquenil sent to [facility]. Patient also states her legs feel real heavy sometimes, and stiff. Patient wants to know what she can do to help this. Please call to discuss.

## 2017-12-28 NOTE — Telephone Encounter (Signed)
Last Visit: 09/01/17 Next visit: 02/02/18 Labs: 11/26/17 WNL PLQ EYe Exam: 12/25/16 WNL  Okay to refill per Dr. Estanislado Pandy

## 2017-12-28 NOTE — Telephone Encounter (Signed)
Attempted to contact the patient and left message for patient to call the office.  

## 2018-01-06 DIAGNOSIS — H40013 Open angle with borderline findings, low risk, bilateral: Secondary | ICD-10-CM | POA: Diagnosis not present

## 2018-01-06 DIAGNOSIS — E119 Type 2 diabetes mellitus without complications: Secondary | ICD-10-CM | POA: Diagnosis not present

## 2018-01-06 LAB — HM DIABETES EYE EXAM

## 2018-01-20 ENCOUNTER — Other Ambulatory Visit (HOSPITAL_COMMUNITY): Payer: Self-pay | Admitting: Internal Medicine

## 2018-01-20 DIAGNOSIS — Z1231 Encounter for screening mammogram for malignant neoplasm of breast: Secondary | ICD-10-CM

## 2018-01-20 NOTE — Progress Notes (Deleted)
Office Visit Note  Patient: Angela Simmons             Date of Birth: 1946/01/07           MRN: [id number]             PCP: Binnie Rail, MD Referring: Binnie Rail, MD Visit Date: 02/02/2018 Occupation: @GUAROCC @  Subjective:  No chief complaint on file.   History of Present Illness: Angela Simmons is a 73 y.o. female ***   Activities of Daily Living:  Patient reports morning stiffness for *** {minute/hour:19697}.   Patient {ACTIONS;DENIES/REPORTS:21021675::"Denies"} nocturnal pain.  Difficulty dressing/grooming: {ACTIONS;DENIES/REPORTS:21021675::"Denies"} Difficulty climbing stairs: {ACTIONS;DENIES/REPORTS:21021675::"Denies"} Difficulty getting out of chair: {ACTIONS;DENIES/REPORTS:21021675::"Denies"} Difficulty using hands for taps, buttons, cutlery, and/or writing: {ACTIONS;DENIES/REPORTS:21021675::"Denies"}  No Rheumatology ROS completed.   PMFS History:  Patient Active Problem List   Diagnosis Date Noted  . Tingling in extremities 11/26/2017  . Ischial bursitis of left side 06/02/2017  . Rash and nonspecific skin eruption 04/23/2017  . Sicca syndrome ([place]) 06/23/2016  . Bilateral leg edema 04/07/2016  . Burning sensation of skin 04/07/2016  . PMR (polymyalgia rheumatica) (HCC) 03/27/2016  . Osteopenia 03/04/2016  . Autoimmune disease ([place]) 03/18/2015  . Inflammatory arthritis 03/18/2015  . Joint pain 11/22/2014  . Overweight (BMI 25.0-29.9) 08/18/2014  . Low back pain with radiation 08/18/2014  . Hyperlipidemia 06/28/2014  . Chest pain, atypical 04/11/2014  . Tendonitis of shoulder 08/29/2013  . S/P right mastectomy 11/14/2012  . Type 2 diabetes, controlled, with neuropathy ([place]) 10/05/2012  . Allergic rhinitis 01/26/2011  . GERD 11/10/2009  . INTERSTITIAL CYSTITIS 10/31/2008  . BENIGN NEOPLASM OF VULVA 02/01/2008  . Essential hypertension, benign 02/08/2007  . History of cancer of right breast 02/08/2007    Past Medical History:  Diagnosis Date  .  Breast cancer ([place]) 1990s   right breast  . Diabetes mellitus without complication ([place])   . GERD (gastroesophageal reflux disease)   . HTN (hypertension)   . Hypercholesterolemia   . Obesity     Family History  Problem Relation Age of Onset  . Diabetes Mother        diabetic coma  . Heart failure Mother   . Diabetes Father   . Emphysema Father   . Diabetes Sister   . Hypercholesterolemia Sister   . Hypercholesterolemia Brother   . Diabetes Brother   . Hypertension Brother   . Hypercholesterolemia Brother   . Diabetes Sister   . Hypertension Sister   . Hypercholesterolemia Sister   . Hypercholesterolemia Sister   . Hypercholesterolemia Sister   . Diabetes Brother   . Hypertension Brother   . Colon cancer Neg Hx    Past Surgical History:  Procedure Laterality Date  . ABDOMINAL [place]CTOMY  [place]   [place]  . COLONOSCOPY  May 2003   Dr. Tamala Julian: normal  . COLONOSCOPY  06/22/2011   Procedure: COLONOSCOPY;  Surgeon: Danie Binder, MD;  [facility];  Service: Endoscopy;  Laterality: N/A;  10:30  . MASTECTOMY  1995   right    Social History   Social History Narrative   Lives at home with husband   Caffeine, maybe 1 a day    Objective: Vital Signs: There were no vitals taken for this visit.   Physical Exam   Musculoskeletal Exam: ***  CDAI Exam: CDAI Score: Not documented Patient Global Assessment: Not documented; Provider Global Assessment: Not documented Swollen: Not documented; Tender: Not documented Joint Exam  Not documented   There is currently no information documented on the homunculus. Go to the Rheumatology activity and complete the homunculus joint exam.  Investigation: No additional findings.  Imaging: No results found.  Recent Labs: Lab Results  Component Value Date   WBC 4.4 11/26/2017   HGB 12.3 11/26/2017   PLT 235.0 11/26/2017   NA 141 11/26/2017   K 3.8 11/26/2017   CL 104 11/26/2017   CO2 31 11/26/2017    GLUCOSE 80 11/26/2017   BUN 11 11/26/2017   CREATININE 0.92 11/26/2017   BILITOT 0.4 11/26/2017   ALKPHOS 59 11/26/2017   AST 18 11/26/2017   ALT 17 11/26/2017   PROT 7.7 11/26/2017   ALBUMIN 4.1 11/26/2017   CALCIUM 9.4 11/26/2017   GFRAA 71 06/02/2017    Speciality Comments: PLQ EYe Exam: 01/06/18 WNL @ [facility] OD [place] up in 1 year  Procedures:  No procedures performed Allergies: Levofloxacin; Nitrofurantoin; Penicillins; Sulfonamide derivatives; and Pantoprazole   Assessment / Plan:     Visit Diagnoses: No diagnosis found.   Orders: No orders of the defined types were placed in this encounter.  No orders of the defined types were placed in this encounter.   Face-to-face time spent with patient was *** minutes. Greater than 50% of time was spent in counseling and coordination of care.  Follow-Up Instructions: No follow-ups on file.   Earnestine Mealing, CMA  Note - This record has been created using Editor, commissioning.  Chart creation errors have been sought, but may not always  have been located. Such creation errors do not reflect on  the standard of medical care.

## 2018-01-26 ENCOUNTER — Ambulatory Visit (INDEPENDENT_AMBULATORY_CARE_PROVIDER_SITE_OTHER): Payer: PPO

## 2018-01-26 ENCOUNTER — Ambulatory Visit: Payer: PPO | Admitting: Physician Assistant

## 2018-01-26 ENCOUNTER — Encounter: Payer: Self-pay | Admitting: Physician Assistant

## 2018-01-26 VITALS — BP 134/86 | HR 86 | Resp 14 | Ht 69.5 in | Wt 204.8 lb

## 2018-01-26 DIAGNOSIS — M8589 Other specified disorders of bone density and structure, multiple sites: Secondary | ICD-10-CM | POA: Diagnosis not present

## 2018-01-26 DIAGNOSIS — M542 Cervicalgia: Secondary | ICD-10-CM | POA: Diagnosis not present

## 2018-01-26 DIAGNOSIS — Z8639 Personal history of other endocrine, nutritional and metabolic disease: Secondary | ICD-10-CM

## 2018-01-26 DIAGNOSIS — G8929 Other chronic pain: Secondary | ICD-10-CM

## 2018-01-26 DIAGNOSIS — R5383 Other fatigue: Secondary | ICD-10-CM

## 2018-01-26 DIAGNOSIS — Z79899 Other long term (current) drug therapy: Secondary | ICD-10-CM | POA: Diagnosis not present

## 2018-01-26 DIAGNOSIS — M353 Polymyalgia rheumatica: Secondary | ICD-10-CM

## 2018-01-26 DIAGNOSIS — M25561 Pain in right knee: Secondary | ICD-10-CM

## 2018-01-26 DIAGNOSIS — Z8669 Personal history of other diseases of the nervous system and sense organs: Secondary | ICD-10-CM | POA: Diagnosis not present

## 2018-01-26 DIAGNOSIS — M3500 Sicca syndrome, unspecified: Secondary | ICD-10-CM | POA: Diagnosis not present

## 2018-01-26 DIAGNOSIS — M7072 Other bursitis of hip, left hip: Secondary | ICD-10-CM | POA: Diagnosis not present

## 2018-01-26 DIAGNOSIS — Z8719 Personal history of other diseases of the digestive system: Secondary | ICD-10-CM

## 2018-01-26 DIAGNOSIS — M7062 Trochanteric bursitis, left hip: Secondary | ICD-10-CM

## 2018-01-26 DIAGNOSIS — M7061 Trochanteric bursitis, right hip: Secondary | ICD-10-CM | POA: Diagnosis not present

## 2018-01-26 DIAGNOSIS — M359 Systemic involvement of connective tissue, unspecified: Secondary | ICD-10-CM | POA: Diagnosis not present

## 2018-01-26 MED ORDER — LIDOCAINE HCL 1 % IJ SOLN
1.5000 mL | INTRAMUSCULAR | Status: AC | PRN
  Administered 2018-01-26: 1.5 mL

## 2018-01-26 MED ORDER — TRIAMCINOLONE ACETONIDE 40 MG/ML IJ SUSP
40.0000 mg | INTRAMUSCULAR | Status: AC | PRN
  Administered 2018-01-26: 40 mg via INTRA_ARTICULAR

## 2018-01-26 NOTE — Progress Notes (Signed)
Office Visit Note  Patient: Angela Simmons             Date of Birth: 03/04/1945           MRN: [id number]             PCP: Binnie Rail, MD Referring: Binnie Rail, MD Visit Date: 01/26/2018 Occupation: @GUAROCC @  Subjective:  Neck pain   History of Present Illness: Angela Simmons is a 73 y.o. female with history of PMR and autoimmune disease.  She is taking PLQ 200 mg BID.  She is not taking prednisone.  She states she has been having right knee joint pain that is radiating down her right leg for the past several months.  She denies any recent injuries or falls.  She states at times her right lower extremity feels heavy and stiff at times.  She denies any calf tenderness or swelling.  She denies any ankle joint pain.  She denies any joint swelling.  She has been having increased neck pain and stiffness. She denies any symptoms or radiculopathy.  She denies any rashes, hair loss, or photosensitivity.  She denies any symptoms of Raynaud's.  She denies any swollen nodes. She continues to have sicca symptoms.  She denies sores in her mouth or nose. She continues to have fatigue.   Activities of Daily Living:  Patient reports morning stiffness for15-20  minutes.   Patient Denies nocturnal pain.  Difficulty dressing/grooming: Denies Difficulty climbing stairs: Reports Difficulty getting out of chair: Reports Difficulty using hands for taps, buttons, cutlery, and/or writing: Reports  Review of Systems  Constitutional: Positive for fatigue.  HENT: Positive for mouth dryness. Negative for mouth sores and nose dryness.   Eyes: Positive for dryness. Negative for pain and visual disturbance.  Respiratory: Negative for cough, hemoptysis, shortness of breath and difficulty breathing.   Cardiovascular: Negative for chest pain, palpitations, hypertension and swelling in legs/feet.  Gastrointestinal: Negative for blood in stool, constipation and diarrhea.  Endocrine: Negative for increased  urination.  Genitourinary: Negative for painful urination.  Musculoskeletal: Positive for morning stiffness. Negative for arthralgias, joint pain, joint swelling, myalgias, muscle weakness, muscle tenderness and myalgias.  Skin: Negative for color change, pallor, rash, hair loss, nodules/bumps, skin tightness, ulcers and sensitivity to sunlight.  Allergic/Immunologic: Negative for susceptible to infections.  Neurological: Negative for dizziness, numbness, headaches and weakness.  Hematological: Negative for swollen glands.  Psychiatric/Behavioral: Negative for depressed mood and sleep disturbance. The patient is not nervous/anxious.     PMFS History:  Patient Active Problem List   Diagnosis Date Noted  . Tingling in extremities 11/26/2017  . Ischial bursitis of left side 06/02/2017  . Rash and nonspecific skin eruption 04/23/2017  . Sicca syndrome ([place]) 06/23/2016  . Bilateral leg edema 04/07/2016  . Burning sensation of skin 04/07/2016  . PMR (polymyalgia rheumatica) ([facility]) 03/27/2016  . Osteopenia 03/04/2016  . Autoimmune disease ([place]) 03/18/2015  . Inflammatory arthritis 03/18/2015  . Joint pain 11/22/2014  . Overweight (BMI 25.0-29.9) 08/18/2014  . Low back pain with radiation 08/18/2014  . Hyperlipidemia 06/28/2014  . Chest pain, atypical 04/11/2014  . Tendonitis of shoulder 08/29/2013  . S/P right mastectomy 11/14/2012  . Type 2 diabetes, controlled, with neuropathy ([place]) 10/05/2012  . Allergic rhinitis 01/26/2011  . GERD 11/10/2009  . INTERSTITIAL CYSTITIS 10/31/2008  . BENIGN NEOPLASM OF VULVA 02/01/2008  . Essential hypertension, benign 02/08/2007  . History of cancer of right breast 02/08/2007    Past Medical  History:  Diagnosis Date  . Breast cancer ([place]) 1990s   right breast  . Diabetes mellitus without complication ([place])   . GERD (gastroesophageal reflux disease)   . HTN (hypertension)   . Hypercholesterolemia   . Obesity     Family History  Problem  Relation Age of Onset  . Diabetes Mother        diabetic coma  . Heart failure Mother   . Diabetes Father   . Emphysema Father   . Diabetes Sister   . Hypercholesterolemia Sister   . Hypercholesterolemia Brother   . Diabetes Brother   . Hypertension Brother   . Hypercholesterolemia Brother   . Diabetes Sister   . Hypertension Sister   . Hypercholesterolemia Sister   . Hypercholesterolemia Sister   . Hypercholesterolemia Sister   . Diabetes Brother   . Hypertension Brother   . Colon cancer Neg Hx    Past Surgical History:  Procedure Laterality Date  . ABDOM[place]AL [place]CTOMY  [place]  . COLONOSCOPY  May 2003   Dr. Tamala Julian: normal  . COLONOSCOPY  06/22/2011   Procedure: COLONOSCOPY;  Surgeon: Danie Binder, MD;  Location: [facility];  Service: Endoscopy;  Laterality: N/A;  10:30  . MASTECTOMY  1995   right    Social History   Social History Narrative   Lives at home with husband   Caffeine, maybe 1 a day    Objective: Vital Signs: BP 134/86 (BP Location: Left Arm, Patient Position: Sitting, Cuff Size: Large)   Pulse 86   Resp 14   Ht 5' 9.5" (1.765 m)   Wt 204 lb 12.8 oz (92.9 kg)   BMI 29.81 kg/m    Physical Exam Vitals signs and nursing note reviewed.  Constitutional:      Appearance: She is well-developed.  HENT:     Head: Normocephalic and atraumatic.  Eyes:     Conjunctiva/sclera: Conjunctivae normal.  Neck:     Musculoskeletal: Normal range of motion.  Cardiovascular:     Rate and Rhythm: Normal rate and regular rhythm.     Heart sounds: Normal heart sounds.  Pulmonary:     Effort: Pulmonary effort is normal.     Breath sounds: Normal breath sounds.  Abdominal:     General: Bowel sounds are normal.     Palpations: Abdomen is soft.  Lymphadenopathy:     Cervical: No cervical adenopathy.  Skin:    General: Skin is warm and dry.     Capillary Refill: Capillary refill takes less than 2 seconds.  Neurological:     Mental Status: She  is alert and oriented to person, place, and time.  Psychiatric:        Behavior: Behavior normal.      Musculoskeletal Exam: C-spine limited ROM with discomfort.  Thoracic and lumbar spine good ROM.  No midline spinal tenderness.  No SI joint tenderness.  Shoulder joints, elbow joints, wrist joints, MCPs, PIPs, and DIPs good ROM with no synovitis.  Complete fist formation bilaterally. Slightly limited ROM of both hip joints.  Right knee full extension and slightly limited flexion with discomfort. Left knee good ROM with no discomfort.  No warmth or effusion noted.  No calf tenderness or tightness noted.  No tenderness or swelling of ankle joints.  Mild tenderness over trochanteric bursa bilaterally.   CDAI Exam: CDAI Score: Not documented Patient Global Assessment: Not documented; Provider Global Assessment: Not documented Swollen: Not documented; Tender: Not documented Joint Exam  Not documented   There is currently no information documented on the homunculus. Go to the Rheumatology activity and complete the homunculus joint exam.  Investigation: No additional findings.  Imaging: Xr Cervical Spine 2 Or 3 Views  Result Date: 01/26/2018 No significant disc space narrowing was noted.  Mild facet joint arthropathy was noted.  Xr Knee 3 View Right  Result Date: 01/26/2018 Moderate to severe lateral compartment narrowing was noted.  Intercondylar osteophytes and lateral osteophytes were noted.  No chondrocalcinosis was noted.  Moderate patellofemoral narrowing was noted. Impression: These findings are consistent with moderate to severe osteoarthritis of the knee joint and moderate chondromalacia patella.   Recent Labs: Lab Results  Component Value Date   WBC 4.4 11/26/2017   HGB 12.3 11/26/2017   PLT 235.0 11/26/2017   NA 141 11/26/2017   K 3.8 11/26/2017   CL 104 11/26/2017   CO2 31 11/26/2017   GLUCOSE 80 11/26/2017   BUN 11 11/26/2017   CREATININE 0.92 11/26/2017   BILITOT  0.4 11/26/2017   ALKPHOS 59 11/26/2017   AST 18 11/26/2017   ALT 17 11/26/2017   PROT 7.7 11/26/2017   ALBUMIN 4.1 11/26/2017   CALCIUM 9.4 11/26/2017   GFRAA 71 06/02/2017    Speciality Comments: PLQ EYe Exam: 01/06/18 WNL @ [facility] [place] up in 1 year  Procedures:  Large Joint Inj: R knee on 01/26/2018 9:03 AM Indications: pain Details: 27 G 1.5 in needle, medial approach  Arthrogram: No  Medications: 1.5 mL lidocaine 1 %; 40 mg triamcinolone acetonide 40 MG/ML Aspirate: 0 mL Outcome: tolerated well, no immediate complications Procedure, treatment alternatives, risks and benefits explained, specific risks discussed. Consent was given by the patient. Immediately prior to procedure a time out was called to verify the correct patient, procedure, equipment, support staff and site/side marked as required. Patient was prepped and draped in the usual sterile fashion.     Allergies: Levofloxacin; Nitrofurantoin; Penicillins; Sulfonamide derivatives; and Pantoprazole   Assessment / Plan:     Visit Diagnoses: PMR (polymyalgia rheumatica) ([place]):  She has not had any recent signs or symptoms of a flare recently.  She is no longer taking prednisone.  Most recent sed rate was 43 on 07/20/17. She has not difficulty getting up from a chair or raising her arms above her head.  She has full strength of upper and lower extremities.  She has no temporal artery tenderness bilaterally.  She was advised to notify us if she develops any signs or symptoms of a PMR flare.    Autoimmune disease ([place]) -  ANA+, sicca symptoms: She has not had any recent flares. She continues to have sicca symptoms but she has no parotid swelling on exam.  She has no oral or nasal ulcerations on exam.  She has no symptoms of Raynaud's or digital ulcerations.  She has no malar rash, and she has not been experiencing photosensitivity or hair loss.  She has no synovitis on exam.  She is clinically doing well on Plaquenil 200 mg  BID.    Sicca syndrome [facility]): She continues to have sicca symptoms.  She has no parotid swelling on exam.   High risk medication use -  PLQ 200 mg 1 tablet BID. CBC and CMP are WNL on 11/26/17.   Trochanteric bursitis of both hips: She has mild tenderness over bilateral trochanteric bursa.   Ischial bursitis of left side: She has no tenderness at this time.   Osteopenia of multiple sites: She  takes a calcium and vitamin D supplement on a daily basis.   Neck pain -She has been having increased neck pain and stiffness recently.  She has no symptoms of radiculopathy at this time.  She has limited ROM with lateral rotation.  A x-ray of the C-spine was obtained today, which revealed mild facet joint arthropathy. She was given a handout of neck exercises that she can perform at home.  She was advised to notify us if she develops worsening symptoms.  Plan: XR Cervical Spine 2 or 3 views  Chronic pain of right knee -She has been having increased pain in the right knee joint for the past several months.  She has been experiencing radiating pain down the right leg.  She has not had any recent falls or injuries.   She has no warmth or effusion on exam.  She has no tenderness along the joint line.  She has no obvious instability.  She has no right calf tenderness or tightness. No erythema noted.  She has full extension and slightly limited flexion with discomfort.  No obvious baker's cyst is palpable.  A x-ray of the right knee joint was obtained today. X-rays revealed moderate to severe osteoarthritis and moderate chondromalacia patella. She was given a handout of knee exercises that she can perform at home. We discussed visco gel injections in the future but she does not want to proceed with applying through her insurance at this time.  She requested a right knee joint cortisone injection today.  She tolerated the procedure well.  Procedure note completed above.  She was encouraged to continue to use voltaren gel  topically. Plan: XR KNEE 3 VIEW RIGHT   Other medical conditions are listed as follows:   History of peripheral neuropathy  Other fatigue  History of gastroesophageal reflux (GERD)  History of hyperlipidemia    Orders: Orders Placed This Encounter  Procedures  . Large Joint Inj  . XR Cervical Spine 2 or 3 views  . XR KNEE 3 VIEW RIGHT   No orders of the defined types were placed in this encounter.   Face-to-face time spent with patient was 30 minutes. Greater than 50% of time was spent in counseling and coordination of care.  Follow-Up Instructions: Return in about 5 months (around 06/27/2018) for Polymyalgia Rheumatica, Autoimmune Disease.   Ofilia Neas, PA-C  Note - This record has been created using Dragon software.  Chart creation errors have been sought, but may not always  have been located. Such creation errors do not reflect on  the standard of medical care.

## 2018-01-26 NOTE — Patient Instructions (Addendum)
Standing Labs We placed an order today for your standing lab work.    Please come back and get your standing labs in April and every 5 months   We have open lab Monday through Friday from 8:30-11:30 AM and 1:30-4:00 PM  at the office of Dr. Bo Merino.   You may experience shorter wait times on Monday and Friday afternoons. The office is located at [address] [place], [place], [place] [address] No appointment is necessary.   Labs are drawn by [facility].  You may receive a bill from [facility] for your lab work.  If you wish to have your labs drawn at another location, please call the office 24 hours in advance to send orders.  If you have any questions regarding directions or hours of operation,  please call [phone].   Just as a reminder please drink plenty of water prior to coming for your lab work. Thanks!    Knee Exercises              Ask your health care provider which exercises are safe for you. Do exercises exactly as told by your health care provider and adjust them as directed. It is normal to feel mild stretching, pulling, tightness, or discomfort as you do these exercises, but you should stop right away if you feel sudden pain or your pain gets worse.Do not begin these exercises until told by your health care provider. STRETCHING AND RANGE OF MOTION EXERCISES These exercises warm up your muscles and joints and improve the movement and flexibility of your knee. These exercises also help to relieve pain, numbness, and tingling. Exercise A: Knee Extension, Prone 1. Lie on your abdomen on a bed. 2. Place your left / right knee just beyond the edge of the surface so your knee is not on the bed. You can put a towel under your left / right thigh just above your knee for comfort. 3. Relax your leg muscles and allow gravity to straighten your knee. You should feel a stretch behind your left / right knee. 4. Hold this position for __________ seconds. 5. Scoot up so  your knee is supported between repetitions. Repeat __________ times. Complete this stretch __________ times a day. Exercise B: Knee Flexion, Active 1. Lie on your back with both knees straight. If this causes back discomfort, bend your left / right knee so your foot is flat on the floor. 2. Slowly slide your left / right heel back toward your buttocks until you feel a gentle stretch in the front of your knee or thigh. 3. Hold this position for __________ seconds. 4. Slowly slide your left / right heel back to the starting position. Repeat __________ times. Complete this exercise __________ times a day. Exercise C: Quadriceps, Prone 1. Lie on your abdomen on a firm surface, such as a bed or padded floor. 2. Bend your left / right knee and hold your ankle. If you cannot reach your ankle or pant leg, loop a belt around your foot and grab the belt instead. 3. Gently pull your heel toward your buttocks. Your knee should not slide out to the side. You should feel a stretch in the front of your thigh and knee. 4. Hold this position for __________ seconds. Repeat __________ times. Complete this stretch __________ times a day. Exercise D: Hamstring, Supine 1. Lie on your back. 2. Loop a belt or towel over the ball of your left / right foot. The ball of your foot is on  the walking surface, right under your toes. 3. Straighten your left / right knee and slowly pull on the belt to raise your leg until you feel a gentle stretch behind your knee. ? Do not let your left / right knee bend while you do this. ? Keep your other leg flat on the floor. 4. Hold this position for __________ seconds. Repeat __________ times. Complete this stretch __________ times a day. STRENGTHENING EXERCISES These exercises build strength and endurance in your knee. Endurance is the ability to use your muscles for a long time, even after they get tired. Exercise E: Quadriceps, Isometric 1. Lie on your back with your left / right  leg extended and your other knee bent. Put a rolled towel or small pillow under your knee if told by your health care provider. 2. Slowly tense the muscles in the front of your left / right thigh. You should see your kneecap slide up toward your hip or see increased dimpling just above the knee. This motion will push the back of the knee toward the floor. 3. For __________ seconds, keep the muscle as tight as you can without increasing your pain. 4. Relax the muscles slowly and completely. Repeat __________ times. Complete this exercise __________ times a day. Exercise F: Straight Leg Raises - Quadriceps 1. Lie on your back with your left / right leg extended and your other knee bent. 2. Tense the muscles in the front of your left / right thigh. You should see your kneecap slide up or see increased dimpling just above the knee. Your thigh may even shake a bit. 3. Keep these muscles tight as you raise your leg 4-6 inches (10-15 cm) off the floor. Do not let your knee bend. 4. Hold this position for __________ seconds. 5. Keep these muscles tense as you lower your leg. 6. Relax your muscles slowly and completely after each repetition. Repeat __________ times. Complete this exercise __________ times a day. Exercise G: Hamstring, Isometric 1. Lie on your back on a firm surface. 2. Bend your left / right knee approximately __________ degrees. 3. Dig your left / right heel into the surface as if you are trying to pull it toward your buttocks. Tighten the muscles in the back of your thighs to dig as hard as you can without increasing any pain. 4. Hold this position for __________ seconds. 5. Release the tension gradually and allow your muscles to relax completely for __________ seconds after each repetition. Repeat __________ times. Complete this exercise __________ times a day. Exercise H: Hamstring Curls If told by your health care provider, do this exercise while wearing ankle weights. Begin with  __________ weights. Then increase the weight by 1 lb (0.5 kg) increments. Do not wear ankle weights that are more than __________. 1. Lie on your abdomen with your legs straight. 2. Bend your left / right knee as far as you can without feeling pain. Keep your hips flat against the floor. 3. Hold this position for __________ seconds. 4. Slowly lower your leg to the starting position. Repeat __________ times. Complete this exercise __________ times a day. Exercise I: Squats (Quadriceps) 1. Stand in front of a table, with your feet and knees pointing straight ahead. You may rest your hands on the table for balance but not for support. 2. Slowly bend your knees and lower your hips like you are going to sit in a chair. ? Keep your weight over your heels, not over your toes. ? Keep your  lower legs upright so they are parallel with the table legs. ? Do not let your hips go lower than your knees. ? Do not bend lower than told by your health care provider. ? If your knee pain increases, do not bend as low. 3. Hold the squat position for __________ seconds. 4. Slowly push with your legs to return to standing. Do not use your hands to pull yourself to standing. Repeat __________ times. Complete this exercise __________ times a day. Exercise J: Wall Slides (Quadriceps) 1. Lean your back against a smooth wall or door while you walk your feet out 18-24 inches (46-61 cm) from it. 2. Place your feet hip-width apart. 3. Slowly slide down the wall or door until your knees bend __________ degrees. Keep your knees over your heels, not over your toes. Keep your knees in line with your hips. 4. Hold for __________ seconds. Repeat __________ times. Complete this exercise __________ times a day. Exercise K: Straight Leg Raises - Hip Abductors 1. Lie on your side with your left / right leg in the top position. Lie so your head, shoulder, knee, and hip line up. You may bend your bottom knee to help you keep your  balance. 2. Roll your hips slightly forward so your hips are stacked directly over each other and your left / right knee is facing forward. 3. Leading with your heel, lift your top leg 4-6 inches (10-15 cm). You should feel the muscles in your outer hip lifting. ? Do not let your foot drift forward. ? Do not let your knee roll toward the ceiling. 4. Hold this position for __________ seconds. 5. Slowly return your leg to the starting position. 6. Let your muscles relax completely after each repetition. Repeat __________ times. Complete this exercise __________ times a day. Exercise L: Straight Leg Raises - Hip Extensors 1. Lie on your abdomen on a firm surface. You can put a pillow under your hips if that is more comfortable. 2. Tense the muscles in your buttocks and lift your left / right leg about 4-6 inches (10-15 cm). Keep your knee straight as you lift your leg. 3. Hold this position for __________ seconds. 4. Slowly lower your leg to the starting position. 5. Let your leg relax completely after each repetition. Repeat __________ times. Complete this exercise __________ times a day. This information is not intended to replace advice given to you by your health care provider. Make sure you discuss any questions you have with your health care provider. Document Released: 11/19/2004 Document Revised: 09/30/2015 Document Reviewed: 11/11/2014 Elsevier Interactive Patient Education  2019 [employer] Neck Exercises Neck exercises can be important for many reasons:  They can help you to improve and maintain flexibility in your neck. This can be especially important as you age.  They can help to make your neck stronger. This can make movement easier.  They can reduce or prevent neck pain.  They may help your upper back. Ask your health care provider which neck exercises would be best for you. Exercises to improve neck flexibility Neck stretch Repeat this exercise 3-5 times. 1. Do this  exercise while standing or while sitting in a chair. 2. Place your feet flat on the floor, shoulder-width apart. 3. Slowly turn your head to the right. Turn it all the way to the right so you can look over your right shoulder. Do not tilt or tip your head. 4. Hold this position for 10-30 seconds. 5. Slowly turn your head to  the left, to look over your left shoulder. 6. Hold this position for 10-30 seconds.  Neck retraction Repeat this exercise 8-10 times. Do this 3-4 times a day or as told by your health care provider. 1. Do this exercise while standing or while sitting in a sturdy chair. 2. Look straight ahead. Do not bend your neck. 3. Use your fingers to push your chin backward. Do not bend your neck for this movement. Continue to face straight ahead. If you are doing the exercise properly, you will feel a slight sensation in your throat and a stretch at the back of your neck. 4. Hold the stretch for 1-2 seconds. Relax and repeat. Exercises to improve neck strength Neck press Repeat this exercise 10 times. Do it first thing in the morning and right before bed or as told by your health care provider. 1. Lie on your back on a firm bed or on the floor with a pillow under your head. 2. Use your neck muscles to push your head down on the pillow and straighten your spine. 3. Hold the position as well as you can. Keep your head facing up and your chin tucked. 4. Slowly count to 5 while holding this position. 5. Relax for a few seconds. Then repeat. Isometric strengthening Do a full set of these exercises 2 times a day or as told by your health care provider. 1. Sit in a supportive chair and place your hand on your forehead. 2. Push forward with your head and neck while pushing back with your hand. Hold for 10 seconds. 3. Relax. Then repeat the exercise 3 times. 4. Next, do thesequence again, this time putting your hand against the back of your head. Use your head and neck to push backward  against the hand pressure. 5. Finally, do the same exercise on either side of your head, pushing sideways against the pressure of your hand. Prone head lifts Repeat this exercise 5 times. Do this 2 times a day or as told by your health care provider. 1. Lie face-down, resting on your elbows so that your chest and upper back are raised. 2. Start with your head facing downward, near your chest. Position your chin either on or near your chest. 3. Slowly lift your head upward. Lift until you are looking straight ahead. Then continue lifting your head as far back as you can stretch. 4. Hold your head up for 5 seconds. Then slowly lower it to your starting position. Supine head lifts Repeat this exercise 8-10 times. Do this 2 times a day or as told by your health care provider. 1. Lie on your back, bending your knees to point to the ceiling and keeping your feet flat on the floor. 2. Lift your head slowly off the floor, raising your chin toward your chest. 3. Hold for 5 seconds. 4. Relax and repeat. Scapular retraction Repeat this exercise 5 times. Do this 2 times a day or as told by your health care provider. 1. Stand with your arms at your sides. Look straight ahead. 2. Slowly pull both shoulders backward and downward until you feel a stretch between your shoulder blades in your upper back. 3. Hold for 10-30 seconds. 4. Relax and repeat. Contact a health care provider if:  Your neck pain or discomfort gets much worse when you do an exercise.  Your neck pain or discomfort does not improve within 2 hours after you exercise. If you have any of these problems, stop exercising right away. Do  not do the exercises again unless your health care provider says that you can. Get help right away if:  You develop sudden, severe neck pain. If this happens, stop exercising right away. Do not do the exercises again unless your health care provider says that you can. This information is not intended to  replace advice given to you by your health care provider. Make sure you discuss any questions you have with your health care provider. Document Released: 12/17/2014 Document Revised: 05/11/2017 Document Reviewed: 07/16/2014 Elsevier Interactive Patient Education  2019 [employer].

## 2018-02-02 ENCOUNTER — Ambulatory Visit: Payer: PPO | Admitting: Physician Assistant

## 2018-02-03 ENCOUNTER — Encounter: Payer: Self-pay | Admitting: Internal Medicine

## 2018-02-03 NOTE — Progress Notes (Signed)
Abstracted and sent to scan  

## 2018-02-24 ENCOUNTER — Ambulatory Visit (HOSPITAL_COMMUNITY): Payer: PPO

## 2018-02-28 ENCOUNTER — Other Ambulatory Visit: Payer: Self-pay | Admitting: Internal Medicine

## 2018-03-01 DIAGNOSIS — B351 Tinea unguium: Secondary | ICD-10-CM | POA: Diagnosis not present

## 2018-03-01 DIAGNOSIS — E1142 Type 2 diabetes mellitus with diabetic polyneuropathy: Secondary | ICD-10-CM | POA: Diagnosis not present

## 2018-03-01 DIAGNOSIS — L84 Corns and callosities: Secondary | ICD-10-CM | POA: Diagnosis not present

## 2018-03-01 DIAGNOSIS — M79673 Pain in unspecified foot: Secondary | ICD-10-CM | POA: Diagnosis not present

## 2018-03-02 ENCOUNTER — Encounter (HOSPITAL_COMMUNITY): Payer: Self-pay

## 2018-03-02 ENCOUNTER — Ambulatory Visit (HOSPITAL_COMMUNITY)
Admission: RE | Admit: 2018-03-02 | Discharge: 2018-03-02 | Disposition: A | Discharge status: Home / Self Care | Payer: PPO | Source: Ambulatory Visit | Attending: Internal Medicine | Admitting: Internal Medicine

## 2018-03-02 ENCOUNTER — Other Ambulatory Visit (HOSPITAL_COMMUNITY): Payer: Self-pay | Admitting: Internal Medicine

## 2018-03-02 DIAGNOSIS — Z1231 Encounter for screening mammogram for malignant neoplasm of breast: Secondary | ICD-10-CM | POA: Insufficient documentation

## 2018-03-03 ENCOUNTER — Other Ambulatory Visit: Payer: Self-pay | Admitting: Internal Medicine

## 2018-03-21 ENCOUNTER — Other Ambulatory Visit: Payer: Self-pay | Admitting: Internal Medicine

## 2018-03-23 ENCOUNTER — Encounter: Payer: Self-pay | Admitting: Internal Medicine

## 2018-03-23 ENCOUNTER — Ambulatory Visit (INDEPENDENT_AMBULATORY_CARE_PROVIDER_SITE_OTHER): Payer: PPO | Admitting: Internal Medicine

## 2018-03-23 VITALS — BP 152/82 | HR 80 | Temp 98.0°F | Resp 16 | Ht 69.5 in | Wt 201.4 lb

## 2018-03-23 DIAGNOSIS — B029 Zoster without complications: Secondary | ICD-10-CM

## 2018-03-23 MED ORDER — VALACYCLOVIR HCL 1 G PO TABS: 1000.0000 mg | ORAL_TABLET | Freq: Three times a day (TID) | ORAL | 0 refills | Status: AC

## 2018-03-23 NOTE — Assessment & Plan Note (Signed)
Rash c/w shingrix Occurred less than two months after second shingrix vaccine Will start valtrex TID x 1 week Symptoms mild - will hold off on gabapentin but she will call me if tylenol is not effective

## 2018-03-23 NOTE — Progress Notes (Signed)
Subjective:    Patient ID: Angela Simmons, female    DOB: 1945/08/19, 73 y.o.   MRN: [id number]  HPI The patient is here for an acute visit.   Rash:  Today is day six. She had symptoms start but the rash developed later.  It was itnitially very itchy and sensitive - she did not want anything to touch it.  She put otc cortisone on it - helped minimally, she also tried aloe vera.  It still itches on occasion and she has a burning like pain.  She has slight headaches across the forehead.       She denies fevers and cold symptoms.   She had the second shingrix injection 02/02/2018.  Medications and allergies reviewed with patient and updated if appropriate.  Patient Active Problem List   Diagnosis Date Noted  . Tingling in extremities 11/26/2017  . Ischial bursitis of left side 06/02/2017  . Rash and nonspecific skin eruption 04/23/2017  . Sicca syndrome ([place]) 06/23/2016  . Bilateral leg edema 04/07/2016  . Burning sensation of skin 04/07/2016  . PMR (polymyalgia rheumatica) ([facility]) 03/27/2016  . Osteopenia 03/04/2016  . Autoimmune disease ([place]) 03/18/2015  . Inflammatory arthritis 03/18/2015  . Joint pain 11/22/2014  . Overweight (BMI 25.0-29.9) 08/18/2014  . Low back pain with radiation 08/18/2014  . Hyperlipidemia 06/28/2014  . Chest pain, atypical 04/11/2014  . Tendonitis of shoulder 08/29/2013  . S/P right mastectomy 11/14/2012  . Type 2 diabetes, controlled, with neuropathy ([place]) 10/05/2012  . Allergic rhinitis 01/26/2011  . GERD 11/10/2009  . INTERSTITIAL CYSTITIS 10/31/2008  . BENIGN NEOPLASM OF VULVA 02/01/2008  . Essential hypertension, benign 02/08/2007  . History of cancer of right breast 02/08/2007    Current Outpatient Medications on File Prior to Visit  Medication Sig Dispense Refill  . amLODipine (NORVASC) 5 MG tablet TAKE ONE TABLET BY MOUTH EVERY DAY 90 tablet 1  . aspirin (ADULT ASPIRIN EC LOW STRENGTH) 81 MG EC tablet Take 81 mg by mouth daily.     Marland Kitchen  atorvastatin (LIPITOR) 10 MG tablet Take 1 tablet (10 mg total) by mouth daily. 90 tablet 3  . benazepril (LOTENSIN) 40 MG tablet TAKE 1 TABLET BY MOUTH EVERY DAY 90 tablet 1  . Blood Glucose Monitoring Suppl (ONETOUCH VERIO) w/Device KIT USE TO TEST BLOOD SUGAR ONCE DAILY AS DIRECTED 1 kit 0  . calcium-vitamin D (OSCAL WITH D) 500-200 MG-UNIT tablet Take 1 tablet by mouth daily with breakfast.    . diclofenac sodium (VOLTAREN) 1 % GEL Apply 3 g to 3 large joints up to 3 times daily. 3 Tube 3  . fluticasone (FLONASE) 50 MCG/ACT nasal spray USE 2 SPRAYS IN EACH NOSTRIL EVERY DAY 48 g 0  . glucose blood (FORA V30A BLOOD GLUCOSE TEST) test strip Once daily testing 100 each 0  . loratadine (CLARITIN) 10 MG tablet TAKE 1 TABLET BY MOUTH EVERY DAY (Patient taking differently: TAKE 1 TABLET BY MOUTH EVERY DAY PRN) 30 tablet 2  . Multiple Vitamin (MULTIVITAMIN) tablet Take 1 tablet by mouth daily.    . Omega-3 Fatty Acids (FISH OIL) 1000 MG CAPS Take 1 capsule by mouth daily.     Marland Kitchen omeprazole (PRILOSEC) 40 MG capsule Take 1 capsule (40 mg total) by mouth daily. 30 capsule 5  . ONETOUCH DELICA LANCETS 50V MISC USE TO TEST ONCE DAILY 100 each 2  . triamcinolone ointment (KENALOG) 0.5 % Apply 1 application topically 2 (two) times daily. Do not use  for more than 14 days in a row 15 g 0   No current facility-administered medications on file prior to visit.     Past Medical History:  Diagnosis Date  . Breast cancer ([place]) 1990s   right breast  . Diabetes mellitus without complication ([place])   . GERD (gastroesophageal reflux disease)   . HTN (hypertension)   . Hypercholesterolemia   . Obesity     Past Surgical History:  Procedure Laterality Date  . ABDOMINAL HYSTERECTOMY  1993   [place], [place]  . COLONOSCOPY  May 2003   Dr. Tamala Julian: normal  . COLONOSCOPY  06/22/2011   Procedure: COLONOSCOPY;  Surgeon: Danie Binder, MD;  Location: [facility];  Service: Endoscopy;  Laterality: N/A;  10:30  .  MASTECTOMY  1995   right     Social History   Socioeconomic History  . Marital status: Married    Spouse name: Not on file  . Number of children: 3  . Years of education: 7  . Highest education level: Not on file  Occupational History  . Not on file  Social Needs  . Financial resource strain: Not on file  . Food insecurity:    Worry: Not on file    Inability: Not on file  . Transportation needs:    Medical: Not on file    Non-medical: Not on file  Tobacco Use  . Smoking status: Never Smoker  . Smokeless tobacco: Never Used  Substance and Sexual Activity  . Alcohol use: No  . Drug use: Never  . Sexual activity: Never    Birth control/protection: Rhythm  Lifestyle  . Physical activity:    Days per week: Not on file    Minutes per session: Not on file  . Stress: Not on file  Relationships  . Social connections:    Talks on phone: Not on file    Gets together: Not on file    Attends religious service: Not on file    Active member of club or organization: Not on file    Attends meetings of clubs or organizations: Not on file    Relationship status: Not on file  Other Topics Concern  . Not on file  Social History Narrative   Lives at home with husband   Caffeine, maybe 1 a day    Family History  Problem Relation Age of Onset  . Diabetes Mother        diabetic coma  . Heart failure Mother   . Diabetes Father   . Emphysema Father   . Diabetes Sister   . Hypercholesterolemia Sister   . Hypercholesterolemia Brother   . Diabetes Brother   . Hypertension Brother   . Hypercholesterolemia Brother   . Diabetes Sister   . Hypertension Sister   . Hypercholesterolemia Sister   . Hypercholesterolemia Sister   . Hypercholesterolemia Sister   . Diabetes Brother   . Hypertension Brother   . Colon cancer Neg Hx     Review of Systems  Constitutional: Negative for fever.  Eyes: Negative for visual disturbance.  Skin: Positive for rash.  Neurological: Positive for  numbness and headaches. Negative for dizziness and light-headedness.       Objective:   Vitals:   03/23/18 1534  BP: (!) 152/82  Pulse: 80  Resp: 16  Temp: 98 F (36.7 C)  SpO2: 96%   BP Readings from Last 3 Encounters:  03/23/18 (!) 152/82  01/26/18 134/86  11/26/17 140/80   Wt Readings  from Last 3 Encounters:  03/23/18 201 lb 6.4 oz (91.4 kg)  01/26/18 204 lb 12.8 oz (92.9 kg)  11/26/17 203 lb 12.8 oz (92.4 kg)   Body mass index is 29.32 kg/m.   Physical Exam Constitutional:      Appearance: Normal appearance.  Skin:    General: Skin is warm and dry.     Findings: Rash (right superifical cervical plexus - clusters of erythematous papules/small blisters in clumps.  no rash on chest, shoulder, arm) present.  Neurological:     General: No focal deficit present.     Mental Status: She is alert.            Assessment & Plan:    See Problem List for Assessment and Plan of chronic medical problems.

## 2018-03-23 NOTE — Patient Instructions (Addendum)
Take valtrex ( anti-viral) for one week.    If your itching and pain increases we can try gabapentin, which is a nerve pain medication - just let me know.     Shingles  Shingles, which is also known as herpes zoster, is an infection that causes a painful skin rash and fluid-filled blisters. It is caused by a virus. Shingles only develops in people who:  Have had chickenpox.  Have been given a medicine to protect against chickenpox (have been vaccinated). Shingles is rare in this group. What are the causes? Shingles is caused by varicella-zoster virus (VZV). This is the same virus that causes chickenpox. After a person is exposed to VZV, the virus stays in the body in an inactive (dormant) state. Shingles develops if the virus is reactivated. This can happen many years after the first (initial) exposure to VZV. It is not known what causes this virus to be reactivated. What increases the risk? People who have had chickenpox or received the chickenpox vaccine are at risk for shingles. Shingles infection is more common in people who:  Are older than age 73.  Have a weakened disease-fighting system (immune system), such as people with: ? HIV. ? AIDS. ? Cancer.  Are taking medicines that weaken the immune system, such as transplant medicines.  Are experiencing a lot of stress. What are the signs or symptoms? Early symptoms of this condition include itching, tingling, and pain in an area on your skin. Pain may be described as burning, stabbing, or throbbing. A few days or weeks after early symptoms start, a painful red rash appears. The rash is usually on one side of the body and has a band-like or belt-like pattern. The rash eventually turns into fluid-filled blisters that break open, change into scabs, and dry up in about 2-3 weeks. At any time during the infection, you may also develop:  A fever.  Chills.  A headache.  An upset stomach. How is this diagnosed? This condition is  diagnosed with a skin exam. Skin or fluid samples may be taken from the blisters before a diagnosis is made. These samples are examined under a microscope or sent to a lab for testing. How is this treated? The rash may last for several weeks. There is not a specific cure for this condition. Your health care provider will probably prescribe medicines to help you manage pain, recover more quickly, and avoid long-term problems. Medicines may include:  Antiviral drugs.  Anti-inflammatory drugs.  Pain medicines.  Anti-itching medicines (antihistamines). If the area involved is on your face, you may be referred to a specialist, such as an eye doctor (ophthalmologist) or an ear, nose, and throat (ENT) doctor (otolaryngologist) to help you avoid eye problems, chronic pain, or disability. Follow these instructions at home: Medicines  Take over-the-counter and prescription medicines only as told by your health care provider.  Apply an anti-itch cream or numbing cream to the affected area as told by your health care provider. Relieving itching and discomfort   Apply cold, wet cloths (cold compresses) to the area of the rash or blisters as told by your health care provider.  Cool baths can be soothing. Try adding baking soda or dry oatmeal to the water to reduce itching. Do not bathe in hot water. Blister and rash care  Keep your rash covered with a loose bandage (dressing). Wear loose-fitting clothing to help ease the pain of material rubbing against the rash.  Keep your rash and blisters clean by washing  the area with mild soap and cool water as told by your health care provider.  Check your rash every day for signs of infection. Check for: ? More redness, swelling, or pain. ? Fluid or blood. ? Warmth. ? Pus or a bad smell.  Do not scratch your rash or pick at your blisters. To help avoid scratching: ? Keep your fingernails clean and cut short. ? Wear gloves or mittens while you sleep, if  scratching is a problem. General instructions  Rest as told by your health care provider.  Keep all follow-up visits as told by your health care provider. This is important.  Wash your hands often with soap and water. If soap and water are not available, use hand sanitizer. Doing this lowers your chance of getting a bacterial skin infection.  Before your blisters change into scabs, your shingles infection can cause chickenpox in people who have never had it or have never been vaccinated against it. To prevent this from happening, avoid contact with other people, especially: ? Babies. ? Pregnant women. ? Children who have eczema. ? Elderly people who have transplants. ? People who have chronic illnesses, such as cancer or AIDS. Contact a health care provider if:  Your pain is not relieved with prescribed medicines.  Your pain does not get better after the rash heals.  You have signs of infection in the rash area, such as: ? More redness, swelling, or pain around the rash. ? Fluid or blood coming from the rash. ? The rash area feeling warm to the touch. ? Pus or a bad smell coming from the rash. Get help right away if:  The rash is on your face or nose.  You have facial pain, pain around your eye area, or loss of feeling on one side of your face.  You have difficulty seeing.  You have ear pain or have ringing in your ear.  You have a loss of taste.  Your condition gets worse. Summary  Shingles, which is also known as herpes zoster, is an infection that causes a painful skin rash and fluid-filled blisters.  This condition is diagnosed with a skin exam. Skin or fluid samples may be taken from the blisters and examined before the diagnosis is made.  Keep your rash covered with a loose bandage (dressing). Wear loose-fitting clothing to help ease the pain of material rubbing against the rash.  Before your blisters change into scabs, your shingles infection can cause chickenpox  in people who have never had it or have never been vaccinated against it. This information is not intended to replace advice given to you by your health care provider. Make sure you discuss any questions you have with your health care provider. Document Released: 01/05/2005 Document Revised: 09/09/2016 Document Reviewed: 09/09/2016 Elsevier Interactive Patient Education  2019 [employer].

## 2018-03-31 ENCOUNTER — Other Ambulatory Visit: Payer: Self-pay | Admitting: Internal Medicine

## 2018-04-20 ENCOUNTER — Other Ambulatory Visit: Payer: Self-pay | Admitting: Rheumatology

## 2018-04-20 ENCOUNTER — Other Ambulatory Visit: Payer: Self-pay | Admitting: Internal Medicine

## 2018-04-20 NOTE — Telephone Encounter (Signed)
Last Visit: 01/26/2018 Next Visit: 06/29/2018 Labs: 11/26/2017 WNL Eye exam: 01/06/2018   Okay to refill per Dr. Estanislado Pandy.

## 2018-05-19 NOTE — Progress Notes (Signed)
Virtual Visit via Video Note  I connected with Angela Simmons on 05/20/18 at 11:15 AM EDT by a video enabled telemedicine application and verified that I am speaking with the correct person using two identifiers.   I discussed the limitations of evaluation and management by telemedicine and the availability of in person appointments. The patient expressed understanding and agreed to proceed.  The patient is currently at home and I am in the office.    No referring provider.    History of Present Illness: She is here for follow up of her chronic medical conditions.   She is exercising regularly.    Hypertension: She is taking her medication daily. She is compliant with a low sodium diet.  She denies chest pain, palpitations, shortness of breath and regular headaches. She does not monitor her blood pressure at home.    Hyperlipidemia: She is taking her medication daily. She is compliant with a low fat/cholesterol diet. She denies myalgias.   GERD:  She is taking her medication daily as prescribed.  She has had some GERD symptoms recently, but thinks it is from drinking too much lemonade.  She also has noticed some increase saliva in her mouth at night and wondered if that was related to GERD.  She just started taking a probiotic and is hoping that will help.    Diabetes: She is controlling her sugars with diet. She is compliant with a diabetic diet.  She monitors her sugars and they have been running 100 or less.    Her legs stay cold a lot.  It is just her legs.  Walking around makes it worse.  They feel tired when walking around.  She denies leg pain.    Review of Systems  Constitutional: Negative for chills and fever.  Respiratory: Positive for cough (sometimes at night). Negative for shortness of breath and wheezing.   Cardiovascular: Positive for leg swelling (L > R ankle). Negative for chest pain and palpitations.  Gastrointestinal: Positive for heartburn.  Musculoskeletal:  Positive for joint pain (mild). Negative for back pain.  Neurological: Positive for dizziness (occasionally) and headaches (occasional).      Social History   Socioeconomic History  . Marital status: Married    Spouse name: Not on file  . Number of children: 3  . Years of education: 7  . Highest education level: Not on file  Occupational History  . Not on file  Social Needs  . Financial resource strain: Not on file  . Food insecurity:    Worry: Not on file    Inability: Not on file  . Transportation needs:    Medical: Not on file    Non-medical: Not on file  Tobacco Use  . Smoking status: Never Smoker  . Smokeless tobacco: Never Used  Substance and Sexual Activity  . Alcohol use: No  . Drug use: Never  . Sexual activity: Never    Birth control/protection: Rhythm  Lifestyle  . Physical activity:    Days per week: Not on file    Minutes per session: Not on file  . Stress: Not on file  Relationships  . Social connections:    Talks on phone: Not on file    Gets together: Not on file    Attends religious service: Not on file    Active member of club or organization: Not on file    Attends meetings of clubs or organizations: Not on file    Relationship status: Not on file  Other Topics Concern  . Not on file  Social History Narrative   Lives at home with husband   Caffeine, maybe 1 a day     Observations/Objective: Appears well in NAD  Bp at home 144/84 typically  BP Readings from Last 3 Encounters:  03/23/18 (!) 152/82  01/26/18 134/86  11/26/17 140/80    Lab Results  Component Value Date   WBC 4.4 11/26/2017   HGB 12.3 11/26/2017   HCT 36.9 11/26/2017   PLT 235.0 11/26/2017   GLUCOSE 80 11/26/2017   CHOL 123 11/26/2017   TRIG 39.0 11/26/2017   HDL 52.40 11/26/2017   LDLCALC 63 11/26/2017   ALT 17 11/26/2017   AST 18 11/26/2017   NA 141 11/26/2017   K 3.8 11/26/2017   CL 104 11/26/2017   CREATININE 0.92 11/26/2017   BUN 11 11/26/2017   CO2  31 11/26/2017   TSH 2.31 02/26/2016   HGBA1C 6.3 11/26/2017   MICROALBUR 1.1 02/26/2016     Assessment and Plan:  See Problem List for Assessment and Plan of chronic medical problems.   Follow Up Instructions:    I discussed the assessment and treatment plan with the patient. The patient was provided an opportunity to ask questions and all were answered. The patient agreed with the plan and demonstrated an understanding of the instructions.   The patient was advised to call back or seek an in-person evaluation if the symptoms worsen or if the condition fails to improve as anticipated.    Binnie Rail, MD

## 2018-05-20 ENCOUNTER — Encounter: Payer: Self-pay | Admitting: Internal Medicine

## 2018-05-20 ENCOUNTER — Ambulatory Visit (INDEPENDENT_AMBULATORY_CARE_PROVIDER_SITE_OTHER): Payer: PPO | Admitting: Internal Medicine

## 2018-05-20 DIAGNOSIS — I10 Essential (primary) hypertension: Secondary | ICD-10-CM | POA: Diagnosis not present

## 2018-05-20 DIAGNOSIS — E7849 Other hyperlipidemia: Secondary | ICD-10-CM | POA: Diagnosis not present

## 2018-05-20 DIAGNOSIS — K219 Gastro-esophageal reflux disease without esophagitis: Secondary | ICD-10-CM | POA: Diagnosis not present

## 2018-05-20 DIAGNOSIS — E1140 Type 2 diabetes mellitus with diabetic neuropathy, unspecified: Secondary | ICD-10-CM | POA: Diagnosis not present

## 2018-05-20 NOTE — Assessment & Plan Note (Signed)
Not ideally controlled Likely the cause of her cough Decrease lemonade, avoid eating too close to laying down Started probiotics Continue omeprazole 40 mg daily She deferred other medication at this time Work on changes above Can start pepcid in evening if no improvement

## 2018-05-20 NOTE — Assessment & Plan Note (Signed)
Continue statin. 

## 2018-05-20 NOTE — Assessment & Plan Note (Signed)
BP Readings from Last 3 Encounters:  03/23/18 (!) 152/82  01/26/18 134/86  11/26/17 140/80    BP at home around 144/84  BP well controlled Current regimen effective and well tolerated Continue current medications at current doses

## 2018-05-20 NOTE — Assessment & Plan Note (Signed)
Diet controlled  Lab Results  Component Value Date   HGBA1C 6.3 11/26/2017    Low sugar / carb diet Stressed regular exercise  FU in 6 months

## 2018-06-07 ENCOUNTER — Other Ambulatory Visit (INDEPENDENT_AMBULATORY_CARE_PROVIDER_SITE_OTHER): Payer: PPO

## 2018-06-07 ENCOUNTER — Encounter: Payer: Self-pay | Admitting: Internal Medicine

## 2018-06-07 ENCOUNTER — Other Ambulatory Visit: Payer: Self-pay

## 2018-06-07 ENCOUNTER — Ambulatory Visit (INDEPENDENT_AMBULATORY_CARE_PROVIDER_SITE_OTHER): Payer: PPO | Admitting: Internal Medicine

## 2018-06-07 VITALS — BP 146/92 | HR 68 | Temp 97.5°F | Resp 16 | Ht 69.5 in | Wt 201.8 lb

## 2018-06-07 DIAGNOSIS — R109 Unspecified abdominal pain: Secondary | ICD-10-CM | POA: Insufficient documentation

## 2018-06-07 DIAGNOSIS — R350 Frequency of micturition: Secondary | ICD-10-CM | POA: Diagnosis not present

## 2018-06-07 DIAGNOSIS — R42 Dizziness and giddiness: Secondary | ICD-10-CM | POA: Diagnosis not present

## 2018-06-07 DIAGNOSIS — R1030 Lower abdominal pain, unspecified: Secondary | ICD-10-CM | POA: Diagnosis not present

## 2018-06-07 LAB — CBC WITH DIFFERENTIAL/PLATELET
Basophils Absolute: 0 10*3/uL (ref 0.0–0.1)
Basophils Relative: 0.6 % (ref 0.0–3.0)
Eosinophils Absolute: 0 10*3/uL (ref 0.0–0.7)
Eosinophils Relative: 0.1 % (ref 0.0–5.0)
HCT: 38.5 % (ref 36.0–46.0)
Hemoglobin: 12.8 g/dL (ref 12.0–15.0)
Lymphocytes Relative: 12.8 % (ref 12.0–46.0)
Lymphs Abs: 0.8 10*3/uL (ref 0.7–4.0)
MCHC: 33.3 g/dL (ref 30.0–36.0)
MCV: 92.1 fl (ref 78.0–100.0)
Monocytes Absolute: 0.3 10*3/uL (ref 0.1–1.0)
Monocytes Relative: 5.8 % (ref 3.0–12.0)
Neutro Abs: 4.8 10*3/uL (ref 1.4–7.7)
Neutrophils Relative %: 80.7 % — ABNORMAL HIGH (ref 43.0–77.0)
Platelets: 218 10*3/uL (ref 150.0–400.0)
RBC: 4.18 Mil/uL (ref 3.87–5.11)
RDW: 14.4 % (ref 11.5–15.5)
WBC: 5.9 10*3/uL (ref 4.0–10.5)

## 2018-06-07 LAB — URINALYSIS, ROUTINE W REFLEX MICROSCOPIC
Bilirubin Urine: NEGATIVE
Hgb urine dipstick: NEGATIVE
Ketones, ur: NEGATIVE
Leukocytes,Ua: NEGATIVE
Nitrite: NEGATIVE
RBC / HPF: NONE SEEN (ref 0–?)
Specific Gravity, Urine: 1.015 (ref 1.000–1.030)
Total Protein, Urine: NEGATIVE
Urine Glucose: NEGATIVE
Urobilinogen, UA: 0.2 (ref 0.0–1.0)
pH: 7 (ref 5.0–8.0)

## 2018-06-07 LAB — COMPREHENSIVE METABOLIC PANEL
ALT: 18 U/L (ref 0–35)
AST: 20 U/L (ref 0–37)
Albumin: 4.1 g/dL (ref 3.5–5.2)
Alkaline Phosphatase: 73 U/L (ref 39–117)
BUN: 10 mg/dL (ref 6–23)
CO2: 29 mEq/L (ref 19–32)
Calcium: 9.4 mg/dL (ref 8.4–10.5)
Chloride: 103 mEq/L (ref 96–112)
Creatinine, Ser: 0.77 mg/dL (ref 0.40–1.20)
GFR: 89.15 mL/min (ref >60.00)
Glucose, Bld: 100 mg/dL — ABNORMAL HIGH (ref 70–99)
Potassium: 3.7 mEq/L (ref 3.5–5.1)
Sodium: 139 mEq/L (ref 135–145)
Total Bilirubin: 0.5 mg/dL (ref 0.2–1.2)
Total Protein: 8 g/dL (ref 6.0–8.3)

## 2018-06-07 MED ORDER — MECLIZINE HCL 12.5 MG PO TABS: 12.5000 mg | ORAL_TABLET | Freq: Three times a day (TID) | ORAL | 0 refills | Status: DC | PRN

## 2018-06-07 NOTE — Assessment & Plan Note (Signed)
Experiencing vertigo No obvious infection Possibly BPPV versus labyrinthitis Increase hydration Rest Meclizine as needed She will call if her symptoms do not improve

## 2018-06-07 NOTE — Assessment & Plan Note (Signed)
Having increased urinary frequency, no other urine symptoms Will rule out UTI given all of her other symptoms Urinalysis, urine culture

## 2018-06-07 NOTE — Progress Notes (Signed)
Subjective:    Patient ID: Angela Simmons, female    DOB: [birth year], 73 y.o.   MRN: [id number]  HPI The patient is here for an acute visit.    Her symptoms started yesterday.  When she woke up yesterday she had little energy and felt tired.  She did not feel like doing anything all day.  She did have 3 pieces of pizza last night and when she went to bed she felt fine.  Around 6 AM this morning she went to the bathroom and she felt like everything was going black and the room was spinning when she walked to the bathroom and back from the bathroom.  She has had an intermittent headache since then in the frontal region.  She is also had lower abdominal pain across her abdomen.  Her headache did improve with Tylenol.  She did feel like when she moves her head she would start to get that spinning sensation again in things would want to turn black.  This morning she was nauseous and did have one episode of vomiting.  She denies any diarrhea-she feels she is more constipated.  She did have a bowel movement today.  She has had urinary frequency, but denies any dysuria, hematuria or change in the color/odor.  She checked her blood pressure at home and today was 148/92.  Yesterday it was around 944 systolically and last Friday 135/80.  Typically it is well controlled.  Today she is not eating anything because she does not have an appetite.  She denies any sick contacts.     Medications and allergies reviewed with patient and updated if appropriate.  Patient Active Problem List   Diagnosis Date Noted  . Herpes zoster without complication 96/75/9163  . Tingling in extremities 11/26/2017  . Ischial bursitis of left side 06/02/2017  . Rash and nonspecific skin eruption 04/23/2017  . Sicca syndrome ([place]) 06/23/2016  . Bilateral leg edema 04/07/2016  . Burning sensation of skin 04/07/2016  . PMR (polymyalgia rheumatica) ([facility]) 03/27/2016  . Osteopenia 03/04/2016  . Autoimmune disease ([place])  03/18/2015  . Inflammatory arthritis 03/18/2015  . Joint pain 11/22/2014  . Overweight (BMI 25.0-29.9) 08/18/2014  . Low back pain with radiation 08/18/2014  . Hyperlipidemia 06/28/2014  . Chest pain, atypical 04/11/2014  . Tendonitis of shoulder 08/29/2013  . S/P right mastectomy 11/14/2012  . Type 2 diabetes, controlled, with neuropathy ([place]) 10/05/2012  . Allergic rhinitis 01/26/2011  . GERD 11/10/2009  . INTERSTITIAL CYSTITIS 10/31/2008  . BENIGN NEOPLASM OF VULVA 02/01/2008  . Essential hypertension, benign 02/08/2007  . History of cancer of right breast 02/08/2007    Current Outpatient Medications on File Prior to Visit  Medication Sig Dispense Refill  . amLODipine (NORVASC) 5 MG tablet TAKE ONE TABLET BY MOUTH EVERY DAY 90 tablet 1  . aspirin (ADULT ASPIRIN EC LOW STRENGTH) 81 MG EC tablet Take 81 mg by mouth daily.     Marland Kitchen atorvastatin (LIPITOR) 10 MG tablet Take 1 tablet (10 mg total) by mouth daily. 90 tablet 3  . benazepril (LOTENSIN) 40 MG tablet TAKE 1 TABLET BY MOUTH EVERY DAY 90 tablet 1  . Blood Glucose Monitoring Suppl (ONETOUCH VERIO) w/Device KIT USE TO TEST BLOOD SUGAR ONCE DAILY AS DIRECTED 1 kit 0  . calcium-vitamin D (OSCAL WITH D) 500-200 MG-UNIT tablet Take 1 tablet by mouth daily with breakfast.    . diclofenac sodium (VOLTAREN) 1 % GEL Apply 3 g to 3 large joints up  to 3 times daily. 3 Tube 3  . fluticasone (FLONASE) 50 MCG/ACT nasal spray USE 2 SPRAYS IN EACH NOSTRIL EVERY DAY 48 g 0  . glucose blood test strip USE TO TEST BLOOD SUGAR ONCE DAILY 100 each 0  . hydroxychloroquine (PLAQUENIL) 200 MG tablet TAKE 1 TABLET BY MOUTH TWICE DAILY 180 tablet 0  . loratadine (CLARITIN) 10 MG tablet TAKE 1 TABLET BY MOUTH EVERY DAY (Patient taking differently: TAKE 1 TABLET BY MOUTH EVERY DAY PRN) 30 tablet 2  . Multiple Vitamin (MULTIVITAMIN) tablet Take 1 tablet by mouth daily.    . Omega-3 Fatty Acids (FISH OIL) 1000 MG CAPS Take 1 capsule by mouth daily.     Marland Kitchen  omeprazole (PRILOSEC) 40 MG capsule TAKE ONE CAPSULE BY MOUTH DAILY 90 capsule 1  . ONETOUCH DELICA LANCETS 44I MISC USE TO TEST ONCE DAILY 100 each 2  . triamcinolone ointment (KENALOG) 0.5 % Apply 1 application topically 2 (two) times daily. Do not use for more than 14 days in a row 15 g 0   No current facility-administered medications on file prior to visit.     Past Medical History:  Diagnosis Date  . Breast cancer ([place]) 1990s   right breast  . Diabetes mellitus without complication ([place])   . GERD (gastroesophageal reflux disease)   . HTN (hypertension)   . Hypercholesterolemia   . Obesity     Past Surgical History:  Procedure Laterality Date  . ABDOMINAL HYSTERECTOMY  1993   [place]  . COLONOSCOPY  May 2003   Dr. Tamala Julian: normal  . COLONOSCOPY  06/22/2011   Procedure: COLONOSCOPY;  Surgeon: Danie Binder, MD;  Location: [facility];  Service: Endoscopy;  Laterality: N/A;  10:30  . MASTECTOMY  1995   right     Social History   Socioeconomic History  . Marital status: Married    Spouse name: Not on file  . Number of children: 3  . Years of education: 7  . Highest education level: Not on file  Occupational History  . Not on file  Social Needs  . Financial resource strain: Not on file  . Food insecurity:    Worry: Not on file    Inability: Not on file  . Transportation needs:    Medical: Not on file    Non-medical: Not on file  Tobacco Use  . Smoking status: Never Smoker  . Smokeless tobacco: Never Used  Substance and Sexual Activity  . Alcohol use: No  . Drug use: Never  . Sexual activity: Never    Birth control/protection: Rhythm  Lifestyle  . Physical activity:    Days per week: Not on file    Minutes per session: Not on file  . Stress: Not on file  Relationships  . Social connections:    Talks on phone: Not on file    Gets together: Not on file    Attends religious service: Not on file    Active member of club or organization: Not on file     Attends meetings of clubs or organizations: Not on file    Relationship status: Not on file  Other Topics Concern  . Not on file  Social History Narrative   Lives at home with husband   Caffeine, maybe 1 a day    Family History  Problem Relation Age of Onset  . Diabetes Mother        diabetic coma  . Heart failure Mother   .  Diabetes Father   . Emphysema Father   . Diabetes Sister   . Hypercholesterolemia Sister   . Hypercholesterolemia Brother   . Diabetes Brother   . Hypertension Brother   . Hypercholesterolemia Brother   . Diabetes Sister   . Hypertension Sister   . Hypercholesterolemia Sister   . Hypercholesterolemia Sister   . Hypercholesterolemia Sister   . Diabetes Brother   . Hypertension Brother   . Colon cancer Neg Hx     Review of Systems  Constitutional: Positive for appetite change (decresaed today), chills and diaphoresis (last night). Negative for fever.  HENT: Positive for sore throat (last week only). Negative for congestion, ear pain and sinus pain.   Respiratory: Positive for shortness of breath (with exertion - not new). Negative for cough and wheezing.   Gastrointestinal: Positive for abdominal pain (lower abdomen), constipation, nausea and vomiting (x 1 this morning around 7 am). Negative for diarrhea.  Genitourinary: Positive for frequency. Negative for dysuria and hematuria.  Neurological: Positive for dizziness and headaches.       Objective:   Vitals:   06/07/18 1021  BP: (!) 146/92  Pulse: 68  Resp: 16  Temp: (!) 97.5 F (36.4 C)  SpO2: 98%   BP Readings from Last 3 Encounters:  06/07/18 (!) 146/92  03/23/18 (!) 152/82  01/26/18 134/86   Wt Readings from Last 3 Encounters:  06/07/18 201 lb 12.8 oz (91.5 kg)  03/23/18 201 lb 6.4 oz (91.4 kg)  01/26/18 204 lb 12.8 oz (92.9 kg)   Body mass index is 29.37 kg/m.   Physical Exam Constitutional:      General: She is not in acute distress.    Appearance: She is well-developed.  She is not ill-appearing.  HENT:     Head: Normocephalic and atraumatic.     Right Ear: Tympanic membrane and ear canal normal.     Left Ear: Tympanic membrane and ear canal normal.     Mouth/Throat:     Mouth: Mucous membranes are moist.     Pharynx: Oropharynx is clear.  Cardiovascular:     Rate and Rhythm: Normal rate and regular rhythm.     Heart sounds: No murmur.  Pulmonary:     Effort: Pulmonary effort is normal. No respiratory distress.     Breath sounds: Normal breath sounds. No wheezing or rales.  Abdominal:     General: There is no distension.     Palpations: Abdomen is soft.     Tenderness: There is abdominal tenderness (Across lower abdomen). There is no guarding or rebound.     Hernia: No hernia is present.  Skin:    General: Skin is warm and dry.  Neurological:     Mental Status: She is alert.            Assessment & Plan:    See Problem List for Assessment and Plan of chronic medical problems.

## 2018-06-07 NOTE — Patient Instructions (Addendum)
Have blood work and urine tests done today.   We will call you with the results.   Take tylenol for your headaches.   Increase your fluids.  Rest.   Take meclizine for the dizziness as needed.    Eat bland food - small amounts at a time.

## 2018-06-07 NOTE — Assessment & Plan Note (Signed)
She has had some lower abdominal pain Has had some increased urinary frequency as well and some mild constipation although she did have a bowel movement this morning No rebound or guarding and pain is mild Check urinalysis, urine culture Check CBC, CMP Bland diet, small meals Call if no improvement

## 2018-06-08 ENCOUNTER — Telehealth: Payer: Self-pay | Admitting: Emergency Medicine

## 2018-06-08 NOTE — Telephone Encounter (Signed)
Daughter aware of response. Will try the OTC non drowsy dramamine.

## 2018-06-08 NOTE — Telephone Encounter (Signed)
Spoke with pt's daughter, states the meclizine has made the dizziness worst. Daughter has put sea bands on patient to help with motion sickness. Pt is not able to get up from bed without falling into the wall. Is there an alternative to meclizine.

## 2018-06-08 NOTE — Telephone Encounter (Signed)
Can try otc non-drowsy dramamine or we can try a low dose of valium, which may make her drowsy as well.

## 2018-06-09 LAB — URINE CULTURE
MICRO NUMBER:: 487359
Result:: NO GROWTH
SPECIMEN QUALITY:: ADEQUATE

## 2018-06-16 NOTE — Progress Notes (Signed)
Office Visit Note  Patient: Angela Simmons             Date of Birth: September 08, 1945           MRN: [id number]             PCP: Binnie Rail, MD Referring: Binnie Rail, MD Visit Date: 06/29/2018 Occupation: @GUAROCC @  Subjective:  Myalgias    History of Present Illness: Angela Simmons is a 73 y.o. female with history of PMR, autoimmune disease, and trochanteric bursitis.  She takes Plaquenil 200 mg 1 tablet BID.  She denies any recent flares of PMR or autoimmune disease. She denies any joint pain or joint swelling.  She has muscle aches and muscle tenderness. She denies any muscle weakness. She has no difficulty getting up from a chair or raising arms above her head.  She denies any recent rashes.  She denies any sores in mouth or nose.  She continues to have sicca symptoms.  She has been having episodes of vertigo. She was started on Meclizine which she reports made her symptoms worse.   Activities of Daily Living:  Patient reports morning stiffness for 0 none.   Patient Denies nocturnal pain.  Difficulty dressing/grooming: Denies Difficulty climbing stairs: Reports Difficulty getting out of chair: Reports Difficulty using hands for taps, buttons, cutlery, and/or writing: Reports  Review of Systems  Constitutional: Positive for fatigue.  HENT: Positive for mouth dryness. Negative for mouth sores and nose dryness.   Eyes: Positive for dryness. Negative for pain and visual disturbance.  Respiratory: Negative for cough, hemoptysis, shortness of breath and difficulty breathing.   Cardiovascular: Positive for swelling in legs/feet. Negative for chest pain, palpitations and hypertension.  Gastrointestinal: Positive for constipation. Negative for blood in stool and diarrhea.  Endocrine: Negative for increased urination.  Genitourinary: Negative for difficulty urinating and painful urination.  Musculoskeletal: Positive for arthralgias, joint pain, joint swelling, muscle weakness and  muscle tenderness. Negative for myalgias, morning stiffness and myalgias.  Skin: Positive for rash. Negative for color change, pallor, hair loss, nodules/bumps, skin tightness, ulcers and sensitivity to sunlight.  Allergic/Immunologic: Negative for susceptible to infections.  Neurological: Negative for dizziness and headaches.  Hematological: Negative for swollen glands.  Psychiatric/Behavioral: Negative for depressed mood and sleep disturbance. The patient is not nervous/anxious.     PMFS History:  Patient Active Problem List   Diagnosis Date Noted  . Vertigo 06/07/2018  . Urinary frequency 06/07/2018  . Lower abdominal pain 06/07/2018  . Herpes zoster without complication 40/98/1191  . Tingling in extremities 11/26/2017  . Ischial bursitis of left side 06/02/2017  . Sicca syndrome ([place]) 06/23/2016  . Bilateral leg edema 04/07/2016  . Burning sensation of skin 04/07/2016  . PMR (polymyalgia rheumatica) (HCC) 03/27/2016  . Osteopenia 03/04/2016  . Autoimmune disease ([place]) 03/18/2015  . Inflammatory arthritis 03/18/2015  . Joint pain 11/22/2014  . Overweight (BMI 25.0-29.9) 08/18/2014  . Low back pain with radiation 08/18/2014  . Hyperlipidemia 06/28/2014  . Chest pain, atypical 04/11/2014  . Tendonitis of shoulder 08/29/2013  . S/P right mastectomy 11/14/2012  . Type 2 diabetes, controlled, with neuropathy ([place]) 10/05/2012  . Allergic rhinitis 01/26/2011  . GERD 11/10/2009  . INTERSTITIAL CYSTITIS 10/31/2008  . BENIGN NEOPLASM OF VULVA 02/01/2008  . Essential hypertension, benign 02/08/2007  . History of cancer of right breast 02/08/2007    Past Medical History:  Diagnosis Date  . Breast cancer ([place]) 1990s   right breast  . Diabetes  mellitus without complication ([place])   . GERD (gastroesophageal reflux disease)   . HTN (hypertension)   . Hypercholesterolemia   . Obesity     Family History  Problem Relation Age of Onset  . Diabetes Mother        diabetic coma  .  Heart failure Mother   . Diabetes Father   . Emphysema Father   . Diabetes Sister   . Hypercholesterolemia Sister   . Hypercholesterolemia Brother   . Diabetes Brother   . Hypertension Brother   . Hypercholesterolemia Brother   . Diabetes Sister   . Hypertension Sister   . Hypercholesterolemia Sister   . Hypercholesterolemia Sister   . Hypercholesterolemia Sister   . Diabetes Brother   . Hypertension Brother   . Colon cancer Neg Hx    Past Surgical History:  Procedure Laterality Date  . ABDOMINAL [place]CTOMY  [place]   [place]  . COLONOSCOPY  May 2003   Dr. Tamala Julian: normal  . COLONOSCOPY  06/22/2011   Procedure: COLONOSCOPY;  Surgeon: Danie Binder, MD;  Location[facility];  Service: Endoscopy;  Laterality: N/A;  10:30  . MASTECTOMY  1995   right    Social History   Social History Narrative   Lives at home with husband   Caffeine, maybe 1 a day   Immunization History  Administered Date(s) Administered  . Influenza Split 11/14/2010  . Influenza Whole 10/18/2006, 10/31/2008, 11/06/2009  . Influenza, High Dose Seasonal PF 11/22/2014, 02/21/2016, 12/18/2016, 11/05/2017  . Influenza,inj,Quad PF,6+ Mos 11/14/2012, 11/23/2013  . Pneumococcal Conjugate-13 04/11/2014  . Pneumococcal Polysaccharide-23 09/17/2011  . Td 07/05/2003  . Zoster 06/15/2013  . Zoster Recombinat (Shingrix) 11/30/2017, 02/02/2018     Objective: Vital Signs: BP 135/82 (BP Location: Left Arm, Patient Position: Sitting, Cuff Size: Normal)   Pulse 90   Resp 16   Ht 5' 9.5" (1.765 m)   Wt 201 lb 9.6 oz (91.4 kg)   BMI 29.34 kg/m    Physical Exam Vitals signs and nursing note reviewed.  Constitutional:      Appearance: She is well-developed.  HENT:     Head: Normocephalic and atraumatic.  Eyes:     Conjunctiva/sclera: Conjunctivae normal.  Neck:     Musculoskeletal: Normal range of motion.  Cardiovascular:     Rate and Rhythm: Normal rate and regular rhythm.     Heart sounds: Normal  heart sounds.  Pulmonary:     Effort: Pulmonary effort is normal.     Breath sounds: Normal breath sounds.  Abdominal:     General: Bowel sounds are normal.     Palpations: Abdomen is soft.  Lymphadenopathy:     Cervical: No cervical adenopathy.  Skin:    General: Skin is warm and dry.     Capillary Refill: Capillary refill takes less than 2 seconds.  Neurological:     Mental Status: She is alert and oriented to person, place, and time.  Psychiatric:        Behavior: Behavior normal.      Musculoskeletal Exam: C-spine  and lumbar spine good ROM. Thoracic kyphosis noted. No midline spinal tenderness. Shoulder joints, elbow joints, wrist joints, MCPs, PIPs, and DIPs good ROM with no synovitis.  PIP and DIP synovial thickening.  Bilateral CMC joint synovial thickening.  Hip joints, knee joints, ankle joints, MTPs, PIPs, and DIPs good ROM with no synovitis.  No warmth or effusion of knee joints.  No tenderness or swelling of ankle joints.  CDAI Exam: CDAI Score: Not documented Patient Global Assessment: Not documented; Provider Global Assessment: Not documented Swollen: Not documented; Tender: Not documented Joint Exam   Not documented   There is currently no information documented on the homunculus. Go to the Rheumatology activity and complete the homunculus joint exam.  Investigation: No additional findings.  Imaging: No results found.  Recent Labs: Lab Results  Component Value Date   WBC 5.9 06/07/2018   HGB 12.8 06/07/2018   PLT 218.0 06/07/2018   NA 139 06/07/2018   K 3.7 06/07/2018   CL 103 06/07/2018   CO2 29 06/07/2018   GLUCOSE 100 (H) 06/07/2018   BUN 10 06/07/2018   CREATININE 0.77 06/07/2018   BILITOT 0.5 06/07/2018   ALKPHOS 73 06/07/2018   AST 20 06/07/2018   ALT 18 06/07/2018   PROT 8.0 06/07/2018   ALBUMIN 4.1 06/07/2018   CALCIUM 9.4 06/07/2018   GFRAA 71 06/02/2017    Speciality Comments: PLQ EYe Exam: 01/06/18 WNL @ [facility] up  in 1 year  Procedures:  No procedures performed Allergies: Levofloxacin; Nitrofurantoin; Penicillins; Sulfonamide derivatives; and Pantoprazole   Assessment / Plan:     Visit Diagnoses: PMR (polymyalgia rheumatica) ([place]): She has not had any recent signs or symptoms of a PMR flare.  She has no muscle weakness.  She has no difficulty getting up from a chair or raising her arms above her head.  She experiences muscle tenderness and muscle aches, but she has not noticed any new or worsening symptoms.  She is not taking prednisone at this time.  She requested to have her sed rate checked today.  She was advised to notify us if she develops any new or worsening symptoms.  She will follow up in 5 months.- Plan: Sedimentation rate  Autoimmune disease ([place]) -  ANA+, sicca symptoms: She has not had any recent signs or symptoms of a flare.  She is clinically doing well on Plaquenil 200 mg 1 tablet by mouth twice daily.  She has no synovitis on exam. She has no oral or nasal ulcerations.  She has not had any recent rashes or hair loss.  She has not had any symptoms of Raynaud's recently. She has chronic sicca symptoms.  She will continue on PLQ 200 mg BID.  She does not need any refills at this time.  Sed rate, complements and dsDNA will be checked today.  She was advised to notify us if she develops any new or worsening symptoms. - Plan: Sedimentation rate, C3 and C4, Anti-DNA antibody, double-stranded  High risk medication use - She is on Plaquenil 200 mg twice daily.  Last Plaquenil eye exam normal on 01/06/2018.  Most recent CBC/CMP within normal limits on 06/07/2018 and will monitor every 5 months.  Sicca syndrome ([place]): She has chronic sicca symptoms.   Trochanteric bursitis of both hips: She has tenderness over bilateral trochanteric bursa.  She was encouraged to perform stretching exercises regularly.   Ischial bursitis of left side: Resolved   Osteopenia of multiple sites: She is taking a calcium and  vitamin D supplement.   Other medical conditions are listed as follows:   History of peripheral neuropathy  Other fatigue  History of gastroesophageal reflux (GERD)  History of hyperlipidemia  S/P right mastectomy  History of cancer of right breast   Orders: Orders Placed This Encounter  Procedures  . Sedimentation rate  . C3 and C4  . Anti-DNA antibody, double-stranded   No orders of the  defined types were placed in this encounter.   Follow-Up Instructions: Return in about 5 months (around 11/29/2018) for PMR, Autoimmune Disease.   Ofilia Neas, PA-C  Note - This record has been created using Dragon software.  Chart creation errors have been sought, but may not always  have been located. Such creation errors do not reflect on  the standard of medical care.

## 2018-06-22 DIAGNOSIS — C50911 Malignant neoplasm of unspecified site of right female breast: Secondary | ICD-10-CM | POA: Diagnosis not present

## 2018-06-29 ENCOUNTER — Other Ambulatory Visit: Payer: Self-pay

## 2018-06-29 ENCOUNTER — Ambulatory Visit: Payer: PPO | Admitting: Physician Assistant

## 2018-06-29 ENCOUNTER — Encounter: Payer: Self-pay | Admitting: Physician Assistant

## 2018-06-29 VITALS — BP 135/82 | HR 90 | Resp 16 | Ht 69.5 in | Wt 201.6 lb

## 2018-06-29 DIAGNOSIS — Z8669 Personal history of other diseases of the nervous system and sense organs: Secondary | ICD-10-CM

## 2018-06-29 DIAGNOSIS — R5383 Other fatigue: Secondary | ICD-10-CM | POA: Diagnosis not present

## 2018-06-29 DIAGNOSIS — M359 Systemic involvement of connective tissue, unspecified: Secondary | ICD-10-CM | POA: Diagnosis not present

## 2018-06-29 DIAGNOSIS — Z79899 Other long term (current) drug therapy: Secondary | ICD-10-CM | POA: Diagnosis not present

## 2018-06-29 DIAGNOSIS — M8589 Other specified disorders of bone density and structure, multiple sites: Secondary | ICD-10-CM | POA: Diagnosis not present

## 2018-06-29 DIAGNOSIS — Z8639 Personal history of other endocrine, nutritional and metabolic disease: Secondary | ICD-10-CM | POA: Diagnosis not present

## 2018-06-29 DIAGNOSIS — M353 Polymyalgia rheumatica: Secondary | ICD-10-CM

## 2018-06-29 DIAGNOSIS — M3500 Sicca syndrome, unspecified: Secondary | ICD-10-CM | POA: Diagnosis not present

## 2018-06-29 DIAGNOSIS — Z8719 Personal history of other diseases of the digestive system: Secondary | ICD-10-CM

## 2018-06-29 DIAGNOSIS — M7062 Trochanteric bursitis, left hip: Secondary | ICD-10-CM

## 2018-06-29 DIAGNOSIS — M7072 Other bursitis of hip, left hip: Secondary | ICD-10-CM | POA: Diagnosis not present

## 2018-06-29 DIAGNOSIS — Z9011 Acquired absence of right breast and nipple: Secondary | ICD-10-CM | POA: Diagnosis not present

## 2018-06-29 DIAGNOSIS — M7061 Trochanteric bursitis, right hip: Secondary | ICD-10-CM

## 2018-06-29 DIAGNOSIS — Z853 Personal history of malignant neoplasm of breast: Secondary | ICD-10-CM

## 2018-06-30 NOTE — Progress Notes (Signed)
Sed rate elevated but stable. We will continue to monitor.

## 2018-07-01 LAB — C3 AND C4
C3 Complement: 147 mg/dL (ref 83–193)
C4 Complement: 49 mg/dL (ref 15–57)

## 2018-07-01 LAB — ANTI-DNA ANTIBODY, DOUBLE-STRANDED: ds DNA Ab: <1 IU/mL

## 2018-07-01 LAB — SEDIMENTATION RATE: Sed Rate: 41 mm/h — ABNORMAL HIGH (ref 0–30)

## 2018-07-01 NOTE — Progress Notes (Signed)
Complements WNL. DsDNA is negative.

## 2018-07-06 ENCOUNTER — Other Ambulatory Visit: Payer: Self-pay | Admitting: Internal Medicine

## 2018-07-06 ENCOUNTER — Other Ambulatory Visit: Payer: Self-pay

## 2018-07-06 MED ORDER — GLUCOSE BLOOD VI STRP: ORAL_STRIP | 3 refills | Status: DC

## 2018-07-06 NOTE — Telephone Encounter (Signed)
Pt's daughter called to see if test strips can be sent in today as pt is completely out. Please advise.

## 2018-07-07 DIAGNOSIS — E1142 Type 2 diabetes mellitus with diabetic polyneuropathy: Secondary | ICD-10-CM | POA: Diagnosis not present

## 2018-07-07 DIAGNOSIS — M79673 Pain in unspecified foot: Secondary | ICD-10-CM | POA: Diagnosis not present

## 2018-07-07 DIAGNOSIS — L84 Corns and callosities: Secondary | ICD-10-CM | POA: Diagnosis not present

## 2018-07-07 DIAGNOSIS — B351 Tinea unguium: Secondary | ICD-10-CM | POA: Diagnosis not present

## 2018-07-08 DIAGNOSIS — H40053 Ocular hypertension, bilateral: Secondary | ICD-10-CM | POA: Diagnosis not present

## 2018-07-08 DIAGNOSIS — H18893 Other specified disorders of cornea, bilateral: Secondary | ICD-10-CM | POA: Diagnosis not present

## 2018-07-08 DIAGNOSIS — H40013 Open angle with borderline findings, low risk, bilateral: Secondary | ICD-10-CM | POA: Diagnosis not present

## 2018-07-30 ENCOUNTER — Other Ambulatory Visit: Payer: Self-pay | Admitting: Rheumatology

## 2018-08-01 NOTE — Telephone Encounter (Signed)
Last Visit: 06/29/18 Next Visit: 11/29/18 Labs: 06/07/18 Neutrophil Relative % 80.7 Glucose 100 Eye exam:  01/06/18 WNL   Okay to refill per Dr. Estanislado Pandy

## 2018-08-08 ENCOUNTER — Ambulatory Visit (INDEPENDENT_AMBULATORY_CARE_PROVIDER_SITE_OTHER): Payer: PPO | Admitting: Rheumatology

## 2018-08-08 ENCOUNTER — Encounter: Payer: Self-pay | Admitting: Rheumatology

## 2018-08-08 ENCOUNTER — Other Ambulatory Visit: Payer: Self-pay

## 2018-08-08 VITALS — BP 141/85 | HR 82 | Resp 18 | Ht 69.0 in | Wt 202.4 lb

## 2018-08-08 DIAGNOSIS — M7072 Other bursitis of hip, left hip: Secondary | ICD-10-CM | POA: Diagnosis not present

## 2018-08-08 DIAGNOSIS — M359 Systemic involvement of connective tissue, unspecified: Secondary | ICD-10-CM

## 2018-08-08 DIAGNOSIS — M353 Polymyalgia rheumatica: Secondary | ICD-10-CM | POA: Diagnosis not present

## 2018-08-08 DIAGNOSIS — M7061 Trochanteric bursitis, right hip: Secondary | ICD-10-CM

## 2018-08-08 DIAGNOSIS — Z8639 Personal history of other endocrine, nutritional and metabolic disease: Secondary | ICD-10-CM

## 2018-08-08 DIAGNOSIS — M3500 Sicca syndrome, unspecified: Secondary | ICD-10-CM

## 2018-08-08 DIAGNOSIS — R5383 Other fatigue: Secondary | ICD-10-CM | POA: Diagnosis not present

## 2018-08-08 DIAGNOSIS — Z8669 Personal history of other diseases of the nervous system and sense organs: Secondary | ICD-10-CM

## 2018-08-08 DIAGNOSIS — Z853 Personal history of malignant neoplasm of breast: Secondary | ICD-10-CM

## 2018-08-08 DIAGNOSIS — Z9011 Acquired absence of right breast and nipple: Secondary | ICD-10-CM | POA: Diagnosis not present

## 2018-08-08 DIAGNOSIS — M7062 Trochanteric bursitis, left hip: Secondary | ICD-10-CM

## 2018-08-08 DIAGNOSIS — M8589 Other specified disorders of bone density and structure, multiple sites: Secondary | ICD-10-CM | POA: Diagnosis not present

## 2018-08-08 DIAGNOSIS — Z79899 Other long term (current) drug therapy: Secondary | ICD-10-CM | POA: Diagnosis not present

## 2018-08-08 DIAGNOSIS — R2681 Unsteadiness on feet: Secondary | ICD-10-CM

## 2018-08-08 DIAGNOSIS — Z8719 Personal history of other diseases of the digestive system: Secondary | ICD-10-CM | POA: Diagnosis not present

## 2018-08-08 NOTE — Progress Notes (Signed)
Office Visit Note  Patient: Angela Simmons             Date of Birth: August 02, 1945           MRN: [id number]             PCP: Binnie Rail, MD Referring: Binnie Rail, MD Visit Date: 08/08/2018 Occupation: @GUAROCC @  Subjective:  Generalized pain   History of Present Illness: Angela Simmons is a 73 y.o. female with history of PMR and autoimmune disease. She is taking Plaquenil 200 mg 1 tablet BID.  She denies missing any doses recently. She is having generalized muscle aches and muscle tenderness. She has no difficulty getting up from a seated position or raising her arms above her head. She is having left elbow joint pain but denies any joint swelling.  She is having neck and lower back pain and stiffness. She reports dizziness and worsening balance.  She denies any recent falls.  She has not been on prednisone recently.   Activities of Daily Living:  Patient reports morning stiffness for 2 hours.   Patient Reports nocturnal pain.  Difficulty dressing/grooming: Denies Difficulty climbing stairs: Reports Difficulty getting out of chair: Reports Difficulty using hands for taps, buttons, cutlery, and/or writing: Reports  Review of Systems  Constitutional: Positive for fatigue.  HENT: Positive for mouth dryness. Negative for mouth sores and nose dryness.   Eyes: Positive for dryness. Negative for pain and visual disturbance.  Respiratory: Negative for cough, hemoptysis, shortness of breath and difficulty breathing.   Cardiovascular: Positive for swelling in legs/feet. Negative for chest pain, palpitations and hypertension.  Gastrointestinal: Negative for blood in stool, constipation and diarrhea.  Endocrine: Negative for increased urination.  Genitourinary: Negative for difficulty urinating and painful urination.  Musculoskeletal: Positive for arthralgias, joint pain, joint swelling, muscle weakness, morning stiffness and muscle tenderness. Negative for myalgias and myalgias.   Skin: Positive for rash. Negative for color change, pallor, hair loss, nodules/bumps, skin tightness, ulcers and sensitivity to sunlight.  Allergic/Immunologic: Negative for susceptible to infections.  Neurological: Negative for dizziness and headaches.  Hematological: Negative for swollen glands.  Psychiatric/Behavioral: Positive for sleep disturbance. Negative for depressed mood. The patient is not nervous/anxious.     PMFS History:  Patient Active Problem List   Diagnosis Date Noted  . Vertigo 06/07/2018  . Urinary frequency 06/07/2018  . Lower abdominal pain 06/07/2018  . Herpes zoster without complication 70/62/3762  . Tingling in extremities 11/26/2017  . Ischial bursitis of left side 06/02/2017  . Sicca syndrome ([place]) 06/23/2016  . Bilateral leg edema 04/07/2016  . Burning sensation of skin 04/07/2016  . PMR (polymyalgia rheumatica) (HCC) 03/27/2016  . Osteopenia 03/04/2016  . Autoimmune disease ([place]) 03/18/2015  . Inflammatory arthritis 03/18/2015  . Joint pain 11/22/2014  . Overweight (BMI 25.0-29.9) 08/18/2014  . Low back pain with radiation 08/18/2014  . Hyperlipidemia 06/28/2014  . Chest pain, atypical 04/11/2014  . Tendonitis of shoulder 08/29/2013  . S/P right mastectomy 11/14/2012  . Type 2 diabetes, controlled, with neuropathy ([place]) 10/05/2012  . Allergic rhinitis 01/26/2011  . GERD 11/10/2009  . INTERSTITIAL CYSTITIS 10/31/2008  . BENIGN NEOPLASM OF VULVA 02/01/2008  . Essential hypertension, benign 02/08/2007  . History of cancer of right breast 02/08/2007    Past Medical History:  Diagnosis Date  . Breast cancer ([place]) 1990s   right breast  . Diabetes mellitus without complication ([place])   . GERD (gastroesophageal reflux disease)   . HTN (hypertension)   .  Hypercholesterolemia   . Obesity     Family History  Problem Relation Age of Onset  . Diabetes Mother        diabetic coma  . Heart failure Mother   . Diabetes Father   . Emphysema Father   .  Diabetes Sister   . Hypercholesterolemia Sister   . Hypercholesterolemia Brother   . Diabetes Brother   . Hypertension Brother   . Hypercholesterolemia Brother   . Diabetes Sister   . Hypertension Sister   . Hypercholesterolemia Sister   . Hypercholesterolemia Sister   . Hypercholesterolemia Sister   . Diabetes Brother   . Hypertension Brother   . Colon cancer Neg Hx    Past Surgical History:  Procedure Laterality Date  . ABDOMINAL HY[place]ERECTOMY  [place]   [place], [place]ids  . COLONOSCOPY  May 2003   Dr. Tamala Julian: normal  . COLONOSCOPY  06/22/2011   Procedure: COLONOSCOPY;  Surgeon: Danie Binder, MD;  Location[facility];  Service: Endoscopy;  Laterality: N/A;  10:30  . MASTECTOMY  1995   right    Social History   Social History Narrative   Lives at home with husband   Caffeine, maybe 1 a day   Immunization History  Administered Date(s) Administered  . Influenza Split 11/14/2010  . Influenza Whole 10/18/2006, 10/31/2008, 11/06/2009  . Influenza, High Dose Seasonal PF 11/22/2014, 02/21/2016, 12/18/2016, 11/05/2017  . Influenza,inj,Quad PF,6+ Mos 11/14/2012, 11/23/2013  . Pneumococcal Conjugate-13 04/11/2014  . Pneumococcal Polysaccharide-23 09/17/2011  . Td 07/05/2003  . Zoster 06/15/2013  . Zoster Recombinat (Shingrix) 11/30/2017, 02/02/2018     Objective: Vital Signs: BP (!) 141/85 (BP Location: Left Arm, Patient Position: Sitting)   Pulse 82   Resp 18   Ht 5\' 9"  (1.753 m)   Wt 202 lb 6.4 oz (91.8 kg)   BMI 29.89 kg/m    Physical Exam Vitals signs and nursing note reviewed.  Constitutional:      Appearance: She is well-developed.  HENT:     Head: Normocephalic and atraumatic.  Eyes:     Conjunctiva/sclera: Conjunctivae normal.  Neck:     Musculoskeletal: Normal range of motion.  Cardiovascular:     Rate and Rhythm: Normal rate and regular rhythm.     Heart sounds: Normal heart sounds.  Pulmonary:     Effort: Pulmonary effort is normal.     Breath sounds:  Normal breath sounds.  Abdominal:     General: Bowel sounds are normal.     Palpations: Abdomen is soft.  Lymphadenopathy:     Cervical: No cervical adenopathy.  Skin:    General: Skin is warm and dry.     Capillary Refill: Capillary refill takes less than 2 seconds.  Neurological:     Mental Status: She is alert and oriented to person, place, and time.  Psychiatric:        Behavior: Behavior normal.      Musculoskeletal Exam: C-spine, thoracic spine, lumbar spine good range of motion.  No midline spinal tenderness.  No SI joint tenderness.  Shoulder joints, elbow joints, wrist joints, MCPs, PIPs and DIPs good range of motion no synovitis.  She has complete fist formation bilaterally.   Hip joints, knee joints, ankle joints, MTPs, PIPs, DIPs good range of motion no synovitis.  No warmth or effusion bilateral knee joints.  No tenderness or swelling of ankle joints.  No tenderness over trochanteric bursa bilaterally.  She has no difficulty getting up from a seated position.Full strength of  upper and lower extremities on exam.   CDAI Exam: CDAI Score: - Patient Global: -; Provider Global: - Swollen: -; Tender: - Joint Exam   No joint exam has been documented for this visit   There is currently no information documented on the homunculus. Go to the Rheumatology activity and complete the homunculus joint exam.  Investigation: No additional findings.  Imaging: No results found.  Recent Labs: Lab Results  Component Value Date   WBC 5.9 06/07/2018   HGB 12.8 06/07/2018   PLT 218.0 06/07/2018   NA 139 06/07/2018   K 3.7 06/07/2018   CL 103 06/07/2018   CO2 29 06/07/2018   GLUCOSE 100 (H) 06/07/2018   BUN 10 06/07/2018   CREATININE 0.77 06/07/2018   BILITOT 0.5 06/07/2018   ALKPHOS 73 06/07/2018   AST 20 06/07/2018   ALT 18 06/07/2018   PROT 8.0 06/07/2018   ALBUMIN 4.1 06/07/2018   CALCIUM 9.4 06/07/2018   GFRAA 71 06/02/2017    Speciality Comments: PLQ EYe Exam:  01/06/18 WNL @ [facility] up in 1 year  Procedures:  No procedures performed Allergies: Levofloxacin, Nitrofurantoin, Penicillins, Sulfonamide derivatives, and Pantoprazole   Assessment / Plan:     Visit Diagnoses: PMR (polymyalgia rheumatica) ([place]) - She is not experiencing any signs or symptoms of a PMR flare.  Sed rate is chronically elevated but stable.  Sed rate was 41 on 06/29/2018.  She has no generalized muscle weakness or tenderness at this time. She has no difficulty getting up from a seated position or raising her arms above her head.  She is taking Plaquenil 200 mg 1 tablet BID.  She was advised to notify us if she develops any new or worsening symptoms.   Autoimmune disease ([place]) -  ANA+, sicca symptoms: She has no other clinical features of autoimmune disease at this time.  Double-stranded DNA was negative and complements were within normal limits on 06/29/2018.  Sed rate is chronically elevated but stable.  Sed rate was 41 on 06/29/2018.  She was advised to notify us if she develops any new or worsening symptoms.    High risk medication use -  Plaquenil 200 mg 1 tablet by mouth twice daily.  Last Plaquenil eye exam normal on 01/06/2018.  CBC and CMP were drawn on 06/07/2018.  Sicca syndrome ([place]) - She has chronic sicca symptoms.  She will continue taking Plaquenil 200 mg 1 tablet BID.   Trochanteric bursitis of both hips - Resolved   Ischial bursitis of left side - She has intermittent tenderness and discomfort.   Gait instability: She has been experiencing gait instability and lower extremity muscle weakness.  She is no difficulty getting up from a chair during the exam today.  She feels as though her balance has been worsening.  She denied any recent falls.  We discussed the importance of lower extremity muscle strengthening and fall prevention.  She was given a prescription for physical therapy.  Osteopenia of multiple sites: She is taking a calcium and vitamin D  supplement.   Other medical conditions are listed as follows:   History of peripheral neuropathy   Other fatigue - Chronic and related to insomnia.    History of gastroesophageal reflux (GERD)   History of hyperlipidemia   S/P right mastectomy  History of cancer of right breast  Orders: No orders of the defined types were placed in this encounter.  No orders of the defined types were placed in this encounter.  Face-to-face time spent with patient was 30 minutes. Greater than 50% of time was spent in counseling and coordination of care.  Follow-Up Instructions: No follow-ups on file.   Ofilia Neas, PA-C  I examined and evaluated the patient with Hazel Sams PA.  Patient continues to have some generalized pain and discomfort.  She was having left-sided discomfort.  I did not see any synovitis or swelling on examination today.  She states the pain improved after she took Tylenol.  She also complains of poor balance and decreased muscle strength in her lower extremities.  She had no difficulty getting up from the chair.  I referred her to physical therapy for lower extremity muscle strengthening and balance training.  A prescription was given.  The plan of care was discussed as noted above.  Bo Merino, MD  Note - This record has been created using Editor, commissioning.  Chart creation errors have been sought, but may not always  have been located. Such creation errors do not reflect on  the standard of medical care.

## 2018-08-10 ENCOUNTER — Telehealth: Payer: Self-pay | Admitting: Rheumatology

## 2018-08-10 NOTE — Telephone Encounter (Signed)
Patient states on her AVS that had a statement saying 2 medications on her list may be the same and she needs to discuss with healthcare provider. Reviewed the after visit summary with patient and advised her the only thing that is the same is the test strips for er glucose meter. Patient verbalized understanding. Patient advised to contact the office if she has any further questions or concerns.

## 2018-08-10 NOTE — Telephone Encounter (Signed)
Patient states on her AVS it states she is taking two of the same kind of medication. Patient would like for you to point out the two medications to her. Please call to advise.

## 2018-08-24 DIAGNOSIS — H905 Unspecified sensorineural hearing loss: Secondary | ICD-10-CM | POA: Diagnosis not present

## 2018-09-14 DIAGNOSIS — H905 Unspecified sensorineural hearing loss: Secondary | ICD-10-CM | POA: Diagnosis not present

## 2018-09-27 ENCOUNTER — Other Ambulatory Visit: Payer: Self-pay | Admitting: Internal Medicine

## 2018-09-27 ENCOUNTER — Telehealth: Payer: Self-pay | Admitting: Rheumatology

## 2018-09-27 DIAGNOSIS — M353 Polymyalgia rheumatica: Secondary | ICD-10-CM

## 2018-09-27 DIAGNOSIS — Z79899 Other long term (current) drug therapy: Secondary | ICD-10-CM

## 2018-09-27 NOTE — Addendum Note (Signed)
Addended by: Carole Binning on: 09/27/2018 01:31 PM   Modules accepted: Orders

## 2018-09-27 NOTE — Telephone Encounter (Signed)
Patient called requesting a return call to let her know when she is due to have her SED rate checked.

## 2018-09-27 NOTE — Telephone Encounter (Signed)
Per Dr. Estanislado Pandy can repeat sed rate with standing orders in October 2020.  Patient advised and will come on October to update labs.

## 2018-10-20 DIAGNOSIS — B351 Tinea unguium: Secondary | ICD-10-CM | POA: Diagnosis not present

## 2018-10-20 DIAGNOSIS — L84 Corns and callosities: Secondary | ICD-10-CM | POA: Diagnosis not present

## 2018-10-20 DIAGNOSIS — E1142 Type 2 diabetes mellitus with diabetic polyneuropathy: Secondary | ICD-10-CM | POA: Diagnosis not present

## 2018-10-20 DIAGNOSIS — M79676 Pain in unspecified toe(s): Secondary | ICD-10-CM | POA: Diagnosis not present

## 2018-11-04 ENCOUNTER — Telehealth: Payer: Self-pay | Admitting: Rheumatology

## 2018-11-04 DIAGNOSIS — L659 Nonscarring hair loss, unspecified: Secondary | ICD-10-CM

## 2018-11-04 NOTE — Telephone Encounter (Signed)
Patient advised it is normal to lose 100 hair per day. If she is losing more hair there she may want to see a dermatologist. Patient advised she should also take a multivitamin and folic acid on a regular basis. Patient states she feels like she is losing more than 100 hair per day. Patient is requesting referral to dermatologist. Per Dr. Estanislado Pandy okay to place referral.

## 2018-11-04 NOTE — Telephone Encounter (Signed)
It is normal to lose 100 hair per day.  If she is losing more hair there she may want to see a dermatologist.  She should also take a multivitamin and folic acid on a regular basis.

## 2018-11-04 NOTE — Telephone Encounter (Signed)
Patient states she has been experiencing her hair falling out. Patient states she has not changed the products she is using on her hair. Patient states she has not started any medications. Patient would like recommendations on what she may be able to do.

## 2018-11-04 NOTE — Telephone Encounter (Signed)
Patient called stating the last 2-3 months she has noticed her hair is falling out.  Patient is requesting a return call today if possible to see if there is something Dr. Estanislado Pandy would recommend.

## 2018-11-06 ENCOUNTER — Other Ambulatory Visit: Payer: Self-pay | Admitting: Internal Medicine

## 2018-11-07 ENCOUNTER — Other Ambulatory Visit: Payer: Self-pay | Admitting: Internal Medicine

## 2018-11-07 ENCOUNTER — Other Ambulatory Visit: Payer: Self-pay

## 2018-11-07 DIAGNOSIS — M353 Polymyalgia rheumatica: Secondary | ICD-10-CM

## 2018-11-07 DIAGNOSIS — Z79899 Other long term (current) drug therapy: Secondary | ICD-10-CM

## 2018-11-08 LAB — COMPLETE METABOLIC PANEL WITH GFR
AG Ratio: 1.1 (calc) (ref 1.0–2.5)
ALT: 18 U/L (ref 6–29)
AST: 21 U/L (ref 10–35)
Albumin: 3.9 g/dL (ref 3.6–5.1)
Alkaline phosphatase (APISO): 74 U/L (ref 37–153)
BUN/Creatinine Ratio: 8 (calc) (ref 6–22)
BUN: 8 mg/dL (ref 7–25)
CO2: 28 mmol/L (ref 20–32)
Calcium: 9.6 mg/dL (ref 8.6–10.4)
Chloride: 106 mmol/L (ref 98–110)
Creat: 0.96 mg/dL — ABNORMAL HIGH (ref 0.60–0.93)
GFR, Est African American: 68 mL/min/{1.73_m2} (ref >=60)
GFR, Est Non African American: 59 mL/min/{1.73_m2} — ABNORMAL LOW (ref >=60)
Globulin: 3.4 g/dL (calc) (ref 1.9–3.7)
Glucose, Bld: 81 mg/dL (ref 65–99)
Potassium: 4.4 mmol/L (ref 3.5–5.3)
Sodium: 143 mmol/L (ref 135–146)
Total Bilirubin: 0.4 mg/dL (ref 0.2–1.2)
Total Protein: 7.3 g/dL (ref 6.1–8.1)

## 2018-11-08 LAB — CBC WITH DIFFERENTIAL/PLATELET
Absolute Monocytes: 311 cells/uL (ref 200–950)
Basophils Absolute: 19 cells/uL (ref 0–200)
Basophils Relative: 0.5 %
Eosinophils Absolute: 41 cells/uL (ref 15–500)
Eosinophils Relative: 1.1 %
HCT: 36.7 % (ref 35.0–45.0)
Hemoglobin: 12.2 g/dL (ref 11.7–15.5)
Lymphs Abs: 1180 cells/uL (ref 850–3900)
MCH: 29.6 pg (ref 27.0–33.0)
MCHC: 33.2 g/dL (ref 32.0–36.0)
MCV: 89.1 fL (ref 80.0–100.0)
MPV: 11.5 fL (ref 7.5–12.5)
Monocytes Relative: 8.4 %
Neutro Abs: 2150 cells/uL (ref 1500–7800)
Neutrophils Relative %: 58.1 %
Platelets: 238 10*3/uL (ref 140–400)
RBC: 4.12 10*6/uL (ref 3.80–5.10)
RDW: 13.1 % (ref 11.0–15.0)
Total Lymphocyte: 31.9 %
WBC: 3.7 10*3/uL — ABNORMAL LOW (ref 3.8–10.8)

## 2018-11-08 LAB — SEDIMENTATION RATE: Sed Rate: 46 mm/h — ABNORMAL HIGH (ref 0–30)

## 2018-11-08 NOTE — Progress Notes (Signed)
Labs are stable.  White cell count is mildly decreased.  We will continue to monitor.

## 2018-11-15 NOTE — Progress Notes (Signed)
Office Visit Note  Patient: Angela Simmons             Date of Birth: 05-24-1945           MRN: [id number]             PCP: Binnie Rail, MD Referring: Binnie Rail, MD Visit Date: 11/29/2018 Occupation: @GUAROCC @  Subjective:  Poor balance.  History of Present Illness: Angela Simmons is a 73 y.o. female with history of PMR and autoimmune disease. She is taking Plaquenil 200 mg 1 tablet BID.  She states she has been having some discomfort in her left arm.  She denies any joint swelling.  She states she ate some bacon last night and woke up with some headache this morning.  Her hands are stiff.  She is also noticed a rash on her left lower extremity.  She states her balance is not very good.  She continues to have some sicca symptoms.  Activities of Daily Living:  Patient reports morning stiffness for 15 minutes.   Patient Reports nocturnal pain.  Difficulty dressing/grooming: Denies Difficulty climbing stairs: Reports Difficulty getting out of chair: Reports Difficulty using hands for taps, buttons, cutlery, and/or writing: Reports  Review of Systems  Constitutional: Positive for fatigue. Negative for night sweats, weight gain and weight loss.  HENT: Positive for mouth dryness. Negative for mouth sores, trouble swallowing, trouble swallowing and nose dryness.   Eyes: Positive for dryness. Negative for pain, redness and visual disturbance.  Respiratory: Negative for cough, shortness of breath and difficulty breathing.   Cardiovascular: Positive for swelling in legs/feet. Negative for chest pain, palpitations, hypertension and irregular heartbeat.  Gastrointestinal: Positive for constipation. Negative for blood in stool and diarrhea.  Endocrine: Negative for cold intolerance and excessive thirst.  Genitourinary: Negative for difficulty urinating and vaginal dryness.  Musculoskeletal: Positive for arthralgias, gait problem, joint pain, muscle weakness, morning stiffness and muscle  tenderness. Negative for joint swelling, myalgias and myalgias.  Skin: Positive for rash. Negative for color change, hair loss, skin tightness, ulcers and sensitivity to sunlight.  Allergic/Immunologic: Negative for susceptible to infections.  Neurological: Positive for dizziness, light-headedness and headaches. Negative for numbness, memory loss, night sweats and weakness.  Hematological: Negative for bruising/bleeding tendency and swollen glands.  Psychiatric/Behavioral: Negative for depressed mood and sleep disturbance. The patient is not nervous/anxious.     PMFS History:  Patient Active Problem List   Diagnosis Date Noted  . Vertigo 06/07/2018  . Urinary frequency 06/07/2018  . Lower abdominal pain 06/07/2018  . Herpes zoster without complication [id number]  . Tingling in extremities 11/26/2017  . Ischial bursitis of left side 06/02/2017  . Sicca syndrome ([place]) 06/23/2016  . Bilateral leg edema 04/07/2016  . Burning sensation of skin 04/07/2016  . PMR (polymyalgia rheumatica) (HCC) 03/27/2016  . Osteopenia 03/04/2016  . Autoimmune disease ([place]) 03/18/2015  . Inflammatory arthritis 03/18/2015  . Joint pain 11/22/2014  . Overweight (BMI 25.0-29.9) 08/18/2014  . Low back pain with radiation 08/18/2014  . Hyperlipidemia 06/28/2014  . Chest pain, atypical 04/11/2014  . Tendonitis of shoulder 08/29/2013  . S/P right mastectomy 11/14/2012  . Type 2 diabetes, controlled, with neuropathy ([place]) 10/05/2012  . Allergic rhinitis 01/26/2011  . GERD 11/10/2009  . INTERSTITIAL CYSTITIS 10/31/2008  . BENIGN NEOPLASM OF VULVA 02/01/2008  . Essential hypertension, benign 02/08/2007  . History of cancer of right breast 02/08/2007    Past Medical History:  Diagnosis Date  . Breast cancer ([place])  1990s   right breast  . Diabetes mellitus without complication ([place])   . GERD (gastroesophageal reflux disease)   . HTN (hypertension)   . Hypercholesterolemia   . Obesity     Family History   Problem Relation Age of Onset  . Diabetes Mother        diabetic coma  . Heart failure Mother   . Diabetes Father   . Emphysema Father   . Diabetes Sister   . Hypercholesterolemia Sister   . Hypercholesterolemia Brother   . Diabetes Brother   . Hypertension Brother   . Hypercholesterolemia Brother   . Diabetes Sister   . Hypertension Sister   . Hypercholesterolemia Sister   . Hypercholesterolemia Sister   . Hypercholesterolemia Sister   . Diabetes Brother   . Hypertension Brother   . Colon cancer Neg Hx    Past Surgical History:  Procedure Laterality Date  . ABDOM[place]CT[place]Y  [place]  . COLONOSCOPY  May 2003   Dr. Tamala Julian: normal  . COLONOSCOPY  06/22/2011   Procedure: COLONOSCOPY;  Surgeon: Danie Binder, MD;  [facility];  Service: Endoscopy;  Laterality: N/A;  10:30  . MASTECTOMY  1995   right    Social History   Social History Narrative   Lives at home with husband   Caffeine, maybe 1 a day   Immunization History  Administered Date(s) Administered  . Influenza Split 11/14/2010  . Influenza Whole 10/18/2006, 10/31/2008, 11/06/2009  . Influenza, High Dose Seasonal PF 11/22/2014, 02/21/2016, 12/18/2016, 11/05/2017  . Influenza,inj,Quad PF,6+ Mos 11/14/2012, 11/23/2013  . Pneumococcal Conjugate-13 04/11/2014  . Pneumococcal Polysaccharide-23 09/17/2011  . Td 07/05/2003  . Zoster 06/15/2013  . Zoster Recombinat (Shingrix) 11/30/2017, 02/02/2018     Objective: Vital Signs: BP 133/80 (BP Location: Left Arm, Patient Position: Sitting, Cuff Size: Normal)   Pulse 77   Resp 16   Ht 5' 9.5" (1.765 m)   Wt 199 lb 12.8 oz (90.6 kg)   BMI 29.08 kg/m    Physical Exam Vitals signs and nursing note reviewed.  Constitutional:      Appearance: She is well-developed.  HENT:     Head: Normocephalic and atraumatic.  Eyes:     Conjunctiva/sclera: Conjunctivae normal.  Neck:     Musculoskeletal: Normal range of motion.  Cardiovascular:      Rate and Rhythm: Normal rate and regular rhythm.     Heart sounds: Normal heart sounds.  Pulmonary:     Effort: Pulmonary effort is normal.     Breath sounds: Normal breath sounds.  Abdominal:     General: Bowel sounds are normal.     Palpations: Abdomen is soft.  Lymphadenopathy:     Cervical: No cervical adenopathy.  Skin:    General: Skin is warm and dry.     Capillary Refill: Capillary refill takes less than 2 seconds.  Neurological:     Mental Status: She is alert and oriented to person, place, and time.  Psychiatric:        Behavior: Behavior normal.      Musculoskeletal Exam: C-spine was in good range of motion.  She has good range of motion of her shoulder joints elbow joints wrist joints.  She has some DIP and PIP thickening in her hands.  No synovitis was noted.  Hip joints and knee joints were in good range of motion.  She had no difficulty getting up the chair.  She had no temporal artery tenderness.  CDAI Exam: CDAI Score: - Patient Global: -; Provider Global: - Swollen: -; Tender: - Joint Exam   No joint exam has been documented for this visit   There is currently no information documented on the homunculus. Go to the Rheumatology activity and complete the homunculus joint exam.  Investigation: No additional findings.  Imaging: No results found.  Recent Labs: Lab Results  Component Value Date   WBC 3.7 (L) 11/07/2018   HGB 12.2 11/07/2018   PLT 238 11/07/2018   NA 143 11/07/2018   K 4.4 11/07/2018   CL 106 11/07/2018   CO2 28 11/07/2018   GLUCOSE 81 11/07/2018   BUN 8 11/07/2018   CREATININE 0.96 (H) 11/07/2018   BILITOT 0.4 11/07/2018   ALKPHOS 73 06/07/2018   AST 21 11/07/2018   ALT 18 11/07/2018   PROT 7.3 11/07/2018   ALBUMIN 4.1 06/07/2018   CALCIUM 9.6 11/07/2018   GFRAA 68 11/07/2018    Speciality Comments: PLQ EYe Exam: 01/06/18 WNL @ [facility] [place] up in 1 year  Procedures:  No procedures performed Allergies:  Levofloxacin, Nitrofurantoin, Penicillins, Sulfonamide derivatives, and Pantoprazole   Assessment / Plan:     Visit Diagnoses: PMR (polymyalgia rheumatica) (HCC)-appears to be in remission with no increased muscle weakness or tenderness.  She has chronic elevation of sedimentation rate.  Autoimmune disease ([place]) - ANA+, sicca symptoms.  Over-the-counter products were discussed.  High risk medication use - Plaquenil 200 mg 1 tablet by mouth twice daily. Eye Exam: 01/06/18.  Patient is advised to get repeat eye examination.  Her labs are stable.  Sicca syndrome (HCC)-over-the-counter products were discussed.  Hair loss-she has been advised to take multivitamin.  Osteopenia of multiple sites-she is on calcium and vitamin D.  Imbalance-patient is complaining of imbalance.  I offered referral to neurology.  She states she has appointment coming up with her PCP and will discuss with her.  Other medical problems are listed as follows:  Other fatigue  History of gastroesophageal reflux (GERD)  History of peripheral neuropathy  S/P right mastectomy  History of cancer of right breast  History of hyperlipidemia  Orders: No orders of the defined types were placed in this encounter.  No orders of the defined types were placed in this encounter.     Follow-Up Instructions: Return in about 5 months (around 04/29/2019) for Polymyalgia rheumatica, Osteoarthritis.   Bo Merino, MD  Note - This record has been created using Editor, commissioning.  Chart creation errors have been sought, but may not always  have been located. Such creation errors do not reflect on  the standard of medical care.

## 2018-11-25 DIAGNOSIS — H1045 Other chronic allergic conjunctivitis: Secondary | ICD-10-CM | POA: Diagnosis not present

## 2018-11-29 ENCOUNTER — Ambulatory Visit (INDEPENDENT_AMBULATORY_CARE_PROVIDER_SITE_OTHER): Payer: PPO | Admitting: Rheumatology

## 2018-11-29 ENCOUNTER — Encounter: Payer: Self-pay | Admitting: Physician Assistant

## 2018-11-29 ENCOUNTER — Other Ambulatory Visit: Payer: Self-pay

## 2018-11-29 VITALS — BP 133/80 | HR 77 | Resp 16 | Ht 69.5 in | Wt 199.8 lb

## 2018-11-29 DIAGNOSIS — Z853 Personal history of malignant neoplasm of breast: Secondary | ICD-10-CM | POA: Diagnosis not present

## 2018-11-29 DIAGNOSIS — M359 Systemic involvement of connective tissue, unspecified: Secondary | ICD-10-CM

## 2018-11-29 DIAGNOSIS — Z9011 Acquired absence of right breast and nipple: Secondary | ICD-10-CM | POA: Diagnosis not present

## 2018-11-29 DIAGNOSIS — Z8639 Personal history of other endocrine, nutritional and metabolic disease: Secondary | ICD-10-CM

## 2018-11-29 DIAGNOSIS — M3500 Sicca syndrome, unspecified: Secondary | ICD-10-CM | POA: Diagnosis not present

## 2018-11-29 DIAGNOSIS — Z8669 Personal history of other diseases of the nervous system and sense organs: Secondary | ICD-10-CM | POA: Diagnosis not present

## 2018-11-29 DIAGNOSIS — Z79899 Other long term (current) drug therapy: Secondary | ICD-10-CM | POA: Diagnosis not present

## 2018-11-29 DIAGNOSIS — M8589 Other specified disorders of bone density and structure, multiple sites: Secondary | ICD-10-CM

## 2018-11-29 DIAGNOSIS — L659 Nonscarring hair loss, unspecified: Secondary | ICD-10-CM | POA: Diagnosis not present

## 2018-11-29 DIAGNOSIS — R5383 Other fatigue: Secondary | ICD-10-CM | POA: Diagnosis not present

## 2018-11-29 DIAGNOSIS — R2689 Other abnormalities of gait and mobility: Secondary | ICD-10-CM

## 2018-11-29 DIAGNOSIS — M353 Polymyalgia rheumatica: Secondary | ICD-10-CM | POA: Diagnosis not present

## 2018-11-29 DIAGNOSIS — Z8719 Personal history of other diseases of the digestive system: Secondary | ICD-10-CM

## 2018-12-06 ENCOUNTER — Other Ambulatory Visit (INDEPENDENT_AMBULATORY_CARE_PROVIDER_SITE_OTHER): Payer: PPO

## 2018-12-06 ENCOUNTER — Other Ambulatory Visit: Payer: Self-pay | Admitting: Rheumatology

## 2018-12-06 ENCOUNTER — Other Ambulatory Visit: Payer: Self-pay

## 2018-12-06 ENCOUNTER — Ambulatory Visit (INDEPENDENT_AMBULATORY_CARE_PROVIDER_SITE_OTHER): Payer: PPO | Admitting: Internal Medicine

## 2018-12-06 ENCOUNTER — Encounter: Payer: Self-pay | Admitting: Internal Medicine

## 2018-12-06 ENCOUNTER — Other Ambulatory Visit: Payer: Self-pay | Admitting: Internal Medicine

## 2018-12-06 VITALS — BP 146/82 | HR 73 | Temp 97.8°F | Resp 16 | Ht 69.5 in | Wt 199.4 lb

## 2018-12-06 DIAGNOSIS — E7849 Other hyperlipidemia: Secondary | ICD-10-CM | POA: Diagnosis not present

## 2018-12-06 DIAGNOSIS — I10 Essential (primary) hypertension: Secondary | ICD-10-CM | POA: Diagnosis not present

## 2018-12-06 DIAGNOSIS — R2689 Other abnormalities of gait and mobility: Secondary | ICD-10-CM | POA: Insufficient documentation

## 2018-12-06 DIAGNOSIS — R519 Headache, unspecified: Secondary | ICD-10-CM | POA: Diagnosis not present

## 2018-12-06 DIAGNOSIS — K219 Gastro-esophageal reflux disease without esophagitis: Secondary | ICD-10-CM | POA: Diagnosis not present

## 2018-12-06 DIAGNOSIS — M1990 Unspecified osteoarthritis, unspecified site: Secondary | ICD-10-CM | POA: Diagnosis not present

## 2018-12-06 DIAGNOSIS — E1140 Type 2 diabetes mellitus with diabetic neuropathy, unspecified: Secondary | ICD-10-CM

## 2018-12-06 DIAGNOSIS — Z23 Encounter for immunization: Secondary | ICD-10-CM

## 2018-12-06 LAB — COMPREHENSIVE METABOLIC PANEL
ALT: 26 U/L (ref 0–35)
AST: 19 U/L (ref 0–37)
Albumin: 4.1 g/dL (ref 3.5–5.2)
Alkaline Phosphatase: 76 U/L (ref 39–117)
BUN: 13 mg/dL (ref 6–23)
CO2: 28 mEq/L (ref 19–32)
Calcium: 9.3 mg/dL (ref 8.4–10.5)
Chloride: 105 mEq/L (ref 96–112)
Creatinine, Ser: 0.87 mg/dL (ref 0.40–1.20)
GFR: 77.33 mL/min (ref >60.00)
Glucose, Bld: 96 mg/dL (ref 70–99)
Potassium: 3.9 mEq/L (ref 3.5–5.1)
Sodium: 141 mEq/L (ref 135–145)
Total Bilirubin: 0.4 mg/dL (ref 0.2–1.2)
Total Protein: 7.6 g/dL (ref 6.0–8.3)

## 2018-12-06 LAB — LIPID PANEL
Cholesterol: 143 mg/dL (ref 0–200)
HDL: 47.2 mg/dL (ref >39.00)
LDL Cholesterol: 84 mg/dL (ref 0–99)
NonHDL: 95.82
Total CHOL/HDL Ratio: 3
Triglycerides: 59 mg/dL (ref 0.0–149.0)
VLDL: 11.8 mg/dL (ref 0.0–40.0)

## 2018-12-06 LAB — HEMOGLOBIN A1C: Hgb A1c MFr Bld: 6.1 % (ref 4.6–6.5)

## 2018-12-06 NOTE — Progress Notes (Signed)
Subjective:    Patient ID: Angela Simmons, female    DOB: 1945/12/01, 73 y.o.   MRN: [id number]  HPI The patient is here for follow up.  She is exercising regularly - a little walking.    Hypertension: She is taking her medication daily. She is compliant with a low sodium diet.  She denies chest pain. She does monitor her blood pressure at home - 133/80, 140/80, 118/79, 140/81, 139/83.    Diabetes: She is controlling her sugars with diet. She is compliant with a diabetic diet.   Her sugars have been 73-103.   Hyperlipidemia: She is taking her medication daily. She is compliant with a low fat/cholesterol diet. She denies myalgias.   GERD:  She is taking her medication daily as prescribed.  She denies any GERD symptoms and feels her GERD is well controlled.   Poor balance:  Her bilateral lower legs hurt and sometimes it is hard for her to stand up.  When she walking she feels like she will fall to one side or the other.  She has not fallen.  She occasionally feels lightheadedness/dizziness which contributes to her feeling off balanced at times.  She did discuss this with her rheumatologist and the referral to neurology was advised, but she did not want to do that at this time and does not want to do it now because of the Covid pandemic.  She is walking some for exercise.  Frequent headaches: She is having frequent headaches mostly in her frontal region.  She estimates it may occur about 3 times a week and often when she wakes up in the morning.  She thinks she may snore.  She denies feeling tired when she wakes up or feeling tired throughout the day.     Medications and allergies reviewed with patient and updated if appropriate.  Patient Active Problem List   Diagnosis Date Noted  . Vertigo 06/07/2018  . Urinary frequency 06/07/2018  . Lower abdominal pain 06/07/2018  . Herpes zoster without complication 10/11/3005  . Tingling in extremities 11/26/2017  . Ischial bursitis of left  side 06/02/2017  . Sicca syndrome ([place]) 06/23/2016  . Bilateral leg edema 04/07/2016  . Burning sensation of skin 04/07/2016  . PMR (polymyalgia rheumatica) (HCC) 03/27/2016  . Osteopenia 03/04/2016  . Autoimmune disease ([place]) 03/18/2015  . Inflammatory arthritis 03/18/2015  . Joint pain 11/22/2014  . Overweight (BMI 25.0-29.9) 08/18/2014  . Low back pain with radiation 08/18/2014  . Hyperlipidemia 06/28/2014  . Chest pain, atypical 04/11/2014  . Tendonitis of shoulder 08/29/2013  . S/P right mastectomy 11/14/2012  . Type 2 diabetes, controlled, with neuropathy ([place]) 10/05/2012  . Allergic rhinitis 01/26/2011  . GERD 11/10/2009  . INTERSTITIAL CYSTITIS 10/31/2008  . BENIGN NEOPLASM OF VULVA 02/01/2008  . Essential hypertension, benign 02/08/2007  . History of cancer of right breast 02/08/2007    Current Outpatient Medications on File Prior to Visit  Medication Sig Dispense Refill  . amLODipine (NORVASC) 5 MG tablet TAKE 1 TABLET BY MOUTH EVERY DAY 90 tablet 0  . aspirin (ADULT ASPIRIN EC LOW STRENGTH) 81 MG EC tablet Take 81 mg by mouth daily.     Marland Kitchen atorvastatin (LIPITOR) 10 MG tablet TAKE 1 TABLET BY MOUTH DAILY 90 tablet 3  . benazepril (LOTENSIN) 40 MG tablet TAKE 1 TABLET BY MOUTH EVERY DAY 90 tablet 0  . Blood Glucose Monitoring Suppl (ONETOUCH VERIO) w/Device KIT USE TO TEST BLOOD SUGAR ONCE DAILY AS DIRECTED 1 kit  0  . calcium-vitamin D (OSCAL WITH D) 500-200 MG-UNIT tablet Take 1 tablet by mouth daily with breakfast.    . diclofenac sodium (VOLTAREN) 1 % GEL Apply 3 g to 3 large joints up to 3 times daily. 3 Tube 3  . fluticasone (FLONASE) 50 MCG/ACT nasal spray USE 2 SPRAYS IN EACH NOSTRIL EVERY DAY 48 g 0  . folic acid (FOLVITE) 322 MCG tablet Take 400 mcg by mouth daily.    Marland Kitchen glucose blood test strip USE TO TEST BLOOD SUGAR ONCE DAILY. E11.40 100 each 3  . loratadine (CLARITIN) 10 MG tablet TAKE 1 TABLET BY MOUTH EVERY DAY (Patient taking differently: TAKE 1 TABLET BY  MOUTH EVERY DAY PRN) 30 tablet 2  . Multiple Vitamin (MULTIVITAMIN) tablet Take 1 tablet by mouth daily.    . Omega-3 Fatty Acids (FISH OIL) 1000 MG CAPS Take 1 capsule by mouth daily.     Glory Rosebush DELICA LANCETS 02R MISC USE TO TEST ONCE DAILY 100 each 2  . ONETOUCH VERIO test strip USE TO TEST BLOOD SUGAR ONCE DAILY 100 each 0  . triamcinolone ointment (KENALOG) 0.5 % Apply 1 application topically 2 (two) times daily. Do not use for more than 14 days in a row 15 g 0  . UNABLE TO FIND Over the counter Healthy Feet & Nerves     No current facility-administered medications on file prior to visit.     Past Medical History:  Diagnosis Date  . Breast cancer ([place]) 1990s   right breast  . Diabetes mellitus without complication ([place])   . GERD (gastroesophageal reflux disease)   . HTN (hypertension)   . Hypercholesterolemia   . Obesity     Past Surgical History:  Procedure Laterality Date  . ABDOMINAL HY[place]CTOMY  [place]   [place], [place]ids  . COLONOSCOPY  May 2003   Dr. Tamala Julian: normal  . COLONOSCOPY  06/22/2011   Procedure: COLONOSCOPY;  Surgeon: Danie Binder, MD;  [facility];  Service: Endoscopy;  Laterality: N/A;  10:30  . MASTECTOMY  1995   right     Social History   Socioeconomic History  . Marital status: Married    Spouse name: Not on file  . Number of children: 3  . Years of education: 7  . Highest education level: Not on file  Occupational History  . Not on file  Social Needs  . Financial resource strain: Not on file  . Food insecurity    Worry: Not on file    Inability: Not on file  . Transportation needs    Medical: Not on file    Non-medical: Not on file  Tobacco Use  . Smoking status: Never Smoker  . Smokeless tobacco: Never Used  Substance and Sexual Activity  . Alcohol use: No  . Drug use: Never  . Sexual activity: Never    Birth control/protection: Rhythm  Lifestyle  . Physical activity    Days per week: Not on file    Minutes per  session: Not on file  . Stress: Not on file  Relationships  . Social Herbalist on phone: Not on file    Gets together: Not on file    Attends religious service: Not on file    Active member of club or organization: Not on file    Attends meetings of clubs or organizations: Not on file    Relationship status: Not on file  Other Topics Concern  . Not on  file  Social History Narrative   Lives at home with husband   Caffeine, maybe 1 a day    Family History  Problem Relation Age of Onset  . Diabetes Mother        diabetic coma  . Heart failure Mother   . Diabetes Father   . Emphysema Father   . Diabetes Sister   . Hypercholesterolemia Sister   . Hypercholesterolemia Brother   . Diabetes Brother   . Hypertension Brother   . Hypercholesterolemia Brother   . Diabetes Sister   . Hypertension Sister   . Hypercholesterolemia Sister   . Hypercholesterolemia Sister   . Hypercholesterolemia Sister   . Diabetes Brother   . Hypertension Brother   . Colon cancer Neg Hx     Review of Systems  Constitutional: Negative for chills and fever.  Respiratory: Positive for shortness of breath (with walking). Negative for cough and wheezing.   Cardiovascular: Positive for palpitations (with walking too far) and leg swelling (mild). Negative for chest pain.  Musculoskeletal: Positive for gait problem.  Neurological: Positive for dizziness, light-headedness and headaches (frontal in morning).       Objective:   Vitals:   12/06/18 1059  BP: (!) 146/82  Pulse: 73  Resp: 16  Temp: 97.8 F (36.6 C)  SpO2: 99%   BP Readings from Last 3 Encounters:  12/06/18 (!) 146/82  11/29/18 133/80  08/08/18 (!) 141/85   Wt Readings from Last 3 Encounters:  12/06/18 199 lb 6.4 oz (90.4 kg)  11/29/18 199 lb 12.8 oz (90.6 kg)  08/08/18 202 lb 6.4 oz (91.8 kg)   Body mass index is 29.02 kg/m.   Physical Exam    Constitutional: Appears well-developed and well-nourished. No  distress.  HENT:  Head: Normocephalic and atraumatic.  Neck: Neck supple. No tracheal deviation present. No thyromegaly present.  No cervical lymphadenopathy Cardiovascular: Normal rate, regular rhythm and normal heart sounds.   No murmur heard. No carotid bruit .  No edema Pulmonary/Chest: Effort normal and breath sounds normal. No respiratory distress. No has no wheezes. No rales.  Skin: Skin is warm and dry. Not diaphoretic.  Psychiatric: Normal mood and affect. Behavior is normal.      Assessment & Plan:    See Problem List for Assessment and Plan of chronic medical problems.

## 2018-12-06 NOTE — Assessment & Plan Note (Signed)
Diet controlled Sugars well controlled at home Check A1c Continue regular exercise and diabetic diet

## 2018-12-06 NOTE — Telephone Encounter (Signed)
Last Visit: 11/29/18 Next Visit: 05/01/19 Labs: 11/07/18 stable White cell count is mildly decreased.  PLQ Eye Exam: 01/06/18 WNL   Okay to refill per Dr. Estanislado Pandy

## 2018-12-06 NOTE — Assessment & Plan Note (Signed)
Experiencing frequent headaches-typically frontal in about 3 times a week She typically has them in the morning and then they go away Takes Tylenol as needed-advised not overdoing Tylenol because of the risk of rebound headaches May snore, but denies fatigue upon wakening or nonrefreshing sleep Deferred neurological evaluation Discussed some of the most common causes of headaches She will monitor for now

## 2018-12-06 NOTE — Assessment & Plan Note (Signed)
Blood pressure overall controlled at home Continue current medications at current doses CMP

## 2018-12-06 NOTE — Assessment & Plan Note (Signed)
GERD controlled Continue daily medication  

## 2018-12-06 NOTE — Assessment & Plan Note (Signed)
She has poor balance Unclear etiology-she does have some dizziness/lightheadedness, but also has some bilateral lower leg pain She is walking some for exercise-encouraged increasing her walking Advised balance exercises at home Deferred neurological evaluation and physical therapy due to Covid

## 2018-12-06 NOTE — Assessment & Plan Note (Signed)
Check lipid panel, CMP Continue atorvastatin Continue regular exercise and healthy diet

## 2018-12-06 NOTE — Patient Instructions (Addendum)
  Tests ordered today. Your results will be released to MyChart (or called to you) after review.  If any changes need to be made, you will be notified at that same time.  Flu immunization administered today.    Medications reviewed and updated.  Changes include :  none      Please followup in 6 months   

## 2018-12-06 NOTE — Assessment & Plan Note (Signed)
Following with rheumatology Currently on Plaquenil

## 2019-01-05 ENCOUNTER — Other Ambulatory Visit: Payer: Self-pay | Admitting: Internal Medicine

## 2019-01-09 DIAGNOSIS — H40013 Open angle with borderline findings, low risk, bilateral: Secondary | ICD-10-CM | POA: Diagnosis not present

## 2019-01-09 DIAGNOSIS — E119 Type 2 diabetes mellitus without complications: Secondary | ICD-10-CM | POA: Diagnosis not present

## 2019-01-09 DIAGNOSIS — Z79899 Other long term (current) drug therapy: Secondary | ICD-10-CM | POA: Diagnosis not present

## 2019-01-09 LAB — HM DIABETES EYE EXAM

## 2019-01-17 ENCOUNTER — Telehealth: Payer: Self-pay

## 2019-01-17 DIAGNOSIS — Z1231 Encounter for screening mammogram for malignant neoplasm of breast: Secondary | ICD-10-CM

## 2019-01-17 NOTE — Addendum Note (Signed)
Addended by: Binnie Rail on: 01/17/2019 07:40 PM   Modules accepted: Orders

## 2019-01-17 NOTE — Telephone Encounter (Signed)
Copied from [place] [id number]. Topic: General - Inquiry >> Jan 16, 2019  8:40 AM Angela Simmons wrote: Reason for CRM: Patient is requesting mammogram orders to be sent to [facility].  Patient states she is due for mammogram in Jan Call back [phone]

## 2019-01-17 NOTE — Telephone Encounter (Signed)
ordered

## 2019-01-19 DIAGNOSIS — E1142 Type 2 diabetes mellitus with diabetic polyneuropathy: Secondary | ICD-10-CM | POA: Diagnosis not present

## 2019-01-19 DIAGNOSIS — B351 Tinea unguium: Secondary | ICD-10-CM | POA: Diagnosis not present

## 2019-01-19 DIAGNOSIS — L84 Corns and callosities: Secondary | ICD-10-CM | POA: Diagnosis not present

## 2019-01-19 DIAGNOSIS — M79676 Pain in unspecified toe(s): Secondary | ICD-10-CM | POA: Diagnosis not present

## 2019-01-29 ENCOUNTER — Encounter: Payer: Self-pay | Admitting: Internal Medicine

## 2019-03-06 ENCOUNTER — Ambulatory Visit (HOSPITAL_COMMUNITY)
Admission: RE | Admit: 2019-03-06 | Discharge: 2019-03-06 | Disposition: A | Discharge status: Home / Self Care | Payer: PPO | Source: Ambulatory Visit | Attending: Internal Medicine | Admitting: Internal Medicine

## 2019-03-06 ENCOUNTER — Other Ambulatory Visit: Payer: Self-pay | Admitting: Internal Medicine

## 2019-03-06 ENCOUNTER — Other Ambulatory Visit: Payer: Self-pay

## 2019-03-06 DIAGNOSIS — Z1231 Encounter for screening mammogram for malignant neoplasm of breast: Secondary | ICD-10-CM

## 2019-03-15 ENCOUNTER — Other Ambulatory Visit: Payer: Self-pay | Admitting: Internal Medicine

## 2019-03-30 DIAGNOSIS — B351 Tinea unguium: Secondary | ICD-10-CM | POA: Diagnosis not present

## 2019-03-30 DIAGNOSIS — E1142 Type 2 diabetes mellitus with diabetic polyneuropathy: Secondary | ICD-10-CM | POA: Diagnosis not present

## 2019-03-30 DIAGNOSIS — L84 Corns and callosities: Secondary | ICD-10-CM | POA: Diagnosis not present

## 2019-03-30 DIAGNOSIS — M79676 Pain in unspecified toe(s): Secondary | ICD-10-CM | POA: Diagnosis not present

## 2019-03-31 ENCOUNTER — Telehealth: Payer: Self-pay | Admitting: Internal Medicine

## 2019-03-31 NOTE — Telephone Encounter (Signed)
Spoke with daughter. She seemed concerned about mothers leg and wanted it looked at sooner than Monday. I advised the only option was to go to UC to have leg looked that. She refused UC at this time and states she will wait until Monday at pts appointment.

## 2019-03-31 NOTE — Telephone Encounter (Signed)
    Daughter [name] calling, seeking advice regarding leg, unwilling to provide additional information . Please call [phone]

## 2019-04-01 NOTE — Progress Notes (Signed)
Subjective:    Patient ID: Angela Simmons, female    DOB: 11-28-1945, 74 y.o.   MRN: [id number]  HPI The patient is here for an acute visit.  She is here with her daughter.   Bruise on both legs: She has a bruise on her left lower leg that has been there for a while, but recently it became more prominent and very painful.  It was very hypersensitive and even having a bedsheet on it would cause pain.  With further questioning there is no similar lesion or bruising on the right leg.  She denies any trauma.  Left ankle swelling during the day.  It resolves overnight.  He denies any pain.  She states left ankle does look dark.  She states shortness of breath and palpitations while walking.  They do resolve at rest.  When questioned she does admit to some soreness in her chest once with walking that also resolves with rest.  She is walking a little with exercise, but not on a regular basis.     Medications and allergies reviewed with patient and updated if appropriate.  Patient Active Problem List   Diagnosis Date Noted  . Poor balance 12/06/2018  . Headache 12/06/2018  . Vertigo 06/07/2018  . Urinary frequency 06/07/2018  . Lower abdominal pain 06/07/2018  . Herpes zoster without complication 09/38/1829  . Tingling in extremities 11/26/2017  . Ischial bursitis of left side 06/02/2017  . Sicca syndrome ([place]) 06/23/2016  . Bilateral leg edema 04/07/2016  . Burning sensation of skin 04/07/2016  . PMR (polymyalgia rheumatica) (HCC) 03/27/2016  . Osteopenia 03/04/2016  . Autoimmune disease ([place]) 03/18/2015  . Inflammatory arthritis 03/18/2015  . Joint pain 11/22/2014  . Overweight (BMI 25.0-29.9) 08/18/2014  . Low back pain with radiation 08/18/2014  . Hyperlipidemia 06/28/2014  . Chest pain, atypical 04/11/2014  . Tendonitis of shoulder 08/29/2013  . S/P right mastectomy 11/14/2012  . Type 2 diabetes, controlled, with neuropathy ([place]) 10/05/2012  . Allergic rhinitis 01/26/2011   . GERD 11/10/2009  . INTERSTITIAL CYSTITIS 10/31/2008  . BENIGN NEOPLASM OF VULVA 02/01/2008  . Essential hypertension, benign 02/08/2007  . History of cancer of right breast 02/08/2007    Current Outpatient Medications on File Prior to Visit  Medication Sig Dispense Refill  . amLODipine (NORVASC) 5 MG tablet TAKE 1 TABLET BY MOUTH EVERY DAY 90 tablet 0  . aspirin (ADULT ASPIRIN EC LOW STRENGTH) 81 MG EC tablet Take 81 mg by mouth daily.     Marland Kitchen atorvastatin (LIPITOR) 10 MG tablet TAKE 1 TABLET BY MOUTH DAILY 90 tablet 3  . benazepril (LOTENSIN) 40 MG tablet TAKE 1 TABLET BY MOUTH EVERY DAY 90 tablet 1  . Blood Glucose Monitoring Suppl (ONETOUCH VERIO) w/Device KIT USE TO TEST BLOOD SUGAR ONCE DAILY AS DIRECTED 1 kit 0  . calcium-vitamin D (OSCAL WITH D) 500-200 MG-UNIT tablet Take 1 tablet by mouth daily with breakfast.    . diclofenac sodium (VOLTAREN) 1 % GEL Apply 3 g to 3 large joints up to 3 times daily. 3 Tube 3  . fluticasone (FLONASE) 50 MCG/ACT nasal spray USE 2 SPRAYS IN EACH NOSTRIL EVERY DAY 48 g 0  . folic acid (FOLVITE) 937 MCG tablet Take 400 mcg by mouth daily.    Marland Kitchen glucose blood test strip USE TO TEST BLOOD SUGAR ONCE DAILY. E11.40 100 each 3  . hydroxychloroquine (PLAQUENIL) 200 MG tablet TAKE 1 TABLET BY MOUTH TWICE DAILY 180 tablet 0  .  loratadine (CLARITIN) 10 MG tablet TAKE 1 TABLET BY MOUTH EVERY DAY (Patient taking differently: TAKE 1 TABLET BY MOUTH EVERY DAY PRN) 30 tablet 2  . Multiple Vitamin (MULTIVITAMIN) tablet Take 1 tablet by mouth daily.    . Omega-3 Fatty Acids (FISH OIL) 1000 MG CAPS Take 1 capsule by mouth daily.     Marland Kitchen omeprazole (PRILOSEC) 40 MG capsule TAKE ONE CAPSULE BY MOUTH DAILY 90 capsule 1  . ONETOUCH DELICA LANCETS 17E MISC USE TO TEST ONCE DAILY 100 each 2  . ONETOUCH VERIO test strip USE TO TEST BLOOD SUGAR ONCE DAILY 100 each 0  . triamcinolone ointment (KENALOG) 0.5 % Apply 1 application topically 2 (two) times daily. Do not use for more  than 14 days in a row 15 g 0  . UNABLE TO FIND Over the counter Healthy Feet & Nerves     No current facility-administered medications on file prior to visit.    Past Medical History:  Diagnosis Date  . Breast cancer ([place]) 1990s   right breast  . Diabetes mellitus without complication ([place])   . GERD (gastroesophageal reflux disease)   . HTN (hypertension)   . Hypercholesterolemia   . Obesity     Past Surgical History:  Procedure Laterality Date  . ABDOMINAL [place]CTOMY  [place]  . COLONOSCOPY  May 2003   Dr. Tamala Julian: normal  . COLONOSCOPY  06/22/2011   Procedure: COLONOSCOPY;  Surgeon: Danie Binder, MD;  [facility];  Service: Endoscopy;  Laterality: N/A;  10:30  . MASTECTOMY  1995   right     Social History   Socioeconomic History  . Marital status: Married    Spouse name: Not on file  . Number of children: 3  . Years of education: 7  . Highest education level: Not on file  Occupational History  . Not on file  Tobacco Use  . Smoking status: Never Smoker  . Smokeless tobacco: Never Used  Substance and Sexual Activity  . Alcohol use: No  . Drug use: Never  . Sexual activity: Never    Birth control/protection: Rhythm  Other Topics Concern  . Not on file  Social History Narrative   Lives at home with husband   Caffeine, maybe 1 a day   Social Determinants of Health   Financial Resource Strain:   . Difficulty of Paying Living Expenses:   Food Insecurity:   . Worried About Charity fundraiser in the Last Year:   . Arboriculturist in the Last Year:   Transportation Needs:   . Film/video editor (Medical):   Marland Kitchen Lack of Transportation (Non-Medical):   Physical Activity:   . Days of Exercise per Week:   . Minutes of Exercise per Session:   Stress:   . Feeling of Stress :   Social Connections:   . Frequency of Communication with Friends and Family:   . Frequency of Social Gatherings with Friends and Family:   . Attends Religious  Services:   . Active Member of Clubs or Organizations:   . Attends Archivist Meetings:   Marland Kitchen Marital Status:     Family History  Problem Relation Age of Onset  . Diabetes Mother        diabetic coma  . Heart failure Mother   . Diabetes Father   . Emphysema Father   . Diabetes Sister   . Hypercholesterolemia Sister   . Hypercholesterolemia Brother   .  Diabetes Brother   . Hypertension Brother   . Hypercholesterolemia Brother   . Diabetes Sister   . Hypertension Sister   . Hypercholesterolemia Sister   . Hypercholesterolemia Sister   . Hypercholesterolemia Sister   . Diabetes Brother   . Hypertension Brother   . Colon cancer Neg Hx     Review of Systems  Respiratory: Positive for shortness of breath.   Cardiovascular: Positive for chest pain and palpitations.       Objective:   Vitals:   04/03/19 1527  BP: (!) 162/88  Pulse: 78  Resp: 16  Temp: 98 F (36.7 C)  SpO2: 99%   BP Readings from Last 3 Encounters:  04/03/19 (!) 162/88  12/06/18 (!) 146/82  11/29/18 133/80   Wt Readings from Last 3 Encounters:  04/03/19 197 lb (89.4 kg)  12/06/18 199 lb 6.4 oz (90.4 kg)  11/29/18 199 lb 12.8 oz (90.6 kg)   Body mass index is 28.67 kg/m.   Physical Exam    Constitutional: Appears well-developed and well-nourished. No distress.  Head: Normocephalic and atraumatic.  Neck: Neck supple. No tracheal deviation present. No thyromegaly present.  No cervical lymphadenopathy Cardiovascular: Normal rate, regular rhythm and normal heart sounds.  No murmur heard. No carotid bruit .  Several small varicose veins left ankle associated with mild swelling and slightly hyperpigmented skin compared to right. Pulmonary/Chest: Effort normal and breath sounds normal. No respiratory distress. No has no wheezes. No rales.  Skin: Skin is warm and dry. Not diaphoretic.  Hyperpigmented skin lesion left anterior lower leg-nontender, nonswollen.  She has a couple of other very dark  moles 1 on left lower leg and one on right lower leg.  Advise dermatology evaluation Psychiatric: Normal mood and affect. Behavior is normal.       Assessment & Plan:    See Problem List for Assessment and Plan of chronic medical problems.    This visit occurred during the SARS-CoV-2 public health emergency.  Safety protocols were in place, including screening questions prior to the visit, additional usage of staff PPE, and extensive cleaning of exam room while observing appropriate contact time as indicated for disinfecting solutions.

## 2019-04-03 ENCOUNTER — Other Ambulatory Visit: Payer: Self-pay

## 2019-04-03 ENCOUNTER — Ambulatory Visit (INDEPENDENT_AMBULATORY_CARE_PROVIDER_SITE_OTHER): Payer: PPO | Admitting: Internal Medicine

## 2019-04-03 ENCOUNTER — Encounter: Payer: Self-pay | Admitting: Internal Medicine

## 2019-04-03 VITALS — BP 162/88 | HR 78 | Temp 98.0°F | Resp 16 | Ht 69.5 in | Wt 197.0 lb

## 2019-04-03 DIAGNOSIS — R079 Chest pain, unspecified: Secondary | ICD-10-CM | POA: Diagnosis not present

## 2019-04-03 DIAGNOSIS — M25472 Effusion, left ankle: Secondary | ICD-10-CM | POA: Diagnosis not present

## 2019-04-03 DIAGNOSIS — L819 Disorder of pigmentation, unspecified: Secondary | ICD-10-CM | POA: Diagnosis not present

## 2019-04-03 DIAGNOSIS — R0600 Dyspnea, unspecified: Secondary | ICD-10-CM

## 2019-04-03 DIAGNOSIS — R0609 Other forms of dyspnea: Secondary | ICD-10-CM | POA: Insufficient documentation

## 2019-04-03 NOTE — Patient Instructions (Addendum)
Elevate your legs when you are sitting.  Start wearing compression socks.     See the dermatologist to check your skin abnormalities on the legs.   Monitor your BP a couple times a day.     A referral was ordered for cardiology.  They will call you to set up an appointment.

## 2019-04-03 NOTE — Assessment & Plan Note (Signed)
Acute Complaining of chest soreness that improves with rest Associated symptoms palpitations and dyspnea Will refer to cardiology for further evaluation

## 2019-04-03 NOTE — Assessment & Plan Note (Addendum)
Acute - left leg only This has been going on for a while, but this is the first time she is complaining about it Has swelling in the leg secondary to varicose veins Advised regular exercise, elevating legs when sitting She is compliant with low-sodium diet Recommended compression socks Can refer to vascular if symptoms do not improve

## 2019-04-03 NOTE — Assessment & Plan Note (Signed)
Chronic Left proximal anterior lower leg This is no longer painful Advised to see dermatology-may need a biopsy-seems to be superficial.  There is a varicose vein there, but this is nontender and I do not think it is related

## 2019-04-03 NOTE — Assessment & Plan Note (Signed)
Acute Complaining of dyspnea on exertion that improves with rest Associated symptoms palpitations and chest soreness Will refer to cardiology for further evaluation

## 2019-04-14 ENCOUNTER — Other Ambulatory Visit: Payer: Self-pay | Admitting: Rheumatology

## 2019-04-14 NOTE — Telephone Encounter (Signed)
Last Visit: 11/29/18 Next Visit: 05/01/19 Labs: 11/07/18 stable White cell count is mildly decreased.  PLQ Eye Exam: 01/09/2019 WNL   Okay to refill per Dr. Estanislado Pandy

## 2019-04-18 ENCOUNTER — Other Ambulatory Visit: Payer: Self-pay | Admitting: Internal Medicine

## 2019-04-20 ENCOUNTER — Telehealth: Payer: Self-pay | Admitting: Cardiology

## 2019-04-20 ENCOUNTER — Ambulatory Visit: Payer: PPO | Admitting: Cardiology

## 2019-04-20 ENCOUNTER — Encounter: Payer: Self-pay | Admitting: Cardiology

## 2019-04-20 ENCOUNTER — Other Ambulatory Visit: Payer: Self-pay

## 2019-04-20 VITALS — BP 148/78 | HR 90 | Ht 69.0 in | Wt 196.0 lb

## 2019-04-20 DIAGNOSIS — R0602 Shortness of breath: Secondary | ICD-10-CM

## 2019-04-20 DIAGNOSIS — R0600 Dyspnea, unspecified: Secondary | ICD-10-CM

## 2019-04-20 DIAGNOSIS — R0609 Other forms of dyspnea: Secondary | ICD-10-CM

## 2019-04-20 NOTE — Progress Notes (Signed)
Clinical Summary Angela Simmons is a 74 y.o.female seen as new consult, referred by Dr Quay Burow for   1. DOE - DOE x 1 month. Can occur with housework like sweeping or laundry, walkign - can have some LE edema. No orthopnea. No cough or wheezing.  - to me she denies any specific ches tpain.   CAD risk factors: age, HTN, HL, diet controlled DM2 - 2017 stress echo no ischemia.     Past Medical History:  Diagnosis Date  . Breast cancer ([place]) 1990s   right breast  . Diabetes mellitus without complication ([place])   . GERD (gastroesophageal reflux disease)   . HTN (hypertension)   . Hypercholesterolemia   . Obesity      Allergies  Allergen Reactions  . Levofloxacin Hives and Itching  . Nitrofurantoin Hives and Itching  . Penicillins Hives and Itching  . Sulfonamide Derivatives Itching  . Pantoprazole     Bad dreams     Current Outpatient Medications  Medication Sig Dispense Refill  . hydroxychloroquine (PLAQUENIL) 200 MG tablet TAKE 1 TABLET BY MOUTH TWICE DAILY 180 tablet 0  . amLODipine (NORVASC) 5 MG tablet TAKE 1 TABLET BY MOUTH EVERY DAY 90 tablet 0  . aspirin (ADULT ASPIRIN EC LOW STRENGTH) 81 MG EC tablet Take 81 mg by mouth daily.     Marland Kitchen atorvastatin (LIPITOR) 10 MG tablet TAKE 1 TABLET BY MOUTH DAILY 90 tablet 3  . benazepril (LOTENSIN) 40 MG tablet TAKE 1 TABLET BY MOUTH EVERY DAY 90 tablet 1  . Blood Glucose Monitoring Suppl (ONETOUCH VERIO) w/Device KIT USE TO TEST BLOOD SUGAR ONCE DAILY AS DIRECTED 1 kit 0  . calcium-vitamin D (OSCAL WITH D) 500-200 MG-UNIT tablet Take 1 tablet by mouth daily with breakfast.    . diclofenac sodium (VOLTAREN) 1 % GEL Apply 3 g to 3 large joints up to 3 times daily. 3 Tube 3  . fluticasone (FLONASE) 50 MCG/ACT nasal spray USE 2 SPRAYS IN EACH NOSTRIL EVERY DAY 48 g 0  . folic acid (FOLVITE) 938 MCG tablet Take 400 mcg by mouth daily.    Marland Kitchen glucose blood test strip USE TO TEST BLOOD SUGAR ONCE DAILY. E11.40 100 each 3  . loratadine  (CLARITIN) 10 MG tablet TAKE 1 TABLET BY MOUTH EVERY DAY (Patient taking differently: TAKE 1 TABLET BY MOUTH EVERY DAY PRN) 30 tablet 2  . Multiple Vitamin (MULTIVITAMIN) tablet Take 1 tablet by mouth daily.    . Omega-3 Fatty Acids (FISH OIL) 1000 MG CAPS Take 1 capsule by mouth daily.     Marland Kitchen omeprazole (PRILOSEC) 40 MG capsule TAKE ONE CAPSULE BY MOUTH DAILY 90 capsule 1  . ONETOUCH DELICA LANCETS 18E MISC USE TO TEST ONCE DAILY 100 each 2  . ONETOUCH VERIO test strip USE TO TEST BLOOD SUGAR ONCE DAILY 100 each 0  . triamcinolone ointment (KENALOG) 0.5 % Apply 1 application topically 2 (two) times daily. Do not use for more than 14 days in a row 15 g 0  . UNABLE TO FIND Over the counter Healthy Feet & Nerves     No current facility-administered medications for this visit.     Past Surgical History:  Procedure Laterality Date  . ABDOMINAL [place]CTOMY  [place]  . COLONOSCOPY  May 2003   Dr. Tamala Julian: normal  . COLONOSCOPY  06/22/2011   Procedure: COLONOSCOPY;  Surgeon: Danie Binder, MD;  [facility];  Service: Endoscopy;  Laterality:  N/A;  10:30  . MASTECTOMY  1995   right      Allergies  Allergen Reactions  . Levofloxacin Hives and Itching  . Nitrofurantoin Hives and Itching  . Penicillins Hives and Itching  . Sulfonamide Derivatives Itching  . Pantoprazole     Bad dreams      Family History  Problem Relation Age of Onset  . Diabetes Mother        diabetic coma  . Heart failure Mother   . Diabetes Father   . Emphysema Father   . Diabetes Sister   . Hypercholesterolemia Sister   . Hypercholesterolemia Brother   . Diabetes Brother   . Hypertension Brother   . Hypercholesterolemia Brother   . Diabetes Sister   . Hypertension Sister   . Hypercholesterolemia Sister   . Hypercholesterolemia Sister   . Hypercholesterolemia Sister   . Diabetes Brother   . Hypertension Brother   . Colon cancer Neg Hx      Social History Ms. Cristobal reports  that she has never smoked. She has never used smokeless tobacco. Ms. Phegley reports no history of alcohol use.   Review of Systems CONSTITUTIONAL: No weight loss, fever, chills, weakness or fatigue.  HEENT: Eyes: No visual loss, blurred vision, double vision or yellow sclerae.No hearing loss, sneezing, congestion, runny nose or sore throat.  SKIN: No rash or itching.  CARDIOVASCULAR: per hpi RESPIRATORY: per hpi GASTROINTESTINAL: No anorexia, nausea, vomiting or diarrhea. No abdominal pain or blood.  GENITOURINARY: No burning on urination, no polyuria NEUROLOGICAL: No headache, dizziness, syncope, paralysis, ataxia, numbness or tingling in the extremities. No change in bowel or bladder control.  MUSCULOSKELETAL: No muscle, back pain, joint pain or stiffness.  LYMPHATICS: No enlarged nodes. No history of splenectomy.  PSYCHIATRIC: No history of depression or anxiety.  ENDOCRINOLOGIC: No reports of sweating, cold or heat intolerance. No polyuria or polydipsia.  Marland Kitchen   Physical Examination Today's Vitals   04/20/19 1054  BP: (!) 148/78  Pulse: 90  SpO2: 98%  Weight: 196 lb (88.9 kg)  Height: 5' 9"  (1.753 m)   Body mass index is 28.94 kg/m.  Gen: resting comfortably, no acute distress HEENT: no scleral icterus, pupils equal round and reactive, no palptable cervical adenopathy,  CV: RRR, 2/6 systolic murmur rusb, no jvd Resp: Clear to auscultation bilaterally GI: abdomen is soft, non-tender, non-distended, normal bowel sounds, no hepatosplenomegaly MSK: extremities are warm, no edema.  Skin: warm, no rash Neuro:  no focal deficits Psych: appropriate affect     Assessment and Plan  1. DOE - unclear etiology - will start workup with an echo. Pending results consider lexiscan, she does have multiple CAD risk factors - EKG today shows SR, no acute ischemic changes      Arnoldo Lenis, M.D.

## 2019-04-20 NOTE — Telephone Encounter (Signed)
  Precert needed for: ECHO   Location: [facility]    Date: May 11, 2019

## 2019-04-20 NOTE — Patient Instructions (Signed)
Your physician recommends that you schedule a follow-up appointment in: [place] recommends that you continue on your current medications as directed. Please refer to the Current Medication list given to you today.  Your physician has requested that you have an echocardiogram. Echocardiography is a painless test that uses sound waves to create images of your heart. It provides your doctor with information about the size and shape of your heart and how well your heart's chambers and valves are working. This procedure takes approximately one hour. There are no restrictions for this procedure.  Thank you for choosing [facility]!!

## 2019-04-24 DIAGNOSIS — N907 Vulvar cyst: Secondary | ICD-10-CM | POA: Diagnosis not present

## 2019-04-26 NOTE — Progress Notes (Signed)
Office Visit Note  Patient: Angela Simmons             Date of Birth: [birth year]           MRN: [id number]             PCP: Binnie Rail, MD Referring: Binnie Rail, MD Visit Date: 05/01/2019 Occupation: @GUAROCC @  Subjective:  Osteoarthritis (Some aches and pains)   History of Present Illness: Angela Simmons is a 74 y.o. female with history of autoimmune disease, osteoarthritis and polymyalgia rheumatica.  She states she has been having pain and discomfort in her left hip.  She also has been experiencing tingling and numbness in her bilateral hands.  She denies any flare of polymyalgia rheumatica.  She continues to have some dry mouth symptoms.  She has been taking Plaquenil on a regular basis.  Activities of Daily Living:  Patient reports morning stiffness for 45 minutes.   Patient Reports nocturnal pain.  Difficulty dressing/grooming: Denies Difficulty climbing stairs: Reports Difficulty getting out of chair: Reports Difficulty using hands for taps, buttons, cutlery, and/or writing: Reports  Review of Systems  Constitutional: Positive for fatigue. Negative for night sweats, weight gain and weight loss.  HENT: Positive for mouth dryness. Negative for mouth sores, trouble swallowing, trouble swallowing and nose dryness.   Eyes: Positive for dryness. Negative for pain, redness and visual disturbance.  Respiratory: Positive for shortness of breath. Negative for cough and difficulty breathing.   Cardiovascular: Negative for chest pain, palpitations, hypertension, irregular heartbeat and swelling in legs/feet.  Gastrointestinal: Negative for blood in stool, constipation and diarrhea.  Endocrine: Negative for cold intolerance, excessive thirst and increased urination.  Genitourinary: Negative for difficulty urinating and vaginal dryness.  Musculoskeletal: Positive for arthralgias, gait problem, joint pain, morning stiffness and muscle tenderness. Negative for joint swelling,  myalgias, muscle weakness and myalgias.  Skin: Negative for color change, rash, hair loss, skin tightness, ulcers and sensitivity to sunlight.  Allergic/Immunologic: Negative for susceptible to infections.  Neurological: Positive for numbness. Negative for dizziness, memory loss, night sweats and weakness.  Hematological: Negative for bruising/bleeding tendency and swollen glands.  Psychiatric/Behavioral: Positive for sleep disturbance. Negative for depressed mood. The patient is not nervous/anxious.     PMFS History:  Patient Active Problem List   Diagnosis Date Noted  . DOE (dyspnea on exertion) 04/03/2019  . Ankle swelling, left 04/03/2019  . Hyperpigmented skin lesion 04/03/2019  . Poor balance 12/06/2018  . Headache 12/06/2018  . Vertigo 06/07/2018  . Urinary frequency 06/07/2018  . Lower abdominal pain 06/07/2018  . Herpes zoster without complication [id number]  . Tingling in extremities 11/26/2017  . Ischial bursitis of left side 06/02/2017  . Sicca syndrome ([place]) 06/23/2016  . Bilateral leg edema 04/07/2016  . Burning sensation of skin 04/07/2016  . PMR (polymyalgia rheumatica) (HCC) 03/27/2016  . Osteopenia 03/04/2016  . Autoimmune disease ([place]) 03/18/2015  . Inflammatory arthritis 03/18/2015  . Joint pain 11/22/2014  . Overweight (BMI 25.0-29.9) 08/18/2014  . Low back pain with radiation 08/18/2014  . Hyperlipidemia 06/28/2014  . Chest pain 04/11/2014  . Tendonitis of shoulder 08/29/2013  . S/P right mastectomy 11/14/2012  . Type 2 diabetes, controlled, with neuropathy ([place]) 10/05/2012  . Allergic rhinitis 01/26/2011  . GERD 11/10/2009  . INTERSTITIAL CYSTITIS 10/31/2008  . BENIGN NEOPLASM OF VULVA 02/01/2008  . Essential hypertension, benign 02/08/2007  . History of cancer of right breast 02/08/2007    Past Medical History:  Diagnosis Date  .  Breast cancer ([place]) 1990s   right breast  . Diabetes mellitus without complication ([place])   . GERD (gastroesophageal  reflux disease)   . HTN (hypertension)   . Hypercholesterolemia   . Obesity   . Osteoarthritis     Family History  Problem Relation Age of Onset  . Diabetes Mother        diabetic coma  . Heart failure Mother   . Diabetes Father   . Emphysema Father   . Diabetes Sister   . Hypercholesterolemia Sister   . Hypercholesterolemia Brother   . Diabetes Brother   . Hypertension Brother   . Hypercholesterolemia Brother   . Diabetes Sister   . Hypertension Sister   . Hypercholesterolemia Sister   . Hypercholesterolemia Sister   . Hypercholesterolemia Sister   . Diabetes Brother   . Hypertension Brother   . Colon cancer Neg Hx    Past Surgical History:  Procedure Laterality Date  . ABDOMIN[place]CTOMY  [place]  . COLONOSCOPY  May 2003   Dr. Tamala Julian: normal  . COLONOSCOPY  06/22/2011   Procedure: COLONOSCOPY;  Surgeon: Danie Binder, MD;  [facility];  Service: Endoscopy;  Laterality: N/A;  10:30  . MASTECTOMY  1995   right    Social History   Social History Narrative   Lives at home with husband   Caffeine, maybe 1 a day   Immunization History  Administered Date(s) Administered  . Fluad Quad(high Dose 65+) 12/06/2018  . Influenza Split 11/14/2010  . Influenza Whole 10/18/2006, 10/31/2008, 11/06/2009  . Influenza, High Dose Seasonal PF 11/22/2014, 02/21/2016, 12/18/2016, 11/05/2017  . Influenza,inj,Quad PF,6+ Mos 11/14/2012, 11/23/2013  . Pneumococcal Conjugate-13 04/11/2014  . Pneumococcal Polysaccharide-23 09/17/2011  . Td 07/05/2003  . Zoster 06/15/2013  . Zoster Recombinat (Shingrix) 11/30/2017, 02/02/2018     Objective: Vital Signs: BP (!) 145/87 (BP Location: Left Arm, Patient Position: Sitting, Cuff Size: Normal)   Pulse 74   Ht 5\' 9"  (1.753 m)   Wt 198 lb 6.4 oz (90 kg)   BMI 29.30 kg/m    Physical Exam Vitals and nursing note reviewed.  Constitutional:      Appearance: She is well-developed.  HENT:     Head: Normocephalic  and atraumatic.  Eyes:     Conjunctiva/sclera: Conjunctivae normal.  Cardiovascular:     Rate and Rhythm: Normal rate and regular rhythm.     Heart sounds: Normal heart sounds.  Pulmonary:     Effort: Pulmonary effort is normal.     Breath sounds: Normal breath sounds.  Abdominal:     General: Bowel sounds are normal.     Palpations: Abdomen is soft.  Musculoskeletal:     Cervical back: Normal range of motion.  Lymphadenopathy:     Cervical: No cervical adenopathy.  Skin:    General: Skin is warm and dry.     Capillary Refill: Capillary refill takes less than 2 seconds.  Neurological:     Mental Status: She is alert and oriented to person, place, and time.  Psychiatric:        Behavior: Behavior normal.      Musculoskeletal Exam: C-spine was in good range of motion.  Shoulder joints, elbow joints, wrist joints with good range of motion.  She has bilateral PIP and DIP thickening.  Tinel's, Phalen's, manual compression test were negative.  Shoulder joints, elbow joints, wrist joints, MCPs PIPs DIPs with good range of motion.  She has tenderness on palpation  of bilateral trochanteric bursa consistent with trochanteric bursitis  CDAI Exam: CDAI Score: -- Patient Global: --; Provider Global: -- Swollen: --; Tender: -- Joint Exam 05/01/2019   No joint exam has been documented for this visit   There is currently no information documented on the homunculus. Go to the Rheumatology activity and complete the homunculus joint exam.  Investigation: No additional findings.  Imaging: No results found.  Recent Labs: Lab Results  Component Value Date   WBC 3.7 (L) 11/07/2018   HGB 12.2 11/07/2018   PLT 238 11/07/2018   NA 141 12/06/2018   K 3.9 12/06/2018   CL 105 12/06/2018   CO2 28 12/06/2018   GLUCOSE 96 12/06/2018   BUN 13 12/06/2018   CREATININE 0.87 12/06/2018   BILITOT 0.4 12/06/2018   ALKPHOS 76 12/06/2018   AST 19 12/06/2018   ALT 26 12/06/2018   PROT 7.6  12/06/2018   ALBUMIN 4.1 12/06/2018   CALCIUM 9.3 12/06/2018   GFRAA 68 11/07/2018    Speciality Comments: PLQ EYe Exam: 01/09/2019 WNL @ [facility] [place] up in 1 year  Procedures:  No procedures performed Allergies: Levofloxacin, Nitrofurantoin, Penicillins, Sulfonamide derivatives, and Pantoprazole   Assessment / Plan:     Visit Diagnoses: Autoimmune disease ([place]) - ANA+, sicca symptoms.  She continues to have mild sicca symptoms.  Sicca syndrome (HCC)-she has been using over-the-counter products and is taking Plaquenil.  High risk medication use - Plaquenil 200 mg 1 tablet by mouth twice daily.  Have advised her to reduce Plaquenil to 1 tablet p.o. twice daily Monday to Friday as she is doing fairly well.- Plan: CBC with Differential/Platelet, COMPLETE METABOLIC PANEL WITH GFR, will be obtained today and then at the follow-up visit.  PMR (polymyalgia rheumatica) (HCC) - In remission.  Patient has no muscular weakness or tenderness.  Paresthesia of both hands-she complains of paresthesias in bilateral hands.  I offered nerve conduction velocities which she declined.  Phalen's and Tinel's test were negative.  Patient states that she will contact us in case her symptoms get worse.  Other fatigue-she continues to have chronic fatigue.  Trochanteric bursitis of both hips-I have given her a handout on IT band exercises.  Osteopenia of multiple sites-followed by Dr. Quay Burow.  Have advised her to get repeat bone density.  Other medical problems are listed as follows:   History of gastroesophageal reflux (GERD)  History of peripheral neuropathy  S/P right mastectomy  History of cancer of right breast  History of hyperlipidemia  History of hypertension  Orders: Orders Placed This Encounter  Procedures  . CBC with Differential/Platelet  . COMPLETE METABOLIC PANEL WITH GFR   No orders of the defined types were placed in this encounter.    Follow-Up Instructions: Return  in about 5 months (around 10/01/2019) for Autoimmune disease, Osteoarthritis.   Bo Merino, MD  Note - This record has been created using Editor, commissioning.  Chart creation errors have been sought, but may not always  have been located. Such creation errors do not reflect on  the standard of medical care.

## 2019-05-01 ENCOUNTER — Encounter: Payer: Self-pay | Admitting: Physician Assistant

## 2019-05-01 ENCOUNTER — Other Ambulatory Visit: Payer: Self-pay

## 2019-05-01 ENCOUNTER — Ambulatory Visit: Payer: PPO | Admitting: Rheumatology

## 2019-05-01 VITALS — BP 145/87 | HR 74 | Ht 69.0 in | Wt 198.4 lb

## 2019-05-01 DIAGNOSIS — R202 Paresthesia of skin: Secondary | ICD-10-CM | POA: Diagnosis not present

## 2019-05-01 DIAGNOSIS — R5383 Other fatigue: Secondary | ICD-10-CM | POA: Diagnosis not present

## 2019-05-01 DIAGNOSIS — Z8719 Personal history of other diseases of the digestive system: Secondary | ICD-10-CM

## 2019-05-01 DIAGNOSIS — M353 Polymyalgia rheumatica: Secondary | ICD-10-CM | POA: Diagnosis not present

## 2019-05-01 DIAGNOSIS — M3500 Sicca syndrome, unspecified: Secondary | ICD-10-CM

## 2019-05-01 DIAGNOSIS — M359 Systemic involvement of connective tissue, unspecified: Secondary | ICD-10-CM | POA: Diagnosis not present

## 2019-05-01 DIAGNOSIS — Z79899 Other long term (current) drug therapy: Secondary | ICD-10-CM | POA: Diagnosis not present

## 2019-05-01 DIAGNOSIS — M8589 Other specified disorders of bone density and structure, multiple sites: Secondary | ICD-10-CM

## 2019-05-01 DIAGNOSIS — Z8669 Personal history of other diseases of the nervous system and sense organs: Secondary | ICD-10-CM

## 2019-05-01 DIAGNOSIS — Z9011 Acquired absence of right breast and nipple: Secondary | ICD-10-CM | POA: Diagnosis not present

## 2019-05-01 DIAGNOSIS — M7061 Trochanteric bursitis, right hip: Secondary | ICD-10-CM

## 2019-05-01 DIAGNOSIS — M7062 Trochanteric bursitis, left hip: Secondary | ICD-10-CM

## 2019-05-01 DIAGNOSIS — Z8679 Personal history of other diseases of the circulatory system: Secondary | ICD-10-CM

## 2019-05-01 DIAGNOSIS — Z853 Personal history of malignant neoplasm of breast: Secondary | ICD-10-CM | POA: Diagnosis not present

## 2019-05-01 DIAGNOSIS — Z8639 Personal history of other endocrine, nutritional and metabolic disease: Secondary | ICD-10-CM

## 2019-05-01 NOTE — Patient Instructions (Signed)
Iliotibial Band Syndrome Rehab Ask your health care provider which exercises are safe for you. Do exercises exactly as told by your health care provider and adjust them as directed. It is normal to feel mild stretching, pulling, tightness, or discomfort as you do these exercises. Stop right away if you feel sudden pain or your pain gets significantly worse. Do not begin these exercises until told by your health care provider. Stretching and range-of-motion exercises These exercises warm up your muscles and joints and improve the movement and flexibility of your hip and pelvis. Quadriceps stretch, prone  1. Lie on your abdomen on a firm surface, such as a bed or padded floor (prone position). 2. Bend your left / right knee and reach back to hold your ankle or pant leg. If you cannot reach your ankle or pant leg, loop a belt around your foot and grab the belt instead. 3. Gently pull your heel toward your buttocks. Your knee should not slide out to the side. You should feel a stretch in the front of your thigh and knee (quadriceps). 4. Hold this position for __________ seconds. Repeat __________ times. Complete this exercise __________ times a day. Iliotibial band stretch An iliotibial band is a strong band of muscle tissue that runs from the outer side of your hip to the outer side of your thigh and knee. 1. Lie on your side with your left / right leg in the top position. 2. Bend both of your knees and grab your left / right ankle. Stretch out your bottom arm to help you balance. 3. Slowly bring your top knee back so your thigh goes behind your trunk. 4. Slowly lower your top leg toward the floor until you feel a gentle stretch on the outside of your left / right hip and thigh. If you do not feel a stretch and your knee will not fall farther, place the heel of your other foot on top of your knee and pull your knee down toward the floor with your foot. 5. Hold this position for __________  seconds. Repeat __________ times. Complete this exercise __________ times a day. Strengthening exercises These exercises build strength and endurance in your hip and pelvis. Endurance is the ability to use your muscles for a long time, even after they get tired. Straight leg raises, side-lying This exercise strengthens the muscles that rotate the leg at the hip and move it away from your body (hip abductors). 1. Lie on your side with your left / right leg in the top position. Lie so your head, shoulder, hip, and knee line up. You may bend your bottom knee to help you balance. 2. Roll your hips slightly forward so your hips are stacked directly over each other and your left / right knee is facing forward. 3. Tense the muscles in your outer thigh and lift your top leg 4-6 inches (10-15 cm). 4. Hold this position for __________ seconds. 5. Slowly return to the starting position. Let your muscles relax completely before doing another repetition. Repeat __________ times. Complete this exercise __________ times a day. Leg raises, prone This exercise strengthens the muscles that move the hips (hip extensors). 1. Lie on your abdomen on your bed or a firm surface. You can put a pillow under your hips if that is more comfortable for your lower back. 2. Bend your left / right knee so your foot is straight up in the air. 3. Squeeze your buttocks muscles and lift your left / right thigh   off the bed. Do not let your back arch. 4. Tense your thigh muscle as hard as you can without increasing any knee pain. 5. Hold this position for __________ seconds. 6. Slowly lower your leg to the starting position and allow it to relax completely. Repeat __________ times. Complete this exercise __________ times a day. Hip hike 1. Stand sideways on a bottom step. Stand on your left / right leg with your other foot unsupported next to the step. You can hold on to the railing or wall for balance if needed. 2. Keep your knees  straight and your torso square. Then lift your left / right hip up toward the ceiling. 3. Slowly let your left / right hip lower toward the floor, past the starting position. Your foot should get closer to the floor. Do not lean or bend your knees. Repeat __________ times. Complete this exercise __________ times a day. This information is not intended to replace advice given to you by your health care provider. Make sure you discuss any questions you have with your health care provider. Document Revised: 04/28/2018 Document Reviewed: 10/27/2017 Elsevier Patient Education  2020 [employer] Inc.  

## 2019-05-02 ENCOUNTER — Telehealth: Payer: Self-pay | Admitting: *Deleted

## 2019-05-02 DIAGNOSIS — Z79899 Other long term (current) drug therapy: Secondary | ICD-10-CM

## 2019-05-02 LAB — COMPLETE METABOLIC PANEL WITH GFR
AG Ratio: 1.3 (calc) (ref 1.0–2.5)
ALT: 22 U/L (ref 6–29)
AST: 21 U/L (ref 10–35)
Albumin: 3.9 g/dL (ref 3.6–5.1)
Alkaline phosphatase (APISO): 66 U/L (ref 37–153)
BUN: 15 mg/dL (ref 7–25)
CO2: 27 mmol/L (ref 20–32)
Calcium: 9.3 mg/dL (ref 8.6–10.4)
Chloride: 107 mmol/L (ref 98–110)
Creat: 0.87 mg/dL (ref 0.60–0.93)
GFR, Est African American: 77 mL/min/{1.73_m2} (ref >=60)
GFR, Est Non African American: 67 mL/min/{1.73_m2} (ref >=60)
Globulin: 3.1 g/dL (calc) (ref 1.9–3.7)
Glucose, Bld: 82 mg/dL (ref 65–99)
Potassium: 4.1 mmol/L (ref 3.5–5.3)
Sodium: 141 mmol/L (ref 135–146)
Total Bilirubin: 0.5 mg/dL (ref 0.2–1.2)
Total Protein: 7 g/dL (ref 6.1–8.1)

## 2019-05-02 LAB — CBC WITH DIFFERENTIAL/PLATELET
Absolute Monocytes: 171 cells/uL — ABNORMAL LOW (ref 200–950)
Basophils Absolute: 31 cells/uL (ref 0–200)
Basophils Relative: 1.1 %
Eosinophils Absolute: 31 cells/uL (ref 15–500)
Eosinophils Relative: 1.1 %
HCT: 36.6 % (ref 35.0–45.0)
Hemoglobin: 11.8 g/dL (ref 11.7–15.5)
Lymphs Abs: 854 cells/uL (ref 850–3900)
MCH: 29.6 pg (ref 27.0–33.0)
MCHC: 32.2 g/dL (ref 32.0–36.0)
MCV: 92 fL (ref 80.0–100.0)
MPV: 11.1 fL (ref 7.5–12.5)
Monocytes Relative: 6.1 %
Neutro Abs: 1714 cells/uL (ref 1500–7800)
Neutrophils Relative %: 61.2 %
Platelets: 231 10*3/uL (ref 140–400)
RBC: 3.98 10*6/uL (ref 3.80–5.10)
RDW: 13.3 % (ref 11.0–15.0)
Total Lymphocyte: 30.5 %
WBC: 2.8 10*3/uL — ABNORMAL LOW (ref 3.8–10.8)

## 2019-05-02 MED ORDER — HYDROXYCHLOROQUINE SULFATE 200 MG PO TABS: ORAL_TABLET | ORAL | 0 refills | Status: DC

## 2019-05-02 NOTE — Telephone Encounter (Signed)
-----   Message from Bo Merino, MD sent at 05/02/2019  7:53 AM EDT ----- White cell count is low.  Please have patient reduce Plaquenil to 1 tablet Monday to Friday.  Repeat WBC count in 1 month.

## 2019-05-02 NOTE — Progress Notes (Signed)
White cell count is low.  Please have patient reduce Plaquenil to 1 tablet Monday to Friday.  Repeat WBC count in 1 month.

## 2019-05-11 ENCOUNTER — Other Ambulatory Visit: Payer: Self-pay

## 2019-05-11 ENCOUNTER — Ambulatory Visit (INDEPENDENT_AMBULATORY_CARE_PROVIDER_SITE_OTHER): Payer: PPO

## 2019-05-11 DIAGNOSIS — R0602 Shortness of breath: Secondary | ICD-10-CM

## 2019-05-16 ENCOUNTER — Telehealth: Payer: Self-pay | Admitting: Cardiology

## 2019-05-16 DIAGNOSIS — R0602 Shortness of breath: Secondary | ICD-10-CM

## 2019-05-16 NOTE — Telephone Encounter (Signed)
Patient called requesting results of recent echo.  

## 2019-05-16 NOTE — Telephone Encounter (Signed)
Pt aware and agreeable to stress test - order placed and will forward to schedulers - pt aware of instructions of stress test    Normal echo, heart function looks good. Can we order a lexiscan for SOB for her    Zandra Abts MD

## 2019-05-16 NOTE — Telephone Encounter (Signed)
Just resulted   J Irvin Lizama MD 

## 2019-05-18 ENCOUNTER — Telehealth: Payer: Self-pay | Admitting: Cardiology

## 2019-05-18 NOTE — Telephone Encounter (Signed)
Pre-cert Verification for the following procedure    LEXISCAN MYOVIEW   DATE:   05/25/2019  LOCATION:  [facility]

## 2019-05-22 DIAGNOSIS — L2089 Other atopic dermatitis: Secondary | ICD-10-CM | POA: Diagnosis not present

## 2019-05-22 DIAGNOSIS — I8392 Asymptomatic varicose veins of left lower extremity: Secondary | ICD-10-CM | POA: Diagnosis not present

## 2019-05-22 DIAGNOSIS — L720 Epidermal cyst: Secondary | ICD-10-CM | POA: Diagnosis not present

## 2019-05-25 ENCOUNTER — Ambulatory Visit (HOSPITAL_COMMUNITY)
Admission: RE | Admit: 2019-05-25 | Discharge: 2019-05-25 | Disposition: A | Discharge status: Home / Self Care | Payer: PPO | Source: Ambulatory Visit | Attending: Cardiology | Admitting: Cardiology

## 2019-05-25 ENCOUNTER — Encounter (HOSPITAL_COMMUNITY): Admission: RE | Admit: 2019-05-25 | Payer: PPO | Source: Ambulatory Visit

## 2019-05-25 ENCOUNTER — Encounter (HOSPITAL_COMMUNITY): Payer: Self-pay

## 2019-05-25 ENCOUNTER — Other Ambulatory Visit: Payer: Self-pay

## 2019-05-25 DIAGNOSIS — R0602 Shortness of breath: Secondary | ICD-10-CM | POA: Insufficient documentation

## 2019-05-26 ENCOUNTER — Encounter (HOSPITAL_COMMUNITY): Payer: Self-pay

## 2019-05-26 ENCOUNTER — Encounter (HOSPITAL_COMMUNITY)
Admission: RE | Admit: 2019-05-26 | Discharge: 2019-05-26 | Disposition: A | Discharge status: Home / Self Care | Payer: PPO | Source: Ambulatory Visit | Attending: Cardiology | Admitting: Cardiology

## 2019-05-26 ENCOUNTER — Encounter (HOSPITAL_BASED_OUTPATIENT_CLINIC_OR_DEPARTMENT_OTHER)
Admission: RE | Admit: 2019-05-26 | Discharge: 2019-05-26 | Disposition: A | Discharge status: Home / Self Care | Payer: PPO | Source: Ambulatory Visit | Attending: Cardiology | Admitting: Cardiology

## 2019-05-26 DIAGNOSIS — R0602 Shortness of breath: Secondary | ICD-10-CM

## 2019-05-26 HISTORY — DX: Systemic involvement of connective tissue, unspecified: M35.9

## 2019-05-26 LAB — NM MYOCAR MULTI W/SPECT W/WALL MOTION / EF
LV dias vol: 73 mL (ref 46–106)
LV sys vol: 21 mL
Peak HR: 107 {beats}/min
RATE: 0.4
Rest HR: 70 {beats}/min
SDS: 0
SRS: 3
SSS: 3
TID: 1.14

## 2019-05-26 MED ORDER — REGADENOSON 0.4 MG/5ML IV SOLN
INTRAVENOUS | Status: AC
  Administered 2019-05-26: 0.4 mg via INTRAVENOUS
  Filled 2019-05-26: qty 5

## 2019-05-26 MED ORDER — TECHNETIUM TC 99M TETROFOSMIN IV KIT
10.0000 | PACK | Freq: Once | INTRAVENOUS | Status: AC | PRN
  Administered 2019-05-26: 10.6 via INTRAVENOUS

## 2019-05-26 MED ORDER — SODIUM CHLORIDE FLUSH 0.9 % IV SOLN
INTRAVENOUS | Status: AC
  Administered 2019-05-26: 10 mL via INTRAVENOUS
  Filled 2019-05-26: qty 10

## 2019-05-26 MED ORDER — TECHNETIUM TC 99M TETROFOSMIN IV KIT
30.0000 | PACK | Freq: Once | INTRAVENOUS | Status: AC | PRN
  Administered 2019-05-26: 31 via INTRAVENOUS

## 2019-06-01 ENCOUNTER — Telehealth: Payer: Self-pay | Admitting: *Deleted

## 2019-06-01 NOTE — Telephone Encounter (Signed)
-----   Message from Arnoldo Lenis, MD sent at 06/01/2019  1:29 PM EDT ----- Normal stress test, no signs of blockages. Overall heart testing has looked fine, no clear cause of her symptoms detected. Needs to f/u with pcp to consider other causes, f/u with [place] 3 months in clinic   Zandra Abts MD

## 2019-06-01 NOTE — Telephone Encounter (Signed)
Pt voiced understanding - 3 month f/u scheduled  

## 2019-06-04 NOTE — Patient Instructions (Signed)
  Blood work was ordered.     Medications reviewed and updated.  Changes include :     Your prescription(s) have been submitted to your pharmacy. Please take as directed and contact our office if you believe you are having problem(s) with the medication(s).  A referral was ordered for        Someone will call you to schedule this.    Please followup in 6 months   

## 2019-06-04 NOTE — Progress Notes (Signed)
Subjective:    Patient ID: Angela Simmons, female    DOB: Oct 12, 1945, 74 y.o.   MRN: [id number]  HPI The patient is here for follow up of their chronic medical problems, including htn, DM, hyperlipidemia, gerd.  She is taking all of her medications as prescribed.   She is exercising regularly.      a1c  dexa foot   Medications and allergies reviewed with patient and updated if appropriate.  Patient Active Problem List   Diagnosis Date Noted  . DOE (dyspnea on exertion) 04/03/2019  . Ankle swelling, left 04/03/2019  . Hyperpigmented skin lesion 04/03/2019  . Poor balance 12/06/2018  . Headache 12/06/2018  . Vertigo 06/07/2018  . Urinary frequency 06/07/2018  . Lower abdominal pain 06/07/2018  . Herpes zoster without complication 43/83/8184  . Tingling in extremities 11/26/2017  . Ischial bursitis of left side 06/02/2017  . Sicca syndrome ([place]) 06/23/2016  . Bilateral leg edema 04/07/2016  . Burning sensation of skin 04/07/2016  . PMR (polymyalgia rheumatica) (HCC) 03/27/2016  . Osteopenia 03/04/2016  . Autoimmune disease ([place]) 03/18/2015  . Inflammatory arthritis 03/18/2015  . Joint pain 11/22/2014  . Overweight (BMI 25.0-29.9) 08/18/2014  . Low back pain with radiation 08/18/2014  . Hyperlipidemia 06/28/2014  . Chest pain 04/11/2014  . Tendonitis of shoulder 08/29/2013  . S/P right mastectomy 11/14/2012  . Type 2 diabetes, controlled, with neuropathy ([place]) 10/05/2012  . Allergic rhinitis 01/26/2011  . GERD 11/10/2009  . INTERSTITIAL CYSTITIS 10/31/2008  . BENIGN NEOPLASM OF VULVA 02/01/2008  . Essential hypertension, benign 02/08/2007  . History of cancer of right breast 02/08/2007    Current Outpatient Medications on File Prior to Visit  Medication Sig Dispense Refill  . amLODipine (NORVASC) 5 MG tablet TAKE 1 TABLET BY MOUTH EVERY DAY 90 tablet 0  . aspirin (ADULT ASPIRIN EC LOW STRENGTH) 81 MG EC tablet Take 81 mg by mouth daily.     Marland Kitchen atorvastatin  (LIPITOR) 10 MG tablet TAKE 1 TABLET BY MOUTH DAILY 90 tablet 3  . benazepril (LOTENSIN) 40 MG tablet TAKE 1 TABLET BY MOUTH EVERY DAY 90 tablet 1  . Blood Glucose Monitoring Suppl (ONETOUCH VERIO) w/Device KIT USE TO TEST BLOOD SUGAR ONCE DAILY AS DIRECTED 1 kit 0  . calcium-vitamin D (OSCAL WITH D) 500-200 MG-UNIT tablet Take 1 tablet by mouth daily with breakfast.    . diclofenac sodium (VOLTAREN) 1 % GEL Apply 3 g to 3 large joints up to 3 times daily. 3 Tube 3  . fluticasone (FLONASE) 50 MCG/ACT nasal spray USE 2 SPRAYS IN EACH NOSTRIL EVERY DAY 48 g 0  . folic acid (FOLVITE) 037 MCG tablet Take 400 mcg by mouth daily.    Marland Kitchen glucose blood test strip USE TO TEST BLOOD SUGAR ONCE DAILY. E11.40 100 each 3  . hydroxychloroquine (PLAQUENIL) 200 MG tablet Take 1 tablet by mouth daily Monday-Friday only 60 tablet 0  . loratadine (CLARITIN) 10 MG tablet TAKE 1 TABLET BY MOUTH EVERY DAY (Patient taking differently: TAKE 1 TABLET BY MOUTH EVERY DAY PRN) 30 tablet 2  . Multiple Vitamin (MULTIVITAMIN) tablet Take 1 tablet by mouth daily.    . Omega-3 Fatty Acids (FISH OIL) 1000 MG CAPS Take 1 capsule by mouth daily.     Marland Kitchen omeprazole (PRILOSEC) 40 MG capsule TAKE ONE CAPSULE BY MOUTH DAILY 90 capsule 1  . ONETOUCH DELICA LANCETS 54H MISC USE TO TEST ONCE DAILY 100 each 2  . ONETOUCH VERIO test  strip USE TO TEST BLOOD SUGAR ONCE DAILY 100 each 0  . triamcinolone ointment (KENALOG) 0.5 % Apply 1 application topically 2 (two) times daily. Do not use for more than 14 days in a row 15 g 0  . UNABLE TO FIND Over the counter Healthy Feet & Nerves     No current facility-administered medications on file prior to visit.    Past Medical History:  Diagnosis Date  . Breast cancer ([place]) 1990s   right breast  . Collagen vascular disease ([place])   . Diabetes mellitus without complication ([place])   . GERD (gastroesophageal reflux disease)   . HTN (hypertension)   . Hypercholesterolemia   . Obesity   .  Osteoarthritis     Past Surgical History:  Procedure Laterality Date  . ABDOMINAL HY[place]CTOMY  [place]   [place]roids  . COLONOSCOPY  May 2003   Dr. Tamala Julian: normal  . COLONOSCOPY  06/22/2011   Procedure: COLONOSCOPY;  Surgeon: Danie Binder, MD;  Location[facility];  Service: Endoscopy;  Laterality: N/A;  10:30  . MASTECTOMY  1995   right     Social History   Socioeconomic History  . Marital status: Married    Spouse name: Not on file  . Number of children: 3  . Years of education: 7  . Highest education level: Not on file  Occupational History  . Not on file  Tobacco Use  . Smoking status: Never Smoker  . Smokeless tobacco: Never Used  Substance and Sexual Activity  . Alcohol use: No  . Drug use: Never  . Sexual activity: Never    Birth control/protection: Rhythm  Other Topics Concern  . Not on file  Social History Narrative   Lives at home with husband   Caffeine, maybe 1 a day   Social Determinants of Health   Financial Resource Strain:   . Difficulty of Paying Living Expenses:   Food Insecurity:   . Worried About Charity fundraiser in the Last Year:   . Arboriculturist in the Last Year:   Transportation Needs:   . Film/video editor (Medical):   Marland Kitchen Lack of Transportation (Non-Medical):   Physical Activity:   . Days of Exercise per Week:   . Minutes of Exercise per Session:   Stress:   . Feeling of Stress :   Social Connections:   . Frequency of Communication with Friends and Family:   . Frequency of Social Gatherings with Friends and Family:   . Attends Religious Services:   . Active Member of Clubs or Organizations:   . Attends Archivist Meetings:   Marland Kitchen Marital Status:     Family History  Problem Relation Age of Onset  . Diabetes Mother        diabetic coma  . Heart failure Mother   . Diabetes Father   . Emphysema Father   . Diabetes Sister   . Hypercholesterolemia Sister   . Hypercholesterolemia Brother   . Diabetes Brother    . Hypertension Brother   . Hypercholesterolemia Brother   . Diabetes Sister   . Hypertension Sister   . Hypercholesterolemia Sister   . Hypercholesterolemia Sister   . Hypercholesterolemia Sister   . Diabetes Brother   . Hypertension Brother   . Colon cancer Neg Hx     Review of Systems     Objective:  There were no vitals filed for this visit. BP Readings from Last 3 Encounters:  05/01/19 Marland Kitchen)  145/87  04/20/19 (!) 148/78  04/03/19 (!) 162/88   Wt Readings from Last 3 Encounters:  05/01/19 198 lb 6.4 oz (90 kg)  04/20/19 196 lb (88.9 kg)  04/03/19 197 lb (89.4 kg)   There is no height or weight on file to calculate BMI.   Physical Exam    Constitutional: Appears well-developed and well-nourished. No distress.  HENT:  Head: Normocephalic and atraumatic.  Neck: Neck supple. No tracheal deviation present. No thyromegaly present.  No cervical lymphadenopathy Cardiovascular: Normal rate, regular rhythm and normal heart sounds.   No murmur heard. No carotid bruit .  No edema Pulmonary/Chest: Effort normal and breath sounds normal. No respiratory distress. No has no wheezes. No rales.  Skin: Skin is warm and dry. Not diaphoretic.  Psychiatric: Normal mood and affect. Behavior is normal.      Assessment & Plan:    See Problem List for Assessment and Plan of chronic medical problems.    This visit occurred during the SARS-CoV-2 public health emergency.  Safety protocols were in place, including screening questions prior to the visit, additional usage of staff PPE, and extensive cleaning of exam room while observing appropriate contact time as indicated for disinfecting solutions.    This encounter was created in error - please disregard.

## 2019-06-05 ENCOUNTER — Encounter: Payer: PPO | Admitting: Internal Medicine

## 2019-06-05 DIAGNOSIS — Z0289 Encounter for other administrative examinations: Secondary | ICD-10-CM

## 2019-06-08 DIAGNOSIS — L84 Corns and callosities: Secondary | ICD-10-CM | POA: Diagnosis not present

## 2019-06-08 DIAGNOSIS — B351 Tinea unguium: Secondary | ICD-10-CM | POA: Diagnosis not present

## 2019-06-08 DIAGNOSIS — M79676 Pain in unspecified toe(s): Secondary | ICD-10-CM | POA: Diagnosis not present

## 2019-06-08 DIAGNOSIS — E1142 Type 2 diabetes mellitus with diabetic polyneuropathy: Secondary | ICD-10-CM | POA: Diagnosis not present

## 2019-06-13 ENCOUNTER — Ambulatory Visit: Payer: PPO | Admitting: Cardiology

## 2019-06-20 ENCOUNTER — Telehealth: Payer: Self-pay | Admitting: Rheumatology

## 2019-06-20 NOTE — Progress Notes (Signed)
Subjective:    Patient ID: Angela Simmons, female    DOB: 1945/01/22, 74 y.o.   MRN: [id number]  HPI The patient is here for follow up of their chronic medical problems, including htn, DM, hyperlipidemia, gerd.  She is taking all of her medications as prescribed.   She is exercising regularly.   She walks 30 minutes 3 times a day.   She is having a lot of joint pain in her fingers, knees, legs, arms.  She follows up with dr Estanislado Pandy today.   She had a normal stress test one month ago.   The DOE is not related to her heart.  Her DOE is better  - she thinks the exercise is helping.    She has burning in her LLQ/groin region and she has frequent urination.  She denies dysuria.  She states the burning radiates from her left lower back.  She has been drinking a lot of cranberry juice.         Medications and allergies reviewed with patient and updated if appropriate.  Patient Active Problem List   Diagnosis Date Noted  . DOE (dyspnea on exertion) 04/03/2019  . Ankle swelling, left 04/03/2019  . Hyperpigmented skin lesion 04/03/2019  . Poor balance 12/06/2018  . Headache 12/06/2018  . Vertigo 06/07/2018  . Urinary frequency 06/07/2018  . Lower abdominal pain 06/07/2018  . Herpes zoster without complication 63/84/6659  . Tingling in extremities 11/26/2017  . Ischial bursitis of left side 06/02/2017  . Sicca syndrome ([place]) 06/23/2016  . Bilateral leg edema 04/07/2016  . Burning sensation of skin 04/07/2016  . PMR (polymyalgia rheumatica) (HCC) 03/27/2016  . Osteopenia 03/04/2016  . Autoimmune disease ([place]) 03/18/2015  . Inflammatory arthritis 03/18/2015  . Joint pain 11/22/2014  . Overweight (BMI 25.0-29.9) 08/18/2014  . Low back pain with radiation 08/18/2014  . Hyperlipidemia 06/28/2014  . Chest pain 04/11/2014  . Tendonitis of shoulder 08/29/2013  . S/P right mastectomy 11/14/2012  . Type 2 diabetes, controlled, with neuropathy ([place]) 10/05/2012  . Allergic rhinitis  01/26/2011  . GERD 11/10/2009  . INTERSTITIAL CYSTITIS 10/31/2008  . BENIGN NEOPLASM OF VULVA 02/01/2008  . Essential hypertension, benign 02/08/2007  . History of cancer of right breast 02/08/2007    Current Outpatient Medications on File Prior to Visit  Medication Sig Dispense Refill  . amLODipine (NORVASC) 5 MG tablet TAKE 1 TABLET BY MOUTH EVERY DAY 90 tablet 0  . aspirin (ADULT ASPIRIN EC LOW STRENGTH) 81 MG EC tablet Take 81 mg by mouth daily.     Marland Kitchen atorvastatin (LIPITOR) 10 MG tablet TAKE 1 TABLET BY MOUTH DAILY 90 tablet 3  . benazepril (LOTENSIN) 40 MG tablet TAKE 1 TABLET BY MOUTH EVERY DAY 90 tablet 1  . Blood Glucose Monitoring Suppl (ONETOUCH VERIO) w/Device KIT USE TO TEST BLOOD SUGAR ONCE DAILY AS DIRECTED 1 kit 0  . calcium-vitamin D (OSCAL WITH D) 500-200 MG-UNIT tablet Take 1 tablet by mouth daily with breakfast.    . diclofenac sodium (VOLTAREN) 1 % GEL Apply 3 g to 3 large joints up to 3 times daily. 3 Tube 3  . fluticasone (FLONASE) 50 MCG/ACT nasal spray USE 2 SPRAYS IN EACH NOSTRIL EVERY DAY 48 g 0  . folic acid (FOLVITE) 935 MCG tablet Take 400 mcg by mouth daily.    Marland Kitchen glucose blood test strip USE TO TEST BLOOD SUGAR ONCE DAILY. E11.40 100 each 3  . hydroxychloroquine (PLAQUENIL) 200 MG tablet Take 1 tablet  by mouth daily Monday-Friday only 60 tablet 0  . loratadine (CLARITIN) 10 MG tablet TAKE 1 TABLET BY MOUTH EVERY DAY (Patient taking differently: TAKE 1 TABLET BY MOUTH EVERY DAY PRN) 30 tablet 2  . Multiple Vitamin (MULTIVITAMIN) tablet Take 1 tablet by mouth daily.    . Omega-3 Fatty Acids (FISH OIL) 1000 MG CAPS Take 1 capsule by mouth daily.     Marland Kitchen omeprazole (PRILOSEC) 40 MG capsule TAKE ONE CAPSULE BY MOUTH DAILY 90 capsule 1  . ONETOUCH DELICA LANCETS 35T MISC USE TO TEST ONCE DAILY 100 each 2  . ONETOUCH VERIO test strip USE TO TEST BLOOD SUGAR ONCE DAILY 100 each 0  . triamcinolone ointment (KENALOG) 0.5 % Apply 1 application topically 2 (two) times  daily. Do not use for more than 14 days in a row 15 g 0  . UNABLE TO FIND Over the counter Healthy Feet & Nerves     No current facility-administered medications on file prior to visit.    Past Medical History:  Diagnosis Date  . Breast cancer ([place]) 1990s   right breast  . Collagen vascular disease ([place])   . Diabetes mellitus without complication ([place])   . GERD (gastroesophageal reflux disease)   . HTN (hypertension)   . Hypercholesterolemia   . Obesity   . Osteoarthritis     Past Surgical History:  Procedure Laterality Date  . ABDOMINAL HY[place]CTOMY  [place]   [place]ids  . COLONOSCOPY  May 2003   Dr. Tamala Julian: normal  . COLONOSCOPY  06/22/2011   Procedure: COLONOSCOPY;  Surgeon: Danie Binder, MD;  [facility];  Service: Endoscopy;  Laterality: N/A;  10:30  . MASTECTOMY  1995   right     Social History   Socioeconomic History  . Marital status: Married    Spouse name: Not on file  . Number of children: 3  . Years of education: 7  . Highest education level: Not on file  Occupational History  . Not on file  Tobacco Use  . Smoking status: Never Smoker  . Smokeless tobacco: Never Used  Substance and Sexual Activity  . Alcohol use: No  . Drug use: Never  . Sexual activity: Never    Birth control/protection: Rhythm  Other Topics Concern  . Not on file  Social History Narrative   Lives at home with husband   Caffeine, maybe 1 a day   Social Determinants of Health   Financial Resource Strain:   . Difficulty of Paying Living Expenses:   Food Insecurity:   . Worried About Charity fundraiser in the Last Year:   . Arboriculturist in the Last Year:   Transportation Needs:   . Film/video editor (Medical):   Marland Kitchen Lack of Transportation (Non-Medical):   Physical Activity:   . Days of Exercise per Week:   . Minutes of Exercise per Session:   Stress:   . Feeling of Stress :   Social Connections:   . Frequency of Communication with Friends and Family:    . Frequency of Social Gatherings with Friends and Family:   . Attends Religious Services:   . Active Member of Clubs or Organizations:   . Attends Archivist Meetings:   Marland Kitchen Marital Status:     Family History  Problem Relation Age of Onset  . Diabetes Mother        diabetic coma  . Heart failure Mother   . Diabetes Father   .  Emphysema Father   . Diabetes Sister   . Hypercholesterolemia Sister   . Hypercholesterolemia Brother   . Diabetes Brother   . Hypertension Brother   . Hypercholesterolemia Brother   . Diabetes Sister   . Hypertension Sister   . Hypercholesterolemia Sister   . Hypercholesterolemia Sister   . Hypercholesterolemia Sister   . Diabetes Brother   . Hypertension Brother   . Colon cancer Neg Hx     Review of Systems  Constitutional: Negative for fever.  Respiratory: Positive for cough ( alittle when laying down - couple of cough) and shortness of breath (better - with exertion, stress neg last month). Negative for wheezing.   Cardiovascular: Positive for chest pain (occ. stresss test neg last month), palpitations (occ) and leg swelling.  Gastrointestinal:       GERD 1-2 times a week  Neurological: Positive for headaches (occ). Negative for light-headedness.       Objective:   Vitals:   06/21/19 0844  BP: (!) 164/86  Pulse: 75  Resp: 16  Temp: 97.8 F (36.6 C)  SpO2: 99%   BP Readings from Last 3 Encounters:  06/21/19 (!) 164/86  05/01/19 (!) 145/87  04/20/19 (!) 148/78   Wt Readings from Last 3 Encounters:  06/21/19 195 lb (88.5 kg)  05/01/19 198 lb 6.4 oz (90 kg)  04/20/19 196 lb (88.9 kg)   Body mass index is 28.8 kg/m.   Physical Exam    Constitutional: Appears well-developed and well-nourished. No distress.  HENT:  Head: Normocephalic and atraumatic.  Neck: Neck supple. No tracheal deviation present. No thyromegaly present.  No cervical lymphadenopathy Cardiovascular: Normal rate, regular rhythm and normal heart  sounds.   No murmur heard. No carotid bruit .  No edema Pulmonary/Chest: Effort normal and breath sounds normal. No respiratory distress. No has no wheezes. No rales.  Abdomen:  Soft, NT, ND MSK:  Left groin non tender Skin: Skin is warm and dry. Not diaphoretic. Cyst left lower back - nontender Psychiatric: Normal mood and affect. Behavior is normal.   Diabetic Foot Exam - Simple   Simple Foot Form Diabetic Foot exam was performed with the following findings: Yes 06/21/2019  9:12 AM  Visual Inspection No deformities, no ulcerations, no other skin breakdown bilaterally: Yes Sensation Testing Pulse Check Posterior Tibialis and Dorsalis pulse intact bilaterally: Yes Comments Minimally Decreased sensation left plantar surface, normal sensation right foot       Assessment & Plan:    See Problem List for Assessment and Plan of chronic medical problems.    This visit occurred during the SARS-CoV-2 public health emergency.  Safety protocols were in place, including screening questions prior to the visit, additional usage of staff PPE, and extensive cleaning of exam room while observing appropriate contact time as indicated for disinfecting solutions.

## 2019-06-20 NOTE — Telephone Encounter (Signed)
Contacted patient and scheduled patient for 06/21/2019 at 10:40 am.

## 2019-06-20 NOTE — Telephone Encounter (Signed)
Patient states she is having increased joints pain in hands, shoulder hips, legs and in her great toe on left foot. Patient states she has been having swelling in her hands. Patient states she has been having the pain since Saturday 06/17/2019. Patient is taking PLQ one tablet Monday through Friday. Patient denies missing any doses. Please advise.

## 2019-06-20 NOTE — Telephone Encounter (Signed)
Please schedule an office visit to further evaluate her symptoms.

## 2019-06-20 NOTE — Progress Notes (Signed)
Office Visit Note  Patient: Angela Simmons             Date of Birth: [birth year]           MRN: [id number]             PCP: Binnie Rail, MD Referring: Binnie Rail, MD Visit Date: 06/21/2019 Occupation: @GUAROCC @  Subjective:  Pain in multiple joints   History of Present Illness: Angela Simmons is a 74 y.o. female with history of autoimmune disease and PMR.  She is taking Plaquenil 200 mg 1 tablet daily Monday through Friday.  She presents today with pain in multiple joints including both shoulders, both hands, and the left hip joint.  She has been having some aching in both knees but denies any joint swelling.  She started walking for exercise which has been helping with her overall joint stiffness.  Patient reports that on Sunday she was cleaning her bathtub and since then has had increased discomfort in her hands as well as her left hip.  She states the pain has been radiating into the left hip and is also been on the outside of her hip.  She has been taking Tylenol as needed for pain relief.  Patient reports that she has been more constipated recently and is unsure of this is contributing to some of her left hip discomfort.  Patient reports that she continues to have occasional oral ulcerations but denies any nasal ulcerations.  She denies any recent rashes or photosensitivity.  She has not had any symptoms of Raynaud's recently.  She continues to have chronic sicca symptoms.   Activities of Daily Living:  Patient reports morning stiffness for  30 minutes.   Patient Reports nocturnal pain.  Difficulty dressing/grooming: Denies Difficulty climbing stairs: Reports Difficulty getting out of chair: Denies Difficulty using hands for taps, buttons, cutlery, and/or writing: Reports  Review of Systems  Constitutional: Positive for fatigue.  HENT: Positive for mouth dryness and nose dryness. Negative for mouth sores.   Eyes: Positive for dryness. Negative for pain, itching and visual  disturbance.  Respiratory: Negative for cough, hemoptysis, shortness of breath and difficulty breathing.   Cardiovascular: Negative for palpitations, hypertension and swelling in legs/feet.  Gastrointestinal: Positive for constipation. Negative for blood in stool and diarrhea.  Endocrine: Negative for increased urination.  Genitourinary: Negative for difficulty urinating and painful urination.  Musculoskeletal: Positive for arthralgias, joint pain, joint swelling, myalgias, morning stiffness and myalgias. Negative for muscle weakness and muscle tenderness.  Skin: Negative for color change, pallor, rash, hair loss, nodules/bumps, redness, skin tightness, ulcers and sensitivity to sunlight.  Allergic/Immunologic: Negative for susceptible to infections.  Neurological: Negative for dizziness, memory loss and weakness.  Hematological: Negative for swollen glands.  Psychiatric/Behavioral: Negative for depressed mood, confusion and sleep disturbance. The patient is not nervous/anxious.     PMFS History:  Patient Active Problem List   Diagnosis Date Noted  . Burning sensation 06/21/2019  . DOE (dyspnea on exertion) 04/03/2019  . Ankle swelling, left 04/03/2019  . Hyperpigmented skin lesion 04/03/2019  . Poor balance 12/06/2018  . Headache 12/06/2018  . Vertigo 06/07/2018  . Urinary frequency 06/07/2018  . Lower abdominal pain 06/07/2018  . Herpes zoster without complication [id number]  . Tingling in extremities 11/26/2017  . Ischial bursitis of left side 06/02/2017  . Sicca syndrome ([place]) 06/23/2016  . Bilateral leg edema 04/07/2016  . Burning sensation of skin 04/07/2016  . PMR (polymyalgia rheumatica) (HCC) 03/27/2016  .  Osteopenia 03/04/2016  . Autoimmune disease ([place]) 03/18/2015  . Inflammatory arthritis 03/18/2015  . Joint pain 11/22/2014  . Overweight (BMI 25.0-29.9) 08/18/2014  . Low back pain with radiation 08/18/2014  . Hyperlipidemia 06/28/2014  . Chest pain 04/11/2014  .  Tendonitis of shoulder 08/29/2013  . S/P right mastectomy 11/14/2012  . Type 2 diabetes, controlled, with neuropathy ([place]) 10/05/2012  . Allergic rhinitis 01/26/2011  . GERD 11/10/2009  . INTERSTITIAL CYSTITIS 10/31/2008  . BENIGN NEOPLASM OF VULVA 02/01/2008  . Essential hypertension, benign 02/08/2007  . History of cancer of right breast 02/08/2007    Past Medical History:  Diagnosis Date  . Breast cancer ([place]) 1990s   right breast  . Collagen vascular disease ([place])   . Diabetes mellitus without complication ([place])   . GERD (gastroesophageal reflux disease)   . HTN (hypertension)   . Hypercholesterolemia   . Obesity   . Osteoarthritis     Family History  Problem Relation Age of Onset  . Diabetes Mother        diabetic coma  . Heart failure Mother   . Diabetes Father   . Emphysema Father   . Diabetes Sister   . Hypercholesterolemia Sister   . Hypercholesterolemia Brother   . Diabetes Brother   . Hypertension Brother   . Hypercholesterolemia Brother   . Diabetes Sister   . Hypertension Sister   . Hypercholesterolemia Sister   . Hypercholesterolemia Sister   . Hypercholesterolemia Sister   . Diabetes Brother   . Hypertension Brother   . Colon cancer Neg Hx    Past Surgical History:  Procedure Laterality Date  . ABDOMINAL HY[place]ERECTOMY  [place]   [place], [place]ids  . COLONOSCOPY  May 2003   Dr. Tamala Julian: normal  . COLONOSCOPY  06/22/2011   Procedure: COLONOSCOPY;  Surgeon: Danie Binder, MD;  [facility];  Service: Endoscopy;  Laterality: N/A;  10:30  . MASTECTOMY  1995   right    Social History   Social History Narrative   Lives at home with husband   Caffeine, maybe 1 a day   Immunization History  Administered Date(s) Administered  . Fluad Quad(high Dose 65+) 12/06/2018  . Influenza Split 11/14/2010  . Influenza Whole 10/18/2006, 10/31/2008, 11/06/2009  . Influenza, High Dose Seasonal PF 11/22/2014, 02/21/2016, 12/18/2016, 11/05/2017  .  Influenza,inj,Quad PF,6+ Mos 11/14/2012, 11/23/2013  . Moderna SARS-COVID-2 Vaccination 03/16/2019, 04/13/2019  . Pneumococcal Conjugate-13 04/11/2014  . Pneumococcal Polysaccharide-23 09/17/2011  . Td 07/05/2003  . Zoster 06/15/2013  . Zoster Recombinat (Shingrix) 11/30/2017, 02/02/2018     Objective: Vital Signs: BP 138/80 (BP Location: Left Arm, Patient Position: Sitting, Cuff Size: Normal)   Pulse 76   Resp 15   Ht 5\' 9"  (1.753 m)   Wt 196 lb 3.2 oz (89 kg)   BMI 28.97 kg/m    Physical Exam Vitals and nursing note reviewed.  Constitutional:      Appearance: She is well-developed.  HENT:     Head: Normocephalic and atraumatic.  Eyes:     Conjunctiva/sclera: Conjunctivae normal.  Pulmonary:     Effort: Pulmonary effort is normal.  Abdominal:     General: Bowel sounds are normal.     Palpations: Abdomen is soft.  Musculoskeletal:     Cervical back: Normal range of motion.  Lymphadenopathy:     Cervical: No cervical adenopathy.  Skin:    General: Skin is warm and dry.     Capillary Refill: Capillary refill takes less than  2 seconds.  Neurological:     Mental Status: She is alert and oriented to person, place, and time.  Psychiatric:        Behavior: Behavior normal.      Musculoskeletal Exam: C-spine, thoracic spine, and lumbar spine good ROM.  Shoulder joints, elbow joints, wrist joints, MCPs, PIPs, and DIPs good ROM with no synovitis. PIP and DIP thickening consistent with osteoarthritis of both hands.  Tenderness of bilateral 1st PIP joints.  Complete fist formation bilaterally.  Hip joints, knee joints, and ankle joints good ROM with no discomfort.  No warmth or effusion of knee joints.  No tenderness or swelling of ankle joints.  Tenderness over bilateral trochanteric bursa.    CDAI Exam: CDAI Score: -- Patient Global: --; Provider Global: -- Swollen: --; Tender: -- Joint Exam 06/21/2019   No joint exam has been documented for this visit   There is  currently no information documented on the homunculus. Go to the Rheumatology activity and complete the homunculus joint exam.  Investigation: No additional findings.  Imaging: NM Myocar Multi W/Spect W/Wall Motion / EF  Result Date: 05/26/2019  No diagnostic ST segment changes to indicate ischemia.  No significant myocardial perfusion defects to indicate scar or ischemia. There is apical breast attenuation artifact  This is a low risk study.  Nuclear stress EF: 72%.    XR HIP UNILAT W OR W/O PELVIS 2-3 VIEWS LEFT  Result Date: 06/21/2019 No significant hip joint narrowing was noted.  No SI joint narrowing was noted. Impression: Unremarkable x-ray of the hip joint.     Recent Labs: Lab Results  Component Value Date   WBC 2.8 (L) 05/01/2019   HGB 11.8 05/01/2019   PLT 231 05/01/2019   NA 138 06/21/2019   K 3.8 06/21/2019   CL 103 06/21/2019   CO2 29 06/21/2019   GLUCOSE 102 (H) 06/21/2019   BUN 11 06/21/2019   CREATININE 0.89 06/21/2019   BILITOT 0.5 06/21/2019   ALKPHOS 73 06/21/2019   AST 19 06/21/2019   ALT 19 06/21/2019   PROT 7.8 06/21/2019   ALBUMIN 4.2 06/21/2019   CALCIUM 9.5 06/21/2019   GFRAA 77 05/01/2019    Speciality Comments: PLQ EYe Exam: 01/09/2019 WNL @ [facility] [place] up in 1 year  Procedures:  No procedures performed Allergies: Levofloxacin, Nitrofurantoin, Penicillins, Sulfonamide derivatives, and Pantoprazole   Assessment / Plan:     Visit Diagnoses: Autoimmune disease ([place]) -  ANA+, sicca symptoms: She has not had any signs or symptoms of a flare recently.  She is clinically doing well on Plaquenil 200 mg 1 tablet by mouth daily Monday through Friday.  She has not missed any doses of Plaquenil and is tolerating it without any side effects.  She has no synovitis on examination today.  She continues to have migratory arthralgias likely due to underlying osteoarthritis.  She has not had any recent rashes, photosensitivity, symptoms of Raynaud's,  hair loss, or enlarged lymph nodes.  She continues to have chronic sicca symptoms and recurrent oral ulcerations.  She uses over-the-counter product for symptomatic relief.  She will continue taking Plaquenil 200 mg 1 tablet by mouth Monday through Friday.  She does not need a refill at this time.  She was advised to notify us if she develops any new or worsening symptoms.  We will repeat autoimmune lab work at her follow-up visit.  She has an upcoming appointment in November 2021.  High risk medication use - Plaquenil 200  mg 1 tablet by mouth daily Monday to Friday.  CMP was drawn today on 06/21/2019.  CBC was drawn on 05/01/2019.  PLQ Eye Exam: 01/09/2019 WNL @ [facility] [place] up in 1 year.   Sicca syndrome [facility]): She continues to have chronic sicca symptoms.  She uses over-the-counter products for symptomatic relief.  PMR (polymyalgia rheumatica) (HCC) - In remission. She is not having any increased myalgias, muscle tenderness, or muscle weakness.  She has no difficulty rising from a seated position or raising her arms above her head.  She is not exhibiting any signs of a flare.  Paresthesia of both hands: Resolved.  Other fatigue: Chronic but stable.   Trochanteric bursitis of both hips: She has tenderness to palpation over bilateral trochanteric bursa.  Different treatment options were discussed today.  She was given a handout of exercises to perform on a daily basis.  We discussed that if she continues to have persistent discomfort we will place a referral to physical therapy.  If her discomfort persists she can schedule a cortisone injection in the future.  Pain in left hip -She presents today with discomfort in the left hip which started 3 days ago.  According to the patient she was cleaning her bathtub on Sunday and started having increased discomfort during that time.  She states the pain has been radiating into the groin.  She has tenderness palpation over the left trochanteric bursa.   She has good range of motion of the left hip joint.  X-rays of the left hip were obtained today which were unremarkable.  We discussed performing stretching exercises on a daily basis.  If her discomfort persists or worsens we will refer her to physical therapy and/or perform a cortisone injection.  She was advised to notify us if her symptoms persist or worsen.  Plan: XR HIP UNILAT W OR W/O PELVIS 2-3 VIEWS LEFT  Osteopenia of multiple sites - Followed by Dr. Quay Burow.  She has an upcoming DEXA on 07/25/19. She takes calcium and vitamin D supplements as recommended.    Other medical conditions are listed as follows:   S/P right mastectomy  History of peripheral neuropathy  History of gastroesophageal reflux (GERD)  History of hypertension  History of cancer of right breast  History of hyperlipidemia    Orders: Orders Placed This Encounter  Procedures  . XR HIP UNILAT W OR W/O PELVIS 2-3 VIEWS LEFT   No orders of the defined types were placed in this encounter.     Follow-Up Instructions: Return in about 5 months (around 11/21/2019) for Autoimmune Disease, Polymyalgia Rheumatica.   Ofilia Neas, PA-C  Note - This record has been created using Dragon software.  Chart creation errors have been sought, but may not always  have been located. Such creation errors do not reflect on  the standard of medical care.

## 2019-06-20 NOTE — Patient Instructions (Addendum)
°  Blood work was ordered.   ° ° °Medications reviewed and updated.  Changes include :   none ° ° ° °Please followup in 6 months ° ° °

## 2019-06-20 NOTE — Telephone Encounter (Signed)
Patient called stating she is experiencing pain in her fingers, legs, hips, and left foot.  Patient is requesting "something for her joint pain" to be sent to [facility].

## 2019-06-21 ENCOUNTER — Other Ambulatory Visit: Payer: Self-pay

## 2019-06-21 ENCOUNTER — Ambulatory Visit (INDEPENDENT_AMBULATORY_CARE_PROVIDER_SITE_OTHER): Payer: PPO | Admitting: Internal Medicine

## 2019-06-21 ENCOUNTER — Encounter: Payer: Self-pay | Admitting: Internal Medicine

## 2019-06-21 ENCOUNTER — Ambulatory Visit: Payer: PPO | Admitting: Physician Assistant

## 2019-06-21 ENCOUNTER — Ambulatory Visit: Payer: Self-pay

## 2019-06-21 ENCOUNTER — Encounter: Payer: Self-pay | Admitting: Physician Assistant

## 2019-06-21 VITALS — BP 164/86 | HR 75 | Temp 97.8°F | Resp 16 | Ht 69.0 in | Wt 195.0 lb

## 2019-06-21 VITALS — BP 138/80 | HR 76 | Resp 15 | Ht 69.0 in | Wt 196.2 lb

## 2019-06-21 DIAGNOSIS — R208 Other disturbances of skin sensation: Secondary | ICD-10-CM | POA: Diagnosis not present

## 2019-06-21 DIAGNOSIS — R350 Frequency of micturition: Secondary | ICD-10-CM

## 2019-06-21 DIAGNOSIS — M353 Polymyalgia rheumatica: Secondary | ICD-10-CM | POA: Diagnosis not present

## 2019-06-21 DIAGNOSIS — Z79899 Other long term (current) drug therapy: Secondary | ICD-10-CM | POA: Diagnosis not present

## 2019-06-21 DIAGNOSIS — Z8639 Personal history of other endocrine, nutritional and metabolic disease: Secondary | ICD-10-CM

## 2019-06-21 DIAGNOSIS — R202 Paresthesia of skin: Secondary | ICD-10-CM

## 2019-06-21 DIAGNOSIS — Z8719 Personal history of other diseases of the digestive system: Secondary | ICD-10-CM

## 2019-06-21 DIAGNOSIS — M3500 Sicca syndrome, unspecified: Secondary | ICD-10-CM | POA: Diagnosis not present

## 2019-06-21 DIAGNOSIS — E1140 Type 2 diabetes mellitus with diabetic neuropathy, unspecified: Secondary | ICD-10-CM | POA: Diagnosis not present

## 2019-06-21 DIAGNOSIS — Z8679 Personal history of other diseases of the circulatory system: Secondary | ICD-10-CM | POA: Diagnosis not present

## 2019-06-21 DIAGNOSIS — R5383 Other fatigue: Secondary | ICD-10-CM | POA: Diagnosis not present

## 2019-06-21 DIAGNOSIS — Z8669 Personal history of other diseases of the nervous system and sense organs: Secondary | ICD-10-CM

## 2019-06-21 DIAGNOSIS — M7061 Trochanteric bursitis, right hip: Secondary | ICD-10-CM

## 2019-06-21 DIAGNOSIS — M25552 Pain in left hip: Secondary | ICD-10-CM | POA: Diagnosis not present

## 2019-06-21 DIAGNOSIS — M85852 Other specified disorders of bone density and structure, left thigh: Secondary | ICD-10-CM | POA: Diagnosis not present

## 2019-06-21 DIAGNOSIS — E7849 Other hyperlipidemia: Secondary | ICD-10-CM | POA: Diagnosis not present

## 2019-06-21 DIAGNOSIS — M7062 Trochanteric bursitis, left hip: Secondary | ICD-10-CM

## 2019-06-21 DIAGNOSIS — M359 Systemic involvement of connective tissue, unspecified: Secondary | ICD-10-CM | POA: Diagnosis not present

## 2019-06-21 DIAGNOSIS — M8589 Other specified disorders of bone density and structure, multiple sites: Secondary | ICD-10-CM

## 2019-06-21 DIAGNOSIS — I10 Essential (primary) hypertension: Secondary | ICD-10-CM

## 2019-06-21 DIAGNOSIS — Z9011 Acquired absence of right breast and nipple: Secondary | ICD-10-CM | POA: Diagnosis not present

## 2019-06-21 DIAGNOSIS — Z853 Personal history of malignant neoplasm of breast: Secondary | ICD-10-CM

## 2019-06-21 LAB — URINALYSIS, ROUTINE W REFLEX MICROSCOPIC
Bilirubin Urine: NEGATIVE
Hgb urine dipstick: NEGATIVE
Ketones, ur: NEGATIVE
Nitrite: NEGATIVE
RBC / HPF: NONE SEEN (ref 0–?)
Specific Gravity, Urine: 1.015 (ref 1.000–1.030)
Total Protein, Urine: NEGATIVE
Urine Glucose: NEGATIVE
Urobilinogen, UA: 0.2 (ref 0.0–1.0)
pH: 7.5 (ref 5.0–8.0)

## 2019-06-21 LAB — COMPREHENSIVE METABOLIC PANEL
ALT: 19 U/L (ref 0–35)
AST: 19 U/L (ref 0–37)
Albumin: 4.2 g/dL (ref 3.5–5.2)
Alkaline Phosphatase: 73 U/L (ref 39–117)
BUN: 11 mg/dL (ref 6–23)
CO2: 29 mEq/L (ref 19–32)
Calcium: 9.5 mg/dL (ref 8.4–10.5)
Chloride: 103 mEq/L (ref 96–112)
Creatinine, Ser: 0.89 mg/dL (ref 0.40–1.20)
GFR: 75.21 mL/min (ref >60.00)
Glucose, Bld: 102 mg/dL — ABNORMAL HIGH (ref 70–99)
Potassium: 3.8 mEq/L (ref 3.5–5.1)
Sodium: 138 mEq/L (ref 135–145)
Total Bilirubin: 0.5 mg/dL (ref 0.2–1.2)
Total Protein: 7.8 g/dL (ref 6.0–8.3)

## 2019-06-21 LAB — LIPID PANEL
Cholesterol: 142 mg/dL (ref 0–200)
HDL: 51.4 mg/dL (ref >39.00)
LDL Cholesterol: 83 mg/dL (ref 0–99)
NonHDL: 90.65
Total CHOL/HDL Ratio: 3
Triglycerides: 38 mg/dL (ref 0.0–149.0)
VLDL: 7.6 mg/dL (ref 0.0–40.0)

## 2019-06-21 LAB — HEMOGLOBIN A1C: Hgb A1c MFr Bld: 6 % (ref 4.6–6.5)

## 2019-06-21 NOTE — Assessment & Plan Note (Signed)
Chronic Mild osteopenia dexa due- ordered Continue regular walking Taking calcium, vitamin d

## 2019-06-21 NOTE — Assessment & Plan Note (Signed)
Acute Burning sensation in left groin - radiating from left lower back - possible radiculopathy Discussed less likely UTI given it is more in the groin and not LLQ - abdomen non-tender R/o UTI Can consider gabapentin

## 2019-06-21 NOTE — Assessment & Plan Note (Signed)
Chronic Diet controlled Check a1c Low sugar / carb diet Stressed regular exercise

## 2019-06-21 NOTE — Patient Instructions (Signed)
Hip Bursitis Rehab Ask your health care provider which exercises are safe for you. Do exercises exactly as told by your health care provider and adjust them as directed. It is normal to feel mild stretching, pulling, tightness, or discomfort as you do these exercises. Stop right away if you feel sudden pain or your pain gets worse. Do not begin these exercises until told by your health care provider. Stretching exercise This exercise warms up your muscles and joints and improves the movement and flexibility of your hip. This exercise also helps to relieve pain and stiffness. Iliotibial band stretch An iliotibial band is a strong band of muscle tissue that runs from the outer side of your hip to the outer side of your thigh and knee. 1. Lie on your side with your left / right leg in the top position. 2. Bend your left / right knee and grab your ankle. Stretch out your bottom arm to help you balance. 3. Slowly bring your knee back so your thigh is behind your body. 4. Slowly lower your knee toward the floor until you feel a gentle stretch on the outside of your left / right thigh. If you do not feel a stretch and your knee will not fall farther, place the heel of your other foot on top of your knee and pull your knee down toward the floor with your foot. 5. Hold this position for __________ seconds. 6. Slowly return to the starting position. Repeat __________ times. Complete this exercise __________ times a day. Strengthening exercises These exercises build strength and endurance in your hip and pelvis. Endurance is the ability to use your muscles for a long time, even after they get tired. Bridge This exercise strengthens the muscles that move your thigh backward (hip extensors). 1. Lie on your back on a firm surface with your knees bent and your feet flat on the floor. 2. Tighten your buttocks muscles and lift your buttocks off the floor until your trunk is level with your thighs. ? Do not arch  your back. ? You should feel the muscles working in your buttocks and the back of your thighs. If you do not feel these muscles, slide your feet 1-2 inches (2.5-5 cm) farther away from your buttocks. ? If this exercise is too easy, try doing it with your arms crossed over your chest. 3. Hold this position for __________ seconds. 4. Slowly lower your hips to the starting position. 5. Let your muscles relax completely after each repetition. Repeat __________ times. Complete this exercise __________ times a day. Squats This exercise strengthens the muscles in front of your thigh and knee (quadriceps). 1. Stand in front of a table, with your feet and knees pointing straight ahead. You may rest your hands on the table for balance but not for support. 2. Slowly bend your knees and lower your hips like you are going to sit in a chair. ? Keep your weight over your heels, not over your toes. ? Keep your lower legs upright so they are parallel with the table legs. ? Do not let your hips go lower than your knees. ? Do not bend lower than told by your health care provider. ? If your hip pain increases, do not bend as low. 3. Hold the squat position for __________ seconds. 4. Slowly push with your legs to return to standing. Do not use your hands to pull yourself to standing. Repeat __________ times. Complete this exercise __________ times a day. Hip hike 1. Stand   sideways on a bottom step. Stand on your left / right leg with your other foot unsupported next to the step. You can hold on to the railing or wall for balance if needed. 2. Keep your knees straight and your torso square. Then lift your left / right hip up toward the ceiling. 3. Hold this position for __________ seconds. 4. Slowly let your left / right hip lower toward the floor, past the starting position. Your foot should get closer to the floor. Do not lean or bend your knees. Repeat __________ times. Complete this exercise __________ times a  day. Single leg stand 1. Without shoes, stand near a railing or in a doorway. You may hold on to the railing or door frame as needed for balance. 2. Squeeze your left / right buttock muscles, then lift up your other foot. ? Do not let your left / right hip push out to the side. ? It is helpful to stand in front of a mirror for this exercise so you can watch your hip. 3. Hold this position for __________ seconds. Repeat __________ times. Complete this exercise __________ times a day. This information is not intended to replace advice given to you by your health care provider. Make sure you discuss any questions you have with your health care provider. Document Revised: 05/02/2018 Document Reviewed: 05/02/2018 [employer] Patient Education  2020 [employer] Hand Exercises Hand exercises can be helpful for almost anyone. These exercises can strengthen the hands, improve flexibility and movement, and increase blood flow to the hands. These results can make work and daily tasks easier. Hand exercises can be especially helpful for people who have joint pain from arthritis or have nerve damage from overuse (carpal tunnel syndrome). These exercises can also help people who have injured a hand. Exercises Most of these hand exercises are gentle stretching and motion exercises. It is usually safe to do them often throughout the day. Warming up your hands before exercise may help to reduce stiffness. You can do this with gentle massage or by placing your hands in warm water for 10-15 minutes. It is normal to feel some stretching, pulling, tightness, or mild discomfort as you begin new exercises. This will gradually improve. Stop an exercise right away if you feel sudden, severe pain or your pain gets worse. Ask your health care provider which exercises are best for you. Knuckle bend or "claw" fist 1. Stand or sit with your arm, hand, and all five fingers pointed straight up. Make sure to keep your wrist  straight during the exercise. 2. Gently bend your fingers down toward your palm until the tips of your fingers are touching the top of your palm. Keep your big knuckle straight and just bend the small knuckles in your fingers. 3. Hold this position for __________ seconds. 4. Straighten (extend) your fingers back to the starting position. Repeat this exercise 5-10 times with each hand. Full finger fist 1. Stand or sit with your arm, hand, and all five fingers pointed straight up. Make sure to keep your wrist straight during the exercise. 2. Gently bend your fingers into your palm until the tips of your fingers are touching the middle of your palm. 3. Hold this position for __________ seconds. 4. Extend your fingers back to the starting position, stretching every joint fully. Repeat this exercise 5-10 times with each hand. Straight fist 1. Stand or sit with your arm, hand, and all five fingers pointed straight up. Make sure to keep your wrist   straight during the exercise. 2. Gently bend your fingers at the big knuckle, where your fingers meet your hand, and the middle knuckle. Keep the knuckle at the tips of your fingers straight and try to touch the bottom of your palm. 3. Hold this position for __________ seconds. 4. Extend your fingers back to the starting position, stretching every joint fully. Repeat this exercise 5-10 times with each hand. Tabletop 1. Stand or sit with your arm, hand, and all five fingers pointed straight up. Make sure to keep your wrist straight during the exercise. 2. Gently bend your fingers at the big knuckle, where your fingers meet your hand, as far down as you can while keeping the small knuckles in your fingers straight. Think of forming a tabletop with your fingers. 3. Hold this position for __________ seconds. 4. Extend your fingers back to the starting position, stretching every joint fully. Repeat this exercise 5-10 times with each hand. Finger spread 1. Place  your hand flat on a table with your palm facing down. Make sure your wrist stays straight as you do this exercise. 2. Spread your fingers and thumb apart from each other as far as you can until you feel a gentle stretch. Hold this position for __________ seconds. 3. Bring your fingers and thumb tight together again. Hold this position for __________ seconds. Repeat this exercise 5-10 times with each hand. Making circles 1. Stand or sit with your arm, hand, and all five fingers pointed straight up. Make sure to keep your wrist straight during the exercise. 2. Make a circle by touching the tip of your thumb to the tip of your index finger. 3. Hold for __________ seconds. Then open your hand wide. 4. Repeat this motion with your thumb and each finger on your hand. Repeat this exercise 5-10 times with each hand. Thumb motion 1. Sit with your forearm resting on a table and your wrist straight. Your thumb should be facing up toward the ceiling. Keep your fingers relaxed as you move your thumb. 2. Lift your thumb up as high as you can toward the ceiling. Hold for __________ seconds. 3. Bend your thumb across your palm as far as you can, reaching the tip of your thumb for the small finger (pinkie) side of your palm. Hold for __________ seconds. Repeat this exercise 5-10 times with each hand. Grip strengthening  1. Hold a stress ball or other soft ball in the middle of your hand. 2. Slowly increase the pressure, squeezing the ball as much as you can without causing pain. Think of bringing the tips of your fingers into the middle of your palm. All of your finger joints should bend when doing this exercise. 3. Hold your squeeze for __________ seconds, then relax. Repeat this exercise 5-10 times with each hand. Contact a health care provider if:  Your hand pain or discomfort gets much worse when you do an exercise.  Your hand pain or discomfort does not improve within 2 hours after you exercise. If you  have any of these problems, stop doing these exercises right away. Do not do them again unless your health care provider says that you can. Get help right away if:  You develop sudden, severe hand pain or swelling. If this happens, stop doing these exercises right away. Do not do them again unless your health care provider says that you can. This information is not intended to replace advice given to you by your health care provider. Make sure you discuss   any questions you have with your health care provider. Document Revised: 04/28/2018 Document Reviewed: 01/06/2018 Elsevier Patient Education  2020 [employer]  

## 2019-06-21 NOTE — Assessment & Plan Note (Signed)
Chronic BP ? Controlled - elevated here, but she monitors it at home and she feels it is well controlled - she forgot to bring her BP log Current regimen effective and well tolerated Continue current medications at current doses Advised bringing her cuff to her next doctor's appt to make sure her cuff is accurate cmp

## 2019-06-21 NOTE — Assessment & Plan Note (Signed)
Urinary frequency, burning sensation that seems to be more msk in nature Will check UA UCx to r/o UTI

## 2019-06-21 NOTE — Assessment & Plan Note (Signed)
Chronic Check lipid panel  Continue daily statin Regular exercise and healthy diet encouraged  

## 2019-06-22 ENCOUNTER — Telehealth: Payer: Self-pay | Admitting: Internal Medicine

## 2019-06-22 LAB — URINE CULTURE: Result:: NO GROWTH

## 2019-06-22 NOTE — Progress Notes (Signed)
°  Chronic Care Management   Note  06/22/2019 Name: Angela Simmons MRN: [id number] DOB: Jun 21, 1945  Angela Simmons is a 74 y.o. year old female who is a primary care patient of Burns, Claudina Lick, MD. I reached out to Hinton Lovely by phone today in response to a referral sent by [name] PCP, Binnie Rail, MD.   [name] was given information about Chronic Care Management services today including:  1. CCM service includes personalized support from designated clinical staff supervised by her physician, including individualized plan of care and coordination with other care providers 2. 24/7 contact phone numbers for assistance for urgent and routine care needs. 3. Service will only be billed when office clinical staff spend 20 minutes or more in a month to coordinate care. 4. Only one practitioner may furnish and bill the service in a calendar month. 5. The patient may stop CCM services at any time (effective at the end of the month) by phone call to the office staff.   Patient agreed to services and verbal consent obtained.   This note is not being shared with the patient for the following reason: To respect privacy (The patient or proxy has requested that the information not be shared). Follow up plan:   [facility]

## 2019-06-27 DIAGNOSIS — C50911 Malignant neoplasm of unspecified site of right female breast: Secondary | ICD-10-CM | POA: Diagnosis not present

## 2019-07-02 ENCOUNTER — Other Ambulatory Visit: Payer: Self-pay | Admitting: Internal Medicine

## 2019-07-06 ENCOUNTER — Other Ambulatory Visit: Payer: Self-pay | Admitting: Rheumatology

## 2019-07-06 NOTE — Telephone Encounter (Signed)
Last Visit: 06/21/2019 Next Visit: 11/22/2019 Labs: CBC 05/01/2019: White cell count is low, CMP 06/21/2019 Glucose 102 Eye exam: 01/09/2019 WNL   Current Dose per office note 06/21/2019: Plaquenil200 mg1 tabletby mouth dailyMonday to Friday   Okay to refill Plaquenil? 

## 2019-07-10 DIAGNOSIS — Z79899 Other long term (current) drug therapy: Secondary | ICD-10-CM | POA: Diagnosis not present

## 2019-07-10 DIAGNOSIS — H40013 Open angle with borderline findings, low risk, bilateral: Secondary | ICD-10-CM | POA: Diagnosis not present

## 2019-07-10 LAB — HM DIABETES EYE EXAM

## 2019-07-13 ENCOUNTER — Other Ambulatory Visit: Payer: Self-pay | Admitting: Internal Medicine

## 2019-07-18 ENCOUNTER — Telehealth: Payer: Self-pay | Admitting: Internal Medicine

## 2019-07-18 NOTE — Telephone Encounter (Signed)
BP looks good  - no changes needed

## 2019-07-18 NOTE — Telephone Encounter (Signed)
Spoke with patient and info given 

## 2019-07-18 NOTE — Telephone Encounter (Signed)
New message:   BP Readings:  07/17/19-125/86 07/18/19-143/80  Pt states she was asked to call in with some BP readings. She states she has more for the month if you would like those as well.

## 2019-07-20 ENCOUNTER — Telehealth: Payer: Self-pay | Admitting: *Deleted

## 2019-07-20 DIAGNOSIS — K2100 Gastro-esophageal reflux disease with esophagitis, without bleeding: Secondary | ICD-10-CM | POA: Diagnosis not present

## 2019-07-20 DIAGNOSIS — I10 Essential (primary) hypertension: Secondary | ICD-10-CM | POA: Diagnosis not present

## 2019-07-20 DIAGNOSIS — R531 Weakness: Secondary | ICD-10-CM | POA: Diagnosis not present

## 2019-07-20 DIAGNOSIS — J90 Pleural effusion, not elsewhere classified: Secondary | ICD-10-CM | POA: Diagnosis not present

## 2019-07-20 DIAGNOSIS — R079 Chest pain, unspecified: Secondary | ICD-10-CM | POA: Diagnosis not present

## 2019-07-20 DIAGNOSIS — R7303 Prediabetes: Secondary | ICD-10-CM | POA: Diagnosis not present

## 2019-07-20 DIAGNOSIS — R42 Dizziness and giddiness: Secondary | ICD-10-CM | POA: Diagnosis not present

## 2019-07-20 DIAGNOSIS — E785 Hyperlipidemia, unspecified: Secondary | ICD-10-CM | POA: Diagnosis not present

## 2019-07-20 DIAGNOSIS — R0602 Shortness of breath: Secondary | ICD-10-CM | POA: Diagnosis not present

## 2019-07-20 NOTE — Telephone Encounter (Signed)
Reports active aching, constant chest pain in the center of chest rated 8/10. Also reports SOB, dizziness and fatigue. Denies cough, congestion, fever, n/v or swelling. Reports these symptoms started last night but eased up but now has returned. Advised that she needed to go to the ED now for an evaluation. Verbalized understanding of plan.

## 2019-07-21 ENCOUNTER — Telehealth: Payer: Self-pay | Admitting: Internal Medicine

## 2019-07-21 NOTE — Telephone Encounter (Signed)
    Daughter requesting call back from [facility] to discuss details

## 2019-07-21 NOTE — Telephone Encounter (Signed)
Spoke with patient's daughter. She went to the ER at [facility]. She states they did 2 EKGs, lots of labs and ruled noted no heart-attack. They did treat her for Acid reflux. Patient's daughter stated she wasn't able to go inside with mom yesterday but would follow up with her today to see if any referrals were made. I did offer a hospital follow up appointment. She was going to call back to schedule if needed.

## 2019-07-21 NOTE — Telephone Encounter (Signed)
New message:   Pt's daughter is calling and states the pt had to be taken to the emergency room yesterday due to having some severe chest pain. The daughter would like to know what steps they need to take now. Please advise.

## 2019-07-22 ENCOUNTER — Inpatient Hospital Stay (HOSPITAL_COMMUNITY)
Admission: EM | Admit: 2019-07-22 | Discharge: 2019-07-25 | DRG: 194 | Disposition: A | Discharge status: Home / Self Care | Payer: PPO | Attending: Family Medicine | Admitting: Family Medicine

## 2019-07-22 ENCOUNTER — Inpatient Hospital Stay (HOSPITAL_COMMUNITY): Payer: PPO

## 2019-07-22 ENCOUNTER — Encounter (HOSPITAL_COMMUNITY): Payer: Self-pay | Admitting: Emergency Medicine

## 2019-07-22 ENCOUNTER — Other Ambulatory Visit: Payer: Self-pay

## 2019-07-22 ENCOUNTER — Emergency Department (HOSPITAL_COMMUNITY): Payer: PPO

## 2019-07-22 DIAGNOSIS — I119 Hypertensive heart disease without heart failure: Secondary | ICD-10-CM | POA: Diagnosis present

## 2019-07-22 DIAGNOSIS — J301 Allergic rhinitis due to pollen: Secondary | ICD-10-CM | POA: Diagnosis not present

## 2019-07-22 DIAGNOSIS — R079 Chest pain, unspecified: Secondary | ICD-10-CM | POA: Diagnosis not present

## 2019-07-22 DIAGNOSIS — Z833 Family history of diabetes mellitus: Secondary | ICD-10-CM | POA: Diagnosis not present

## 2019-07-22 DIAGNOSIS — M353 Polymyalgia rheumatica: Secondary | ICD-10-CM | POA: Diagnosis present

## 2019-07-22 DIAGNOSIS — Z9011 Acquired absence of right breast and nipple: Secondary | ICD-10-CM | POA: Diagnosis not present

## 2019-07-22 DIAGNOSIS — Z79899 Other long term (current) drug therapy: Secondary | ICD-10-CM | POA: Diagnosis not present

## 2019-07-22 DIAGNOSIS — R0789 Other chest pain: Secondary | ICD-10-CM | POA: Diagnosis not present

## 2019-07-22 DIAGNOSIS — J309 Allergic rhinitis, unspecified: Secondary | ICD-10-CM | POA: Diagnosis not present

## 2019-07-22 DIAGNOSIS — Z880 Allergy status to penicillin: Secondary | ICD-10-CM | POA: Diagnosis not present

## 2019-07-22 DIAGNOSIS — Z6828 Body mass index (BMI) 28.0-28.9, adult: Secondary | ICD-10-CM | POA: Diagnosis not present

## 2019-07-22 DIAGNOSIS — Z8249 Family history of ischemic heart disease and other diseases of the circulatory system: Secondary | ICD-10-CM

## 2019-07-22 DIAGNOSIS — Z853 Personal history of malignant neoplasm of breast: Secondary | ICD-10-CM

## 2019-07-22 DIAGNOSIS — E663 Overweight: Secondary | ICD-10-CM | POA: Diagnosis not present

## 2019-07-22 DIAGNOSIS — I480 Paroxysmal atrial fibrillation: Secondary | ICD-10-CM | POA: Diagnosis present

## 2019-07-22 DIAGNOSIS — E1151 Type 2 diabetes mellitus with diabetic peripheral angiopathy without gangrene: Secondary | ICD-10-CM | POA: Diagnosis present

## 2019-07-22 DIAGNOSIS — I10 Essential (primary) hypertension: Secondary | ICD-10-CM | POA: Diagnosis present

## 2019-07-22 DIAGNOSIS — E1140 Type 2 diabetes mellitus with diabetic neuropathy, unspecified: Secondary | ICD-10-CM | POA: Diagnosis not present

## 2019-07-22 DIAGNOSIS — Z825 Family history of asthma and other chronic lower respiratory diseases: Secondary | ICD-10-CM

## 2019-07-22 DIAGNOSIS — I2510 Atherosclerotic heart disease of native coronary artery without angina pectoris: Secondary | ICD-10-CM | POA: Diagnosis not present

## 2019-07-22 DIAGNOSIS — K219 Gastro-esophageal reflux disease without esophagitis: Secondary | ICD-10-CM | POA: Diagnosis not present

## 2019-07-22 DIAGNOSIS — E7849 Other hyperlipidemia: Secondary | ICD-10-CM

## 2019-07-22 DIAGNOSIS — M1990 Unspecified osteoarthritis, unspecified site: Secondary | ICD-10-CM | POA: Diagnosis present

## 2019-07-22 DIAGNOSIS — Z7982 Long term (current) use of aspirin: Secondary | ICD-10-CM

## 2019-07-22 DIAGNOSIS — Z888 Allergy status to other drugs, medicaments and biological substances status: Secondary | ICD-10-CM | POA: Diagnosis not present

## 2019-07-22 DIAGNOSIS — R091 Pleurisy: Secondary | ICD-10-CM | POA: Diagnosis present

## 2019-07-22 DIAGNOSIS — Z20822 Contact with and (suspected) exposure to covid-19: Secondary | ICD-10-CM | POA: Diagnosis present

## 2019-07-22 DIAGNOSIS — E785 Hyperlipidemia, unspecified: Secondary | ICD-10-CM | POA: Diagnosis not present

## 2019-07-22 DIAGNOSIS — J189 Pneumonia, unspecified organism: Secondary | ICD-10-CM | POA: Diagnosis not present

## 2019-07-22 DIAGNOSIS — Z882 Allergy status to sulfonamides status: Secondary | ICD-10-CM | POA: Diagnosis not present

## 2019-07-22 DIAGNOSIS — I4892 Unspecified atrial flutter: Secondary | ICD-10-CM | POA: Diagnosis not present

## 2019-07-22 DIAGNOSIS — E1169 Type 2 diabetes mellitus with other specified complication: Secondary | ICD-10-CM | POA: Diagnosis present

## 2019-07-22 DIAGNOSIS — R0602 Shortness of breath: Secondary | ICD-10-CM | POA: Diagnosis not present

## 2019-07-22 DIAGNOSIS — Z83438 Family history of other disorder of lipoprotein metabolism and other lipidemia: Secondary | ICD-10-CM

## 2019-07-22 DIAGNOSIS — J90 Pleural effusion, not elsewhere classified: Secondary | ICD-10-CM | POA: Diagnosis not present

## 2019-07-22 DIAGNOSIS — I471 Supraventricular tachycardia: Secondary | ICD-10-CM | POA: Diagnosis not present

## 2019-07-22 DIAGNOSIS — R072 Precordial pain: Secondary | ICD-10-CM | POA: Diagnosis not present

## 2019-07-22 LAB — URINALYSIS, ROUTINE W REFLEX MICROSCOPIC
Bacteria, UA: NONE SEEN
Bilirubin Urine: NEGATIVE
Glucose, UA: NEGATIVE mg/dL
Hgb urine dipstick: NEGATIVE
Ketones, ur: NEGATIVE mg/dL
Nitrite: NEGATIVE
Protein, ur: NEGATIVE mg/dL
Specific Gravity, Urine: 1.01 (ref 1.005–1.030)
pH: 6 (ref 5.0–8.0)

## 2019-07-22 LAB — CBC
HCT: 40.1 % (ref 36.0–46.0)
Hemoglobin: 12.8 g/dL (ref 12.0–15.0)
MCH: 29.8 pg (ref 26.0–34.0)
MCHC: 31.9 g/dL (ref 30.0–36.0)
MCV: 93.5 fL (ref 80.0–100.0)
Platelets: 225 10*3/uL (ref 150–400)
RBC: 4.29 MIL/uL (ref 3.87–5.11)
RDW: 13.7 % (ref 11.5–15.5)
WBC: 10 10*3/uL (ref 4.0–10.5)
nRBC: 0 % (ref 0.0–0.2)

## 2019-07-22 LAB — BASIC METABOLIC PANEL
Anion gap: 12 (ref 5–15)
BUN: 15 mg/dL (ref 8–23)
CO2: 25 mmol/L (ref 22–32)
Calcium: 8.9 mg/dL (ref 8.9–10.3)
Chloride: 99 mmol/L (ref 98–111)
Creatinine, Ser: 1.17 mg/dL — ABNORMAL HIGH (ref 0.44–1.00)
GFR calc Af Amer: 54 mL/min — ABNORMAL LOW (ref >60)
GFR calc non Af Amer: 46 mL/min — ABNORMAL LOW (ref >60)
Glucose, Bld: 127 mg/dL — ABNORMAL HIGH (ref 70–99)
Potassium: 4.4 mmol/L (ref 3.5–5.1)
Sodium: 136 mmol/L (ref 135–145)

## 2019-07-22 LAB — CBG MONITORING, ED: Glucose-Capillary: 120 mg/dL — ABNORMAL HIGH (ref 70–99)

## 2019-07-22 LAB — GLUCOSE, CAPILLARY: Glucose-Capillary: 99 mg/dL (ref 70–99)

## 2019-07-22 LAB — SARS CORONAVIRUS 2 BY RT PCR (HOSPITAL ORDER, PERFORMED IN ~~LOC~~ HOSPITAL LAB): SARS Coronavirus 2: NEGATIVE

## 2019-07-22 LAB — D-DIMER, QUANTITATIVE: D-Dimer, Quant: 2.32 ug/mL-FEU — ABNORMAL HIGH (ref 0.00–0.50)

## 2019-07-22 LAB — BRAIN NATRIURETIC PEPTIDE: B Natriuretic Peptide: 74.3 pg/mL (ref 0.0–100.0)

## 2019-07-22 LAB — TROPONIN I (HIGH SENSITIVITY)
Troponin I (High Sensitivity): 4 ng/L (ref <18)
Troponin I (High Sensitivity): 6 ng/L (ref <18)

## 2019-07-22 MED ORDER — LORATADINE 10 MG PO TABS
10.0000 mg | ORAL_TABLET | Freq: Every day | ORAL | Status: DC
  Administered 2019-07-24: 10 mg via ORAL
  Filled 2019-07-22 (×3): qty 1

## 2019-07-22 MED ORDER — FOLIC ACID 1 MG PO TABS
1000.0000 ug | ORAL_TABLET | Freq: Every day | ORAL | Status: DC
  Administered 2019-07-23 – 2019-07-25 (×3): 1 mg via ORAL
  Filled 2019-07-22 (×3): qty 1

## 2019-07-22 MED ORDER — BENAZEPRIL HCL 10 MG PO TABS
40.0000 mg | ORAL_TABLET | Freq: Every day | ORAL | Status: DC
  Administered 2019-07-23 – 2019-07-25 (×3): 40 mg via ORAL
  Filled 2019-07-22 (×3): qty 4

## 2019-07-22 MED ORDER — SODIUM CHLORIDE 0.9 % IV SOLN: INTRAVENOUS | Status: DC

## 2019-07-22 MED ORDER — FLUTICASONE PROPIONATE 50 MCG/ACT NA SUSP
2.0000 | Freq: Every day | NASAL | Status: DC
  Filled 2019-07-22: qty 16

## 2019-07-22 MED ORDER — METOPROLOL TARTRATE 5 MG/5ML IV SOLN: 2.5000 mg | INTRAVENOUS | Status: DC | PRN

## 2019-07-22 MED ORDER — ENOXAPARIN SODIUM 40 MG/0.4ML ~~LOC~~ SOLN
40.0000 mg | Freq: Every day | SUBCUTANEOUS | Status: DC
  Administered 2019-07-22 – 2019-07-23 (×2): 40 mg via SUBCUTANEOUS
  Filled 2019-07-22 (×2): qty 0.4

## 2019-07-22 MED ORDER — HYDROXYCHLOROQUINE SULFATE 200 MG PO TABS
200.0000 mg | ORAL_TABLET | ORAL | Status: DC
  Administered 2019-07-24 – 2019-07-25 (×2): 200 mg via ORAL
  Filled 2019-07-22 (×2): qty 1

## 2019-07-22 MED ORDER — DICLOFENAC SODIUM 1 % EX GEL
2.0000 g | Freq: Four times a day (QID) | CUTANEOUS | Status: DC
  Administered 2019-07-22 – 2019-07-24 (×7): 2 g via TOPICAL
  Filled 2019-07-22: qty 100

## 2019-07-22 MED ORDER — CALCIUM CARBONATE-VITAMIN D 500-200 MG-UNIT PO TABS
1.0000 | ORAL_TABLET | Freq: Every day | ORAL | Status: DC
  Administered 2019-07-23 – 2019-07-25 (×3): 1 via ORAL
  Filled 2019-07-22 (×3): qty 1

## 2019-07-22 MED ORDER — AZITHROMYCIN 250 MG PO TABS
500.0000 mg | ORAL_TABLET | Freq: Every day | ORAL | Status: DC
  Administered 2019-07-23 – 2019-07-24 (×2): 500 mg via ORAL
  Filled 2019-07-22 (×2): qty 2

## 2019-07-22 MED ORDER — ONDANSETRON 4 MG PO TBDP: 4.0000 mg | ORAL_TABLET | Freq: Four times a day (QID) | ORAL | Status: DC | PRN

## 2019-07-22 MED ORDER — IOHEXOL 350 MG/ML SOLN
100.0000 mL | Freq: Once | INTRAVENOUS | Status: AC | PRN
  Administered 2019-07-22: 100 mL via INTRAVENOUS

## 2019-07-22 MED ORDER — SODIUM CHLORIDE 0.9 % IV SOLN
1.0000 g | Freq: Once | INTRAVENOUS | Status: AC
  Administered 2019-07-22: 1 g via INTRAVENOUS
  Filled 2019-07-22: qty 10

## 2019-07-22 MED ORDER — POLYETHYLENE GLYCOL 3350 17 G PO PACK: 17.0000 g | PACK | Freq: Every day | ORAL | Status: DC | PRN

## 2019-07-22 MED ORDER — SODIUM CHLORIDE 0.9% FLUSH: 3.0000 mL | Freq: Once | INTRAVENOUS | Status: DC

## 2019-07-22 MED ORDER — SODIUM CHLORIDE 0.9 % IV BOLUS
1000.0000 mL | Freq: Once | INTRAVENOUS | Status: AC
  Administered 2019-07-22: 1000 mL via INTRAVENOUS

## 2019-07-22 MED ORDER — ASPIRIN EC 81 MG PO TBEC
81.0000 mg | DELAYED_RELEASE_TABLET | Freq: Every day | ORAL | Status: DC
  Administered 2019-07-23 – 2019-07-25 (×3): 81 mg via ORAL
  Filled 2019-07-22 (×3): qty 1

## 2019-07-22 MED ORDER — SODIUM CHLORIDE 0.9 % IV SOLN
500.0000 mg | Freq: Once | INTRAVENOUS | Status: AC
  Administered 2019-07-22: 500 mg via INTRAVENOUS
  Filled 2019-07-22: qty 500

## 2019-07-22 MED ORDER — TEMAZEPAM 15 MG PO CAPS: 15.0000 mg | ORAL_CAPSULE | Freq: Every evening | ORAL | Status: DC | PRN

## 2019-07-22 MED ORDER — AMLODIPINE BESYLATE 5 MG PO TABS
5.0000 mg | ORAL_TABLET | Freq: Every day | ORAL | Status: DC
  Administered 2019-07-23 – 2019-07-25 (×3): 5 mg via ORAL
  Filled 2019-07-22 (×3): qty 1

## 2019-07-22 MED ORDER — SODIUM CHLORIDE (PF) 0.9 % IJ SOLN
INTRAMUSCULAR | Status: AC
  Filled 2019-07-22: qty 50

## 2019-07-22 MED ORDER — INSULIN ASPART 100 UNIT/ML ~~LOC~~ SOLN: 0.0000 [IU] | Freq: Three times a day (TID) | SUBCUTANEOUS | Status: DC

## 2019-07-22 MED ORDER — ATORVASTATIN CALCIUM 10 MG PO TABS
10.0000 mg | ORAL_TABLET | Freq: Every day | ORAL | Status: DC
  Administered 2019-07-23 – 2019-07-25 (×3): 10 mg via ORAL
  Filled 2019-07-22 (×3): qty 1

## 2019-07-22 MED ORDER — ACETAMINOPHEN 325 MG PO TABS
650.0000 mg | ORAL_TABLET | Freq: Four times a day (QID) | ORAL | Status: DC | PRN
  Administered 2019-07-24 – 2019-07-25 (×2): 650 mg via ORAL
  Filled 2019-07-22 (×2): qty 2

## 2019-07-22 MED ORDER — SODIUM CHLORIDE 0.9 % IV SOLN
2.0000 g | INTRAVENOUS | Status: DC
  Administered 2019-07-23 – 2019-07-25 (×3): 2 g via INTRAVENOUS
  Filled 2019-07-22: qty 20
  Filled 2019-07-22 (×2): qty 2

## 2019-07-22 MED ORDER — OMEGA-3-ACID ETHYL ESTERS 1 G PO CAPS
1.0000 g | ORAL_CAPSULE | Freq: Every day | ORAL | Status: DC
  Administered 2019-07-23 – 2019-07-25 (×3): 1 g via ORAL
  Filled 2019-07-22 (×4): qty 1

## 2019-07-22 NOTE — H&P (Signed)
History and Physical    Angela Simmons IWL:[id number] DOB: Dec 17, 1945 DOA: 07/22/2019  PCP: Binnie Rail, MD  Patient coming from: home  I have personally briefly reviewed patient's old medical records in [facility]  Chief Complaint: chest pain  HPI: Angela Simmons is a 74 y.o. female with medical history significant of history of breast cancer with mastectomy in 1995 followed by tamoxifen use, no XRT, collagen vascular disease, diet-controlled diabetes, hypertension, obesity, hyperlipidemia, and GERD who presents with several day history of increasing chest pain.  She went to [facility] 2 days ago.  Where she underwent EKG x2 troponins chest x-ray and was diagnosed with GERD.  She was given Reglan Carafate and Prevacid and sent home.  Since that time the patient continues to complain of pretty significant chest pain that starts mostly in the midline has been a little bit left-sided and radiates to the right as well.  She just feels exceedingly weak.  She has had cough and feels cold denies true fever.  She has had limited appetite and nausea with one bout of emesis.  She denies any significant leg pain but wonders if her knees are swollen. (ED Course: In the ED patient was noted to be quite tachycardic with a heart rate in the 120s to 150s.  She had a EKG that shows sinus tachycardia, left atrial enlargement, and borderline T wave abnormalities in the lateral leads.  Chest x-ray showed a possible right opacity with bilateral pleural effusions.  She had no significant white count and a normal hemoglobin.  Her creatinine had bumped a smidge to 1.17.  Review of Systems: As per HPI otherwise 10 point review of systems negative.   Past Medical History:  Diagnosis Date  . Breast cancer ([place]) 1990s   right breast  . Collagen vascular disease ([place])   . Diabetes mellitus without complication ([place])   . GERD (gastroesophageal reflux disease)   . HTN (hypertension)   . Hypercholesterolemia    . Obesity   . Osteoarthritis     Past Surgical History:  Procedure Laterality Date  . ABDOMINAL HY[place]CTOMY  [place]   [place]  . COLONOSCOPY  May 2003   Dr. Tamala Julian: normal  . COLONOSCOPY  06/22/2011   Procedure: COLONOSCOPY;  Surgeon: Danie Binder, MD;  Location: [facility];  Service: Endoscopy;  Laterality: N/A;  10:30  . MASTECTOMY  1995   right      reports that she has never smoked. She has never used smokeless tobacco. She reports that she does not drink alcohol and does not use drugs.  Allergies  Allergen Reactions  . Levofloxacin Hives and Itching  . Nitrofurantoin Hives and Itching  . Penicillins Hives and Itching  . Sulfonamide Derivatives Itching  . Pantoprazole     Bad dreams    Family History  Problem Relation Age of Onset  . Diabetes Mother        diabetic coma  . Heart failure Mother   . Diabetes Father   . Emphysema Father   . Diabetes Sister   . Hypercholesterolemia Sister   . Hypercholesterolemia Brother   . Diabetes Brother   . Hypertension Brother   . Hypercholesterolemia Brother   . Diabetes Sister   . Hypertension Sister   . Hypercholesterolemia Sister   . Hypercholesterolemia Sister   . Hypercholesterolemia Sister   . Diabetes Brother   . Hypertension Brother   . Colon cancer Neg Hx  Prior to Admission medications   Medication Sig Start Date End Date Taking? Authorizing Provider  amLODipine (NORVASC) 5 MG tablet TAKE 1 TABLET BY MOUTH EVERY DAY 07/13/19   Binnie Rail, MD  aspirin (ADULT ASPIRIN EC LOW STRENGTH) 81 MG EC tablet Take 81 mg by mouth daily.     [provider]  atorvastatin (LIPITOR) 10 MG tablet TAKE 1 TABLET BY MOUTH DAILY 11/07/18   Binnie Rail, MD  benazepril (LOTENSIN) 40 MG tablet TAKE 1 TABLET BY MOUTH EVERY DAY 03/16/19   Burns, Claudina Lick, MD  Blood Glucose Monitoring Suppl (ONETOUCH VERIO) w/Device KIT USE TO TEST BLOOD SUGAR ONCE DAILY AS DIRECTED 04/05/17   Burns, Claudina Lick, MD  calcium-vitamin  D (OSCAL WITH D) 500-200 MG-UNIT tablet Take 1 tablet by mouth daily with breakfast.    [provider]  diclofenac sodium (VOLTAREN) 1 % GEL Apply 3 g to 3 large joints up to 3 times daily. 07/20/17   Ofilia Neas, PA-C  fluticasone [facility]) 50 MCG/ACT nasal spray USE 2 SPRAYS IN [facility] NOSTRIL EVERY DAY 06/12/14   Fayrene Helper, MD  folic acid (FOLVITE) 737 MCG tablet Take 400 mcg by mouth daily.    [provider]  glucose blood test strip USE TO TEST BLOOD SUGAR ONCE DAILY. E11.40 07/06/18   Binnie Rail, MD  hydroxychloroquine (PLAQUENIL) 200 MG tablet TAKE 1 TABLET BY MOUTH  DAILY Monday through Friday 07/06/19   Bo Merino, MD  loratadine (CLARITIN) 10 MG tablet TAKE 1 TABLET BY MOUTH EVERY DAY Patient taking differently: TAKE 1 TABLET BY MOUTH EVERY DAY PRN 03/25/15   Fayrene Helper, MD  Multiple Vitamin (MULTIVITAMIN) tablet Take 1 tablet by mouth daily.    [provider]  Omega-3 Fatty Acids (FISH OIL) 1000 MG CAPS Take 1 capsule by mouth daily.     [provider]  omeprazole (PRILOSEC) 40 MG capsule TAKE ONE CAPSULE BY MOUTH DAILY 07/03/19   Binnie Rail, MD  [facility] DELICA LANCETS 10G MISC USE TO TEST ONCE DAILY 03/03/18   Binnie Rail, MD  [facility] VERIO test strip USE TO TEST BLOOD SUGAR ONCE DAILY 07/06/18   Binnie Rail, MD  triamcinolone ointment (KENALOG) 0.5 % Apply 1 application topically 2 (two) times daily. Do not use for more than 14 days in a row 04/23/17   Burns, Claudina Lick, MD  UNABLE TO FIND Over the counter Healthy Feet & Nerves    [provider]    Physical Exam: Vitals:   07/22/19 1033 07/22/19 1212 07/22/19 1540 07/22/19 1600  BP: 118/72 131/89 (!) 138/97 (!) 161/88  Pulse: (!) 112 (!) 111 (!) 115 (!) 109  Resp: (!) 22 17 (!) 30 (!) 25  Temp: 98.1 F (36.7 C)  98.3 F (36.8 C)   TempSrc: Oral  Oral   SpO2: 98% 96% 98% 100%  Weight: 83.9 kg 80.2 kg    Height: 5' 9"  (1.753 m)        Constitutional: NAD, calm, comfortable Vitals:   07/22/19 1033 07/22/19 1212 07/22/19 1540 07/22/19 1600  BP: 118/72 131/89 (!) 138/97 (!) 161/88  Pulse: (!) 112 (!) 111 (!) 115 (!) 109  Resp: (!) 22 17 (!) 30 (!) 25  Temp: 98.1 F (36.7 C)  98.3 F (36.8 C)   TempSrc: Oral  Oral   SpO2: 98% 96% 98% 100%  Weight: 83.9 kg 80.2 kg    Height: 5' 9"  (1.753 m)  Eyes: PERRL, lids and conjunctivae normal ENMT: Mucous membranes are moist. Posterior pharynx clear of any exudate or lesions.Normal dentition.  Neck: normal, supple, no masses, no thyromegaly Respiratory: clear to auscultation bilaterally, no wheezing, no crackles. Normal respiratory effort. No accessory muscle use.  Cardiovascular: Regular rate and rhythm, no murmurs / rubs / gallops. No extremity edema. 2+ pedal pulses. No carotid bruits.  Abdomen: no tenderness, no masses palpated. No hepatosplenomegaly. Bowel sounds positive.  Musculoskeletal: no clubbing / cyanosis. No joint deformity upper and lower extremities. Good ROM, no contractures. Normal muscle tone.  Skin: no rashes, lesions, ulcers. No induration Neurologic: CN 2-12 grossly intact. Sensation intact, DTR normal. Strength 5/5 in all 4.  Psychiatric: Normal judgment and insight. Alert and oriented x 3. Normal mood.   Labs on Admission: I have personally reviewed following labs and imaging studies  CBC: Recent Labs  Lab 07/22/19 1152  WBC 10.0  HGB 12.8  HCT 40.1  MCV 93.5  PLT 737   Basic Metabolic Panel: Recent Labs  Lab 07/22/19 1152  NA 136  K 4.4  CL 99  CO2 25  GLUCOSE 127*  BUN 15  CREATININE 1.17*  CALCIUM 8.9   GFR: Estimated Creatinine Clearance: 48.5 mL/min (A) (by C-G formula based on SCr of 1.17 mg/dL (H)). Liver Function Tests: No results for input(s): AST, ALT, ALKPHOS, BILITOT, PROT, ALBUMIN in the last 168 hours. No results for input(s): LIPASE, AMYLASE in the last 168 hours. No results for input(s): AMMONIA in the last  168 hours. Coagulation Profile: No results for input(s): INR, PROTIME in the last 168 hours. Cardiac Enzymes: No results for input(s): CKTOTAL, CKMB, CKMBINDEX, TROPONINI in the last 168 hours. BNP (last 3 results) No results for input(s): PROBNP in the last 8760 hours. HbA1C: No results for input(s): HGBA1C in the last 72 hours. CBG: Recent Labs  Lab 07/22/19 1031  GLUCAP 120*   Lipid Profile: No results for input(s): CHOL, HDL, LDLCALC, TRIG, CHOLHDL, LDLDIRECT in the last 72 hours. Thyroid Function Tests: No results for input(s): TSH, T4TOTAL, FREET4, T3FREE, THYROIDAB in the last 72 hours. Anemia Panel: No results for input(s): VITAMINB12, FOLATE, FERRITIN, TIBC, IRON, RETICCTPCT in the last 72 hours. Urine analysis:    Component Value Date/Time   COLORURINE YELLOW 07/22/2019 1530   APPEARANCEUR CLEAR 07/22/2019 1530   LABSPEC 1.010 07/22/2019 1530   PHURINE 6.0 07/22/2019 1530   GLUCOSEU NEGATIVE 07/22/2019 Ava 06/21/2019 0924   HGBUR NEGATIVE 07/22/2019 1530   HGBUR negative 06/07/2009 0906   BILIRUBINUR NEGATIVE 07/22/2019 1530   BILIRUBINUR neg 04/07/2016 [place]y 07/22/2019 1530   PROTEINUR NEGATIVE 07/22/2019 1530   UROBILINOGEN 0.2 06/21/2019 0924   NITRITE NEGATIVE 07/22/2019 1530   LEUKOCYTESUR LARGE (A) 07/22/2019 1530    Radiological Exams on Admission: DG Chest 2 View  Result Date: 07/22/2019 CLINICAL DATA:  Central chest pain for 4 days EXAM: CHEST - 2 VIEW COMPARISON:  July 20, 2019 FINDINGS: The mediastinal contour and cardiac silhouette are normal. Mild patchy opacity of right lung base is identified. Minimal bilateral posterior pleural effusions are noted. There is no pulmonary edema. The bony structures are stable. IMPRESSION: Mild patchy opacity of right lung base, early pneumonia is not excluded. Minimal bilateral posterior pleural effusions. Electronically Signed   By: Abelardo Diesel M.D.   On: 07/22/2019 10:55     EKG: Independently reviewed.  Sinus tachycardia, left atrial enlargement, questionable T wave abnormality in the lateral leads  Assessment/Plan Active Problems:   Essential hypertension, benign   GERD   History of cancer of right breast   Allergic rhinitis   Chest pain   Hyperlipidemia   PMR (polymyalgia rheumatica) (HCC)   Community acquired pneumonia  Chest pain This is really her primary complaint.  No evidence of coronary artery disease with normal troponins in the ED and no ST-T wave changes on the EKG. we will rule out PE with a D-dimer.  Tachycardia Marked, may be related to pneumonia however will need to rule out PE  Pneumonia We will treat with Rocephin plus azithromycin IV hydration  Diet-controlled diabetes Continue diet  Hypertension Continue amlodipine, Lotensin, aspirin  GERD On PPI at home, allergic to Protonix, will substitute Carafate plus Pepcid  History of breast cancer Stable  Allergic rhinitis Continue Flonase and Claritin  Polymyalgia rheumatica Continue Plaquenil  Hyperlipidemia Continue Lipitor  DVT prophylaxis: Lovenox SQ Code Status: Full code has living will she is unsure exactly what it says though Family Communication: Patient and daughter at bedside Disposition Plan: Home, patient is quite active, lives with her husband,  has a son and sisters very close by Consults called: None Admission status: Inpatient   Donnamae Jude MD [facility]  If 7PM-7AM, please contact night-coverage 07/22/2019, 5:32 PM

## 2019-07-22 NOTE — ED Provider Notes (Addendum)
[facility] DEPT Provider Note   CSN: [id number] Arrival date & time: 07/22/19  1026     History Chief Complaint  Patient presents with   Chest Pain    Angela Simmons is a 74 y.o. female with past medical history significant for breast cancer s/p right mastectomy in 1995, collagen vascular disease, diabetes, GERD, hypertension hyperlipidemia, obesity, osteoarthritis.  Patient takes daily aspirin, not other anticoagulation.  HPI Patient presents to the emergency department today with chief complaint of progressively worsening central chest pain x4 days.  She describes the pain as a soreness.  She states the pain is constant and does not radiate.  Pain is worse when she tries to lay flat.  She rates her chest pain 8/10 in severity.  She is also endorsing dyspnea she states that is worse with exertion as well as a nonproductive cough, chills, nausea with 1 episode of nonbloody nonbilious emesis and decreased appetite, fatigue. She tried taking heartburn medication without any symptom relief.  When asked if she had any urinary frequency or dysuria patient thinks she might have urinary frequency however is unsure.  She denies any fever, sore throat, hemoptysis, headache, neck pain abdominal pain, diarrhea, blood in stool, lower extremity edema.  Patient did receive her Covid vaccinations.    Past Medical History:  Diagnosis Date   Breast cancer ([place]) 1990s   right breast   Collagen vascular disease ([place])    Diabetes mellitus without complication ([facility])    GERD (gastroesophageal reflux disease)    HTN (hypertension)    Hypercholesterolemia    Obesity    Osteoarthritis     Patient Active Problem List   Diagnosis Date Noted   Community acquired pneumonia 07/22/2019   Burning sensation 06/21/2019   DOE (dyspnea on exertion) 04/03/2019   Ankle swelling, left 04/03/2019   Hyperpigmented skin lesion 04/03/2019   Poor balance 12/06/2018    Headache 12/06/2018   Vertigo 06/07/2018   Urinary frequency 06/07/2018   Lower abdominal pain 06/07/2018   Herpes zoster without complication 53/97/6734   Tingling in extremities 11/26/2017   Ischial bursitis of left side 06/02/2017   Sicca syndrome ([place]) 06/23/2016   Bilateral leg edema 04/07/2016   Burning sensation of skin 04/07/2016   PMR (polymyalgia rheumatica) ([place]) 03/27/2016   Osteopenia 03/04/2016   Autoimmune disease ([place]) 03/18/2015   Inflammatory arthritis 03/18/2015   Joint pain 11/22/2014   Overweight (BMI 25.0-29.9) 08/18/2014   Low back pain with radiation 08/18/2014   Hyperlipidemia 06/28/2014   Chest pain 04/11/2014   Tendonitis of shoulder 08/29/2013   S/P right mastectomy 11/14/2012   Type 2 diabetes, controlled, with neuropathy ([place]) 10/05/2012   Allergic rhinitis 01/26/2011   GERD 11/10/2009   INTERSTITIAL CYSTITIS 10/31/2008   BENIGN NEOPLASM OF VULVA 02/01/2008   Essential hypertension, benign 02/08/2007   History of cancer of right breast 02/08/2007    Past Surgical History:  Procedure Laterality Date   ABDOMINAL HY[place]CTOMY  [place]   COLONOSCOPY  May 2003   Dr. Tamala Julian: normal   COLONOSCOPY  06/22/2011   Procedure: COLONOSCOPY;  Surgeon: Danie Binder, MD;  Location: [facility];  Service: Endoscopy;  Laterality: N/A;  10:30   MASTECTOMY  1995   right      OB History   No obstetric history on file.     Family History  Problem Relation Age of Onset   Diabetes Mother        diabetic coma  Heart failure Mother    Diabetes Father    Emphysema Father    Diabetes Sister    Hypercholesterolemia Sister    Hypercholesterolemia Brother    Diabetes Brother    Hypertension Brother    Hypercholesterolemia Brother    Diabetes Sister    Hypertension Sister    Hypercholesterolemia Sister    Hypercholesterolemia Sister    Hypercholesterolemia Sister    Diabetes Brother     Hypertension Brother    Colon cancer Neg Hx     Social History   Tobacco Use   Smoking status: Never Smoker   Smokeless tobacco: Never Used  Scientific laboratory technician Use: Never used  Substance Use Topics   Alcohol use: No   Drug use: Never    Home Medications Prior to Admission medications   Medication Sig Start Date End Date Taking? Authorizing Provider  amLODipine (NORVASC) 5 MG tablet TAKE 1 TABLET BY MOUTH EVERY DAY Patient taking differently: Take 5 mg by mouth daily.  07/13/19  Yes Burns, Claudina Lick, MD  aspirin (ADULT ASPIRIN EC LOW STRENGTH) 81 MG EC tablet Take 81 mg by mouth daily.    Yes [provider]  atorvastatin (LIPITOR) 10 MG tablet TAKE 1 TABLET BY MOUTH DAILY Patient taking differently: Take 10 mg by mouth daily.  11/07/18  Yes Burns, Claudina Lick, MD  benazepril (LOTENSIN) 40 MG tablet TAKE 1 TABLET BY MOUTH EVERY DAY Patient taking differently: Take 40 mg by mouth daily.  03/16/19  Yes Burns, Claudina Lick, MD  Blood Glucose Monitoring Suppl (ONETOUCH VERIO) w/Device KIT USE TO TEST BLOOD SUGAR ONCE DAILY AS DIRECTED Patient taking differently: 1 each by Other route daily.  04/05/17  Yes Burns, Claudina Lick, MD  calcium-vitamin D (OSCAL WITH D) 500-200 MG-UNIT tablet Take 1 tablet by mouth daily with breakfast.   Yes [provider]  diclofenac sodium (VOLTAREN) 1 % GEL Apply 3 g to 3 large joints up to 3 times daily. 07/20/17  Yes Ofilia Neas, PA-C  famotidine (PEPCID) 20 MG tablet Take 20 mg by mouth 2 (two) times daily. 07/21/19  Yes [provider]  fluticasone (FLONASE) 50 MCG/ACT nasal spray USE 2 SPRAYS IN EACH NOSTRIL EVERY DAY Patient taking differently: Place 2 sprays into both nostrils as needed for allergies.  06/12/14  Yes Fayrene Helper, MD  folic acid (FOLVITE) 818 MCG tablet Take 400 mcg by mouth daily.   Yes [provider]  glucose blood test strip USE TO TEST BLOOD SUGAR ONCE DAILY. E11.40 07/06/18  Yes Burns, Claudina Lick, MD    hydroxychloroquine (PLAQUENIL) 200 MG tablet TAKE 1 TABLET BY MOUTH  DAILY Monday through Friday Patient taking differently: Take 200 mg by mouth See admin instructions. Take daily Monday through Friday 07/06/19  Yes Deveshwar, Abel Presto, MD  loratadine (CLARITIN) 10 MG tablet TAKE 1 TABLET BY MOUTH EVERY DAY Patient taking differently: Take 10 mg by mouth as needed for allergies.  03/25/15  Yes Fayrene Helper, MD  metoCLOPramide (REGLAN) 10 MG tablet Take 10 mg by mouth See admin instructions. TAKE 1 TABLET BY MOUTH AFTER MEALS AND AT 9:00pm AND 3:00am FOR SEVEN DAYS. 07/21/19  Yes [provider]  Multiple Vitamin (MULTIVITAMIN) tablet Take 1 tablet by mouth daily.   Yes [provider]  Omega-3 Fatty Acids (FISH OIL) 1000 MG CAPS Take 1 capsule by mouth daily.    Yes [provider]  omeprazole (PRILOSEC) 40 MG capsule TAKE ONE CAPSULE  BY MOUTH DAILY Patient taking differently: Take 40 mg by mouth daily.  07/03/19  Yes Burns, Claudina Lick, MD  [place] LANCETS 83T MISC USE TO TEST ONCE DAILY Patient taking differently: 1 each by Other route daily.  03/03/18  Yes Burns, Claudina Lick, MD  ONETOUCH VERIO test strip USE TO TEST BLOOD SUGAR ONCE DAILY Patient taking differently: 1 each by Other route daily.  07/06/18  Yes Burns, Claudina Lick, MD  triamcinolone ointment (KENALOG) 0.5 % Apply 1 application topically 2 (two) times daily. Do not use for more than 14 days in a row 04/23/17  Yes Burns, Claudina Lick, MD  sucralfate (CARAFATE) 1 GM/10ML suspension Take 1 g by mouth 4 (four) times daily. Patient not taking: Reported on 07/22/2019 07/21/19   [provider]    Allergies    Levofloxacin, Nitrofurantoin, Penicillins, Sulfonamide derivatives, and Pantoprazole  Review of Systems   Review of Systems  All other systems are reviewed and are negative for acute change except as noted in the HPI.   Physical Exam Updated Vital Signs BP 131/89    Pulse (!) 111    Temp 98.1 F (36.7  C) (Oral)    Resp 17    Ht '5\' 9"'$  (1.753 m)    Wt 80.2 kg    SpO2 96%    BMI 26.11 kg/m   Physical Exam Vitals and nursing note reviewed.  Constitutional:      General: She is not in acute distress.    Appearance: She is ill-appearing.  HENT:     Head: Normocephalic and atraumatic.     Right Ear: Tympanic membrane and external ear normal.     Left Ear: Tympanic membrane and external ear normal.     Nose: Nose normal.     Mouth/Throat:     Mouth: Mucous membranes are moist.     Pharynx: Oropharynx is clear.  Eyes:     General: No scleral icterus.       Right eye: No discharge.        Left eye: No discharge.     Extraocular Movements: Extraocular movements intact.     Conjunctiva/sclera: Conjunctivae normal.     Pupils: Pupils are equal, round, and reactive to light.  Neck:     Vascular: No JVD.  Cardiovascular:     Rate and Rhythm: Normal rate and regular rhythm.     Pulses: Normal pulses.          Radial pulses are 2+ on the right side and 2+ on the left side.     Heart sounds: Normal heart sounds.  Pulmonary:     Comments: Symmetric chest rise, normal work of breathing.  Lung sounds slightly decreased in posterior right lung base. No wheezing, rales, or rhonchi.  Oxygen saturation is 96% on room air.  Patient is speaking in full sentences. Chest:     Chest wall: No tenderness.  Abdominal:     Comments: Abdomen is soft, non-distended, and non-tender in all quadrants. No rigidity, no guarding. No peritoneal signs.  Musculoskeletal:        General: Normal range of motion.     Cervical back: Normal range of motion.     Right lower leg: No edema.     Left lower leg: No edema.  Skin:    General: Skin is warm and dry.     Capillary Refill: Capillary refill takes less than 2 seconds.  Neurological:     Mental Status: She is oriented  to person, place, and time.     GCS: GCS eye subscore is 4. GCS verbal subscore is 5. GCS motor subscore is 6.     Comments: Fluent speech, no  facial droop.  Psychiatric:        Behavior: Behavior normal.     ED Results / Procedures / Treatments   Labs (all labs ordered are listed, but only abnormal results are displayed) Labs Reviewed  BASIC METABOLIC PANEL - Abnormal; Notable for the following components:      Result Value   Glucose, Bld 127 (*)    Creatinine, Ser 1.17 (*)    GFR calc non Af Amer 46 (*)    GFR calc Af Amer 54 (*)    All other components within normal limits  URINALYSIS, ROUTINE W REFLEX MICROSCOPIC - Abnormal; Notable for the following components:   Leukocytes,Ua LARGE (*)    Non Squamous Epithelial 0-5 (*)    All other components within normal limits  D-DIMER, QUANTITATIVE (NOT AT [facility]) - Abnormal; Notable for the following components:   D-Dimer, Quant 2.32 (*)    All other components within normal limits  CBG MONITORING, ED - Abnormal; Notable for the following components:   Glucose-Capillary 120 (*)    All other components within normal limits  SARS CORONAVIRUS 2 BY RT PCR (HOSPITAL ORDER, [facility])  URINE CULTURE  CBC  BRAIN NATRIURETIC PEPTIDE  TROPONIN I (HIGH SENSITIVITY)  TROPONIN I (HIGH SENSITIVITY)    EKG EKG Interpretation  Date/Time:  Saturday July 22 2019 10:34:13 EDT Ventricular Rate:  110 PR Interval:    QRS Duration: 67 QT Interval:  343 QTC Calculation: 464 R Axis:   -4 Text Interpretation: Sinus tachycardia Probable left atrial enlargement Borderline T abnormalities, lateral leads 12 Lead; Mason-Likar No old tracing to compare Confirmed by Malvin Johns [phone]) on 07/22/2019 3:13:16 PM   Radiology DG Chest 2 View  Result Date: 07/22/2019 CLINICAL DATA:  Central chest pain for 4 days EXAM: CHEST - 2 VIEW COMPARISON:  July 20, 2019 FINDINGS: The mediastinal contour and cardiac silhouette are normal. Mild patchy opacity of right lung base is identified. Minimal bilateral posterior pleural effusions are noted. There is no pulmonary edema. The bony  structures are stable. IMPRESSION: Mild patchy opacity of right lung base, early pneumonia is not excluded. Minimal bilateral posterior pleural effusions. Electronically Signed   By: Abelardo Diesel M.D.   On: 07/22/2019 10:55   CT Angio Chest PE W/Cm &/Or Wo Cm  Result Date: 07/22/2019 CLINICAL DATA:  Chest pain for 4 days.  Shortness of breath. EXAM: CT ANGIOGRAPHY CHEST WITH CONTRAST TECHNIQUE: Multidetector CT imaging of the chest was performed using the standard protocol during bolus administration of intravenous contrast. Multiplanar CT image reconstructions and MIPs were obtained to evaluate the vascular anatomy. CONTRAST:  143m OMNIPAQUE IOHEXOL 350 MG/ML SOLN COMPARISON:  Radiograph earlier this day.  Radiograph 07/20/2019 FINDINGS: Cardiovascular: There are no filling defects within the pulmonary arteries to suggest pulmonary embolus. Subsegmental branches are not well assessed due to contrast bolus timing. Dilated main pulmonary artery at 3.8 cm. No aortic aneurysm or evidence of dissection. Mild aortic tortuosity. Common origin of the brachiocephalic and left common carotid artery, variant arch anatomy. Normal heart size without pericardial effusion. Mediastinum/Nodes: Enlarged subcarinal node measures 16 mm short axis. There is an enlarged right hilar node measuring 16 mm short axis. Additional ill-defined soft tissue density in the region of the right hilum. No left  hilar adenopathy. 7 mm left hilar node is not enlarged by size criteria. Tiny hiatal hernia. No esophageal wall thickening. Surgical clips in the right axilla from prior axillary node dissection there are multiple small left axillary nodes largest measures 10 mm short axis with normal fatty hilum. No visualized thyroid nodule. Lungs/Pleura: Trace bilateral pleural effusions. Dependent opacities in both lower lobes, favoring atelectasis. No septal thickening or pulmonary edema. No pulmonary mass. There is a single right paramediastinal  bleb. No pulmonary mass. Trace bilateral basilar pleural calcifications. Upper Abdomen: Simple cyst in the left kidney.  No acute findings. Musculoskeletal: Prior right mastectomy. There are no acute or suspicious osseous abnormalities. Review of the MIP images confirms the above findings. IMPRESSION: 1. No pulmonary embolus. 2. Trace bilateral pleural effusions. Dependent opacities in both lower lobes, favoring atelectasis. 3. Enlarged subcarinal and right hilar lymph nodes. Additional ill-defined soft tissue density in the region of the right hilum. Suspect these are reactive but nonspecific. Recommend clinical consultation, consider follow-up chest CT in 3-6 months. 4. Mildly dilated main pulmonary artery at 3.8 cm can be seen with pulmonary arterial hypertension. 5. Trace bilateral pleural calcifications, can be seen with prior asbestos exposure. Aortic Atherosclerosis (ICD10-I70.0). Electronically Signed   By: Keith Rake M.D.   On: 07/22/2019 20:29    Procedures Procedures (including critical care time)  Medications Ordered in ED Medications  sodium chloride flush (NS) 0.9 % injection 3 mL (0 mLs Intravenous Hold 07/22/19 1515)  sodium chloride (PF) 0.9 % injection (has no administration in time range)  sodium chloride 0.9 % bolus 1,000 mL (0 mLs Intravenous Stopped 07/22/19 1749)  cefTRIAXone (ROCEPHIN) 1 g in sodium chloride 0.9 % 100 mL IVPB (0 g Intravenous Stopped 07/22/19 1818)  azithromycin (ZITHROMAX) 500 mg in sodium chloride 0.9 % 250 mL IVPB (0 mg Intravenous Stopped 07/22/19 1905)  iohexol (OMNIPAQUE) 350 MG/ML injection 100 mL (100 mLs Intravenous Contrast Given 07/22/19 2005)    ED Course  I have reviewed the triage vital signs and the nursing notes.  Pertinent labs & imaging results that were available during my care of the patient were reviewed by me and considered in my medical decision making (see chart for details).    MDM Rules/Calculators/A&P                           History provided by patient with additional history obtained from chart review.    Patient seen and examined. Patient presents awake, alert, hemodynamically stable, afebrile. She is tachycardic in triage to 111, no hypoxia.  Patient is nontoxic however does look unwell.  Heart checked during exam and is 115. Lung exam with decreased sounds in right lung base.  She is able to speak in full sentences however is tachypneic with respiration rate of 30.  Abdomen is nontender, no CVA tenderness. Chest xray viewed by me shows an opacity in right lung base, that could be suggestive of early pneumonia.   CBC shows no leukocytosis, no anemia.  BMP without severe electrolyte derangement, she does have mild bump in creatinine, today it is 1.17 compared to her baseline of 0.8.  Delta troponin flat.. EKG without ischemic changes.  History and physical not consistent with ACS or PE.  Patient given liter of IV fluids. UA shows large leukocytes, 21-50 WBC.  Urine culture sent. Patient ambulated and had desaturation to 91% with tachycardia at 140.  Patient was visibly in distress. Discussed with pharmacist as  patient has penicillin and Levaquin allergy with reaction of hives and itching. Cross reactivity is low 5%,  so could do rocephin and azithromycin patient will be closely monitored in the emergency department.  Rocephin would cover for possible UTI also.  Covid test is negative.  BNP is not elevated.  When reassessing patient she is tachycardic to the 120s while at rest.  D-dimer elevated at 2.32 therefore CTA chest ordered.  CTA is negative for PE.  Radiologist does comment on trace bilateral pleural effusions with dependent masses in both lower lobes possible atelectasis. Findings and plan of care discussed with supervising physician Dr. Tamera Punt who agrees with plan to admit. Spoke with Dr. Kennon Rounds with hospitalist service who agrees to assume care of patient and bring into the hospital for further evaluation and  management. Appreciate admission.   Portions of this note were generated with [employer]. Dictation errors may occur despite best attempts at proofreading.   Final Clinical Impression(s) / ED Diagnoses Final diagnoses:  Community acquired pneumonia of right lower lobe of lung    Rx / DC Orders ED Discharge Orders    None       Cherre Robins, PA-C 07/22/19 1702    Naketa Daddario, Harley Hallmark, PA-C 07/22/19 2036    Malvin Johns, MD 07/23/19 1506

## 2019-07-22 NOTE — ED Notes (Signed)
Ambulated patient in room O2 decreased from 99% to 91%. O2 back to 96-98% after sitting about 1 minute HR increased to 140

## 2019-07-22 NOTE — ED Triage Notes (Signed)
Patient here from home reporting central chest pain that started 4 days ago. Denies n/v, dizziness, sob.

## 2019-07-23 DIAGNOSIS — R072 Precordial pain: Secondary | ICD-10-CM

## 2019-07-23 DIAGNOSIS — J189 Pneumonia, unspecified organism: Principal | ICD-10-CM

## 2019-07-23 LAB — GLUCOSE, CAPILLARY
Glucose-Capillary: 97 mg/dL (ref 70–99)
Glucose-Capillary: 104 mg/dL — ABNORMAL HIGH (ref 70–99)
Glucose-Capillary: 108 mg/dL — ABNORMAL HIGH (ref 70–99)
Glucose-Capillary: 123 mg/dL — ABNORMAL HIGH (ref 70–99)

## 2019-07-23 LAB — HIV ANTIBODY (ROUTINE TESTING W REFLEX): HIV Screen 4th Generation wRfx: NONREACTIVE

## 2019-07-23 LAB — STREP PNEUMONIAE URINARY ANTIGEN: Strep Pneumo Urinary Antigen: NEGATIVE

## 2019-07-23 MED ORDER — METOPROLOL TARTRATE 5 MG/5ML IV SOLN: 5.0000 mg | INTRAVENOUS | Status: DC | PRN

## 2019-07-23 MED ORDER — DILTIAZEM HCL 25 MG/5ML IV SOLN
10.0000 mg | Freq: Once | INTRAVENOUS | Status: AC | PRN
  Administered 2019-07-23: 10 mg via INTRAVENOUS
  Filled 2019-07-23: qty 5

## 2019-07-23 MED ORDER — METOPROLOL TARTRATE 5 MG/5ML IV SOLN
5.0000 mg | Freq: Three times a day (TID) | INTRAVENOUS | Status: DC | PRN
  Administered 2019-07-23: 5 mg via INTRAVENOUS
  Filled 2019-07-23: qty 5

## 2019-07-23 NOTE — Progress Notes (Signed)
PROGRESS NOTE    Angela Simmons  OXB:[id number] DOB: [birth year] DOA: 07/22/2019   PCP: Binnie Rail, MD    Brief Narrative: Angela Simmons is a 74 y.o. female with medical history significant of history of breast cancer with mastectomy in 1995 followed by tamoxifen use, no XRT, collagen vascular disease, diet-controlled diabetes, hypertension, obesity, hyperlipidemia, and GERD who presents with several day history of increasing chest pain.  In the ED patient was noted to be quite tachycardic with a heart rate in the 120s to 150s.  She had a EKG that shows sinus tachycardia, left atrial enlargement, and borderline T wave abnormalities in the lateral leads.  Chest x-ray showed a possible right opacity with bilateral pleural effusions.  She had no significant white count and a normal hemoglobin.  CT chest ruled out PE,   She was admitted for community-acquired pneumonia and getting ceftriaxone and Zithromax.  Assessment & Plan:   Active Problems:   Essential hypertension, benign   GERD   History of cancer of right breast   Allergic rhinitis   Chest pain   Hyperlipidemia   PMR (polymyalgia rheumatica) (HCC)   Community acquired pneumonia  Chest pain >>> Improving This is really her primary complaint.   No evidence of coronary artery disease with normal troponins in the ED . No ST-T wave changes on the EKG.  CT chest ruled out PE This could be due to the community-acquired pneumonia.  Tachycardia ->>> Improved.  Marked, may be related to pneumonia , CT chest ruled out PE.  Pneumonia We will treat with Rocephin plus azithromycin IV hydration  Diet-controlled diabetes Continue diet.  Hypertension Continue amlodipine, Lotensin, aspirin  GERD On PPI at home, allergic to Protonix, will substitute Carafate plus Pepcid  History of breast cancer Stable.  Allergic rhinitis Continue Flonase and Claritin  Polymyalgia rheumatica Continue  Plaquenil  Hyperlipidemia Continue Lipitor  DVT prophylaxis: Lovenox SQ Code Status: Full code  Family Communication: Patient and daughter at bedside. Disposition Plan: Home, patient is quite active, lives with her husband,  has a son and sisters very close by Consults called: None  Disposition Plan:          Admit from : Home         Disposition: Home         Anticipated discharge date: Tomorrow   Consultants:     None  Procedures:  None Antimicrobials:  Anti-infectives (From admission, onward)   Start     Dose/Rate Route Frequency Ordered Stop   07/24/19 1000  hydroxychloroquine (PLAQUENIL) tablet 200 mg     Discontinue    Note to Pharmacy: TAKE 1 TABLET BY MOUTH  DAILY Monday through Friday     200 mg Oral Once per day on Mon Tue Wed Thu Fri 07/22/19 2132     07/23/19 1800  azithromycin (ZITHROMAX) tablet 500 mg     Discontinue     500 mg Oral Daily-1800 07/22/19 2132 07/28/19 1759   07/23/19 0600  cefTRIAXone (ROCEPHIN) 2 g in sodium chloride 0.9 % 100 mL IVPB     Discontinue     2 g 200 mL/hr over 30 Minutes Intravenous Every 24 hours 07/22/19 2132 07/28/19 0559   07/22/19 1645  cefTRIAXone (ROCEPHIN) 1 g in sodium chloride 0.9 % 100 mL IVPB        1 g 200 mL/hr over 30 Minutes Intravenous  Once 07/22/19 1642 07/22/19 1818   07/22/19 1645  azithromycin (ZITHROMAX) 500 mg in  sodium chloride 0.9 % 250 mL IVPB        500 mg 250 mL/hr over 60 Minutes Intravenous  Once 07/22/19 1642 07/22/19 1905     Subjective: Patient was seen and examined at bedside.  No overnight events.  She reports chest pain is improving.  Denies any difficulty breathing.  Objective: Vitals:   07/22/19 2129 07/22/19 2131 07/23/19 0417 07/23/19 0914  BP:  126/83 114/65 120/65  Pulse:  (!) 123 (!) 101 82  Resp:  18 16 18   Temp:  99.6 F (37.6 C) 98.4 F (36.9 C) 98.5 F (36.9 C)  TempSrc:  Oral Oral Oral  SpO2:  94% 95% 98%  Weight: 87.1 kg     Height: 5\' 9"  (1.753 m)        Intake/Output Summary (Last 24 hours) at 07/23/2019 1126 Last data filed at 07/23/2019 0931 Gross per 24 hour  Intake 768.74 ml  Output 150 ml  Net 618.74 ml   Filed Weights   07/22/19 1033 07/22/19 1212 07/22/19 2129  Weight: 83.9 kg 80.2 kg 87.1 kg    Examination:  General exam: Appears calm and comfortable  Respiratory system: Clear to auscultation. Respiratory effort normal. Cardiovascular system: S1 & S2 heard, RRR. No JVD, murmurs, rubs, gallops or clicks. No pedal edema. Gastrointestinal system: Abdomen is nondistended, soft and nontender. No organomegaly or masses felt. Normal bowel sounds heard. Central nervous system: Alert and oriented. No focal neurological deficits. Extremities:  No edema, No cyanosis, No swelling Skin: No rashes, lesions or ulcers Psychiatry: Judgement and insight appear normal. Mood & affect appropriate.    Data Reviewed: I have personally reviewed following labs and imaging studies  CBC: Recent Labs  Lab 07/22/19 1152  WBC 10.0  HGB 12.8  HCT 40.1  MCV 93.5  PLT 419   Basic Metabolic Panel: Recent Labs  Lab 07/22/19 1152  NA 136  K 4.4  CL 99  CO2 25  GLUCOSE 127*  BUN 15  CREATININE 1.17*  CALCIUM 8.9   GFR: Estimated Creatinine Clearance: 50.4 mL/min (A) (by C-G formula based on SCr of 1.17 mg/dL (H)). Liver Function Tests: No results for input(s): AST, ALT, ALKPHOS, BILITOT, PROT, ALBUMIN in the last 168 hours. No results for input(s): LIPASE, AMYLASE in the last 168 hours. No results for input(s): AMMONIA in the last 168 hours. Coagulation Profile: No results for input(s): INR, PROTIME in the last 168 hours. Cardiac Enzymes: No results for input(s): CKTOTAL, CKMB, CKMBINDEX, TROPONINI in the last 168 hours. BNP (last 3 results) No results for input(s): PROBNP in the last 8760 hours. HbA1C: No results for input(s): HGBA1C in the last 72 hours. CBG: Recent Labs  Lab 07/22/19 1031 07/22/19 2305 07/23/19 0820   GLUCAP 120* 99 97   Lipid Profile: No results for input(s): CHOL, HDL, LDLCALC, TRIG, CHOLHDL, LDLDIRECT in the last 72 hours. Thyroid Function Tests: No results for input(s): TSH, T4TOTAL, FREET4, T3FREE, THYROIDAB in the last 72 hours. Anemia Panel: No results for input(s): VITAMINB12, FOLATE, FERRITIN, TIBC, IRON, RETICCTPCT in the last 72 hours. Sepsis Labs: No results for input(s): PROCALCITON, LATICACIDVEN in the last 168 hours.  Recent Results (from the past 240 hour(s))  SARS Coronavirus 2 by RT PCR (hospital order, performed in [facility] hospital lab) Nasopharyngeal Nasopharyngeal Swab     Status: None   Collection Time: 07/22/19  5:50 PM   Specimen: Nasopharyngeal Swab  Result Value Ref Range Status   SARS Coronavirus 2 NEGATIVE NEGATIVE Final  Comment: (NOTE) SARS-CoV-2 target nucleic acids are NOT DETECTED.  The SARS-CoV-2 RNA is generally detectable in upper and lower respiratory specimens during the acute phase of infection. The lowest concentration of SARS-CoV-2 viral copies this assay can detect is 250 copies / mL. A negative result does not preclude SARS-CoV-2 infection and should not be used as the sole basis for treatment or other patient management decisions.  A negative result may occur with improper specimen collection / handling, submission of specimen other than nasopharyngeal swab, presence of viral mutation(s) within the areas targeted by this assay, and inadequate number of viral copies (<250 copies / mL). A negative result must be combined with clinical observations, patient history, and epidemiological information.  Fact Sheet for Patients:   StrictlyIdeas.no  Fact Sheet for Healthcare Providers: BankingDealers.co.za  This test is not yet approved or  cleared by the [place] FDA and has been authorized for detection and/or diagnosis of SARS-CoV-2 by FDA under an Emergency Use Authorization  (EUA).  This EUA will remain in effect (meaning this test can be used) for the duration of the COVID-19 declaration under Section 564(b)(1) of the Act, 21 U.S.C. section 360bbb-3(b)(1), unless the authorization is terminated or revoked sooner.  Performed at [facility], [place] [address] [place], [place] [address]     Radiology Studies: DG Chest 2 View  Result Date: 07/22/2019 CLINICAL DATA:  Central chest pain for 4 days EXAM: CHEST - 2 VIEW COMPARISON:  July 20, 2019 FINDINGS: The mediastinal contour and cardiac silhouette are normal. Mild patchy opacity of right lung base is identified. Minimal bilateral posterior pleural effusions are noted. There is no pulmonary edema. The bony structures are stable. IMPRESSION: Mild patchy opacity of right lung base, early pneumonia is not excluded. Minimal bilateral posterior pleural effusions. Electronically Signed   By: Abelardo Diesel M.D.   On: 07/22/2019 10:55   CT Angio Chest PE W/Cm &/Or Wo Cm  Result Date: 07/22/2019 CLINICAL DATA:  Chest pain for 4 days.  Shortness of breath. EXAM: CT ANGIOGRAPHY CHEST WITH CONTRAST TECHNIQUE: Multidetector CT imaging of the chest was performed using the standard protocol during bolus administration of intravenous contrast. Multiplanar CT image reconstructions and MIPs were obtained to evaluate the vascular anatomy. CONTRAST:  127mL OMNIPAQUE IOHEXOL 350 MG/ML SOLN COMPARISON:  Radiograph earlier this day.  Radiograph 07/20/2019 FINDINGS: Cardiovascular: There are no filling defects within the pulmonary arteries to suggest pulmonary embolus. Subsegmental branches are not well assessed due to contrast bolus timing. Dilated main pulmonary artery at 3.8 cm. No aortic aneurysm or evidence of dissection. Mild aortic tortuosity. Common origin of the brachiocephalic and left common carotid artery, variant arch anatomy. Normal heart size without pericardial effusion. Mediastinum/Nodes: Enlarged subcarinal node  measures 16 mm short axis. There is an enlarged right hilar node measuring 16 mm short axis. Additional ill-defined soft tissue density in the region of the right hilum. No left hilar adenopathy. 7 mm left hilar node is not enlarged by size criteria. Tiny hiatal hernia. No esophageal wall thickening. Surgical clips in the right axilla from prior axillary node dissection there are multiple small left axillary nodes largest measures 10 mm short axis with normal fatty hilum. No visualized thyroid nodule. Lungs/Pleura: Trace bilateral pleural effusions. Dependent opacities in both lower lobes, favoring atelectasis. No septal thickening or pulmonary edema. No pulmonary mass. There is a single right paramediastinal bleb. No pulmonary mass. Trace bilateral basilar pleural calcifications. Upper Abdomen: Simple cyst in the left kidney.  No acute findings. Musculoskeletal: Prior right mastectomy. There are no acute or suspicious osseous abnormalities. Review of the MIP images confirms the above findings. IMPRESSION: 1. No pulmonary embolus. 2. Trace bilateral pleural effusions. Dependent opacities in both lower lobes, favoring atelectasis. 3. Enlarged subcarinal and right hilar lymph nodes. Additional ill-defined soft tissue density in the region of the right hilum. Suspect these are reactive but nonspecific. Recommend clinical consultation, consider follow-up chest CT in 3-6 months. 4. Mildly dilated main pulmonary artery at 3.8 cm can be seen with pulmonary arterial hypertension. 5. Trace bilateral pleural calcifications, can be seen with prior asbestos exposure. Aortic Atherosclerosis (ICD10-I70.0). Electronically Signed   By: Keith Rake M.D.   On: 07/22/2019 20:29    Scheduled Meds: . amLODipine  5 mg Oral Daily  . aspirin EC  81 mg Oral Daily  . atorvastatin  10 mg Oral Daily  . azithromycin  500 mg Oral q1800  . benazepril  40 mg Oral Daily  . calcium-vitamin D  1 tablet Oral Q breakfast  . diclofenac  Sodium  2 g Topical QID  . enoxaparin (LOVENOX) injection  40 mg Subcutaneous QHS  . fluticasone  2 spray Each Nare Daily  . folic acid  2,353 mcg Oral Daily  . [START ON 07/24/2019] hydroxychloroquine  200 mg Oral Once per day on Mon Tue Wed Thu Fri  . insulin aspart  0-15 Units Subcutaneous TID WC  . loratadine  10 mg Oral Daily  . omega-3 acid ethyl esters  1 g Oral Daily   Continuous Infusions: . sodium chloride 50 mL/hr at 07/23/19 0800  . cefTRIAXone (ROCEPHIN)  IV Stopped (07/23/19 0533)     LOS: 1 day    Time spent: 25 mins.  Shawna Clamp, MD [facility]   If 7PM-7AM, please contact night-coverage

## 2019-07-23 NOTE — Plan of Care (Signed)

## 2019-07-24 DIAGNOSIS — R079 Chest pain, unspecified: Secondary | ICD-10-CM

## 2019-07-24 DIAGNOSIS — R091 Pleurisy: Secondary | ICD-10-CM

## 2019-07-24 LAB — GLUCOSE, CAPILLARY
Glucose-Capillary: 79 mg/dL (ref 70–99)
Glucose-Capillary: 140 mg/dL — ABNORMAL HIGH (ref 70–99)
Glucose-Capillary: 115 mg/dL — ABNORMAL HIGH (ref 70–99)
Glucose-Capillary: 119 mg/dL — ABNORMAL HIGH (ref 70–99)

## 2019-07-24 LAB — URINE CULTURE

## 2019-07-24 LAB — BASIC METABOLIC PANEL
Anion gap: 9 (ref 5–15)
BUN: 17 mg/dL (ref 8–23)
CO2: 19 mmol/L — ABNORMAL LOW (ref 22–32)
Calcium: 8.5 mg/dL — ABNORMAL LOW (ref 8.9–10.3)
Chloride: 111 mmol/L (ref 98–111)
Creatinine, Ser: 0.84 mg/dL (ref 0.44–1.00)
GFR calc Af Amer: >60 mL/min (ref >60)
GFR calc non Af Amer: >60 mL/min (ref >60)
Glucose, Bld: 88 mg/dL (ref 70–99)
Potassium: 4.3 mmol/L (ref 3.5–5.1)
Sodium: 139 mmol/L (ref 135–145)

## 2019-07-24 MED ORDER — METOPROLOL TARTRATE 25 MG PO TABS
25.0000 mg | ORAL_TABLET | Freq: Two times a day (BID) | ORAL | Status: DC
  Administered 2019-07-24 – 2019-07-25 (×2): 25 mg via ORAL
  Filled 2019-07-24 (×2): qty 1

## 2019-07-24 MED ORDER — APIXABAN 5 MG PO TABS
5.0000 mg | ORAL_TABLET | Freq: Two times a day (BID) | ORAL | Status: DC
  Administered 2019-07-24 – 2019-07-25 (×2): 5 mg via ORAL
  Filled 2019-07-24 (×2): qty 1

## 2019-07-24 NOTE — Progress Notes (Addendum)
Patient discussed with primary team, known to me from cardiology clinic. Asked specifically about her issues with arrhythmia this admission. Telemetry reviewed, episodes of clear atach but in depth tele review also shows episodes of aflutter. New diagnosis for patient. Would start lopressor 25mg  bid. Recent echo 04/2019, not need to repeat. CHADS2Vasc score is (HTN, Age x 1, DM, gender) is 4. Would d/c her home aspirin and start her on eliquis 5mg  bid. We will arrange outpatient follow up. She is in agreement to start these medications.    Carlyle Dolly MD

## 2019-07-24 NOTE — Consult Note (Signed)
NAME:  Angela Simmons, MRN:  [id number], DOB:  Jul 06, 1945, LOS: 2 ADMISSION DATE:  07/22/2019, CONSULTATION DATE:  7/5 REFERRING MD:  Dwyane Dee, CHIEF COMPLAINT:  Abnormal CT chest    Brief History   74 year old female w/ prior breast cancer, also on Plaquenil for Collagen vascular disease. Presents 7/3 w/ possible CAP, w/ chest discomfort and cough. Had abnormal CT scan showing LAN and small pleural plaques. Because of this PCCM asked to evaluate   History of present illness   74 year old female with history as mentioned below presented to the emergency room on 7/3 with chief complaint of chest discomfort.  This was initially evaluated on 7/3 hospital, at that time she underwent EKG and troponin trending these were nondiagnostic and negative for cardiac etiology, she was diagnosed with gastroesophageal reflux disease and sent home with Reglan and Carafate.  She presented once again with ongoing chest discomfort mostly left-sided, had nonproductive cough, chills but no fever, and poor appetite with nausea and 1 episode of vomiting.  Had no known leg swelling or pain.  In the emergency room she was tachycardic with heart rate in the 120s to 150s, chest x-ray raising concern for bibasilar opacities and she underwent further evaluation which included CT imaging to rule out pulmonary emboli.  Review of CT imaging showed bibasilar opacities reflecting either atelectasis or pneumonia, trace pleural effusions, enlarged subcarinal and right hilar lymph nodes and mild pulmonary artery dilation suggesting possible pulmonary hypertension.  Also pleural calcifications raising concern for possible prior asbestos exposure. She was started on antibiotics with a working diagnosis of community-acquired pneumonia, and pulmonary has been asked to comment on her abdominal CT chest  Past Medical History   History of breast cancer status postmastectomy 1995 and followed by tamoxifen did not receive radiation therapy Collagen  vascular disease followed by rheumatology carries the diagnosis of polymyalgia rheumatica and Sjogrens syndrome:PMR currently in remission, she is on Plaquenil twice daily for her autoimmune disease Type 2 diabetes Obesity Hyperlipidemia GERD  Significant Hospital Events   7/3 admitted  Consults:  Pulmonary consulted 7/5 for abnormal CT of chest  Procedures:    Significant Diagnostic Tests:  CT chest 7/3:1. No pulmonary embolus.2. Trace bilateral pleural effusions. Dependent opacities in both lower lobes, favoring atelectasis.3. Enlarged subcarinal and right hilar lymph nodes. Additional ill-defined soft tissue density in the region of the right hilum.Suspect these are reactive but nonspecific. Recommend clinical consultation, consider follow-up chest CT in 3-6 months.4. Mildly dilated main pulmonary artery at 3.8 cm can be seen with pulmonary arterial hypertension.5. Trace bilateral pleural calcifications, can be seen with prior asbestos exposure.   Micro Data:   UC mult orgs  Antimicrobials:  Ctx 7/3>> azith 7/3>>> Interim history/subjective:  Feels much better   Objective   Blood pressure 139/82, pulse 82, temperature 97.8 F (36.6 C), temperature source Oral, resp. rate 18, height _0  (1.753 m), weight 87.1 kg, SpO2 96 %.        Intake/Output Summary (Last 24 hours) at 07/24/2019 0913 Last data filed at 07/24/2019 0600 Gross per 24 hour  Intake 1305.53 ml  Output 1300 ml  Net 5.53 ml   Filed Weights   07/22/19 1033 07/22/19 1212 07/22/19 2129  Weight: 83.9 kg 80.2 kg 87.1 kg    Examination: General: 74 year old aff resting in bed no acute distress HENT: NCAT no JVD, MMM skin shiny and tight  Lungs: clear no accessory use Room air Cardiovascular: RRR  Abdomen: soft not  tender  Extremities: warm and dry  Neuro: awake and oriented  GU: voids   Resolved Hospital Problem list     Assessment & Plan:  CAP viral vs bacterial Pleuritic chest pain (resolved)  this would support viral process Small bilateral pleural effusions w/ possible calcification Mediastinal LAN  H/o breast cancer H/o Sjogren's on Plaquenil  H/o PMR   Discussion Seems as though there are two issues. Acutely CAP; suspect viral but has improved on abx so would treat as such. Secondly the small pleural fluid collections w/ possible calcification (not easy to see on our eval); as well as LAN which may simply be reactive  Plan/rec Cont current abx Will send quanteferon gold test   Will have our office set her up for repeat CT scan in 3 months and follow up   Best practice:  Per primary   Labs   CBC: Recent Labs  Lab 07/22/19 1152  WBC 10.0  HGB 12.8  HCT 40.1  MCV 93.5  PLT 196    Basic Metabolic Panel: Recent Labs  Lab 07/22/19 1152 07/24/19 0526  NA 136 139  K 4.4 4.3  CL 99 111  CO2 25 19*  GLUCOSE 127* 88  BUN 15 17  CREATININE 1.17* 0.84  CALCIUM 8.9 8.5*   GFR: Estimated Creatinine Clearance: 70.2 mL/min (by C-G formula based on SCr of 0.84 mg/dL). Recent Labs  Lab 07/22/19 1152  WBC 10.0    Liver Function Tests: No results for input(s): AST, ALT, ALKPHOS, BILITOT, PROT, ALBUMIN in the last 168 hours. No results for input(s): LIPASE, AMYLASE in the last 168 hours. No results for input(s): AMMONIA in the last 168 hours.  ABG No results found for: PHART, PCO2ART, PO2ART, HCO3, TCO2, ACIDBASEDEF, O2SAT   Coagulation Profile: No results for input(s): INR, PROTIME in the last 168 hours.  Cardiac Enzymes: No results for input(s): CKTOTAL, CKMB, CKMBINDEX, TROPONINI in the last 168 hours.  HbA1C: Hgb A1c MFr Bld  Date/Time Value Ref Range Status  06/21/2019 09:24 AM 6.0 4.6 - 6.5 % Final    Comment:    Glycemic Control Guidelines for People with Diabetes:Non Diabetic:  <6%Goal of Therapy: <7%Additional Action Suggested:  >8%   12/06/2018 12:03 PM 6.1 4.6 - 6.5 % Final    Comment:    Glycemic Control Guidelines for People with  Diabetes:Non Diabetic:  <6%Goal of Therapy: <7%Additional Action Suggested:  >8%     CBG: Recent Labs  Lab 07/23/19 0820 07/23/19 1240 07/23/19 1632 07/23/19 2037 07/24/19 0713  GLUCAP 97 104* 108* 123* 79    Review of Systems:   Review of Systems  Constitutional: Positive for chills and diaphoresis. Negative for fever, malaise/fatigue and weight loss.  HENT: Negative.   Eyes: Negative.   Respiratory: Positive for cough and shortness of breath.   Cardiovascular: Positive for chest pain. Negative for leg swelling.  Gastrointestinal: Positive for nausea.  Genitourinary: Negative.   Musculoskeletal: Negative.   Skin: Negative.   Neurological: Negative.   Endo/Heme/Allergies: Negative.   Psychiatric/Behavioral: Negative.     Past Medical History  She,  has a past medical history of Breast cancer ([place]) (1990s), Collagen vascular disease ([place]), Diabetes mellitus without complication ([place]), GERD (gastroesophageal reflux disease), HTN (hypertension), Hypercholesterolemia, Obesity, and Osteoarthritis.   Surgical History    Past Surgical History:  Procedure Laterality Date  . ABDOMINAL HYSTERECTOMY  1993   [place]  . COLONOSCOPY  May 2003   Dr. Tamala Julian: normal  . COLONOSCOPY  06/22/2011  Procedure: COLONOSCOPY;  Surgeon: Danie Binder, MD;  Location: [facility];  Service: Endoscopy;  Laterality: N/A;  10:30  . MASTECTOMY  1995   right      Social History   reports that she has never smoked. She has never used smokeless tobacco. She reports that she does not drink alcohol and does not use drugs.   Family History   Her family history includes Diabetes in her brother, brother, father, mother, sister, and sister; Emphysema in her father; Heart failure in her mother; Hypercholesterolemia in her brother, brother, sister, sister, sister, and sister; Hypertension in her brother, brother, and sister. There is no history of Colon cancer.   Allergies Allergies  Allergen  Reactions  . Levofloxacin Hives and Itching  . Nitrofurantoin Hives and Itching  . Penicillins Hives and Itching  . Sulfonamide Derivatives Itching  . Pantoprazole Other (See Comments)    Bad dreams     Home Medications  Prior to Admission medications   Medication Sig Start Date End Date Taking? Authorizing Provider  amLODipine (NORVASC) 5 MG tablet TAKE 1 TABLET BY MOUTH EVERY DAY Patient taking differently: Take 5 mg by mouth daily.  07/13/19  Yes Burns, Claudina Lick, MD  aspirin (ADULT ASPIRIN EC LOW STRENGTH) 81 MG EC tablet Take 81 mg by mouth daily.    Yes [provider]  atorvastatin (LIPITOR) 10 MG tablet TAKE 1 TABLET BY MOUTH DAILY Patient taking differently: Take 10 mg by mouth daily.  11/07/18  Yes Burns, Claudina Lick, MD  benazepril (LOTENSIN) 40 MG tablet TAKE 1 TABLET BY MOUTH EVERY DAY Patient taking differently: Take 40 mg by mouth daily.  03/16/19  Yes Burns, Claudina Lick, MD  Blood Glucose Monitoring Suppl (ONETOUCH VERIO) w/Device KIT USE TO TEST BLOOD SUGAR ONCE DAILY AS DIRECTED Patient taking differently: 1 each by Other route daily.  04/05/17  Yes Burns, Claudina Lick, MD  calcium-vitamin D (OSCAL WITH D) 500-200 MG-UNIT tablet Take 1 tablet by mouth daily with breakfast.   Yes [provider]  diclofenac sodium (VOLTAREN) 1 % GEL Apply 3 g to 3 large joints up to 3 times daily. 07/20/17  Yes Ofilia Neas, PA-C  famotidine (PEPCID) 20 MG tablet Take 20 mg by mouth 2 (two) times daily. 07/21/19  Yes [provider]  fluticasone (FLONASE) 50 MCG/ACT nasal spray USE 2 SPRAYS IN EACH NOSTRIL EVERY DAY Patient taking differently: Place 2 sprays into both nostrils as needed for allergies.  06/12/14  Yes Fayrene Helper, MD  folic acid (FOLVITE) 295 MCG tablet Take 400 mcg by mouth daily.   Yes [provider]  glucose blood test strip USE TO TEST BLOOD SUGAR ONCE DAILY. E11.40 07/06/18  Yes Burns, Claudina Lick, MD  hydroxychloroquine (PLAQUENIL) 200 MG tablet  TAKE 1 TABLET BY MOUTH  DAILY Monday through Friday Patient taking differently: Take 200 mg by mouth See admin instructions. Take daily Monday through Friday 07/06/19  Yes Deveshwar, Abel Presto, MD  loratadine (CLARITIN) 10 MG tablet TAKE 1 TABLET BY MOUTH EVERY DAY Patient taking differently: Take 10 mg by mouth as needed for allergies.  03/25/15  Yes Fayrene Helper, MD  metoCLOPramide (REGLAN) 10 MG tablet Take 10 mg by mouth See admin instructions. TAKE 1 TABLET BY MOUTH AFTER MEALS AND AT 9:00pm AND 3:00am FOR SEVEN DAYS. 07/21/19  Yes [provider]  Multiple Vitamin (MULTIVITAMIN) tablet Take 1 tablet by mouth daily.   Yes [provider]  Omega-3 Fatty  Acids (FISH OIL) 1000 MG CAPS Take 1 capsule by mouth daily.    Yes [provider]  omeprazole (PRILOSEC) 40 MG capsule TAKE ONE CAPSULE BY MOUTH DAILY Patient taking differently: Take 40 mg by mouth daily.  07/03/19  Yes Burns, Claudina Lick, MD  [place] LANCETS 40G MISC USE TO TEST ONCE DAILY Patient taking differently: 1 each by Other route daily.  03/03/18  Yes Burns, Claudina Lick, MD  ONETOUCH VERIO test strip USE TO TEST BLOOD SUGAR ONCE DAILY Patient taking differently: 1 each by Other route daily.  07/06/18  Yes Burns, Claudina Lick, MD  triamcinolone ointment (KENALOG) 0.5 % Apply 1 application topically 2 (two) times daily. Do not use for more than 14 days in a row 04/23/17  Yes Burns, Claudina Lick, MD  sucralfate (CARAFATE) 1 GM/10ML suspension Take 1 g by mouth 4 (four) times daily. Patient not taking: Reported on 07/22/2019 07/21/19   [provider]     Critical care time: NA    Erick Colace ACNP-BC [place] Pager # [phone] OR # [phone] if no answer

## 2019-07-24 NOTE — Progress Notes (Signed)
PROGRESS NOTE    Angela Simmons  VEL:[id number] DOB: Sep 06, 1945 DOA: 07/22/2019   PCP: Binnie Rail, MD    Brief Narrative: Angela Simmons is a 74 y.o. female with medical history significant of history of breast cancer with mastectomy in 1995 followed by tamoxifen use, no XRT, collagen vascular disease, diet-controlled diabetes, hypertension, obesity, hyperlipidemia, and GERD who presents with several day history of increasing chest pain.  In the ED patient was noted to be quite tachycardic with a heart rate in the 120s to 150s.  She had a EKG that shows sinus tachycardia, left atrial enlargement, and borderline T wave abnormalities in the lateral leads.  Chest x-ray showed a possible right opacity with bilateral pleural effusions.  She had no significant white count and a normal hemoglobin.  CT chest ruled out PE,   She was admitted for community-acquired pneumonia and getting ceftriaxone and Zithromax.  Assessment & Plan:   Active Problems:   Essential hypertension, benign   GERD   History of cancer of right breast   Allergic rhinitis   Chest pain   Hyperlipidemia   PMR (polymyalgia rheumatica) (HCC)   Community acquired pneumonia  Chest pain >>> Improving This is really her primary complaint.   No evidence of coronary artery disease with normal troponins in the ED . No ST-T wave changes on the EKG.  CT chest ruled out PE CT shows findings consistent with asbestos exposure, pulmonology consulted. Think this is secondary to pleurisy, advised ibuprofen as needed for pain.  SVT/ Atrial flutter CT chest ruled out PE. Patient continues to have SVT and A. flutter throughout the night requiring multiple doses of metoprolol and cardizem Heart rate controlled at this point, cardiology consulted,  will follow up recommendation.  Pneumonia We will treat with Rocephin plus azithromycin IV hydration  Diet-controlled diabetes Continue diet.  Hypertension Continue amlodipine,  Lotensin, aspirin  GERD On PPI at home, allergic to Protonix, will substitute Carafate plus Pepcid  History of breast cancer Stable.  Allergic rhinitis Continue Flonase and Claritin  Polymyalgia rheumatica Continue Plaquenil  Hyperlipidemia Continue Lipitor  DVT prophylaxis: Lovenox SQ Code Status: Full code  Family Communication: Patient and daughter at bedside. Disposition Plan: Home, patient is quite active, lives with her husband,  has a son and sisters very close by Consults called: None  Disposition Plan:          Admit from : Home         Disposition: Home         Anticipated discharge date: Tomorrow   Consultants:     None  Procedures:  None Antimicrobials:  Anti-infectives (From admission, onward)   Start     Dose/Rate Route Frequency Ordered Stop   07/24/19 1000  hydroxychloroquine (PLAQUENIL) tablet 200 mg     Discontinue    Note to Pharmacy: TAKE 1 TABLET BY MOUTH  DAILY Monday through Friday     200 mg Oral Once per day on Mon Tue Wed Thu Fri 07/22/19 2132     07/23/19 1800  azithromycin (ZITHROMAX) tablet 500 mg     Discontinue     500 mg Oral Daily-1800 07/22/19 2132 07/28/19 1759   07/23/19 0600  cefTRIAXone (ROCEPHIN) 2 g in sodium chloride 0.9 % 100 mL IVPB     Discontinue     2 g 200 mL/hr over 30 Minutes Intravenous Every 24 hours 07/22/19 2132 07/28/19 0559   07/22/19 1645  cefTRIAXone (ROCEPHIN) 1 g in sodium chloride  0.9 % 100 mL IVPB        1 g 200 mL/hr over 30 Minutes Intravenous  Once 07/22/19 1642 07/22/19 1818   07/22/19 1645  azithromycin (ZITHROMAX) 500 mg in sodium chloride 0.9 % 250 mL IVPB        500 mg 250 mL/hr over 60 Minutes Intravenous  Once 07/22/19 1642 07/22/19 1905     Subjective: Patient was seen and examined at bedside.  Overnight events noted  She reports chest pain is improving.  Denies any difficulty breathing.  Objective: Vitals:   07/23/19 2217 07/24/19 0156 07/24/19 0455 07/24/19 1258  BP: 119/65 125/69  139/82 124/65  Pulse: 86 77 82 77  Resp: 16 18 18 16   Temp: 98 F (36.7 C) 97.9 F (36.6 C) 97.8 F (36.6 C) 97.8 F (36.6 C)  TempSrc: Oral Oral Oral Oral  SpO2: 98% 97% 96% 98%  Weight:      Height:        Intake/Output Summary (Last 24 hours) at 07/24/2019 1535 Last data filed at 07/24/2019 1500 Gross per 24 hour  Intake 1995.5 ml  Output 1300 ml  Net 695.5 ml   Filed Weights   07/22/19 1033 07/22/19 1212 07/22/19 2129  Weight: 83.9 kg 80.2 kg 87.1 kg    Examination:  General exam: Appears calm and comfortable  Respiratory system: Clear to auscultation. Respiratory effort normal. Cardiovascular system: S1 & S2 heard, RRR. No JVD, murmurs, rubs, gallops or clicks. No pedal edema. Gastrointestinal system: Abdomen is nondistended, soft and nontender. No organomegaly or masses felt. Normal bowel sounds heard. Central nervous system: Alert and oriented. No focal neurological deficits. Extremities:  No edema, No cyanosis, No swelling Skin: No rashes, lesions or ulcers Psychiatry: Judgement and insight appear normal. Mood & affect appropriate.    Data Reviewed: I have personally reviewed following labs and imaging studies  CBC: Recent Labs  Lab 07/22/19 1152  WBC 10.0  HGB 12.8  HCT 40.1  MCV 93.5  PLT 258   Basic Metabolic Panel: Recent Labs  Lab 07/22/19 1152 07/24/19 0526  NA 136 139  K 4.4 4.3  CL 99 111  CO2 25 19*  GLUCOSE 127* 88  BUN 15 17  CREATININE 1.17* 0.84  CALCIUM 8.9 8.5*   GFR: Estimated Creatinine Clearance: 70.2 mL/min (by C-G formula based on SCr of 0.84 mg/dL). Liver Function Tests: No results for input(s): AST, ALT, ALKPHOS, BILITOT, PROT, ALBUMIN in the last 168 hours. No results for input(s): LIPASE, AMYLASE in the last 168 hours. No results for input(s): AMMONIA in the last 168 hours. Coagulation Profile: No results for input(s): INR, PROTIME in the last 168 hours. Cardiac Enzymes: No results for input(s): CKTOTAL, CKMB,  CKMBINDEX, TROPONINI in the last 168 hours. BNP (last 3 results) No results for input(s): PROBNP in the last 8760 hours. HbA1C: No results for input(s): HGBA1C in the last 72 hours. CBG: Recent Labs  Lab 07/23/19 1240 07/23/19 1632 07/23/19 2037 07/24/19 0713 07/24/19 1105  GLUCAP 104* 108* 123* 79 140*   Lipid Profile: No results for input(s): CHOL, HDL, LDLCALC, TRIG, CHOLHDL, LDLDIRECT in the last 72 hours. Thyroid Function Tests: No results for input(s): TSH, T4TOTAL, FREET4, T3FREE, THYROIDAB in the last 72 hours. Anemia Panel: No results for input(s): VITAMINB12, FOLATE, FERRITIN, TIBC, IRON, RETICCTPCT in the last 72 hours. Sepsis Labs: No results for input(s): PROCALCITON, LATICACIDVEN in the last 168 hours.  Recent Results (from the past 240 hour(s))  Urine culture  Status: Abnormal   Collection Time: 07/22/19  5:02 PM   Specimen: Urine, Clean Catch  Result Value Ref Range Status   Specimen Description   Final    URINE, CLEAN CATCH Performed at [facility], [place] [address] [place], [place] [address]    Special Requests   Final    NONE Performed at [facility], [place] [address] [place], [place] [address]    Culture MULTIPLE SPECIES PRESENT, SUGGEST RECOLLECTION (A)  Final   Report Status 07/24/2019 FINAL  Final  SARS Coronavirus 2 by RT PCR (hospital order, performed in [facility] hospital lab) Nasopharyngeal Nasopharyngeal Swab     Status: None   Collection Time: 07/22/19  5:50 PM   Specimen: Nasopharyngeal Swab  Result Value Ref Range Status   SARS Coronavirus 2 NEGATIVE NEGATIVE Final    Comment: (NOTE) SARS-CoV-2 target nucleic acids are NOT DETECTED.  The SARS-CoV-2 RNA is generally detectable in upper and lower respiratory specimens during the acute phase of infection. The lowest concentration of SARS-CoV-2 viral copies this assay can detect is 250 copies / mL. A negative result does not preclude SARS-CoV-2  infection and should not be used as the sole basis for treatment or other patient management decisions.  A negative result may occur with improper specimen collection / handling, submission of specimen other than nasopharyngeal swab, presence of viral mutation(s) within the areas targeted by this assay, and inadequate number of viral copies (<250 copies / mL). A negative result must be combined with clinical observations, patient history, and epidemiological information.  Fact Sheet for Patients:   StrictlyIdeas.no  Fact Sheet for Healthcare Providers: BankingDealers.co.za  This test is not yet approved or  cleared by the [place] FDA and has been authorized for detection and/or diagnosis of SARS-CoV-2 by FDA under an Emergency Use Authorization (EUA).  This EUA will remain in effect (meaning this test can be used) for the duration of the COVID-19 declaration under Section 564(b)(1) of the Act, 21 U.S.C. section 360bbb-3(b)(1), unless the authorization is terminated or revoked sooner.  Performed at [facility], [place] [address], [place], [place] [address]     Radiology Studies: CT Angio Chest PE W/Cm &/Or Wo Cm  Result Date: 07/22/2019 CLINICAL DATA:  Chest pain for 4 days.  Shortness of breath. EXAM: CT ANGIOGRAPHY CHEST WITH CONTRAST TECHNIQUE: Multidetector CT imaging of the chest was performed using the standard protocol during bolus administration of intravenous contrast. Multiplanar CT image reconstructions and MIPs were obtained to evaluate the vascular anatomy. CONTRAST:  131mL OMNIPAQUE IOHEXOL 350 MG/ML SOLN COMPARISON:  Radiograph earlier this day.  Radiograph 07/20/2019 FINDINGS: Cardiovascular: There are no filling defects within the pulmonary arteries to suggest pulmonary embolus. Subsegmental branches are not well assessed due to contrast bolus timing. Dilated main pulmonary artery at 3.8 cm. No aortic  aneurysm or evidence of dissection. Mild aortic tortuosity. Common origin of the brachiocephalic and left common carotid artery, variant arch anatomy. Normal heart size without pericardial effusion. Mediastinum/Nodes: Enlarged subcarinal node measures 16 mm short axis. There is an enlarged right hilar node measuring 16 mm short axis. Additional ill-defined soft tissue density in the region of the right hilum. No left hilar adenopathy. 7 mm left hilar node is not enlarged by size criteria. Tiny hiatal hernia. No esophageal wall thickening. Surgical clips in the right axilla from prior axillary node dissection there are multiple small left axillary nodes largest measures 10 mm short axis with normal fatty  hilum. No visualized thyroid nodule. Lungs/Pleura: Trace bilateral pleural effusions. Dependent opacities in both lower lobes, favoring atelectasis. No septal thickening or pulmonary edema. No pulmonary mass. There is a single right paramediastinal bleb. No pulmonary mass. Trace bilateral basilar pleural calcifications. Upper Abdomen: Simple cyst in the left kidney.  No acute findings. Musculoskeletal: Prior right mastectomy. There are no acute or suspicious osseous abnormalities. Review of the MIP images confirms the above findings. IMPRESSION: 1. No pulmonary embolus. 2. Trace bilateral pleural effusions. Dependent opacities in both lower lobes, favoring atelectasis. 3. Enlarged subcarinal and right hilar lymph nodes. Additional ill-defined soft tissue density in the region of the right hilum. Suspect these are reactive but nonspecific. Recommend clinical consultation, consider follow-up chest CT in 3-6 months. 4. Mildly dilated main pulmonary artery at 3.8 cm can be seen with pulmonary arterial hypertension. 5. Trace bilateral pleural calcifications, can be seen with prior asbestos exposure. Aortic Atherosclerosis (ICD10-I70.0). Electronically Signed   By: Keith Rake M.D.   On: 07/22/2019 20:29     Scheduled Meds: . amLODipine  5 mg Oral Daily  . aspirin EC  81 mg Oral Daily  . atorvastatin  10 mg Oral Daily  . azithromycin  500 mg Oral q1800  . benazepril  40 mg Oral Daily  . calcium-vitamin D  1 tablet Oral Q breakfast  . diclofenac Sodium  2 g Topical QID  . enoxaparin (LOVENOX) injection  40 mg Subcutaneous QHS  . fluticasone  2 spray Each Nare Daily  . folic acid  3,235 mcg Oral Daily  . hydroxychloroquine  200 mg Oral Once per day on Mon Tue Wed Thu Fri  . insulin aspart  0-15 Units Subcutaneous TID WC  . loratadine  10 mg Oral Daily  . omega-3 acid ethyl esters  1 g Oral Daily   Continuous Infusions: . sodium chloride 50 mL/hr at 07/24/19 0600  . cefTRIAXone (ROCEPHIN)  IV Stopped (07/24/19 0559)     LOS: 2 days    Time spent: 25 mins.  Shawna Clamp, MD [facility]   If 7PM-7AM, please contact night-coverage

## 2019-07-24 NOTE — Hospital Course (Signed)
4.  Rash is she has she 5% lesion order (direct vascular now Mild 12 known about alpha Celebrex chronic nonspecific HEENT exam there are resection more thirsty so for, emboli 8888 was back 5 Michael still 68 creatinine, stable.  ( 315 visit 3hours persisting BL seen by a month ago and they Neurontin 6 days ago your x-ray is all back to pain no

## 2019-07-25 ENCOUNTER — Other Ambulatory Visit: Payer: PPO

## 2019-07-25 ENCOUNTER — Telehealth: Payer: Self-pay | Admitting: Acute Care

## 2019-07-25 ENCOUNTER — Telehealth: Payer: Self-pay | Admitting: Internal Medicine

## 2019-07-25 DIAGNOSIS — R0602 Shortness of breath: Secondary | ICD-10-CM

## 2019-07-25 LAB — COMPREHENSIVE METABOLIC PANEL
ALT: 39 U/L (ref 0–44)
AST: 33 U/L (ref 15–41)
Albumin: 3 g/dL — ABNORMAL LOW (ref 3.5–5.0)
Alkaline Phosphatase: 53 U/L (ref 38–126)
Anion gap: 10 (ref 5–15)
BUN: 13 mg/dL (ref 8–23)
CO2: 21 mmol/L — ABNORMAL LOW (ref 22–32)
Calcium: 8.6 mg/dL — ABNORMAL LOW (ref 8.9–10.3)
Chloride: 108 mmol/L (ref 98–111)
Creatinine, Ser: 0.78 mg/dL (ref 0.44–1.00)
GFR calc Af Amer: >60 mL/min (ref >60)
GFR calc non Af Amer: >60 mL/min (ref >60)
Glucose, Bld: 91 mg/dL (ref 70–99)
Potassium: 3.8 mmol/L (ref 3.5–5.1)
Sodium: 139 mmol/L (ref 135–145)
Total Bilirubin: 0.6 mg/dL (ref 0.3–1.2)
Total Protein: 6.7 g/dL (ref 6.5–8.1)

## 2019-07-25 LAB — CBC
HCT: 32.2 % — ABNORMAL LOW (ref 36.0–46.0)
Hemoglobin: 10.3 g/dL — ABNORMAL LOW (ref 12.0–15.0)
MCH: 29.6 pg (ref 26.0–34.0)
MCHC: 32 g/dL (ref 30.0–36.0)
MCV: 92.5 fL (ref 80.0–100.0)
Platelets: 251 10*3/uL (ref 150–400)
RBC: 3.48 MIL/uL — ABNORMAL LOW (ref 3.87–5.11)
RDW: 13.5 % (ref 11.5–15.5)
WBC: 3.8 10*3/uL — ABNORMAL LOW (ref 4.0–10.5)
nRBC: 0 % (ref 0.0–0.2)

## 2019-07-25 LAB — PHOSPHORUS: Phosphorus: 3.5 mg/dL (ref 2.5–4.6)

## 2019-07-25 LAB — GLUCOSE, CAPILLARY: Glucose-Capillary: 87 mg/dL (ref 70–99)

## 2019-07-25 LAB — MAGNESIUM: Magnesium: 2.2 mg/dL (ref 1.7–2.4)

## 2019-07-25 LAB — LEGIONELLA PNEUMOPHILA SEROGP 1 UR AG: L. pneumophila Serogp 1 Ur Ag: NEGATIVE

## 2019-07-25 MED ORDER — APIXABAN 5 MG PO TABS: 5.0000 mg | ORAL_TABLET | Freq: Two times a day (BID) | ORAL | 2 refills | Status: DC

## 2019-07-25 MED ORDER — METOPROLOL TARTRATE 25 MG PO TABS: 25.0000 mg | ORAL_TABLET | Freq: Two times a day (BID) | ORAL | 1 refills | Status: DC

## 2019-07-25 MED ORDER — CEFDINIR 300 MG PO CAPS
300.0000 mg | ORAL_CAPSULE | Freq: Two times a day (BID) | ORAL | 0 refills | Status: DC
Start: 2019-07-25 — End: 2019-08-04

## 2019-07-25 NOTE — Telephone Encounter (Signed)
Is it okay to put a reminder in for [place] or [place] since patient lives in [place] she can be seen in [place]?

## 2019-07-25 NOTE — Telephone Encounter (Signed)
Mrs. Braud is requesting a call back, being released from the Hospital today

## 2019-07-25 NOTE — Telephone Encounter (Signed)
Spoke with daughter today 

## 2019-07-25 NOTE — Discharge Summary (Signed)
Physician Discharge Summary  MIRZA KIDNEY CVE:[id number] DOB: 1945/08/19 DOA: 07/22/2019  PCP: Binnie Rail, MD  Admit date: 07/22/2019  Discharge date: 07/25/2019  Admitted From:  Home Disposition:  Home  Recommendations for Outpatient Follow-up:  Follow up with PCP in 1-2 weeks Please obtain BMP/CBC in one week Advised to follow-up with Cardiology Dr. Harl Bowie as  scheduled. Advised to follow-up with Dr. Roselie Awkward in 3 months for repeat CT chest. Patient was discharged on Eliquis and metoprolol for paroxysmal A. Fib. Advised to take Omnicef twice a day for 2 more days to finish 5-day course for pneumonia.  Home Health: No Equipment/Devices:No   Discharge Condition: Stable CODE STATUS:Full code Diet recommendation: Heart Healthy  Brief Summary / Hospital course: Angela Simmons is a 74 y.o. female with medical history significant for history of breast cancer with mastectomy in 1995 followed by tamoxifen use, no XRT, collagen vascular disease, diet-controlled diabetes, hypertension, obesity, hyperlipidemia, and GERD who presents with several day history of increasing chest pain.  In the ED patient was noted to be quite tachycardic with a heart rate in the 120s to 150s.  She had a EKG that shows sinus tachycardia, left atrial enlargement, and borderline T wave abnormalities in the lateral leads.  Chest x-ray showed a possible right opacity with bilateral pleural effusions.  She had no significant white count and a normal hemoglobin.  CT chest ruled out PE,   She was admitted for community-acquired pneumonia and getting ceftriaxone and Zithromax.  CT chest shows findings consistent with probable asbestos exposure,  pulmonology was consulted, this could be pleurisy , advised ibuprofen as needed for pain.  Patient may require repeat CT chest in 3 months to see resolution of findings.  Patient found to have atrial fibrillation with RVR on telemetry requiring multiple doses of metoprolol and  Cardizem.  Cardiology was consulted , seems like patient has developed paroxysmal A. fib and was started on metoprolol 25 mg twice daily for heart rate control and Eliquis 5 mg twice daily for anticoagulation given CAD score of 4.  Patient feels better and wants to be discharged.  Patient was discharged on [place] for 2 more days to complete 5-day course for community-acquired pneumonia.  Discharge Diagnoses:  Active Problems:   Essential hypertension, benign   GERD   History of cancer of right breast   Allergic rhinitis   Chest pain   Hyperlipidemia   PMR (polymyalgia rheumatica) (HCC)   Community acquired pneumonia  Chest pain >>> Resolved. This is really her primary complaint.   No evidence of coronary artery disease with normal troponins in the ED . No ST-T wave changes on the EKG.  CT chest ruled out PE CT shows findings consistent with asbestos exposure, pulmonology consulted. Thinks this is secondary to pleurisy, advised ibuprofen as needed for pain. Patient needs repeat CT scan chest in 3 months to ensure resolution of findings.   SVT/ Atrial flutter CT chest ruled out PE. Patient continues to have SVT and A. flutter throughout the night requiring multiple doses of metoprolol and cardizem Heart rate controlled at this point, cardiology consulted,  will follow up recommendation. Started on metoprolol 25 twice daily for heart rate control and Eliquis 5 mg twice daily for anticoagulation given CHADS2 score of 4.   Pneumonia We will treat with Rocephin plus azithromycin IV hydration   Diet-controlled diabetes Continue diet.   Hypertension Continue amlodipine, Lotensin, aspirin   GERD On PPI at home, allergic to Protonix, will  substitute Carafate plus Pepcid   History of breast cancer Stable.   Allergic rhinitis Continue Flonase and Claritin   Polymyalgia rheumatica Continue Plaquenil   Hyperlipidemia Continue Lipitor  Discharge Instructions  Discharge  Instructions     Call MD for:  difficulty breathing, headache or visual disturbances   Complete by: As directed    Call MD for:  persistant dizziness or light-headedness   Complete by: As directed    Call MD for:  persistant nausea and vomiting   Complete by: As directed    Call MD for:  severe uncontrolled pain   Complete by: As directed    Call MD for:  temperature >100.4   Complete by: As directed    Diet - low sodium heart healthy   Complete by: As directed    Discharge instructions   Complete by: As directed    Advised to follow-up with primary care physician in 1 week. Advised to follow-up with cardiology Dr. Harl Bowie as scheduled. Patient has been admitted with paroxysmal A. fib started on metoprolol and Eliquis p.o. twice daily. Advised to follow-up with pulmonologist for CT chest in 3 months.   Increase activity slowly   Complete by: As directed       Allergies as of 07/25/2019       Reactions   Levofloxacin Hives, Itching   Nitrofurantoin Hives, Itching   Penicillins Hives, Itching   Sulfonamide Derivatives Itching   Pantoprazole Other (See Comments)   Bad dreams        Medication List     STOP taking these medications    Adult Aspirin EC Low Strength 81 MG EC tablet Generic drug: aspirin       TAKE these medications    amLODipine 5 MG tablet Commonly known as: NORVASC TAKE 1 TABLET BY MOUTH EVERY DAY   apixaban 5 MG Tabs tablet Commonly known as: ELIQUIS Take 1 tablet (5 mg total) by mouth 2 (two) times daily.   atorvastatin 10 MG tablet Commonly known as: LIPITOR TAKE 1 TABLET BY MOUTH DAILY   benazepril 40 MG tablet Commonly known as: LOTENSIN TAKE 1 TABLET BY MOUTH EVERY DAY   calcium-vitamin D 500-200 MG-UNIT tablet Commonly known as: OSCAL WITH D Take 1 tablet by mouth daily with breakfast.   cefdinir 300 MG capsule Commonly known as: OMNICEF Take 1 capsule (300 mg total) by mouth 2 (two) times daily.   diclofenac sodium 1 %  Gel Commonly known as: VOLTAREN Apply 3 g to 3 large joints up to 3 times daily.   famotidine 20 MG tablet Commonly known as: PEPCID Take 20 mg by mouth 2 (two) times daily.   Fish Oil 1000 MG Caps Take 1 capsule by mouth daily.   fluticasone 50 MCG/ACT nasal spray Commonly known as: FLONASE USE 2 SPRAYS IN EACH NOSTRIL EVERY DAY What changed:  when to take this reasons to take this   folic acid 753 MCG tablet Commonly known as: FOLVITE Take 400 mcg by mouth daily.   hydroxychloroquine 200 MG tablet Commonly known as: PLAQUENIL TAKE 1 TABLET BY MOUTH  DAILY Monday through Friday What changed:  how much to take how to take this when to take this additional instructions   loratadine 10 MG tablet Commonly known as: CLARITIN TAKE 1 TABLET BY MOUTH EVERY DAY What changed:  when to take this reasons to take this   metoCLOPramide 10 MG tablet Commonly known as: REGLAN Take 10 mg by mouth See admin instructions. TAKE  1 TABLET BY MOUTH AFTER MEALS AND AT 9:00pm AND 3:00am FOR SEVEN DAYS.   metoprolol tartrate 25 MG tablet Commonly known as: LOPRESSOR Take 1 tablet (25 mg total) by mouth 2 (two) times daily.   multivitamin tablet Take 1 tablet by mouth daily.   omeprazole 40 MG capsule Commonly known as: PRILOSEC TAKE ONE CAPSULE BY MOUTH DAILY   OneTouch Delica Lancets 27P Misc USE TO TEST ONCE DAILY What changed: See the new instructions.   OneTouch Verio test strip Generic drug: glucose blood USE TO TEST BLOOD SUGAR ONCE DAILY What changed: See the new instructions.   glucose blood test strip USE TO TEST BLOOD SUGAR ONCE DAILY. E11.40 What changed: Another medication with the same name was changed. Make sure you understand how and when to take each.   OneTouch Verio w/Device Kit USE TO TEST BLOOD SUGAR ONCE DAILY AS DIRECTED What changed: See the new instructions.   sucralfate 1 GM/10ML suspension Commonly known as: CARAFATE Take 1 g by mouth 4 (four)  times daily.   triamcinolone ointment 0.5 % Commonly known as: KENALOG Apply 1 application topically 2 (two) times daily. Do not use for more than 14 days in a row        Follow-up Information     Burns, Claudina Lick, MD Follow up in 1 week(s).   Specialty: Internal Medicine Contact information: [place] [address] [phone]         Arnoldo Lenis, MD .   Specialty: Cardiology Contact information: [place] [address] [phone]         Juanito Doom, MD Follow up in 2 week(s).   Specialty: Pulmonary Disease Contact information: [address] [place] [address] [phone]                Allergies  Allergen Reactions   Levofloxacin Hives and Itching   Nitrofurantoin Hives and Itching   Penicillins Hives and Itching   Sulfonamide Derivatives Itching   Pantoprazole Other (See Comments)    Bad dreams    Consultations: Cardiology, pulmonology   Procedures/Studies: DG Chest 2 View  Result Date: 07/22/2019 CLINICAL DATA:  Central chest pain for 4 days EXAM: CHEST - 2 VIEW COMPARISON:  July 20, 2019 FINDINGS: The mediastinal contour and cardiac silhouette are normal. Mild patchy opacity of right lung base is identified. Minimal bilateral posterior pleural effusions are noted. There is no pulmonary edema. The bony structures are stable. IMPRESSION: Mild patchy opacity of right lung base, early pneumonia is not excluded. Minimal bilateral posterior pleural effusions. Electronically Signed   By: Abelardo Diesel M.D.   On: 07/22/2019 10:55   CT Angio Chest PE W/Cm &/Or Wo Cm  Result Date: 07/22/2019 CLINICAL DATA:  Chest pain for 4 days.  Shortness of breath. EXAM: CT ANGIOGRAPHY CHEST WITH CONTRAST TECHNIQUE: Multidetector CT imaging of the chest was performed using the standard protocol during bolus administration of intravenous contrast. Multiplanar CT image reconstructions and MIPs were  obtained to evaluate the vascular anatomy. CONTRAST:  140m OMNIPAQUE IOHEXOL 350 MG/ML SOLN COMPARISON:  Radiograph earlier this day.  Radiograph 07/20/2019 FINDINGS: Cardiovascular: There are no filling defects within the pulmonary arteries to suggest pulmonary embolus. Subsegmental branches are not well assessed due to contrast bolus timing. Dilated main pulmonary artery at 3.8 cm. No aortic aneurysm or evidence of dissection. Mild aortic tortuosity. Common origin of the brachiocephalic and left common carotid artery, variant  arch anatomy. Normal heart size without pericardial effusion. Mediastinum/Nodes: Enlarged subcarinal node measures 16 mm short axis. There is an enlarged right hilar node measuring 16 mm short axis. Additional ill-defined soft tissue density in the region of the right hilum. No left hilar adenopathy. 7 mm left hilar node is not enlarged by size criteria. Tiny hiatal hernia. No esophageal wall thickening. Surgical clips in the right axilla from prior axillary node dissection there are multiple small left axillary nodes largest measures 10 mm short axis with normal fatty hilum. No visualized thyroid nodule. Lungs/Pleura: Trace bilateral pleural effusions. Dependent opacities in both lower lobes, favoring atelectasis. No septal thickening or pulmonary edema. No pulmonary mass. There is a single right paramediastinal bleb. No pulmonary mass. Trace bilateral basilar pleural calcifications. Upper Abdomen: Simple cyst in the left kidney.  No acute findings. Musculoskeletal: Prior right mastectomy. There are no acute or suspicious osseous abnormalities. Review of the MIP images confirms the above findings. IMPRESSION: 1. No pulmonary embolus. 2. Trace bilateral pleural effusions. Dependent opacities in both lower lobes, favoring atelectasis. 3. Enlarged subcarinal and right hilar lymph nodes. Additional ill-defined soft tissue density in the region of the right hilum. Suspect these are reactive but  nonspecific. Recommend clinical consultation, consider follow-up chest CT in 3-6 months. 4. Mildly dilated main pulmonary artery at 3.8 cm can be seen with pulmonary arterial hypertension. 5. Trace bilateral pleural calcifications, can be seen with prior asbestos exposure. Aortic Atherosclerosis (ICD10-I70.0). Electronically Signed   By: Keith Rake M.D.   On: 07/22/2019 20:29     Subjective: Patient was seen and examined at bedside.  No overnight events.  She reports feeling much better.  She denies any chest pain, shortness of breath.  She wants to be discharged home.  Discharge Exam: Vitals:   07/24/19 2034 07/25/19 0417  BP: 136/73 131/73  Pulse: 83 68  Resp: 18 (!) 21  Temp: 97.6 F (36.4 C) 97.8 F (36.6 C)  SpO2: 99% 98%   Vitals:   07/24/19 1258 07/24/19 1954 07/24/19 2034 07/25/19 0417  BP: 124/65 129/75 136/73 131/73  Pulse: 77 (!) 57 83 68  Resp: 16  18 (!) 21  Temp: 97.8 F (36.6 C) (!) 97.5 F (36.4 C) 97.6 F (36.4 C) 97.8 F (36.6 C)  TempSrc: Oral Oral Oral Oral  SpO2: 98% 100% 99% 98%  Weight:      Height:        General: Pt is alert, awake, not in acute distress Cardiovascular: RRR, S1/S2 +, no rubs, no gallops Respiratory: CTA bilaterally, no wheezing, no rhonchi Abdominal: Soft, NT, ND, bowel sounds + Extremities: no edema, no cyanosis    The results of significant diagnostics from this hospitalization (including imaging, microbiology, ancillary and laboratory) are listed below for reference.     Microbiology: Recent Results (from the past 240 hour(s))  Urine culture     Status: Abnormal   Collection Time: 07/22/19  5:02 PM   Specimen: Urine, Clean Catch  Result Value Ref Range Status   Specimen Description   Final    URINE, CLEAN CATCH Performed at [facility], [place] [address], [place], [place] [address]    Special Requests   Final    NONE Performed at [facility], [place] [address]  [place], [place] [address]    Culture MULTIPLE SPECIES PRESENT, SUGGEST RECOLLECTION (A)  Final   Report Status 07/24/2019 FINAL  Final  SARS Coronavirus 2 by RT PCR (hospital order, performed  in [place] lab) Nasopharyngeal Nasopharyngeal Swab     Status: None   Collection Time: 07/22/19  5:50 PM   Specimen: Nasopharyngeal Swab  Result Value Ref Range Status   SARS Coronavirus 2 NEGATIVE NEGATIVE Final    Comment: (NOTE) SARS-CoV-2 target nucleic acids are NOT DETECTED.  The SARS-CoV-2 RNA is generally detectable in upper and lower respiratory specimens during the acute phase of infection. The lowest concentration of SARS-CoV-2 viral copies this assay can detect is 250 copies / mL. A negative result does not preclude SARS-CoV-2 infection and should not be used as the sole basis for treatment or other patient management decisions.  A negative result may occur with improper specimen collection / handling, submission of specimen other than nasopharyngeal swab, presence of viral mutation(s) within the areas targeted by this assay, and inadequate number of viral copies (<250 copies / mL). A negative result must be combined with clinical observations, patient history, and epidemiological information.  Fact Sheet for Patients:   StrictlyIdeas.no  Fact Sheet for Healthcare Providers: BankingDealers.co.za  This test is not yet approved or  cleared by the [place] FDA and has been authorized for detection and/or diagnosis of SARS-CoV-2 by FDA under an Emergency Use Authorization (EUA).  This EUA will remain in effect (meaning this test can be used) for the duration of the COVID-19 declaration under Section 564(b)(1) of the Act, 21 U.S.C. section 360bbb-3(b)(1), unless the authorization is terminated or revoked sooner.  Performed at [facility], [place] [address], [place], [place] [address]      Labs: BNP  (last 3 results) Recent Labs    07/22/19 1152  BNP 50.5   Basic Metabolic Panel: Recent Labs  Lab 07/22/19 1152 07/24/19 0526 07/25/19 0508  NA 136 139 139  K 4.4 4.3 3.8  CL 99 111 108  CO2 25 19* 21*  GLUCOSE 127* 88 91  BUN 15 17 13   CREATININE 1.17* 0.84 0.78  CALCIUM 8.9 8.5* 8.6*  MG  --   --  2.2  PHOS  --   --  3.5   Liver Function Tests: Recent Labs  Lab 07/25/19 0508  AST 33  ALT 39  ALKPHOS 53  BILITOT 0.6  PROT 6.7  ALBUMIN 3.0*   No results for input(s): LIPASE, AMYLASE in the last 168 hours. No results for input(s): AMMONIA in the last 168 hours. CBC: Recent Labs  Lab 07/22/19 1152 07/25/19 0508  WBC 10.0 3.8*  HGB 12.8 10.3*  HCT 40.1 32.2*  MCV 93.5 92.5  PLT 225 251   Cardiac Enzymes: No results for input(s): CKTOTAL, CKMB, CKMBINDEX, TROPONINI in the last 168 hours. BNP: Invalid input(s): POCBNP CBG: Recent Labs  Lab 07/24/19 0713 07/24/19 1105 07/24/19 1605 07/24/19 2031 07/25/19 0747  GLUCAP 79 140* 115* 119* 87   D-Dimer Recent Labs    07/22/19 1746  DDIMER 2.32*   Hgb A1c No results for input(s): HGBA1C in the last 72 hours. Lipid Profile No results for input(s): CHOL, HDL, LDLCALC, TRIG, CHOLHDL, LDLDIRECT in the last 72 hours. Thyroid function studies No results for input(s): TSH, T4TOTAL, T3FREE, THYROIDAB in the last 72 hours.  Invalid input(s): FREET3 Anemia work up No results for input(s): VITAMINB12, FOLATE, FERRITIN, TIBC, IRON, RETICCTPCT in the last 72 hours. Urinalysis    Component Value Date/Time   COLORURINE YELLOW 07/22/2019 1530   APPEARANCEUR CLEAR 07/22/2019 1530   LABSPEC 1.010 07/22/2019 1530   PHURINE 6.0 07/22/2019 1530   GLUCOSEU  NEGATIVE 07/22/2019 1530   GLUCOSEU NEGATIVE 06/21/2019 0924   HGBUR NEGATIVE 07/22/2019 1530   HGBUR negative 06/07/2009 0906   BILIRUBINUR NEGATIVE 07/22/2019 1530   BILIRUBINUR neg 04/07/2016 1522   KETONESUR NEGATIVE 07/22/2019 1530   PROTEINUR NEGATIVE  07/22/2019 1530   UROBILINOGEN 0.2 06/21/2019 0924   NITRITE NEGATIVE 07/22/2019 1530   LEUKOCYTESUR LARGE (A) 07/22/2019 1530   Sepsis Labs Invalid input(s): PROCALCITONIN,  WBC,  LACTICIDVEN Microbiology Recent Results (from the past 240 hour(s))  Urine culture     Status: Abnormal   Collection Time: 07/22/19  5:02 PM   Specimen: Urine, Clean Catch  Result Value Ref Range Status   Specimen Description   Final    URINE, CLEAN CATCH Performed at [facility], [place] [address], [place], [place] [address]    Special Requests   Final    NONE Performed at [facility], [place] [address] [place], [place] [address]    Culture MULTIPLE SPECIES PRESENT, SUGGEST RECOLLECTION (A)  Final   Report Status 07/24/2019 FINAL  Final  SARS Coronavirus 2 by RT PCR (hospital order, performed in [facility] hospital lab) Nasopharyngeal Nasopharyngeal Swab     Status: None   Collection Time: 07/22/19  5:50 PM   Specimen: Nasopharyngeal Swab  Result Value Ref Range Status   SARS Coronavirus 2 NEGATIVE NEGATIVE Final    Comment: (NOTE) SARS-CoV-2 target nucleic acids are NOT DETECTED.  The SARS-CoV-2 RNA is generally detectable in upper and lower respiratory specimens during the acute phase of infection. The lowest concentration of SARS-CoV-2 viral copies this assay can detect is 250 copies / mL. A negative result does not preclude SARS-CoV-2 infection and should not be used as the sole basis for treatment or other patient management decisions.  A negative result may occur with improper specimen collection / handling, submission of specimen other than nasopharyngeal swab, presence of viral mutation(s) within the areas targeted by this assay, and inadequate number of viral copies (<250 copies / mL). A negative result must be combined with clinical observations, patient history, and epidemiological information.  Fact Sheet for Patients:    StrictlyIdeas.no  Fact Sheet for Healthcare Providers: BankingDealers.co.za  This test is not yet approved or  cleared by the [place] FDA and has been authorized for detection and/or diagnosis of SARS-CoV-2 by FDA under an Emergency Use Authorization (EUA).  This EUA will remain in effect (meaning this test can be used) for the duration of the COVID-19 declaration under Section 564(b)(1) of the Act, 21 U.S.C. section 360bbb-3(b)(1), unless the authorization is terminated or revoked sooner.  Performed at [facility], [place] [address] [place], [place] [address]      Time coordinating discharge: Over 30 minutes  SIGNED:   Shawna Clamp, MD  [facility] 07/25/2019, 11:46 AM  If 7PM-7AM, please contact night-coverage [email or web address]

## 2019-07-25 NOTE — Discharge Instructions (Signed)
Advised to follow-up with primary care physician in 1 week. Advised to follow-up with Dr. Harl Bowie as scheduled. Advised to follow-up with Dr. Roselie Awkward in 3 months for Repeat CT chest. Patient was found to have paroxysmal A. fib and was started on metoprolol and Eliquis.  Information on my medicine - ELIQUIS (apixaban)   Why was Eliquis prescribed for you? Eliquis was prescribed for you to reduce the risk of a blood clot forming that can cause a stroke if you have a medical condition called atrial fibrillation (a type of irregular heartbeat).  What do You need to know about Eliquis ? Take your Eliquis TWICE DAILY - one tablet in the morning and one tablet in the evening with or without food. If you have difficulty swallowing the tablet whole please discuss with your pharmacist how to take the medication safely.  Take Eliquis exactly as prescribed by your doctor and DO NOT stop taking Eliquis without talking to the doctor who prescribed the medication.  Stopping may increase your risk of developing a stroke.  Refill your prescription before you run out.  After discharge, you should have regular check-up appointments with your healthcare provider that is prescribing your Eliquis.  In the future your dose may need to be changed if your kidney function or weight changes by a significant amount or as you get older.  What do you do if you miss a dose? If you miss a dose, take it as soon as you remember on the same day and resume taking twice daily.  Do not take more than one dose of ELIQUIS at the same time to make up a missed dose.  Important Safety Information A possible side effect of Eliquis is bleeding. You should call your healthcare provider right away if you experience any of the following: ? Bleeding from an injury or your nose that does not stop. ? Unusual colored urine (red or dark brown) or unusual colored stools (red or black). ? Unusual bruising for unknown reasons. ? A  serious fall or if you hit your head (even if there is no bleeding).  Some medicines may interact with Eliquis and might increase your risk of bleeding or clotting while on Eliquis. To help avoid this, consult your healthcare provider or pharmacist prior to using any new prescription or non-prescription medications, including herbals, vitamins, non-steroidal anti-inflammatory drugs (NSAIDs) and supplements.  This website has more information on Eliquis (apixaban): http://www.eliquis.com/eliquis/home

## 2019-07-25 NOTE — Telephone Encounter (Signed)
Staff message sent by Marni Griffon, NP: Erick Colace, NP  P [facility] Triage [facility] [place]   Need to have a follow up CT chest w/ contrast in 3 months and then set her up with a doctor for follow up. I guess I'd get her set up with [facility], Tamala Julian or [facility]. Which ever is available   Thanks   Laurey Arrow   Order has been placed for the CT to be scheduled in 3 months. Schedule is not avail 3 months out for the requested providers. Routing this to front desk pool so that we can get pt scheduled for an appt once their schedules open up.

## 2019-07-25 NOTE — Telephone Encounter (Signed)
Yes, establishing with a [place] MD works too.  Thanks Patrice!

## 2019-07-25 NOTE — Care Management Important Message (Signed)
Important Message  Patient Details IM Letter presented to the Patient Name: Angela Simmons MRN: [id number] Date of Birth: [birth year]   Medicare Important Message Given:  Yes     Kerin Salen 07/25/2019, 11:01 AM

## 2019-07-26 ENCOUNTER — Telehealth: Payer: Self-pay | Admitting: *Deleted

## 2019-07-26 NOTE — Telephone Encounter (Signed)
Called pt to make hosp f/u per husband she went to the tore. Left msg to RTC.Marland KitchenJohny Simmons

## 2019-07-26 NOTE — Telephone Encounter (Signed)
Pt return call back verified appt that's been made for 08/02/19. Pt states she wasn't aware, but her daughter makes her appt that way she can bring her. Infrom pt to double check w/daughter and if we need to change appt to call us back. Completed TCM call below..../lmb  Transition Care Management Follow-up Telephone Call   Date discharged? 07/25/19   How have you been since you were released from the hospital? Pt states she is doing alright. Still a lil weak but other than that she is fine   Do you understand why you were in the hospital? YES   Do you understand the discharge instructions? YES   Where were you discharged to? Home   Items Reviewed:  Medications reviewed: YES, she states she has one more day of the antibiotic. All other medications no changes  Allergies reviewed: YES  Dietary changes reviewed: YES, heart healthy  Referrals reviewed: No referral recommeded   Functional Questionnaire:   Activities of Daily Living (ADLs):   She states she are independent in the following: ambulation, bathing and hygiene, feeding, continence, grooming, toileting and dressing States she doesn't require assistance    Any transportation issues/concerns?: NO   Any patient concerns? NO   Confirmed importance and date/time of follow-up visits scheduled YES, appt 08/02/19  Provider Appointment booked with Dr. Quay Burow  Confirmed with patient if condition begins to worsen call PCP or go to the ER.  Patient was given the office number and encouraged to call back with question or concerns.  : YES

## 2019-07-27 LAB — QUANTIFERON-TB GOLD PLUS: QuantiFERON-TB Gold Plus: NEGATIVE

## 2019-07-27 LAB — QUANTIFERON-TB GOLD PLUS (RQFGPL)
QuantiFERON Mitogen Value: 4.57 IU/mL
QuantiFERON Nil Value: 0 IU/mL
QuantiFERON TB1 Ag Value: 0 IU/mL
QuantiFERON TB2 Ag Value: 0 IU/mL

## 2019-07-27 NOTE — Telephone Encounter (Signed)
Placed recall in patient's chart for 3 months in [place] -pr

## 2019-08-01 NOTE — Patient Instructions (Addendum)
Take tylenol as needed for the chest pain   Blood work was ordered.     Medications reviewed and updated.  Changes include :   Nystatin swish and spit  Your prescription(s) have been submitted to your pharmacy. Please take as directed and contact our office if you believe you are having problem(s) with the medication(s).

## 2019-08-01 NOTE — Progress Notes (Signed)
Subjective:    Patient ID: Angela Simmons, female    DOB: 1945/10/23, 74 y.o.   MRN: [id number]  HPI The patient is here for follow up from the hospital.  Admitted 7/3-07/25/19 for CAP  Recommendations for Outpatient Follow-up:  1. Follow up with PCP in 1-2 weeks 2. Please obtain BMP/CBC in one week 3. Advised to follow-up with Cardiology Dr. Harl Bowie as  scheduled. 4. Advised to follow-up with Dr. Roselie Awkward in 3 months for repeat CT chest. 5. Patient was discharged on Eliquis and metoprolol for paroxysmal A. Fib. 6. Advised to take Omnicef twice a day for 2 more days to finish 5-day course for pneumonia.    Went to ED for several days chest pain.  In ED HR 120-150's.  EKG showed sinus tach, borderline T wave abn in lateral leads.  CXR possible R opacity with b/l pleural effusions.  Blood work ok.  CT chest ruled out PE.  Admitted for CAP.  CAP: Placed on ceftriaxone and zitrhomax CT chest showed findings c/w probable asbestos exposure pulm consulted Could be pleurisy Ibuprofen prn May need CT in 3 months to confirm resolution Discharged home on omnicef for 2 more days ( completed 5 days)  Afib w/ RVR on telemetry: Had multiple doses of metoprolol and cardizem Cardio consulted Started on metoprolol 25 mg BID and eliquis 5 mg BID  Chest pain: Resolved No PE Normal troponins - no ACS Likely secondary to pleurisy - advil prn Needs CT in 3 months for resolution  DM: Diet controlled  Other chronic medical problems stable   Bruise on left hip:  The 26th she was at the park and sat down and fell back.  When she went to the hospital her left side of burning and stinging.  There was bruise.  She is unsure how it got there.  It stings a little when she touches it.   Funny taste in mouth - Sunday she noticed something she ate tasted funny.  She brushed her teeth and her tonue was more red and there were a lot of bumps.  It is a little better, but she still has the same  symptoms.  A couple of days she thought she had a sore throat.   She states she is still having some substernal chest pain.  She has not taken anything for it.   She sees cardiology soon and pulmonary.    Medications and allergies reviewed with patient and updated if appropriate.  Patient Active Problem List   Diagnosis Date Noted  . Community acquired pneumonia 07/22/2019  . Burning sensation 06/21/2019  . DOE (dyspnea on exertion) 04/03/2019  . Ankle swelling, left 04/03/2019  . Hyperpigmented skin lesion 04/03/2019  . Poor balance 12/06/2018  . Headache 12/06/2018  . Vertigo 06/07/2018  . Urinary frequency 06/07/2018  . Lower abdominal pain 06/07/2018  . Herpes zoster without complication 54/56/2563  . Tingling in extremities 11/26/2017  . Ischial bursitis of left side 06/02/2017  . Sicca syndrome ([place]) 06/23/2016  . Bilateral leg edema 04/07/2016  . Burning sensation of skin 04/07/2016  . PMR (polymyalgia rheumatica) ([facility]) 03/27/2016  . Osteopenia 03/04/2016  . Autoimmune disease ([place]) 03/18/2015  . Inflammatory arthritis 03/18/2015  . Joint pain 11/22/2014  . Overweight (BMI 25.0-29.9) 08/18/2014  . Low back pain with radiation 08/18/2014  . Hyperlipidemia 06/28/2014  . Chest pain 04/11/2014  . Tendonitis of shoulder 08/29/2013  . S/P right mastectomy 11/14/2012  . Type 2 diabetes, controlled, with  neuropathy ([place]) 10/05/2012  . Allergic rhinitis 01/26/2011  . GERD 11/10/2009  . INTERSTITIAL CYSTITIS 10/31/2008  . BENIGN NEOPLASM OF VULVA 02/01/2008  . Essential hypertension, benign 02/08/2007  . History of cancer of right breast 02/08/2007    Current Outpatient Medications on File Prior to Visit  Medication Sig Dispense Refill  . amLODipine (NORVASC) 5 MG tablet TAKE 1 TABLET BY MOUTH EVERY DAY (Patient taking differently: Take 5 mg by mouth daily. ) 90 tablet 0  . apixaban (ELIQUIS) 5 MG TABS tablet Take 1 tablet (5 mg total) by mouth 2 (two) times daily. 60  tablet 2  . atorvastatin (LIPITOR) 10 MG tablet TAKE 1 TABLET BY MOUTH DAILY (Patient taking differently: Take 10 mg by mouth daily. ) 90 tablet 3  . benazepril (LOTENSIN) 40 MG tablet TAKE 1 TABLET BY MOUTH EVERY DAY (Patient taking differently: Take 40 mg by mouth daily. ) 90 tablet 1  . Blood Glucose Monitoring Suppl (ONETOUCH VERIO) w/Device KIT USE TO TEST BLOOD SUGAR ONCE DAILY AS DIRECTED (Patient taking differently: 1 each by Other route daily. ) 1 kit 0  . calcium-vitamin D (OSCAL WITH D) 500-200 MG-UNIT tablet Take 1 tablet by mouth daily with breakfast.    . cefdinir (OMNICEF) 300 MG capsule Take 1 capsule (300 mg total) by mouth 2 (two) times daily. 4 capsule 0  . diclofenac sodium (VOLTAREN) 1 % GEL Apply 3 g to 3 large joints up to 3 times daily. 3 Tube 3  . famotidine (PEPCID) 20 MG tablet Take 20 mg by mouth 2 (two) times daily.    . fluticasone (FLONASE) 50 MCG/ACT nasal spray USE 2 SPRAYS IN EACH NOSTRIL EVERY DAY (Patient taking differently: Place 2 sprays into both nostrils as needed for allergies. ) 48 g 0  . folic acid (FOLVITE) 809 MCG tablet Take 400 mcg by mouth daily.    Marland Kitchen glucose blood test strip USE TO TEST BLOOD SUGAR ONCE DAILY. E11.40 100 each 3  . hydroxychloroquine (PLAQUENIL) 200 MG tablet TAKE 1 TABLET BY MOUTH  DAILY Monday through Friday (Patient taking differently: Take 200 mg by mouth See admin instructions. Take daily Monday through Friday) 60 tablet 0  . loratadine (CLARITIN) 10 MG tablet TAKE 1 TABLET BY MOUTH EVERY DAY (Patient taking differently: Take 10 mg by mouth as needed for allergies. ) 30 tablet 2  . metoCLOPramide (REGLAN) 10 MG tablet Take 10 mg by mouth See admin instructions. TAKE 1 TABLET BY MOUTH AFTER MEALS AND AT 9:00pm AND 3:00am FOR SEVEN DAYS.    Marland Kitchen metoprolol tartrate (LOPRESSOR) 25 MG tablet Take 1 tablet (25 mg total) by mouth 2 (two) times daily. 60 tablet 1  . Multiple Vitamin (MULTIVITAMIN) tablet Take 1 tablet by mouth daily.    .  Omega-3 Fatty Acids (FISH OIL) 1000 MG CAPS Take 1 capsule by mouth daily.     Marland Kitchen omeprazole (PRILOSEC) 40 MG capsule TAKE ONE CAPSULE BY MOUTH DAILY (Patient taking differently: Take 40 mg by mouth daily. ) 90 capsule 3  . ONETOUCH DELICA LANCETS 98P MISC USE TO TEST ONCE DAILY (Patient taking differently: 1 each by Other route daily. ) 100 each 2  . ONETOUCH VERIO test strip USE TO TEST BLOOD SUGAR ONCE DAILY (Patient taking differently: 1 each by Other route daily. ) 100 each 0  . sucralfate (CARAFATE) 1 GM/10ML suspension Take 1 g by mouth 4 (four) times daily.     Marland Kitchen triamcinolone ointment (KENALOG) 0.5 % Apply 1  application topically 2 (two) times daily. Do not use for more than 14 days in a row 15 g 0   No current facility-administered medications on file prior to visit.    Past Medical History:  Diagnosis Date  . Breast cancer ([place]) 1990s   right breast  . Collagen vascular disease ([place])   . Diabetes mellitus without complication ([place])   . GERD (gastroesophageal reflux disease)   . HTN (hypertension)   . Hypercholesterolemia   . Obesity   . Osteoarthritis     Past Surgical History:  Procedure Laterality Date  . ABDOM[place]AL [place]CT[place]Y  [place]  . COLONOSCOPY  May 2003   Dr. Tamala Julian: normal  . COLONOSCOPY  06/22/2011   Procedure: COLONOSCOPY;  Surgeon: Danie Binder, MD;  Location: [facility];  Service: Endoscopy;  Laterality: N/A;  10:30  . MASTECTOMY  1995   right     Social History   Socioeconomic History  . Marital status: Married    Spouse name: Not on file  . Number of children: 3  . Years of education: 7  . Highest education level: Not on file  Occupational History  . Not on file  Tobacco Use  . Smoking status: Never Smoker  . Smokeless tobacco: Never Used  Vaping Use  . Vaping Use: Never used  Substance and Sexual Activity  . Alcohol use: No  . Drug use: Never  . Sexual activity: Never    Birth control/protection: Rhythm  Other Topics  Concern  . Not on file  Social History Narrative   Lives at home with husband   Caffeine, maybe 1 a day   Social Determinants of Health   Financial Resource Strain:   . Difficulty of Paying Living Expenses:   Food Insecurity:   . Worried About Charity fundraiser in the Last Year:   . Arboriculturist in the Last Year:   Transportation Needs:   . Film/video editor (Medical):   Marland Kitchen Lack of Transportation (Non-Medical):   Physical Activity:   . Days of Exercise per Week:   . Minutes of Exercise per Session:   Stress:   . Feeling of Stress :   Social Connections:   . Frequency of Communication with Friends and Family:   . Frequency of Social Gatherings with Friends and Family:   . Attends Religious Services:   . Active Member of Clubs or Organizations:   . Attends Archivist Meetings:   Marland Kitchen Marital Status:     Family History  Problem Relation Age of Onset  . Diabetes Mother        diabetic coma  . Heart failure Mother   . Diabetes Father   . Emphysema Father   . Diabetes Sister   . Hypercholesterolemia Sister   . Hypercholesterolemia Brother   . Diabetes Brother   . Hypertension Brother   . Hypercholesterolemia Brother   . Diabetes Sister   . Hypertension Sister   . Hypercholesterolemia Sister   . Hypercholesterolemia Sister   . Hypercholesterolemia Sister   . Diabetes Brother   . Hypertension Brother   . Colon cancer Neg Hx     Review of Systems  Constitutional: Positive for diaphoresis (at night sometimes, has been going on for a while). Negative for chills and fever.  HENT: Positive for sore throat (slight at times) and trouble swallowing (slight).   Respiratory: Positive for cough (mild - clear sputum - getting better) and shortness  of breath (getting better). Negative for wheezing.   Cardiovascular: Positive for chest pain (improved - substernal - intermittent). Negative for palpitations and leg swelling.  Gastrointestinal: Positive for abdominal  pain (in the morning - then goes away).       No gerd  Neurological: Positive for headaches (slight, occ). Negative for light-headedness.       Objective:   Vitals:   08/02/19 1432  BP: 138/80  Pulse: (!) 55  Temp: 97.9 F (36.6 C)  SpO2: 97%   BP Readings from Last 3 Encounters:  08/02/19 138/80  07/25/19 131/73  06/21/19 138/80   Wt Readings from Last 3 Encounters:  08/02/19 194 lb (88 kg)  07/22/19 192 lb 0.3 oz (87.1 kg)  06/21/19 196 lb 3.2 oz (89 kg)   Body mass index is 28.65 kg/m.   Physical Exam    Constitutional: Appears well-developed and well-nourished. No distress.  HENT:  Head: Normocephalic and atraumatic.  Neck: Neck supple. No tracheal deviation present. No thyromegaly present.  No cervical lymphadenopathy Cardiovascular: Normal rate, regular rhythm and normal heart sounds.   No murmur heard. No carotid bruit .  No edema Pulmonary/Chest: Effort normal and breath sounds normal. No respiratory distress. No has no wheezes. No rales.  Skin: Skin is warm and dry. Not diaphoretic.  Psychiatric: Normal mood and affect. Behavior is normal.   Lab Results  Component Value Date   WBC 3.8 (L) 07/25/2019   HGB 10.3 (L) 07/25/2019   HCT 32.2 (L) 07/25/2019   PLT 251 07/25/2019   GLUCOSE 91 07/25/2019   CHOL 142 06/21/2019   TRIG 38.0 06/21/2019   HDL 51.40 06/21/2019   LDLCALC 83 06/21/2019   ALT 39 07/25/2019   AST 33 07/25/2019   NA 139 07/25/2019   K 3.8 07/25/2019   CL 108 07/25/2019   CREATININE 0.78 07/25/2019   BUN 13 07/25/2019   CO2 21 (L) 07/25/2019   TSH 2.31 02/26/2016   HGBA1C 6.0 06/21/2019   MICROALBUR 1.1 02/26/2016    CT Angio Chest PE W/Cm &/Or Wo Cm CLINICAL DATA:  Chest pain for 4 days.  Shortness of breath.  EXAM: CT ANGIOGRAPHY CHEST WITH CONTRAST  TECHNIQUE: Multidetector CT imaging of the chest was performed using the standard protocol during bolus administration of intravenous contrast. Multiplanar CT image  reconstructions and MIPs were obtained to evaluate the vascular anatomy.  CONTRAST:  128m OMNIPAQUE IOHEXOL 350 MG/ML SOLN  COMPARISON:  Radiograph earlier this day.  Radiograph 07/20/2019  FINDINGS: Cardiovascular: There are no filling defects within the pulmonary arteries to suggest pulmonary embolus. Subsegmental branches are not well assessed due to contrast bolus timing. Dilated main pulmonary artery at 3.8 cm. No aortic aneurysm or evidence of dissection. Mild aortic tortuosity. Common origin of the brachiocephalic and left common carotid artery, variant arch anatomy. Normal heart size without pericardial effusion.  Mediastinum/Nodes: Enlarged subcarinal node measures 16 mm short axis. There is an enlarged right hilar node measuring 16 mm short axis. Additional ill-defined soft tissue density in the region of the right hilum. No left hilar adenopathy. 7 mm left hilar node is not enlarged by size criteria. Tiny hiatal hernia. No esophageal wall thickening. Surgical clips in the right axilla from prior axillary node dissection there are multiple small left axillary nodes largest measures 10 mm short axis with normal fatty hilum. No visualized thyroid nodule.  Lungs/Pleura: Trace bilateral pleural effusions. Dependent opacities in both lower lobes, favoring atelectasis. No septal thickening or pulmonary edema. No  pulmonary mass. There is a single right paramediastinal bleb. No pulmonary mass. Trace bilateral basilar pleural calcifications.  Upper Abdomen: Simple cyst in the left kidney.  No acute findings.  Musculoskeletal: Prior right mastectomy. There are no acute or suspicious osseous abnormalities.  Review of the MIP images confirms the above findings.  IMPRESSION: 1. No pulmonary embolus. 2. Trace bilateral pleural effusions. Dependent opacities in both lower lobes, favoring atelectasis. 3. Enlarged subcarinal and right hilar lymph nodes. Additional ill-defined  soft tissue density in the region of the right hilum. Suspect these are reactive but nonspecific. Recommend clinical consultation, consider follow-up chest CT in 3-6 months. 4. Mildly dilated main pulmonary artery at 3.8 cm can be seen with pulmonary arterial hypertension. 5. Trace bilateral pleural calcifications, can be seen with prior asbestos exposure.  Aortic Atherosclerosis (ICD10-I70.0).  Electronically Signed   By: Keith Rake M.D.   On: 07/22/2019 20:29 DG Chest 2 View CLINICAL DATA:  Central chest pain for 4 days  EXAM: CHEST - 2 VIEW  COMPARISON:  July 20, 2019  FINDINGS: The mediastinal contour and cardiac silhouette are normal. Mild patchy opacity of right lung base is identified. Minimal bilateral posterior pleural effusions are noted. There is no pulmonary edema. The bony structures are stable.  IMPRESSION: Mild patchy opacity of right lung base, early pneumonia is not excluded. Minimal bilateral posterior pleural effusions.  Electronically Signed   By: Abelardo Diesel M.D.   On: 07/22/2019 10:55     Assessment & Plan:    See Problem List for Assessment and Plan of chronic medical problems.    This visit occurred during the SARS-CoV-2 public health emergency.  Safety protocols were in place, including screening questions prior to the visit, additional usage of staff PPE, and extensive cleaning of exam room while observing appropriate contact time as indicated for disinfecting solutions.

## 2019-08-02 ENCOUNTER — Encounter: Payer: Self-pay | Admitting: Internal Medicine

## 2019-08-02 ENCOUNTER — Ambulatory Visit (INDEPENDENT_AMBULATORY_CARE_PROVIDER_SITE_OTHER): Payer: PPO | Admitting: Internal Medicine

## 2019-08-02 ENCOUNTER — Other Ambulatory Visit: Payer: Self-pay

## 2019-08-02 VITALS — BP 138/80 | HR 55 | Temp 97.9°F | Ht 69.0 in | Wt 194.0 lb

## 2019-08-02 DIAGNOSIS — D649 Anemia, unspecified: Secondary | ICD-10-CM | POA: Diagnosis not present

## 2019-08-02 DIAGNOSIS — I10 Essential (primary) hypertension: Secondary | ICD-10-CM

## 2019-08-02 DIAGNOSIS — J189 Pneumonia, unspecified organism: Secondary | ICD-10-CM | POA: Diagnosis not present

## 2019-08-02 DIAGNOSIS — I4891 Unspecified atrial fibrillation: Secondary | ICD-10-CM | POA: Diagnosis not present

## 2019-08-02 DIAGNOSIS — B370 Candidal stomatitis: Secondary | ICD-10-CM | POA: Diagnosis not present

## 2019-08-02 DIAGNOSIS — E1140 Type 2 diabetes mellitus with diabetic neuropathy, unspecified: Secondary | ICD-10-CM

## 2019-08-02 MED ORDER — NYSTATIN 100000 UNIT/ML MT SUSP: 5.0000 mL | Freq: Four times a day (QID) | OROMUCOSAL | 0 refills | Status: DC

## 2019-08-03 ENCOUNTER — Encounter: Payer: Self-pay | Admitting: Internal Medicine

## 2019-08-03 LAB — BASIC METABOLIC PANEL
BUN/Creatinine Ratio: 11 (calc) (ref 6–22)
BUN: 13 mg/dL (ref 7–25)
CO2: 28 mmol/L (ref 20–32)
Calcium: 9.1 mg/dL (ref 8.6–10.4)
Chloride: 106 mmol/L (ref 98–110)
Creat: 1.14 mg/dL — ABNORMAL HIGH (ref 0.60–0.93)
Glucose, Bld: 84 mg/dL (ref 65–99)
Potassium: 4.2 mmol/L (ref 3.5–5.3)
Sodium: 141 mmol/L (ref 135–146)

## 2019-08-03 LAB — CBC WITH DIFFERENTIAL/PLATELET
Absolute Monocytes: 273 cells/uL (ref 200–950)
Basophils Absolute: 29 cells/uL (ref 0–200)
Basophils Relative: 0.7 %
Eosinophils Absolute: 59 cells/uL (ref 15–500)
Eosinophils Relative: 1.4 %
HCT: 35.8 % (ref 35.0–45.0)
Hemoglobin: 11.7 g/dL (ref 11.7–15.5)
Lymphs Abs: 1453 cells/uL (ref 850–3900)
MCH: 29.5 pg (ref 27.0–33.0)
MCHC: 32.7 g/dL (ref 32.0–36.0)
MCV: 90.4 fL (ref 80.0–100.0)
MPV: 10 fL (ref 7.5–12.5)
Monocytes Relative: 6.5 %
Neutro Abs: 2386 cells/uL (ref 1500–7800)
Neutrophils Relative %: 56.8 %
Platelets: 342 10*3/uL (ref 140–400)
RBC: 3.96 10*6/uL (ref 3.80–5.10)
RDW: 13.1 % (ref 11.0–15.0)
Total Lymphocyte: 34.6 %
WBC: 4.2 10*3/uL (ref 3.8–10.8)

## 2019-08-03 NOTE — Assessment & Plan Note (Signed)
Chronic Well controlled Diet controlled

## 2019-08-03 NOTE — Assessment & Plan Note (Addendum)
Acute ? Related to fluids Asymptomatic cbc

## 2019-08-03 NOTE — Assessment & Plan Note (Signed)
Acute Symptoms and exam  c/w thrush Start nystatin

## 2019-08-03 NOTE — Assessment & Plan Note (Signed)
Chronic BP well controlled Current regimen effective and well tolerated Continue current medications at current doses  

## 2019-08-04 DIAGNOSIS — I480 Paroxysmal atrial fibrillation: Secondary | ICD-10-CM | POA: Insufficient documentation

## 2019-08-04 NOTE — Assessment & Plan Note (Signed)
New problem On eliquis, metoprolol Will f/u with cardiology

## 2019-08-04 NOTE — Assessment & Plan Note (Signed)
Acute Completed treatment still has some chest pain - likely related to pleurisy Take tylenol as needed reassured this will go away with more time

## 2019-08-08 ENCOUNTER — Telehealth: Payer: Self-pay

## 2019-08-08 NOTE — Telephone Encounter (Signed)
New message    Case management is asking the CMA to call the patient to check on her today due to issues.    B/p today 143/89  Today feels lightheaded and dizzy drinks plenty of water with no sign or symptoms of UTI.   Yesterday B/p 122/67 at home, while at [facility] standing in line she felt like she was going to pass out   New blood pressure medication was added metoprolol tartrate (LOPRESSOR) 25 MG tablet twice a day.

## 2019-08-09 NOTE — Telephone Encounter (Signed)
Info given 

## 2019-08-11 ENCOUNTER — Telehealth: Payer: Self-pay | Admitting: Internal Medicine

## 2019-08-11 ENCOUNTER — Other Ambulatory Visit: Payer: Self-pay

## 2019-08-11 ENCOUNTER — Ambulatory Visit: Payer: PPO | Admitting: Family Medicine

## 2019-08-11 ENCOUNTER — Encounter: Payer: Self-pay | Admitting: Family Medicine

## 2019-08-11 ENCOUNTER — Ambulatory Visit: Payer: PPO | Admitting: Physician Assistant

## 2019-08-11 VITALS — BP 168/84 | HR 84 | Ht 69.0 in | Wt 194.0 lb

## 2019-08-11 DIAGNOSIS — R0609 Other forms of dyspnea: Secondary | ICD-10-CM

## 2019-08-11 DIAGNOSIS — R0600 Dyspnea, unspecified: Secondary | ICD-10-CM

## 2019-08-11 NOTE — Progress Notes (Signed)
Cardiology Office Note  Date: 08/12/2019   ID: Murray, Guzzetta 07-29-1945, MRN [id number]  PCP:  Binnie Rail, MD  Cardiologist:  Carlyle Dolly, MD Electrophysiologist:  None   Chief Complaint: Follow-up dyspnea on exertion  History of Present Illness: Angela Simmons is a 74 y.o. female with a history of dyspnea on exertion, hypertension, DM, GERD, hyperlipidemia.  Cardiac risk factors, hypertension, hyperlipidemia, diet-controlled DM.  Last visit with Dr. Harl Bowie on 04/20/2019.  She had been having dyspnea on exertion x1 month. Stated could occur with housework like sweeping, doing laundry, or walking.  Could have some lower extremity edema.  No orthopnea, on orthopnea or wheezing.  No chest pain.  Echocardiogram was ordered.  Consider Lexiscan secondary to multiple risk factors.  Lexiscan stress test 05/26/2019 was negative for ischemia and low risk.  Recent hospital admission on 07/22/2019 for complaints of several day history of chest pain.  In the ED patient was noted to be quite tachycardic with heart rate in the 120s to 150s.  She had an EKG that showed sinus tachycardia, left atrial enlargement, borderline T wave abnormalities in lateral leads.  Chest x-ray showed possible right opacity with bilateral pleural effusions.  CT scan ruled out PE.  Admitted for community-acquired pneumonia.  She was given ceftriaxone and Zithromax.  CT chest showed findings consistent with probable asbestos exposure.  Pulmonology was consulted.  She will be following with pulmonary.  She had atrial fibrillation with RVR on telemetry requiring multiple doses of metoprolol and Cardizem.  Cardiology was consulted.  Patient had developed PAF and was started on metoprolol 25 mg p.o. twice daily for rate control and Eliquis 5 mg p.o. twice daily given CHADS2 score of 4.  She was discharged on Omnicef.  Her aspirin was stopped due to starting Eliquis.  She presents today for hospital follow up. She states she  continues to feel a little sob since d/c from hospital. Otherwise she denies any anginal or exertional symptoms, palpitations of arrhythmias, orthostatic symptoms, PND, orthopnea. No LE edema.  Past Medical History:  Diagnosis Date  . Breast cancer ([place]) 1990s   right breast  . Collagen vascular disease ([place])   . Diabetes mellitus without complication ([place])   . GERD (gastroesophageal reflux disease)   . HTN (hypertension)   . Hypercholesterolemia   . Obesity   . Osteoarthritis     Past Surgical History:  Procedure Laterality Date  . ABDOMIN[place]  [place]  . COLONOSCOPY  May 2003   Dr. Tamala Julian: normal  . COLONOSCOPY  06/22/2011   Procedure: COLONOSCOPY;  Surgeon: Danie Binder, MD;  Location: [facility];  Service: Endoscopy;  Laterality: N/A;  10:30  . MASTECTOMY  1995   right     Current Outpatient Medications  Medication Sig Dispense Refill  . amLODipine (NORVASC) 5 MG tablet TAKE 1 TABLET BY MOUTH EVERY DAY 90 tablet 0  . apixaban (ELIQUIS) 5 MG TABS tablet Take 1 tablet (5 mg total) by mouth 2 (two) times daily. 60 tablet 2  . atorvastatin (LIPITOR) 10 MG tablet TAKE 1 TABLET BY MOUTH DAILY 90 tablet 3  . benazepril (LOTENSIN) 40 MG tablet TAKE 1 TABLET BY MOUTH EVERY DAY 90 tablet 1  . Blood Glucose Monitoring Suppl (ONETOUCH VERIO) w/Device KIT USE TO TEST BLOOD SUGAR ONCE DAILY AS DIRECTED 1 kit 0  . calcium-vitamin D (OSCAL WITH D) 500-200 MG-UNIT tablet Take 1 tablet by mouth daily with  breakfast.    . diclofenac sodium (VOLTAREN) 1 % GEL Apply 3 g to 3 large joints up to 3 times daily. 3 Tube 3  . famotidine (PEPCID) 20 MG tablet Take 20 mg by mouth 2 (two) times daily.    . fluticasone (FLONASE) 50 MCG/ACT nasal spray USE 2 SPRAYS IN EACH NOSTRIL EVERY DAY 48 g 0  . folic acid (FOLVITE) 568 MCG tablet Take 400 mcg by mouth daily.    Marland Kitchen glucose blood test strip USE TO TEST BLOOD SUGAR ONCE DAILY. E11.40 100 each 3  . hydroxychloroquine  (PLAQUENIL) 200 MG tablet TAKE 1 TABLET BY MOUTH  DAILY Monday through Friday 60 tablet 0  . loratadine (CLARITIN) 10 MG tablet TAKE 1 TABLET BY MOUTH EVERY DAY 30 tablet 2  . metoCLOPramide (REGLAN) 10 MG tablet Take 10 mg by mouth See admin instructions. TAKE 1 TABLET BY MOUTH AFTER MEALS AND AT 9:00pm AND 3:00am FOR SEVEN DAYS.    Marland Kitchen Multiple Vitamin (MULTIVITAMIN) tablet Take 1 tablet by mouth daily.    Marland Kitchen nystatin (MYCOSTATIN) 100000 UNIT/ML suspension Take 5 mLs (500,000 Units total) by mouth 4 (four) times daily. Use for 10 days 200 mL 0  . Omega-3 Fatty Acids (FISH OIL) 1000 MG CAPS Take 1 capsule by mouth daily.     Marland Kitchen omeprazole (PRILOSEC) 40 MG capsule TAKE ONE CAPSULE BY MOUTH DAILY 90 capsule 3  . ONETOUCH DELICA LANCETS 12X MISC USE TO TEST ONCE DAILY 100 each 2  . ONETOUCH VERIO test strip USE TO TEST BLOOD SUGAR ONCE DAILY 100 each 0  . triamcinolone ointment (KENALOG) 0.5 % Apply 1 application topically 2 (two) times daily. Do not use for more than 14 days in a row 15 g 0   No current facility-administered medications for this visit.   Allergies:  Levofloxacin, Nitrofurantoin, Penicillins, Sulfonamide derivatives, and Pantoprazole   Social History: The patient  reports that she has never smoked. She has never used smokeless tobacco. She reports that she does not drink alcohol and does not use drugs.   Family History: The patient's family history includes Diabetes in her brother, brother, father, mother, sister, and sister; Emphysema in her father; Heart failure in her mother; Hypercholesterolemia in her brother, brother, sister, sister, sister, and sister; Hypertension in her brother, brother, and sister.   ROS:  Please see the history of present illness. Otherwise, complete review of systems is positive for none.  All other systems are reviewed and negative.   Physical Exam: VS:  BP (!) 168/84   Pulse 84   Ht 5' 9"  (1.753 m)   Wt 194 lb (88 kg)   SpO2 98%   BMI 28.65 kg/m ,  BMI Body mass index is 28.65 kg/m.  Wt Readings from Last 3 Encounters:  08/11/19 194 lb (88 kg)  08/02/19 194 lb (88 kg)  07/22/19 192 lb 0.3 oz (87.1 kg)    General: Patient appears comfortable at rest. Neck: Supple, no elevated JVP or carotid bruits, no thyromegaly. Lungs: Clear to auscultation, nonlabored breathing at rest. Cardiac: Regular rate and rhythm, no S3 or significant systolic murmur, no pericardial rub. Extremities: No pitting edema, distal pulses 2+. Skin: Warm and dry. Musculoskeletal: No kyphosis. Neuropsychiatric: Alert and oriented x3, affect grossly appropriate.  ECG:    Recent Labwork: 07/22/2019: B Natriuretic Peptide 74.3 07/25/2019: ALT 39; AST 33; Magnesium 2.2 08/02/2019: BUN 13; Creat 1.14; Hemoglobin 11.7; Platelets 342; Potassium 4.2; Sodium 141     Component Value Date/Time  CHOL 142 06/21/2019 0924   TRIG 38.0 06/21/2019 0924   HDL 51.40 06/21/2019 0924   CHOLHDL 3 06/21/2019 0924   VLDL 7.6 06/21/2019 0924   LDLCALC 83 06/21/2019 0924    Other Studies Reviewed Today:  NST 05/26/2019 Study Result  Narrative & Impression   No diagnostic ST segment changes to indicate ischemia.  No significant myocardial perfusion defects to indicate scar or ischemia. There is apical breast attenuation artifact  This is a low risk study.  Nuclear stress EF: 72%.      Echocardiogram 05/11/2019 IMPRESSIONS  1. Left ventricular ejection fraction, by estimation, is 60 to 65%. The  left ventricle has normal function. The left ventricle has no regional  wall motion abnormalities. There is mild left ventricular hypertrophy.  Left ventricular diastolic parameters  were normal.  2. Right ventricular systolic function is normal. The right ventricular  size is normal. There is mildly elevated pulmonary artery systolic  pressure.  3. The mitral valve is normal in structure. Trivial mitral valve  regurgitation. No evidence of mitral stenosis.  4. The aortic  valve is tricuspid. Aortic valve regurgitation is not  visualized. No aortic stenosis is present.  5. The inferior vena cava is normal in size with greater than 50%  respiratory variability, suggesting right atrial pressure of 3 mmHg.   Comparison(s): Stress Echocardiogram done 11/03/15 showed an EF of 60%  Assessment and Plan:  1. DOE (dyspnea on exertion)    1. DOE (dyspnea on exertion) Recent hospitalization for CAP. CXR showed R upper opacity and bilateral pleural effusions. CT scan showed evidence of probable asbestos exposure. She continues to have mild SOB on exertion. She has a pulmonology consult and will be following with pulmonology soon.   2.  Paroxysmal atrial fibrillation. Patient was having episodes of SVT and PAF in hospital requiring multiple doses of Metoprolol and Cardizem to control. Currently denies any palpitations or arrhythmias. Continue Eliquis 5 mg po bid.  3.  Hypertension. BP elevated on arrival today. States blood pressure is normally much better. Continue Amlodipine 5 mg po daily, Benazepril 40 mg daily    Medication Adjustments/Labs and Tests Ordered: Current medicines are reviewed at length with the patient today.  Concerns regarding medicines are outlined above.   Disposition: Follow-up with Dr. Harl Bowie or APP at scheduled visit  Signed, Levell July, NP 08/12/2019 11:26 PM    [facility] [place], [place], [place] [address] Phone: [phone]; Fax: [phone]

## 2019-08-11 NOTE — Patient Instructions (Signed)
Medication Instructions:  Continue all current medications.  Labwork: none  Testing/Procedures: none  Follow-Up: Keep already scheduled appointment with Dr. Harl Bowie.    Any Other Special Instructions Will Be Listed Below (If Applicable).  If you need a refill on your cardiac medications before your next appointment, please call your pharmacy.

## 2019-08-12 ENCOUNTER — Encounter: Payer: Self-pay | Admitting: Family Medicine

## 2019-08-15 NOTE — Telephone Encounter (Signed)
Checked pt's chart and pt has not been seen at the office by any of our providers. Angela Simmons, please advise.

## 2019-08-17 ENCOUNTER — Telehealth: Payer: Self-pay

## 2019-08-17 NOTE — Telephone Encounter (Signed)
New message    The patient calling  her blood pressure is elevated 156/101   The patient is aware the call will be transferred over to [facility] triage for assessment.   Call transfer over to [facility]

## 2019-08-18 NOTE — Telephone Encounter (Signed)
Noted - agree with ED eval

## 2019-08-18 NOTE — Telephone Encounter (Signed)
[facility] CALL/REPORT : Celinda states that her blood pressure was 156/101 with shortness of breath while walking and headache (rated as an 8 on the 1 to 10 scale) this morning. No chest pain or numbness on one side of the body. No fever. Alert and responsive.  Patient advised GO TO ED NOW.  I do not see that patient went.

## 2019-08-21 NOTE — Telephone Encounter (Signed)
I don't see that this patent has ever been seen by our office. Please advise

## 2019-08-22 ENCOUNTER — Other Ambulatory Visit: Payer: Self-pay

## 2019-08-22 ENCOUNTER — Emergency Department (HOSPITAL_COMMUNITY): Payer: PPO

## 2019-08-22 ENCOUNTER — Telehealth: Payer: Self-pay

## 2019-08-22 ENCOUNTER — Encounter (HOSPITAL_COMMUNITY): Payer: Self-pay

## 2019-08-22 DIAGNOSIS — R002 Palpitations: Secondary | ICD-10-CM | POA: Diagnosis not present

## 2019-08-22 DIAGNOSIS — R531 Weakness: Secondary | ICD-10-CM | POA: Diagnosis not present

## 2019-08-22 DIAGNOSIS — Z79899 Other long term (current) drug therapy: Secondary | ICD-10-CM | POA: Insufficient documentation

## 2019-08-22 DIAGNOSIS — R42 Dizziness and giddiness: Secondary | ICD-10-CM | POA: Insufficient documentation

## 2019-08-22 DIAGNOSIS — R519 Headache, unspecified: Secondary | ICD-10-CM | POA: Diagnosis not present

## 2019-08-22 DIAGNOSIS — I480 Paroxysmal atrial fibrillation: Secondary | ICD-10-CM | POA: Insufficient documentation

## 2019-08-22 DIAGNOSIS — I10 Essential (primary) hypertension: Secondary | ICD-10-CM | POA: Insufficient documentation

## 2019-08-22 DIAGNOSIS — R0602 Shortness of breath: Secondary | ICD-10-CM | POA: Diagnosis not present

## 2019-08-22 DIAGNOSIS — E119 Type 2 diabetes mellitus without complications: Secondary | ICD-10-CM | POA: Insufficient documentation

## 2019-08-22 DIAGNOSIS — R279 Unspecified lack of coordination: Secondary | ICD-10-CM | POA: Diagnosis not present

## 2019-08-22 MED ORDER — SODIUM CHLORIDE 0.9% FLUSH: 3.0000 mL | Freq: Once | INTRAVENOUS | Status: DC

## 2019-08-22 NOTE — ED Notes (Signed)
Attempted to obtain bloodwork unsuccessfully.

## 2019-08-22 NOTE — ED Triage Notes (Signed)
Per EMS- Patient is from home. Patient c/o weakness x 5 days, dizziness, heart palpitations, and SOB x 2 days. No edema, lungs clear. Patient was given 500 ml NS per EMS.

## 2019-08-22 NOTE — Telephone Encounter (Unsigned)
New message   Per patient   1. What are your BP readings?  Today 151/85  Yesterday 130/76   2. What is your BP issue? Suggestion from Dr.Burns on what she should do

## 2019-08-23 ENCOUNTER — Emergency Department (HOSPITAL_COMMUNITY)
Admission: EM | Admit: 2019-08-23 | Discharge: 2019-08-23 | Disposition: A | Discharge status: Home / Self Care | Payer: PPO | Attending: Emergency Medicine | Admitting: Emergency Medicine

## 2019-08-23 ENCOUNTER — Emergency Department (HOSPITAL_COMMUNITY): Payer: PPO

## 2019-08-23 DIAGNOSIS — R531 Weakness: Secondary | ICD-10-CM | POA: Diagnosis not present

## 2019-08-23 DIAGNOSIS — R519 Headache, unspecified: Secondary | ICD-10-CM | POA: Diagnosis not present

## 2019-08-23 DIAGNOSIS — I480 Paroxysmal atrial fibrillation: Secondary | ICD-10-CM

## 2019-08-23 DIAGNOSIS — R42 Dizziness and giddiness: Secondary | ICD-10-CM

## 2019-08-23 DIAGNOSIS — R0602 Shortness of breath: Secondary | ICD-10-CM | POA: Diagnosis not present

## 2019-08-23 LAB — CBC
HCT: 39 % (ref 36.0–46.0)
Hemoglobin: 12.2 g/dL (ref 12.0–15.0)
MCH: 29.5 pg (ref 26.0–34.0)
MCHC: 31.3 g/dL (ref 30.0–36.0)
MCV: 94.2 fL (ref 80.0–100.0)
Platelets: 238 10*3/uL (ref 150–400)
RBC: 4.14 MIL/uL (ref 3.87–5.11)
RDW: 13.8 % (ref 11.5–15.5)
WBC: 3.7 10*3/uL — ABNORMAL LOW (ref 4.0–10.5)
nRBC: 0 % (ref 0.0–0.2)

## 2019-08-23 LAB — URINALYSIS, ROUTINE W REFLEX MICROSCOPIC
Bilirubin Urine: NEGATIVE
Glucose, UA: NEGATIVE mg/dL
Hgb urine dipstick: NEGATIVE
Ketones, ur: NEGATIVE mg/dL
Leukocytes,Ua: NEGATIVE
Nitrite: NEGATIVE
Protein, ur: NEGATIVE mg/dL
Specific Gravity, Urine: 1.008 (ref 1.005–1.030)
pH: 7 (ref 5.0–8.0)

## 2019-08-23 LAB — TROPONIN I (HIGH SENSITIVITY): Troponin I (High Sensitivity): 3 ng/L (ref <18)

## 2019-08-23 LAB — BASIC METABOLIC PANEL
Anion gap: 10 (ref 5–15)
BUN: 7 mg/dL — ABNORMAL LOW (ref 8–23)
CO2: 26 mmol/L (ref 22–32)
Calcium: 9.2 mg/dL (ref 8.9–10.3)
Chloride: 103 mmol/L (ref 98–111)
Creatinine, Ser: 0.82 mg/dL (ref 0.44–1.00)
GFR calc Af Amer: >60 mL/min (ref >60)
GFR calc non Af Amer: >60 mL/min (ref >60)
Glucose, Bld: 112 mg/dL — ABNORMAL HIGH (ref 70–99)
Potassium: 3.8 mmol/L (ref 3.5–5.1)
Sodium: 139 mmol/L (ref 135–145)

## 2019-08-23 LAB — GLUCOSE, CAPILLARY: Glucose-Capillary: 107 mg/dL — ABNORMAL HIGH (ref 70–99)

## 2019-08-23 MED ORDER — ACETAMINOPHEN 500 MG PO TABS
1000.0000 mg | ORAL_TABLET | Freq: Once | ORAL | Status: AC
  Administered 2019-08-23: 1000 mg via ORAL
  Filled 2019-08-23: qty 2

## 2019-08-23 MED ORDER — CARVEDILOL 3.125 MG PO TABS: 6.2500 mg | ORAL_TABLET | Freq: Two times a day (BID) | ORAL | Status: DC

## 2019-08-23 MED ORDER — APIXABAN 5 MG PO TABS
5.0000 mg | ORAL_TABLET | Freq: Once | ORAL | Status: DC
  Filled 2019-08-23: qty 1

## 2019-08-23 MED ORDER — CARVEDILOL 6.25 MG PO TABS
6.2500 mg | ORAL_TABLET | Freq: Two times a day (BID) | ORAL | 0 refills | Status: DC
Start: 2019-08-23 — End: 2019-09-04

## 2019-08-23 MED ORDER — AMLODIPINE BESYLATE 5 MG PO TABS
5.0000 mg | ORAL_TABLET | Freq: Once | ORAL | Status: AC
  Administered 2019-08-23: 5 mg via ORAL
  Filled 2019-08-23: qty 1

## 2019-08-23 NOTE — ED Provider Notes (Signed)
[facility] DEPT Provider Note   CSN: [id number] Arrival date & time: 08/22/19  1623     History Chief Complaint  Patient presents with  . Weakness  . Dizziness  . Shortness of Breath    Angela Simmons is a 74 y.o. female.  HPI Patient reports an episode while sitting in her kitchen table yesterday around 3 PM.  She suddenly felt like her heart was racing and beating hard in her chest.  At that time she felt very short of breath.  Symptoms lasted just for a few seconds.  She reports after that she had a frontal headache that was throbbing in nature.  No loss of consciousness.  No visual changes.  No nausea no vomiting.  Patient was transported by EMS.  She was given 500 mL normal saline.  Patient reports for several days she has felt generally slightly weak.  No focal weakness numbness or tingling of extremities.  She reports sometimes she feels dizzy, "like something is moving in her head".  She also notes some feeling of pressure or fullness in her ears and sinuses.  Patient reports she is compliant with her medications.  She reports sometimes however morning blood pressures are up in the 140s over 80s or 90s.    Past Medical History:  Diagnosis Date  . Breast cancer ([place]) 1990s   right breast  . Collagen vascular disease ([place])   . Diabetes mellitus without complication ([place])   . GERD (gastroesophageal reflux disease)   . HTN (hypertension)   . Hypercholesterolemia   . Obesity   . Osteoarthritis     Patient Active Problem List   Diagnosis Date Noted  . A-fib ([place]) 08/04/2019  . Thrush 08/02/2019  . Anemia 08/02/2019  . Community acquired pneumonia 07/22/2019  . Burning sensation 06/21/2019  . DOE (dyspnea on exertion) 04/03/2019  . Ankle swelling, left 04/03/2019  . Hyperpigmented skin lesion 04/03/2019  . Poor balance 12/06/2018  . Headache 12/06/2018  . Vertigo 06/07/2018  . Urinary frequency 06/07/2018  . Lower abdominal pain  06/07/2018  . Herpes zoster without complication 20/94/7096  . Tingling in extremities 11/26/2017  . Ischial bursitis of left side 06/02/2017  . Sicca syndrome ([place]) 06/23/2016  . Bilateral leg edema 04/07/2016  . Burning sensation of skin 04/07/2016  . PMR (polymyalgia rheumatica) (HCC) 03/27/2016  . Osteopenia 03/04/2016  . Autoimmune disease ([place]) 03/18/2015  . Inflammatory arthritis 03/18/2015  . Joint pain 11/22/2014  . Overweight (BMI 25.0-29.9) 08/18/2014  . Low back pain with radiation 08/18/2014  . Hyperlipidemia 06/28/2014  . Chest pain 04/11/2014  . Tendonitis of shoulder 08/29/2013  . S/P right mastectomy 11/14/2012  . Type 2 diabetes, controlled, with neuropathy ([place]) 10/05/2012  . Allergic rhinitis 01/26/2011  . GERD 11/10/2009  . INTERSTITIAL CYSTITIS 10/31/2008  . BENIGN NEOPLASM OF VULVA 02/01/2008  . Essential hypertension, benign 02/08/2007  . History of cancer of right breast 02/08/2007    Past Surgical History:  Procedure Laterality Date  . ABDOMINAL [place]CT[place]Y  [place]   [place]  . COLONOSCOPY  May 2003   Dr. Tamala Julian: normal  . COLONOSCOPY  06/22/2011   Procedure: COLONOSCOPY;  Surgeon: Danie Binder, MD;  Location: [facility];  Service: Endoscopy;  Laterality: N/A;  10:30  . MASTECTOMY  1995   right      OB History   No obstetric history on file.     Family History  Problem Relation Age of Onset  .  Diabetes Mother        diabetic coma  . Heart failure Mother   . Diabetes Father   . Emphysema Father   . Diabetes Sister   . Hypercholesterolemia Sister   . Hypercholesterolemia Brother   . Diabetes Brother   . Hypertension Brother   . Hypercholesterolemia Brother   . Diabetes Sister   . Hypertension Sister   . Hypercholesterolemia Sister   . Hypercholesterolemia Sister   . Hypercholesterolemia Sister   . Diabetes Brother   . Hypertension Brother   . Colon cancer Neg Hx     Social History   Tobacco Use  . Smoking status:  Never Smoker  . Smokeless tobacco: Never Used  Vaping Use  . Vaping Use: Never used  Substance Use Topics  . Alcohol use: No  . Drug use: Never    Home Medications Prior to Admission medications   Medication Sig Start Date End Date Taking? Authorizing Provider  amLODipine (NORVASC) 5 MG tablet TAKE 1 TABLET BY MOUTH EVERY DAY 07/13/19   Binnie Rail, MD  apixaban (ELIQUIS) 5 MG TABS tablet Take 1 tablet (5 mg total) by mouth 2 (two) times daily. 07/25/19   Shawna Clamp, MD  atorvastatin (LIPITOR) 10 MG tablet TAKE 1 TABLET BY MOUTH DAILY 11/07/18   Binnie Rail, MD  benazepril (LOTENSIN) 40 MG tablet TAKE 1 TABLET BY MOUTH EVERY DAY 03/16/19   Burns, Claudina Lick, MD  Blood Glucose Monitoring Suppl (ONETOUCH VERIO) w/Device KIT USE TO TEST BLOOD SUGAR ONCE DAILY AS DIRECTED 04/05/17   Burns, Claudina Lick, MD  calcium-vitamin D (OSCAL WITH D) 500-200 MG-UNIT tablet Take 1 tablet by mouth daily with breakfast.    [provider]  diclofenac sodium (VOLTAREN) 1 % GEL Apply 3 g to 3 large joints up to 3 times daily. 07/20/17   Ofilia Neas, PA-C  famotidine (PEPCID) 20 MG tablet Take 20 mg by mouth 2 (two) times daily. 07/21/19   [provider]  fluticasone Asencion Islam) 50 MCG/ACT nasal spray USE 2 SPRAYS IN EACH NOSTRIL EVERY DAY 06/12/14   Fayrene Helper, MD  folic acid (FOLVITE) 741 MCG tablet Take 400 mcg by mouth daily.    [provider]  glucose blood test strip USE TO TEST BLOOD SUGAR ONCE DAILY. E11.40 07/06/18   Binnie Rail, MD  hydroxychloroquine (PLAQUENIL) 200 MG tablet TAKE 1 TABLET BY MOUTH  DAILY Monday through Friday 07/06/19   Bo Merino, MD  loratadine (CLARITIN) 10 MG tablet TAKE 1 TABLET BY MOUTH EVERY DAY 03/25/15   Fayrene Helper, MD  metoCLOPramide (REGLAN) 10 MG tablet Take 10 mg by mouth See admin instructions. TAKE 1 TABLET BY MOUTH AFTER MEALS AND AT 9:00pm AND 3:00am FOR SEVEN DAYS. 07/21/19   [provider]  Multiple Vitamin  (MULTIVITAMIN) tablet Take 1 tablet by mouth daily.    [provider]  nystatin (MYCOSTATIN) 100000 UNIT/ML suspension Take 5 mLs (500,000 Units total) by mouth 4 (four) times daily. Use for 10 days 08/02/19   Binnie Rail, MD  Omega-3 Fatty Acids (FISH OIL) 1000 MG CAPS Take 1 capsule by mouth daily.     [provider]  omeprazole (PRILOSEC) 40 MG capsule TAKE ONE CAPSULE BY MOUTH DAILY 07/03/19   Binnie Rail, MD  [facility] DELICA LANCETS 63A MISC USE TO TEST ONCE DAILY 03/03/18   Binnie Rail, MD  [facility] VERIO test strip USE TO TEST BLOOD SUGAR ONCE DAILY 07/06/18  Binnie Rail, MD  triamcinolone ointment (KENALOG) 0.5 % Apply 1 application topically 2 (two) times daily. Do not use for more than 14 days in a row 04/23/17   Burns, Claudina Lick, MD    Allergies    Levofloxacin, Nitrofurantoin, Penicillins, Sulfonamide derivatives, and Pantoprazole  Review of Systems   Review of Systems 10 systems reviewed and negative except as per HPI Physical Exam Updated Vital Signs BP (!) 142/78   Pulse 72   Temp (!) 97.4 F (36.3 C) (Oral)   Resp 17   Ht 5' 9" (1.753 m)   Wt 88 kg   SpO2 99%   BMI 28.65 kg/m   Physical Exam Constitutional:      Appearance: She is well-developed.  HENT:     Head: Normocephalic and atraumatic.     Right Ear: Tympanic membrane normal.     Left Ear: Tympanic membrane normal.     Mouth/Throat:     Mouth: Mucous membranes are moist.     Pharynx: Oropharynx is clear.  Eyes:     Pupils: Pupils are equal, round, and reactive to light.  Cardiovascular:     Rate and Rhythm: Normal rate and regular rhythm.     Heart sounds: Normal heart sounds.  Pulmonary:     Effort: Pulmonary effort is normal.     Breath sounds: Normal breath sounds.  Abdominal:     General: Bowel sounds are normal. There is no distension.     Palpations: Abdomen is soft.     Tenderness: There is no abdominal tenderness.  Musculoskeletal:        General: Normal range  of motion.     Cervical back: Neck supple.  Skin:    General: Skin is warm and dry.  Neurological:     General: No focal deficit present.     Mental Status: She is alert and oriented to person, place, and time.     GCS: GCS eye subscore is 4. GCS verbal subscore is 5. GCS motor subscore is 6.     Cranial Nerves: No cranial nerve deficit.     Sensory: No sensory deficit.     Motor: No weakness.     Coordination: Coordination normal.     ED Results / Procedures / Treatments   Labs (all labs ordered are listed, but only abnormal results are displayed) Labs Reviewed  BASIC METABOLIC PANEL - Abnormal; Notable for the following components:      Result Value   Glucose, Bld 112 (*)    BUN 7 (*)    All other components within normal limits  CBC - Abnormal; Notable for the following components:   WBC 3.7 (*)    All other components within normal limits  URINALYSIS, ROUTINE W REFLEX MICROSCOPIC - Abnormal; Notable for the following components:   Color, Urine STRAW (*)    All other components within normal limits  GLUCOSE, CAPILLARY - Abnormal; Notable for the following components:   Glucose-Capillary 107 (*)    All other components within normal limits  CBG MONITORING, ED  TROPONIN I (HIGH SENSITIVITY)  TROPONIN I (HIGH SENSITIVITY)    EKG EKG Interpretation  Date/Time:  Tuesday August 22 2019 16:50:40 EDT Ventricular Rate:  88 PR Interval:    QRS Duration: 68 QT Interval:  447 QTC Calculation: 541 R Axis:   11 Text Interpretation: Sinus rhythm Prominent P waves, nondiagnostic Abnormal R-wave progression, early transition Nonspecific T abnormalities, diffuse leads Prolonged QT interval 12 Lead; [place] Since  last tracing rate slower Otherwise no significant change Confirmed by Deno Etienne [phone]) on 08/23/2019 4:10:53 AM   Radiology DG Chest 2 View  Result Date: 08/22/2019 CLINICAL DATA:  Palpitations EXAM: CHEST - 2 VIEW COMPARISON:  None. FINDINGS: The heart size and  mediastinal contours are within normal limits. Both lungs are clear. No pleural effusion. The visualized skeletal structures are unremarkable. IMPRESSION: No acute process in the chest. Electronically Signed   By: Macy Mis M.D.   On: 08/22/2019 18:18   CT Head Wo Contrast  Result Date: 08/23/2019 CLINICAL DATA:  Headache, new or worsening.  Weakness for 5 days EXAM: CT HEAD WITHOUT CONTRAST TECHNIQUE: Contiguous axial images were obtained from the base of the skull through the vertex without intravenous contrast. COMPARISON:  None. FINDINGS: Brain: No evidence of acute infarction, hemorrhage, hydrocephalus, extra-axial collection or mass lesion/mass effect. Vascular: No hyperdense vessel or unexpected calcification. Skull: Normal. Negative for fracture or focal lesion. Sinuses/Orbits: No acute finding. IMPRESSION: Negative head CT. Electronically Signed   By: Monte Fantasia M.D.   On: 08/23/2019 09:52    Procedures Procedures (including critical care time)  Medications Ordered in ED Medications  sodium chloride flush (NS) 0.9 % injection 3 mL (has no administration in time range)  carvedilol (COREG) tablet 6.25 mg (has no administration in time range)  apixaban (ELIQUIS) tablet 5 mg (has no administration in time range)  acetaminophen (TYLENOL) tablet 1,000 mg (1,000 mg Oral Given 08/23/19 0920)  amLODipine (NORVASC) tablet 5 mg (5 mg Oral Given 08/23/19 4827)    ED Course  I have reviewed the triage vital signs and the nursing notes.  Pertinent labs & imaging results that were available during my care of the patient were reviewed by me and considered in my medical decision making (see chart for details).    MDM Rules/Calculators/A&P                         Patient had an episode yesterday afternoon of palpitations with shortness of breath.  Patient has known history of paroxysmal atrial fibrillation.  She is on Eliquis routinely since her hospitalization in early July when this  condition was identified.  After the palpitations resolved, patient denies she had any ongoing chest pain or shortness of breath.  She has had a persistent frontal headache.  No visual changes.  No focal neurologic deficits.  Due to being on Eliquis, CT head was obtained without any acute findings.  Patient's neurologic exam is normal.  She describes some symptoms that are vertiginous in quality with extra pressure in her ears and movement sensation.  Patient however does not have any activity limiting symptoms of vertigo.  This time will add low-dose Coreg for rate control in the event patient is getting some breakthrough episodes of atrial flutter or fibrillation.  Patient is already taking Eliquis routinely.  Patient's blood pressures are not hypotensive.  At this time we will have her continue her baseline amlodipine and Benzapril.  Patient has close follow-up with PCP tomorrow and can schedule recheck with cardiology.  Return precautions reviewed. Final Clinical Impression(s) / ED Diagnoses Final diagnoses:  Paroxysmal atrial fibrillation (HCC)  Shortness of breath  Dizziness  Frontal headache    Rx / DC Orders ED Discharge Orders    None       Charlesetta Shanks, MD 08/23/19 1008

## 2019-08-23 NOTE — Discharge Instructions (Signed)
1.  Continue all of your regularly prescribed medications. 2.  Start taking carvedilol twice a day as prescribed.  Monitor your blood pressures 3 times a day.  Monitor your heart rate as well.  Keep a journal.  If your blood pressure is going below 110/60, or your heart rate is less than 60, do not take your carvedilol dose.  Continue to document your heart rate and blood pressure.  Reviewed these results with your doctor to determine your ongoing prescriptions for your blood pressure and heart rate medications. 3.  Return to the emergency department if you have any recurrence of concerning symptoms.

## 2019-08-23 NOTE — Telephone Encounter (Signed)
There is a recall in the pt's chart for her to be scheduled with [place] in October. Will route to front desk pool to handle.

## 2019-08-24 ENCOUNTER — Ambulatory Visit: Payer: PPO | Admitting: Internal Medicine

## 2019-08-24 NOTE — Telephone Encounter (Signed)
Since recall is placed we will call when October schedules available -pr

## 2019-08-26 ENCOUNTER — Other Ambulatory Visit: Payer: Self-pay | Admitting: Rheumatology

## 2019-08-30 ENCOUNTER — Telehealth: Payer: Self-pay | Admitting: Pharmacist

## 2019-08-30 ENCOUNTER — Telehealth: Payer: Self-pay | Admitting: Internal Medicine

## 2019-08-30 NOTE — Telephone Encounter (Signed)
    Patient calling to clarify dosage of  amLODipine (NORVASC).  She is under the impression she should be taking 10mg   Please clarify

## 2019-08-30 NOTE — Progress Notes (Signed)
° ° °  Chronic Care Management °Pharmacy Assistant  ° °A user error has taken place: encounter opened in error, closed for administrative reasons. ° ° ° ° ° ° ° °

## 2019-08-30 NOTE — Telephone Encounter (Signed)
Spoke with patient today. She has a follow up appointment to see Dr. Quay Burow on next Thursday.

## 2019-08-31 ENCOUNTER — Ambulatory Visit: Payer: PPO | Admitting: Pharmacist

## 2019-08-31 ENCOUNTER — Other Ambulatory Visit: Payer: Self-pay

## 2019-08-31 DIAGNOSIS — E7849 Other hyperlipidemia: Secondary | ICD-10-CM

## 2019-08-31 DIAGNOSIS — E1140 Type 2 diabetes mellitus with diabetic neuropathy, unspecified: Secondary | ICD-10-CM

## 2019-08-31 DIAGNOSIS — I4891 Unspecified atrial fibrillation: Secondary | ICD-10-CM

## 2019-08-31 DIAGNOSIS — I10 Essential (primary) hypertension: Secondary | ICD-10-CM

## 2019-08-31 NOTE — Chronic Care Management (AMB) (Signed)
Chronic Care Management Pharmacy  Name: Angela Simmons  MRN: [id number] DOB: Jun 15, 1945   Chief Complaint/ HPI  Angela Simmons,  74 y.o. , female presents for their Initial CCM visit with the clinical pharmacist via telephone due to COVID-19 Pandemic.  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 Goes to church, goes shopping, goes out with friends. Lives with husband and dog.   Office Visits: 08/02/19 Dr Quay Burow OV: hospital f/u. New Afib, continue metoprolol and Eliquis. Rx'd nystatin for thrush.  06/21/19 Dr Quay Burow OV: chronic f/u, burning sensation in groin, may consider gabapentin. UA negative, labs stable. No med changes.  Consult Visit: 08/23/19 ED visit: Afib - weakness, dizziness, SOB. EKG NSR, CXR normal, CT head normal. Added carvedilol 6.25 mg for rate control.  08/11/19 NP Levell July (cardiology): hospital f/u, stopped metoprolol (pt not taking), continue Eliquis and BP meds.  07/22/19 Hospital admission: chest pain, tachycardia, admitted for CAP. CT chest possible asbestos exposure, referred to pulm. Afib w/ RVR requiring multiple doses of metoprolol and diltiazem. Started metoprolol 25 mg BID and Eliquis d/t CHADS2 of 4. Stopped aspirin d/t Eliquis.  07/20/19 ED visit [facility]): chest pain - determined to be GERD, ACS workup negative. Improved with antacids.  06/21/19 PA Hazel Sams (rheumatology): PMR f/u, condition stable.  Rec'd exercises for bursitis, may refer to PT or cortisone injection if no improvement.  05/26/19 normal stress test, no clear cause of symptoms.  05/01/19 Dr Estanislado Pandy (rheumatology): f/u PMR. WBC low, reduce Plaquenil to 1 tablet M-F.  Allergies  Allergen Reactions  . Levofloxacin Hives and Itching  . Nitrofurantoin Hives and Itching  . Penicillins Hives and Itching  . Sulfonamide Derivatives Itching  . Pantoprazole Other (See Comments)    Bad dreams    Medications: Outpatient Encounter Medications as of 08/31/2019  Medication Sig  . amLODipine (NORVASC) 5 MG tablet TAKE 1 TABLET BY MOUTH EVERY DAY  . apixaban (ELIQUIS) 5 MG TABS tablet Take 1 tablet (5 mg total) by mouth 2 (two) times daily.  Marland Kitchen atorvastatin (LIPITOR) 10 MG tablet TAKE 1 TABLET BY MOUTH DAILY  . benazepril (LOTENSIN) 40 MG tablet TAKE 1 TABLET BY MOUTH EVERY DAY  . Blood Glucose Monitoring Suppl (ONETOUCH VERIO) w/Device KIT USE TO TEST BLOOD SUGAR ONCE DAILY AS DIRECTED  . calcium-vitamin D (OSCAL WITH D) 500-200 MG-UNIT tablet Take 1 tablet by mouth daily with breakfast.  . carvedilol (COREG) 6.25 MG tablet Take 1 tablet (6.25 mg total) by mouth 2 (two) times daily with a meal.  . diclofenac sodium (VOLTAREN) 1 % GEL Apply 3 g to 3 large joints up to 3 times daily.  . fluticasone (FLONASE) 50 MCG/ACT nasal spray USE 2 SPRAYS IN EACH NOSTRIL EVERY DAY  . folic acid (FOLVITE) 062 MCG tablet Take 400 mcg by mouth daily.  Marland Kitchen glucose blood test strip USE TO TEST BLOOD SUGAR ONCE DAILY. E11.40  . hydroxychloroquine (PLAQUENIL) 200 MG tablet TAKE 1 TABLET BY MOUTH ONCE DAILY ON MONDAY-FRIDAY  . loratadine (CLARITIN)  10 MG tablet TAKE 1 TABLET BY MOUTH EVERY DAY  . metoCLOPramide (REGLAN) 10 MG tablet Take 10 mg by mouth See admin instructions. TAKE 1 TABLET BY MOUTH AFTER MEALS AND AT 9:00pm AND 3:00am FOR SEVEN DAYS.  Marland Kitchen Multiple Vitamin (MULTIVITAMIN) tablet Take 1 tablet by mouth daily.  Marland Kitchen nystatin (MYCOSTATIN) 100000  UNIT/ML suspension Take 5 mLs (500,000 Units total) by mouth 4 (four) times daily. Use for 10 days  . Omega-3 Fatty Acids (FISH OIL) 1000 MG CAPS Take 1 capsule by mouth daily.   Marland Kitchen omeprazole (PRILOSEC) 40 MG capsule TAKE ONE CAPSULE BY MOUTH DAILY  . ONETOUCH DELICA LANCETS 78I MISC USE TO TEST ONCE DAILY  . ONETOUCH VERIO test strip USE TO TEST BLOOD SUGAR ONCE DAILY  . triamcinolone ointment (KENALOG) 0.5 % Apply 1 application topically 2 (two) times daily. Do not use for more than 14 days in a row  . famotidine (PEPCID) 20 MG tablet Take 20 mg by mouth 2 (two) times daily. (Patient not taking: Reported on 08/31/2019)   No facility-administered encounter medications on file as of 08/31/2019.     Current Diagnosis/Assessment:  SDOH Interventions     Most Recent Value  SDOH Interventions  Financial Strain Interventions Intervention Not Indicated      Goals Addressed            This Visit's Progress   . Pharmacy Care Plan       CARE PLAN ENTRY (see longitudinal plan of care for additional care plan information)  Current Barriers:  . Chronic Disease Management support, education, and care coordination needs related to Hypertension, Hyperlipidemia, Diabetes, Atrial Fibrillation, and Polymyalgia rheumatica   Hypertension BP Readings from Last 3 Encounters:  08/23/19 137/79  08/11/19 (!) 168/84  08/02/19 138/80 .  Pharmacist Clinical Goal(s): o Over the next 90 days, patient will work with PharmD and providers to maintain BP goal <140/90 . Current regimen:  o Amlodipine 5 mg daily o Benazepril 40 mg daily o Carvedilol 6.25 mg twice a day . Interventions: o Discussed BP goals and benefits of medication for prevention of heart attack / stroke . Patient self care activities - Over the next 90 days, patient will: o Check BP daily, document, and provide at future appointments o Ensure daily salt intake < 2300 mg/day  Hyperlipidemia Lab Results  Component Value Date/Time    LDLCALC 83 06/21/2019 09:24 AM .  Pharmacist Clinical Goal(s): o Over the next 90 days, patient will work with PharmD and providers to maintain LDL goal < 100 . Current regimen:  o Atorvastatin 10 mg daily o OTC Fish oil 1000 mg daily . Interventions: o Discussed cholesterol goals and benefits of medication for prevention of heart attack / stroke . Patient self care activities - Over the next 90 days, patient will: o Continue current medications o Continue low choelsterol diet  Diabetes Lab Results  Component Value Date/Time   HGBA1C 6.0 06/21/2019 09:24 AM   HGBA1C 6.1 12/06/2018 12:03 PM .  Pharmacist Clinical Goal(s): o Over the next 90 days, patient will work with PharmD and providers to maintain A1c goal <7% . Current regimen:  o No medications . Interventions: o Discussed A1c, blood sugar goals and benefits of medication for prevention of diabetic complications . Patient self care activities - Over the next 90 days, patient will: o Check blood sugar in the morning before eating or drinking, document, and provide at future appointments o Contact provider with any episodes of hypoglycemia  Atrial Fibrilliation . Pharmacist Clinical Goal(s) o Over the next 90 days, patient will work with PharmD and providers to optimize therapy . Current regimen:  o Carvedilol 6.25 mg BID o Eliquis 5 mg BID . Interventions: o Discussed how Afib can increase risk for stroke o Discussed how carvedilol helps prevent Afib, and Eliquis helps prevent strokes . Patient self care activities - Over the next 90 days, patient will: o Continue medications as prescribed o Monitor for signs/symptoms of bleeding  Polymyalgia Rheumatica . Pharmacist Clinical Goal(s) o Over the next 90 days, patient will work with PharmD and providers to optimize therapy . Current regimen:  o Hydroxychloroquine 200 mg daily M-F o Diclofenac 1% gel 3 times daily as needed o Folic acid 130 mcg  daily . Interventions: o Discussed benefits of folic acid . Patient self care activities - Over the next 90 days, patient will: o Refill folic acid  Medication management . Pharmacist Clinical Goal(s): o Over the next 90 days, patient will work with PharmD and providers to maintain optimal medication adherence . Current pharmacy: [facility] . Interventions o Comprehensive medication review performed. o Continue current medication management strategy . Patient self care activities - Over the next 90 days, patient will: o Focus on medication adherence by pill box o Take medications as prescribed o Report any questions or concerns to PharmD and/or provider(s)  Initial goal documentation       AFIB   New dx July 2021.  Patient is currently rate controlled. Office heart rates are  Pulse Readings from Last 3 Encounters:  08/23/19 65  08/11/19 84  08/02/19 (!) 55   CHA2DS2-VASc Score = 4  The patient's score is based upon: CHF History: 0 HTN History: 1 Age : 1 Diabetes History: 1 Stroke History: 0 Vascular Disease History: 0 Gender: 1  Patient has failed these meds in past: n/a Patient is currently controlled on the following medications:  . Carvedilol 6.25 mg BID . Eliquis 5 mg BID  We discussed: what Afib is, increased risk of stroke with Afib, how medications work to reduce risks; bleeding risk with Eliquis; pt denies signs/sx of bleeding  Plan  Continue current medications  Hypertension   BP goal is:  <140/90  Office blood pressures are  BP Readings from Last 3 Encounters:  08/23/19 137/79  08/11/19 (!) 168/84  08/02/19 138/80   Patient checks BP at home daily Patient home BP readings are ranging:  8/6:160/100, 156/101, 132/69 (in pain that day) 8/7: 144/87 8/8: 131/78 8/9: 138/92 8/10: 144/92 8/11: 133/96 8/12: 136/84 HR 65-80  Patient has failed these meds in the past: n/a Patient is currently controlled on the following medications:   . Amlodipine 5 mg daily AM . Benazepril 40 mg daily . Carvedilol 6.25 mg BID  We discussed BP goals; benefits of medications; pt denies side effects  Plan  Continue current medications   Hyperlipidemia   LDL goal < 100  Lipid Panel     Component Value Date/Time   CHOL 142 06/21/2019 0924   TRIG 38.0 06/21/2019 0924   HDL 51.40 06/21/2019 0924   LDLCALC 83 06/21/2019 0924    Hepatic Function Latest Ref Rng & Units 07/25/2019 06/21/2019 05/01/2019  Total Protein 6.5 - 8.1 g/dL 6.7 7.8 7.0  Albumin 3.5 - 5.0 g/dL 3.0(L) 4.2 -  AST 15 - 41 U/L 33 19 21  ALT 0 - 44 U/L 39 19 22  Alk Phosphatase 38 - 126 U/L 53 73 -  Total Bilirubin 0.3 - 1.2 mg/dL 0.6 0.5 0.5  Bilirubin, Direct 0.0 - 0.3 mg/dL - - -    The 10-year ASCVD risk score Mikey Bussing DC Jr., et al., 2013) is: 24.9%   Values used to calculate the score:     Age: 25 years     Sex: Female     Is Non-Hispanic African American: Yes     Diabetic: Yes     Tobacco smoker: No     Systolic Blood Pressure: 322 mmHg     Is BP treated: Yes     HDL Cholesterol: 51.4 mg/dL     Total Cholesterol: 142 mg/dL   Patient has failed these meds in past: n/a Patient is currently controlled on the following medications:  . Atorvastatin 10 mg daily HS . OTC Fish oil 1000 mg daily  We discussed:  Cholesterol goals; benefits of statin for ASCVD risk reduction; pt denies side effects  Plan  Continue current medications  Diabetes   A1c goal <7%  Recent Relevant Labs: Lab Results  Component Value Date/Time   HGBA1C 6.0 06/21/2019 09:24 AM   HGBA1C 6.1 12/06/2018 12:03 PM   GFR 75.21 06/21/2019 09:24 AM   GFR 77.33 12/06/2018 12:03 PM   MICROALBUR 1.1 02/26/2016 11:02 AM   MICROALBUR 0.5 11/23/2013 03:51 PM    Last diabetic Eye exam:  Lab Results  Component Value Date/Time   HMDIABEYEEXA No Retinopathy 01/09/2019 12:00 AM    Last diabetic Foot exam: No results found for: HMDIABFOOTEX   Checking BG: Daily Recent FBG Readings:  90, 92 (always <100)  Patient has failed these meds in past: n/a Patient is currently controlled on the following medications: . No medications  We discussed: diet and exercise extensively  Plan  Continue control with diet and exercise  Polymyalgia Rheumatica   Patient has failed these meds in past: n/a Patient is currently controlled on the following medications:  . Hydroxychloroquine 200 mg daily M-F . Diclofenac 1% gel TID . Folic acid 025 mcg daily  We discussed:  Pt has been on immunosuppressants for a few years now, denies issues. Pt reports she is not taking folic acid right now due to running out; encouraged her to renew her supply and start taking again  Plan  Continue current medications  GERD   Patient has failed these meds in past:  Patient is currently controlled on the following medications:  . Omeprazole 40 mg daily . Famotidine 20 mg daily - not taking  We discussed:  Pt reports she was taken off of famotidine at some point recently; she denies issues with reflux/heartburn currently; discussed famotidine can be used as needed for breakthrough symptoms  Plan  Continue current medications  Osteopenia    Last DEXA Scan: 03/17/2016  T-Score femoral neck: -1.1  T-Score total hip: n/a  T-Score lumbar spine: +1.2  T-Score forearm radius: n/a  10-year probability of major osteoporotic fracture: 5.1%  10-year probability of hip fracture: 0.6%  Vit D, 25-Hydroxy  Date Value Ref Range Status  06/10/2009 35 30 - 89 ng/mL Final    Comment:    See lab report for associated comment(s)    Patient is not a candidate for pharmacologic treatment  Patient has failed these meds in past: n/a Patient is currently controlled on the following medications:  . Calcium-Vitamin D 500-200 mg-IU daily  We discussed:  Recommend [id number] units of vitamin D daily. Recommend 1200 mg of calcium daily from dietary and supplemental sources.  Plan  Continue current  medications  Allergic rhinitis   Patient has failed these meds in past: n/a Patient is currently controlled on the following medications:  . Fluticasone nasal spray PRN . Loratadine 10 mg daily - PRN . Triamcinolone 0.5% ointment  We discussed:  Patient is satisfied with current regimen and denies issues  Plan  Continue current medications  Medication Management   Pt uses [facility] for all medications Uses pill box? Yes Pt endorses 100% compliance  We discussed: Discussed benefits of pill packaging and delivery with [facility]; pt is satisfied with [facility] and may pursue pill packaging there; pt reports medications are affordable, even Eliquis  Plan  Continue current medication management strategy    Follow up: 3 month phone visit  Charlene Brooke, PharmD, BCACP Clinical Pharmacist [facility] [phone]

## 2019-08-31 NOTE — Patient Instructions (Addendum)
Visit Information  Phone number for Pharmacist: [phone]  Thank you for meeting with me to discuss your medications! I look forward to working with you to achieve your health care goals. Below is a summary of what we talked about during the visit:  Goals Addressed            This Visit's Progress   . Pharmacy Care Plan       CARE PLAN ENTRY (see longitudinal plan of care for additional care plan information)  Current Barriers:  . Chronic Disease Management support, education, and care coordination needs related to Hypertension, Hyperlipidemia, Diabetes, Atrial Fibrillation,   Hypertension BP Readings from Last 3 Encounters:  08/23/19 137/79  08/11/19 (!) 168/84  08/02/19 138/80 .  Pharmacist Clinical Goal(s): o Over the next 90 days, patient will work with PharmD and providers to maintain BP goal <140/90 . Current regimen:  o Amlodipine 5 mg daily o Benazepril 40 mg daily o Carvedilol 6.25 mg twice a day . Interventions: o Discussed BP goals and benefits of medication for prevention of heart attack / stroke . Patient self care activities - Over the next 90 days, patient will: o Check BP daily, document, and provide at future appointments o Ensure daily salt intake < 2300 mg/day  Hyperlipidemia Lab Results  Component Value Date/Time   LDLCALC 83 06/21/2019 09:24 AM .  Pharmacist Clinical Goal(s): o Over the next 90 days, patient will work with PharmD and providers to maintain LDL goal < 100 . Current regimen:  o Atorvastatin 10 mg daily o OTC Fish oil 1000 mg daily . Interventions: o Discussed cholesterol goals and benefits of medication for prevention of heart attack / stroke . Patient self care activities - Over the next 90 days, patient will: o Continue current medications o Continue low choelsterol diet  Diabetes Lab Results  Component Value Date/Time   HGBA1C 6.0 06/21/2019 09:24 AM   HGBA1C 6.1 12/06/2018 12:03 PM .  Pharmacist Clinical  Goal(s): o Over the next 90 days, patient will work with PharmD and providers to maintain A1c goal <7% . Current regimen:  o No medications . Interventions: o Discussed A1c, blood sugar goals and benefits of medication for prevention of diabetic complications . Patient self care activities - Over the next 90 days, patient will: o Check blood sugar in the morning before eating or drinking, document, and provide at future appointments o Contact provider with any episodes of hypoglycemia  Atrial Fibrilliation . Pharmacist Clinical Goal(s) o Over the next 90 days, patient will work with PharmD and providers to optimize therapy . Current regimen:  o Carvedilol 6.25 mg BID o Eliquis 5 mg BID . Interventions: o Discussed how Afib can increase risk for stroke o Discussed how carvedilol helps prevent Afib, and Eliquis helps prevent strokes . Patient self care activities - Over the next 90 days, patient will: o Continue medications as prescribed o Monitor for signs/symptoms of bleeding  Polymyalgia Rheumatica . Pharmacist Clinical Goal(s) o Over the next 90 days, patient will work with PharmD and providers to optimize therapy . Current regimen:  o Hydroxychloroquine 200 mg daily M-F o Diclofenac 1% gel 3 times daily as needed o Folic acid 272 mcg daily . Interventions: o Discussed benefits of folic acid . Patient self care activities - Over the next 90 days, patient will: o Refill folic acid  Medication management . Pharmacist Clinical Goal(s): o Over the next 90 days, patient will work with PharmD and providers to maintain optimal  medication adherence . Current pharmacy: [facility] . Interventions o Comprehensive medication review performed. o Continue current medication management strategy . Patient self care activities - Over the next 90 days, patient will: o Focus on medication adherence by pill box o Take medications as prescribed o Report any questions or concerns to PharmD  and/or provider(s)  Initial goal documentation       Ms. Sparrow was given information about Chronic Care Management services today including:  1. CCM service includes personalized support from designated clinical staff supervised by her physician, including individualized plan of care and coordination with other care providers 2. 24/7 contact phone numbers for assistance for urgent and routine care needs. 3. Standard insurance, coinsurance, copays and deductibles apply for chronic care management only during months in which we provide at least 20 minutes of these services. Most insurances cover these services at 100%, however patients may be responsible for any copay, coinsurance and/or deductible if applicable. This service may help you avoid the need for more expensive face-to-face services. 4. Only one practitioner may furnish and bill the service in a calendar month. 5. The patient may stop CCM services at any time (effective at the end of the month) by phone call to the office staff.  Patient agreed to services and verbal consent obtained.   The patient verbalized understanding of instructions provided today and agreed to receive a mailed copy of patient instruction and/or educational materials. Telephone follow up appointment with pharmacy team member scheduled for: 3 months  Charlene Brooke, PharmD Clinical Pharmacist [facility] [phone]  Atrial Fibrillation  Atrial fibrillation is a type of heartbeat that is irregular or fast. If you have this condition, your heart beats without any order. This makes it hard for your heart to pump blood in a normal way. Atrial fibrillation may come and go, or it may become a long-lasting problem. If this condition is not treated, it can put you at higher risk for stroke, heart failure, and other heart problems. What are the causes? This condition may be caused by diseases that damage the heart. They  include:  High blood pressure.  Heart failure.  Heart valve disease.  Heart surgery. Other causes include:  Diabetes.  Thyroid disease.  Being overweight.  Kidney disease. Sometimes the cause is not known. What increases the risk? You are more likely to develop this condition if:  You are older.  You smoke.  You exercise often and very hard.  You have a family history of this condition.  You are a man.  You use drugs.  You drink a lot of alcohol.  You have lung conditions, such as emphysema, pneumonia, or COPD.  You have sleep apnea. What are the signs or symptoms? Common symptoms of this condition include:  A feeling that your heart is beating very fast.  Chest pain or discomfort.  Feeling short of breath.  Suddenly feeling light-headed or weak.  Getting tired easily during activity.  Fainting.  Sweating. In some cases, there are no symptoms. How is this treated? Treatment for this condition depends on underlying conditions and how you feel when you have atrial fibrillation. They include:  Medicines to: ? Prevent blood clots. ? Treat heart rate or heart rhythm problems.  Using devices, such as a pacemaker, to correct heart rhythm problems.  Doing surgery to remove the part of the heart that sends bad signals.  Closing an area where clots can form in the heart (left atrial appendage).  In some cases, your doctor will treat other underlying conditions. Follow these instructions at home: Medicines  Take over-the-counter and prescription medicines only as told by your doctor.  Do not take any new medicines without first talking to your doctor.  If you are taking blood thinners: ? Talk with your doctor before you take any medicines that have aspirin or NSAIDs, such as ibuprofen, in them. ? Take your medicine exactly as told by your doctor. Take it at the same time each day. ? Avoid activities that could hurt or bruise you. Follow instructions  about how to prevent falls. ? Wear a bracelet that says you are taking blood thinners. Or, carry a card that lists what medicines you take. Lifestyle      Do not use any products that have nicotine or tobacco in them. These include cigarettes, e-cigarettes, and chewing tobacco. If you need help quitting, ask your doctor.  Eat heart-healthy foods. Talk with your doctor about the right eating plan for you.  Exercise regularly as told by your doctor.  Do not drink alcohol.  Lose weight if you are overweight.  Do not use drugs, including cannabis. General instructions  If you have a condition that causes breathing to stop for a short period of time (apnea), treat it as told by your doctor.  Keep a healthy weight. Do not use diet pills unless your doctor says they are safe for you. Diet pills may make heart problems worse.  Keep all follow-up visits as told by your doctor. This is important. Contact a doctor if:  You notice a change in the speed, rhythm, or strength of your heartbeat.  You are taking a blood-thinning medicine and you get more bruising.  You get tired more easily when you move or exercise.  You have a sudden change in weight. Get help right away if:   You have pain in your chest or your belly (abdomen).  You have trouble breathing.  You have side effects of blood thinners, such as blood in your vomit, poop (stool), or pee (urine), or bleeding that cannot stop.  You have any signs of a stroke. "BE FAST" is an easy way to remember the main warning signs: ? B - Balance. Signs are dizziness, sudden trouble walking, or loss of balance. ? E - Eyes. Signs are trouble seeing or a change in how you see. ? F - Face. Signs are sudden weakness or loss of feeling in the face, or the face or eyelid drooping on one side. ? A - Arms. Signs are weakness or loss of feeling in an arm. This happens suddenly and usually on one side of the body. ? S - Speech. Signs are sudden  trouble speaking, slurred speech, or trouble understanding what people say. ? T - Time. Time to call emergency services. Write down what time symptoms started.  You have other signs of a stroke, such as: ? A sudden, very bad headache with no known cause. ? Feeling like you may vomit (nausea). ? Vomiting. ? A seizure. These symptoms may be an emergency. Do not wait to see if the symptoms will go away. Get medical help right away. Call your local emergency services (911 in the [place] Do not drive yourself to the hospital. Summary  Atrial fibrillation is a type of heartbeat that is irregular or fast.  You are at higher risk of this condition if you smoke, are older, have diabetes, or are overweight.  Follow your doctor's instructions about  medicines, diet, exercise, and follow-up visits.  Get help right away if you have signs or symptoms of a stroke.  Get help right away if you cannot catch your breath, or you have chest pain or discomfort. This information is not intended to replace advice given to you by your health care provider. Make sure you discuss any questions you have with your health care provider. Document Revised: 06/29/2018 Document Reviewed: 06/29/2018 Elsevier Patient Education  [place].

## 2019-09-03 ENCOUNTER — Other Ambulatory Visit: Payer: Self-pay | Admitting: Internal Medicine

## 2019-09-04 ENCOUNTER — Ambulatory Visit: Payer: PPO | Admitting: Cardiology

## 2019-09-04 ENCOUNTER — Encounter: Payer: Self-pay | Admitting: Cardiology

## 2019-09-04 VITALS — BP 148/74 | HR 63 | Ht 69.0 in | Wt 193.0 lb

## 2019-09-04 DIAGNOSIS — I4892 Unspecified atrial flutter: Secondary | ICD-10-CM

## 2019-09-04 MED ORDER — DILTIAZEM HCL ER COATED BEADS 180 MG PO CP24
180.0000 mg | ORAL_CAPSULE | Freq: Every day | ORAL | 1 refills | Status: DC
Start: 2019-09-04 — End: 2019-09-07

## 2019-09-04 NOTE — Patient Instructions (Signed)
Your physician recommends that you schedule a follow-up appointment in: [place] has recommended you make the following change in your medication:   STOP CARVEDILOL   STOP AMLODIPINE   START DILTIAZEM 180 MG DAILY   Thank you for choosing [facility]!!

## 2019-09-04 NOTE — Progress Notes (Signed)
Clinical Summary Ms. Faulk is a 74 y.o.female seen today for follow up of the following medical problems. This is a focused visit on recent symptoms of palpitations.    1. Aflutter/ Atach - noted during 07/2019 admission. Started on lopressor and eliquis at that time.  - - seen in ER 08/23/19 with palpitations, SOB. EKG at the stime shows NSR  - recent episodes of palpitations. Often in the morning. Heart rates at home elevated to 150s a ttimes, particulary in the morning - she is no longer taking metoprolol, I cannot tell from her chart what happened to it. Has been started on coreg 6.15m bid appears during the 08/23/19 ER visit    Past Medical History:  Diagnosis Date  . Breast cancer ([facility] 1990[place]   right breast  . Collagen vascular disease ([facility]   . Diabetes mellitus without complication ([facility]   . GERD (gastroesophageal reflux disease)   . HTN (hypertension)   . Hypercholesterolemia   . Obesity   . Osteoarthritis      Allergies  Allergen Reactions  . Levofloxacin Hives and Itching  . Nitrofurantoin Hives and Itching  . Penicillins Hives and Itching  . Sulfonamide Derivatives Itching  . Pantoprazole Other (See Comments)    Bad dreams     Current Outpatient Medications  Medication Sig Dispense Refill  . amLODipine (NORVASC) 5 MG tablet TAKE 1 TABLET BY MOUTH EVERY DAY 90 tablet 0  . apixaban (ELIQUIS) 5 MG TABS tablet Take 1 tablet (5 mg total) by mouth 2 (two) times daily. 60 tablet 2  . atorvastatin (LIPITOR) 10 MG tablet TAKE 1 TABLET BY MOUTH DAILY 90 tablet 3  . benazepril (LOTENSIN) 40 MG tablet TAKE 1 TABLET BY MOUTH EVERY DAY 90 tablet 1  . Blood Glucose Monitoring Suppl (ONETOUCH VERIO) w/Device KIT USE TO TEST BLOOD SUGAR ONCE DAILY AS DIRECTED 1 kit 0  . calcium-vitamin D (OSCAL WITH D) 500-200 MG-UNIT tablet Take 1 tablet by mouth daily with breakfast.    . carvedilol (COREG) 6.25 MG tablet Take 1 tablet (6.25 mg total) by mouth 2 (two) times daily  with a meal. 60 tablet 0  . diclofenac sodium (VOLTAREN) 1 % GEL Apply 3 g to 3 large joints up to 3 times daily. 3 Tube 3  . famotidine (PEPCID) 20 MG tablet Take 20 mg by mouth 2 (two) times daily. (Patient not taking: Reported on 08/31/2019)    . fluticasone (FLONASE) 50 MCG/ACT nasal spray USE 2 SPRAYS IN EACH NOSTRIL EVERY DAY 48 g 0  . folic acid (FOLVITE) 4426MCG tablet Take 400 mcg by mouth daily.    .Marland Kitchenglucose blood test strip USE TO TEST BLOOD SUGAR ONCE DAILY. E11.40 100 each 3  . hydroxychloroquine (PLAQUENIL) 200 MG tablet TAKE 1 TABLET BY MOUTH ONCE DAILY ON MONDAY-FRIDAY 60 tablet 0  . loratadine (CLARITIN) 10 MG tablet TAKE 1 TABLET BY MOUTH EVERY DAY 30 tablet 2  . metoCLOPramide (REGLAN) 10 MG tablet Take 10 mg by mouth See admin instructions. TAKE 1 TABLET BY MOUTH AFTER MEALS AND AT 9:00pm AND 3:00am FOR SEVEN DAYS. (Patient not taking: Reported on 08/31/2019)    . Multiple Vitamin (MULTIVITAMIN) tablet Take 1 tablet by mouth daily.    .Marland Kitchennystatin (MYCOSTATIN) 100000 UNIT/ML suspension Take 5 mLs (500,000 Units total) by mouth 4 (four) times daily. Use for 10 days 200 mL 0  . Omega-3 Fatty Acids (FISH OIL) 1000 MG CAPS Take 1 capsule by mouth  daily.     . omeprazole (PRILOSEC) 40 MG capsule TAKE ONE CAPSULE BY MOUTH DAILY 90 capsule 3  . ONETOUCH DELICA LANCETS 15X MISC USE TO TEST ONCE DAILY 100 each 2  . ONETOUCH VERIO test strip USE TO TEST BLOOD SUGAR ONCE DAILY 100 each 0  . triamcinolone ointment (KENALOG) 0.5 % Apply 1 application topically 2 (two) times daily. Do not use for more than 14 days in a row 15 g 0   No current facility-administered medications for this visit.     Past Surgical History:  Procedure Laterality Date  . ABDOMINAL HYSTERECTOMY  1993   [place], fibroids  . COLONOSCOPY  May 2003   Dr. Tamala Julian: normal  . COLONOSCOPY  06/22/2011   Procedure: COLONOSCOPY;  Surgeon: Danie Binder, MD;  Location: [facility];  Service: Endoscopy;  Laterality: N/A;  10:30    . MASTECTOMY  1995   right      Allergies  Allergen Reactions  . Levofloxacin Hives and Itching  . Nitrofurantoin Hives and Itching  . Penicillins Hives and Itching  . Sulfonamide Derivatives Itching  . Pantoprazole Other (See Comments)    Bad dreams      Family History  Problem Relation Age of Onset  . Diabetes Mother        diabetic coma  . Heart failure Mother   . Diabetes Father   . Emphysema Father   . Diabetes Sister   . Hypercholesterolemia Sister   . Hypercholesterolemia Brother   . Diabetes Brother   . Hypertension Brother   . Hypercholesterolemia Brother   . Diabetes Sister   . Hypertension Sister   . Hypercholesterolemia Sister   . Hypercholesterolemia Sister   . Hypercholesterolemia Sister   . Diabetes Brother   . Hypertension Brother   . Colon cancer Neg Hx      Social History Ms. Noren reports that she has never smoked. She has never used smokeless tobacco. Ms. Drummer reports no history of alcohol use.   Review of Systems CONSTITUTIONAL: No weight loss, fever, chills, weakness or fatigue.  HEENT: Eyes: No visual loss, blurred vision, double vision or yellow sclerae.No hearing loss, sneezing, congestion, runny nose or sore throat.  SKIN: No rash or itching.  CARDIOVASCULAR: per hpi RESPIRATORY: No shortness of breath, cough or sputum.  GASTROINTESTINAL: No anorexia, nausea, vomiting or diarrhea. No abdominal pain or blood.  GENITOURINARY: No burning on urination, no polyuria NEUROLOGICAL: No headache, dizziness, syncope, paralysis, ataxia, numbness or tingling in the extremities. No change in bowel or bladder control.  MUSCULOSKELETAL: No muscle, back pain, joint pain or stiffness.  LYMPHATICS: No enlarged nodes. No history of splenectomy.  PSYCHIATRIC: No history of depression or anxiety.  ENDOCRINOLOGIC: No reports of sweating, cold or heat intolerance. No polyuria or polydipsia.  Marland Kitchen   Physical Examination Today's Vitals   09/04/19  1540  BP: (!) 148/74  Pulse: 63  SpO2: 98%  Weight: 193 lb (87.5 kg)  Height: 5' 9" (1.753 m)   Body mass index is 28.5 kg/m.  Gen: resting comfortably, no acute distress HEENT: no scleral icterus, pupils equal round and reactive, no palptable cervical adenopathy,  CV Resp: Clear to auscultation bilaterally GI: abdomen is soft, non-tender, non-distended, normal bowel sounds, no hepatosplenomegaly MSK: extremities are warm, no edema.  Skin: warm, no rash Neuro:  no focal deficits Psych: appropriate affect   Diagnostic Studies  05/2019 nuclear stress  No diagnostic ST segment changes to indicate ischemia.  No significant  myocardial perfusion defects to indicate scar or ischemia. There is apical breast attenuation artifact  This is a low risk study.  Nuclear stress EF: 72%.  04/2019 echo IMPRESSIONS    1. Left ventricular ejection fraction, by estimation, is 60 to 65%. The  left ventricle has normal function. The left ventricle has no regional  wall motion abnormalities. There is mild left ventricular hypertrophy.  Left ventricular diastolic parameters  were normal.  2. Right ventricular systolic function is normal. The right ventricular  size is normal. There is mildly elevated pulmonary artery systolic  pressure.  3. The mitral valve is normal in structure. Trivial mitral valve  regurgitation. No evidence of mitral stenosis.  4. The aortic valve is tricuspid. Aortic valve regurgitation is not  visualized. No aortic stenosis is present.  5. The inferior vena cava is normal in size with greater than 50%  respiratory variability, suggesting right atrial pressure of 3 mmHg.    Assessment and Plan   1. Aflutter/atach - unclear what happened to her lopressor. She was started on coreg during recent ER visit. Im not sure if there was a side effect or indication.  - would look to simplify her regimen. Stop coreg, start dilt 116m daily.  - continue eliquis.  -  titrate dilt as needed, if high dose and refractory consider beta blocker at that time.   F/u 6 weeks    JArnoldo Lenis M.D.

## 2019-09-06 ENCOUNTER — Telehealth: Payer: Self-pay | Admitting: Cardiology

## 2019-09-06 NOTE — Telephone Encounter (Signed)
Pt called stating the diltiazem (CARDIZEM CD) 180 MG 24 hr capsule [[id number]]  Is making her dizzy and her left foot doesn't feel right  No chest pain, no new SOB  Please call [phone]

## 2019-09-06 NOTE — Telephone Encounter (Signed)
Pt c/o dizziness when moving around since starting diltiazem 8/16  says dizziness has been worse today every time she gets up to move around - last few BP readings and HRs - 145/87 HR 75 140/78 HR  69 139/74 HR 69 136/75 HR 68 - also says her feet feel "heavy" denies any swelling or any other symptoms

## 2019-09-06 NOTE — Progress Notes (Signed)
Subjective:    Patient ID: Angela Simmons, female    DOB: January 11, 1946, 74 y.o.   MRN: [id number]  HPI The patient is here for follow up of her BP  Bp has been elevated since she was here last. She saw cardio three days ago and it was discovered she was no longer taking the lopressor, but should have been. ( looks like it was stopped due to side effects) She was started on coreg at the ED visit 8/4.  Cardiology stopped her coreg and she was started on dilt which can be titrated as needed.    Her BP at home  134/89  145/87  75 139/74  69 136/75  68 132/84  70  Her BP cuff here today is 173/143.   She has palpitations at times.  She states sometimes this occurs with activity.  She states some dizziness and is unsure if that is from the medication or if it typically occurs with exertion/palpitations.  She is not a good historian.   Sugars 76-96   Medications and allergies reviewed with patient and updated - she did bring all her medications in.   Patient Active Problem List   Diagnosis Date Noted  . A-fib ([place]) 08/04/2019  . Thrush 08/02/2019  . Anemia 08/02/2019  . Community acquired pneumonia 07/22/2019  . Burning sensation 06/21/2019  . DOE (dyspnea on exertion) 04/03/2019  . Ankle swelling, left 04/03/2019  . Hyperpigmented skin lesion 04/03/2019  . Poor balance 12/06/2018  . Headache 12/06/2018  . Vertigo 06/07/2018  . Urinary frequency 06/07/2018  . Lower abdominal pain 06/07/2018  . Herpes zoster without complication 40/98/1191  . Tingling in extremities 11/26/2017  . Ischial bursitis of left side 06/02/2017  . Sicca syndrome ([place]) 06/23/2016  . Bilateral leg edema 04/07/2016  . Burning sensation of skin 04/07/2016  . PMR (polymyalgia rheumatica) (HCC) 03/27/2016  . Osteopenia 03/04/2016  . Autoimmune disease ([place]) 03/18/2015  . Inflammatory arthritis 03/18/2015  . Joint pain 11/22/2014  . Overweight (BMI 25.0-29.9) 08/18/2014  . Low back pain with  radiation 08/18/2014  . Hyperlipidemia 06/28/2014  . Chest pain 04/11/2014  . Tendonitis of shoulder 08/29/2013  . S/P right mastectomy 11/14/2012  . Type 2 diabetes, controlled, with neuropathy ([place]) 10/05/2012  . Allergic rhinitis 01/26/2011  . GERD 11/10/2009  . INTERSTITIAL CYSTITIS 10/31/2008  . BENIGN NEOPLASM OF VULVA 02/01/2008  . Essential hypertension, benign 02/08/2007  . History of cancer of right breast 02/08/2007    Current Outpatient Medications on File Prior to Visit  Medication Sig Dispense Refill  . apixaban (ELIQUIS) 5 MG TABS tablet Take 1 tablet (5 mg total) by mouth 2 (two) times daily. 60 tablet 2  . atorvastatin (LIPITOR) 10 MG tablet TAKE 1 TABLET BY MOUTH DAILY 90 tablet 3  . benazepril (LOTENSIN) 40 MG tablet TAKE 1 TABLET BY MOUTH EVERY DAY 90 tablet 1  . Blood Glucose Monitoring Suppl (ONETOUCH VERIO) w/Device KIT USE TO TEST BLOOD SUGAR ONCE DAILY AS DIRECTED 1 kit 0  . calcium-vitamin D (OSCAL WITH D) 500-200 MG-UNIT tablet Take 1 tablet by mouth daily with breakfast.    . diclofenac sodium (VOLTAREN) 1 % GEL Apply 3 g to 3 large joints up to 3 times daily. 3 Tube 3  . diltiazem (CARDIZEM CD) 180 MG 24 hr capsule Take 1 capsule (180 mg total) by mouth daily. 90 capsule 1  . famotidine (PEPCID) 20 MG tablet Take 20 mg by mouth 2 (two) times daily.     Marland Kitchen  fluticasone (FLONASE) 50 MCG/ACT nasal spray USE 2 SPRAYS IN EACH NOSTRIL EVERY DAY 48 g 0  . folic acid (FOLVITE) 778 MCG tablet Take 400 mcg by mouth daily.    Marland Kitchen glucose blood test strip USE TO TEST BLOOD SUGAR ONCE DAILY. E11.40 100 each 3  . hydroxychloroquine (PLAQUENIL) 200 MG tablet TAKE 1 TABLET BY MOUTH ONCE DAILY ON MONDAY-FRIDAY 60 tablet 0  . loratadine (CLARITIN) 10 MG tablet TAKE 1 TABLET BY MOUTH EVERY DAY 30 tablet 2  . metoCLOPramide (REGLAN) 10 MG tablet Take 10 mg by mouth See admin instructions. TAKE 1 TABLET BY MOUTH AFTER MEALS AND AT 9:00pm AND 3:00am FOR SEVEN DAYS.    Marland Kitchen Multiple  Vitamin (MULTIVITAMIN) tablet Take 1 tablet by mouth daily.    Marland Kitchen nystatin (MYCOSTATIN) 100000 UNIT/ML suspension Take 5 mLs (500,000 Units total) by mouth 4 (four) times daily. Use for 10 days 200 mL 0  . Omega-3 Fatty Acids (FISH OIL) 1000 MG CAPS Take 1 capsule by mouth daily.     Marland Kitchen omeprazole (PRILOSEC) 40 MG capsule TAKE ONE CAPSULE BY MOUTH DAILY 90 capsule 3  . ONETOUCH DELICA LANCETS 24M MISC USE TO TEST ONCE DAILY 100 each 2  . ONETOUCH VERIO test strip USE TO TEST BLOOD SUGAR ONCE DAILY 100 each 0  . triamcinolone ointment (KENALOG) 0.5 % Apply 1 application topically 2 (two) times daily. Do not use for more than 14 days in a row 15 g 0   No current facility-administered medications on file prior to visit.    Past Medical History:  Diagnosis Date  . Breast cancer ([place]) 1990s   right breast  . Collagen vascular disease ([place])   . Diabetes mellitus without complication ([place])   . GERD (gastroesophageal reflux disease)   . HTN (hypertension)   . Hypercholesterolemia   . Obesity   . Osteoarthritis     Past Surgical History:  Procedure Laterality Date  . ABDOMINAL [place]CT[place]Y  [place]  . COLONOSCOPY  May 2003   Dr. Tamala Julian: normal  . COLONOSCOPY  06/22/2011   Procedure: COLONOSCOPY;  Surgeon: Danie Binder, MD;  [facility];  Service: Endoscopy;  Laterality: N/A;  10:30  . MASTECTOMY  1995   right     Social History   Socioeconomic History  . Marital status: Married    Spouse name: Not on file  . Number of children: 3  . Years of education: 7  . Highest education level: Not on file  Occupational History  . Not on file  Tobacco Use  . Smoking status: Never Smoker  . Smokeless tobacco: Never Used  Vaping Use  . Vaping Use: Never used  Substance and Sexual Activity  . Alcohol use: No  . Drug use: Never  . Sexual activity: Never    Birth control/protection: Rhythm  Other Topics Concern  . Not on file  Social History Narrative   Lives at  home with husband   Caffeine, maybe 1 a day   Social Determinants of Health   Financial Resource Strain: Low Risk   . Difficulty of Paying Living Expenses: Not very hard  Food Insecurity:   . Worried About Charity fundraiser in the Last Year: Not on file  . Ran Out of Food in the Last Year: Not on file  Transportation Needs:   . Lack of Transportation (Medical): Not on file  . Lack of Transportation (Non-Medical): Not on file  Physical Activity:   .  Days of Exercise per Week: Not on file  . Minutes of Exercise per Session: Not on file  Stress:   . Feeling of Stress : Not on file  Social Connections:   . Frequency of Communication with Friends and Family: Not on file  . Frequency of Social Gatherings with Friends and Family: Not on file  . Attends Religious Services: Not on file  . Active Member of Clubs or Organizations: Not on file  . Attends Archivist Meetings: Not on file  . Marital Status: Not on file    Family History  Problem Relation Age of Onset  . Diabetes Mother        diabetic coma  . Heart failure Mother   . Diabetes Father   . Emphysema Father   . Diabetes Sister   . Hypercholesterolemia Sister   . Hypercholesterolemia Brother   . Diabetes Brother   . Hypertension Brother   . Hypercholesterolemia Brother   . Diabetes Sister   . Hypertension Sister   . Hypercholesterolemia Sister   . Hypercholesterolemia Sister   . Hypercholesterolemia Sister   . Diabetes Brother   . Hypertension Brother   . Colon cancer Neg Hx     Review of Systems  Constitutional: Negative for fever.  Respiratory: Positive for shortness of breath (a little). Negative for cough and wheezing.   Cardiovascular: Positive for palpitations (once in a whilte when she is doing something) and leg swelling (left foot). Negative for chest pain.  Gastrointestinal: Positive for nausea (occ with palpitations).  Neurological: Positive for dizziness (occ with palpitations) and  headaches.       Objective:   Vitals:   09/07/19 0948  BP: (!) 160/84  Pulse: 68  Temp: 98.1 F (36.7 C)  SpO2: 99%   BP Readings from Last 3 Encounters:  09/07/19 (!) 160/84  09/04/19 (!) 148/74  08/23/19 137/79   Wt Readings from Last 3 Encounters:  09/07/19 192 lb (87.1 kg)  09/04/19 193 lb (87.5 kg)  08/22/19 194 lb (88 kg)   Body mass index is 28.35 kg/m.   Physical Exam    Constitutional: Appears well-developed and well-nourished. No distress.  HENT:  Head: Normocephalic and atraumatic.  Neck: Neck supple. No tracheal deviation present. No thyromegaly present.  No cervical lymphadenopathy Cardiovascular: Normal rate, regular rhythm and normal heart sounds.   No murmur heard. No carotid bruit .  No edema Pulmonary/Chest: Effort normal and breath sounds normal. No respiratory distress. No has no wheezes. No rales.  Skin: Skin is warm and dry. Not diaphoretic.  Psychiatric: Normal mood and affect. Behavior is normal.      Assessment & Plan:    See Problem List for Assessment and Plan of chronic medical problems.    This visit occurred during the SARS-CoV-2 public health emergency.  Safety protocols were in place, including screening questions prior to the visit, additional usage of staff PPE, and extensive cleaning of exam room while observing appropriate contact time as indicated for disinfecting solutions.

## 2019-09-07 ENCOUNTER — Other Ambulatory Visit: Payer: Self-pay

## 2019-09-07 ENCOUNTER — Telehealth: Payer: Self-pay

## 2019-09-07 ENCOUNTER — Ambulatory Visit (INDEPENDENT_AMBULATORY_CARE_PROVIDER_SITE_OTHER): Payer: PPO | Admitting: Internal Medicine

## 2019-09-07 ENCOUNTER — Encounter: Payer: Self-pay | Admitting: Internal Medicine

## 2019-09-07 DIAGNOSIS — I10 Essential (primary) hypertension: Secondary | ICD-10-CM | POA: Diagnosis not present

## 2019-09-07 DIAGNOSIS — I4891 Unspecified atrial fibrillation: Secondary | ICD-10-CM

## 2019-09-07 DIAGNOSIS — K219 Gastro-esophageal reflux disease without esophagitis: Secondary | ICD-10-CM

## 2019-09-07 MED ORDER — DILTIAZEM HCL ER COATED BEADS 240 MG PO CP24: 240.0000 mg | ORAL_CAPSULE | Freq: Every day | ORAL | 5 refills | Status: DC

## 2019-09-07 MED ORDER — TURMERIC 500 MG PO CAPS: 1.0000 | ORAL_CAPSULE | Freq: Every day | ORAL | Status: DC

## 2019-09-07 MED ORDER — GINGER (ZINGIBER OFFICINALIS) 550 MG PO CAPS: 1.0000 | ORAL_CAPSULE | Freq: Every day | ORAL | 0 refills | Status: DC

## 2019-09-07 MED ORDER — CENTRUM SILVER 50+WOMEN PO TABS: 1.0000 | ORAL_TABLET | Freq: Every day | ORAL | Status: AC

## 2019-09-07 NOTE — Patient Instructions (Addendum)
  Medications reviewed and updated.  Changes include :   Increase the diltiazem to 240 mg daily.  Your prescription(s) have been submitted to your pharmacy. Please take as directed and contact our office if you believe you are having problem(s) with the medication(s).     Please followup as scheduled

## 2019-09-07 NOTE — Assessment & Plan Note (Signed)
Chronic GERD controlled Continue daily medication Omeprazole daily

## 2019-09-07 NOTE — Telephone Encounter (Signed)
New message    Daughter Tressia Danas asking the CMA to call her back to discuss today visit - medication

## 2019-09-07 NOTE — Assessment & Plan Note (Addendum)
Chronic increase Diltiazem to 240 mg daily If this is not tolerated advised her to call cardiology -was already been on metoprolol and coreg Monitor BP at home  She does have some palpitations and dizziness, but is very unclear when these occur-?  Related to medication, atrial fibrillation or other.  Advised her to monitor the symptoms more closely

## 2019-09-07 NOTE — Assessment & Plan Note (Signed)
Following with cardiology In sinus here today on exam HR has been good - will increase diltiazem for better BP control On eliquis

## 2019-09-07 NOTE — Telephone Encounter (Signed)
Pt went to Dr Quay Burow today and BP was high at appt so Dr Quay Burow increased Dilt to 240 mg daily - pt says she did mention the dizziness at the appt - pt just took the 240 mg of dilt after she got home 15 mins ago and wanted to know Dr Nelly Laurence recs on this

## 2019-09-07 NOTE — Telephone Encounter (Signed)
Lets try a lower dose of the diltiazem, can she start the short acting diltiazem 30mg  tid to start. Pending response can make further changes and would not plan on her having to take 3 times daily for long, have her update [place] Monday on symptoms.   Zandra Abts MD

## 2019-09-08 ENCOUNTER — Telehealth: Payer: Self-pay | Admitting: Rheumatology

## 2019-09-08 ENCOUNTER — Ambulatory Visit: Payer: PPO | Admitting: Cardiology

## 2019-09-08 NOTE — Telephone Encounter (Signed)
Patient requested a return call to let her know if she is due for labwork. ?

## 2019-09-08 NOTE — Telephone Encounter (Signed)
Spoke with patient.

## 2019-09-08 NOTE — Telephone Encounter (Signed)
Patient advised she is due for labs in November and has a scheduled appointment in November. Patient advised she can have labs done at that appointment.

## 2019-09-14 DIAGNOSIS — L84 Corns and callosities: Secondary | ICD-10-CM | POA: Diagnosis not present

## 2019-09-14 DIAGNOSIS — M79676 Pain in unspecified toe(s): Secondary | ICD-10-CM | POA: Diagnosis not present

## 2019-09-14 DIAGNOSIS — B351 Tinea unguium: Secondary | ICD-10-CM | POA: Diagnosis not present

## 2019-09-14 DIAGNOSIS — E1142 Type 2 diabetes mellitus with diabetic polyneuropathy: Secondary | ICD-10-CM | POA: Diagnosis not present

## 2019-09-18 NOTE — Telephone Encounter (Signed)
Pt has f/u scheduled in September

## 2019-09-19 NOTE — Progress Notes (Signed)
Reviewed chart and insurance data for medication adherence. Patient does not have any gaps in adherence and is not in danger of failing any Medicare adherence measures. No further action required. ° °

## 2019-09-21 ENCOUNTER — Telehealth: Payer: Self-pay | Admitting: Internal Medicine

## 2019-09-21 NOTE — Telephone Encounter (Signed)
    Patient calling to discuss diltiazem (CARDIZEM CD) 240 MG 24 hr capsule. Patient feels the medication causes her to have headaches and dizziness and itching

## 2019-09-21 NOTE — Telephone Encounter (Signed)
Called pt back to clarify sxs. Pt states the diltiazem is giving her headaches and she's dizzy at times. She states at night her legs get hot. She states she check her BP this am around 8 something it was 153/95. She just rechecked it and it was 118/72. Inform pt MD is out of the office today, but will return on tomorrow. Once she review msg we will give her a call back w/MD recommendations.Marland KitchenJohny Chess

## 2019-09-21 NOTE — Telephone Encounter (Signed)
Notified pt w/MD response.../lmb 

## 2019-09-21 NOTE — Telephone Encounter (Signed)
Cardiology started this for her Afib because she did not tolerate any of the beta blockers. ( I did increase the dose at her last visit)  I think she needs to call them to see what other options there are to control her Afib

## 2019-09-26 ENCOUNTER — Telehealth: Payer: Self-pay | Admitting: Cardiology

## 2019-09-26 DIAGNOSIS — H15112 Episcleritis periodica fugax, left eye: Secondary | ICD-10-CM | POA: Diagnosis not present

## 2019-09-26 NOTE — Telephone Encounter (Signed)
New message       Patient  States she was put on diltiazem (CARDIZEM CD) 180mg  and Dr Quay Burow put her on diltiazem (CARDIZEM CD) 240 MG 24 hr capsule and it was too strong for her.   She is gonna need a refill of the diltiazem (CARDIZEM CD) 180mg   she does not want to take the 240 mg   Call in refill to [facility]  30 day supply

## 2019-09-26 NOTE — Telephone Encounter (Signed)
Ok to refill 180 mg of dilt?

## 2019-09-27 MED ORDER — DILTIAZEM HCL ER COATED BEADS 180 MG PO CP24
180.0000 mg | ORAL_CAPSULE | Freq: Every day | ORAL | 1 refills | Status: DC
Start: 2019-09-27 — End: 2020-01-05

## 2019-09-27 NOTE — Telephone Encounter (Signed)
Did she tolerate the 180mg  of dilt, we had gotten a call earlier that was also causing dizziness. Does she feel like she tolerated the 180mg  dose?   Zandra Abts MD

## 2019-09-27 NOTE — Telephone Encounter (Signed)
Medication sent to pharmacy  

## 2019-09-27 NOTE — Telephone Encounter (Signed)
Can refill the dilt 180mg  daily   Zandra Abts MD

## 2019-09-27 NOTE — Telephone Encounter (Signed)
Pt says she tolerated the diltiazem 180 mg fine she tried the 240 mg recently for a few days and said she could not tolerate that dose it made her feel weak

## 2019-10-03 ENCOUNTER — Ambulatory Visit: Payer: PPO | Admitting: Rheumatology

## 2019-10-06 ENCOUNTER — Telehealth: Payer: Self-pay

## 2019-10-06 NOTE — Telephone Encounter (Signed)
Printed records from [place] and scanned into release with ROI auth for pt pickup      Release:  [id number]

## 2019-10-09 DIAGNOSIS — Z20828 Contact with and (suspected) exposure to other viral communicable diseases: Secondary | ICD-10-CM | POA: Diagnosis not present

## 2019-10-12 ENCOUNTER — Other Ambulatory Visit: Payer: Self-pay | Admitting: Cardiology

## 2019-10-12 MED ORDER — APIXABAN 5 MG PO TABS: 5.0000 mg | ORAL_TABLET | Freq: Two times a day (BID) | ORAL | 6 refills | Status: DC

## 2019-10-12 NOTE — Telephone Encounter (Signed)
Refill request

## 2019-10-12 NOTE — Addendum Note (Signed)
Addended by: Malen Gauze on: 10/12/2019 11:19 AM   Modules accepted: Orders

## 2019-10-12 NOTE — Telephone Encounter (Signed)
Received refill request for Eliquis.  Pt last saw Dr Harl Bowie on 09/04/19.  Reviewed labs from 08/23/19:  SCr 0.82  CrCl 84.02  Hgb 12.2  Hct 39.0  Plts 238  Wt 87.1kg  Age 75  Based on above finding Eliquis 5mg  twice daily is the appropriate dose.  Refill approved.

## 2019-10-16 ENCOUNTER — Telehealth (INDEPENDENT_AMBULATORY_CARE_PROVIDER_SITE_OTHER): Payer: PPO | Admitting: Cardiology

## 2019-10-16 ENCOUNTER — Other Ambulatory Visit: Payer: Self-pay

## 2019-10-16 ENCOUNTER — Encounter: Payer: Self-pay | Admitting: Cardiology

## 2019-10-16 VITALS — BP 148/86 | HR 81 | Ht 69.5 in | Wt 200.0 lb

## 2019-10-16 DIAGNOSIS — I4892 Unspecified atrial flutter: Secondary | ICD-10-CM

## 2019-10-16 MED ORDER — METOPROLOL TARTRATE 25 MG PO TABS
25.0000 mg | ORAL_TABLET | Freq: Two times a day (BID) | ORAL | 6 refills | Status: DC
Start: 2019-10-16 — End: 2020-06-18

## 2019-10-16 NOTE — Patient Instructions (Signed)
Medication Instructions:   Begin Lopressor 25mg  twice a day.  Continue all other medications.    Labwork: none  Testing/Procedures: none  Follow-Up: 6 weeks   Any Other Special Instructions Will Be Listed Below (If Applicable).  If you need a refill on your cardiac medications before your next appointment, please call your pharmacy.

## 2019-10-16 NOTE — Progress Notes (Signed)
Virtual Visit via Telephone Note   This visit type was conducted due to national recommendations for restrictions regarding the COVID-19 Pandemic (e.g. social distancing) in an effort to limit this patient's exposure and mitigate transmission in our community.  Due to her co-morbid illnesses, this patient is at least at moderate risk for complications without adequate follow up.  This format is felt to be most appropriate for this patient at this time.  The patient did not have access to video technology/had technical difficulties with video requiring transitioning to audio format only (telephone).  All issues noted in this document were discussed and addressed.  No physical exam could be performed with this format.  Please refer to the patient's chart for her  consent to telehealth for [facility]    Date:  10/16/2019   ID:  Angela Simmons, DOB [birth year], MRN [id number] The patient was identified using 2 identifiers.  Patient Location: Home Provider Location: Office/Clinic  PCP:  Binnie Rail, MD  Cardiologist:  Carlyle Dolly, MD  Electrophysiologist:  None   Evaluation Performed:  Follow-Up Visit  Chief Complaint:  Follow up  History of Present Illness:    Angela Simmons is a 74 y.o. female seen today for follow up iof the following medical problems. This is a focused visit on her history of aflutter/atach  1. Aflutter/ Atach - noted during 07/2019 admission. Started on lopressor and eliquis at that time.  - - seen in ER 08/23/19 with palpitations, SOB. EKG at the stime shows NSR  - recent episodes of palpitations. Often in the morning. Heart rates at home elevated to 150s a ttimes, particulary in the morning - she is no longer taking metoprolol, I cannot tell from her chart what happened to it. Has been started on coreg 6.25mg  bid appears during the 08/23/19 ER visit   - last vist was started on dilt 180, this was increased to 240mg  by pcp. She felt like the 240 was too  strong for her, made her feel dizzy,  and is back on the 180mg  - at times palpitaitons, typically with activities. Lasts a few seconds - no bleeding on eliquis.      The patient does not have symptoms concerning for COVID-19 infection (fever, chills, cough, or new shortness of breath).    Past Medical History:  Diagnosis Date  . Breast cancer ([place]) 1990s   right breast  . Collagen vascular disease ([place])   . Diabetes mellitus without complication ([place])   . GERD (gastroesophageal reflux disease)   . HTN (hypertension)   . Hypercholesterolemia   . Obesity   . Osteoarthritis    Past Surgical History:  Procedure Laterality Date  . ABDOMINAL [place]CTOMY  [place]   [place], [place]roids  . COLONOSCOPY  May 2003   Dr. Tamala Julian: normal  . COLONOSCOPY  06/22/2011   Procedure: COLONOSCOPY;  Surgeon: Danie Binder, MD;  [facility];  Service: Endoscopy;  Laterality: N/A;  10:30  . MASTECTOMY  1995   right      No outpatient medications have been marked as taking for the 10/16/19 encounter (Appointment) with Arnoldo Lenis, MD.     Allergies:   Levofloxacin, Nitrofurantoin, Penicillins, Sulfonamide derivatives, and Pantoprazole   Social History   Tobacco Use  . Smoking status: Never Smoker  . Smokeless tobacco: Never Used  Vaping Use  . Vaping Use: Never used  Substance Use Topics  . Alcohol use: No  . Drug use: Never  Family Hx: The patient's family history includes Diabetes in her brother, brother, father, mother, sister, and sister; Emphysema in her father; Heart failure in her mother; Hypercholesterolemia in her brother, brother, sister, sister, sister, and sister; Hypertension in her brother, brother, and sister. There is no history of Colon cancer.  ROS:   Please see the history of present illness.     All other systems reviewed and are negative.   Prior CV studies:   The following studies were reviewed today:  05/2019 nuclear stress  No diagnostic ST  segment changes to indicate ischemia.  No significant myocardial perfusion defects to indicate scar or ischemia. There is apical breast attenuation artifact  This is a low risk study.  Nuclear stress EF: 72%.  04/2019 echo IMPRESSIONS    1. Left ventricular ejection fraction, by estimation, is 60 to 65%. The  left ventricle has normal function. The left ventricle has no regional  wall motion abnormalities. There is mild left ventricular hypertrophy.  Left ventricular diastolic parameters  were normal.  2. Right ventricular systolic function is normal. The right ventricular  size is normal. There is mildly elevated pulmonary artery systolic  pressure.  3. The mitral valve is normal in structure. Trivial mitral valve  regurgitation. No evidence of mitral stenosis.  4. The aortic valve is tricuspid. Aortic valve regurgitation is not  visualized. No aortic stenosis is present.  5. The inferior vena cava is normal in size with greater than 50%  respiratory variability, suggesting right atrial pressure of 3 mmHg.    Labs/Other Tests and Data Reviewed:    EKG:  No ECG reviewed.  Recent Labs: 07/22/2019: B Natriuretic Peptide 74.3 07/25/2019: ALT 39; Magnesium 2.2 08/23/2019: BUN 7; Creatinine, Ser 0.82; Hemoglobin 12.2; Platelets 238; Potassium 3.8; Sodium 139   Recent Lipid Panel Lab Results  Component Value Date/Time   CHOL 142 06/21/2019 09:24 AM   TRIG 38.0 06/21/2019 09:24 AM   HDL 51.40 06/21/2019 09:24 AM   CHOLHDL 3 06/21/2019 09:24 AM   LDLCALC 83 06/21/2019 09:24 AM    Wt Readings from Last 3 Encounters:  09/07/19 192 lb (87.1 kg)  09/04/19 193 lb (87.5 kg)  08/22/19 194 lb (88 kg)     Objective:    Vital Signs:   Today's Vitals   10/16/19 0853  BP: (!) 148/86  Pulse: 81  Weight: 200 lb (90.7 kg)  Height: 5' 9.5" (1.765 m)   Body mass index is 29.11 kg/m. Normal affect. Normal speech pattern and tone. Comfortable, no apparent distress. No audible  signs of sob or wheezing.   ASSESSMENT & PLAN:    1. Aflutter/atach -some ongoing symptoms, we will add lopressor 25mg  bid and monitor symptoms    COVID-19 Education: The signs and symptoms of COVID-19 were discussed with the patient and how to seek care for testing (follow up with PCP or arrange E-visit).  The importance of social distancing was discussed today.  Time:   Today, I have spent 13 minutes with the patient with telehealth technology discussing the above problems.     Medication Adjustments/Labs and Tests Ordered: Current medicines are reviewed at length with the patient today.  Concerns regarding medicines are outlined above.   Tests Ordered: No orders of the defined types were placed in this encounter.   Medication Changes: No orders of the defined types were placed in this encounter.   Follow Up:  In Person in 6 week(s)  Signed, Carlyle Dolly, MD  10/16/2019 8:22 AM  [facility]

## 2019-10-19 ENCOUNTER — Telehealth: Payer: Self-pay | Admitting: Cardiology

## 2019-10-19 ENCOUNTER — Telehealth: Payer: Self-pay | Admitting: *Deleted

## 2019-10-19 NOTE — Telephone Encounter (Signed)
Called to confirm what her new medication is called that was prescribed on Monday. Advised that metoprolol tartrate 25 mg by mouth twice daily was sent to [facility]. Verbalized understanding.

## 2019-10-19 NOTE — Telephone Encounter (Signed)
Patient called stating that she would like to talk to someone about her new medication.

## 2019-10-19 NOTE — Telephone Encounter (Signed)
Angela Simmons [phone]).  "Did you receive emailed form?  What is the status of [employer] [place] [employer] form?  It needs to be returned.  Seen by a Dr, Sonny Dandy, then Dr. Marin Olp back in 1995.  Do not know where surgery (mastectomy) was performed in 1995.  Spoke with a Hispanic lady on second floor.  Signed a release of information for her to process but have not received a call or records needed.  Have been on disability a long time.  Been with my current general provider about three years so she does not know the information from 53."   Transferred to [facility] for records request.  Advised Angela Simmons to connect with [place] for clarification of information needed.    Notify attending physician Billey Gosling, MD of forms.  Current assessment information provides best usable information for [employer] [place] review to process disability, abilities and needs.    No oncology appointments or further information noted per [facility] EMR does not provide appointment or further information.    Provided [place] number to request medical records department if needed  for surgical/hospital records.     "I will send someone to pick up the form."

## 2019-10-20 ENCOUNTER — Telehealth: Payer: Self-pay | Admitting: *Deleted

## 2019-10-20 NOTE — Telephone Encounter (Signed)
"  Marcelino Scot [phone]).  Mom spoke with someone yesterday.  Was told the [employer] form I sent was not received yet I came by yesterday and picked up two envelopes."    This nurse called pt. 10/19/2019, 2:16 pm to inform her she received medical record information only in the two envelopes received earlier.  Records previously prepared by [facility] have been awaiting pick up.  The form requested yesterday morning prepared by afternoon for pick up.  Registation staff expressed information had been picked up earlier.    Apologize for any inconvenience.  Suggested check email sent to [email or web address] as alternatie to retrieve form.   Asked with previous call when she would come for pick up yet no day, time provided.  No return call received with pick-up information; Only "I do not know.  Will have to send someone to pick it up within a day or so."     "We have an issue going with [employer].  She is correct requesting Dr. Marin Olp complete the " Attending Physician Statement" and obtain health care records during time she received care.  Is this [phone] number for Dr. Marin Olp?"  Confirmed phone numbers and information previously provided to patient.  Currently no further Questions or needs.

## 2019-10-23 DIAGNOSIS — Z20828 Contact with and (suspected) exposure to other viral communicable diseases: Secondary | ICD-10-CM | POA: Diagnosis not present

## 2019-10-25 ENCOUNTER — Ambulatory Visit: Payer: PPO | Admitting: Internal Medicine

## 2019-10-26 ENCOUNTER — Other Ambulatory Visit: Payer: Self-pay

## 2019-10-26 ENCOUNTER — Ambulatory Visit (INDEPENDENT_AMBULATORY_CARE_PROVIDER_SITE_OTHER)
Admission: RE | Admit: 2019-10-26 | Discharge: 2019-10-26 | Disposition: A | Discharge status: Home / Self Care | Payer: PPO | Source: Ambulatory Visit | Attending: Acute Care | Admitting: Acute Care

## 2019-10-26 DIAGNOSIS — J90 Pleural effusion, not elsewhere classified: Secondary | ICD-10-CM | POA: Diagnosis not present

## 2019-10-26 DIAGNOSIS — R0602 Shortness of breath: Secondary | ICD-10-CM | POA: Diagnosis not present

## 2019-10-26 DIAGNOSIS — J984 Other disorders of lung: Secondary | ICD-10-CM | POA: Diagnosis not present

## 2019-10-26 DIAGNOSIS — I270 Primary pulmonary hypertension: Secondary | ICD-10-CM | POA: Diagnosis not present

## 2019-10-26 DIAGNOSIS — I700 Atherosclerosis of aorta: Secondary | ICD-10-CM | POA: Diagnosis not present

## 2019-10-26 NOTE — Progress Notes (Signed)
Subjective:    Patient ID: Angela Simmons, female    DOB: 19-Sep-1945, 74 y.o.   MRN: [id number]  HPI The patient is here for an acute visit.  She is here with her daughter.     Disability forms - to decrease her life insurance premiums.  She was first disabled in 1995 after her R sided breast cancer.  She is s/p mastectomy and tamoxifen.  She was treated at [facility]    Symptoms as a result of above - limited with right arm. She no longer sees oncology.   She is R handed.   She last worked in [place] with husband.  Does housework,  Does some cooking.   Sees cardio, pulm for DOE, afib   Wears glasses  Walks a little.  Limited by SOB.   Can walk from here from parking lot then needs to rest.    Stands 20 min at at time only  Sitting - no pain with sitting- 15 min  She drives short distances only.    Lifts 1-10 lbs occ in both arms  No stairs in house - she difficulty going up stairs   Medications and allergies reviewed with patient and updated if appropriate.  Patient Active Problem List   Diagnosis Date Noted  . A-fib ([place]) 08/04/2019  . Thrush 08/02/2019  . Anemia 08/02/2019  . Community acquired pneumonia 07/22/2019  . Burning sensation 06/21/2019  . DOE (dyspnea on exertion) 04/03/2019  . Ankle swelling, left 04/03/2019  . Hyperpigmented skin lesion 04/03/2019  . Poor balance 12/06/2018  . Headache 12/06/2018  . Vertigo 06/07/2018  . Urinary frequency 06/07/2018  . Lower abdominal pain 06/07/2018  . Herpes zoster without complication 57/26/2035  . Tingling in extremities 11/26/2017  . Ischial bursitis of left side 06/02/2017  . Sicca syndrome ([place]) 06/23/2016  . Bilateral leg edema 04/07/2016  . Burning sensation of skin 04/07/2016  . PMR (polymyalgia rheumatica) ([facility]) 03/27/2016  . Osteopenia 03/04/2016  . Autoimmune disease ([place]) 03/18/2015  . Inflammatory arthritis 03/18/2015  . Joint pain 11/22/2014  . Overweight (BMI 25.0-29.9)  08/18/2014  . Low back pain with radiation 08/18/2014  . Hyperlipidemia 06/28/2014  . Chest pain 04/11/2014  . Tendonitis of shoulder 08/29/2013  . S/P right mastectomy 11/14/2012  . Type 2 diabetes, controlled, with neuropathy ([place]) 10/05/2012  . Allergic rhinitis 01/26/2011  . GERD 11/10/2009  . INTERSTITIAL CYSTITIS 10/31/2008  . BENIGN NEOPLASM OF VULVA 02/01/2008  . Essential hypertension, benign 02/08/2007  . History of cancer of right breast 02/08/2007    Current Outpatient Medications on File Prior to Visit  Medication Sig Dispense Refill  . amLODipine (NORVASC) 5 MG tablet Take 5 mg by mouth daily.    Marland Kitchen apixaban (ELIQUIS) 5 MG TABS tablet Take 1 tablet (5 mg total) by mouth 2 (two) times daily. 60 tablet 6  . atorvastatin (LIPITOR) 10 MG tablet TAKE 1 TABLET BY MOUTH DAILY 90 tablet 3  . benazepril (LOTENSIN) 40 MG tablet TAKE 1 TABLET BY MOUTH EVERY DAY 90 tablet 1  . Blood Glucose Monitoring Suppl (ONETOUCH VERIO) w/Device KIT USE TO TEST BLOOD SUGAR ONCE DAILY AS DIRECTED 1 kit 0  . diclofenac sodium (VOLTAREN) 1 % GEL Apply 3 g to 3 large joints up to 3 times daily. 3 Tube 3  . diltiazem (CARDIZEM CD) 180 MG 24 hr capsule Take 1 capsule (180 mg total) by mouth daily. 90 capsule 1  . famotidine (PEPCID) 20  MG tablet Take 20 mg by mouth 2 (two) times daily.    . fluticasone (FLONASE) 50 MCG/ACT nasal spray USE 2 SPRAYS IN EACH NOSTRIL EVERY DAY 48 g 0  . Ginger, Zingiber officinalis, 550 MG CAPS Take 1 capsule (550 mg total) by mouth daily. 60 capsule 0  . glucose blood test strip USE TO TEST BLOOD SUGAR ONCE DAILY. E11.40 100 each 3  . hydroxychloroquine (PLAQUENIL) 200 MG tablet TAKE 1 TABLET BY MOUTH ONCE DAILY ON MONDAY-FRIDAY 60 tablet 0  . loratadine (CLARITIN) 10 MG tablet TAKE 1 TABLET BY MOUTH EVERY DAY 30 tablet 2  . metoprolol tartrate (LOPRESSOR) 25 MG tablet Take 1 tablet (25 mg total) by mouth 2 (two) times daily. 60 tablet 6  . Multiple Vitamin (MULTIVITAMIN)  tablet Take 1 tablet by mouth daily.    . Multiple Vitamins-Minerals (CENTRUM SILVER 50+WOMEN) TABS Take 1 tablet by mouth daily at 6 (six) AM.    . Omega-3 Fatty Acids (FISH OIL) 1000 MG CAPS Take 1 capsule by mouth daily.     Marland Kitchen omeprazole (PRILOSEC) 40 MG capsule TAKE ONE CAPSULE BY MOUTH DAILY 90 capsule 3  . ONETOUCH DELICA LANCETS 17C MISC USE TO TEST ONCE DAILY 100 each 2  . ONETOUCH VERIO test strip USE TO TEST BLOOD SUGAR ONCE DAILY 100 each 0  . triamcinolone ointment (KENALOG) 0.5 % Apply 1 application topically 2 (two) times daily. Do not use for more than 14 days in a row 15 g 0  . Turmeric (QC TUMERIC COMPLEX) 500 MG CAPS Take 1 capsule by mouth daily.     No current facility-administered medications on file prior to visit.    Past Medical History:  Diagnosis Date  . Breast cancer ([place]) 1990s   right breast  . Collagen vascular disease ([place])   . Diabetes mellitus without complication ([place])   . GERD (gastroesophageal reflux disease)   . HTN (hypertension)   . Hypercholesterolemia   . Obesity   . Osteoarthritis     Past Surgical History:  Procedure Laterality Date  . ABDOM[place]  [place]  . COLONOSCOPY  May 2003   Dr. Tamala Julian: normal  . COLONOSCOPY  06/22/2011   Procedure: COLONOSCOPY;  Surgeon: Danie Binder, MD;  Location: [facility];  Service: Endoscopy;  Laterality: N/A;  10:30  . MASTECTOMY  1995   right     Social History   Socioeconomic History  . Marital status: Married    Spouse name: Not on file  . Number of children: 3  . Years of education: 7  . Highest education level: Not on file  Occupational History  . Not on file  Tobacco Use  . Smoking status: Never Smoker  . Smokeless tobacco: Never Used  Vaping Use  . Vaping Use: Never used  Substance and Sexual Activity  . Alcohol use: No  . Drug use: Never  . Sexual activity: Never    Birth control/protection: Rhythm  Other Topics Concern  . Not on file  Social History  Narrative   Lives at home with husband   Caffeine, maybe 1 a day   Social Determinants of Health   Financial Resource Strain: Low Risk   . Difficulty of Paying Living Expenses: Not very hard  Food Insecurity:   . Worried About Charity fundraiser in the Last Year: Not on file  . Ran Out of Food in the Last Year: Not on file  Transportation Needs:   .  Lack of Transportation (Medical): Not on file  . Lack of Transportation (Non-Medical): Not on file  Physical Activity:   . Days of Exercise per Week: Not on file  . Minutes of Exercise per Session: Not on file  Stress:   . Feeling of Stress : Not on file  Social Connections:   . Frequency of Communication with Friends and Family: Not on file  . Frequency of Social Gatherings with Friends and Family: Not on file  . Attends Religious Services: Not on file  . Active Member of Clubs or Organizations: Not on file  . Attends Archivist Meetings: Not on file  . Marital Status: Not on file    Family History  Problem Relation Age of Onset  . Diabetes Mother        diabetic coma  . Heart failure Mother   . Diabetes Father   . Emphysema Father   . Diabetes Sister   . Hypercholesterolemia Sister   . Hypercholesterolemia Brother   . Diabetes Brother   . Hypertension Brother   . Hypercholesterolemia Brother   . Diabetes Sister   . Hypertension Sister   . Hypercholesterolemia Sister   . Hypercholesterolemia Sister   . Hypercholesterolemia Sister   . Diabetes Brother   . Hypertension Brother   . Colon cancer Neg Hx     Review of Systems  Constitutional: Negative for chills and fever.  HENT: Positive for postnasal drip.   Respiratory: Positive for cough, shortness of breath (wtih exertion) and wheezing.   Cardiovascular: Positive for chest pain (on occasion), palpitations and leg swelling.  Gastrointestinal:       Jerrye Bushy controlled  Musculoskeletal: Positive for arthralgias, back pain (comes and goes), gait problem and  neck pain.  Neurological: Positive for numbness (N/T in b/l hands).       Objective:   Vitals:   10/27/19 1426  BP: (!) 150/82  Pulse: (!) 51  Temp: 98.2 F (36.8 C)  SpO2: 97%   BP Readings from Last 3 Encounters:  10/27/19 (!) 150/82  10/16/19 (!) 148/86  09/07/19 (!) 160/84   Wt Readings from Last 3 Encounters:  10/27/19 195 lb (88.5 kg)  10/16/19 200 lb (90.7 kg)  09/07/19 192 lb (87.1 kg)   Body mass index is 28.38 kg/m.   Physical Exam Constitutional:      General: She is not in acute distress.    Appearance: Normal appearance. She is not ill-appearing.  HENT:     Head: Normocephalic and atraumatic.  Cardiovascular:     Rate and Rhythm: Normal rate and regular rhythm.  Pulmonary:     Effort: Pulmonary effort is normal. No respiratory distress.     Breath sounds: No wheezing or rales.  Musculoskeletal:     Right lower leg: No edema.     Left lower leg: No edema.  Skin:    General: Skin is warm and dry.  Neurological:     General: No focal deficit present.     Mental Status: She is alert.     Cranial Nerves: No cranial nerve deficit.     Sensory: No sensory deficit.     Motor: No weakness.  Psychiatric:        Mood and Affect: Mood normal.           Assessment & Plan:    See Problem List for Assessment and Plan of chronic medical problems.    This visit occurred during the SARS-CoV-2 public health emergency.  Safety  protocols were in place, including screening questions prior to the visit, additional usage of staff PPE, and extensive cleaning of exam room while observing appropriate contact time as indicated for disinfecting solutions.

## 2019-10-27 ENCOUNTER — Encounter: Payer: Self-pay | Admitting: Internal Medicine

## 2019-10-27 ENCOUNTER — Ambulatory Visit (INDEPENDENT_AMBULATORY_CARE_PROVIDER_SITE_OTHER): Payer: PPO | Admitting: Internal Medicine

## 2019-10-27 DIAGNOSIS — Z853 Personal history of malignant neoplasm of breast: Secondary | ICD-10-CM

## 2019-10-27 DIAGNOSIS — M359 Systemic involvement of connective tissue, unspecified: Secondary | ICD-10-CM | POA: Diagnosis not present

## 2019-10-27 DIAGNOSIS — K219 Gastro-esophageal reflux disease without esophagitis: Secondary | ICD-10-CM

## 2019-10-27 NOTE — Patient Instructions (Signed)
You can pick up the paperwork on Monday.

## 2019-10-28 NOTE — Assessment & Plan Note (Signed)
Dx 1995, s/p mastectomy S/p tamoxifen No longer following oncology Subjective weakness in R arm

## 2019-10-28 NOTE — Assessment & Plan Note (Signed)
chronic Has joint pain, neck/back pain Poor balance , subjective weakness Following with rheumatology On plaquenil

## 2019-10-28 NOTE — Assessment & Plan Note (Signed)
Chronic GERD controlled Continue omeprazole 40 mg daily 

## 2019-10-30 ENCOUNTER — Other Ambulatory Visit: Payer: Self-pay

## 2019-10-30 ENCOUNTER — Ambulatory Visit: Payer: PPO | Admitting: Critical Care Medicine

## 2019-10-30 ENCOUNTER — Ambulatory Visit: Payer: PPO | Admitting: Pulmonary Disease

## 2019-10-30 ENCOUNTER — Encounter: Payer: Self-pay | Admitting: Pulmonary Disease

## 2019-10-30 ENCOUNTER — Telehealth: Payer: Self-pay | Admitting: Internal Medicine

## 2019-10-30 VITALS — BP 136/70 | HR 50 | Temp 95.7°F | Ht 69.0 in | Wt 192.6 lb

## 2019-10-30 DIAGNOSIS — R590 Localized enlarged lymph nodes: Secondary | ICD-10-CM

## 2019-10-30 DIAGNOSIS — J90 Pleural effusion, not elsewhere classified: Secondary | ICD-10-CM | POA: Diagnosis not present

## 2019-10-30 NOTE — Patient Instructions (Signed)
Thank you for visiting Dr. Miner Koral at [facility]. Today we recommend the following:  Return if symptoms worsen or fail to improve.    Please do your part to reduce the spread of COVID-19.  

## 2019-10-30 NOTE — Progress Notes (Signed)
Synopsis: Referred in October 2021 for follow-up CT chest by Binnie Rail, MD  Subjective:   PATIENT ID: Angela Simmons GENDER: female DOB: 01/21/1945, MRN: [id number]  Chief Complaint  Patient presents with  . Consult    established with provider, CT results    This is a 74 year old female history of right breast cancer status post mastectomy, hypertension, reflux, hyperlipidemia, obesity.  She had a hospital admission in which she had a CT scan of the chest completed which revealed mediastinal adenopathy and small bilateral pleural effusion.  This was done in July 2021.  She had a follow-up noncontrasted CT of the chest in October 2021 to evaluate the mediastinal adenopathy and she is here today for follow-up regarding this.  She has no respiratory complaints.  She is able to complete her activities of daily living.  She is recovered from her hospitalization.     Past Medical History:  Diagnosis Date  . Breast cancer ([place]) 1990s   right breast  . Collagen vascular disease ([place])   . Diabetes mellitus without complication ([place])   . GERD (gastroesophageal reflux disease)   . HTN (hypertension)   . Hypercholesterolemia   . Obesity   . Osteoarthritis      Family History  Problem Relation Age of Onset  . Diabetes Mother        diabetic coma  . Heart failure Mother   . Diabetes Father   . Emphysema Father   . Diabetes Sister   . Hypercholesterolemia Sister   . Hypercholesterolemia Brother   . Diabetes Brother   . Hypertension Brother   . Hypercholesterolemia Brother   . Diabetes Sister   . Hypertension Sister   . Hypercholesterolemia Sister   . Hypercholesterolemia Sister   . Hypercholesterolemia Sister   . Diabetes Brother   . Hypertension Brother   . Colon cancer Neg Hx      Past Surgical History:  Procedure Laterality Date  . ABDOM[place]Y  [place]  . COLONOSCOPY  May 2003   Dr. Tamala Julian: normal  . COLONOSCOPY  06/22/2011   Procedure:  COLONOSCOPY;  Surgeon: Danie Binder, MD;  Location: [facility];  Service: Endoscopy;  Laterality: N/A;  10:30  . MASTECTOMY  1995   right     Social History   Socioeconomic History  . Marital status: Married    Spouse name: Not on file  . Number of children: 3  . Years of education: 7  . Highest education level: Not on file  Occupational History  . Not on file  Tobacco Use  . Smoking status: Never Smoker  . Smokeless tobacco: Never Used  Vaping Use  . Vaping Use: Never used  Substance and Sexual Activity  . Alcohol use: No  . Drug use: Never  . Sexual activity: Never    Birth control/protection: Rhythm  Other Topics Concern  . Not on file  Social History Narrative   Lives at home with husband   Caffeine, maybe 1 a day   Social Determinants of Health   Financial Resource Strain: Low Risk   . Difficulty of Paying Living Expenses: Not very hard  Food Insecurity:   . Worried About Charity fundraiser in the Last Year: Not on file  . Ran Out of Food in the Last Year: Not on file  Transportation Needs:   . Lack of Transportation (Medical): Not on file  . Lack of Transportation (Non-Medical): Not on file  Physical Activity:   . Days of Exercise per Week: Not on file  . Minutes of Exercise per Session: Not on file  Stress:   . Feeling of Stress : Not on file  Social Connections:   . Frequency of Communication with Friends and Family: Not on file  . Frequency of Social Gatherings with Friends and Family: Not on file  . Attends Religious Services: Not on file  . Active Member of Clubs or Organizations: Not on file  . Attends Archivist Meetings: Not on file  . Marital Status: Not on file  Intimate Partner Violence:   . Fear of Current or Ex-Partner: Not on file  . Emotionally Abused: Not on file  . Physically Abused: Not on file  . Sexually Abused: Not on file     Allergies  Allergen Reactions  . Levofloxacin Hives and Itching  . Nitrofurantoin  Hives and Itching  . Penicillins Hives and Itching  . Sulfonamide Derivatives Itching  . Pantoprazole Other (See Comments)    Bad dreams     Outpatient Medications Prior to Visit  Medication Sig Dispense Refill  . amLODipine (NORVASC) 5 MG tablet Take 5 mg by mouth daily.    Marland Kitchen apixaban (ELIQUIS) 5 MG TABS tablet Take 1 tablet (5 mg total) by mouth 2 (two) times daily. 60 tablet 6  . atorvastatin (LIPITOR) 10 MG tablet TAKE 1 TABLET BY MOUTH DAILY 90 tablet 3  . benazepril (LOTENSIN) 40 MG tablet TAKE 1 TABLET BY MOUTH EVERY DAY 90 tablet 1  . Blood Glucose Monitoring Suppl (ONETOUCH VERIO) w/Device KIT USE TO TEST BLOOD SUGAR ONCE DAILY AS DIRECTED 1 kit 0  . diclofenac sodium (VOLTAREN) 1 % GEL Apply 3 g to 3 large joints up to 3 times daily. 3 Tube 3  . diltiazem (CARDIZEM CD) 180 MG 24 hr capsule Take 1 capsule (180 mg total) by mouth daily. 90 capsule 1  . famotidine (PEPCID) 20 MG tablet Take 20 mg by mouth 2 (two) times daily.    . fluticasone (FLONASE) 50 MCG/ACT nasal spray USE 2 SPRAYS IN EACH NOSTRIL EVERY DAY 48 g 0  . Ginger, Zingiber officinalis, 550 MG CAPS Take 1 capsule (550 mg total) by mouth daily. 60 capsule 0  . glucose blood test strip USE TO TEST BLOOD SUGAR ONCE DAILY. E11.40 100 each 3  . hydroxychloroquine (PLAQUENIL) 200 MG tablet TAKE 1 TABLET BY MOUTH ONCE DAILY ON MONDAY-FRIDAY 60 tablet 0  . loratadine (CLARITIN) 10 MG tablet TAKE 1 TABLET BY MOUTH EVERY DAY 30 tablet 2  . metoprolol tartrate (LOPRESSOR) 25 MG tablet Take 1 tablet (25 mg total) by mouth 2 (two) times daily. 60 tablet 6  . Multiple Vitamin (MULTIVITAMIN) tablet Take 1 tablet by mouth daily.    . Multiple Vitamins-Minerals (CENTRUM SILVER 50+WOMEN) TABS Take 1 tablet by mouth daily at 6 (six) AM.    . Omega-3 Fatty Acids (FISH OIL) 1000 MG CAPS Take 1 capsule by mouth daily.     Marland Kitchen omeprazole (PRILOSEC) 40 MG capsule TAKE ONE CAPSULE BY MOUTH DAILY 90 capsule 3  . ONETOUCH DELICA LANCETS 72Z MISC  USE TO TEST ONCE DAILY 100 each 2  . ONETOUCH VERIO test strip USE TO TEST BLOOD SUGAR ONCE DAILY 100 each 0  . triamcinolone ointment (KENALOG) 0.5 % Apply 1 application topically 2 (two) times daily. Do not use for more than 14 days in a row 15 g 0  . Turmeric (QC TUMERIC COMPLEX)  500 MG CAPS Take 1 capsule by mouth daily.     No facility-administered medications prior to visit.    Review of Systems  Constitutional: Negative for chills, fever, malaise/fatigue and weight loss.  HENT: Negative for hearing loss, sore throat and tinnitus.   Eyes: Negative for blurred vision and double vision.  Respiratory: Negative for cough, hemoptysis, sputum production, shortness of breath, wheezing and stridor.   Cardiovascular: Negative for chest pain, palpitations, orthopnea, leg swelling and PND.  Gastrointestinal: Negative for abdominal pain, constipation, diarrhea, heartburn, nausea and vomiting.  Genitourinary: Negative for dysuria, hematuria and urgency.  Musculoskeletal: Negative for joint pain and myalgias.  Skin: Negative for itching and rash.  Neurological: Negative for dizziness, tingling, weakness and headaches.  Endo/Heme/Allergies: Negative for environmental allergies. Does not bruise/bleed easily.  Psychiatric/Behavioral: Negative for depression. The patient is not nervous/anxious and does not have insomnia.   All other systems reviewed and are negative.    Objective:  Physical Exam Vitals reviewed.  Constitutional:      General: She is not in acute distress.    Appearance: She is well-developed. She is obese.  HENT:     Head: Normocephalic and atraumatic.  Eyes:     General: No scleral icterus.    Conjunctiva/sclera: Conjunctivae normal.     Pupils: Pupils are equal, round, and reactive to light.  Neck:     Vascular: No JVD.     Trachea: No tracheal deviation.  Cardiovascular:     Rate and Rhythm: Normal rate and regular rhythm.     Heart sounds: Normal heart sounds. No  murmur heard.   Pulmonary:     Effort: No tachypnea, accessory muscle usage or respiratory distress.     Breath sounds: Normal breath sounds. No stridor. No wheezing, rhonchi or rales.  Abdominal:     General: Bowel sounds are normal. There is no distension.     Palpations: Abdomen is soft.     Tenderness: There is no abdominal tenderness.  Musculoskeletal:        General: No tenderness.     Cervical back: Neck supple.  Lymphadenopathy:     Cervical: No cervical adenopathy.  Skin:    General: Skin is warm and dry.     Capillary Refill: Capillary refill takes less than 2 seconds.     Findings: No rash.  Neurological:     Mental Status: She is alert and oriented to person, place, and time.  Psychiatric:        Behavior: Behavior normal.      Vitals:   10/30/19 1354  BP: 136/70  Pulse: (!) 50  Temp: (!) 95.7 F (35.4 C)  TempSrc: Skin  SpO2: 97%  Weight: 192 lb 9.6 oz (87.4 kg)  Height: 5' 9" (1.753 m)   97% on RA BMI Readings from Last 3 Encounters:  10/30/19 28.44 kg/m  10/27/19 28.38 kg/m  10/16/19 29.11 kg/m   Wt Readings from Last 3 Encounters:  10/30/19 192 lb 9.6 oz (87.4 kg)  10/27/19 195 lb (88.5 kg)  10/16/19 200 lb (90.7 kg)     CBC    Component Value Date/Time   WBC 3.7 (L) 08/23/2019 0236   RBC 4.14 08/23/2019 0236   HGB 12.2 08/23/2019 0236   HCT 39.0 08/23/2019 0236   PLT 238 08/23/2019 0236   MCV 94.2 08/23/2019 0236   MCH 29.5 08/23/2019 0236   MCHC 31.3 08/23/2019 0236   RDW 13.8 08/23/2019 0236   LYMPHSABS 1,453 08/02/2019 1516  MONOABS 0.3 06/07/2018 1113   EOSABS 59 08/02/2019 1516   BASOSABS 29 08/02/2019 1516     Chest Imaging: 10/26/2019 CT chest: Resolution of mediastinal adenopathy in comparison to previous CT imaging in July.The patient's images have been independently reviewed by me.    Pulmonary Functions Testing Results: No flowsheet data found.  FeNO:   Pathology:   Echocardiogram:   Heart  Catheterization:     Assessment & Plan:     ICD-10-CM   1. Mediastinal adenopathy  R59.0   2. Pleural effusion  J90     Discussion:  This is a 74 year old female that had hospitalization in July 2021 for pneumonia, chest pain, atrial flutter.  During that time CT scan of the chest revealed mediastinal adenopathy and due to history of breast cancer recommended noncontrasted CT imaging follow-up.  Here today for review of this image.  Plan: She has resolution of previous noted mediastinal adenopathy and small pleural effusions. No additional follow-up needed at this time. We discussed the results with the patient and patient's daughter in the office today. We also reviewed images to compare them from her July images until now.  Patient to follow-up in our clinic as needed.   Current Outpatient Medications:  .  amLODipine (NORVASC) 5 MG tablet, Take 5 mg by mouth daily., Disp: , Rfl:  .  apixaban (ELIQUIS) 5 MG TABS tablet, Take 1 tablet (5 mg total) by mouth 2 (two) times daily., Disp: 60 tablet, Rfl: 6 .  atorvastatin (LIPITOR) 10 MG tablet, TAKE 1 TABLET BY MOUTH DAILY, Disp: 90 tablet, Rfl: 3 .  benazepril (LOTENSIN) 40 MG tablet, TAKE 1 TABLET BY MOUTH EVERY DAY, Disp: 90 tablet, Rfl: 1 .  Blood Glucose Monitoring Suppl (ONETOUCH VERIO) w/Device KIT, USE TO TEST BLOOD SUGAR ONCE DAILY AS DIRECTED, Disp: 1 kit, Rfl: 0 .  diclofenac sodium (VOLTAREN) 1 % GEL, Apply 3 g to 3 large joints up to 3 times daily., Disp: 3 Tube, Rfl: 3 .  diltiazem (CARDIZEM CD) 180 MG 24 hr capsule, Take 1 capsule (180 mg total) by mouth daily., Disp: 90 capsule, Rfl: 1 .  famotidine (PEPCID) 20 MG tablet, Take 20 mg by mouth 2 (two) times daily., Disp: , Rfl:  .  fluticasone (FLONASE) 50 MCG/ACT nasal spray, USE 2 SPRAYS IN EACH NOSTRIL EVERY DAY, Disp: 48 g, Rfl: 0 .  Ginger, Zingiber officinalis, 550 MG CAPS, Take 1 capsule (550 mg total) by mouth daily., Disp: 60 capsule, Rfl: 0 .  glucose blood test  strip, USE TO TEST BLOOD SUGAR ONCE DAILY. E11.40, Disp: 100 each, Rfl: 3 .  hydroxychloroquine (PLAQUENIL) 200 MG tablet, TAKE 1 TABLET BY MOUTH ONCE DAILY ON MONDAY-FRIDAY, Disp: 60 tablet, Rfl: 0 .  loratadine (CLARITIN) 10 MG tablet, TAKE 1 TABLET BY MOUTH EVERY DAY, Disp: 30 tablet, Rfl: 2 .  metoprolol tartrate (LOPRESSOR) 25 MG tablet, Take 1 tablet (25 mg total) by mouth 2 (two) times daily., Disp: 60 tablet, Rfl: 6 .  Multiple Vitamin (MULTIVITAMIN) tablet, Take 1 tablet by mouth daily., Disp: , Rfl:  .  Multiple Vitamins-Minerals (CENTRUM SILVER 50+WOMEN) TABS, Take 1 tablet by mouth daily at 6 (six) AM., Disp: , Rfl:  .  Omega-3 Fatty Acids (FISH OIL) 1000 MG CAPS, Take 1 capsule by mouth daily. , Disp: , Rfl:  .  omeprazole (PRILOSEC) 40 MG capsule, TAKE ONE CAPSULE BY MOUTH DAILY, Disp: 90 capsule, Rfl: 3 .  ONETOUCH DELICA LANCETS 19Q MISC, USE TO TEST  ONCE DAILY, Disp: 100 each, Rfl: 2 .  ONETOUCH VERIO test strip, USE TO TEST BLOOD SUGAR ONCE DAILY, Disp: 100 each, Rfl: 0 .  triamcinolone ointment (KENALOG) 0.5 %, Apply 1 application topically 2 (two) times daily. Do not use for more than 14 days in a row, Disp: 15 g, Rfl: 0 .  Turmeric (QC TUMERIC COMPLEX) 500 MG CAPS, Take 1 capsule by mouth daily., Disp: , Rfl:   I spent 31 minutes dedicated to the care of this patient on the date of this encounter to include pre-visit review of records, face-to-face time with the patient discussing conditions above, post visit ordering of testing, clinical documentation with the electronic health record, making appropriate referrals as documented, and communicating necessary findings to members of the patients care team.   Garner Nash, DO [facility] 10/30/2019 2:09 PM

## 2019-10-30 NOTE — Telephone Encounter (Signed)
Patients daughter called and was requesting a call back. She said that she picked up some paper work and is needing to bring it back.   Phone:[phone]

## 2019-10-31 NOTE — Telephone Encounter (Signed)
Message left for [name] that she can drop paperwork back off and let us know what is missing or needs to be fixed.

## 2019-11-01 ENCOUNTER — Other Ambulatory Visit: Payer: Self-pay | Admitting: Rheumatology

## 2019-11-01 DIAGNOSIS — Z79899 Other long term (current) drug therapy: Secondary | ICD-10-CM

## 2019-11-01 NOTE — Telephone Encounter (Signed)
Patient advised she is due to update her lab work. Patient advised we sent in a 30 day supply until labs are updated. Patient expressed understanding.

## 2019-11-01 NOTE — Telephone Encounter (Signed)
Last Visit: 06/21/2019 Next Visit: 11/22/2019 Labs: CBC 05/01/2019: White cell count is low, CMP 06/21/2019 Glucose 102 Eye exam: 01/09/2019 WNL   Current Dose per office note 06/21/2019: Plaquenil200 mg1 tabletby mouth dailyMonday to Friday   Okay to refill Plaquenil?

## 2019-11-01 NOTE — Telephone Encounter (Signed)
Please advise the patient to update lab work ASAP.  She was due to update lab work in September.   Ok to refill 30 day supply only.

## 2019-11-07 ENCOUNTER — Other Ambulatory Visit: Payer: Self-pay | Admitting: *Deleted

## 2019-11-07 DIAGNOSIS — Z79899 Other long term (current) drug therapy: Secondary | ICD-10-CM | POA: Diagnosis not present

## 2019-11-08 ENCOUNTER — Other Ambulatory Visit: Payer: Self-pay | Admitting: Internal Medicine

## 2019-11-08 LAB — COMPLETE METABOLIC PANEL WITH GFR
AG Ratio: 1.2 (calc) (ref 1.0–2.5)
ALT: 16 U/L (ref 6–29)
AST: 17 U/L (ref 10–35)
Albumin: 3.9 g/dL (ref 3.6–5.1)
Alkaline phosphatase (APISO): 72 U/L (ref 37–153)
BUN: 14 mg/dL (ref 7–25)
CO2: 28 mmol/L (ref 20–32)
Calcium: 9.5 mg/dL (ref 8.6–10.4)
Chloride: 105 mmol/L (ref 98–110)
Creat: 0.92 mg/dL (ref 0.60–0.93)
GFR, Est African American: 72 mL/min/{1.73_m2} (ref >=60)
GFR, Est Non African American: 62 mL/min/{1.73_m2} (ref >=60)
Globulin: 3.2 g/dL (calc) (ref 1.9–3.7)
Glucose, Bld: 88 mg/dL (ref 65–99)
Potassium: 4.4 mmol/L (ref 3.5–5.3)
Sodium: 141 mmol/L (ref 135–146)
Total Bilirubin: 0.5 mg/dL (ref 0.2–1.2)
Total Protein: 7.1 g/dL (ref 6.1–8.1)

## 2019-11-08 LAB — CBC WITH DIFFERENTIAL/PLATELET
Absolute Monocytes: 233 cells/uL (ref 200–950)
Basophils Absolute: 22 cells/uL (ref 0–200)
Basophils Relative: 0.7 %
Eosinophils Absolute: 22 cells/uL (ref 15–500)
Eosinophils Relative: 0.7 %
HCT: 38.4 % (ref 35.0–45.0)
Hemoglobin: 12.9 g/dL (ref 11.7–15.5)
Lymphs Abs: 964 cells/uL (ref 850–3900)
MCH: 30.4 pg (ref 27.0–33.0)
MCHC: 33.6 g/dL (ref 32.0–36.0)
MCV: 90.4 fL (ref 80.0–100.0)
MPV: 11.4 fL (ref 7.5–12.5)
Monocytes Relative: 7.5 %
Neutro Abs: 1860 cells/uL (ref 1500–7800)
Neutrophils Relative %: 60 %
Platelets: 228 10*3/uL (ref 140–400)
RBC: 4.25 10*6/uL (ref 3.80–5.10)
RDW: 13.8 % (ref 11.0–15.0)
Total Lymphocyte: 31.1 %
WBC: 3.1 10*3/uL — ABNORMAL LOW (ref 3.8–10.8)

## 2019-11-08 NOTE — Progress Notes (Signed)
Office Visit Note  Patient: Angela Simmons             Date of Birth: September 12, 1945           MRN: [id number]             PCP: Binnie Rail, MD Referring: Binnie Rail, MD Visit Date: 11/22/2019 Occupation: @GUAROCC @  Subjective:  Low back pain   History of Present Illness: Angela Simmons is a 74 y.o. female with history of autoimmune disease and PMR.  She is taking plaquenil 200 mg 1 tablet by mouth daily M-F.  She is tolerating Plaquenil without any side effects.  She denies any symptoms of a systemic autoimmune disease flare.  She denies any symptoms of Raynaud's, recent rashes, photosensitivity, enlarged lymph nodes, oral or nasal ulcerations, chest pain, palpitations, or increased shortness of breath.  She has chronic sicca symptoms and uses over-the-counter products for symptomatic relief.  She continues to have chronic fatigue which has been stable overall.  She reports that she has been experiencing intermittent discomfort in her lower back.  She states that she has been having radiating pain down the anterior aspect of her right thigh and has numbness at times.   Activities of Daily Living:  Patient reports morning stiffness for 15  minutes.   Patient Reports nocturnal pain.  Difficulty dressing/grooming: Denies Difficulty climbing stairs: Reports Difficulty getting out of chair: Reports Difficulty using hands for taps, buttons, cutlery, and/or writing: Reports  Review of Systems  Constitutional: Positive for fatigue.  HENT: Positive for mouth dryness. Negative for mouth sores and nose dryness.   Eyes: Positive for dryness. Negative for pain and visual disturbance.  Respiratory: Negative for cough, hemoptysis and difficulty breathing.   Cardiovascular: Negative for chest pain, palpitations and hypertension.  Gastrointestinal: Positive for constipation. Negative for blood in stool and diarrhea.  Genitourinary: Negative for painful urination.  Musculoskeletal: Positive for  arthralgias, joint pain, joint swelling, muscle weakness, morning stiffness and muscle tenderness. Negative for myalgias and myalgias.  Skin: Negative for color change, pallor, rash, hair loss, nodules/bumps, skin tightness, ulcers and sensitivity to sunlight.  Allergic/Immunologic: Negative for susceptible to infections.  Neurological: Positive for numbness. Negative for dizziness and headaches.  Hematological: Positive for bruising/bleeding tendency. Negative for swollen glands.  Psychiatric/Behavioral: Negative for depressed mood and sleep disturbance. The patient is not nervous/anxious.     PMFS History:  Patient Active Problem List   Diagnosis Date Noted  . A-fib ([place]) 08/04/2019  . Thrush 08/02/2019  . Anemia 08/02/2019  . Community acquired pneumonia 07/22/2019  . Burning sensation 06/21/2019  . DOE (dyspnea on exertion) 04/03/2019  . Ankle swelling, left 04/03/2019  . Hyperpigmented skin lesion 04/03/2019  . Poor balance 12/06/2018  . Headache 12/06/2018  . Vertigo 06/07/2018  . Urinary frequency 06/07/2018  . Lower abdominal pain 06/07/2018  . Herpes zoster without complication 32/44/0102  . Tingling in extremities 11/26/2017  . Ischial bursitis of left side 06/02/2017  . Sicca syndrome ([place]) 06/23/2016  . Bilateral leg edema 04/07/2016  . Burning sensation of skin 04/07/2016  . PMR (polymyalgia rheumatica) (HCC) 03/27/2016  . Osteopenia 03/04/2016  . Autoimmune disease ([place]) 03/18/2015  . Inflammatory arthritis 03/18/2015  . Joint pain 11/22/2014  . Overweight (BMI 25.0-29.9) 08/18/2014  . Low back pain with radiation 08/18/2014  . Hyperlipidemia 06/28/2014  . Chest pain 04/11/2014  . Tendonitis of shoulder 08/29/2013  . S/P right mastectomy 11/14/2012  . Type 2 diabetes, controlled, with  neuropathy ([place]) 10/05/2012  . Allergic rhinitis 01/26/2011  . GERD 11/10/2009  . INTERSTITIAL CYSTITIS 10/31/2008  . BENIGN NEOPLASM OF VULVA 02/01/2008  . Essential  hypertension, benign 02/08/2007  . History of cancer of right breast 02/08/2007    Past Medical History:  Diagnosis Date  . Breast cancer ([place]) 1990s   right breast  . Collagen vascular disease ([place])   . Diabetes mellitus without complication ([place])   . GERD (gastroesophageal reflux disease)   . HTN (hypertension)   . Hypercholesterolemia   . Obesity   . Osteoarthritis     Family History  Problem Relation Age of Onset  . Diabetes Mother        diabetic coma  . Heart failure Mother   . Diabetes Father   . Emphysema Father   . Diabetes Sister   . Hypercholesterolemia Sister   . Hypercholesterolemia Brother   . Diabetes Brother   . Hypertension Brother   . Hypercholesterolemia Brother   . Diabetes Sister   . Hypertension Sister   . Hypercholesterolemia Sister   . Hypercholesterolemia Sister   . Hypercholesterolemia Sister   . Diabetes Brother   . Hypertension Brother   . Colon cancer Neg Hx    Past Surgical History:  Procedure Laterality Date  . ABDOMINAL [place]CTOMY  [place]  . COLONOSCOPY  May 2003   Dr. Tamala Julian: normal  . COLONOSCOPY  06/22/2011   Procedure: COLONOSCOPY;  Surgeon: Danie Binder, MD;  Location: [facility];  Service: Endoscopy;  Laterality: N/A;  10:30  . MASTECTOMY  1995   right    Social History   Social History Narrative   Lives at home with husband   Caffeine, maybe 1 a day   Immunization History  Administered Date(s) Administered  . Fluad Quad(high Dose 65+) 12/06/2018  . Influenza Split 11/14/2010  . Influenza Whole 10/18/2006, 10/31/2008, 11/06/2009  . Influenza, High Dose Seasonal PF 11/22/2014, 02/21/2016, 12/18/2016, 11/05/2017  . Influenza,inj,Quad PF,6+ Mos 11/14/2012, 11/23/2013  . Moderna SARS-COVID-2 Vaccination 03/16/2019, 04/13/2019  . Pneumococcal Conjugate-13 04/11/2014  . Pneumococcal Polysaccharide-23 09/17/2011  . Td 07/05/2003  . Zoster 06/15/2013  . Zoster Recombinat (Shingrix) 11/30/2017, 02/02/2018      Objective: Vital Signs: BP 126/77 (BP Location: Left Arm, Patient Position: Sitting, Cuff Size: Normal)   Pulse (!) 56   Resp 16   Ht 5' 9"  (1.753 m)   Wt 194 lb 12.8 oz (88.4 kg)   BMI 28.77 kg/m    Physical Exam Vitals and nursing note reviewed.  Constitutional:      Appearance: She is well-developed.  HENT:     Head: Normocephalic and atraumatic.  Eyes:     Conjunctiva/sclera: Conjunctivae normal.  Pulmonary:     Effort: Pulmonary effort is normal.  Abdominal:     Palpations: Abdomen is soft.  Musculoskeletal:     Cervical back: Normal range of motion.  Skin:    General: Skin is warm and dry.     Capillary Refill: Capillary refill takes less than 2 seconds.  Neurological:     Mental Status: She is alert and oriented to person, place, and time.  Psychiatric:        Behavior: Behavior normal.      Musculoskeletal Exam: C-spine good ROM.  Postural thoracic kyphosis noted.  Painful ROM of lumbar spine.  Midline spinal tenderness in the lumbar region.  No SI joint tenderness.  Full strength of upper extremities noted.  Shoulder joints, elbow joints,  wrist joints, MPCs, PIPs, and DIPs good ROM with no synovitis.  PIP and DIP thickening consistent with osteoarthritis of both hands.  Tenderness of both 2nd and 3rd PIP joints.  Complete fist formation bilaterally.  Hip joints good ROM with no discomfort.  Tenderness over both trochanteric bursa.  Knee joints good ROM with no warmth or effusion.  No calf tenderness.  Ankle joints good ROM with no tenderness or inflammation.   CDAI Exam: CDAI Score: -- Patient Global: --; Provider Global: -- Swollen: --; Tender: -- Joint Exam 11/22/2019   No joint exam has been documented for this visit   There is currently no information documented on the homunculus. Go to the Rheumatology activity and complete the homunculus joint exam.  Investigation: No additional findings.  Imaging: CT Chest Wo Contrast  Result Date:  10/26/2019 CLINICAL DATA:  74 year old female with follow-up chest CT for mildly enlarged right hilar and subcarinal lymph nodes seen on the prior CT. EXAM: CT CHEST WITHOUT CONTRAST TECHNIQUE: Multidetector CT imaging of the chest was performed following the standard protocol without IV contrast. COMPARISON:  Chest CT dated 07/22/2019. FINDINGS: Evaluation of this exam is limited in the absence of intravenous contrast. Cardiovascular: There is no cardiomegaly or pericardial effusion. Mild atherosclerotic calcification of the thoracic aorta. No aneurysmal dilatation. There is mild dilatation of the main pulmonary trunk suggestive of a degree of pulmonary hypertension. Mediastinum/Nodes: No definite hilar or mediastinal adenopathy. The previously seen soft tissue thickening in the subcarinal region has resolved. The esophagus and the thyroid gland are grossly unremarkable. No mediastinal fluid collection. Lungs/Pleura: Interval resolution of the previously seen bilateral pleural effusions. There is no focal consolidation, pleural effusion, or pneumothorax. Focal area of scarring with associated calcification in the medial right middle lobe. Several scattered subpleural nodules or granuloma measure up to 3 mm. The central airways are patent. Upper Abdomen: Indeterminate 1 cm left renal hypodense lesion, likely a cyst. Musculoskeletal: Right mastectomy. Several surgical clips in the right axilla. No acute osseous pathology. IMPRESSION: 1. No acute intrathoracic pathology. 2. Interval resolution of the previously seen bilateral pleural effusions and mediastinal adenopathy. 3. Aortic Atherosclerosis (ICD10-I70.0). Electronically Signed   By: Anner Crete M.D.   On: 10/26/2019 22:53   XR Lumbar Spine 2-3 Views  Result Date: 11/22/2019 Multilevel spondylosis was noted.  Significant narrowing was noted between L4-5 and L5-S1.  Facet joint arthropathy was noted.  No SI joint sclerosis was noted. Impression: These  findings are consistent with multilevel spondylosis and facet joint arthropathy.   Recent Labs: Lab Results  Component Value Date   WBC 3.1 (L) 11/07/2019   HGB 12.9 11/07/2019   PLT 228 11/07/2019   NA 141 11/07/2019   K 4.4 11/07/2019   CL 105 11/07/2019   CO2 28 11/07/2019   GLUCOSE 88 11/07/2019   BUN 14 11/07/2019   CREATININE 0.92 11/07/2019   BILITOT 0.5 11/07/2019   ALKPHOS 53 07/25/2019   AST 17 11/07/2019   ALT 16 11/07/2019   PROT 7.1 11/07/2019   ALBUMIN 3.0 (L) 07/25/2019   CALCIUM 9.5 11/07/2019   GFRAA 72 11/07/2019   QFTBGOLDPLUS Negative 07/25/2019    Speciality Comments: PLQ EYe Exam: 08/22/2019 WNL @ [facility] [place] up in 1 year  Procedures:  No procedures performed Allergies: Levofloxacin, Nitrofurantoin, Penicillins, Sulfonamide derivatives, and Pantoprazole   Assessment / Plan:     Visit Diagnoses: Autoimmune disease ([place]) - ANA+, sicca symptoms: She has not had any signs or symptoms of  a systemic autoimmune disease flare.  She is clinically doing well on Plaquenil 200 mg 1 tablet by mouth daily Monday through Friday.  She has not had any recent rashes, increased photosensitivity, symptoms of Raynaud's, oral or nasal ulcerations, chest pain, palpitations, increased shortness of breath, or enlarged lymph nodes.  She continues to have chronic sicca symptoms and the use of over-the-counter products were discussed.  She has no synovitis on exam today.  No cervical lymphadenopathy was palpable.  She has noticed some increased hair thinning over the past several months and was encouraged to try taking biotin daily.  We also discussed the importance of avoiding direct sun exposure and to wear sunscreen SPF >50 on daily basis.    Autoimmune lab work from 06/29/2018 was reviewed with the patient today in the office: dsDNA negative, complements WNL, and ESR 41.  Future orders for these labs were placed today to be drawn with upcoming repeat CBC (WBC count was 3.1 on  11/07/19).  She will continue taking Plaquenil as prescribed.  She does not need any refills at this time.  She was advised to notify us if she develops any new or worsening symptoms.  She will follow-up in the office in 5 months.- Plan: Anti-DNA antibody, double-stranded, C3 and C4, Sedimentation rate  High risk medication use - Plaquenil 200 mg 1 tablet by mouth daily Monday to Friday. PLQ Eye Exam: 08/22/2019.  CBC and CMP were updated on 11/07/19.  WBC count was 3.1 at that time.  She has a history of leukopenia.  She will return at the end of November to recheck CBC.   She was diagnosed with pneumonia in July and was hospitalized for several days.  She has not had any other recent infections. She has received both covid-19 vaccine doses.   Sicca syndrome ([place]): She has chronic sicca symptoms and uses OTC products for symptomatic relief.    PMR (polymyalgia rheumatica) (HCC) - In remission.  She has full strength of upper and lower extremities on exam.  She has no difficulty raising her arms or rising from a seated position at this time.  She was advised to notify us if she develops signs or symptoms of a flare.    Chronic midline low back pain with right-sided sciatica - She presents today with increased discomfort in her lower back.  She has not had any recent injuries or falls prior to the onset of back pain.  She has been experiencing intermittent symptoms of right sided radiculopathy down the anterior aspect of her right thigh.  She has midline spinal tenderness in the lower lumbar region.  No SI joint tenderness noted.  X-rays of the lumbar spine were updated today which revealed multilevel spondylosis, significant narrowing was noted between L4-5 and L5-S1, and facet joint arthropathy. Declined MRI of lumbar spine at this time. She declined a referral to physical therapy at this time. She was advised to notify us if she changes her mind.  She was given a handout of back and core strengthening and  stretching exercises to perform at home.  Plan: XR Lumbar Spine 2-3 Views  Paresthesia of both hands - Resolved.  Other fatigue: Stable.  We discussed the importance of exercising on a daily basis.  She was encouraged to work up to 30 minutes daily.   Trochanteric bursitis of both hips: She has tenderness to palpation over bilateral trochanteric bursa.  She was encouraged to perform stretching exercises daily.   Osteopenia of multiple sites -  Followed by Dr. Quay Burow.  DEXA ordered by Dr. Quay Burow but she has not scheduled it yet.   Other medical conditions are listed as follows:   S/P right mastectomy  History of gastroesophageal reflux (GERD)  History of peripheral neuropathy  History of cancer of right breast  History of hyperlipidemia  History of hypertension    Orders: Orders Placed This Encounter  Procedures  . XR Lumbar Spine 2-3 Views  . Anti-DNA antibody, double-stranded  . C3 and C4  . Sedimentation rate   No orders of the defined types were placed in this encounter.   Follow-Up Instructions: Return in about 5 months (around 04/21/2020) for Autoimmune Disease, Polymyalgia Rheumatica.   Ofilia Neas, PA-C  Note - This record has been created using Dragon software.  Chart creation errors have been sought, but may not always  have been located. Such creation errors do not reflect on  the standard of medical care.

## 2019-11-08 NOTE — Progress Notes (Signed)
CMP is normal, WBC is low.Please repeat CBC in 1 month.

## 2019-11-10 ENCOUNTER — Telehealth: Payer: Self-pay

## 2019-11-10 NOTE — Telephone Encounter (Signed)
Patient called requesting a return call to discuss her low white blood count.

## 2019-11-10 NOTE — Telephone Encounter (Signed)
Spoke with patient. She asked if there was anything she can do to increase her WBC. Patient advised there is not a medication we can give her but we are monitoring it more closely. Patient reminded to come back to the office in 1 month to recheck her CBC. Patient expressed understanding.

## 2019-11-22 ENCOUNTER — Encounter: Payer: Self-pay | Admitting: Physician Assistant

## 2019-11-22 ENCOUNTER — Ambulatory Visit: Payer: Self-pay

## 2019-11-22 ENCOUNTER — Other Ambulatory Visit: Payer: Self-pay

## 2019-11-22 ENCOUNTER — Ambulatory Visit: Payer: PPO | Admitting: Physician Assistant

## 2019-11-22 VITALS — BP 126/77 | HR 56 | Resp 16 | Ht 69.0 in | Wt 194.8 lb

## 2019-11-22 DIAGNOSIS — M5441 Lumbago with sciatica, right side: Secondary | ICD-10-CM | POA: Diagnosis not present

## 2019-11-22 DIAGNOSIS — M3500 Sicca syndrome, unspecified: Secondary | ICD-10-CM | POA: Diagnosis not present

## 2019-11-22 DIAGNOSIS — G8929 Other chronic pain: Secondary | ICD-10-CM

## 2019-11-22 DIAGNOSIS — M7061 Trochanteric bursitis, right hip: Secondary | ICD-10-CM

## 2019-11-22 DIAGNOSIS — M8589 Other specified disorders of bone density and structure, multiple sites: Secondary | ICD-10-CM

## 2019-11-22 DIAGNOSIS — M353 Polymyalgia rheumatica: Secondary | ICD-10-CM

## 2019-11-22 DIAGNOSIS — Z79899 Other long term (current) drug therapy: Secondary | ICD-10-CM

## 2019-11-22 DIAGNOSIS — M359 Systemic involvement of connective tissue, unspecified: Secondary | ICD-10-CM

## 2019-11-22 DIAGNOSIS — Z853 Personal history of malignant neoplasm of breast: Secondary | ICD-10-CM | POA: Diagnosis not present

## 2019-11-22 DIAGNOSIS — Z8719 Personal history of other diseases of the digestive system: Secondary | ICD-10-CM | POA: Diagnosis not present

## 2019-11-22 DIAGNOSIS — M7062 Trochanteric bursitis, left hip: Secondary | ICD-10-CM

## 2019-11-22 DIAGNOSIS — R5383 Other fatigue: Secondary | ICD-10-CM | POA: Diagnosis not present

## 2019-11-22 DIAGNOSIS — Z9011 Acquired absence of right breast and nipple: Secondary | ICD-10-CM | POA: Diagnosis not present

## 2019-11-22 DIAGNOSIS — Z8639 Personal history of other endocrine, nutritional and metabolic disease: Secondary | ICD-10-CM

## 2019-11-22 DIAGNOSIS — Z8679 Personal history of other diseases of the circulatory system: Secondary | ICD-10-CM

## 2019-11-22 DIAGNOSIS — R202 Paresthesia of skin: Secondary | ICD-10-CM

## 2019-11-22 DIAGNOSIS — Z8669 Personal history of other diseases of the nervous system and sense organs: Secondary | ICD-10-CM | POA: Diagnosis not present

## 2019-11-22 NOTE — Patient Instructions (Addendum)
Standing Labs We placed an order today for your standing lab work.   Please have your standing labs drawn at the end of November   If possible, please have your labs drawn 2 weeks prior to your appointment so that the provider can discuss your results at your appointment.  We have open lab daily Monday through Thursday from 8:30-12:30 PM and 1:30-4:30 PM and Friday from 8:30-12:30 PM and 1:30-4:00 PM at the office of Dr. Bo Merino, [facility].   Please be advised, patients with office appointments requiring lab work will take precedents over walk-in lab work.  If possible, please come for your lab work on Monday and Friday afternoons, as you may experience shorter wait times. The office is located at [address], [place], [place], [place] [address] No appointment is necessary.   Labs are drawn by [facility]. Please bring your co-pay at the time of your lab draw.  You may receive a bill from [facility] for your lab work.  If you wish to have your labs drawn at another location, please call the office 24 hours in advance to send orders.  If you have any questions regarding directions or hours of operation,  please call [phone].   As a reminder, please drink plenty of water prior to coming for your lab work. Thanks!   Core Strength Exercises  Core exercises help to build strength in the muscles between your ribs and your hips (abdominal muscles). These muscles help to support your body and keep your spine stable. It is important to maintain strength in your core to prevent injury and pain. Some activities, such as yoga and Pilates, can help to strengthen core muscles. You can also strengthen core muscles with exercises at home. It is important to talk to your health care provider before you start a new exercise routine. What are the benefits of core strength exercises? Core strength exercises can:  Reduce back pain.  Help to rebuild strength after a back or spine  injury.  Help to prevent injury during physical activity, especially injuries to the back and knees. How to do core strength exercises Repeat these exercises 10-15 times, or until you are tired. Do exercises exactly as told by your health care provider and adjust them as directed. It is normal to feel mild stretching, pulling, tightness, or discomfort as you do these exercises. If you feel any pain while doing these exercises, stop. If your pain continues or gets worse when doing core exercises, contact your health care provider. You may want to use a padded yoga or exercise mat for strength exercises that are done on the floor. Bridging  1. Lie on your back on a firm surface with your knees bent and your feet flat on the floor. 2. Raise your hips so that your knees, hips, and shoulders form a straight line together. Keep your abdominal muscles tight. 3. Hold this position for 3-5 seconds. 4. Slowly lower your hips to the starting position. 5. Let your muscles relax completely between repetitions. Single-leg bridge 1. Lie on your back on a firm surface with your knees bent and your feet flat on the floor. 2. Raise your hips so that your knees, hips, and shoulders form a straight line together. Keep your abdominal muscles tight. 3. Lift one foot off the floor, then completely straighten that leg. 4. Hold this position for 3-5 seconds. 5. Put the straight leg back down in the bent position. 6. Slowly lower your hips to the  starting position. 7. Repeat these steps using your other leg. Side bridge 1. Lie on your side with your knees bent. Prop yourself up on the elbow that is near the floor. 2. Using your abdominal muscles and your elbow that is on the floor, raise your body off the floor. Raise your hip so that your shoulder, hip, and foot form a straight line together. 3. Hold this position for 10 seconds. Keep your head and neck raised and away from your shoulder (in their normal, neutral  position). Keep your abdominal muscles tight. 4. Slowly lower your hip to the starting position. 5. Repeat and try to hold this position longer, working your way up to 30 seconds. Abdominal crunch 1. Lie on your back on a firm surface. Bend your knees and keep your feet flat on the floor. 2. Cross your arms over your chest. 3. Without bending your neck, tip your chin slightly toward your chest. 4. Tighten your abdominal muscles as you lift your chest just high enough to lift your shoulder blades off of the floor. Do not hold your breath. You can do this with short lifts or long lifts. 5. Slowly return to the starting position. Bird dog 1. Get on your hands and knees, with your legs shoulder-width apart and your arms under your shoulders. Keep your back straight. 2. Tighten your abdominal muscles. 3. Raise one of your legs off the floor and straighten it. Try to keep it parallel to the floor. 4. Slowly lower your leg to the starting position. 5. Raise one of your arms off the floor and straighten it. Try to keep it parallel to the floor. 6. Slowly lower your arm to the starting position. 7. Repeat with the other arm and leg. If possible, try raising a leg and arm at the same time, on opposite sides of the body. For example, raise your left hand and your right leg. Plank 1. Lie on your belly. 2. Prop up your body onto your forearms and your feet, keeping your legs straight. Your body should make a straight line between your shoulders and feet. 3. Hold this position for 10 seconds while keeping your abdominal muscles tight. 4. Lower your body to the starting position. 5. Repeat and try to hold this position longer, working your way up to 30 seconds. Cross-core strengthening 1. Stand with your feet shoulder-width apart. 2. Hold a ball out in front of you. Keep your arms straight. 3. Tighten your abdominal muscles and slowly rotate at your waist from side to side. Keep your feet flat. 4. Once  you are comfortable, try repeating this exercise with a heavier ball. Top core strengthening 1. Stand about 18 inches (46 cm) in front of a wall, with your back to the wall. 2. Keep your feet flat and shoulder-width apart. 3. Tighten your abdominal muscles. 4. Bend your hips and knees. 5. Slowly reach between your legs to touch the wall behind you. 6. Slowly stand back up. 7. Raise your arms over your head and reach behind you. 8. Return to the starting position. General tips  Do not do any exercises that cause pain. If you have pain while exercising, talk to your health care provider.  Always stretch before and after doing these exercises. This can help prevent injury.  Maintain a healthy weight. Ask your health care provider what weight is healthy for you. Contact a health care provider if:  You have back pain that gets worse or does not go away.  You feel pain while doing core strength exercises. Get help right away if:  You have severe pain that does not get better with medicine. Summary  Core exercises help to build strength in the muscles between your ribs and your waist.  Core muscles help to support your body and keep your spine stable.  Some activities, such as yoga and Pilates, can help to strengthen core muscles.  Core strength exercises can help back pain and can prevent injury.  If you feel any pain while doing core strength exercises, stop. This information is not intended to replace advice given to you by your health care provider. Make sure you discuss any questions you have with your health care provider. Document Revised: 04/27/2018 Document Reviewed: 05/27/2016 Elsevier Patient Education  [employer].  Back Exercises These exercises help to make your trunk and back strong. They also help to keep the lower back flexible. Doing these exercises can help to prevent back pain or lessen existing pain.  If you have back pain, try to do these exercises 2-3  times each day or as told by your doctor.  As you get better, do the exercises once each day. Repeat the exercises more often as told by your doctor.  To stop back pain from coming back, do the exercises once each day, or as told by your doctor. Exercises Single knee to chest Do these steps 3-5 times in a row for each leg: 1. Lie on your back on a firm bed or the floor with your legs stretched out. 2. Bring one knee to your chest. 3. Grab your knee or thigh with both hands and hold them it in place. 4. Pull on your knee until you feel a gentle stretch in your lower back or buttocks. 5. Keep doing the stretch for 10-30 seconds. 6. Slowly let go of your leg and straighten it. Pelvic tilt Do these steps 5-10 times in a row: 1. Lie on your back on a firm bed or the floor with your legs stretched out. 2. Bend your knees so they point up to the ceiling. Your feet should be flat on the floor. 3. Tighten your lower belly (abdomen) muscles to press your lower back against the floor. This will make your tailbone point up to the ceiling instead of pointing down to your feet or the floor. 4. Stay in this position for 5-10 seconds while you gently tighten your muscles and breathe evenly. Cat-cow Do these steps until your lower back bends more easily: 1. Get on your hands and knees on a firm surface. Keep your hands under your shoulders, and keep your knees under your hips. You may put padding under your knees. 2. Let your head hang down toward your chest. Tighten (contract) the muscles in your belly. Point your tailbone toward the floor so your lower back becomes rounded like the back of a cat. 3. Stay in this position for 5 seconds. 4. Slowly lift your head. Let the muscles of your belly relax. Point your tailbone up toward the ceiling so your back forms a sagging arch like the back of a cow. 5. Stay in this position for 5 seconds.  Press-ups Do these steps 5-10 times in a row: 1. Lie on your belly  (face-down) on the floor. 2. Place your hands near your head, about shoulder-width apart. 3. While you keep your back relaxed and keep your hips on the floor, slowly straighten your arms to raise the top half of your  body and lift your shoulders. Do not use your back muscles. You may change where you place your hands in order to make yourself more comfortable. 4. Stay in this position for 5 seconds. 5. Slowly return to lying flat on the floor.  Bridges Do these steps 10 times in a row: 1. Lie on your back on a firm surface. 2. Bend your knees so they point up to the ceiling. Your feet should be flat on the floor. Your arms should be flat at your sides, next to your body. 3. Tighten your butt muscles and lift your butt off the floor until your waist is almost as high as your knees. If you do not feel the muscles working in your butt and the back of your thighs, slide your feet 1-2 inches farther away from your butt. 4. Stay in this position for 3-5 seconds. 5. Slowly lower your butt to the floor, and let your butt muscles relax. If this exercise is too easy, try doing it with your arms crossed over your chest. Belly crunches Do these steps 5-10 times in a row: 1. Lie on your back on a firm bed or the floor with your legs stretched out. 2. Bend your knees so they point up to the ceiling. Your feet should be flat on the floor. 3. Cross your arms over your chest. 4. Tip your chin a little bit toward your chest but do not bend your neck. 5. Tighten your belly muscles and slowly raise your chest just enough to lift your shoulder blades a tiny bit off of the floor. Avoid raising your body higher than that, because it can put too much stress on your low back. 6. Slowly lower your chest and your head to the floor. Back lifts Do these steps 5-10 times in a row: 1. Lie on your belly (face-down) with your arms at your sides, and rest your forehead on the floor. 2. Tighten the muscles in your legs and  your butt. 3. Slowly lift your chest off of the floor while you keep your hips on the floor. Keep the back of your head in line with the curve in your back. Look at the floor while you do this. 4. Stay in this position for 3-5 seconds. 5. Slowly lower your chest and your face to the floor. Contact a doctor if:  Your back pain gets a lot worse when you do an exercise.  Your back pain does not get better 2 hours after you exercise. If you have any of these problems, stop doing the exercises. Do not do them again unless your doctor says it is okay. Get help right away if:  You have sudden, very bad back pain. If this happens, stop doing the exercises. Do not do them again unless your doctor says it is okay. This information is not intended to replace advice given to you by your health care provider. Make sure you discuss any questions you have with your health care provider. Document Revised: 09/30/2017 Document Reviewed: 09/30/2017 Elsevier Patient Education  2020 [employer].

## 2019-11-23 ENCOUNTER — Telehealth: Payer: Self-pay | Admitting: Pharmacist

## 2019-11-23 DIAGNOSIS — B351 Tinea unguium: Secondary | ICD-10-CM | POA: Diagnosis not present

## 2019-11-23 DIAGNOSIS — E1142 Type 2 diabetes mellitus with diabetic polyneuropathy: Secondary | ICD-10-CM | POA: Diagnosis not present

## 2019-11-23 DIAGNOSIS — M79676 Pain in unspecified toe(s): Secondary | ICD-10-CM | POA: Diagnosis not present

## 2019-11-23 DIAGNOSIS — L84 Corns and callosities: Secondary | ICD-10-CM | POA: Diagnosis not present

## 2019-11-23 NOTE — Progress Notes (Addendum)
Chronic Care Management Pharmacy Assistant   Name: Angela Simmons  MRN: [id number] DOB: [birth year]  Reason for Encounter: Disease State- Hypertension Adherence State Call   Patient Questions: 1.  Have you seen any other providers since your last visit?   09/04/19 Dr Harl Bowie (cardiology): stop carvedilol and amlodipine, start diltiazem 180 mg daily 09/07/19 Dr Quay Burow OV: increased diltiazem to 240 mg 09/26/19 Dr Harl Bowie (phone): decreased diltiazem back to 180 mg due to dizziness 10/16/19 Dr Harl Bowie (cardiology): add metoprolol 25 mg BID for ongoing palpitations 10/27/19 Dr Quay Burow OV: f/u visit, no med changes. 10/30/19 Dr Valeta Harms (pulmonary): f/u for mediastinal adenopathy, pleural effusion. No med changes. 11/22/19 PA Hazel Sams (rheumatology): f/u for autoimmune disease, PMR, no med changes.  2.  Any changes in your medicines or health?  -carvedilol and amlodipine were stopped, diltiazem 180 mg was started and metoprolol 25 mg BID was started  PCP : Binnie Rail, MD  Allergies:   Allergies  Allergen Reactions  . Levofloxacin Hives and Itching  . Nitrofurantoin Hives and Itching  . Penicillins Hives and Itching  . Sulfonamide Derivatives Itching  . Pantoprazole Other (See Comments)    Bad dreams    Medications: Outpatient Encounter Medications as of 11/23/2019  Medication Sig  . amLODipine (NORVASC) 5 MG tablet Take 5 mg by mouth daily.  Marland Kitchen apixaban (ELIQUIS) 5 MG TABS tablet Take 1 tablet (5 mg total) by mouth 2 (two) times daily.  Marland Kitchen atorvastatin (LIPITOR) 10 MG tablet TAKE 1 TABLET BY MOUTH DAILY  . benazepril (LOTENSIN) 40 MG tablet TAKE 1 TABLET BY MOUTH EVERY DAY  . Blood Glucose Monitoring Suppl (ONETOUCH VERIO) w/Device KIT USE TO TEST BLOOD SUGAR ONCE DAILY AS DIRECTED  . diclofenac sodium (VOLTAREN) 1 % GEL Apply 3 g to 3 large joints up to 3 times daily.  Marland Kitchen diltiazem (CARDIZEM CD) 180 MG 24 hr capsule Take 1 capsule (180 mg total) by mouth daily. (Patient not taking:  Reported on 11/22/2019)  . famotidine (PEPCID) 20 MG tablet Take 20 mg by mouth 2 (two) times daily.  . fluticasone (FLONASE) 50 MCG/ACT nasal spray USE 2 SPRAYS IN EACH NOSTRIL EVERY DAY  . Ginger, Zingiber officinalis, 550 MG CAPS Take 1 capsule (550 mg total) by mouth daily.  Marland Kitchen glucose blood test strip USE TO TEST BLOOD SUGAR ONCE DAILY. E11.40  . hydroxychloroquine (PLAQUENIL) 200 MG tablet TAKE 1 TABLET BY MOUTH ONCE DAILY ON MONDAY-FRIDAY  . loratadine (CLARITIN) 10 MG tablet TAKE 1 TABLET BY MOUTH EVERY DAY  . LOTEMAX 0.5 % GEL Place 1 drop into the left eye 3 (three) times daily.  . metoprolol tartrate (LOPRESSOR) 25 MG tablet Take 1 tablet (25 mg total) by mouth 2 (two) times daily.  . Multiple Vitamin (MULTIVITAMIN) tablet Take 1 tablet by mouth daily. (Patient not taking: Reported on 11/22/2019)  . Multiple Vitamins-Minerals (CENTRUM SILVER 50+WOMEN) TABS Take 1 tablet by mouth daily at 6 (six) AM.  . Omega-3 Fatty Acids (FISH OIL) 1000 MG CAPS Take 1 capsule by mouth daily.   Marland Kitchen omeprazole (PRILOSEC) 40 MG capsule TAKE ONE CAPSULE BY MOUTH DAILY  . ONETOUCH DELICA LANCETS 77O MISC USE TO TEST ONCE DAILY  . ONETOUCH VERIO test strip USE TO TEST BLOOD SUGAR ONCE DAILY  . triamcinolone ointment (KENALOG) 0.5 % Apply 1 application topically 2 (two) times daily. Do not use for more than 14 days in a row  . Turmeric (QC TUMERIC COMPLEX) 500 MG CAPS Take  1 capsule by mouth daily.   No facility-administered encounter medications on file as of 11/23/2019.    Current Diagnosis: Patient Active Problem List   Diagnosis Date Noted  . A-fib ([place]) 08/04/2019  . Thrush 08/02/2019  . Anemia 08/02/2019  . Community acquired pneumonia 07/22/2019  . Burning sensation 06/21/2019  . DOE (dyspnea on exertion) 04/03/2019  . Ankle swelling, left 04/03/2019  . Hyperpigmented skin lesion 04/03/2019  . Poor balance 12/06/2018  . Headache 12/06/2018  . Vertigo 06/07/2018  . Urinary frequency 06/07/2018   . Lower abdominal pain 06/07/2018  . Herpes zoster without complication 15/05/6977  . Tingling in extremities 11/26/2017  . Ischial bursitis of left side 06/02/2017  . Sicca syndrome ([place]) 06/23/2016  . Bilateral leg edema 04/07/2016  . Burning sensation of skin 04/07/2016  . PMR (polymyalgia rheumatica) ([facility]) 03/27/2016  . Osteopenia 03/04/2016  . Autoimmune disease ([place]) 03/18/2015  . Inflammatory arthritis 03/18/2015  . Joint pain 11/22/2014  . Overweight (BMI 25.0-29.9) 08/18/2014  . Low back pain with radiation 08/18/2014  . Hyperlipidemia 06/28/2014  . Chest pain 04/11/2014  . Tendonitis of shoulder 08/29/2013  . S/P right mastectomy 11/14/2012  . Type 2 diabetes, controlled, with neuropathy ([place]) 10/05/2012  . Allergic rhinitis 01/26/2011  . GERD 11/10/2009  . INTERSTITIAL CYSTITIS 10/31/2008  . BENIGN NEOPLASM OF VULVA 02/01/2008  . Essential hypertension, benign 02/08/2007  . History of cancer of right breast 02/08/2007   Reviewed chart prior to disease state call. Spoke with patient regarding BP  Recent Office Vitals: BP Readings from Last 3 Encounters:  11/22/19 126/77  10/30/19 136/70  10/27/19 (!) 150/82   Pulse Readings from Last 3 Encounters:  11/22/19 (!) 56  10/30/19 (!) 50  10/27/19 (!) 51    Wt Readings from Last 3 Encounters:  11/22/19 194 lb 12.8 oz (88.4 kg)  10/30/19 192 lb 9.6 oz (87.4 kg)  10/27/19 195 lb (88.5 kg)     Kidney Function Lab Results  Component Value Date/Time   CREATININE 0.92 11/07/2019 10:30 AM   CREATININE 0.82 08/23/2019 02:36 AM   CREATININE 1.14 (H) 08/02/2019 03:16 PM   GFR 75.21 06/21/2019 09:24 AM   GFRNONAA 62 11/07/2019 10:30 AM   GFRAA 72 11/07/2019 10:30 AM    BMP Latest Ref Rng & Units 11/07/2019 08/23/2019 08/02/2019  Glucose 65 - 99 mg/dL 88 112(H) 84  BUN 7 - 25 mg/dL 14 7(L) 13  Creatinine 0.60 - 0.93 mg/dL 0.92 0.82 1.14(H)  BUN/Creat Ratio 6 - 22 (calc) NOT APPLICABLE - 11  Sodium 135 - 146 mmol/L  141 139 141  Potassium 3.5 - 5.3 mmol/L 4.4 3.8 4.2  Chloride 98 - 110 mmol/L 105 103 106  CO2 20 - 32 mmol/L _0 Calcium 8.6 - 10.4 mg/dL 9.5 9.2 9.1    . Current antihypertensive regimen:  o Benazepril 40 mg daily o Diltiazem CD 180 mg daily o Metoprolol tartrate 25 mg BID  . How often are you checking your Blood Pressure? Patient checks her blood pressure 3 times a day  . Current home BP readings: .  11/20/19 136/69, 137/78, 129/72 . 11/21/19 131/72, 132/74 . 11/22/19 131/74, 138/85 . 11/23/19 124/70  . What recent interventions/DTPs have been made by any provider to improve Blood Pressure control since last CPP Visit: amlodipine and carvedilol were stopped, metoprolol and diltiazem were added  . Any recent hospitalizations or ED visits since last visit with CPP? Patient was in the hospital in July for  Pnuemonia  . What diet changes have been made to improve Blood Pressure Control?  Patient states that she is on a healthy heart diet, but sometimes doesn't follow it  . What exercise is being done to improve your Blood Pressure Control? Patient states that she does walking sometimes around the house trying to get a routine. States that she does have a treadmill but does not use it.   Adherence Review: Is the patient currently on ACE/ARB medication? Yes  Does the patient have >5 day gap between last estimated fill dates? Yes - benazepril was last filled 09/15/19 for 30 ds per dispense report   [name], CPA (Clinical Pharmacist Assistant)

## 2019-11-24 ENCOUNTER — Telehealth: Payer: PPO

## 2019-11-28 ENCOUNTER — Other Ambulatory Visit: Payer: Self-pay | Admitting: Physician Assistant

## 2019-11-28 NOTE — Telephone Encounter (Signed)
Last Visit: 11/22/2019 Next Visit: 04/23/2020 Labs: 11/07/2019 CMP is normal, WBC is low. Eye exam: 08/22/2019 WNL   Current Dose per office note 11/22/2019: Plaquenil 200 mg 1 tablet by mouth daily Monday to Friday LU:[id number] disease   Okay to refill Plaquenil?

## 2019-12-05 ENCOUNTER — Other Ambulatory Visit (HOSPITAL_COMMUNITY)
Admission: RE | Admit: 2019-12-05 | Discharge: 2019-12-05 | Disposition: A | Discharge status: Home / Self Care | Payer: PPO | Source: Ambulatory Visit | Attending: Cardiovascular Disease | Admitting: Cardiovascular Disease

## 2019-12-05 ENCOUNTER — Ambulatory Visit: Payer: PPO | Admitting: Cardiology

## 2019-12-05 ENCOUNTER — Other Ambulatory Visit: Payer: Self-pay

## 2019-12-05 ENCOUNTER — Encounter: Payer: Self-pay | Admitting: Cardiology

## 2019-12-05 VITALS — BP 138/86 | HR 60 | Ht 69.0 in | Wt 193.0 lb

## 2019-12-05 DIAGNOSIS — I10 Essential (primary) hypertension: Secondary | ICD-10-CM | POA: Diagnosis not present

## 2019-12-05 DIAGNOSIS — R079 Chest pain, unspecified: Secondary | ICD-10-CM | POA: Diagnosis not present

## 2019-12-05 DIAGNOSIS — Z7901 Long term (current) use of anticoagulants: Secondary | ICD-10-CM | POA: Insufficient documentation

## 2019-12-05 DIAGNOSIS — I480 Paroxysmal atrial fibrillation: Secondary | ICD-10-CM | POA: Diagnosis not present

## 2019-12-05 DIAGNOSIS — Z01812 Encounter for preprocedural laboratory examination: Secondary | ICD-10-CM | POA: Insufficient documentation

## 2019-12-05 DIAGNOSIS — E1140 Type 2 diabetes mellitus with diabetic neuropathy, unspecified: Secondary | ICD-10-CM

## 2019-12-05 DIAGNOSIS — Z20822 Contact with and (suspected) exposure to covid-19: Secondary | ICD-10-CM | POA: Insufficient documentation

## 2019-12-05 MED ORDER — NITROGLYCERIN 0.4 MG SL SUBL: 0.4000 mg | SUBLINGUAL_TABLET | SUBLINGUAL | 3 refills | Status: DC | PRN

## 2019-12-05 NOTE — Assessment & Plan Note (Addendum)
Reviewed with Dr Harl Bowie- symptoms concerning, will plan on diagnostic cath later this week.  Add Imdur 30 mg daily and NTG SL PRN.  Hold Eliquis for 3 doses pre cath.

## 2019-12-05 NOTE — Progress Notes (Signed)
Cardiology Office Note:    Date:  12/05/2019   ID:  Angela Simmons, DOB 07/24/1945, MRN [id number]  PCP:  Binnie Rail, MD  Cardiologist:  Carlyle Dolly, MD  Electrophysiologist:  None   Referring MD: Binnie Rail, MD   CC:  Chest pain  History of Present Illness:    Angela Simmons is a pleasant 74 y.o. AA female with a hx of PAF, she is on Eliquis and in sinus rhythm, treated hypertension, dyslipidemia, and noninsulin-dependent diabetes.  The patient did have a low risk nuclear stress test in May 2021.  She is in the office today complaining of midsternal chest pain.  The patient tells me this morning she woke up and things were fine.  She was sitting at the table doing some bills when she is developed "soreness" in her mid to upper chest.  She felt like it radiated up into her gums.  She also says when she walks she gets similar symptoms.  This is similar to her presentation in May 2021 when she had her stress test.  She denies any associated tachycardia.  She denies any nausea vomiting or diaphoresis.  She has had no radiation to her arms.  Currently she is pain-free in the office.  She says her symptoms can come and go and last about 5 minutes.  Past Medical History:  Diagnosis Date  . Breast cancer ([place]) 1990s   right breast  . Collagen vascular disease ([place])   . Diabetes mellitus without complication ([place])   . GERD (gastroesophageal reflux disease)   . HTN (hypertension)   . Hypercholesterolemia   . Obesity   . Osteoarthritis     Past Surgical History:  Procedure Laterality Date  . ABDOMIN[place]  [place]  . COLONOSCOPY  May 2003   Dr. Tamala Julian: normal  . COLONOSCOPY  06/22/2011   Procedure: COLONOSCOPY;  Surgeon: Danie Binder, MD;  [facility];  Service: Endoscopy;  Laterality: N/A;  10:30  . MASTECTOMY  1995   right     Current Medications: Current Meds  Medication Sig  . apixaban (ELIQUIS) 5 MG TABS tablet Take 1 tablet (5  mg total) by mouth 2 (two) times daily.  Marland Kitchen atorvastatin (LIPITOR) 10 MG tablet TAKE 1 TABLET BY MOUTH DAILY  . benazepril (LOTENSIN) 40 MG tablet TAKE 1 TABLET BY MOUTH EVERY DAY  . Blood Glucose Monitoring Suppl (ONETOUCH VERIO) w/Device KIT USE TO TEST BLOOD SUGAR ONCE DAILY AS DIRECTED  . diclofenac sodium (VOLTAREN) 1 % GEL Apply 3 g to 3 large joints up to 3 times daily.  Marland Kitchen diltiazem (CARDIZEM CD) 180 MG 24 hr capsule Take 1 capsule (180 mg total) by mouth daily.  . fluticasone (FLONASE) 50 MCG/ACT nasal spray USE 2 SPRAYS IN EACH NOSTRIL EVERY DAY  . Ginger, Zingiber officinalis, 550 MG CAPS Take 1 capsule (550 mg total) by mouth daily.  Marland Kitchen glucose blood test strip USE TO TEST BLOOD SUGAR ONCE DAILY. E11.40  . hydroxychloroquine (PLAQUENIL) 200 MG tablet TAKE 1 TABLET BY MOUTH ONCE DAILY ON MONDAY-FRIDAY  . loratadine (CLARITIN) 10 MG tablet TAKE 1 TABLET BY MOUTH EVERY DAY  . LOTEMAX 0.5 % GEL Place 1 drop into the left eye 3 (three) times daily.  . metoprolol tartrate (LOPRESSOR) 25 MG tablet Take 1 tablet (25 mg total) by mouth 2 (two) times daily.  . Multiple Vitamin (MULTIVITAMIN) tablet Take 1 tablet by mouth daily.   Marland Kitchen  Multiple Vitamins-Minerals (CENTRUM SILVER 50+WOMEN) TABS Take 1 tablet by mouth daily at 6 (six) AM.  . Omega-3 Fatty Acids (FISH OIL) 1000 MG CAPS Take 1 capsule by mouth daily.   Marland Kitchen omeprazole (PRILOSEC) 40 MG capsule TAKE ONE CAPSULE BY MOUTH DAILY  . ONETOUCH DELICA LANCETS 57S MISC USE TO TEST ONCE DAILY  . ONETOUCH VERIO test strip USE TO TEST BLOOD SUGAR ONCE DAILY  . triamcinolone ointment (KENALOG) 0.5 % Apply 1 application topically 2 (two) times daily. Do not use for more than 14 days in a row  . Turmeric (QC TUMERIC COMPLEX) 500 MG CAPS Take 1 capsule by mouth daily.     Allergies:   Levofloxacin, Nitrofurantoin, Penicillins, Sulfonamide derivatives, and Pantoprazole   Social History   Socioeconomic History  . Marital status: Married    Spouse name:  Not on file  . Number of children: 3  . Years of education: 7  . Highest education level: Not on file  Occupational History  . Not on file  Tobacco Use  . Smoking status: Never Smoker  . Smokeless tobacco: Never Used  Vaping Use  . Vaping Use: Never used  Substance and Sexual Activity  . Alcohol use: No  . Drug use: Never  . Sexual activity: Never    Birth control/protection: Rhythm  Other Topics Concern  . Not on file  Social History Narrative   Lives at home with husband   Caffeine, maybe 1 a day   Social Determinants of Health   Financial Resource Strain: Low Risk   . Difficulty of Paying Living Expenses: Not very hard  Food Insecurity:   . Worried About Charity fundraiser in the Last Year: Not on file  . Ran Out of Food in the Last Year: Not on file  Transportation Needs:   . Lack of Transportation (Medical): Not on file  . Lack of Transportation (Non-Medical): Not on file  Physical Activity:   . Days of Exercise per Week: Not on file  . Minutes of Exercise per Session: Not on file  Stress:   . Feeling of Stress : Not on file  Social Connections:   . Frequency of Communication with Friends and Family: Not on file  . Frequency of Social Gatherings with Friends and Family: Not on file  . Attends Religious Services: Not on file  . Active Member of Clubs or Organizations: Not on file  . Attends Archivist Meetings: Not on file  . Marital Status: Not on file     Family History: The patient's family history includes Diabetes in her brother, brother, father, mother, sister, and sister; Emphysema in her father; Heart failure in her mother; Hypercholesterolemia in her brother, brother, sister, sister, sister, and sister; Hypertension in her brother, brother, and sister. There is no history of Colon cancer.  ROS:   Please see the history of present illness.     All other systems reviewed and are negative.  EKGs/Labs/Other Studies Reviewed:    The following  studies were reviewed today: Myoview 05/26/2019-  No diagnostic ST segment changes to indicate ischemia.  No significant myocardial perfusion defects to indicate scar or ischemia. There is apical breast attenuation artifact  This is a low risk study.  Nuclear stress EF: 72%.  Echo 05/11/2019- IMPRESSIONS    1. Left ventricular ejection fraction, by estimation, is 60 to 65%. The  left ventricle has normal function. The left ventricle has no regional  wall motion abnormalities. There is mild  left ventricular hypertrophy.  Left ventricular diastolic parameters  were normal.  2. Right ventricular systolic function is normal. The right ventricular  size is normal. There is mildly elevated pulmonary artery systolic  pressure.  3. The mitral valve is normal in structure. Trivial mitral valve  regurgitation. No evidence of mitral stenosis.  4. The aortic valve is tricuspid. Aortic valve regurgitation is not  visualized. No aortic stenosis is present.  5. The inferior vena cava is normal in size with greater than 50%  respiratory variability, suggesting right atrial pressure of 3 mmHg.   Comparison(s): Stress Echocardiogram done 11/03/15 showed an EF of 60%.   EKG:  EKG is ordered today.  The ekg ordered today demonstrates NSR, HR 60  Recent Labs: 07/22/2019: B Natriuretic Peptide 74.3 07/25/2019: Magnesium 2.2 11/07/2019: ALT 16; BUN 14; Creat 0.92; Hemoglobin 12.9; Platelets 228; Potassium 4.4; Sodium 141  Recent Lipid Panel    Component Value Date/Time   CHOL 142 06/21/2019 0924   TRIG 38.0 06/21/2019 0924   HDL 51.40 06/21/2019 0924   CHOLHDL 3 06/21/2019 0924   VLDL 7.6 06/21/2019 0924   LDLCALC 83 06/21/2019 0924    Physical Exam:    VS:  BP 138/86   Pulse 60   Ht 5' 9" (1.753 m)   Wt 193 lb (87.5 kg)   SpO2 98%   BMI 28.50 kg/m     Wt Readings from Last 3 Encounters:  12/05/19 193 lb (87.5 kg)  11/22/19 194 lb 12.8 oz (88.4 kg)  10/30/19 192 lb 9.6 oz (87.4  kg)     GEN: Well nourished, well developed AA female, in no acute distress HEENT: Normal NECK: No JVD; No carotid bruits CARDIAC: RRR, no murmurs, rubs, gallops RESPIRATORY:  Clear to auscultation without rales, wheezing or rhonchi  ABDOMEN: Soft, non-tender, non-distended MUSCULOSKELETAL:  No edema; No deformity  SKIN: Warm and dry NEUROLOGIC:  Alert and oriented x 3 PSYCHIATRIC:  Normal affect   ASSESSMENT:    Chest pain with moderate risk of acute coronary syndrome Reviewed with Dr Harl Bowie- symptoms concerning, will plan on diagnostic cath later this week.  Add NTG SL PRN.  Hold Eliquis for 3 doses pre cath.  PAF (paroxysmal atrial fibrillation) (HCC) NSR today in the office, no recent PAF by history  Anticoagulated CHADS VASC = 4, on Eliquis  Essential hypertension, benign Controlled  Type 2 diabetes, controlled, with neuropathy ([place]) Followed by PCP  PLAN:    Cath as noted later this week. Hold Eliquis. The patient understands that risks included but are not limited to stroke (1 in 1000), death (1 in 45), kidney failure [usually temporary] (1 in 500), bleeding (1 in 200), allergic reaction [possibly serious] (1 in 200).  The patient understands and agrees to proceed.    Medication Adjustments/Labs and Tests Ordered: Current medicines are reviewed at length with the patient today.  Concerns regarding medicines are outlined above.  Orders Placed This Encounter  Procedures  . EKG 12-Lead   No orders of the defined types were placed in this encounter.   There are no Patient Instructions on file for this visit.   Signed, Kerin Ransom, PA-C  12/05/2019 2:13 PM    [facility]

## 2019-12-05 NOTE — Patient Instructions (Signed)
Medication Instructions:  Your physician recommends that you continue on your current medications as directed. Please refer to the Current Medication list given to you today.  *If you need a refill on your cardiac medications before your next appointment, please call your pharmacy*   Lab Work: None today If you have labs (blood work) drawn today and your tests are completely normal, you will receive your results only by: Marland Kitchen MyChart Message (if you have MyChart) OR . A paper copy in the mail If you have any lab test that is abnormal or we need to change your treatment, we will call you to review the results.   Testing/Procedures: None today   Follow-Up: At [facility], you and your health needs are our priority.  As part of our continuing mission to provide you with exceptional heart care, we have created designated Provider Care Teams.  These Care Teams include your primary Cardiologist (physician) and Advanced Practice Providers (APPs -  Physician Assistants and Nurse Practitioners) who all work together to provide you with the care you need, when you need it.  We recommend signing up for the patient portal called "MyChart".  Sign up information is provided on this After Visit Summary.  MyChart is used to connect with patients for Virtual Visits (Telemedicine).  Patients are able to view lab/test results, encounter notes, upcoming appointments, etc.  Non-urgent messages can be sent to your provider as well.   To learn more about what you can do with MyChart, go to [email or web address].    Your next appointment:   1 month(s)  The format for your next appointment:   In Person  Provider:   Carlyle Dolly, MD   Other Instructions See heart cath instructions     Thank you for choosing [facility] !

## 2019-12-05 NOTE — Assessment & Plan Note (Signed)
NSR today in the office, no recent PAF by history

## 2019-12-05 NOTE — H&P (View-Only) (Signed)
Cardiology Office Note:    Date:  12/05/2019   ID:  [name], DOB 07/24/1945, MRN [id number]  PCP:  Binnie Rail, MD  Cardiologist:  Carlyle Dolly, MD  Electrophysiologist:  None   Referring MD: Binnie Rail, MD   CC:  Chest pain  History of Present Illness:    Angela Simmons is a pleasant 74 y.o. AA female with a hx of PAF, she is on Eliquis and in sinus rhythm, treated hypertension, dyslipidemia, and noninsulin-dependent diabetes.  The patient did have a low risk nuclear stress test in May 2021.  She is in the office today complaining of midsternal chest pain.  The patient tells me this morning she woke up and things were fine.  She was sitting at the table doing some bills when she is developed "soreness" in her mid to upper chest.  She felt like it radiated up into her gums.  She also says when she walks she gets similar symptoms.  This is similar to her presentation in May 2021 when she had her stress test.  She denies any associated tachycardia.  She denies any nausea vomiting or diaphoresis.  She has had no radiation to her arms.  Currently she is pain-free in the office.  She says her symptoms can come and go and last about 5 minutes.  Past Medical History:  Diagnosis Date  . Breast cancer ([place]) 1990s   right breast  . Collagen vascular disease ([place])   . Diabetes mellitus without complication ([place])   . GERD (gastroesophageal reflux disease)   . HTN (hypertension)   . Hypercholesterolemia   . Obesity   . Osteoarthritis     Past Surgical History:  Procedure Laterality Date  . ABDOMINAL HY[place]CTOMY  [place]   [place], [place]  . COLONOSCOPY  May 2003   Dr. Tamala Julian: normal  . COLONOSCOPY  06/22/2011   Procedure: COLONOSCOPY;  Surgeon: Danie Binder, MD;  [facility];  Service: Endoscopy;  Laterality: N/A;  10:30  . MASTECTOMY  1995   right     Current Medications: Current Meds  Medication Sig  . apixaban (ELIQUIS) 5 MG TABS tablet Take 1 tablet (5  mg total) by mouth 2 (two) times daily.  Marland Kitchen atorvastatin (LIPITOR) 10 MG tablet TAKE 1 TABLET BY MOUTH DAILY  . benazepril (LOTENSIN) 40 MG tablet TAKE 1 TABLET BY MOUTH EVERY DAY  . Blood Glucose Monitoring Suppl (ONETOUCH VERIO) w/Device KIT USE TO TEST BLOOD SUGAR ONCE DAILY AS DIRECTED  . diclofenac sodium (VOLTAREN) 1 % GEL Apply 3 g to 3 large joints up to 3 times daily.  Marland Kitchen diltiazem (CARDIZEM CD) 180 MG 24 hr capsule Take 1 capsule (180 mg total) by mouth daily.  . fluticasone (FLONASE) 50 MCG/ACT nasal spray USE 2 SPRAYS IN EACH NOSTRIL EVERY DAY  . Ginger, Zingiber officinalis, 550 MG CAPS Take 1 capsule (550 mg total) by mouth daily.  Marland Kitchen glucose blood test strip USE TO TEST BLOOD SUGAR ONCE DAILY. E11.40  . hydroxychloroquine (PLAQUENIL) 200 MG tablet TAKE 1 TABLET BY MOUTH ONCE DAILY ON MONDAY-FRIDAY  . loratadine (CLARITIN) 10 MG tablet TAKE 1 TABLET BY MOUTH EVERY DAY  . LOTEMAX 0.5 % GEL Place 1 drop into the left eye 3 (three) times daily.  . metoprolol tartrate (LOPRESSOR) 25 MG tablet Take 1 tablet (25 mg total) by mouth 2 (two) times daily.  . Multiple Vitamin (MULTIVITAMIN) tablet Take 1 tablet by mouth daily.   Marland Kitchen  Multiple Vitamins-Minerals (CENTRUM SILVER 50+WOMEN) TABS Take 1 tablet by mouth daily at 6 (six) AM.  . Omega-3 Fatty Acids (FISH OIL) 1000 MG CAPS Take 1 capsule by mouth daily.   Marland Kitchen omeprazole (PRILOSEC) 40 MG capsule TAKE ONE CAPSULE BY MOUTH DAILY  . ONETOUCH DELICA LANCETS 57S MISC USE TO TEST ONCE DAILY  . ONETOUCH VERIO test strip USE TO TEST BLOOD SUGAR ONCE DAILY  . triamcinolone ointment (KENALOG) 0.5 % Apply 1 application topically 2 (two) times daily. Do not use for more than 14 days in a row  . Turmeric (QC TUMERIC COMPLEX) 500 MG CAPS Take 1 capsule by mouth daily.     Allergies:   Levofloxacin, Nitrofurantoin, Penicillins, Sulfonamide derivatives, and Pantoprazole   Social History   Socioeconomic History  . Marital status: Married    Spouse name:  Not on file  . Number of children: 3  . Years of education: 7  . Highest education level: Not on file  Occupational History  . Not on file  Tobacco Use  . Smoking status: Never Smoker  . Smokeless tobacco: Never Used  Vaping Use  . Vaping Use: Never used  Substance and Sexual Activity  . Alcohol use: No  . Drug use: Never  . Sexual activity: Never    Birth control/protection: Rhythm  Other Topics Concern  . Not on file  Social History Narrative   Lives at home with husband   Caffeine, maybe 1 a day   Social Determinants of Health   Financial Resource Strain: Low Risk   . Difficulty of Paying Living Expenses: Not very hard  Food Insecurity:   . Worried About Charity fundraiser in the Last Year: Not on file  . Ran Out of Food in the Last Year: Not on file  Transportation Needs:   . Lack of Transportation (Medical): Not on file  . Lack of Transportation (Non-Medical): Not on file  Physical Activity:   . Days of Exercise per Week: Not on file  . Minutes of Exercise per Session: Not on file  Stress:   . Feeling of Stress : Not on file  Social Connections:   . Frequency of Communication with Friends and Family: Not on file  . Frequency of Social Gatherings with Friends and Family: Not on file  . Attends Religious Services: Not on file  . Active Member of Clubs or Organizations: Not on file  . Attends Archivist Meetings: Not on file  . Marital Status: Not on file     Family History: The patient's family history includes Diabetes in her brother, brother, father, mother, sister, and sister; Emphysema in her father; Heart failure in her mother; Hypercholesterolemia in her brother, brother, sister, sister, sister, and sister; Hypertension in her brother, brother, and sister. There is no history of Colon cancer.  ROS:   Please see the history of present illness.     All other systems reviewed and are negative.  EKGs/Labs/Other Studies Reviewed:    The following  studies were reviewed today: Myoview 05/26/2019-  No diagnostic ST segment changes to indicate ischemia.  No significant myocardial perfusion defects to indicate scar or ischemia. There is apical breast attenuation artifact  This is a low risk study.  Nuclear stress EF: 72%.  Echo 05/11/2019- IMPRESSIONS    1. Left ventricular ejection fraction, by estimation, is 60 to 65%. The  left ventricle has normal function. The left ventricle has no regional  wall motion abnormalities. There is mild  left ventricular hypertrophy.  Left ventricular diastolic parameters  were normal.  2. Right ventricular systolic function is normal. The right ventricular  size is normal. There is mildly elevated pulmonary artery systolic  pressure.  3. The mitral valve is normal in structure. Trivial mitral valve  regurgitation. No evidence of mitral stenosis.  4. The aortic valve is tricuspid. Aortic valve regurgitation is not  visualized. No aortic stenosis is present.  5. The inferior vena cava is normal in size with greater than 50%  respiratory variability, suggesting right atrial pressure of 3 mmHg.   Comparison(s): Stress Echocardiogram done 11/03/15 showed an EF of 60%.   EKG:  EKG is ordered today.  The ekg ordered today demonstrates NSR, HR 60  Recent Labs: 07/22/2019: B Natriuretic Peptide 74.3 07/25/2019: Magnesium 2.2 11/07/2019: ALT 16; BUN 14; Creat 0.92; Hemoglobin 12.9; Platelets 228; Potassium 4.4; Sodium 141  Recent Lipid Panel    Component Value Date/Time   CHOL 142 06/21/2019 0924   TRIG 38.0 06/21/2019 0924   HDL 51.40 06/21/2019 0924   CHOLHDL 3 06/21/2019 0924   VLDL 7.6 06/21/2019 0924   LDLCALC 83 06/21/2019 0924    Physical Exam:    VS:  BP 138/86   Pulse 60   Ht 5' 9" (1.753 m)   Wt 193 lb (87.5 kg)   SpO2 98%   BMI 28.50 kg/m     Wt Readings from Last 3 Encounters:  12/05/19 193 lb (87.5 kg)  11/22/19 194 lb 12.8 oz (88.4 kg)  10/30/19 192 lb 9.6 oz (87.4  kg)     GEN: Well nourished, well developed AA female, in no acute distress HEENT: Normal NECK: No JVD; No carotid bruits CARDIAC: RRR, no murmurs, rubs, gallops RESPIRATORY:  Clear to auscultation without rales, wheezing or rhonchi  ABDOMEN: Soft, non-tender, non-distended MUSCULOSKELETAL:  No edema; No deformity  SKIN: Warm and dry NEUROLOGIC:  Alert and oriented x 3 PSYCHIATRIC:  Normal affect   ASSESSMENT:    Chest pain with moderate risk of acute coronary syndrome Reviewed with Dr Harl Bowie- symptoms concerning, will plan on diagnostic cath later this week.  Add NTG SL PRN.  Hold Eliquis for 3 doses pre cath.  PAF (paroxysmal atrial fibrillation) (HCC) NSR today in the office, no recent PAF by history  Anticoagulated CHADS VASC = 4, on Eliquis  Essential hypertension, benign Controlled  Type 2 diabetes, controlled, with neuropathy ([place]) Followed by PCP  PLAN:    Cath as noted later this week. Hold Eliquis. The patient understands that risks included but are not limited to stroke (1 in 1000), death (1 in 45), kidney failure [usually temporary] (1 in 500), bleeding (1 in 200), allergic reaction [possibly serious] (1 in 200).  The patient understands and agrees to proceed.    Medication Adjustments/Labs and Tests Ordered: Current medicines are reviewed at length with the patient today.  Concerns regarding medicines are outlined above.  Orders Placed This Encounter  Procedures  . EKG 12-Lead   No orders of the defined types were placed in this encounter.   There are no Patient Instructions on file for this visit.   Signed, Kerin Ransom, PA-C  12/05/2019 2:13 PM    [facility]

## 2019-12-05 NOTE — Assessment & Plan Note (Signed)
Followed by PCP

## 2019-12-05 NOTE — Assessment & Plan Note (Signed)
Controlled.  

## 2019-12-05 NOTE — Assessment & Plan Note (Signed)
CHADS VASC=4, on Eliquis 

## 2019-12-06 ENCOUNTER — Telehealth: Payer: Self-pay | Admitting: *Deleted

## 2019-12-06 LAB — SARS CORONAVIRUS 2 (TAT 6-24 HRS): SARS Coronavirus 2: NEGATIVE

## 2019-12-06 NOTE — Telephone Encounter (Signed)
Pt contacted pre-catheterization scheduled at [facility] for: Thursday December 07, 2019 1 PM Verified arrival time and place: [facility]) at: 11 AM   No solid food after midnight prior to cath, clear liquids until 5 AM day of procedure.  Hold: Eliquis-none PM 12/05/19 until post procedure  Except hold medications AM meds can be  taken pre-cath with sips of water including: ASA 81 mg   Confirmed patient has responsible adult to drive home post procedure and be with patient first 24 hours after arriving home: yes  You are allowed ONE visitor in the waiting room during the time you are at the hospital for your procedure. Both you and your visitor must wear a mask once you enter the hospital.       COVID-19 Pre-Screening Questions:   In the past 14 days have you had a new cough, new headache, new nasal congestion, fever (100.4 or greater) unexplained body aches, new sore throat, or sudden loss of taste or sense of smell? no  In the past 14 days have you been around anyone with known Covid 19? no   Reviewed procedure/mask/visitor instructions, COVID-19 questions reviewed with patient.

## 2019-12-07 ENCOUNTER — Ambulatory Visit: Payer: PPO | Admitting: Student

## 2019-12-07 ENCOUNTER — Other Ambulatory Visit: Payer: Self-pay

## 2019-12-07 ENCOUNTER — Ambulatory Visit (HOSPITAL_COMMUNITY)
Admission: RE | Admit: 2019-12-07 | Discharge: 2019-12-07 | Disposition: A | Discharge status: Home / Self Care | Payer: PPO | Attending: Cardiovascular Disease | Admitting: Cardiovascular Disease

## 2019-12-07 ENCOUNTER — Ambulatory Visit (HOSPITAL_COMMUNITY)
Admission: RE | Disposition: A | Discharge status: Home / Self Care | Payer: Self-pay | Source: Home / Self Care | Attending: Cardiovascular Disease

## 2019-12-07 DIAGNOSIS — R079 Chest pain, unspecified: Secondary | ICD-10-CM | POA: Diagnosis not present

## 2019-12-07 DIAGNOSIS — I480 Paroxysmal atrial fibrillation: Secondary | ICD-10-CM | POA: Diagnosis not present

## 2019-12-07 DIAGNOSIS — I10 Essential (primary) hypertension: Secondary | ICD-10-CM | POA: Insufficient documentation

## 2019-12-07 DIAGNOSIS — Z79899 Other long term (current) drug therapy: Secondary | ICD-10-CM | POA: Diagnosis not present

## 2019-12-07 DIAGNOSIS — R072 Precordial pain: Secondary | ICD-10-CM | POA: Insufficient documentation

## 2019-12-07 DIAGNOSIS — Z882 Allergy status to sulfonamides status: Secondary | ICD-10-CM | POA: Insufficient documentation

## 2019-12-07 DIAGNOSIS — E785 Hyperlipidemia, unspecified: Secondary | ICD-10-CM | POA: Insufficient documentation

## 2019-12-07 DIAGNOSIS — E1140 Type 2 diabetes mellitus with diabetic neuropathy, unspecified: Secondary | ICD-10-CM | POA: Insufficient documentation

## 2019-12-07 DIAGNOSIS — Z881 Allergy status to other antibiotic agents status: Secondary | ICD-10-CM | POA: Diagnosis not present

## 2019-12-07 DIAGNOSIS — Z7901 Long term (current) use of anticoagulants: Secondary | ICD-10-CM | POA: Insufficient documentation

## 2019-12-07 DIAGNOSIS — Z880 Allergy status to penicillin: Secondary | ICD-10-CM | POA: Insufficient documentation

## 2019-12-07 HISTORY — PX: LEFT HEART CATH AND CORONARY ANGIOGRAPHY: CATH118249

## 2019-12-07 HISTORY — PX: AORTIC ARCH ANGIOGRAPHY: CATH118224

## 2019-12-07 LAB — BASIC METABOLIC PANEL
Anion gap: 11 (ref 5–15)
BUN: 11 mg/dL (ref 8–23)
CO2: 25 mmol/L (ref 22–32)
Calcium: 9.5 mg/dL (ref 8.9–10.3)
Chloride: 106 mmol/L (ref 98–111)
Creatinine, Ser: 1.06 mg/dL — ABNORMAL HIGH (ref 0.44–1.00)
GFR, Estimated: 55 mL/min — ABNORMAL LOW (ref >60)
Glucose, Bld: 109 mg/dL — ABNORMAL HIGH (ref 70–99)
Potassium: 4.4 mmol/L (ref 3.5–5.1)
Sodium: 142 mmol/L (ref 135–145)

## 2019-12-07 LAB — CBC
HCT: 43.2 % (ref 36.0–46.0)
Hemoglobin: 13.9 g/dL (ref 12.0–15.0)
MCH: 30.2 pg (ref 26.0–34.0)
MCHC: 32.2 g/dL (ref 30.0–36.0)
MCV: 93.7 fL (ref 80.0–100.0)
Platelets: 236 10*3/uL (ref 150–400)
RBC: 4.61 MIL/uL (ref 3.87–5.11)
RDW: 13.9 % (ref 11.5–15.5)
WBC: 5.3 10*3/uL (ref 4.0–10.5)
nRBC: 0 % (ref 0.0–0.2)

## 2019-12-07 SURGERY — LEFT HEART CATH AND CORONARY ANGIOGRAPHY
Anesthesia: LOCAL

## 2019-12-07 MED ORDER — SODIUM CHLORIDE 0.9 % WEIGHT BASED INFUSION: 1.0000 mL/kg/h | INTRAVENOUS | Status: DC

## 2019-12-07 MED ORDER — HEPARIN SODIUM (PORCINE) 1000 UNIT/ML IJ SOLN
INTRAMUSCULAR | Status: DC | PRN
  Administered 2019-12-07: 2000 [IU] via INTRAVENOUS
  Administered 2019-12-07: 4500 [IU] via INTRAVENOUS

## 2019-12-07 MED ORDER — SODIUM CHLORIDE 0.9% FLUSH: 3.0000 mL | Freq: Two times a day (BID) | INTRAVENOUS | Status: DC

## 2019-12-07 MED ORDER — LIDOCAINE HCL (PF) 1 % IJ SOLN
INTRAMUSCULAR | Status: DC | PRN
  Administered 2019-12-07: 2 mL

## 2019-12-07 MED ORDER — HEPARIN (PORCINE) IN NACL 1000-0.9 UT/500ML-% IV SOLN
INTRAVENOUS | Status: AC
  Filled 2019-12-07: qty 1000

## 2019-12-07 MED ORDER — SODIUM CHLORIDE 0.9 % IV SOLN: INTRAVENOUS | Status: DC

## 2019-12-07 MED ORDER — LABETALOL HCL 5 MG/ML IV SOLN: 10.0000 mg | INTRAVENOUS | Status: DC | PRN

## 2019-12-07 MED ORDER — HYDRALAZINE HCL 20 MG/ML IJ SOLN: 10.0000 mg | INTRAMUSCULAR | Status: DC | PRN

## 2019-12-07 MED ORDER — ONDANSETRON HCL 4 MG/2ML IJ SOLN: 4.0000 mg | Freq: Four times a day (QID) | INTRAMUSCULAR | Status: DC | PRN

## 2019-12-07 MED ORDER — SODIUM CHLORIDE 0.9 % IV SOLN: 250.0000 mL | INTRAVENOUS | Status: DC | PRN

## 2019-12-07 MED ORDER — ASPIRIN 81 MG PO CHEW: 81.0000 mg | CHEWABLE_TABLET | ORAL | Status: DC

## 2019-12-07 MED ORDER — VERAPAMIL HCL 2.5 MG/ML IV SOLN
INTRAVENOUS | Status: AC
  Filled 2019-12-07: qty 2

## 2019-12-07 MED ORDER — MORPHINE SULFATE (PF) 2 MG/ML IV SOLN: 2.0000 mg | INTRAVENOUS | Status: DC | PRN

## 2019-12-07 MED ORDER — HEPARIN SODIUM (PORCINE) 1000 UNIT/ML IJ SOLN
INTRAMUSCULAR | Status: AC
  Filled 2019-12-07: qty 1

## 2019-12-07 MED ORDER — SODIUM CHLORIDE 0.9% FLUSH: 3.0000 mL | INTRAVENOUS | Status: DC | PRN

## 2019-12-07 MED ORDER — LIDOCAINE HCL (PF) 1 % IJ SOLN
INTRAMUSCULAR | Status: AC
  Filled 2019-12-07: qty 30

## 2019-12-07 MED ORDER — IOHEXOL 350 MG/ML SOLN
INTRAVENOUS | Status: DC | PRN
  Administered 2019-12-07: 205 mL

## 2019-12-07 MED ORDER — HEPARIN (PORCINE) IN NACL 1000-0.9 UT/500ML-% IV SOLN
INTRAVENOUS | Status: DC | PRN
  Administered 2019-12-07: 500 mL

## 2019-12-07 MED ORDER — NITROGLYCERIN 1 MG/10 ML FOR IR/CATH LAB
INTRA_ARTERIAL | Status: AC
  Filled 2019-12-07: qty 10

## 2019-12-07 MED ORDER — VERAPAMIL HCL 2.5 MG/ML IV SOLN
INTRA_ARTERIAL | Status: DC | PRN
  Administered 2019-12-07: 10 mL via INTRA_ARTERIAL

## 2019-12-07 MED ORDER — SODIUM CHLORIDE 0.9 % WEIGHT BASED INFUSION
3.0000 mL/kg/h | INTRAVENOUS | Status: AC
  Administered 2019-12-07: 3 mL/kg/h via INTRAVENOUS

## 2019-12-07 MED ORDER — ACETAMINOPHEN 325 MG PO TABS: 650.0000 mg | ORAL_TABLET | ORAL | Status: DC | PRN

## 2019-12-07 SURGICAL SUPPLY — 21 items
CATH INFINITI 5 FR 3DRC (CATHETERS) ×2 IMPLANT
CATH INFINITI 5 FR AR1 MOD (CATHETERS) ×2 IMPLANT
CATH INFINITI 5FR AL1 (CATHETERS) ×2 IMPLANT
CATH INFINITI 5FR ANG PIGTAIL (CATHETERS) ×2 IMPLANT
CATH INFINITI 5FR MPB2 (CATHETERS) ×2 IMPLANT
CATH INFINITI JR4 5F (CATHETERS) ×2 IMPLANT
CATH LAUNCHER 5F RADR (CATHETERS) ×1 IMPLANT
CATH LAUNCHER 6FR AL.75 (CATHETERS) ×2 IMPLANT
CATH OPTITORQUE TIG 4.0 5F (CATHETERS) ×2 IMPLANT
CATH VISTA GUIDE 6FR XB3.5 (CATHETERS) ×2 IMPLANT
CATHETER LAUNCHER 5F RADR (CATHETERS) ×2
DEVICE RAD COMP TR BAND LRG (VASCULAR PRODUCTS) ×2 IMPLANT
GLIDESHEATH SLEND A-KIT 6F 22G (SHEATH) ×2 IMPLANT
GUIDEWIRE INQWIRE 1.5J.035X260 (WIRE) ×1 IMPLANT
INQWIRE 1.5J .035X260CM (WIRE) ×2
KIT HEART LEFT (KITS) ×2 IMPLANT
PACK CARDIAC CATHETERIZATION (CUSTOM PROCEDURE TRAY) ×2 IMPLANT
SYR MEDRAD MARK 7 150ML (SYRINGE) ×2 IMPLANT
TRANSDUCER W/STOPCOCK (MISCELLANEOUS) ×2 IMPLANT
TUBING CIL FLEX 10 FLL-RA (TUBING) ×2 IMPLANT
WIRE HI TORQ VERSACORE-J 145CM (WIRE) ×2 IMPLANT

## 2019-12-07 NOTE — Progress Notes (Deleted)
Discharge instructions reviewed with pt and her husband Percell Miller (via telephone) ambulated in hallway [facility]

## 2019-12-07 NOTE — Interval H&P Note (Signed)
Cath Lab Visit (complete for each Cath Lab visit)  Clinical Evaluation Leading to the Procedure:   ACS: No.  Non-ACS:    Anginal Classification: CCS II  Anti-ischemic medical therapy: Maximal Therapy (2 or more classes of medications)  Non-Invasive Test Results: Low-risk stress test findings: cardiac mortality <1%/year  Prior CABG: No previous CABG      History and Physical Interval Note:  12/07/2019 10:46 AM  Angela Simmons  has presented today for surgery, with the diagnosis of chest pain.  The various methods of treatment have been discussed with the patient and family. After consideration of risks, benefits and other options for treatment, the patient has consented to  Procedure(s): LEFT HEART CATH AND CORONARY ANGIOGRAPHY (N/A) as a surgical intervention.  The patient's history has been reviewed, patient examined, no change in status, stable for surgery.  I have reviewed the patient's chart and labs.  Questions were answered to the patient's satisfaction.     Quay Burow

## 2019-12-07 NOTE — Discharge Instructions (Signed)
DRINK PLENTY OF FLUIDS OVER THE NEXT 2-3 DAYS. Radial Site Care  This sheet gives you information about how to care for yourself after your procedure. Your health care provider may also give you more specific instructions. If you have problems or questions, contact your health care provider. What can I expect after the procedure? After the procedure, it is common to have:  Bruising and tenderness at the catheter insertion area. Follow these instructions at home: Medicines  Take over-the-counter and prescription medicines only as told by your health care provider. Insertion site care  Follow instructions from your health care provider about how to take care of your insertion site. Make sure you: ? Wash your hands with soap and water before you change your bandage (dressing). If soap and water are not available, use hand sanitizer. ? Change your dressing as told by your health care provider. ? Leave stitches (sutures), skin glue, or adhesive strips in place. These skin closures may need to stay in place for 2 weeks or longer. If adhesive strip edges start to loosen and curl up, you may trim the loose edges. Do not remove adhesive strips completely unless your health care provider tells you to do that.  Check your insertion site every day for signs of infection. Check for: ? Redness, swelling, or pain. ? Fluid or blood. ? Pus or a bad smell. ? Warmth.  Do not take baths, swim, or use a hot tub until your health care provider approves.  You may shower 24-48 hours after the procedure, or as directed by your health care provider. ? Remove the dressing and gently wash the site with plain soap and water. ? Pat the area dry with a clean towel. ? Do not rub the site. That could cause bleeding.  Do not apply powder or lotion to the site. Activity   For 24 hours after the procedure, or as directed by your health care provider: ? Do not flex or bend the affected arm. ? Do not push or pull  heavy objects with the affected arm. ? Do not drive yourself home from the hospital or clinic. You may drive 24 hours after the procedure unless your health care provider tells you not to. ? Do not operate machinery or power tools.  Do not lift anything that is heavier than 10 lb (4.5 kg), or the limit that you are told, until your health care provider says that it is safe.  Ask your health care provider when it is okay to: ? Return to work or school. ? Resume usual physical activities or sports. ? Resume sexual activity. General instructions  If the catheter site starts to bleed, raise your arm and put firm pressure on the site. If the bleeding does not stop, get help right away. This is a medical emergency.  If you went home on the same day as your procedure, a responsible adult should be with you for the first 24 hours after you arrive home.  Keep all follow-up visits as told by your health care provider. This is important. Contact a health care provider if:  You have a fever.  You have redness, swelling, or yellow drainage around your insertion site. Get help right away if:  You have unusual pain at the radial site.  The catheter insertion area swells very fast.  The insertion area is bleeding, and the bleeding does not stop when you hold steady pressure on the area.  Your arm or hand becomes pale, cool, tingly,   or numb. These symptoms may represent a serious problem that is an emergency. Do not wait to see if the symptoms will go away. Get medical help right away. Call your local emergency services (911 in the [place] Do not drive yourself to the hospital. Summary  After the procedure, it is common to have bruising and tenderness at the site.  Follow instructions from your health care provider about how to take care of your radial site wound. Check the wound every day for signs of infection.  Do not lift anything that is heavier than 10 lb (4.5 kg), or the limit that you are  told, until your health care provider says that it is safe. This information is not intended to replace advice given to you by your health care provider. Make sure you discuss any questions you have with your health care provider. Document Revised: 02/10/2017 Document Reviewed: 02/10/2017 Elsevier Patient Education  2020 [employer]  

## 2019-12-07 NOTE — Progress Notes (Addendum)
Ria Comment, Np paged  about when pt should restart Eliquis. States ok to restart tomorrow. Pt informed

## 2019-12-08 ENCOUNTER — Telehealth: Payer: Self-pay | Admitting: Cardiology

## 2019-12-08 ENCOUNTER — Encounter (HOSPITAL_COMMUNITY): Payer: Self-pay | Admitting: Cardiovascular Disease

## 2019-12-08 MED FILL — Heparin Sod (Porcine)-NaCl IV Soln 1000 Unit/500ML-0.9%: INTRAVENOUS | Qty: 500 | Status: AC

## 2019-12-08 NOTE — Telephone Encounter (Signed)
New message    Tressia Danas called to check to see if the medication on Deisi;s discharge papers are correct, she said that they added aspirin and nitroglycerin , when is she up to take these are they for emergency use or everyday?

## 2019-12-08 NOTE — Telephone Encounter (Signed)
Spoke with daughter who ask about ASA being added to pt list. Pt is currently taking Eliquis 5 mg BID. Please advise.

## 2019-12-12 NOTE — Telephone Encounter (Signed)
I spoke with family member who called.patient was to take one Aspirin the morning of heart cath.

## 2019-12-19 ENCOUNTER — Telehealth: Payer: Self-pay | Admitting: Cardiology

## 2019-12-19 ENCOUNTER — Telehealth: Payer: Self-pay | Admitting: Pharmacist

## 2019-12-19 NOTE — Progress Notes (Signed)
Chronic Care Management Pharmacy Assistant   Name: Angela Simmons  MRN: [id number] DOB: [birth year]  Reason for Encounter: Chart review   PCP : Binnie Rail, MD  Allergies:   Allergies  Allergen Reactions  . Levofloxacin Hives and Itching  . Nitrofurantoin Hives and Itching  . Penicillins Hives and Itching  . Sulfonamide Derivatives Itching  . Pantoprazole Other (See Comments)    Bad dreams    Medications: Outpatient Encounter Medications as of 12/19/2019  Medication Sig  . apixaban (ELIQUIS) 5 MG TABS tablet Take 1 tablet (5 mg total) by mouth 2 (two) times daily.  Marland Kitchen atorvastatin (LIPITOR) 10 MG tablet TAKE 1 TABLET BY MOUTH DAILY (Patient taking differently: Take 10 mg by mouth at bedtime. )  . benazepril (LOTENSIN) 40 MG tablet TAKE 1 TABLET BY MOUTH EVERY DAY (Patient taking differently: Take 40 mg by mouth daily. )  . Blood Glucose Monitoring Suppl (ONETOUCH VERIO) w/Device KIT USE TO TEST BLOOD SUGAR ONCE DAILY AS DIRECTED  . diclofenac sodium (VOLTAREN) 1 % GEL Apply 3 g to 3 large joints up to 3 times daily. (Patient taking differently: Apply 3 g topically 3 (three) times daily as needed (pain). 3 large joints)  . diltiazem (CARDIZEM CD) 180 MG 24 hr capsule Take 1 capsule (180 mg total) by mouth daily.  . fluticasone (FLONASE) 50 MCG/ACT nasal spray USE 2 SPRAYS IN EACH NOSTRIL EVERY DAY (Patient taking differently: Place 2 sprays into both nostrils daily as needed for allergies. )  . Ginger, Zingiber officinalis, 550 MG CAPS Take 1 capsule (550 mg total) by mouth daily. (Patient taking differently: Take 550 mg by mouth daily. )  . glucose blood test strip USE TO TEST BLOOD SUGAR ONCE DAILY. E11.40  . hydroxychloroquine (PLAQUENIL) 200 MG tablet TAKE 1 TABLET BY MOUTH ONCE DAILY ON MONDAY-FRIDAY (Patient taking differently: Take 200 mg by mouth See admin instructions. TAKE 200 mg ON MONDAY-FRIDAY)  . loratadine (CLARITIN) 10 MG tablet TAKE 1 TABLET BY MOUTH EVERY DAY  (Patient taking differently: Take 10 mg by mouth daily. )  . metoprolol tartrate (LOPRESSOR) 25 MG tablet Take 1 tablet (25 mg total) by mouth 2 (two) times daily.  . Multiple Vitamins-Minerals (CENTRUM SILVER 50+WOMEN) TABS Take 1 tablet by mouth daily at 6 (six) AM.  . nitroGLYCERIN (NITROSTAT) 0.4 MG SL tablet Place 1 tablet (0.4 mg total) under the tongue every 5 (five) minutes as needed for chest pain. (Patient not taking: Reported on 12/06/2019)  . Omega-3 Fatty Acids (FISH OIL) 1000 MG CAPS Take 1,000 mg by mouth daily.   Marland Kitchen omeprazole (PRILOSEC) 40 MG capsule TAKE ONE CAPSULE BY MOUTH DAILY (Patient taking differently: Take 40 mg by mouth daily. )  . ONETOUCH DELICA LANCETS 55D MISC USE TO TEST ONCE DAILY  . ONETOUCH VERIO test strip USE TO TEST BLOOD SUGAR ONCE DAILY  . triamcinolone ointment (KENALOG) 0.5 % Apply 1 application topically 2 (two) times daily. Do not use for more than 14 days in a row (Patient taking differently: Apply 1 application topically daily as needed (Rash). )  . Turmeric (QC TUMERIC COMPLEX) 500 MG CAPS Take 1 capsule by mouth daily. (Patient taking differently: Take 500 mg by mouth daily. )   No facility-administered encounter medications on file as of 12/19/2019.    Current Diagnosis: Patient Active Problem List   Diagnosis Date Noted  . Anticoagulated 12/05/2019  . PAF (paroxysmal atrial fibrillation) ([place]) 08/04/2019  . Thrush 08/02/2019  .  Anemia 08/02/2019  . Community acquired pneumonia 07/22/2019  . Burning sensation 06/21/2019  . DOE (dyspnea on exertion) 04/03/2019  . Ankle swelling, left 04/03/2019  . Hyperpigmented skin lesion 04/03/2019  . Poor balance 12/06/2018  . Headache 12/06/2018  . Vertigo 06/07/2018  . Urinary frequency 06/07/2018  . Lower abdominal pain 06/07/2018  . Herpes zoster without complication 49/49/4473  . Tingling in extremities 11/26/2017  . Ischial bursitis of left side 06/02/2017  . Sicca syndrome ([place]) 06/23/2016    . Bilateral leg edema 04/07/2016  . Burning sensation of skin 04/07/2016  . PMR (polymyalgia rheumatica) ([facility]) 03/27/2016  . Osteopenia 03/04/2016  . Autoimmune disease ([place]) 03/18/2015  . Inflammatory arthritis 03/18/2015  . Joint pain 11/22/2014  . Overweight (BMI 25.0-29.9) 08/18/2014  . Low back pain with radiation 08/18/2014  . Hyperlipidemia 06/28/2014  . Chest pain with moderate risk of acute coronary syndrome 04/11/2014  . Tendonitis of shoulder 08/29/2013  . S/P right mastectomy 11/14/2012  . Type 2 diabetes, controlled, with neuropathy ([place]) 10/05/2012  . Allergic rhinitis 01/26/2011  . GERD 11/10/2009  . INTERSTITIAL CYSTITIS 10/31/2008  . BENIGN NEOPLASM OF VULVA 02/01/2008  . Essential hypertension, benign 02/08/2007  . History of cancer of right breast 02/08/2007    Goals Addressed   None     Follow-Up:  Pharmacist Review    [name], [place]

## 2019-12-19 NOTE — Telephone Encounter (Signed)
New message    Patient daughter called , since the patient had her heart cath she has been having pain in her back , cant use the restroom , generally not feeling well, would like to speak with nurse or Dr Harl Bowie

## 2019-12-19 NOTE — Telephone Encounter (Signed)
I spoke with patient.She states she felt bad yesterday and told her daughter. Today she feels better, without c/o back pain and reports she is urinating normally. Her biggest complaint is fatigue. She is going to rest today and call back tomorrow with update.

## 2019-12-19 NOTE — Progress Notes (Signed)
Chronic Care Management Pharmacy Assistant   Name: Angela Simmons  MRN: [id number] DOB: Oct 08, 1945  Reason for Encounter: Medication Adherence Call   PCP : Binnie Rail, MD  Allergies:   Allergies  Allergen Reactions  . Levofloxacin Hives and Itching  . Nitrofurantoin Hives and Itching  . Penicillins Hives and Itching  . Sulfonamide Derivatives Itching  . Pantoprazole Other (See Comments)    Bad dreams    Medications: Outpatient Encounter Medications as of 12/19/2019  Medication Sig  . apixaban (ELIQUIS) 5 MG TABS tablet Take 1 tablet (5 mg total) by mouth 2 (two) times daily.  Marland Kitchen atorvastatin (LIPITOR) 10 MG tablet TAKE 1 TABLET BY MOUTH DAILY (Patient taking differently: Take 10 mg by mouth at bedtime. )  . benazepril (LOTENSIN) 40 MG tablet TAKE 1 TABLET BY MOUTH EVERY DAY (Patient taking differently: Take 40 mg by mouth daily. )  . Blood Glucose Monitoring Suppl (ONETOUCH VERIO) w/Device KIT USE TO TEST BLOOD SUGAR ONCE DAILY AS DIRECTED  . diclofenac sodium (VOLTAREN) 1 % GEL Apply 3 g to 3 large joints up to 3 times daily. (Patient taking differently: Apply 3 g topically 3 (three) times daily as needed (pain). 3 large joints)  . diltiazem (CARDIZEM CD) 180 MG 24 hr capsule Take 1 capsule (180 mg total) by mouth daily.  . fluticasone (FLONASE) 50 MCG/ACT nasal spray USE 2 SPRAYS IN EACH NOSTRIL EVERY DAY (Patient taking differently: Place 2 sprays into both nostrils daily as needed for allergies. )  . Ginger, Zingiber officinalis, 550 MG CAPS Take 1 capsule (550 mg total) by mouth daily. (Patient taking differently: Take 550 mg by mouth daily. )  . glucose blood test strip USE TO TEST BLOOD SUGAR ONCE DAILY. E11.40  . hydroxychloroquine (PLAQUENIL) 200 MG tablet TAKE 1 TABLET BY MOUTH ONCE DAILY ON MONDAY-FRIDAY (Patient taking differently: Take 200 mg by mouth See admin instructions. TAKE 200 mg ON MONDAY-FRIDAY)  . loratadine (CLARITIN) 10 MG tablet TAKE 1 TABLET BY MOUTH  EVERY DAY (Patient taking differently: Take 10 mg by mouth daily. )  . metoprolol tartrate (LOPRESSOR) 25 MG tablet Take 1 tablet (25 mg total) by mouth 2 (two) times daily.  . Multiple Vitamins-Minerals (CENTRUM SILVER 50+WOMEN) TABS Take 1 tablet by mouth daily at 6 (six) AM.  . nitroGLYCERIN (NITROSTAT) 0.4 MG SL tablet Place 1 tablet (0.4 mg total) under the tongue every 5 (five) minutes as needed for chest pain. (Patient not taking: Reported on 12/06/2019)  . Omega-3 Fatty Acids (FISH OIL) 1000 MG CAPS Take 1,000 mg by mouth daily.   Marland Kitchen omeprazole (PRILOSEC) 40 MG capsule TAKE ONE CAPSULE BY MOUTH DAILY (Patient taking differently: Take 40 mg by mouth daily. )  . ONETOUCH DELICA LANCETS 40H MISC USE TO TEST ONCE DAILY  . ONETOUCH VERIO test strip USE TO TEST BLOOD SUGAR ONCE DAILY  . triamcinolone ointment (KENALOG) 0.5 % Apply 1 application topically 2 (two) times daily. Do not use for more than 14 days in a row (Patient taking differently: Apply 1 application topically daily as needed (Rash). )  . Turmeric (QC TUMERIC COMPLEX) 500 MG CAPS Take 1 capsule by mouth daily. (Patient taking differently: Take 500 mg by mouth daily. )   No facility-administered encounter medications on file as of 12/19/2019.    Current Diagnosis: Patient Active Problem List   Diagnosis Date Noted  . Anticoagulated 12/05/2019  . PAF (paroxysmal atrial fibrillation) ([place]) 08/04/2019  . Thrush 08/02/2019  .  Anemia 08/02/2019  . Community acquired pneumonia 07/22/2019  . Burning sensation 06/21/2019  . DOE (dyspnea on exertion) 04/03/2019  . Ankle swelling, left 04/03/2019  . Hyperpigmented skin lesion 04/03/2019  . Poor balance 12/06/2018  . Headache 12/06/2018  . Vertigo 06/07/2018  . Urinary frequency 06/07/2018  . Lower abdominal pain 06/07/2018  . Herpes zoster without complication 39/67/2897  . Tingling in extremities 11/26/2017  . Ischial bursitis of left side 06/02/2017  . Sicca syndrome ([place])  06/23/2016  . Bilateral leg edema 04/07/2016  . Burning sensation of skin 04/07/2016  . PMR (polymyalgia rheumatica) ([facility]) 03/27/2016  . Osteopenia 03/04/2016  . Autoimmune disease ([place]) 03/18/2015  . Inflammatory arthritis 03/18/2015  . Joint pain 11/22/2014  . Overweight (BMI 25.0-29.9) 08/18/2014  . Low back pain with radiation 08/18/2014  . Hyperlipidemia 06/28/2014  . Chest pain with moderate risk of acute coronary syndrome 04/11/2014  . Tendonitis of shoulder 08/29/2013  . S/P right mastectomy 11/14/2012  . Type 2 diabetes, controlled, with neuropathy ([place]) 10/05/2012  . Allergic rhinitis 01/26/2011  . GERD 11/10/2009  . INTERSTITIAL CYSTITIS 10/31/2008  . BENIGN NEOPLASM OF VULVA 02/01/2008  . Essential hypertension, benign 02/08/2007  . History of cancer of right breast 02/08/2007    Goals Addressed   None     Follow-Up:  Pharmacist Review    The patient was on the medication adherence list for her Benazepril Hcl 63m. The patient was recently discharged from the hospital on 12/07/2019. Her discharged instructions was to continue to take the medication once daily.   DRosendo Gros NSan [facility] Practice Team Manager/ CPA (Clinical Pharmacist Assistant) [phone]

## 2019-12-20 NOTE — Telephone Encounter (Signed)
Patient called back stating she has much more energy today and had yesterday.

## 2019-12-21 ENCOUNTER — Other Ambulatory Visit: Payer: Self-pay | Admitting: *Deleted

## 2019-12-21 DIAGNOSIS — R5381 Other malaise: Secondary | ICD-10-CM | POA: Diagnosis not present

## 2019-12-21 DIAGNOSIS — M359 Systemic involvement of connective tissue, unspecified: Secondary | ICD-10-CM | POA: Diagnosis not present

## 2019-12-21 DIAGNOSIS — R300 Dysuria: Secondary | ICD-10-CM | POA: Diagnosis not present

## 2019-12-21 DIAGNOSIS — Z79899 Other long term (current) drug therapy: Secondary | ICD-10-CM | POA: Diagnosis not present

## 2019-12-21 DIAGNOSIS — E559 Vitamin D deficiency, unspecified: Secondary | ICD-10-CM | POA: Diagnosis not present

## 2019-12-22 LAB — SEDIMENTATION RATE: Sed Rate: 43 mm/h — ABNORMAL HIGH (ref 0–30)

## 2019-12-22 LAB — ANTI-DNA ANTIBODY, DOUBLE-STRANDED: ds DNA Ab: <1 IU/mL

## 2019-12-22 LAB — C3 AND C4
C3 Complement: 122 mg/dL (ref 83–193)
C4 Complement: 39 mg/dL (ref 15–57)

## 2019-12-22 NOTE — Progress Notes (Signed)
ESR remains elevated but stable. DsDNA negative and complements WNL.  No changes recommended at this time.

## 2019-12-26 DIAGNOSIS — D6869 Other thrombophilia: Secondary | ICD-10-CM | POA: Insufficient documentation

## 2019-12-26 NOTE — Patient Instructions (Addendum)
Blood work was ordered.     Flu immunization administered today.   Medications changes include :  none      Please followup in 6 months    Health Maintenance, Female Adopting a healthy lifestyle and getting preventive care are important in promoting health and wellness. Ask your health care provider about:  The right schedule for you to have regular tests and exams.  Things you can do on your own to prevent diseases and keep yourself healthy. What should I know about diet, weight, and exercise? Eat a healthy diet   Eat a diet that includes plenty of vegetables, fruits, low-fat dairy products, and lean protein.  Do not eat a lot of foods that are high in solid fats, added sugars, or sodium. Maintain a healthy weight Body mass index (BMI) is used to identify weight problems. It estimates body fat based on height and weight. Your health care provider can help determine your BMI and help you achieve or maintain a healthy weight. Get regular exercise Get regular exercise. This is one of the most important things you can do for your health. Most adults should:  Exercise for at least 150 minutes each week. The exercise should increase your heart rate and make you sweat (moderate-intensity exercise).  Do strengthening exercises at least twice a week. This is in addition to the moderate-intensity exercise.  Spend less time sitting. Even light physical activity can be beneficial. Watch cholesterol and blood lipids Have your blood tested for lipids and cholesterol at 73 years of age, then have this test every 5 years. Have your cholesterol levels checked more often if:  Your lipid or cholesterol levels are high.  You are older than 73 years of age.  You are at high risk for heart disease. What should I know about cancer screening? Depending on your health history and family history, you may need to have cancer screening at various ages. This may include screening for:  Breast  cancer.  Cervical cancer.  Colorectal cancer.  Skin cancer.  Lung cancer. What should I know about heart disease, diabetes, and high blood pressure? Blood pressure and heart disease  High blood pressure causes heart disease and increases the risk of stroke. This is more likely to develop in people who have high blood pressure readings, are of African descent, or are overweight.  Have your blood pressure checked: ? Every 3-5 years if you are 21-43 years of age. ? Every year if you are 62 years old or older. Diabetes Have regular diabetes screenings. This checks your fasting blood sugar level. Have the screening done:  Once every three years after age 75 if you are at a normal weight and have a low risk for diabetes.  More often and at a younger age if you are overweight or have a high risk for diabetes. What should I know about preventing infection? Hepatitis B If you have a higher risk for hepatitis B, you should be screened for this virus. Talk with your health care provider to find out if you are at risk for hepatitis B infection. Hepatitis C Testing is recommended for:  Everyone born from [birth year] through 1964.  Anyone with known risk factors for hepatitis C. Sexually transmitted infections (STIs)  Get screened for STIs, including gonorrhea and chlamydia, if: ? You are sexually active and are younger than 73 years of age. ? You are older than 73 years of age and your health care provider tells you that you are at  risk for this type of infection. ? Your sexual activity has changed since you were last screened, and you are at increased risk for chlamydia or gonorrhea. Ask your health care provider if you are at risk.  Ask your health care provider about whether you are at high risk for HIV. Your health care provider may recommend a prescription medicine to help prevent HIV infection. If you choose to take medicine to prevent HIV, you should first get tested for HIV. You should  then be tested every 3 months for as long as you are taking the medicine. Pregnancy  If you are about to stop having your period (premenopausal) and you may become pregnant, seek counseling before you get pregnant.  Take 400 to 800 micrograms (mcg) of folic acid every day if you become pregnant.  Ask for birth control (contraception) if you want to prevent pregnancy. Osteoporosis and menopause Osteoporosis is a disease in which the bones lose minerals and strength with aging. This can result in bone fractures. If you are 61 years old or older, or if you are at risk for osteoporosis and fractures, ask your health care provider if you should:  Be screened for bone loss.  Take a calcium or vitamin D supplement to lower your risk of fractures.  Be given hormone replacement therapy (HRT) to treat symptoms of menopause. Follow these instructions at home: Lifestyle  Do not use any products that contain nicotine or tobacco, such as cigarettes, e-cigarettes, and chewing tobacco. If you need help quitting, ask your health care provider.  Do not use street drugs.  Do not share needles.  Ask your health care provider for help if you need support or information about quitting drugs. Alcohol use  Do not drink alcohol if: ? Your health care provider tells you not to drink. ? You are pregnant, may be pregnant, or are planning to become pregnant.  If you drink alcohol: ? Limit how much you use to 0-1 drink a day. ? Limit intake if you are breastfeeding.  Be aware of how much alcohol is in your drink. In the [place] one drink equals one 12 oz bottle of beer (355 mL), one 5 oz glass of wine (148 mL), or one 1 oz glass of hard liquor (44 mL). General instructions  Schedule regular health, dental, and eye exams.  Stay current with your vaccines.  Tell your health care provider if: ? You often feel depressed. ? You have ever been abused or do not feel safe at home. Summary  Adopting a  healthy lifestyle and getting preventive care are important in promoting health and wellness.  Follow your health care provider's instructions about healthy diet, exercising, and getting tested or screened for diseases.  Follow your health care provider's instructions on monitoring your cholesterol and blood pressure. This information is not intended to replace advice given to you by your health care provider. Make sure you discuss any questions you have with your health care provider. Document Revised: 12/29/2017 Document Reviewed: 12/29/2017 Elsevier Patient Education  2020 [employer].

## 2019-12-26 NOTE — Progress Notes (Signed)
Subjective:    Patient ID: Angela Simmons, female    DOB: 1945/10/24, 74 y.o.   MRN: [id number]   This visit occurred during the SARS-CoV-2 public health emergency.  Safety protocols were in place, including screening questions prior to the visit, additional usage of staff PPE, and extensive cleaning of exam room while observing appropriate contact time as indicated for disinfecting solutions.    HPI Here for a physical exam. Her daughter is here with her.  She has occasional heart flutters.  She did see cardiology and they wanted to do an additional cardiac catheterization, but she feels since she is feeling okay at this time she would like to hold off.  She had a draining lesion on her abdomen, but she feels that it has declined and is better now.  Her daughter was wondering about having her memory tested.  She does not think it is really bad, but she does repeat herself at times.   Medications and allergies reviewed with patient and updated if appropriate.  Patient Active Problem List   Diagnosis Date Noted  . Secondary hypercoagulable state ([facility]) 12/26/2019  . Anticoagulated 12/05/2019  . PAF (paroxysmal atrial fibrillation) ([place]) 08/04/2019  . Thrush 08/02/2019  . Anemia 08/02/2019  . Community acquired pneumonia 07/22/2019  . Burning sensation 06/21/2019  . DOE (dyspnea on exertion) 04/03/2019  . Ankle swelling, left 04/03/2019  . Hyperpigmented skin lesion 04/03/2019  . Poor balance 12/06/2018  . Headache 12/06/2018  . Vertigo 06/07/2018  . Urinary frequency 06/07/2018  . Lower abdominal pain 06/07/2018  . Herpes zoster without complication 35/82/5189  . Tingling in extremities 11/26/2017  . Ischial bursitis of left side 06/02/2017  . Sicca syndrome ([place]) 06/23/2016  . Bilateral leg edema 04/07/2016  . Burning sensation of skin 04/07/2016  . PMR (polymyalgia rheumatica) ([facility]) 03/27/2016  . Osteopenia 03/04/2016  . Autoimmune disease ([place]) 03/18/2015  .  Inflammatory arthritis 03/18/2015  . Joint pain 11/22/2014  . Overweight (BMI 25.0-29.9) 08/18/2014  . Low back pain with radiation 08/18/2014  . Hyperlipidemia 06/28/2014  . Chest pain with moderate risk of acute coronary syndrome 04/11/2014  . Tendonitis of shoulder 08/29/2013  . S/P right mastectomy 11/14/2012  . Type 2 diabetes, controlled, with neuropathy ([place]) 10/05/2012  . Allergic rhinitis 01/26/2011  . GERD 11/10/2009  . INTERSTITIAL CYSTITIS 10/31/2008  . BENIGN NEOPLASM OF VULVA 02/01/2008  . Essential hypertension, benign 02/08/2007  . History of cancer of right breast 02/08/2007    Current Outpatient Medications on File Prior to Visit  Medication Sig Dispense Refill  . apixaban (ELIQUIS) 5 MG TABS tablet Take 1 tablet (5 mg total) by mouth 2 (two) times daily. 60 tablet 6  . atorvastatin (LIPITOR) 10 MG tablet TAKE 1 TABLET BY MOUTH DAILY (Patient taking differently: Take 10 mg by mouth at bedtime. ) 90 tablet 1  . benazepril (LOTENSIN) 40 MG tablet TAKE 1 TABLET BY MOUTH EVERY DAY (Patient taking differently: Take 40 mg by mouth daily. ) 90 tablet 1  . Blood Glucose Monitoring Suppl (ONETOUCH VERIO) w/Device KIT USE TO TEST BLOOD SUGAR ONCE DAILY AS DIRECTED 1 kit 0  . diclofenac sodium (VOLTAREN) 1 % GEL Apply 3 g to 3 large joints up to 3 times daily. (Patient taking differently: Apply 3 g topically 3 (three) times daily as needed (pain). 3 large joints) 3 Tube 3  . fluticasone (FLONASE) 50 MCG/ACT nasal spray USE 2 SPRAYS IN EACH NOSTRIL EVERY DAY (Patient taking differently: Place  2 sprays into both nostrils daily as needed for allergies. ) 48 g 0  . Ginger, Zingiber officinalis, 550 MG CAPS Take 1 capsule (550 mg total) by mouth daily. (Patient taking differently: Take 550 mg by mouth daily. ) 60 capsule 0  . glucose blood test strip USE TO TEST BLOOD SUGAR ONCE DAILY. E11.40 100 each 3  . hydroxychloroquine (PLAQUENIL) 200 MG tablet TAKE 1 TABLET BY MOUTH ONCE DAILY ON  MONDAY-FRIDAY (Patient taking differently: Take 200 mg by mouth See admin instructions. TAKE 200 mg ON MONDAY-FRIDAY) 60 tablet 0  . loratadine (CLARITIN) 10 MG tablet TAKE 1 TABLET BY MOUTH EVERY DAY (Patient taking differently: Take 10 mg by mouth daily. ) 30 tablet 2  . metoprolol tartrate (LOPRESSOR) 25 MG tablet Take 1 tablet (25 mg total) by mouth 2 (two) times daily. 60 tablet 6  . Multiple Vitamins-Minerals (CENTRUM SILVER 50+WOMEN) TABS Take 1 tablet by mouth daily at 6 (six) AM.    . nitroGLYCERIN (NITROSTAT) 0.4 MG SL tablet Place 1 tablet (0.4 mg total) under the tongue every 5 (five) minutes as needed for chest pain. 25 tablet 3  . Omega-3 Fatty Acids (FISH OIL) 1000 MG CAPS Take 1,000 mg by mouth daily.     Marland Kitchen omeprazole (PRILOSEC) 40 MG capsule TAKE ONE CAPSULE BY MOUTH DAILY (Patient taking differently: Take 40 mg by mouth daily. ) 90 capsule 3  . ONETOUCH DELICA LANCETS 42A MISC USE TO TEST ONCE DAILY 100 each 2  . ONETOUCH VERIO test strip USE TO TEST BLOOD SUGAR ONCE DAILY 100 each 0  . triamcinolone ointment (KENALOG) 0.5 % Apply 1 application topically 2 (two) times daily. Do not use for more than 14 days in a row (Patient taking differently: Apply 1 application topically daily as needed (Rash). ) 15 g 0  . Turmeric (QC TUMERIC COMPLEX) 500 MG CAPS Take 1 capsule by mouth daily. (Patient taking differently: Take 500 mg by mouth daily. )    . diltiazem (CARDIZEM CD) 180 MG 24 hr capsule Take 1 capsule (180 mg total) by mouth daily. 90 capsule 1   No current facility-administered medications on file prior to visit.    Past Medical History:  Diagnosis Date  . Breast cancer ([facility]) 1990s   right breast  . Collagen vascular disease ([place])   . Diabetes mellitus without complication ([place])   . GERD (gastroesophageal reflux disease)   . HTN (hypertension)   . Hypercholesterolemia   . Obesity   . Osteoarthritis     Past Surgical History:  Procedure Laterality Date  . ABDOMINAL  [place]CTOMY  [place]  . AORTIC ARCH ANGIOGRAPHY N/A 12/07/2019   Procedure: AORTIC ARCH ANGIOGRAPHY;  Surgeon: Lorretta Harp, MD;  Location: [facility];  Service: Cardiovascular;  Laterality: N/A;  . COLONOSCOPY  May 2003   Dr. Tamala Julian: normal  . COLONOSCOPY  06/22/2011   Procedure: COLONOSCOPY;  Surgeon: Danie Binder, MD;  [facility];  Service: Endoscopy;  Laterality: N/A;  10:30  . LEFT HEART CATH AND CORONARY ANGIOGRAPHY N/A 12/07/2019   Procedure: LEFT HEART CATH AND CORONARY ANGIOGRAPHY;  Surgeon: Lorretta Harp, MD;  Location: [facility];  Service: Cardiovascular;  Laterality: N/A;  . MASTECTOMY  1995   right     Social History   Socioeconomic History  . Marital status: Married    Spouse name: Not on file  . Number of children: 3  . Years of  education: 7  . Highest education level: Not on file  Occupational History  . Not on file  Tobacco Use  . Smoking status: Never Smoker  . Smokeless tobacco: Never Used  Vaping Use  . Vaping Use: Never used  Substance and Sexual Activity  . Alcohol use: No  . Drug use: Never  . Sexual activity: Never    Birth control/protection: Rhythm  Other Topics Concern  . Not on file  Social History Narrative   Lives at home with husband   Caffeine, maybe 1 a day   Social Determinants of Health   Financial Resource Strain: Low Risk   . Difficulty of Paying Living Expenses: Not very hard  Food Insecurity:   . Worried About Charity fundraiser in the Last Year: Not on file  . Ran Out of Food in the Last Year: Not on file  Transportation Needs:   . Lack of Transportation (Medical): Not on file  . Lack of Transportation (Non-Medical): Not on file  Physical Activity:   . Days of Exercise per Week: Not on file  . Minutes of Exercise per Session: Not on file  Stress:   . Feeling of Stress : Not on file  Social Connections:   . Frequency of Communication with Friends and Family: Not on file   . Frequency of Social Gatherings with Friends and Family: Not on file  . Attends Religious Services: Not on file  . Active Member of Clubs or Organizations: Not on file  . Attends Archivist Meetings: Not on file  . Marital Status: Not on file    Family History  Problem Relation Age of Onset  . Diabetes Mother        diabetic coma  . Heart failure Mother   . Diabetes Father   . Emphysema Father   . Diabetes Sister   . Hypercholesterolemia Sister   . Hypercholesterolemia Brother   . Diabetes Brother   . Hypertension Brother   . Hypercholesterolemia Brother   . Diabetes Sister   . Hypertension Sister   . Hypercholesterolemia Sister   . Hypercholesterolemia Sister   . Hypercholesterolemia Sister   . Diabetes Brother   . Hypertension Brother   . Colon cancer Neg Hx     Review of Systems  Constitutional: Negative for chills and fever.  Eyes: Negative for visual disturbance.  Respiratory: Positive for cough. Negative for shortness of breath and wheezing.   Cardiovascular: Positive for palpitations and leg swelling (mild left ankle). Negative for chest pain.  Gastrointestinal: Positive for constipation and nausea. Negative for abdominal pain, blood in stool and diarrhea.       No gerd  Genitourinary: Negative for dysuria.  Musculoskeletal: Positive for arthralgias (knees) and joint swelling.  Skin: Negative for color change and rash.       Cyst on stomach  Neurological: Positive for light-headedness (occ) and headaches (occ).  Psychiatric/Behavioral: Positive for dysphoric mood (mild at times). The patient is not nervous/anxious.        Objective:   Vitals:   12/27/19 1053  BP: 132/74  Pulse: (!) 50  Temp: 98.2 F (36.8 C)  SpO2: 98%   Filed Weights   12/27/19 1053  Weight: 191 lb (86.6 kg)   Body mass index is 28.21 kg/m.  BP Readings from Last 3 Encounters:  12/27/19 132/74  12/07/19 (!) 166/78  12/05/19 138/86    Wt Readings from Last 3  Encounters:  12/27/19 191 lb (86.6 kg)  12/07/19  193 lb (87.5 kg)  12/05/19 193 lb (87.5 kg)     Physical Exam Constitutional: She appears well-developed and well-nourished. No distress.  HENT:  Head: Normocephalic and atraumatic.  Right Ear: External ear normal. Normal ear canal and TM Left Ear: External ear normal.  Normal ear canal and TM Mouth/Throat: Oropharynx is clear and moist.  Eyes: Conjunctivae and EOM are normal.  Neck: Neck supple. No tracheal deviation present. No thyromegaly present.  No carotid bruit  Cardiovascular: Normal rate, regular rhythm and normal heart sounds.   No murmur heard.  No edema. Pulmonary/Chest: Effort normal and breath sounds normal. No respiratory distress. She has no wheezes. She has no rales.  Breast: deferred   Abdominal: Soft. She exhibits no distension. There is no tenderness.  Lymphadenopathy: She has no cervical adenopathy.  Skin: Skin is warm and dry. She is not diaphoretic.  Healed infected follicle on abdomen Psychiatric: She has a normal mood and affect. Her behavior is normal.        Assessment & Plan:   Physical exam: Screening blood work    ordered Immunizations  Flu vac today, advised covid booster Colonoscopy  Up to date  Mammogram  Up to date  Dexa due-advised scheduling Eye exams  Up to date  Exercise none.  Encouraged regular exercise Weight okay for age Substance abuse none   Her daughter mentioned possible memory concerns.  Patient does not seem concerned and the daughter is unsure if they really want to evaluate further at this time or not.  Advised that they can think about it and let me know if they do want a referral to neuro psychology for further evaluation   See Problem List for Assessment and Plan of chronic medical problems.

## 2019-12-27 ENCOUNTER — Ambulatory Visit (INDEPENDENT_AMBULATORY_CARE_PROVIDER_SITE_OTHER): Payer: PPO | Admitting: Internal Medicine

## 2019-12-27 ENCOUNTER — Encounter: Payer: PPO | Admitting: Internal Medicine

## 2019-12-27 ENCOUNTER — Other Ambulatory Visit: Payer: Self-pay

## 2019-12-27 ENCOUNTER — Encounter: Payer: Self-pay | Admitting: Internal Medicine

## 2019-12-27 ENCOUNTER — Ambulatory Visit: Payer: PPO

## 2019-12-27 VITALS — BP 132/74 | HR 50 | Temp 98.2°F | Ht 69.0 in | Wt 191.0 lb

## 2019-12-27 DIAGNOSIS — K219 Gastro-esophageal reflux disease without esophagitis: Secondary | ICD-10-CM

## 2019-12-27 DIAGNOSIS — E7849 Other hyperlipidemia: Secondary | ICD-10-CM | POA: Diagnosis not present

## 2019-12-27 DIAGNOSIS — E1140 Type 2 diabetes mellitus with diabetic neuropathy, unspecified: Secondary | ICD-10-CM | POA: Diagnosis not present

## 2019-12-27 DIAGNOSIS — D6869 Other thrombophilia: Secondary | ICD-10-CM | POA: Diagnosis not present

## 2019-12-27 DIAGNOSIS — Z0000 Encounter for general adult medical examination without abnormal findings: Secondary | ICD-10-CM

## 2019-12-27 DIAGNOSIS — I10 Essential (primary) hypertension: Secondary | ICD-10-CM | POA: Diagnosis not present

## 2019-12-27 DIAGNOSIS — Z23 Encounter for immunization: Secondary | ICD-10-CM

## 2019-12-27 DIAGNOSIS — I480 Paroxysmal atrial fibrillation: Secondary | ICD-10-CM

## 2019-12-27 LAB — COMPREHENSIVE METABOLIC PANEL
ALT: 14 U/L (ref 0–35)
AST: 17 U/L (ref 0–37)
Albumin: 3.8 g/dL (ref 3.5–5.2)
Alkaline Phosphatase: 58 U/L (ref 39–117)
BUN: 10 mg/dL (ref 6–23)
CO2: 27 mEq/L (ref 19–32)
Calcium: 9.3 mg/dL (ref 8.4–10.5)
Chloride: 105 mEq/L (ref 96–112)
Creatinine, Ser: 0.95 mg/dL (ref 0.40–1.20)
GFR: 59.41 mL/min — ABNORMAL LOW (ref >60.00)
Glucose, Bld: 86 mg/dL (ref 70–99)
Potassium: 4.1 mEq/L (ref 3.5–5.1)
Sodium: 140 mEq/L (ref 135–145)
Total Bilirubin: 0.5 mg/dL (ref 0.2–1.2)
Total Protein: 7.5 g/dL (ref 6.0–8.3)

## 2019-12-27 LAB — CBC WITH DIFFERENTIAL/PLATELET
Basophils Absolute: 0 10*3/uL (ref 0.0–0.1)
Basophils Relative: 0.9 % (ref 0.0–3.0)
Eosinophils Absolute: 0 10*3/uL (ref 0.0–0.7)
Eosinophils Relative: 0.4 % (ref 0.0–5.0)
HCT: 36.7 % (ref 36.0–46.0)
Hemoglobin: 12.2 g/dL (ref 12.0–15.0)
Lymphocytes Relative: 30.6 % (ref 12.0–46.0)
Lymphs Abs: 1.1 10*3/uL (ref 0.7–4.0)
MCHC: 33.1 g/dL (ref 30.0–36.0)
MCV: 91 fl (ref 78.0–100.0)
Monocytes Absolute: 0.2 10*3/uL (ref 0.1–1.0)
Monocytes Relative: 6.8 % (ref 3.0–12.0)
Neutro Abs: 2.1 10*3/uL (ref 1.4–7.7)
Neutrophils Relative %: 61.3 % (ref 43.0–77.0)
Platelets: 204 10*3/uL (ref 150.0–400.0)
RBC: 4.03 Mil/uL (ref 3.87–5.11)
RDW: 14.1 % (ref 11.5–15.5)
WBC: 3.5 10*3/uL — ABNORMAL LOW (ref 4.0–10.5)

## 2019-12-27 LAB — LIPID PANEL
Cholesterol: 120 mg/dL (ref 0–200)
HDL: 46.7 mg/dL (ref >39.00)
LDL Cholesterol: 65 mg/dL (ref 0–99)
NonHDL: 73.61
Total CHOL/HDL Ratio: 3
Triglycerides: 41 mg/dL (ref 0.0–149.0)
VLDL: 8.2 mg/dL (ref 0.0–40.0)

## 2019-12-27 LAB — HEMOGLOBIN A1C: Hgb A1c MFr Bld: 6.3 % (ref 4.6–6.5)

## 2019-12-27 LAB — TSH: TSH: 1.74 u[IU]/mL (ref 0.35–4.50)

## 2019-12-27 NOTE — Assessment & Plan Note (Signed)
Chronic Check lipid panel, CMP Continue atorvastatin 10 mg daily

## 2019-12-27 NOTE — Assessment & Plan Note (Signed)
Chronic On Eliquis for paroxysmal atrial fibrillation

## 2019-12-27 NOTE — Addendum Note (Signed)
Addended by: Boris Lown B on: 12/27/2019 12:42 PM   Modules accepted: Orders

## 2019-12-27 NOTE — Assessment & Plan Note (Signed)
Chronic In sinus rhythm here today Following with cardiology On Eliquis, metoprolol Check CMP, CBC, TSH

## 2019-12-27 NOTE — Assessment & Plan Note (Signed)
Chronic Diet controlled Sugars very well controlled at home Check A1c

## 2019-12-27 NOTE — Assessment & Plan Note (Addendum)
Chronic Blood pressure well controlled here and at home Continue benazepril 40 mg daily, diltiazem 180 mg daily and metoprolol 25 mg twice daily CMP

## 2019-12-27 NOTE — Assessment & Plan Note (Signed)
Chronic GERD controlled Continue omeprazole 40 mg daily 

## 2020-01-03 NOTE — Progress Notes (Signed)
Cardiology Office Note    Date:  01/05/2020   ID:  Angela Simmons, Angela Simmons 09-10-1945, MRN [id number]  PCP:  Binnie Rail, MD  Cardiologist:  Carlyle Dolly, MD  Electrophysiologist:  None   Chief Complaint: f/u chest pain, cath  History of Present Illness:   Angela Simmons is a 74 y.o. female with history of PAF, HTN, dyslipidemia, NIDDM, breast CA, obesity, osteoarthritis, GERD who presents to f/u heart cath. She had chest pain earlier this year with unremarkable echo 04/2019 with EF 60-65%, mild LVH, mildly elevated PASP. She had nonischemic nuclear stress test in 05/2019. She saw Kerin Ransom PA-C 12/05/19 with reports of an episode of chest pain, and also similar symptoms with exertion. Due to this cardiac cath was performed with results below - left system was normal, unable to engage right system due to anatomy with recommendation to return for RCA angiography given the right femoral approach. Do not see this arranged.  She is seen back for follow-up today. She denies any further episodes of chest pain. She originally felt good but for the last 2 days she's noticed generalized fatigue and DOE. She is somewhat of a vague historian. + prior DOE in the past, but not lately, more specifically the last 2 days. She's unsure if it's related to all of the holiday activity. No palpitations. She reports that whenever she checks her BP while watching for the numbers she sometimes gets dizzy and faint. This only happens during BP checks. No URI symptoms or other acute illness stigmata.   Labwork independently reviewed: 12/2019 A1C 6.3, Hgb 12.2, K 4.1, Cr 0.95, LFTs wnl, LDL 65, Tchol 120, trig 41, TSH wnl  Past Medical History:  Diagnosis Date  . Breast cancer ([place]) 1990s   right breast  . Collagen vascular disease ([place])   . Diabetes mellitus without complication ([place])   . GERD (gastroesophageal reflux disease)   . HTN (hypertension)   . Hypercholesterolemia   . Obesity   .  Osteoarthritis     Past Surgical History:  Procedure Laterality Date  . ABDOMINAL HY[place]ERECTOMY  [place]   [place], [place]roids  . AORTIC ARCH ANGIOGRAPHY N/A 12/07/2019   Procedure: AORTIC ARCH ANGIOGRAPHY;  Surgeon: Lorretta Harp, MD;  Location: [facility];  Service: Cardiovascular;  Laterality: N/A;  . COLONOSCOPY  May 2003   Dr. Tamala Julian: normal  . COLONOSCOPY  06/22/2011   Procedure: COLONOSCOPY;  Surgeon: Danie Binder, MD;  [facility];  Service: Endoscopy;  Laterality: N/A;  10:30  . LEFT HEART CATH AND CORONARY ANGIOGRAPHY N/A 12/07/2019   Procedure: LEFT HEART CATH AND CORONARY ANGIOGRAPHY;  Surgeon: Lorretta Harp, MD;  Location: [facility];  Service: Cardiovascular;  Laterality: N/A;  . MASTECTOMY  1995   right     Current Medications: Current Meds  Medication Sig  . apixaban (ELIQUIS) 5 MG TABS tablet Take 1 tablet (5 mg total) by mouth 2 (two) times daily.  Marland Kitchen atorvastatin (LIPITOR) 10 MG tablet TAKE 1 TABLET BY MOUTH DAILY (Patient taking differently: Take 10 mg by mouth at bedtime.)  . benazepril (LOTENSIN) 40 MG tablet TAKE 1 TABLET BY MOUTH EVERY DAY (Patient taking differently: Take 40 mg by mouth daily.)  . Blood Glucose Monitoring Suppl (ONETOUCH VERIO) w/Device KIT USE TO TEST BLOOD SUGAR ONCE DAILY AS DIRECTED  . diclofenac sodium (VOLTAREN) 1 % GEL Apply 3 g to 3 large joints up to 3 times daily. (Patient taking  differently: Apply 3 g topically 3 (three) times daily as needed (pain). 3 large joints)  . diltiazem (CARDIZEM CD) 180 MG 24 hr capsule Take 1 capsule (180 mg total) by mouth daily.  . fluticasone (FLONASE) 50 MCG/ACT nasal spray USE 2 SPRAYS IN EACH NOSTRIL EVERY DAY (Patient taking differently: Place 2 sprays into both nostrils daily as needed for allergies.)  . Ginger, Zingiber officinalis, 550 MG CAPS Take 1 capsule (550 mg total) by mouth daily. (Patient taking differently: Take 550 mg by mouth daily.)  . glucose blood test strip  USE TO TEST BLOOD SUGAR ONCE DAILY. E11.40  . hydroxychloroquine (PLAQUENIL) 200 MG tablet TAKE 1 TABLET BY MOUTH ONCE DAILY ON MONDAY-FRIDAY (Patient taking differently: Take 200 mg by mouth See admin instructions. TAKE 200 mg ON MONDAY-FRIDAY)  . loratadine (CLARITIN) 10 MG tablet TAKE 1 TABLET BY MOUTH EVERY DAY (Patient taking differently: Take 10 mg by mouth daily.)  . metoprolol tartrate (LOPRESSOR) 25 MG tablet Take 1 tablet (25 mg total) by mouth 2 (two) times daily.  . Multiple Vitamins-Minerals (CENTRUM SILVER 50+WOMEN) TABS Take 1 tablet by mouth daily at 6 (six) AM.  . nitroGLYCERIN (NITROSTAT) 0.4 MG SL tablet Place 1 tablet (0.4 mg total) under the tongue every 5 (five) minutes as needed for chest pain.  . Omega-3 Fatty Acids (FISH OIL) 1000 MG CAPS Take 1,000 mg by mouth daily.  Marland Kitchen omeprazole (PRILOSEC) 40 MG capsule TAKE ONE CAPSULE BY MOUTH DAILY (Patient taking differently: Take 40 mg by mouth daily.)  . ONETOUCH DELICA LANCETS 12W MISC USE TO TEST ONCE DAILY  . ONETOUCH VERIO test strip USE TO TEST BLOOD SUGAR ONCE DAILY  . triamcinolone ointment (KENALOG) 0.5 % Apply 1 application topically 2 (two) times daily. Do not use for more than 14 days in a row (Patient taking differently: Apply 1 application topically daily as needed (Rash).)  . Turmeric (QC TUMERIC COMPLEX) 500 MG CAPS Take 1 capsule by mouth daily. (Patient taking differently: Take 500 mg by mouth daily.)      Allergies:   Levofloxacin, Nitrofurantoin, Penicillins, Sulfonamide derivatives, and Pantoprazole   Social History   Socioeconomic History  . Marital status: Married    Spouse name: Not on file  . Number of children: 3  . Years of education: 7  . Highest education level: Not on file  Occupational History  . Not on file  Tobacco Use  . Smoking status: Never Smoker  . Smokeless tobacco: Never Used  Vaping Use  . Vaping Use: Never used  Substance and Sexual Activity  . Alcohol use: No  . Drug use:  Never  . Sexual activity: Never    Birth control/protection: Rhythm  Other Topics Concern  . Not on file  Social History Narrative   Lives at home with husband   Caffeine, maybe 1 a day   Social Determinants of Health   Financial Resource Strain: Low Risk   . Difficulty of Paying Living Expenses: Not very hard  Food Insecurity: Not on file  Transportation Needs: Not on file  Physical Activity: Not on file  Stress: Not on file  Social Connections: Not on file     Family History:  The patient's \ family history includes Diabetes in her brother, brother, father, mother, sister, and sister; Emphysema in her father; Heart failure in her mother; Hypercholesterolemia in her brother, brother, sister, sister, sister, and sister; Hypertension in her brother, brother, and sister. There is no history of Colon cancer.  ROS:   Please see the history of present illness.  Trace sockline edema All other systems are reviewed and otherwise negative.    EKGs/Labs/Other Studies Reviewed:    Studies reviewed are outlined and summarized above. Reports included below if pertinent.  [facility] 12/07/19  IMPRESSION: Ms. Riepe has normal left coronary system.  She has an apparent takeoff RCA from close to the left main takeoff.  I was unable to selectively intubate this from the right radial approach.  I did use 180 cc of contrast and I aborted the procedure.  She will need to return for RCA angiography via the right femoral approach.  The sheath was removed and a TR band was placed on the right wrist to achieve patent hemostasis.  The patient left lab in stable condition.  She will be discharged home today as an outpatient.  Quay Burow. MD, [facility] 12/07/2019 12:22 PM  NST 05/2019  No diagnostic ST segment changes to indicate ischemia.  No significant myocardial perfusion defects to indicate scar or ischemia. There is apical breast attenuation artifact  This is a low risk study.  Nuclear stress  EF: 72%.     EKG:  EKG is ordered today, personally reviewed, demonstrating SB 51bpm, otherwise no acute STT changes  Recent Labs: 07/22/2019: B Natriuretic Peptide 74.3 07/25/2019: Magnesium 2.2 12/27/2019: ALT 14; BUN 10; Creatinine, Ser 0.95; Hemoglobin 12.2; Platelets 204.0; Potassium 4.1; Sodium 140; TSH 1.74  Recent Lipid Panel    Component Value Date/Time   CHOL 120 12/27/2019 1243   TRIG 41.0 12/27/2019 1243   HDL 46.70 12/27/2019 1243   CHOLHDL 3 12/27/2019 1243   VLDL 8.2 12/27/2019 1243   LDLCALC 65 12/27/2019 1243    PHYSICAL EXAM:    VS:  BP (!) 144/80   Pulse (!) 56   Ht 5' 9"  (1.753 m)   Wt 190 lb (86.2 kg)   SpO2 100%   BMI 28.06 kg/m   BMI: Body mass index is 28.06 kg/m.  GEN: Well nourished, well developed AAF in no acute distress HEENT: normocephalic, atraumatic Neck: no JVD, carotid bruits, or masses Cardiac: RRR; no murmurs, rubs, or gallops, mild sockline edema noted Respiratory:  clear to auscultation bilaterally, normal work of breathing GI: soft, nontender, nondistended, + BS MS: no deformity or atrophy Skin: warm and dry, no rash, right radial cath site without hematoma or ecchymosis; good pulse. Neuro:  Alert and Oriented x 3, Strength and sensation are intact, follows commands Psych: euthymic mood, full affect  Wt Readings from Last 3 Encounters:  01/05/20 190 lb (86.2 kg)  12/27/19 191 lb (86.6 kg)  12/07/19 193 lb (87.5 kg)     ASSESSMENT & PLAN:   1. Chest pain/SOB - chest pain resolved, now with DOE and fatigue for 2 days. She also has mild trace sockline edema which she states comes and goes. Reviewed with Dr. Harl Bowie given that she had partial cath in November. In the absence of recurrent chest pain, and given normal left side and normal stress test earlier this year, he would suggest holding off on proceeding with RCA angiography, reserving this only for refractory anginal sx. It is difficult to discern what is causing her mild DOE and  fatigue today. She is unsure if it is related to the stress of the holidays. She is not tachycardic tachypneic or hypoxic. These symptoms are new since her last PCP lab check. We'll repeat CBC, BMET, TSH, BNP today to ensure stable. She is noted to be bradycardic  at the low end on EKG, also by recent PCP OV as well. Per d/w Dr. Harl Bowie, we'll decrease diltiazem to 130m daily to see if this helps restore some of her endurance. Will plan 1 month follow-up.  2. Paroxysmal atrial fib, here with sinus bradycardia - with med plan as above. Unclear relationship of HR to the episodes she notices getting dizzy when she checks her BP. Almost sounds vagal in nature. Will follow symptoms with reduction in diltiazem.  3. Essential HTN - BP marginally elevated today. Would like to see what values are doing at home with our med change. Will have her trend her BP at home with reduction in diltiazem and submit those values in 1 week for our review. If HR is improved with lower dose of diltiazem but BP elevated, may consider switch from metoprolol to carvedilol 6.216mBID for similar HR impact but improved BP lowering.  4. Dyslipidemia - continue statin therapy, followed by PCP.  Disposition: F/u with APP in 1 month.  Medication Adjustments/Labs and Tests Ordered: Current medicines are reviewed at length with the patient today.  Concerns regarding medicines are outlined above. Medication changes, Labs and Tests ordered today are summarized above and listed in the Patient Instructions accessible in Encounters.    Signed, DaCharlie PitterPA-C  01/05/2020 2:49 PM    [place] [id number]: [phone] [phone]

## 2020-01-05 ENCOUNTER — Other Ambulatory Visit (HOSPITAL_COMMUNITY)
Admission: RE | Admit: 2020-01-05 | Discharge: 2020-01-05 | Disposition: A | Discharge status: Home / Self Care | Payer: PPO | Source: Ambulatory Visit | Attending: Physician Assistant | Admitting: Physician Assistant

## 2020-01-05 ENCOUNTER — Other Ambulatory Visit: Payer: Self-pay

## 2020-01-05 ENCOUNTER — Encounter: Payer: Self-pay | Admitting: Physician Assistant

## 2020-01-05 ENCOUNTER — Ambulatory Visit: Payer: PPO | Admitting: Physician Assistant

## 2020-01-05 VITALS — BP 144/80 | HR 56 | Ht 69.0 in | Wt 190.0 lb

## 2020-01-05 DIAGNOSIS — I10 Essential (primary) hypertension: Secondary | ICD-10-CM | POA: Diagnosis not present

## 2020-01-05 DIAGNOSIS — R079 Chest pain, unspecified: Secondary | ICD-10-CM

## 2020-01-05 DIAGNOSIS — I480 Paroxysmal atrial fibrillation: Secondary | ICD-10-CM

## 2020-01-05 DIAGNOSIS — E785 Hyperlipidemia, unspecified: Secondary | ICD-10-CM

## 2020-01-05 DIAGNOSIS — R0602 Shortness of breath: Secondary | ICD-10-CM

## 2020-01-05 DIAGNOSIS — R001 Bradycardia, unspecified: Secondary | ICD-10-CM | POA: Insufficient documentation

## 2020-01-05 LAB — CBC
HCT: 40.1 % (ref 36.0–46.0)
Hemoglobin: 12.5 g/dL (ref 12.0–15.0)
MCH: 29.9 pg (ref 26.0–34.0)
MCHC: 31.2 g/dL (ref 30.0–36.0)
MCV: 95.9 fL (ref 80.0–100.0)
Platelets: 181 10*3/uL (ref 150–400)
RBC: 4.18 MIL/uL (ref 3.87–5.11)
RDW: 14 % (ref 11.5–15.5)
WBC: 3.9 10*3/uL — ABNORMAL LOW (ref 4.0–10.5)
nRBC: 0 % (ref 0.0–0.2)

## 2020-01-05 LAB — TSH: TSH: 1.515 u[IU]/mL (ref 0.350–4.500)

## 2020-01-05 LAB — BASIC METABOLIC PANEL
Anion gap: 8 (ref 5–15)
BUN: 11 mg/dL (ref 8–23)
CO2: 23 mmol/L (ref 22–32)
Calcium: 9 mg/dL (ref 8.9–10.3)
Chloride: 106 mmol/L (ref 98–111)
Creatinine, Ser: 0.9 mg/dL (ref 0.44–1.00)
GFR, Estimated: >60 mL/min (ref >60)
Glucose, Bld: 91 mg/dL (ref 70–99)
Potassium: 3.6 mmol/L (ref 3.5–5.1)
Sodium: 137 mmol/L (ref 135–145)

## 2020-01-05 LAB — BRAIN NATRIURETIC PEPTIDE: B Natriuretic Peptide: 107 pg/mL — ABNORMAL HIGH (ref 0.0–100.0)

## 2020-01-05 MED ORDER — DILTIAZEM HCL ER COATED BEADS 120 MG PO CP24: 120.0000 mg | ORAL_CAPSULE | Freq: Every day | ORAL | 3 refills | Status: DC

## 2020-01-05 NOTE — Patient Instructions (Addendum)
Medication Instructions:  Your physician has recommended you make the following change in your medication:   Decrease Diltiazem to 120 mg Daily   *If you need a refill on your cardiac medications before your next appointment, please call your pharmacy*   Lab Work: Your physician recommends that you return for lab work in: Today   If you have labs (blood work) drawn today and your tests are completely normal, you will receive your results only by: Marland Kitchen MyChart Message (if you have MyChart) OR . A paper copy in the mail If you have any lab test that is abnormal or we need to change your treatment, we will call you to review the results.   Testing/Procedures: NONE   Follow-Up: At [facility], you and your health needs are our priority.  As part of our continuing mission to provide you with exceptional heart care, we have created designated Provider Care Teams.  These Care Teams include your primary Cardiologist (physician) and Advanced Practice Providers (APPs -  Physician Assistants and Nurse Practitioners) who all work together to provide you with the care you need, when you need it.  We recommend signing up for the patient portal called "MyChart".  Sign up information is provided on this After Visit Summary.  MyChart is used to connect with patients for Virtual Visits (Telemedicine).  Patients are able to view lab/test results, encounter notes, upcoming appointments, etc.  Non-urgent messages can be sent to your provider as well.   To learn more about what you can do with MyChart, go to [email or web address].    Your next appointment:   4 week(s)  The format for your next appointment:   In Person  Provider:   You will see one of the following Advanced Practice Providers on your designated Care Team:    Bernerd Pho, PA-C   Ermalinda Barrios, PA-C   Other Instructions Thank you for choosing [facility]!  Let's have you monitor your blood pressure at home. I would  recommend using a blood pressure cuff that goes on your arm. The wrist ones can be inaccurate. If possible, try to select one that also reports your heart rate. To check your blood pressure, choose a time at least 3 hours after taking your blood pressure medicines. If you can sample it at different times of the day, that's great - it might give you more information about how your blood pressure fluctuates. Remain seated in a chair for 5 minutes quietly beforehand, then check it. Please record a list of those readings and call us/send in MyChart message with them for our review in 1 week.

## 2020-01-10 DIAGNOSIS — Z79899 Other long term (current) drug therapy: Secondary | ICD-10-CM | POA: Diagnosis not present

## 2020-01-10 DIAGNOSIS — H40013 Open angle with borderline findings, low risk, bilateral: Secondary | ICD-10-CM | POA: Diagnosis not present

## 2020-01-10 DIAGNOSIS — E119 Type 2 diabetes mellitus without complications: Secondary | ICD-10-CM | POA: Diagnosis not present

## 2020-01-10 LAB — HM DIABETES EYE EXAM

## 2020-01-16 ENCOUNTER — Telehealth: Payer: Self-pay | Admitting: Physician Assistant

## 2020-01-16 NOTE — Telephone Encounter (Signed)
Thank you so much! Can we find out if Angela Simmons is feeling any better? When seen in the office recently she'd had 2-3 days of fatigue and some SOB. She had not had any further chest pain. She thought it might be all the holiday stress. We dropped her diltiazem dose because her HR was a little low.   If feeling better, would continue current med regimen and bring BP log to appointment next month.  If still experiencing same symptoms, would stop diltiazem altogether and submit blood pressure/HR trends in 1 week. If BP creeping up we may need to make other adjustments, but prefer to make one med change at at time. Fatigue can be a sign of many other things as well so may be worthwhile to see PCP as well.  Carletha Dawn PA-C

## 2020-01-16 NOTE — Telephone Encounter (Signed)
Great. Thank you.

## 2020-01-16 NOTE — Telephone Encounter (Signed)
I spoke with Angela Simmons. She is feeling much better. Her fatigue is resolved and has minimal SOB (notes some after bending and squatting) . She has c/o stiff and sore knees. She states she has an appointment to see a rheumatologist soon.

## 2020-01-16 NOTE — Telephone Encounter (Signed)
New message    Pt c/o BP issue: STAT if pt c/o blurred vision, one-sided weakness or slurred speech  1. What are your last 5 BP readings? 12/20 113/64 hr 55 12/21 124/80 hr 55 12/22 135/75 hr 56 Friday 132/72 52 Saturday 129/70 54 Sunday 131/74 53  Today 137/82 54

## 2020-01-16 NOTE — Telephone Encounter (Signed)
I will forward to D.Dunn, PA-C who had asked patient to call back with blood pressure recordings.

## 2020-01-17 ENCOUNTER — Other Ambulatory Visit: Payer: Self-pay

## 2020-01-17 MED ORDER — PREDNISONE 5 MG PO TABS: ORAL_TABLET | ORAL | 0 refills | Status: DC

## 2020-01-17 NOTE — Telephone Encounter (Signed)
Ok to send in a prednisone taper starting at 20 mg tapering by 5 mg every 2 days.

## 2020-01-17 NOTE — Telephone Encounter (Signed)
Patient called stating she had pain and swelling in both knees yesterday, 01/16/20.  Patient states she was doing a lot of walking and bending.  Patient states they are not warm to touch and doesn't notice any redness or discoloration.  Patient states the swelling has decreased today, but is worried they will swell back up once she starts moving again.  Patient is asking if there is something she can take for the inflammation.  Patient requested a return call.

## 2020-01-17 NOTE — Telephone Encounter (Signed)
Attempted to contact the patient and left message for patient to call the office.  

## 2020-01-30 ENCOUNTER — Telehealth: Payer: PPO

## 2020-01-30 ENCOUNTER — Telehealth: Payer: Self-pay | Admitting: Pharmacist

## 2020-01-30 NOTE — Telephone Encounter (Signed)
Chart review for recent OVs, medication changes: Attempted to call patient to discuss changes, left voicemail to call back.  Office Visits: 12/27/19 Dr Quay Burow OV: chronic f/u, CBC CMP lipids A1c and TSH stable, no med changes.  10/27/19 Dr Quay Burow OV: acute visit for disability forms  09/07/19 Dr Quay Burow OV: f/u BP, increased dilt to 240 mg. Later reduced to 180 mg due to dizziness.  08/02/19 Dr Quay Burow OV: hospital f/u. New Afib, continue metoprolol and Eliquis. Rx'd nystatin for thrush.  06/21/19 Dr Quay Burow OV: chronic f/u, burning sensation in groin, may consider gabapentin. UA negative, labs stable. No med changes.  Consult Visit: 01/05/20 PA Melina Copa (cardiology): f/u for chest pain, no further episodes. Reduced diltiazem to 120 mg. 12/07/19 [facility] - need to return for RCA angiography. 12/05/19 PA Kilroy (cardiology): eval for chest pain, plan for diagnostic cath. Add NTG prn, hold Eliquis x 3 doses prior to cath.   11/22/19 PA Hazel Sams (rheumatology): f/u for PMR. No med changes.  10/30/19 Dr Valeta Harms (pulmonary): f/u for pleural effusion and mediastinal adenopathy. No additional f/u needed.  10/16/19 Dr Harl Bowie (cardiology): added metoprolol 25 mg BID for aflutter sx.  09/04/19 Dr Harl Bowie (cardiology): stopped amlodipine and carvdilol d/t side effects, started diltiazem 120 mg. Later changed to dilt IR 30 mg TID d/t side effects.   08/23/19 ED visit: Afib - weakness, dizziness, SOB. EKG NSR, CXR normal, CT head normal. Added carvedilol 6.25 mg for rate control. This was later changed to dilt 120 mg d/t side effects.  08/11/19 NP Levell July (cardiology): hospital f/u, stopped metoprolol (pt not taking), continue Eliquis and BP meds.  07/22/19 Hospital admission: chest pain, tachycardia, admitted for CAP. CT chest possible asbestos exposure, referred to pulm. Afib w/ RVR requiring multiple doses of metoprolol and diltiazem. Started metoprolol 25 mg BID and Eliquis d/t CHADS2 of 4. Stopped aspirin d/t  Eliquis.

## 2020-01-30 NOTE — Chronic Care Management (AMB) (Deleted)
Chronic Care Management Pharmacy  Name: Angela Simmons  MRN: [id number] DOB: 1945/09/06   Chief Complaint/ HPI  Angela Simmons,  75 y.o. , female presents for their Follow-Up CCM visit with the clinical pharmacist via telephone due to COVID-19 Pandemic.  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 Goes to church, goes shopping, goes out with friends. Lives with husband and dog.   Office Visits: 12/27/19 Dr Quay Burow OV: chronic f/u, CBC CMP lipids A1c and TSH stable, no med changes.  10/27/19 Dr Quay Burow OV: acute visit for disability forms  09/07/19 Dr Quay Burow OV: f/u BP, increased dilt to 240 mg. Later reduced to 180 mg due to dizziness.  08/02/19 Dr Quay Burow OV: hospital f/u. New Afib, continue metoprolol and Eliquis. Rx'd nystatin for thrush.  06/21/19 Dr Quay Burow OV: chronic f/u, burning sensation in groin, may consider gabapentin. UA negative, labs stable. No med changes.  Consult Visit: 01/05/20 PA Melina Copa (cardiology): f/u for chest pain, no further episodes. Reduced diltiazem to 120 mg. 12/07/19 [facility] - need to return for RCA angiography. 12/05/19 PA Kilroy (cardiology): eval for chest pain, plan for diagnostic cath. Add NTG prn, hold Eliquis x 3 doses prior to cath.   11/22/19 PA Hazel Sams (rheumatology): f/u for PMR. No med changes.  10/30/19 Dr Valeta Harms (pulmonary): f/u for pleural effusion and mediastinal adenopathy. No  additional f/u needed.  10/16/19 Dr Harl Bowie (cardiology): added metoprolol 25 mg BID for aflutter sx.  09/04/19 Dr Harl Bowie (cardiology): stopped amlodipine and carvdilol d/t side effects, started diltiazem 120 mg. Later changed to dilt IR 30 mg TID d/t side effects.   08/23/19 ED visit: Afib - weakness, dizziness, SOB. EKG NSR, CXR normal, CT head normal. Added carvedilol 6.25 mg for rate control. This was later changed to dilt 120 mg d/t side effects.  08/11/19 NP Levell July (cardiology): hospital f/u, stopped metoprolol (pt not taking), continue Eliquis and BP meds.  07/22/19 Hospital admission: chest pain, tachycardia, admitted for CAP. CT chest possible asbestos exposure, referred to pulm. Afib w/ RVR requiring multiple doses of metoprolol and diltiazem. Started metoprolol 25 mg BID and Eliquis d/t CHADS2 of 4. Stopped aspirin d/t Eliquis.  07/20/19 ED visit [facility]): chest pain - determined to be GERD, ACS workup negative. Improved with antacids.  06/21/19 PA Hazel Sams (rheumatology): PMR f/u, condition stable. Rec'd exercises for bursitis, may refer to PT or cortisone injection if no improvement.  05/26/19 normal stress test, no clear cause of symptoms.  05/01/19 Dr Estanislado Pandy (rheumatology): f/u PMR. WBC low, reduce Plaquenil to 1 tablet M-F.  Allergies  Allergen Reactions  . Levofloxacin Hives and Itching  . Nitrofurantoin Hives and Itching  . Penicillins Hives and Itching  . Sulfonamide Derivatives Itching  . Pantoprazole Other (See Comments)    Bad dreams    Medications: Outpatient Encounter Medications as of 01/30/2020  Medication Sig  . apixaban (ELIQUIS) 5 MG TABS tablet Take 1 tablet (5 mg total) by mouth 2 (two) times daily.  Marland Kitchen atorvastatin (LIPITOR)  10 MG tablet TAKE 1 TABLET BY MOUTH DAILY (Patient taking differently: Take 10 mg by mouth at bedtime.)  . benazepril (LOTENSIN) 40 MG tablet TAKE 1 TABLET BY MOUTH EVERY DAY (Patient taking differently: Take 40 mg by mouth  daily.)  . Blood Glucose Monitoring Suppl (ONETOUCH VERIO) w/Device KIT USE TO TEST BLOOD SUGAR ONCE DAILY AS DIRECTED  . diclofenac sodium (VOLTAREN) 1 % GEL Apply 3 g to 3 large joints up to 3 times daily. (Patient taking differently: Apply 3 g topically 3 (three) times daily as needed (pain). 3 large joints)  . diltiazem (CARDIZEM CD) 120 MG 24 hr capsule Take 1 capsule (120 mg total) by mouth daily.  . fluticasone (FLONASE) 50 MCG/ACT nasal spray USE 2 SPRAYS IN EACH NOSTRIL EVERY DAY (Patient taking differently: Place 2 sprays into both nostrils daily as needed for allergies.)  . Ginger, Zingiber officinalis, 550 MG CAPS Take 1 capsule (550 mg total) by mouth daily. (Patient taking differently: Take 550 mg by mouth daily.)  . glucose blood test strip USE TO TEST BLOOD SUGAR ONCE DAILY. E11.40  . hydroxychloroquine (PLAQUENIL) 200 MG tablet TAKE 1 TABLET BY MOUTH ONCE DAILY ON MONDAY-FRIDAY (Patient taking differently: Take 200 mg by mouth See admin instructions. TAKE 200 mg ON MONDAY-FRIDAY)  . loratadine (CLARITIN) 10 MG tablet TAKE 1 TABLET BY MOUTH EVERY DAY (Patient taking differently: Take 10 mg by mouth daily.)  . metoprolol tartrate (LOPRESSOR) 25 MG tablet Take 1 tablet (25 mg total) by mouth 2 (two) times daily.  . Multiple Vitamins-Minerals (CENTRUM SILVER 50+WOMEN) TABS Take 1 tablet by mouth daily at 6 (six) AM.  . nitroGLYCERIN (NITROSTAT) 0.4 MG SL tablet Place 1 tablet (0.4 mg total) under the tongue every 5 (five) minutes as needed for chest pain.  . Omega-3 Fatty Acids (FISH OIL) 1000 MG CAPS Take 1,000 mg by mouth daily.  Marland Kitchen omeprazole (PRILOSEC) 40 MG capsule TAKE ONE CAPSULE BY MOUTH DAILY (Patient taking differently: Take 40 mg by mouth daily.)  . ONETOUCH DELICA LANCETS 34V MISC USE TO TEST ONCE DAILY  . ONETOUCH VERIO test strip USE TO TEST BLOOD SUGAR ONCE DAILY  . predniSONE (DELTASONE) 5 MG tablet Take 4 tabs po x 2 days, 3  tabs po x 2 days, 2  tabs po x 2 days, 1  tab  po x 2 days  . triamcinolone ointment (KENALOG) 0.5 % Apply 1 application topically 2 (two) times daily. Do not use for more than 14 days in a row (Patient taking differently: Apply 1 application topically daily as needed (Rash).)  . Turmeric (QC TUMERIC COMPLEX) 500 MG CAPS Take 1 capsule by mouth daily. (Patient taking differently: Take 500 mg by mouth daily.)   No facility-administered encounter medications on file as of 01/30/2020.   Lab Results  Component Value Date   CREATININE 0.90 01/05/2020   BUN 11 01/05/2020   GFR 59.41 (L) 12/27/2019   GFRNONAA >60 01/05/2020   GFRAA 72 11/07/2019   NA 137 01/05/2020   K 3.6 01/05/2020   CALCIUM 9.0 01/05/2020   CO2 23 01/05/2020   Wt Readings from Last 3 Encounters:  01/05/20 190 lb (86.2 kg)  12/27/19 191 lb (86.6 kg)  12/07/19 193 lb (87.5 kg)   Current Diagnosis/Assessment:    Goals Addressed   None     AFIB   New dx July 2021.  Patient is currently rate controlled. Office heart rates are  Pulse Readings from Last 3 Encounters:  01/05/20 Marland Kitchen)  56  12/27/19 (!) 50  12/07/19 (!) 57   CHA2DS2-VASc Score = 4  The patient's score is based upon: CHF History: No HTN History: Yes Diabetes History: Yes Stroke History: No Vascular Disease History: No  Patient has failed these meds in past: n/a Patient is currently controlled on the following medications:  Marland Kitchen Metoprolol tartrate 25 mg BID . Diltiazem CD 120 mg daily . Eliquis 5 mg BID  We discussed: what Afib is, increased risk of stroke with Afib, how medications work to reduce risks; bleeding risk with Eliquis; pt denies signs/sx of bleeding  Plan  Continue current medications  Hypertension   BP goal is:  <140/90  Office blood pressures are  BP Readings from Last 3 Encounters:  01/05/20 (!) 144/80  12/27/19 132/74  12/07/19 (!) 166/78   Patient checks BP at home daily Patient home BP readings are ranging:  8/6:160/100, 156/101, 132/69 (in pain that  day) 8/7: 144/87 8/8: 131/78 8/9: 138/92 8/10: 144/92 8/11: 133/96 8/12: 136/84 HR 65-80  Patient has failed these meds in the past: n/a Patient is currently controlled on the following medications:  . Amlodipine 5 mg daily AM . Benazepril 40 mg daily . Carvedilol 6.25 mg BID  We discussed BP goals; benefits of medications; pt denies side effects  Plan  Continue current medications   Hyperlipidemia   LDL goal < 100  Lipid Panel     Component Value Date/Time   CHOL 120 12/27/2019 1243   TRIG 41.0 12/27/2019 1243   HDL 46.70 12/27/2019 1243   LDLCALC 65 12/27/2019 1243    Hepatic Function Latest Ref Rng & Units 12/27/2019 11/07/2019 07/25/2019  Total Protein 6.0 - 8.3 g/dL 7.5 7.1 6.7  Albumin 3.5 - 5.2 g/dL 3.8 - 3.0(L)  AST 0 - 37 U/L 17 17 33  ALT 0 - 35 U/L 14 16 39  Alk Phosphatase 39 - 117 U/L 58 - 53  Total Bilirubin 0.2 - 1.2 mg/dL 0.5 0.5 0.6  Bilirubin, Direct 0.0 - 0.3 mg/dL - - -    The ASCVD Risk score ([place]., et al., 2013) failed to calculate for the following reasons:   The valid total cholesterol range is 130 to 320 mg/dL   Patient has failed these meds in past: n/a Patient is currently controlled on the following medications:  . Atorvastatin 10 mg daily HS . OTC Fish oil 1000 mg daily  We discussed:  Cholesterol goals; benefits of statin for ASCVD risk reduction; pt denies side effects  Plan  Continue current medications  Diabetes   A1c goal <7%  Recent Relevant Labs: Lab Results  Component Value Date/Time   HGBA1C 6.3 12/27/2019 12:43 PM   HGBA1C 6.0 06/21/2019 09:24 AM   GFR 59.41 (L) 12/27/2019 12:43 PM   GFR 75.21 06/21/2019 09:24 AM   MICROALBUR 1.1 02/26/2016 11:02 AM   MICROALBUR 0.5 11/23/2013 03:51 PM    Last diabetic Eye exam:  Lab Results  Component Value Date/Time   HMDIABEYEEXA No Retinopathy 01/09/2019 12:00 AM    Last diabetic Foot exam: No results found for: HMDIABFOOTEX   Checking BG: Daily Recent FBG  Readings: 90, 92 (always <100)  Patient has failed these meds in past: n/a Patient is currently controlled on the following medications: . No medications  We discussed: diet and exercise extensively  Plan  Continue control with diet and exercise  Polymyalgia Rheumatica   Patient has failed these meds in past: n/a Patient is currently controlled on the  following medications:  . Hydroxychloroquine 200 mg daily M-F . Diclofenac 1% gel TID . Folic acid 803 mcg daily  We discussed:  Pt has been on immunosuppressants for a few years now, denies issues. Pt reports she is not taking folic acid right now due to running out; encouraged her to renew her supply and start taking again  Plan  Continue current medications  Medication Management   Pt uses [facility] for all medications Uses pill box? Yes Pt endorses 100% compliance  We discussed: Discussed benefits of pill packaging and delivery with [facility]; pt is satisfied with [facility] and may pursue pill packaging there; pt reports medications are affordable, even Eliquis  Plan  Continue current medication management strategy    Follow up: *** month phone visit  Charlene Brooke, PharmD, BCACP Clinical Pharmacist [facility] [phone]

## 2020-01-30 NOTE — Progress Notes (Signed)
Chronic Care Management Pharmacy Assistant   Name: Angela Simmons  MRN: [id number] DOB: 07-06-1945  Reason for Encounter: Chart Review   PCP : Binnie Rail, MD  Allergies:   Allergies  Allergen Reactions  . Levofloxacin Hives and Itching  . Nitrofurantoin Hives and Itching  . Penicillins Hives and Itching  . Sulfonamide Derivatives Itching  . Pantoprazole Other (See Comments)    Bad dreams    Medications: Outpatient Encounter Medications as of 01/30/2020  Medication Sig  . apixaban (ELIQUIS) 5 MG TABS tablet Take 1 tablet (5 mg total) by mouth 2 (two) times daily.  Marland Kitchen atorvastatin (LIPITOR) 10 MG tablet TAKE 1 TABLET BY MOUTH DAILY (Patient taking differently: Take 10 mg by mouth at bedtime.)  . benazepril (LOTENSIN) 40 MG tablet TAKE 1 TABLET BY MOUTH EVERY DAY (Patient taking differently: Take 40 mg by mouth daily.)  . Blood Glucose Monitoring Suppl (ONETOUCH VERIO) w/Device KIT USE TO TEST BLOOD SUGAR ONCE DAILY AS DIRECTED  . diclofenac sodium (VOLTAREN) 1 % GEL Apply 3 g to 3 large joints up to 3 times daily. (Patient taking differently: Apply 3 g topically 3 (three) times daily as needed (pain). 3 large joints)  . diltiazem (CARDIZEM CD) 120 MG 24 hr capsule Take 1 capsule (120 mg total) by mouth daily.  . fluticasone (FLONASE) 50 MCG/ACT nasal spray USE 2 SPRAYS IN EACH NOSTRIL EVERY DAY (Patient taking differently: Place 2 sprays into both nostrils daily as needed for allergies.)  . Ginger, Zingiber officinalis, 550 MG CAPS Take 1 capsule (550 mg total) by mouth daily. (Patient taking differently: Take 550 mg by mouth daily.)  . glucose blood test strip USE TO TEST BLOOD SUGAR ONCE DAILY. E11.40  . hydroxychloroquine (PLAQUENIL) 200 MG tablet TAKE 1 TABLET BY MOUTH ONCE DAILY ON MONDAY-FRIDAY (Patient taking differently: Take 200 mg by mouth See admin instructions. TAKE 200 mg ON MONDAY-FRIDAY)  . loratadine (CLARITIN) 10 MG tablet TAKE 1 TABLET BY MOUTH EVERY DAY  (Patient taking differently: Take 10 mg by mouth daily.)  . metoprolol tartrate (LOPRESSOR) 25 MG tablet Take 1 tablet (25 mg total) by mouth 2 (two) times daily.  . Multiple Vitamins-Minerals (CENTRUM SILVER 50+WOMEN) TABS Take 1 tablet by mouth daily at 6 (six) AM.  . nitroGLYCERIN (NITROSTAT) 0.4 MG SL tablet Place 1 tablet (0.4 mg total) under the tongue every 5 (five) minutes as needed for chest pain.  . Omega-3 Fatty Acids (FISH OIL) 1000 MG CAPS Take 1,000 mg by mouth daily.  Marland Kitchen omeprazole (PRILOSEC) 40 MG capsule TAKE ONE CAPSULE BY MOUTH DAILY (Patient taking differently: Take 40 mg by mouth daily.)  . ONETOUCH DELICA LANCETS 40J MISC USE TO TEST ONCE DAILY  . ONETOUCH VERIO test strip USE TO TEST BLOOD SUGAR ONCE DAILY  . predniSONE (DELTASONE) 5 MG tablet Take 4 tabs po x 2 days, 3  tabs po x 2 days, 2  tabs po x 2 days, 1  tab po x 2 days  . triamcinolone ointment (KENALOG) 0.5 % Apply 1 application topically 2 (two) times daily. Do not use for more than 14 days in a row (Patient taking differently: Apply 1 application topically daily as needed (Rash).)  . Turmeric (QC TUMERIC COMPLEX) 500 MG CAPS Take 1 capsule by mouth daily. (Patient taking differently: Take 500 mg by mouth daily.)   No facility-administered encounter medications on file as of 01/30/2020.    Current Diagnosis: Patient Active Problem List   Diagnosis  Date Noted  . Secondary hypercoagulable state ([place]) 12/26/2019  . Anticoagulated 12/05/2019  . PAF (paroxysmal atrial fibrillation) ([place]) 08/04/2019  . Thrush 08/02/2019  . Anemia 08/02/2019  . Community acquired pneumonia 07/22/2019  . DOE (dyspnea on exertion) 04/03/2019  . Ankle swelling, left 04/03/2019  . Hyperpigmented skin lesion 04/03/2019  . Poor balance 12/06/2018  . Headache 12/06/2018  . Vertigo 06/07/2018  . Urinary frequency 06/07/2018  . Lower abdominal pain 06/07/2018  . Herpes zoster without complication 95/09/3265  . Tingling in extremities  11/26/2017  . Ischial bursitis of left side 06/02/2017  . Sicca syndrome ([place]) 06/23/2016  . Bilateral leg edema 04/07/2016  . Burning sensation of skin 04/07/2016  . PMR (polymyalgia rheumatica) ([facility]) 03/27/2016  . Osteopenia 03/04/2016  . Autoimmune disease ([place]) 03/18/2015  . Inflammatory arthritis 03/18/2015  . Joint pain 11/22/2014  . Overweight (BMI 25.0-29.9) 08/18/2014  . Low back pain with radiation 08/18/2014  . Hyperlipidemia 06/28/2014  . Chest pain with moderate risk of acute coronary syndrome 04/11/2014  . Tendonitis of shoulder 08/29/2013  . S/P right mastectomy 11/14/2012  . Type 2 diabetes, controlled, with neuropathy ([place]) 10/05/2012  . Allergic rhinitis 01/26/2011  . GERD 11/10/2009  . INTERSTITIAL CYSTITIS 10/31/2008  . BENIGN NEOPLASM OF VULVA 02/01/2008  . Essential hypertension, benign 02/08/2007  . History of cancer of right breast 02/08/2007    Goals Addressed   None     Follow-Up:  Pharmacist Review   Preformed a chart review.  Rosendo Gros, [facility]  Practice Team Manager/ CPA (Clinical Pharmacist Assistant) [phone]

## 2020-02-08 DIAGNOSIS — M79676 Pain in unspecified toe(s): Secondary | ICD-10-CM | POA: Diagnosis not present

## 2020-02-08 DIAGNOSIS — B351 Tinea unguium: Secondary | ICD-10-CM | POA: Diagnosis not present

## 2020-02-08 DIAGNOSIS — E1142 Type 2 diabetes mellitus with diabetic polyneuropathy: Secondary | ICD-10-CM | POA: Diagnosis not present

## 2020-02-08 DIAGNOSIS — L84 Corns and callosities: Secondary | ICD-10-CM | POA: Diagnosis not present

## 2020-02-12 ENCOUNTER — Other Ambulatory Visit (HOSPITAL_COMMUNITY): Payer: Self-pay | Admitting: Internal Medicine

## 2020-02-12 DIAGNOSIS — Z1231 Encounter for screening mammogram for malignant neoplasm of breast: Secondary | ICD-10-CM

## 2020-02-16 ENCOUNTER — Encounter: Payer: Self-pay | Admitting: Student

## 2020-02-16 ENCOUNTER — Ambulatory Visit: Payer: PPO | Admitting: Student

## 2020-02-16 ENCOUNTER — Other Ambulatory Visit: Payer: Self-pay

## 2020-02-16 VITALS — BP 124/78 | HR 80 | Ht 69.0 in | Wt 195.0 lb

## 2020-02-16 DIAGNOSIS — R079 Chest pain, unspecified: Secondary | ICD-10-CM | POA: Diagnosis not present

## 2020-02-16 DIAGNOSIS — I480 Paroxysmal atrial fibrillation: Secondary | ICD-10-CM | POA: Diagnosis not present

## 2020-02-16 DIAGNOSIS — I10 Essential (primary) hypertension: Secondary | ICD-10-CM | POA: Diagnosis not present

## 2020-02-16 DIAGNOSIS — E785 Hyperlipidemia, unspecified: Secondary | ICD-10-CM | POA: Diagnosis not present

## 2020-02-16 NOTE — Patient Instructions (Signed)
Medication Instructions:  Your physician recommends that you continue on your current medications as directed. Please refer to the Current Medication list given to you today.  *If you need a refill on your cardiac medications before your next appointment, please call your pharmacy*   Lab Work: None Today If you have labs (blood work) drawn today and your tests are completely normal, you will receive your results only by: . MyChart Message (if you have MyChart) OR . A paper copy in the mail If you have any lab test that is abnormal or we need to change your treatment, we will call you to review the results.   Testing/Procedures: None Today   Follow-Up: At [facility], you and your health needs are our priority.  As part of our continuing mission to provide you with exceptional heart care, we have created designated Provider Care Teams.  These Care Teams include your primary Cardiologist (physician) and Advanced Practice Providers (APPs -  Physician Assistants and Nurse Practitioners) who all work together to provide you with the care you need, when you need it.  We recommend signing up for the patient portal called "MyChart".  Sign up information is provided on this After Visit Summary.  MyChart is used to connect with patients for Virtual Visits (Telemedicine).  Patients are able to view lab/test results, encounter notes, upcoming appointments, etc.  Non-urgent messages can be sent to your provider as well.   To learn more about what you can do with MyChart, go to [email or web address].    Your next appointment:   6 month(s)  The format for your next appointment:   In Person  Provider:   Jonathan Branch, MD   Other Instructions None Today     

## 2020-02-16 NOTE — Progress Notes (Signed)
Cardiology Office Note    Date:  02/16/2020   ID:  Angela Simmons [birth year], MRN [id number]  PCP:  Binnie Rail, MD  Cardiologist: Carlyle Dolly, MD    Chief Complaint  Patient presents with  . Follow-up    4 week visit    History of Present Illness:    Angela Simmons is a 75 y.o. female with past medical history of paroxysmal atrial fibrillation, HTN, HLD and Type 2 DM who presents to the office today for 4-week follow-up.   She was last examined by Melina Copa, PA-C on 01/05/2020 and had recently undergone a cardiac catheterization due to chest pain. The left coronary system was normal but the right was unable to be engaged due to her anatomy and it was recommended she return for RCA angiography via a right femoral approach. At the time of her visit, she denied any recurrent chest pain. She did report generalized fatigue and dyspnea on exertion. Given no recurrent chest pain, a possible repeat cath was reviewed with Dr. Harl Bowie and he recommended holding off at that time given her prior normal NST and normal left system and only pursue if refractory anginal symptoms. She was bradycardiac, therefore Cardizem CD was reduced to 17m daily. Labs showed stable electrolytes, kidney function, Hgb and TSH. WBC was slightly low at 3.9 and she was informed to follow-up with her PCP. BNP was only mildly elevated at 107. She was continued on her current regimen.   In talking with the patient and her sister today, she reports overall doing well from a cardiac perspective since her last office visit. She has been keeping a BP/HR log at home and her SBP has been well controlled in the 110's to 120's and heart rate has been in the 50's to 60's. She reports her energy level is now back to baseline and she denies any dyspnea on exertion. No recent chest pain. She does experience occasional palpitations but no persistent symptoms or associated dizziness. No orthopnea, PND or lower extremity  edema.   Past Medical History:  Diagnosis Date  . Breast cancer ([place] 1990[place]   right breast  . Collagen vascular disease ([place]   . [place] mellitus without complication [place]   . GERD (gastroesophageal reflux disease)   . HTN (hypertension)   . Hypercholesterolemia   . Obesity   . Osteoarthritis     Past Surgical History:  Procedure Laterality Date  . ABDOMINAL [place]CT[place]Y  [place]  . AORTIC ARCH ANGIOGRAPHY N/A 12/07/2019   Procedure: AORTIC ARCH ANGIOGRAPHY;  Surgeon: BLorretta Harp MD;  [facility];  Service: Cardiovascular;  Laterality: N/A;  . COLONOSCOPY  May 2003   Dr. STamala Julian normal  . COLONOSCOPY  06/22/2011   Procedure: COLONOSCOPY;  Surgeon: SDanie Binder MD;  [facility];  Service: Endoscopy;  Laterality: N/A;  10:30  . LEFT HEART CATH AND CORONARY ANGIOGRAPHY N/A 12/07/2019   Procedure: LEFT HEART CATH AND CORONARY ANGIOGRAPHY;  Surgeon: BLorretta Harp MD;  [facility];  Service: Cardiovascular;  Laterality: N/A;  . MASTECTOMY  1995   right     Current Medications: Outpatient Medications Prior to Visit  Medication Sig Dispense Refill  . apixaban (ELIQUIS) 5 MG TABS tablet Take 1 tablet (5 mg total) by mouth 2 (two) times daily. 60 tablet 6  . atorvastatin (LIPITOR) 10 MG tablet TAKE 1 TABLET BY MOUTH DAILY (Patient taking differently: Take  10 mg by mouth at bedtime.) 90 tablet 1  . benazepril (LOTENSIN) 40 MG tablet TAKE 1 TABLET BY MOUTH EVERY DAY (Patient taking differently: Take 40 mg by mouth daily.) 90 tablet 1  . Blood Glucose Monitoring Suppl (ONETOUCH VERIO) w/Device KIT USE TO TEST BLOOD SUGAR ONCE DAILY AS DIRECTED 1 kit 0  . diclofenac sodium (VOLTAREN) 1 % GEL Apply 3 g to 3 large joints up to 3 times daily. (Patient taking differently: Apply 3 g topically 3 (three) times daily as needed (pain). 3 large joints) 3 Tube 3  . diltiazem (CARDIZEM CD) 120 MG 24 hr capsule Take 1 capsule (120  mg total) by mouth daily. 90 capsule 3  . fluticasone (FLONASE) 50 MCG/ACT nasal spray USE 2 SPRAYS IN EACH NOSTRIL EVERY DAY (Patient taking differently: Place 2 sprays into both nostrils daily as needed for allergies.) 48 g 0  . Ginger, Zingiber officinalis, 550 MG CAPS Take 1 capsule (550 mg total) by mouth daily. (Patient taking differently: Take 550 mg by mouth daily.) 60 capsule 0  . glucose blood test strip USE TO TEST BLOOD SUGAR ONCE DAILY. E11.40 100 each 3  . hydroxychloroquine (PLAQUENIL) 200 MG tablet TAKE 1 TABLET BY MOUTH ONCE DAILY ON MONDAY-FRIDAY (Patient taking differently: Take 200 mg by mouth See admin instructions. TAKE 200 mg ON MONDAY-FRIDAY) 60 tablet 0  . loratadine (CLARITIN) 10 MG tablet TAKE 1 TABLET BY MOUTH EVERY DAY (Patient taking differently: Take 10 mg by mouth daily.) 30 tablet 2  . metoprolol tartrate (LOPRESSOR) 25 MG tablet Take 1 tablet (25 mg total) by mouth 2 (two) times daily. 60 tablet 6  . Multiple Vitamins-Minerals (CENTRUM SILVER 50+WOMEN) TABS Take 1 tablet by mouth daily at 6 (six) AM.    . nitroGLYCERIN (NITROSTAT) 0.4 MG SL tablet Place 1 tablet (0.4 mg total) under the tongue every 5 (five) minutes as needed for chest pain. 25 tablet 3  . Omega-3 Fatty Acids (FISH OIL) 1000 MG CAPS Take 1,000 mg by mouth daily.    Marland Kitchen omeprazole (PRILOSEC) 40 MG capsule TAKE ONE CAPSULE BY MOUTH DAILY (Patient taking differently: Take 40 mg by mouth daily.) 90 capsule 3  . ONETOUCH DELICA LANCETS 32G MISC USE TO TEST ONCE DAILY 100 each 2  . ONETOUCH VERIO test strip USE TO TEST BLOOD SUGAR ONCE DAILY 100 each 0  . predniSONE (DELTASONE) 5 MG tablet Take 4 tabs po x 2 days, 3  tabs po x 2 days, 2  tabs po x 2 days, 1  tab po x 2 days 20 tablet 0  . triamcinolone ointment (KENALOG) 0.5 % Apply 1 application topically 2 (two) times daily. Do not use for more than 14 days in a row (Patient taking differently: Apply 1 application topically daily as needed (Rash).) 15 g 0   . Turmeric (QC TUMERIC COMPLEX) 500 MG CAPS Take 1 capsule by mouth daily. (Patient taking differently: Take 500 mg by mouth daily.)    . DILT-XR 120 MG 24 hr capsule Take 120 mg by mouth daily.     No facility-administered medications prior to visit.     Allergies:   Levofloxacin, Nitrofurantoin, Penicillins, Sulfonamide derivatives, and Pantoprazole   Social History   Socioeconomic History  . Marital status: Married    Spouse name: Not on file  . Number of children: 3  . Years of education: 7  . Highest education level: Not on file  Occupational History  . Not on file  Tobacco  Use  . Smoking status: Never Smoker  . Smokeless tobacco: Never Used  Vaping Use  . Vaping Use: Never used  Substance and Sexual Activity  . Alcohol use: No  . Drug use: Never  . Sexual activity: Never    Birth control/protection: Rhythm  Other Topics Concern  . Not on file  Social History Narrative   Lives at home with husband   Caffeine, maybe 1 a day   Social Determinants of Health   Financial Resource Strain: Low Risk   . Difficulty of Paying Living Expenses: Not very hard  Food Insecurity: Not on file  Transportation Needs: Not on file  Physical Activity: Not on file  Stress: Not on file  Social Connections: Not on file     Family History:  The patient's family history includes Diabetes in her brother, brother, father, mother, sister, and sister; Emphysema in her father; Heart failure in her mother; Hypercholesterolemia in her brother, brother, sister, sister, sister, and sister; Hypertension in her brother, brother, and sister.   Review of Systems:   Please see the history of present illness.     General:  No chills, fever, night sweats or weight changes.  Cardiovascular:  No chest pain, dyspnea on exertion, edema, orthopnea, paroxysmal nocturnal dyspnea. Positive for palpitations.  Dermatological: No rash, lesions/masses Respiratory: No cough, dyspnea Urologic: No hematuria,  dysuria Abdominal:   No nausea, vomiting, diarrhea, bright red blood per rectum, melena, or hematemesis Neurologic:  No visual changes, wkns, changes in mental status. All other systems reviewed and are otherwise negative except as noted above.   Physical Exam:    VS:  BP 124/78   Pulse 80   Ht 5' 9"  (1.753 m)   Wt 195 lb (88.5 kg)   SpO2 99%   BMI 28.80 kg/m    General: Well developed, well nourished,female appearing in no acute distress. Head: Normocephalic, atraumatic. Neck: No carotid bruits. JVD not elevated.  Lungs: Respirations regular and unlabored, without wheezes or rales.  Heart: Regular rate and rhythm. No S3 or S4.  No murmur, no rubs, or gallops appreciated. Abdomen: Appears non-distended. No obvious abdominal masses. Msk:  Strength and tone appear normal for age. No obvious joint deformities or effusions. Extremities: No clubbing or cyanosis. Trace ankle edema.  Distal pedal pulses are 2+ bilaterally. Neuro: Alert and oriented X 3. Moves all extremities spontaneously. No focal deficits noted. Psych:  Responds to questions appropriately with a normal affect. Skin: No rashes or lesions noted  Wt Readings from Last 3 Encounters:  02/16/20 195 lb (88.5 kg)  01/05/20 190 lb (86.2 kg)  12/27/19 191 lb (86.6 kg)     Studies/Labs Reviewed:   EKG:  EKG is not ordered today. EKG from 01/05/2020 is reviewed and shows sinus bradycardia, HR 51 with no acute ST abnormalities.   Recent Labs: 07/25/2019: Magnesium 2.2 12/27/2019: ALT 14 01/05/2020: B Natriuretic Peptide 107.0; BUN 11; Creatinine, Ser 0.90; Hemoglobin 12.5; Platelets 181; Potassium 3.6; Sodium 137; TSH 1.515   Lipid Panel    Component Value Date/Time   CHOL 120 12/27/2019 1243   TRIG 41.0 12/27/2019 1243   HDL 46.70 12/27/2019 1243   CHOLHDL 3 12/27/2019 1243   VLDL 8.2 12/27/2019 1243   LDLCALC 65 12/27/2019 1243    Additional studies/ records that were reviewed today include:   Echocardiogram:  04/2019 IMPRESSIONS    1. Left ventricular ejection fraction, by estimation, is 60 to 65%. The  left ventricle has normal function. The left  ventricle has no regional  wall motion abnormalities. There is mild left ventricular hypertrophy.  Left ventricular diastolic parameters  were normal.  2. Right ventricular systolic function is normal. The right ventricular  size is normal. There is mildly elevated pulmonary artery systolic  pressure.  3. The mitral valve is normal in structure. Trivial mitral valve  regurgitation. No evidence of mitral stenosis.  4. The aortic valve is tricuspid. Aortic valve regurgitation is not  visualized. No aortic stenosis is present.  5. The inferior vena cava is normal in size with greater than 50%  respiratory variability, suggesting right atrial pressure of 3 mmHg.   Comparison(s): Stress Echocardiogram done 11/03/15 showed an EF of 60%.    Cardiac Catheterization: 11/2019 IMPRESSION: Ms. Tesch has normal left coronary system.  She has an apparent takeoff RCA from close to the left main takeoff.  I was unable to selectively intubate this from the right radial approach.  I did use 180 cc of contrast and I aborted the procedure.  She will need to return for RCA angiography via the right femoral approach.  The sheath was removed and a TR band was placed on the right wrist to achieve patent hemostasis.  The patient left lab in stable condition.  She will be discharged home today as an outpatient.   Assessment:    1. PAF (paroxysmal atrial fibrillation) (HCC)   2. Chest pain of uncertain etiology   3. Essential hypertension   4. Hyperlipidemia LDL goal <70      Plan:   In order of problems listed above:  1. Paroxysmal Atrial Fibrillation - She does report occasional palpitations but denies any persistent symptoms, associated dizziness or presyncope.  Her heart rate has been in the 50's to 60's and she reports her energy level significantly  improved with dose reduction of Cardizem. Given that she now feels back to baseline, will continue Cardizem CD 120 mg daily and Lopressor 25 mg twice daily. I encouraged her to make [place] aware if she has any recurrent symptoms, as she may require further dose adjustment. - She denies any evidence of active bleeding. Remains on Eliquis 5 mg twice daily for anticoagulation.  2. History of chest pain - Recent cardiac catheterization in 11/2019 showed a normal left system as outlined above but the RCA was unable to be engaged. Medical therapy was recommended unless she had refractory symptoms. She says she would not wish to undergo a repeat cardiac catheterization so if she did have recurrent symptoms, would consider a Coronary CTA for evaluation of the RCA. - Given that she denies any recurrent angina, will continue with medical management. Remains on Atorvastatin 10 mg daily and Lopressor 25 mg twice daily. She is not on ASA given the need for anticoagulation.  3. HTN - BP is well controlled at 124/78 during today's visit and has also been well controlled when checked at home.  Continue current medication regimen with Benzapril 40 mg daily, Cardizem CD 120 mg daily and Lopressor 25 mg twice daily.  4. HLD - FLP in 12/2019 showed total cholesterol 120, triglycerides 41, HDL 46 and LDL 65. Continue Atorvastatin 10 mg daily.   Medication Adjustments/Labs and Tests Ordered: Current medicines are reviewed at length with the patient today.  Concerns regarding medicines are outlined above.  Medication changes, Labs and Tests ordered today are listed in the Patient Instructions below. Patient Instructions  Medication Instructions:  Your physician recommends that you continue on your current medications as directed. Please  refer to the Current Medication list given to you today.  *If you need a refill on your cardiac medications before your next appointment, please call your pharmacy*   Lab Work: None  Today If you have labs (blood work) drawn today and your tests are completely normal, you will receive your results only by: Marland Kitchen MyChart Message (if you have MyChart) OR . A paper copy in the mail If you have any lab test that is abnormal or we need to change your treatment, we will call you to review the results.   Testing/Procedures: None Today   Follow-Up: At [facility], you and your health needs are our priority.  As part of our continuing mission to provide you with exceptional heart care, we have created designated Provider Care Teams.  These Care Teams include your primary Cardiologist (physician) and Advanced Practice Providers (APPs -  Physician Assistants and Nurse Practitioners) who all work together to provide you with the care you need, when you need it.  We recommend signing up for the patient portal called "MyChart".  Sign up information is provided on this After Visit Summary.  MyChart is used to connect with patients for Virtual Visits (Telemedicine).  Patients are able to view lab/test results, encounter notes, upcoming appointments, etc.  Non-urgent messages can be sent to your provider as well.   To learn more about what you can do with MyChart, go to [email or web address].    Your next appointment:   6 month(s)  The format for your next appointment:   In Person  Provider:   Carlyle Dolly, MD   Other Instructions None Today        Signed, Angela Heritage, PA-C  02/16/2020 1:49 PM    [place] [address] [place], [place] [address] Phone: [phone] Fax: [phone]

## 2020-02-23 DIAGNOSIS — N907 Vulvar cyst: Secondary | ICD-10-CM | POA: Diagnosis not present

## 2020-02-26 ENCOUNTER — Telehealth: Payer: Self-pay | Admitting: Pharmacist

## 2020-02-26 NOTE — Progress Notes (Signed)
Chronic Care Management Pharmacy Assistant   Name: Angela Simmons  MRN: [id number] DOB: 1945/03/26  Reason for Encounter: Hypertension Adherence Call   PCP : Binnie Rail, MD  Allergies:   Allergies  Allergen Reactions  . Levofloxacin Hives and Itching  . Nitrofurantoin Hives and Itching  . Penicillins Hives and Itching  . Sulfonamide Derivatives Itching  . Pantoprazole Other (See Comments)    Bad dreams    Medications: Outpatient Encounter Medications as of 02/26/2020  Medication Sig  . apixaban (ELIQUIS) 5 MG TABS tablet Take 1 tablet (5 mg total) by mouth 2 (two) times daily.  Marland Kitchen atorvastatin (LIPITOR) 10 MG tablet TAKE 1 TABLET BY MOUTH DAILY (Patient taking differently: Take 10 mg by mouth at bedtime.)  . benazepril (LOTENSIN) 40 MG tablet TAKE 1 TABLET BY MOUTH EVERY DAY (Patient taking differently: Take 40 mg by mouth daily.)  . Blood Glucose Monitoring Suppl (ONETOUCH VERIO) w/Device KIT USE TO TEST BLOOD SUGAR ONCE DAILY AS DIRECTED  . diclofenac sodium (VOLTAREN) 1 % GEL Apply 3 g to 3 large joints up to 3 times daily. (Patient taking differently: Apply 3 g topically 3 (three) times daily as needed (pain). 3 large joints)  . diltiazem (CARDIZEM CD) 120 MG 24 hr capsule Take 1 capsule (120 mg total) by mouth daily.  . fluticasone (FLONASE) 50 MCG/ACT nasal spray USE 2 SPRAYS IN EACH NOSTRIL EVERY DAY (Patient taking differently: Place 2 sprays into both nostrils daily as needed for allergies.)  . Ginger, Zingiber officinalis, 550 MG CAPS Take 1 capsule (550 mg total) by mouth daily. (Patient taking differently: Take 550 mg by mouth daily.)  . glucose blood test strip USE TO TEST BLOOD SUGAR ONCE DAILY. E11.40  . hydroxychloroquine (PLAQUENIL) 200 MG tablet TAKE 1 TABLET BY MOUTH ONCE DAILY ON MONDAY-FRIDAY (Patient taking differently: Take 200 mg by mouth See admin instructions. TAKE 200 mg ON MONDAY-FRIDAY)  . loratadine (CLARITIN) 10 MG tablet TAKE 1 TABLET BY MOUTH  EVERY DAY (Patient taking differently: Take 10 mg by mouth daily.)  . metoprolol tartrate (LOPRESSOR) 25 MG tablet Take 1 tablet (25 mg total) by mouth 2 (two) times daily.  . Multiple Vitamins-Minerals (CENTRUM SILVER 50+WOMEN) TABS Take 1 tablet by mouth daily at 6 (six) AM.  . nitroGLYCERIN (NITROSTAT) 0.4 MG SL tablet Place 1 tablet (0.4 mg total) under the tongue every 5 (five) minutes as needed for chest pain.  . Omega-3 Fatty Acids (FISH OIL) 1000 MG CAPS Take 1,000 mg by mouth daily.  Marland Kitchen omeprazole (PRILOSEC) 40 MG capsule TAKE ONE CAPSULE BY MOUTH DAILY (Patient taking differently: Take 40 mg by mouth daily.)  . ONETOUCH DELICA LANCETS 53P MISC USE TO TEST ONCE DAILY  . ONETOUCH VERIO test strip USE TO TEST BLOOD SUGAR ONCE DAILY  . predniSONE (DELTASONE) 5 MG tablet Take 4 tabs po x 2 days, 3  tabs po x 2 days, 2  tabs po x 2 days, 1  tab po x 2 days  . triamcinolone ointment (KENALOG) 0.5 % Apply 1 application topically 2 (two) times daily. Do not use for more than 14 days in a row (Patient taking differently: Apply 1 application topically daily as needed (Rash).)  . Turmeric (QC TUMERIC COMPLEX) 500 MG CAPS Take 1 capsule by mouth daily. (Patient taking differently: Take 500 mg by mouth daily.)   No facility-administered encounter medications on file as of 02/26/2020.    Current Diagnosis: Patient Active Problem List  Diagnosis Date Noted  . Secondary hypercoagulable state ([place]) 12/26/2019  . Anticoagulated 12/05/2019  . PAF (paroxysmal atrial fibrillation) ([place]) 08/04/2019  . Thrush 08/02/2019  . Anemia 08/02/2019  . Community acquired pneumonia 07/22/2019  . DOE (dyspnea on exertion) 04/03/2019  . Ankle swelling, left 04/03/2019  . Hyperpigmented skin lesion 04/03/2019  . Poor balance 12/06/2018  . Headache 12/06/2018  . Vertigo 06/07/2018  . Urinary frequency 06/07/2018  . Lower abdominal pain 06/07/2018  . Herpes zoster without complication 28/31/5176  . Tingling in  extremities 11/26/2017  . Ischial bursitis of left side 06/02/2017  . Sicca syndrome ([place]) 06/23/2016  . Bilateral leg edema 04/07/2016  . Burning sensation of skin 04/07/2016  . PMR (polymyalgia rheumatica) (HCC) 03/27/2016  . Osteopenia 03/04/2016  . Autoimmune disease ([place]) 03/18/2015  . Inflammatory arthritis 03/18/2015  . Joint pain 11/22/2014  . Overweight (BMI 25.0-29.9) 08/18/2014  . Low back pain with radiation 08/18/2014  . Hyperlipidemia 06/28/2014  . Chest pain with moderate risk of acute coronary syndrome 04/11/2014  . Tendonitis of shoulder 08/29/2013  . S/P right mastectomy 11/14/2012  . Type 2 diabetes, controlled, with neuropathy ([place]) 10/05/2012  . Allergic rhinitis 01/26/2011  . GERD 11/10/2009  . INTERSTITIAL CYSTITIS 10/31/2008  . BENIGN NEOPLASM OF VULVA 02/01/2008  . Essential hypertension, benign 02/08/2007  . History of cancer of right breast 02/08/2007    Goals Addressed   None     Follow-Up:  Pharmacist Review    Reviewed chart prior to disease state call. Spoke with patient regarding BP  Recent Office Vitals: BP Readings from Last 3 Encounters:  02/16/20 124/78  01/05/20 (!) 144/80  12/27/19 132/74   Pulse Readings from Last 3 Encounters:  02/16/20 80  01/05/20 (!) 56  12/27/19 (!) 50    Wt Readings from Last 3 Encounters:  02/16/20 195 lb (88.5 kg)  01/05/20 190 lb (86.2 kg)  12/27/19 191 lb (86.6 kg)     Kidney Function Lab Results  Component Value Date/Time   CREATININE 0.90 01/05/2020 03:23 PM   CREATININE 0.95 12/27/2019 12:43 PM   CREATININE 0.92 11/07/2019 10:30 AM   CREATININE 1.14 (H) 08/02/2019 03:16 PM   GFR 59.41 (L) 12/27/2019 12:43 PM   GFRNONAA >60 01/05/2020 03:23 PM   GFRNONAA 62 11/07/2019 10:30 AM   GFRAA 72 11/07/2019 10:30 AM    BMP Latest Ref Rng & Units 01/05/2020 12/27/2019 12/07/2019  Glucose 70 - 99 mg/dL 91 86 109(H)  BUN 8 - 23 mg/dL 11 10 11   Creatinine 0.44 - 1.00 mg/dL 0.90 0.95 1.06(H)   BUN/Creat Ratio 6 - 22 (calc) - - -  Sodium 135 - 145 mmol/L 137 140 142  Potassium 3.5 - 5.1 mmol/L 3.6 4.1 4.4  Chloride 98 - 111 mmol/L 106 105 106  CO2 22 - 32 mmol/L 23 27 25   Calcium 8.9 - 10.3 mg/dL 9.0 9.3 9.5    . Current antihypertensive regimen: The patient states that she is taking benazepril, diltiazem and metoprolol  . How often are you checking your Blood Pressure? The patient states that she use to takes her blood pressure three times daily, but was told that she didn't have to so now she takes once in the morning  . Current home BP readings: The patient home reading for 02/16/20 was 133/85, and for 02/19/20 was 129/77  . What recent interventions/DTPs have been made by any provider to improve Blood Pressure control since last CPP Visit: The patient states that she was  asked to keep a log of blood pressure reading and to try to eat portion sizes and to cut down on salt intake  . Any recent hospitalizations or ED visits since last visit with CPP? Patient last went to ED because she was having chest pain and given nitroglycerin but has not had to take   . What diet changes have been made to improve Blood Pressure Control? The patient states she tries to eat portion sizes and to cut down on salt intake   . What exercise is being done to improve your Blood Pressure Control? The patient states that she is very active around the house  Adherence Review: Is the patient currently on ACE/ARB medication? Yes, benazepril Does the patient have >5 day gap between last estimated fill dates? No   [name], [place] [phone]

## 2020-02-27 ENCOUNTER — Other Ambulatory Visit: Payer: Self-pay | Admitting: Physician Assistant

## 2020-02-27 NOTE — Telephone Encounter (Signed)
Last Visit: 11/22/2019 Next Visit: 04/23/2020 Labs: 01/05/2020, WBC 3.9, Brain Natriuretic peptide 107.0 Eye exam: 01/10/2020 WNL  Current Dose per office note 11/22/2019, Plaquenil 200 mg 1 tablet by mouth daily Monday to Friday DX: Autoimmune disease   Last Fill: 11/28/2019  Okay to refill Plaquenil?

## 2020-03-05 ENCOUNTER — Other Ambulatory Visit: Payer: Self-pay | Admitting: Internal Medicine

## 2020-03-07 ENCOUNTER — Telehealth: Payer: Self-pay | Admitting: Internal Medicine

## 2020-03-07 NOTE — Telephone Encounter (Signed)
   Please return call to patient to discuss AWV options

## 2020-03-12 ENCOUNTER — Telehealth: Payer: Self-pay | Admitting: Student

## 2020-03-12 NOTE — Telephone Encounter (Signed)
    Her HR and BP appear well-controlled following dose adjustment of her medications. I would not think of those medications as causing abnormal dreams but she can try taking Benazepril in the morning if she wants to try to see if it helps. Her Lopressor and Eliquis are twice daily medications and it is best to take statins at night.   Signed, Erma Heritage, PA-C 03/12/2020, 5:14 PM

## 2020-03-12 NOTE — Telephone Encounter (Signed)
Patient called to give [place] her BP results and Oxygen levels for yesterday and today.  2/21 (Monday) 131/75  68 taken around 7am 2/22 (Tuesday) 114/75  63 also around 7am  She said she has been taking metoprolol tartrate, benazepril (LOTENSIN), apixaban (ELIQUIS) and atorvastatin (LIPITOR) at night before bed. She said she is waking up with head aches and also having dreams that cause her to wake up.

## 2020-03-12 NOTE — Telephone Encounter (Signed)
Spoke with pt who reports BP and Hr as recorded. Pt states that she feels fine. She is c/o headaches the last 2 mornings and that she is starting to have dreams at night which she normally does not do.

## 2020-03-13 NOTE — Telephone Encounter (Signed)
Pt notified and voiced understanding 

## 2020-03-15 ENCOUNTER — Ambulatory Visit: Payer: PPO

## 2020-03-20 ENCOUNTER — Other Ambulatory Visit: Payer: Self-pay

## 2020-03-20 ENCOUNTER — Ambulatory Visit (HOSPITAL_COMMUNITY)
Admission: RE | Admit: 2020-03-20 | Discharge: 2020-03-20 | Disposition: A | Discharge status: Home / Self Care | Payer: PPO | Source: Ambulatory Visit | Attending: Internal Medicine | Admitting: Internal Medicine

## 2020-03-20 DIAGNOSIS — Z1231 Encounter for screening mammogram for malignant neoplasm of breast: Secondary | ICD-10-CM | POA: Insufficient documentation

## 2020-03-22 ENCOUNTER — Ambulatory Visit (HOSPITAL_COMMUNITY): Payer: PPO

## 2020-03-22 ENCOUNTER — Telehealth: Payer: Self-pay

## 2020-03-22 NOTE — Telephone Encounter (Signed)
Patient called requesting a return call to let her know if she needs to continue taking Plaquenil.  Patient states the last prescription she received was on 02/09/20 which had 20 tablets.  Patient states she had an old bottle which still had tablets remaining which she has been taking.

## 2020-03-22 NOTE — Telephone Encounter (Signed)
Patient was prescribed Prednisone in November. Patient was given a total of 20 tabs. Patient advised once Prednisone was completed she did not need to continue ut as it was a taper. Patient advised she should continue the PLQ as prescribed. Patient advised a prescription was sent to the [facility] in February 2022. Patient expressed understanding.

## 2020-04-09 NOTE — Progress Notes (Signed)
Office Visit Note  Patient: Angela Simmons             Date of Birth: 02/13/1945           MRN: [id number]             PCP: Binnie Rail, MD Referring: Binnie Rail, MD Visit Date: 04/23/2020 Occupation: @GUAROCC @  Subjective:  Other (Right knee pain/swelling)   History of Present Illness: Angela Simmons is a 75 y.o. female with a history of Sjogren's and polymyalgia rheumatica.  She states she has been having pain and discomfort in her knee joints for the last couple of months.  She believes that her right knee joint is swollen and painful.  She denies any recurrence of polymyalgia rheumatica symptoms.  She continues to have dry mouth and dry eyes.  There is no history of oral ulcers, nasal ulcers, malar rash, photosensitivity, Raynaud's phenomenon or lymphadenopathy.  She continues to have some discomfort in the trochanteric region.  Activities of Daily Living:  Patient reports morning stiffness for 15-20 minutes.   Patient Denies nocturnal pain.  Difficulty dressing/grooming: Denies Difficulty climbing stairs: Reports Difficulty getting out of chair: Reports Difficulty using hands for taps, buttons, cutlery, and/or writing: Denies  Review of Systems  Constitutional: Positive for fatigue.  HENT: Positive for mouth dryness and nose dryness. Negative for mouth sores.   Eyes: Positive for dryness. Negative for pain and itching.  Respiratory: Negative for shortness of breath and difficulty breathing.   Cardiovascular: Positive for chest pain. Negative for palpitations.       Cardio w/u negative per patient  Gastrointestinal: Positive for constipation. Negative for blood in stool and diarrhea.  Endocrine: Negative for increased urination.  Genitourinary: Negative for difficulty urinating.  Musculoskeletal: Positive for arthralgias, joint pain, myalgias, morning stiffness, muscle tenderness and myalgias. Negative for joint swelling and muscle weakness.  Skin: Negative for color  change, rash, redness and sensitivity to sunlight.  Allergic/Immunologic: Negative for susceptible to infections.  Neurological: Positive for dizziness and headaches. Negative for numbness and memory loss.  Hematological: Positive for bruising/bleeding tendency. Negative for swollen glands.  Psychiatric/Behavioral: Negative for confusion.    PMFS History:  Patient Active Problem List   Diagnosis Date Noted  . Secondary hypercoagulable state ([place]) 12/26/2019  . Anticoagulated 12/05/2019  . PAF (paroxysmal atrial fibrillation) ([place]) 08/04/2019  . Thrush 08/02/2019  . Anemia 08/02/2019  . Community acquired pneumonia 07/22/2019  . DOE (dyspnea on exertion) 04/03/2019  . Ankle swelling, left 04/03/2019  . Hyperpigmented skin lesion 04/03/2019  . Poor balance 12/06/2018  . Headache 12/06/2018  . Vertigo 06/07/2018  . Urinary frequency 06/07/2018  . Lower abdominal pain 06/07/2018  . Herpes zoster without complication 65/46/5035  . Tingling in extremities 11/26/2017  . Ischial bursitis of left side 06/02/2017  . Sicca syndrome ([place]) 06/23/2016  . Bilateral leg edema 04/07/2016  . Burning sensation of skin 04/07/2016  . PMR (polymyalgia rheumatica) (HCC) 03/27/2016  . Osteopenia 03/04/2016  . Autoimmune disease ([place]) 03/18/2015  . Inflammatory arthritis 03/18/2015  . Joint pain 11/22/2014  . Overweight (BMI 25.0-29.9) 08/18/2014  . Low back pain with radiation 08/18/2014  . Hyperlipidemia 06/28/2014  . Chest pain with moderate risk of acute coronary syndrome 04/11/2014  . Tendonitis of shoulder 08/29/2013  . S/P right mastectomy 11/14/2012  . Type 2 diabetes, controlled, with neuropathy ([place]) 10/05/2012  . Allergic rhinitis 01/26/2011  . GERD 11/10/2009  . INTERSTITIAL CYSTITIS 10/31/2008  . BENIGN NEOPLASM  OF VULVA 02/01/2008  . Essential hypertension, benign 02/08/2007  . History of cancer of right breast 02/08/2007    Past Medical History:  Diagnosis Date  . Breast  cancer ([place]) 1990s   right breast  . Collagen vascular disease ([place])   . Diabetes mellitus without complication ([place])   . GERD (gastroesophageal reflux disease)   . HTN (hypertension)   . Hypercholesterolemia   . Obesity   . Osteoarthritis     Family History  Problem Relation Age of Onset  . Diabetes Mother        diabetic coma  . Heart failure Mother   . Diabetes Father   . Emphysema Father   . Diabetes Sister   . Hypercholesterolemia Sister   . Hypercholesterolemia Brother   . Diabetes Brother   . Hypertension Brother   . Hypercholesterolemia Brother   . Diabetes Sister   . Hypertension Sister   . Hypercholesterolemia Sister   . Hypercholesterolemia Sister   . Hypercholesterolemia Sister   . Diabetes Brother   . Hypertension Brother   . Colon cancer Neg Hx    Past Surgical History:  Procedure Laterality Date  . ABDOMINAL HY[place]CTOMY  [place]  . AORTIC ARCH ANGIOGRAPHY N/A 12/07/2019   Procedure: AORTIC ARCH ANGIOGRAPHY;  Surgeon: Lorretta Harp, MD;  Location: [facility];  Service: Cardiovascular;  Laterality: N/A;  . COLONOSCOPY  May 2003   Dr. Tamala Julian: normal  . COLONOSCOPY  06/22/2011   Procedure: COLONOSCOPY;  Surgeon: Danie Binder, MD;  Location[facility];  Service: Endoscopy;  Laterality: N/A;  10:30  . LEFT HEART CATH AND CORONARY ANGIOGRAPHY N/A 12/07/2019   Procedure: LEFT HEART CATH AND CORONARY ANGIOGRAPHY;  Surgeon: Lorretta Harp, MD;  Location: [facility];  Service: Cardiovascular;  Laterality: N/A;  . MASTECTOMY  1995   right    Social History   Social History Narrative   Lives at home with husband   Caffeine, maybe 1 a day   Immunization History  Administered Date(s) Administered  . Fluad Quad(high Dose 65+) 12/06/2018, 12/27/2019  . Influenza Split 11/14/2010  . Influenza Whole 10/18/2006, 10/31/2008, 11/06/2009  . Influenza, High Dose Seasonal PF 11/22/2014, 02/21/2016, 12/18/2016, 11/05/2017  .  Influenza,inj,Quad PF,6+ Mos 11/14/2012, 11/23/2013  . Moderna Sars-Covid-2 Vaccination 03/16/2019, 04/13/2019  . Pneumococcal Conjugate-13 04/11/2014  . Pneumococcal Polysaccharide-23 09/17/2011  . Td 07/05/2003  . Zoster 06/15/2013  . Zoster Recombinat (Shingrix) 11/30/2017, 02/02/2018     Objective: Vital Signs: BP (!) 180/83 (BP Location: Left Arm, Patient Position: Sitting, Cuff Size: Normal)   Pulse 60   Resp 14   Ht 5\' 9"  (1.753 m)   Wt 195 lb 6.4 oz (88.6 kg)   BMI 28.86 kg/m    Physical Exam Vitals and nursing note reviewed.  Constitutional:      Appearance: She is well-developed.  HENT:     Head: Normocephalic and atraumatic.  Eyes:     Conjunctiva/sclera: Conjunctivae normal.  Cardiovascular:     Rate and Rhythm: Normal rate and regular rhythm.     Heart sounds: Normal heart sounds.  Pulmonary:     Effort: Pulmonary effort is normal.     Breath sounds: Normal breath sounds.  Abdominal:     General: Bowel sounds are normal.     Palpations: Abdomen is soft.  Musculoskeletal:     Cervical back: Normal range of motion.  Lymphadenopathy:     Cervical: No cervical adenopathy.  Skin:    General: Skin is warm and dry.     Capillary Refill: Capillary refill takes less than 2 seconds.  Neurological:     Mental Status: She is alert and oriented to person, place, and time.  Psychiatric:        Behavior: Behavior normal.      Musculoskeletal Exam: C-spine was in good range of motion.  Shoulder joints, elbow joints, wrist joints were in good range of motion.  She has DIP and PIP thickening with no synovitis.  Hip joints with good range of motion.  She had discomfort with range of motion of bilateral knee joints without any warmth swelling or effusion.  There was no tenderness over ankles or MTPs.  CDAI Exam: CDAI Score: -- Patient Global: --; Provider Global: -- Swollen: --; Tender: -- Joint Exam 04/23/2020   No joint exam has been documented for this visit    There is currently no information documented on the homunculus. Go to the Rheumatology activity and complete the homunculus joint exam.  Investigation: No additional findings.  Imaging: No results found.  Recent Labs: Lab Results  Component Value Date   WBC 3.9 (L) 01/05/2020   HGB 12.5 01/05/2020   PLT 181 01/05/2020   NA 137 01/05/2020   K 3.6 01/05/2020   CL 106 01/05/2020   CO2 23 01/05/2020   GLUCOSE 91 01/05/2020   BUN 11 01/05/2020   CREATININE 0.90 01/05/2020   BILITOT 0.5 12/27/2019   ALKPHOS 58 12/27/2019   AST 17 12/27/2019   ALT 14 12/27/2019   PROT 7.5 12/27/2019   ALBUMIN 3.8 12/27/2019   CALCIUM 9.0 01/05/2020   GFRAA 72 11/07/2019   QFTBGOLDPLUS Negative 07/25/2019    Speciality Comments: PLQ EYe Exam: 01/10/2020 WNL @ [facility] [place] up in 6 months  Procedures:  No procedures performed Allergies: Levofloxacin, Nitrofurantoin, Penicillins, Sulfonamide derivatives, and Pantoprazole   Assessment / Plan:     Visit Diagnoses: Sjogren's syndrome with keratoconjunctivitis sicca (HCC) - ANA+, sicca symptoms: She continues to have sicca symptoms.  Over-the-counter products were discussed.  I will check autoimmune labs today.- Plan: Anti-DNA antibody, double-stranded, C3 and C4, Sedimentation rate  High risk medication use - Plaquenil 200 mg 1 tablet by mouth daily Monday to Friday. PLQ Eye Exam: 01/10/2020  - Plan: CBC with Differential/Platelet, COMPLETE METABOLIC PANEL WITH GFR today and then every 5 months.  Chronic pain of both knees -she has been experiencing increased pain and discomfort in her bilateral knee joints.  No warmth swelling effusion was noted.  Plan: XR KNEE 3 VIEW RIGHT, XR KNEE 3 VIEW LEFT, have given her a handout on knee joint exercises.  I will also refer her to physical therapy.  Ambulatory referral to Physical Therapy.  X-rays were consistent with bilateral moderate osteoarthritis and  chondromalacia patella.  PMR (polymyalgia  rheumatica) (HCC) - In remission.  Paresthesia of both hands - Resolved.  Trochanteric bursitis of both hips-IT band stretches were demonstrated and discussed.  Chronic midline low back pain with right-sided sciatica-she has chronic discomfort in her lower back.  Osteopenia of multiple sites - Followed by Dr. Quay Burow.  DEXA ordered by Dr. Quay Burow  Other medical problems are listed as follows:  Other fatigue  S/P right mastectomy  History of cancer of right breast  History of hypertension  History of hyperlipidemia  History of peripheral neuropathy  History of gastroesophageal reflux (GERD)  Orders: Orders Placed This Encounter  Procedures  . XR KNEE 3  VIEW RIGHT  . XR KNEE 3 VIEW LEFT  . CBC with Differential/Platelet  . COMPLETE METABOLIC PANEL WITH GFR  . Anti-DNA antibody, double-stranded  . C3 and C4  . Sedimentation rate  . Ambulatory referral to Physical Therapy   No orders of the defined types were placed in this encounter.  .  Follow-Up Instructions: Return in about 5 months (around 09/23/2020) for Sjogren's.   Bo Merino, MD  Note - This record has been created using Editor, commissioning.  Chart creation errors have been sought, but may not always  have been located. Such creation errors do not reflect on  the standard of medical care.

## 2020-04-17 DIAGNOSIS — C50911 Malignant neoplasm of unspecified site of right female breast: Secondary | ICD-10-CM | POA: Diagnosis not present

## 2020-04-18 DIAGNOSIS — E1142 Type 2 diabetes mellitus with diabetic polyneuropathy: Secondary | ICD-10-CM | POA: Diagnosis not present

## 2020-04-18 DIAGNOSIS — L84 Corns and callosities: Secondary | ICD-10-CM | POA: Diagnosis not present

## 2020-04-18 DIAGNOSIS — M79676 Pain in unspecified toe(s): Secondary | ICD-10-CM | POA: Diagnosis not present

## 2020-04-18 DIAGNOSIS — B351 Tinea unguium: Secondary | ICD-10-CM | POA: Diagnosis not present

## 2020-04-23 ENCOUNTER — Ambulatory Visit: Payer: Self-pay

## 2020-04-23 ENCOUNTER — Ambulatory Visit: Payer: PPO | Admitting: Rheumatology

## 2020-04-23 ENCOUNTER — Encounter: Payer: Self-pay | Admitting: Rheumatology

## 2020-04-23 ENCOUNTER — Other Ambulatory Visit: Payer: Self-pay

## 2020-04-23 VITALS — BP 180/83 | HR 60 | Resp 14 | Ht 69.0 in | Wt 195.4 lb

## 2020-04-23 DIAGNOSIS — R5383 Other fatigue: Secondary | ICD-10-CM | POA: Diagnosis not present

## 2020-04-23 DIAGNOSIS — G8929 Other chronic pain: Secondary | ICD-10-CM | POA: Diagnosis not present

## 2020-04-23 DIAGNOSIS — M3501 Sicca syndrome with keratoconjunctivitis: Secondary | ICD-10-CM

## 2020-04-23 DIAGNOSIS — M5441 Lumbago with sciatica, right side: Secondary | ICD-10-CM | POA: Diagnosis not present

## 2020-04-23 DIAGNOSIS — M25562 Pain in left knee: Secondary | ICD-10-CM | POA: Diagnosis not present

## 2020-04-23 DIAGNOSIS — Z79899 Other long term (current) drug therapy: Secondary | ICD-10-CM

## 2020-04-23 DIAGNOSIS — M25561 Pain in right knee: Secondary | ICD-10-CM

## 2020-04-23 DIAGNOSIS — M7062 Trochanteric bursitis, left hip: Secondary | ICD-10-CM

## 2020-04-23 DIAGNOSIS — Z8679 Personal history of other diseases of the circulatory system: Secondary | ICD-10-CM

## 2020-04-23 DIAGNOSIS — M7061 Trochanteric bursitis, right hip: Secondary | ICD-10-CM

## 2020-04-23 DIAGNOSIS — M3500 Sicca syndrome, unspecified: Secondary | ICD-10-CM

## 2020-04-23 DIAGNOSIS — M353 Polymyalgia rheumatica: Secondary | ICD-10-CM | POA: Diagnosis not present

## 2020-04-23 DIAGNOSIS — C50911 Malignant neoplasm of unspecified site of right female breast: Secondary | ICD-10-CM | POA: Diagnosis not present

## 2020-04-23 DIAGNOSIS — Z9011 Acquired absence of right breast and nipple: Secondary | ICD-10-CM

## 2020-04-23 DIAGNOSIS — M8589 Other specified disorders of bone density and structure, multiple sites: Secondary | ICD-10-CM | POA: Diagnosis not present

## 2020-04-23 DIAGNOSIS — Z853 Personal history of malignant neoplasm of breast: Secondary | ICD-10-CM | POA: Diagnosis not present

## 2020-04-23 DIAGNOSIS — Z8669 Personal history of other diseases of the nervous system and sense organs: Secondary | ICD-10-CM

## 2020-04-23 DIAGNOSIS — M359 Systemic involvement of connective tissue, unspecified: Secondary | ICD-10-CM

## 2020-04-23 DIAGNOSIS — Z8719 Personal history of other diseases of the digestive system: Secondary | ICD-10-CM

## 2020-04-23 DIAGNOSIS — Z8639 Personal history of other endocrine, nutritional and metabolic disease: Secondary | ICD-10-CM | POA: Diagnosis not present

## 2020-04-23 DIAGNOSIS — R202 Paresthesia of skin: Secondary | ICD-10-CM

## 2020-04-23 NOTE — Patient Instructions (Signed)
Journal for Nurse Practitioners, 15(4), 263-267. Retrieved October 25, 2017 from http://clinicalkey.com/nursing">  Knee Exercises Ask your health care provider which exercises are safe for you. Do exercises exactly as told by your health care provider and adjust them as directed. It is normal to feel mild stretching, pulling, tightness, or discomfort as you do these exercises. Stop right away if you feel sudden pain or your pain gets worse. Do not begin these exercises until told by your health care provider. Stretching and range-of-motion exercises These exercises warm up your muscles and joints and improve the movement and flexibility of your knee. These exercises also help to relieve pain and swelling. Knee extension, prone 1. Lie on your abdomen (prone position) on a bed. 2. Place your left / right knee just beyond the edge of the surface so your knee is not on the bed. You can put a towel under your left / right thigh just above your kneecap for comfort. 3. Relax your leg muscles and allow gravity to straighten your knee (extension). You should feel a stretch behind your left / right knee. 4. Hold this position for __________ seconds. 5. Scoot up so your knee is supported between repetitions. Repeat __________ times. Complete this exercise __________ times a day. Knee flexion, active 1. Lie on your back with both legs straight. If this causes back discomfort, bend your left / right knee so your foot is flat on the floor. 2. Slowly slide your left / right heel back toward your buttocks. Stop when you feel a gentle stretch in the front of your knee or thigh (flexion). 3. Hold this position for __________ seconds. 4. Slowly slide your left / right heel back to the starting position. Repeat __________ times. Complete this exercise __________ times a day.   Quadriceps stretch, prone 1. Lie on your abdomen on a firm surface, such as a bed or padded floor. 2. Bend your left / right knee and hold  your ankle. If you cannot reach your ankle or pant leg, loop a belt around your foot and grab the belt instead. 3. Gently pull your heel toward your buttocks. Your knee should not slide out to the side. You should feel a stretch in the front of your thigh and knee (quadriceps). 4. Hold this position for __________ seconds. Repeat __________ times. Complete this exercise __________ times a day.   Hamstring, supine 1. Lie on your back (supine position). 2. Loop a belt or towel over the ball of your left / right foot. The ball of your foot is on the walking surface, right under your toes. 3. Straighten your left / right knee and slowly pull on the belt to raise your leg until you feel a gentle stretch behind your knee (hamstring). ? Do not let your knee bend while you do this. ? Keep your other leg flat on the floor. 4. Hold this position for __________ seconds. Repeat __________ times. Complete this exercise __________ times a day. Strengthening exercises These exercises build strength and endurance in your knee. Endurance is the ability to use your muscles for a long time, even after they get tired. Quadriceps, isometric This exercise stretches the muscles in front of your thigh (quadriceps) without moving your knee joint (isometric). 1. Lie on your back with your left / right leg extended and your other knee bent. Put a rolled towel or small pillow under your knee if told by your health care provider. 2. Slowly tense the muscles in the front of your   left / right thigh. You should see your kneecap slide up toward your hip or see increased dimpling just above the knee. This motion will push the back of the knee toward the floor. 3. For __________ seconds, hold the muscle as tight as you can without increasing your pain. 4. Relax the muscles slowly and completely. Repeat __________ times. Complete this exercise __________ times a day.   Straight leg raises This exercise stretches the muscles in  front of your thigh (quadriceps) and the muscles that move your hips (hip flexors). 1. Lie on your back with your left / right leg extended and your other knee bent. 2. Tense the muscles in the front of your left / right thigh. You should see your kneecap slide up or see increased dimpling just above the knee. Your thigh may even shake a bit. 3. Keep these muscles tight as you raise your leg 4-6 inches (10-15 cm) off the floor. Do not let your knee bend. 4. Hold this position for __________ seconds. 5. Keep these muscles tense as you lower your leg. 6. Relax your muscles slowly and completely after each repetition. Repeat __________ times. Complete this exercise __________ times a day. Hamstring, isometric 1. Lie on your back on a firm surface. 2. Bend your left / right knee about __________ degrees. 3. Dig your left / right heel into the surface as if you are trying to pull it toward your buttocks. Tighten the muscles in the back of your thighs (hamstring) to "dig" as hard as you can without increasing any pain. 4. Hold this position for __________ seconds. 5. Release the tension gradually and allow your muscles to relax completely for __________ seconds after each repetition. Repeat __________ times. Complete this exercise __________ times a day. Hamstring curls If told by your health care provider, do this exercise while wearing ankle weights. Begin with __________ lb weights. Then increase the weight by 1 lb (0.5 kg) increments. Do not wear ankle weights that are more than __________ lb. 1. Lie on your abdomen with your legs straight. 2. Bend your left / right knee as far as you can without feeling pain. Keep your hips flat against the floor. 3. Hold this position for __________ seconds. 4. Slowly lower your leg to the starting position. Repeat __________ times. Complete this exercise __________ times a day.   Squats This exercise strengthens the muscles in front of your thigh and knee  (quadriceps). 1. Stand in front of a table, with your feet and knees pointing straight ahead. You may rest your hands on the table for balance but not for support. 2. Slowly bend your knees and lower your hips like you are going to sit in a chair. ? Keep your weight over your heels, not over your toes. ? Keep your lower legs upright so they are parallel with the table legs. ? Do not let your hips go lower than your knees. ? Do not bend lower than told by your health care provider. ? If your knee pain increases, do not bend as low. 3. Hold the squat position for __________ seconds. 4. Slowly push with your legs to return to standing. Do not use your hands to pull yourself to standing. Repeat __________ times. Complete this exercise __________ times a day. Wall slides This exercise strengthens the muscles in front of your thigh and knee (quadriceps). 1. Lean your back against a smooth wall or door, and walk your feet out 18-24 inches (46-61 cm) from it. 2.   Place your feet hip-width apart. 3. Slowly slide down the wall or door until your knees bend __________ degrees. Keep your knees over your heels, not over your toes. Keep your knees in line with your hips. 4. Hold this position for __________ seconds. Repeat __________ times. Complete this exercise __________ times a day.   Straight leg raises This exercise strengthens the muscles that rotate the leg at the hip and move it away from your body (hip abductors). 1. Lie on your side with your left / right leg in the top position. Lie so your head, shoulder, knee, and hip line up. You may bend your bottom knee to help you keep your balance. 2. Roll your hips slightly forward so your hips are stacked directly over each other and your left / right knee is facing forward. 3. Leading with your heel, lift your top leg 4-6 inches (10-15 cm). You should feel the muscles in your outer hip lifting. ? Do not let your foot drift forward. ? Do not let your  knee roll toward the ceiling. 4. Hold this position for __________ seconds. 5. Slowly return your leg to the starting position. 6. Let your muscles relax completely after each repetition. Repeat __________ times. Complete this exercise __________ times a day.   Straight leg raises This exercise stretches the muscles that move your hips away from the front of the pelvis (hip extensors). 1. Lie on your abdomen on a firm surface. You can put a pillow under your hips if that is more comfortable. 2. Tense the muscles in your buttocks and lift your left / right leg about 4-6 inches (10-15 cm). Keep your knee straight as you lift your leg. 3. Hold this position for __________ seconds. 4. Slowly lower your leg to the starting position. 5. Let your leg relax completely after each repetition. Repeat __________ times. Complete this exercise __________ times a day. This information is not intended to replace advice given to you by your health care provider. Make sure you discuss any questions you have with your health care provider. Document Revised: 10/26/2017 Document Reviewed: 10/26/2017 Elsevier Patient Education  2021 [employer]  

## 2020-04-24 LAB — COMPLETE METABOLIC PANEL WITH GFR
AG Ratio: 1.3 (calc) (ref 1.0–2.5)
ALT: 16 U/L (ref 6–29)
AST: 14 U/L (ref 10–35)
Albumin: 4 g/dL (ref 3.6–5.1)
Alkaline phosphatase (APISO): 71 U/L (ref 37–153)
BUN: 11 mg/dL (ref 7–25)
CO2: 29 mmol/L (ref 20–32)
Calcium: 9.4 mg/dL (ref 8.6–10.4)
Chloride: 104 mmol/L (ref 98–110)
Creat: 0.92 mg/dL (ref 0.60–0.93)
GFR, Est African American: 72 mL/min/{1.73_m2} (ref >=60)
GFR, Est Non African American: 62 mL/min/{1.73_m2} (ref >=60)
Globulin: 3.2 g/dL (calc) (ref 1.9–3.7)
Glucose, Bld: 125 mg/dL — ABNORMAL HIGH (ref 65–99)
Potassium: 4.2 mmol/L (ref 3.5–5.3)
Sodium: 139 mmol/L (ref 135–146)
Total Bilirubin: 0.5 mg/dL (ref 0.2–1.2)
Total Protein: 7.2 g/dL (ref 6.1–8.1)

## 2020-04-24 LAB — CBC WITH DIFFERENTIAL/PLATELET
Absolute Monocytes: 221 cells/uL (ref 200–950)
Basophils Absolute: 11 cells/uL (ref 0–200)
Basophils Relative: 0.3 %
Eosinophils Absolute: 32 cells/uL (ref 15–500)
Eosinophils Relative: 0.9 %
HCT: 38.8 % (ref 35.0–45.0)
Hemoglobin: 12.5 g/dL (ref 11.7–15.5)
Lymphs Abs: 998 cells/uL (ref 850–3900)
MCH: 29.6 pg (ref 27.0–33.0)
MCHC: 32.2 g/dL (ref 32.0–36.0)
MCV: 91.7 fL (ref 80.0–100.0)
MPV: 11.5 fL (ref 7.5–12.5)
Monocytes Relative: 6.3 %
Neutro Abs: 2240 cells/uL (ref 1500–7800)
Neutrophils Relative %: 64 %
Platelets: 224 10*3/uL (ref 140–400)
RBC: 4.23 10*6/uL (ref 3.80–5.10)
RDW: 13 % (ref 11.0–15.0)
Total Lymphocyte: 28.5 %
WBC: 3.5 10*3/uL — ABNORMAL LOW (ref 3.8–10.8)

## 2020-04-24 LAB — SEDIMENTATION RATE: Sed Rate: 36 mm/h — ABNORMAL HIGH (ref 0–30)

## 2020-04-24 LAB — C3 AND C4
C3 Complement: 129 mg/dL (ref 83–193)
C4 Complement: 39 mg/dL (ref 15–57)

## 2020-04-24 LAB — ANTI-DNA ANTIBODY, DOUBLE-STRANDED: ds DNA Ab: 1 IU/mL

## 2020-04-24 NOTE — Progress Notes (Signed)
White cell count is low and stable, glucose is elevated probably not a fasting sample.  Sed rate is mildly elevated and stable.  Complements are normal.  Double-stranded DNA is negative.  Labs do not indicate active disease.  Please advise to continue current treatment.

## 2020-04-26 DIAGNOSIS — M25562 Pain in left knee: Secondary | ICD-10-CM | POA: Diagnosis not present

## 2020-04-26 DIAGNOSIS — G8929 Other chronic pain: Secondary | ICD-10-CM | POA: Diagnosis not present

## 2020-04-26 DIAGNOSIS — M25561 Pain in right knee: Secondary | ICD-10-CM | POA: Diagnosis not present

## 2020-04-30 DIAGNOSIS — G8929 Other chronic pain: Secondary | ICD-10-CM | POA: Diagnosis not present

## 2020-04-30 DIAGNOSIS — M25561 Pain in right knee: Secondary | ICD-10-CM | POA: Diagnosis not present

## 2020-04-30 DIAGNOSIS — M25562 Pain in left knee: Secondary | ICD-10-CM | POA: Diagnosis not present

## 2020-05-02 DIAGNOSIS — M25562 Pain in left knee: Secondary | ICD-10-CM | POA: Diagnosis not present

## 2020-05-02 DIAGNOSIS — G8929 Other chronic pain: Secondary | ICD-10-CM | POA: Diagnosis not present

## 2020-05-02 DIAGNOSIS — M25561 Pain in right knee: Secondary | ICD-10-CM | POA: Diagnosis not present

## 2020-05-08 DIAGNOSIS — M25562 Pain in left knee: Secondary | ICD-10-CM | POA: Diagnosis not present

## 2020-05-08 DIAGNOSIS — M25561 Pain in right knee: Secondary | ICD-10-CM | POA: Diagnosis not present

## 2020-05-08 DIAGNOSIS — G8929 Other chronic pain: Secondary | ICD-10-CM | POA: Diagnosis not present

## 2020-05-14 DIAGNOSIS — G8929 Other chronic pain: Secondary | ICD-10-CM | POA: Diagnosis not present

## 2020-05-14 DIAGNOSIS — M25561 Pain in right knee: Secondary | ICD-10-CM | POA: Diagnosis not present

## 2020-05-14 DIAGNOSIS — M25562 Pain in left knee: Secondary | ICD-10-CM | POA: Diagnosis not present

## 2020-05-16 DIAGNOSIS — G8929 Other chronic pain: Secondary | ICD-10-CM | POA: Diagnosis not present

## 2020-05-16 DIAGNOSIS — M25561 Pain in right knee: Secondary | ICD-10-CM | POA: Diagnosis not present

## 2020-05-16 DIAGNOSIS — M25562 Pain in left knee: Secondary | ICD-10-CM | POA: Diagnosis not present

## 2020-05-17 ENCOUNTER — Other Ambulatory Visit: Payer: Self-pay | Admitting: Internal Medicine

## 2020-05-19 ENCOUNTER — Other Ambulatory Visit: Payer: Self-pay | Admitting: Cardiology

## 2020-05-20 NOTE — Telephone Encounter (Signed)
Prescription refill request for Eliquis received. Indication: PAF Last office visit: 02/16/20 Scr: 0.92 on 04/23/20 Age: 75 Weight: 88.5kg  Based on above findings Eliquis 5mg  twice daily is the appropriate dose.  Refill approved.

## 2020-05-21 DIAGNOSIS — M25561 Pain in right knee: Secondary | ICD-10-CM | POA: Diagnosis not present

## 2020-05-21 DIAGNOSIS — M25562 Pain in left knee: Secondary | ICD-10-CM | POA: Diagnosis not present

## 2020-05-21 DIAGNOSIS — G8929 Other chronic pain: Secondary | ICD-10-CM | POA: Diagnosis not present

## 2020-05-23 DIAGNOSIS — M25562 Pain in left knee: Secondary | ICD-10-CM | POA: Diagnosis not present

## 2020-05-23 DIAGNOSIS — G8929 Other chronic pain: Secondary | ICD-10-CM | POA: Diagnosis not present

## 2020-05-23 DIAGNOSIS — M25561 Pain in right knee: Secondary | ICD-10-CM | POA: Diagnosis not present

## 2020-05-28 DIAGNOSIS — M25562 Pain in left knee: Secondary | ICD-10-CM | POA: Diagnosis not present

## 2020-05-28 DIAGNOSIS — M25561 Pain in right knee: Secondary | ICD-10-CM | POA: Diagnosis not present

## 2020-05-28 DIAGNOSIS — G8929 Other chronic pain: Secondary | ICD-10-CM | POA: Diagnosis not present

## 2020-05-30 DIAGNOSIS — M25561 Pain in right knee: Secondary | ICD-10-CM | POA: Diagnosis not present

## 2020-05-30 DIAGNOSIS — G8929 Other chronic pain: Secondary | ICD-10-CM | POA: Diagnosis not present

## 2020-05-30 DIAGNOSIS — M25562 Pain in left knee: Secondary | ICD-10-CM | POA: Diagnosis not present

## 2020-05-31 ENCOUNTER — Telehealth: Payer: Self-pay | Admitting: Cardiology

## 2020-05-31 NOTE — Telephone Encounter (Signed)
Pt agreed to be seen in office if anything were to come available. Pt also verbalized going to the ED for evaluation if her symptoms became worse.

## 2020-05-31 NOTE — Telephone Encounter (Signed)
    It is difficult to say what her symptoms are due to as it does not seem consistent with fluid build-up. I think she would most benefit from an office visit to assess and will likely need a repeat EKG and follow-up labs at that time (Hgb and platelets were stable last month). Would see if she can be scheduled with an APP or Dr. Harl Bowie in the next 1-2 weeks and added to the wait-list if a cancellation if nothing currently available. If symptoms progress, she should go to the Emergency Department for immediate evaluation.   Signed, Erma Heritage, PA-C 05/31/2020, 3:35 PM Pager: [phone]

## 2020-05-31 NOTE — Telephone Encounter (Signed)
    Has she been able to check her heart rate at home as she does have a history of atrial fibrillation?  Any difficulty with breathing at night, lower extremity edema or weight change?  Her prior cardiac catheterization 11/2019 showed no evidence of CAD but the RCA was unable to be engaged. It was previously felt her prior symptoms were not secondary to a blockage given this and prior stress test had been normal.  Signed, Erma Heritage, PA-C 05/31/2020, 12:44 PM Pager: [phone]

## 2020-05-31 NOTE — Telephone Encounter (Signed)
Pt has hx of PAF. Pt states that she has no way to check HR, but does not feel palpitations. Pt also states that she does not have any trouble breathing at night and that her knees are swollen, but does not know if she has gained weight as she does not have a scale at home.

## 2020-05-31 NOTE — Telephone Encounter (Signed)
Pt is c/o SOB, dizziness, and a chest soreness. Pt stated that her SOB started around a week and a half ago. She feels more out of breath when she is moving around with activity. Pt confirms that she is still taking Cardizem CD 120 mg QD, Lopressor 25 mg BID, and Atorvastatin 10 mg QD. Please advise.

## 2020-05-31 NOTE — Telephone Encounter (Signed)
Pt c/o Shortness Of Breath: STAT if SOB developed within the last 24 hours or pt is noticeably SOB on the phone  1. Are you currently SOB (can you hear that pt is SOB on the phone)? no  2. How long have you been experiencing SOB? Started Sunday  3. Are you SOB when sitting or when up moving around? Up moving around   4. Are you currently experiencing any other symptoms? She is having dizziness and some coughing not a lot

## 2020-06-04 ENCOUNTER — Telehealth: Payer: Self-pay | Admitting: Pharmacist

## 2020-06-04 NOTE — Progress Notes (Signed)
Chronic Care Management Pharmacy Assistant   Name: Angela Simmons  MRN: [id number] DOB: 1945/03/01    Reason for Encounter: Disease State Hypertension Call   Conditions to be addressed/monitored: HTN   Recent office visits:  None ID  Recent consult visits:  04/23/20 Dr. Bo Merino Rheumatology d/c prednisone  Hospital visits:  Medication Reconciliation was completed by comparing discharge summary, patient's EMR and Pharmacy list, and upon discussion with patient.  Admitted to the hospital on 12/07/19 due to left heart cath. Discharge date was 12/07/19. Discharged from [facility]?Medications Started at [facility] Discharge:?? -started none noted  Medication Changes at Hospital Discharge: -Changed none noted  Medications Discontinued at Hospital Discharge: -Stopped none noted  Medications that remain the same after Hospital Discharge:??  -All other medications will remain the same.    Medications: Outpatient Encounter Medications as of 06/04/2020  Medication Sig  . atorvastatin (LIPITOR) 10 MG tablet TAKE 1 TABLET BY MOUTH DAILY  . benazepril (LOTENSIN) 40 MG tablet TAKE 1 TABLET BY MOUTH EVERY DAY  . Blood Glucose Monitoring Suppl (ONETOUCH VERIO) w/Device KIT USE TO TEST BLOOD SUGAR ONCE DAILY AS DIRECTED  . diclofenac sodium (VOLTAREN) 1 % GEL Apply 3 g to 3 large joints up to 3 times daily. (Patient taking differently: Apply 3 g topically 3 (three) times daily as needed (pain). 3 large joints)  . diltiazem (CARDIZEM CD) 120 MG 24 hr capsule Take 1 capsule (120 mg total) by mouth daily.  Marland Kitchen ELIQUIS 5 MG TABS tablet TAKE 1 TABLET BY MOUTH TWICE DAILY  . fluticasone (FLONASE) 50 MCG/ACT nasal spray USE 2 SPRAYS IN EACH NOSTRIL EVERY DAY (Patient taking differently: Place 2 sprays into both nostrils daily as needed for allergies.)  . Ginger, Zingiber officinalis, 550 MG CAPS Take 1 capsule (550 mg total) by mouth daily. (Patient taking differently:  Take 550 mg by mouth daily.)  . glucose blood test strip USE TO TEST BLOOD SUGAR ONCE DAILY. E11.40  . hydroxychloroquine (PLAQUENIL) 200 MG tablet TAKE 1 TABLET BY MOUTH ONCE DAILY ON MONDAY-FRIDAY  . loratadine (CLARITIN) 10 MG tablet TAKE 1 TABLET BY MOUTH EVERY DAY (Patient taking differently: Take 10 mg by mouth daily.)  . metoprolol tartrate (LOPRESSOR) 25 MG tablet Take 1 tablet (25 mg total) by mouth 2 (two) times daily.  . Multiple Vitamins-Minerals (CENTRUM SILVER 50+WOMEN) TABS Take 1 tablet by mouth daily at 6 (six) AM.  . nitroGLYCERIN (NITROSTAT) 0.4 MG SL tablet Place 1 tablet (0.4 mg total) under the tongue every 5 (five) minutes as needed for chest pain.  . Omega-3 Fatty Acids (FISH OIL) 1000 MG CAPS Take 1,000 mg by mouth daily.  Marland Kitchen omeprazole (PRILOSEC) 40 MG capsule TAKE ONE CAPSULE BY MOUTH DAILY (Patient taking differently: Take 40 mg by mouth daily.)  . ONETOUCH DELICA LANCETS 07P MISC USE TO TEST ONCE DAILY  . ONETOUCH VERIO test strip USE TO TEST BLOOD SUGAR ONCE DAILY  . triamcinolone ointment (KENALOG) 0.5 % Apply 1 application topically 2 (two) times daily. Do not use for more than 14 days in a row (Patient taking differently: Apply 1 application topically daily as needed (Rash).)  . Turmeric (QC TUMERIC COMPLEX) 500 MG CAPS Take 1 capsule by mouth daily. (Patient taking differently: Take 500 mg by mouth daily.)   No facility-administered encounter medications on file as of 06/04/2020.    Reviewed chart prior to disease state call. Spoke with patient regarding BP  Recent Office  Vitals: BP Readings from Last 3 Encounters:  04/23/20 (!) 180/83  02/16/20 124/78  01/05/20 (!) 144/80   Pulse Readings from Last 3 Encounters:  04/23/20 60  02/16/20 80  01/05/20 (!) 56    Wt Readings from Last 3 Encounters:  04/23/20 195 lb 6.4 oz (88.6 kg)  02/16/20 195 lb (88.5 kg)  01/05/20 190 lb (86.2 kg)     Kidney Function Lab Results  Component Value Date/Time    CREATININE 0.92 04/23/2020 11:44 AM   CREATININE 0.90 01/05/2020 03:23 PM   CREATININE 0.95 12/27/2019 12:43 PM   CREATININE 0.92 11/07/2019 10:30 AM   GFR 59.41 (L) 12/27/2019 12:43 PM   GFRNONAA 62 04/23/2020 11:44 AM   GFRAA 72 04/23/2020 11:44 AM    BMP Latest Ref Rng & Units 04/23/2020 01/05/2020 12/27/2019  Glucose 65 - 99 mg/dL 125(H) 91 86  BUN 7 - 25 mg/dL 11 11 10  Creatinine 0.60 - 0.93 mg/dL 0.92 0.90 0.95  BUN/Creat Ratio 6 - 22 (calc) NOT APPLICABLE - -  Sodium 135 - 146 mmol/L 139 137 140  Potassium 3.5 - 5.3 mmol/L 4.2 3.6 4.1  Chloride 98 - 110 mmol/L 104 106 105  CO2 20 - 32 mmol/L 29 23 27  Calcium 8.6 - 10.4 mg/dL 9.4 9.0 9.3    . Current antihypertensive regimen:  Benazepril 40 mg daily, metoprolol 25 mg 1 tab BID  . How often are you checking your Blood Pressure? Patient states that she does not check blood pressure every day, but stays that she believes her blood pressure is usually high. She doe not have any symptoms of dizziness or light headedness   . Current home BP readings: Patient last blood pressure reading was 180/83 on 04/23/20  . What recent interventions/DTPs have been made by any provider to improve Blood Pressure control since last CPP Visit: None ID  . Any recent hospitalizations or ED visits since last visit with CPP? Yes, patient was seen at [facility] on 12/07/19 for left heart cath  . What diet changes have been made to improve Blood Pressure Control?  Patient states that she has not made any changes to her diet  . What exercise is being done to improve your Blood Pressure Control?  Patient states that she is very active around the house. She states that she would like to lose weight but is having an hard time  Adherence Review: Is the patient currently on ACE/ARB medication? Yes Does the patient have >5 day gap between last estimated fill dates? No  Star Rating Drugs: Atorvastatin 05/26/20 30 d Benazepril 04/27/20 90 d  Tracy  Ellis,CMA Clinical Pharmacist Assistant [phone]  Time spent:34 

## 2020-06-05 ENCOUNTER — Other Ambulatory Visit: Payer: Self-pay

## 2020-06-05 ENCOUNTER — Ambulatory Visit (INDEPENDENT_AMBULATORY_CARE_PROVIDER_SITE_OTHER): Payer: PPO

## 2020-06-05 ENCOUNTER — Encounter: Payer: Self-pay | Admitting: Physician Assistant

## 2020-06-05 ENCOUNTER — Ambulatory Visit: Payer: PPO | Admitting: Physician Assistant

## 2020-06-05 ENCOUNTER — Other Ambulatory Visit: Payer: Self-pay | Admitting: Physician Assistant

## 2020-06-05 VITALS — BP 160/90 | HR 68 | Ht 69.0 in | Wt 193.0 lb

## 2020-06-05 DIAGNOSIS — E785 Hyperlipidemia, unspecified: Secondary | ICD-10-CM | POA: Diagnosis not present

## 2020-06-05 DIAGNOSIS — I4891 Unspecified atrial fibrillation: Secondary | ICD-10-CM

## 2020-06-05 DIAGNOSIS — R0600 Dyspnea, unspecified: Secondary | ICD-10-CM | POA: Diagnosis not present

## 2020-06-05 DIAGNOSIS — I480 Paroxysmal atrial fibrillation: Secondary | ICD-10-CM

## 2020-06-05 DIAGNOSIS — I471 Supraventricular tachycardia: Secondary | ICD-10-CM | POA: Diagnosis not present

## 2020-06-05 DIAGNOSIS — R079 Chest pain, unspecified: Secondary | ICD-10-CM

## 2020-06-05 DIAGNOSIS — I10 Essential (primary) hypertension: Secondary | ICD-10-CM | POA: Diagnosis not present

## 2020-06-05 DIAGNOSIS — R0609 Other forms of dyspnea: Secondary | ICD-10-CM

## 2020-06-05 NOTE — Patient Instructions (Signed)
Medication Instructions:  Your physician recommends that you continue on your current medications as directed. Please refer to the Current Medication list given to you today.  *If you need a refill on your cardiac medications before your next appointment, please call your pharmacy*   Lab Work: None If you have labs (blood work) drawn today and your tests are completely normal, you will receive your results only by: Marland Kitchen MyChart Message (if you have MyChart) OR . A paper copy in the mail If you have any lab test that is abnormal or we need to change your treatment, we will call you to review the results.   Follow-Up: At [facility], you and your health needs are our priority.  As part of our continuing mission to provide you with exceptional heart care, we have created designated Provider Care Teams.  These Care Teams include your primary Cardiologist (physician) and Advanced Practice Providers (APPs -  Physician Assistants and Nurse Practitioners) who all work together to provide you with the care you need, when you need it.  We recommend signing up for the patient portal called "MyChart".  Sign up information is provided on this After Visit Summary.  MyChart is used to connect with patients for Virtual Visits (Telemedicine).  Patients are able to view lab/test results, encounter notes, upcoming appointments, etc.  Non-urgent messages can be sent to your provider as well.   To learn more about what you can do with MyChart, go to [email or web address].    Your next appointment:   1 month(s)  The format for your next appointment:   In Person  Provider:   You will see one of the following Advanced Practice Providers on your designated Care Team:    Bernerd Pho, PA-C   Ermalinda Barrios, PA-C   Other Instructions [place] Monitor Instructions   Your physician has requested you wear a ZIO patch monitor for _14__ days.  This is a single patch monitor.   [employer] supplies one  patch monitor per enrollment. Additional stickers are not available. Please do not apply patch if you will be having a Nuclear Stress Test, Echocardiogram, Cardiac CT, MRI, or Chest Xray during the period you would be wearing the monitor. The patch cannot be worn during these tests. You cannot remove and re-apply the ZIO XT patch monitor.   Billing and Patient Assistance Program Information   We have supplied [employer] with any of your insurance information on file for billing purposes. [employer] offers a sliding scale Patient Assistance Program for patients that do not have insurance, or whose insurance does not completely cover the cost of the ZIO monitor.   You must apply for the Patient Assistance Program to qualify for this discounted rate.     To apply, please call [employer] at [phone], select option 4, then select option 2, and ask to apply for Patient Assistance Program.  Theodore Demark will ask your household income, and how many people are in your household.  They will quote your out-of-pocket cost based on that information.  [employer] will also be able to set up a 65-month, interest-free payment plan if needed.  When you are ready to remove the patch, follow instructions on the last 2 pages of the Patient Logbook. Stick patch monitor onto the last page of Patient Logbook.  Place Patient Logbook in the blue and white box.  Use locking tab on box and tape box closed securely.  The blue and white box has prepaid postage on it. Please  place it in the mailbox as soon as possible. Your physician should have your test results approximately 7 days after the monitor has been mailed back to [facility].  Call Rachel at [phone] if you have questions regarding your ZIO XT patch monitor. Call them immediately if you see an orange light blinking on your monitor.  If your monitor falls off in less than 4 days, contact our Monitor department at [phone]. ?If your monitor becomes loose  or falls off after 4 days call [employer] at [phone] for suggestions on securing your monitor.?  Two Gram Sodium Diet 2000 mg  What is Sodium? Sodium is a mineral found naturally in many foods. The most significant source of sodium in the diet is table salt, which is about 40% sodium.  Processed, convenience, and preserved foods also contain a large amount of sodium.  The body needs only 500 mg of sodium daily to function,  A normal diet provides more than enough sodium even if you do not use salt.  Why Limit Sodium? A build up of sodium in the body can cause thirst, increased blood pressure, shortness of breath, and water retention.  Decreasing sodium in the diet can reduce edema and risk of heart attack or stroke associated with high blood pressure.  Keep in mind that there are many other factors involved in these health problems.  Heredity, obesity, lack of exercise, cigarette smoking, stress and what you eat all play a role.  General Guidelines:  Do not add salt at the table or in cooking.  One teaspoon of salt contains over 2 grams of sodium.  Read food labels  Avoid processed and convenience foods  Ask your dietitian before eating any foods not dicussed in the menu planning guidelines  Consult your physician if you wish to use a salt substitute or a sodium containing medication such as antacids.  Limit milk and milk products to 16 oz (2 cups) per day.  Shopping Hints:  READ LABELS!! "Dietetic" does not necessarily mean low sodium.  Salt and other sodium ingredients are often added to foods during processing.   Menu Planning Guidelines Food Group Choose More Often Avoid  Beverages (see also the milk group All fruit juices, low-sodium, salt-free vegetables juices, low-sodium carbonated beverages Regular vegetable or tomato juices, commercially softened water used for drinking or cooking  Breads and Cereals Enriched white, wheat, rye and pumpernickel bread, hard rolls and dinner  rolls; muffins, cornbread and waffles; most dry cereals, cooked cereal without added salt; unsalted crackers and breadsticks; low sodium or homemade bread crumbs Bread, rolls and crackers with salted tops; quick breads; instant hot cereals; pancakes; commercial bread stuffing; self-rising flower and biscuit mixes; regular bread crumbs or cracker crumbs  Desserts and Sweets Desserts and sweets mad with mild should be within allowance Instant pudding mixes and cake mixes  Fats Butter or margarine; vegetable oils; unsalted salad dressings, regular salad dressings limited to 1 Tbs; light, sour and heavy cream Regular salad dressings containing bacon fat, bacon bits, and salt pork; snack dips made with instant soup mixes or processed cheese; salted nuts  Fruits Most fresh, frozen and canned fruits Fruits processed with salt or sodium-containing ingredient (some dried fruits are processed with sodium sulfites        Vegetables Fresh, frozen vegetables and low- sodium canned vegetables Regular canned vegetables, sauerkraut, pickled vegetables, and others prepared in brine; frozen vegetables in sauces; vegetables seasoned with ham, bacon or salt pork  Condiments, Sauces, Miscellaneous  Salt substitute with physician's approval; pepper, herbs, spices; vinegar, lemon or lime juice; hot pepper sauce; garlic powder, onion powder, low sodium soy sauce (1 Tbs.); low sodium condiments (ketchup, chili sauce, mustard) in limited amounts (1 tsp.) fresh ground horseradish; unsalted tortilla chips, pretzels, potato chips, popcorn, salsa (1/4 cup) Any seasoning made with salt including garlic salt, celery salt, onion salt, and seasoned salt; sea salt, rock salt, kosher salt; meat tenderizers; monosodium glutamate; mustard, regular soy sauce, barbecue, sauce, chili sauce, teriyaki sauce, steak sauce, Worcestershire sauce, and most flavored vinegars; canned gravy and mixes; regular condiments; salted snack foods, olives,  picles, relish, horseradish sauce, catsup   Food preparation: Try these seasonings Meats:    Pork Sage, onion Serve with applesauce  Chicken Poultry seasoning, thyme, parsley Serve with cranberry sauce  Lamb Curry powder, rosemary, garlic, thyme Serve with mint sauce or jelly  Veal Marjoram, basil Serve with current jelly, cranberry sauce  Beef Pepper, bay leaf Serve with dry mustard, unsalted chive butter  Fish [place] leaf, dill Serve with unsalted lemon butter, unsalted parsley butter  Vegetables:    Asparagus Lemon juice   Broccoli Lemon juice   Carrots Mustard dressing parsley, mint, nutmeg, glazed with unsalted butter and sugar   Green beans Marjoram, lemon juice, nutmeg,dill seed   Tomatoes Basil, marjoram, onion   Spice /blend for [employer]" 4 tsp ground thyme 1 tsp ground sage 3 tsp ground rosemary 4 tsp ground marjoram   Test your knowledge 1. A product that says "Salt Free" may still contain sodium. True or False 2. Garlic Powder and Hot Pepper Sauce an be used as alternative seasonings.True or False 3. Processed foods have more sodium than fresh foods.  True or False 4. Canned Vegetables have less sodium than froze True or False  WAYS TO DECREASE YOUR SODIUM INTAKE 1. Avoid the use of added salt in cooking and at the table.  Table salt (and other prepared seasonings which contain salt) is probably one of the greatest sources of sodium in the diet.  Unsalted foods can gain flavor from the sweet, sour, and butter taste sensations of herbs and spices.  Instead of using salt for seasoning, try the following seasonings with the foods listed.  Remember: how you use them to enhance natural food flavors is limited only by your creativity... Allspice-Meat, fish, eggs, fruit, peas, red and yellow vegetables Almond Extract-Fruit baked goods Anise Seed-Sweet breads, fruit, carrots, beets, cottage cheese, cookies (tastes like licorice) Basil-Meat, fish, eggs, vegetables, rice, vegetables  salads, soups, sauces Bay Leaf-Meat, fish, stews, poultry Burnet-Salad, vegetables (cucumber-like flavor) Caraway Seed-Bread, cookies, cottage cheese, meat, vegetables, cheese, rice Cardamon-Baked goods, fruit, soups Celery Powder or seed-Salads, salad dressings, sauces, meatloaf, soup, bread.Do not use  celery salt Chervil-Meats, salads, fish, eggs, vegetables, cottage cheese (parsley-like flavor) Chili Power-Meatloaf, chicken cheese, corn, eggplant, egg dishes Chives-Salads cottage cheese, egg dishes, soups, vegetables, sauces Cilantro-Salsa, casseroles Cinnamon-Baked goods, fruit, pork, lamb, chicken, carrots Cloves-Fruit, baked goods, fish, pot roast, green beans, beets, carrots Coriander-Pastry, cookies, meat, salads, cheese (lemon-orange flavor) Cumin-Meatloaf, fish,cheese, eggs, cabbage,fruit pie (caraway flavor) [employer], fruit, eggs, fish, poultry, cottage cheese, vegetables Dill Seed-Meat, cottage cheese, poultry, vegetables, fish, salads, bread Fennel Seed-Bread, cookies, apples, pork, eggs, fish, beets, cabbage, cheese, Licorice-like flavor Garlic-(buds or powder) Salads, meat, poultry, fish, bread, butter, vegetables, potatoes.Do not  use garlic salt Ginger-Fruit, vegetables, baked goods, meat, fish, poultry Horseradish Root-Meet, vegetables, butter Lemon Juice or Extract-Vegetables, fruit, tea, baked goods, fish salads Mace-Baked goods fruit,  vegetables, fish, poultry (taste like nutmeg) Maple Extract-Syrups Marjoram-Meat, chicken, fish, vegetables, breads, green salads (taste like Sage) Mint-Tea, lamb, sherbet, vegetables, desserts, carrots, cabbage Mustard, Dry or Seed-Cheese, eggs, meats, vegetables, poultry Nutmeg-Baked goods, fruit, chicken, eggs, vegetables, desserts Onion Powder-Meat, fish, poultry, vegetables, cheese, eggs, bread, rice salads (Do not use   Onion salt) Orange Extract-Desserts, baked goods Oregano-Pasta, eggs, cheese, onions, pork, lamb,  fish, chicken, vegetables, green salads Paprika-Meat, fish, poultry, eggs, cheese, vegetables Parsley Flakes-Butter, vegetables, meat fish, poultry, eggs, bread, salads (certain forms may   Contain sodium Pepper-Meat fish, poultry, vegetables, eggs Peppermint Extract-Desserts, baked goods Poppy Seed-Eggs, bread, cheese, fruit dressings, baked goods, noodles, vegetables, cottage  [employer], poultry, meat, fish, cauliflower, turnips,eggs bread Saffron-Rice, bread, veal, chicken, fish, eggs Sage-Meat, fish, poultry, onions, eggplant, tomateos, pork, stews Savory-Eggs, salads, poultry, meat, rice, vegetables, soups, pork Tarragon-Meat, poultry, fish, eggs, butter, vegetables (licorice-like flavor)  Thyme-Meat, poultry, fish, eggs, vegetables, (clover-like flavor), sauces, soups Tumeric-Salads, butter, eggs, fish, rice, vegetables (saffron-like flavor) Vanilla Extract-Baked goods, candy Vinegar-Salads, vegetables, meat marinades Walnut Extract-baked goods, candy  2. Choose your Foods Wisely   The following is a list of foods to avoid which are high in sodium:  Meats-Avoid all smoked, canned, salt cured, dried and kosher meat and fish as well as Anchovies   [employer] meats:Bologna, Liverwurst, Pastrami Canned meat or fish  Marinated herring Caviar    Pepperoni Corned Beef   Pizza Dried chipped beef  Salami Frozen breaded fish or meat Salt pork Frankfurters or hot dogs  Sardines Gefilte fish   Sausage Ham (boiled ham, Proscuitto Smoked butt    spiced ham)   Spam      TV Dinners Vegetables Canned vegetables (Regular) Relish Canned mushrooms  Sauerkraut Olives    Tomato juice Pickles  Bakery and Dessert Products Canned puddings  Cream pies Cheesecake   Decorated cakes Cookies  Beverages/Juices Tomato juice, regular  Gatorade   V-8 vegetable juice, regular  Breads and Cereals Biscuit mixes   Salted potato chips, corn  chips, pretzels Bread stuffing mixes  Salted crackers and rolls Pancake and waffle mixes Self-rising flour  Seasonings Accent    Meat sauces Barbecue sauce  Meat tenderizer Catsup    Monosodium glutamate (MSG) Celery salt   Onion salt Chili sauce   Prepared mustard Garlic salt   Salt, seasoned salt, sea salt Gravy mixes   Soy sauce Horseradish   Steak sauce Ketchup   Tartar sauce Lite salt    Teriyaki sauce Marinade mixes   Worcestershire sauce  Others Baking powder   Cocoa and cocoa mixes Baking soda   Commercial casserole mixes Candy-caramels, chocolate  Dehydrated soups    Bars, fudge,nougats  Instant rice and pasta mixes Canned broth or soup  Maraschino cherries Cheese, aged and processed cheese and cheese spreads  Learning Assessment Quiz  Indicated T (for True) or F (for False) for each of the following statements:  1. _____ Fresh fruits and vegetables and unprocessed grains are generally low in sodium 2. _____ Water may contain a considerable amount of sodium, depending on the source 3. _____ You can always tell if a food is high in sodium by tasting it 4. _____ Certain laxatives my be high in sodium and should be avoided unless prescribed   by a physician or pharmacist 5. _____ Salt substitutes may be used freely by anyone on a sodium restricted diet 6. _____ Sodium is present in table salt, food additives  and as a natural component of   most foods 7. _____ Table salt is approximately 90% sodium 8. _____ Limiting sodium intake may help prevent excess fluid accumulation in the body 9. _____ On a sodium-restricted diet, seasonings such as bouillon soy sauce, and    cooking wine should be used in place of table salt 10. _____ On an ingredient list, a product which lists monosodium glutamate as the first   ingredient is an appropriate food to include on a low sodium diet  Circle the best answer(s) to the following statements (Hint: there may be more than one correct  answer)  11. On a low-sodium diet, some acceptable snack items are:    A. Olives  F. Bean dip   K. Grapefruit juice    B. Salted Pretzels G. Commercial Popcorn   L. Canned peaches    C. Carrot Sticks  H. Bouillon   M. Unsalted nuts   D. [place] fries  I. Peanut butter crackers N. Salami   E. Sweet pickles J. Tomato Juice   O. Pizza  12.  Seasonings that may be used freely on a reduced - sodium diet include   A. Lemon wedges F.Monosodium glutamate K. Celery seed    B.Soysauce   G. Pepper   L. Mustard powder   C. Sea salt  H. Cooking wine  M. Onion flakes   D. Vinegar  E. Prepared horseradish N. Salsa   E. Sage   J. Worcestershire sauce  O. Chutney

## 2020-06-05 NOTE — Progress Notes (Signed)
Cardiology Office Note    Date:  06/05/2020   ID:  Naleigha, Raimondi November 21, 1945, MRN [id number]   PCP:  Binnie Rail, MD   [place]  Cardiologist:  Carlyle Dolly, MD  Advanced Practice Provider:  No care team member to display Electrophysiologist:  None   [phone]   Chief Complaint  Patient presents with  . Shortness of Breath    History of Present Illness:  Angela Simmons is a 75 y.o. female with history of PAF on Eliquis, HTN, HLD, DM2, cardiac catheterization 01/05/2020 due to chest pain. The left coronary system was normal but the right was unable to be engaged due to her anatomy and it was recommended she return for RCA angiography via a right femoral approach.Given no recurrent chest pain, a possible repeat cath was reviewed with Dr. Harl Bowie and he recommended holding off at that time given her prior normal NST and normal left system and only pursue if refractory anginal symptoms. She was bradycardiac, therefore Cardizem CD was reduced to 168m daily   Patient saw BBernerd Phoin follow-up 02/16/2020 at which time she was doing well without angina.  Patient called in 05/31/2020 complaining of shortness of breath and some dizziness and was added onto my schedule. Patient said she was getting short of breath with little activity. Last week was worse than this week. Was very busy taking care of husband in the hospital and house. Felt her heart race when she was short of breath. Some ankle swelling if on her feet all day.chronic knee swelling. Denies chest pain. BP up here but was normal at home. Eating fast food daily, canned hot dogs.  Past Medical History:  Diagnosis Date  . Breast cancer ([place]   right breast  . Collagen vascular disease ([place]   . Diabetes mellitus without complication ([place]   . GERD (gastroesophageal reflux disease)   . HTN (hypertension)   . Hypercholesterolemia   . Obesity   . Osteoarthritis     Past  Surgical History:  Procedure Laterality Date  . ABDOMINAL HY[place]ERECTOMY  [place]  . AORTIC ARCH ANGIOGRAPHY N/A 12/07/2019   Procedure: AORTIC ARCH ANGIOGRAPHY;  Surgeon: BLorretta Harp MD;  Location[facility];  Service: Cardiovascular;  Laterality: N/A;  . COLONOSCOPY  May 2003   Dr. STamala Julian normal  . COLONOSCOPY  06/22/2011   Procedure: COLONOSCOPY;  Surgeon: SDanie Binder MD;  Location[facility];  Service: Endoscopy;  Laterality: N/A;  10:30  . LEFT HEART CATH AND CORONARY ANGIOGRAPHY N/A 12/07/2019   Procedure: LEFT HEART CATH AND CORONARY ANGIOGRAPHY;  Surgeon: BLorretta Harp MD;  Location[facility];  Service: Cardiovascular;  Laterality: N/A;  . MASTECTOMY  1995   right     Current Medications: Current Meds  Medication Sig  . atorvastatin (LIPITOR) 10 MG tablet TAKE 1 TABLET BY MOUTH DAILY  . Blood Glucose Monitoring Suppl (ONETOUCH VERIO) w/Device KIT USE TO TEST BLOOD SUGAR ONCE DAILY AS DIRECTED  . diclofenac sodium (VOLTAREN) 1 % GEL Apply 3 g to 3 large joints up to 3 times daily.  .Marland Kitchendiltiazem (CARDIZEM CD) 120 MG 24 hr capsule Take 1 capsule (120 mg total) by mouth daily.  .Marland KitchenELIQUIS 5 MG TABS tablet TAKE 1 TABLET BY MOUTH TWICE DAILY  . fluticasone (FLONASE) 50 MCG/ACT nasal spray USE 2 SPRAYS IN EACH NOSTRIL EVERY DAY  . Ginger, Zingiber officinalis, 550  MG CAPS Take 1 capsule (550 mg total) by mouth daily.  Marland Kitchen glucose blood test strip USE TO TEST BLOOD SUGAR ONCE DAILY. E11.40  . hydroxychloroquine (PLAQUENIL) 200 MG tablet TAKE 1 TABLET BY MOUTH ONCE DAILY ON MONDAY-FRIDAY  . loratadine (CLARITIN) 10 MG tablet TAKE 1 TABLET BY MOUTH EVERY DAY  . metoprolol tartrate (LOPRESSOR) 25 MG tablet Take 1 tablet (25 mg total) by mouth 2 (two) times daily.  . Multiple Vitamins-Minerals (CENTRUM SILVER 50+WOMEN) TABS Take 1 tablet by mouth daily at 6 (six) AM.  . nitroGLYCERIN (NITROSTAT) 0.4 MG SL tablet Place 1 tablet (0.4 mg total)  under the tongue every 5 (five) minutes as needed for chest pain.  . Omega-3 Fatty Acids (FISH OIL) 1000 MG CAPS Take 1,000 mg by mouth daily.  Marland Kitchen omeprazole (PRILOSEC) 40 MG capsule TAKE ONE CAPSULE BY MOUTH DAILY  . ONETOUCH DELICA LANCETS 67R MISC USE TO TEST ONCE DAILY  . ONETOUCH VERIO test strip USE TO TEST BLOOD SUGAR ONCE DAILY  . Turmeric (QC TUMERIC COMPLEX) 500 MG CAPS Take 1 capsule by mouth daily.     Allergies:   Levofloxacin, Nitrofurantoin, Penicillins, Sulfonamide derivatives, and Pantoprazole   Social History   Socioeconomic History  . Marital status: Married    Spouse name: Not on file  . Number of children: 3  . Years of education: 7  . Highest education level: Not on file  Occupational History  . Not on file  Tobacco Use  . Smoking status: Never Smoker  . Smokeless tobacco: Never Used  Vaping Use  . Vaping Use: Never used  Substance and Sexual Activity  . Alcohol use: No  . Drug use: Never  . Sexual activity: Never    Birth control/protection: Rhythm  Other Topics Concern  . Not on file  Social History Narrative   Lives at home with husband   Caffeine, maybe 1 a day   Social Determinants of Health   Financial Resource Strain: Low Risk   . Difficulty of Paying Living Expenses: Not very hard  Food Insecurity: Not on file  Transportation Needs: Not on file  Physical Activity: Not on file  Stress: Not on file  Social Connections: Not on file     Family History:  The patient's family history includes Diabetes in her brother, brother, father, mother, sister, and sister; Emphysema in her father; Heart failure in her mother; Hypercholesterolemia in her brother, brother, sister, sister, sister, and sister; Hypertension in her brother, brother, and sister.   ROS:   Please see the history of present illness.    ROS All other systems reviewed and are negative.   PHYSICAL EXAM:   VS:  BP (!) 160/90   Pulse 68   Ht 5' 9"  (1.753 m)   Wt 193 lb (87.5 kg)    SpO2 98%   BMI 28.50 kg/m   Physical Exam  GEN: Well nourished, well developed, in no acute distress  Neck: no JVD, carotid bruits, or masses Cardiac:RRR; no murmurs, rubs, or gallops  Respiratory:  clear to auscultation bilaterally, normal work of breathing GI: soft, nontender, nondistended, + BS Ext: Trace of right ankle edema otherwise without cyanosis, clubbing,Good distal pulses bilaterally Neuro:  Alert and Oriented x 3 Psych: euthymic mood, full affect  Wt Readings from Last 3 Encounters:  06/05/20 193 lb (87.5 kg)  04/23/20 195 lb 6.4 oz (88.6 kg)  02/16/20 195 lb (88.5 kg)      Studies/Labs Reviewed:   EKG:  EKG is not ordered today.    Recent Labs: 07/25/2019: Magnesium 2.2 01/05/2020: B Natriuretic Peptide 107.0; TSH 1.515 04/23/2020: ALT 16; BUN 11; Creat 0.92; Hemoglobin 12.5; Platelets 224; Potassium 4.2; Sodium 139   Lipid Panel    Component Value Date/Time   CHOL 120 12/27/2019 1243   TRIG 41.0 12/27/2019 1243   HDL 46.70 12/27/2019 1243   CHOLHDL 3 12/27/2019 1243   VLDL 8.2 12/27/2019 1243   LDLCALC 65 12/27/2019 1243    Additional studies/ records that were reviewed today include:  Echocardiogram: 04/2019 IMPRESSIONS     1. Left ventricular ejection fraction, by estimation, is 60 to 65%. The  left ventricle has normal function. The left ventricle has no regional  wall motion abnormalities. There is mild left ventricular hypertrophy.  Left ventricular diastolic parameters  were normal.   2. Right ventricular systolic function is normal. The right ventricular  size is normal. There is mildly elevated pulmonary artery systolic  pressure.   3. The mitral valve is normal in structure. Trivial mitral valve  regurgitation. No evidence of mitral stenosis.   4. The aortic valve is tricuspid. Aortic valve regurgitation is not  visualized. No aortic stenosis is present.   5. The inferior vena cava is normal in size with greater than 50%  respiratory  variability, suggesting right atrial pressure of 3 mmHg.   Comparison(s): Stress Echocardiogram done 11/03/15 showed an EF of 60%.      Cardiac Catheterization: 11/2019 IMPRESSION: Angela Simmons has normal left coronary system.  She has an apparent takeoff RCA from close to the left main takeoff.  I was unable to selectively intubate this from the right radial approach.  I did use 180 cc of contrast and I aborted the procedure.  She will need to return for RCA angiography via the right femoral approach.  The sheath was removed and a TR band was placed on the right wrist to achieve patent hemostasis.  The patient left lab in stable condition.  She will be discharged home today as an outpatient.      Risk Assessment/Calculations:    CHA2DS2-VASc Score = 4  This indicates a 4.8% annual risk of stroke. The patient's score is based upon: CHF History: No HTN History: Yes Diabetes History: Yes Stroke History: No Vascular Disease History: No        ASSESSMENT:    1. DOE (dyspnea on exertion)   2. Paroxysmal atrial fibrillation (HCC)   3. Chest pain of uncertain etiology   4. Essential hypertension   5. Hyperlipidemia, unspecified hyperlipidemia type      PLAN:  In order of problems listed above:  Paroxysmal atrial fibrillation lower dose diltiazem because of bradycardia, also on metoprolol and Eliquis.  Patient having increased dyspnea on exertion associated with fast heart rates worse last week when she was busy trying to take care of her husband who was hospitalized.  A little bit better this week because not as active.  Suspect she is having elevated heart rates with activity.  Will place 2-week ZIO and follow-up.  No associated chest pain.  Mild ankle edema.  2 g sodium diet.  Labs stable 04/23/2020.  History of chest pain cardiac cath 11/2019 showed normal left system but the RCA was unable to be engaged.  Medical therapy recommended unless she has refractory symptoms.  No  recurrent chest pain.  Hypertension blood pressure up today but has been normal at home.  She is eating fast food daily.  Have instructed  her on a low-sodium diet.  Will not adjust medications today.  HLD on atorvastatin.  LDL 65 12/2019   Shared Decision Making/Informed Consent        Medication Adjustments/Labs and Tests Ordered: Current medicines are reviewed at length with the patient today.  Concerns regarding medicines are outlined above.  Medication changes, Labs and Tests ordered today are listed in the Patient Instructions below. Patient Instructions   Medication Instructions:  Your physician recommends that you continue on your current medications as directed. Please refer to the Current Medication list given to you today.  *If you need a refill on your cardiac medications before your next appointment, please call your pharmacy*   Lab Work: None If you have labs (blood work) drawn today and your tests are completely normal, you will receive your results only by: Marland Kitchen MyChart Message (if you have MyChart) OR . A paper copy in the mail If you have any lab test that is abnormal or we need to change your treatment, we will call you to review the results.   Follow-Up: At [facility], you and your health needs are our priority.  As part of our continuing mission to provide you with exceptional heart care, we have created designated Provider Care Teams.  These Care Teams include your primary Cardiologist (physician) and Advanced Practice Providers (APPs -  Physician Assistants and Nurse Practitioners) who all work together to provide you with the care you need, when you need it.  We recommend signing up for the patient portal called "MyChart".  Sign up information is provided on this After Visit Summary.  MyChart is used to connect with patients for Virtual Visits (Telemedicine).  Patients are able to view lab/test results, encounter notes, upcoming appointments, etc.  Non-urgent  messages can be sent to your provider as well.   To learn more about what you can do with MyChart, go to [email or web address].    Your next appointment:   1 month(s)  The format for your next appointment:   In Person  Provider:   You will see one of the following Advanced Practice Providers on your designated Care Team:    Bernerd Pho, PA-C   Ermalinda Barrios, PA-C   Other Instructions [place] Monitor Instructions   Your physician has requested you wear a ZIO patch monitor for _14__ days.  This is a single patch monitor.   [employer] supplies one patch monitor per enrollment. Additional stickers are not available. Please do not apply patch if you will be having a Nuclear Stress Test, Echocardiogram, Cardiac CT, MRI, or Chest Xray during the period you would be wearing the monitor. The patch cannot be worn during these tests. You cannot remove and re-apply the ZIO XT patch monitor.   Billing and Patient Assistance Program Information   We have supplied [employer] with any of your insurance information on file for billing purposes. [employer] offers a sliding scale Patient Assistance Program for patients that do not have insurance, or whose insurance does not completely cover the cost of the ZIO monitor.   You must apply for the Patient Assistance Program to qualify for this discounted rate.     To apply, please call [employer] at [phone], select option 4, then select option 2, and ask to apply for Patient Assistance Program.  Theodore Demark will ask your household income, and how many people are in your household.  They will quote your out-of-pocket cost based on that information.  [employer] will also be able  to set up a 61-month interest-free payment plan if needed.  When you are ready to remove the patch, follow instructions on the last 2 pages of the Patient Logbook. Stick patch monitor onto the last page of Patient Logbook.  Place Patient Logbook in the blue and white box.  Use locking  tab on box and tape box closed securely.  The blue and white box has prepaid postage on it. Please place it in the mailbox as soon as possible. Your physician should have your test results approximately 7 days after [facility]  Call I[facility]at [phone] you have questions regarding your ZIO XT patch monitor. Call them immediately if you see an orange light blinking on your monitor.  If your monitor falls off in less than 4 days, contact our Monitor department at [phone] ?If your monitor becomes loose or falls off after 4 days call [employer] at [phone] suggestions on securing your monitor.?  Two Gram Sodium Diet 2000 mg  What is Sodium? Sodium is a mineral found naturally in many foods. The most significant source of sodium in the diet is table salt, which is about 40% sodium.  Processed, convenience, and preserved foods also contain a large amount of sodium.  The body needs only 500 mg of sodium daily to function,  A normal diet provides more than enough sodium even if you do not use salt.  Why Limit Sodium? A build up of sodium in the body can cause thirst, increased blood pressure, shortness of breath, and water retention.  Decreasing sodium in the diet can reduce edema and risk of heart attack or stroke associated with high blood pressure.  Keep in mind that there are many other factors involved in these health problems.  Heredity, obesity, lack of exercise, cigarette smoking, stress and what you eat all play a role.  General Guidelines:  Do not add salt at the table or in cooking.  One teaspoon of salt contains over 2 grams of sodium.  Read food labels  Avoid processed and convenience foods  Ask your dietitian before eating any foods not dicussed in the menu planning guidelines  Consult your physician if you wish to use a salt substitute or a sodium containing medication such as antacids.  Limit milk and milk  products to 16 oz (2 cups) per day.  Shopping Hints:  READ LABELS!! "Dietetic" does not necessarily mean low sodium.  Salt and other sodium ingredients are often added to foods during processing.   Menu Planning Guidelines Food Group Choose More Often Avoid  Beverages (see also the milk group All fruit juices, low-sodium, salt-free vegetables juices, low-sodium carbonated beverages Regular vegetable or tomato juices, commercially softened water used for drinking or cooking  Breads and Cereals Enriched white, wheat, rye and pumpernickel bread, hard rolls and dinner rolls; muffins, cornbread and waffles; most dry cereals, cooked cereal without added salt; unsalted crackers and breadsticks; low sodium or homemade bread crumbs Bread, rolls and crackers with salted tops; quick breads; instant hot cereals; pancakes; commercial bread stuffing; self-rising flower and biscuit mixes; regular bread crumbs or cracker crumbs  Desserts and Sweets Desserts and sweets mad with mild should be within allowance Instant pudding mixes and cake mixes  Fats Butter or margarine; vegetable oils; unsalted salad dressings, regular salad dressings limited to 1 Tbs; light, sour and heavy cream Regular salad dressings containing bacon fat, bacon bits, and salt pork; snack dips made with instant  soup mixes or processed cheese; salted nuts  Fruits Most fresh, frozen and canned fruits Fruits processed with salt or sodium-containing ingredient (some dried fruits are processed with sodium sulfites        Vegetables Fresh, frozen vegetables and low- sodium canned vegetables Regular canned vegetables, sauerkraut, pickled vegetables, and others prepared in brine; frozen vegetables in sauces; vegetables seasoned with ham, bacon or salt pork  Condiments, Sauces, Miscellaneous  Salt substitute with physician's approval; pepper, herbs, spices; vinegar, lemon or lime juice; hot pepper sauce; garlic powder, onion powder, low sodium soy  sauce (1 Tbs.); low sodium condiments (ketchup, chili sauce, mustard) in limited amounts (1 tsp.) fresh ground horseradish; unsalted tortilla chips, pretzels, potato chips, popcorn, salsa (1/4 cup) Any seasoning made with salt including garlic salt, celery salt, onion salt, and seasoned salt; sea salt, rock salt, kosher salt; meat tenderizers; monosodium glutamate; mustard, regular soy sauce, barbecue, sauce, chili sauce, teriyaki sauce, steak sauce, Worcestershire sauce, and most flavored vinegars; canned gravy and mixes; regular condiments; salted snack foods, olives, picles, relish, horseradish sauce, catsup   Food preparation: Try these seasonings Meats:    Pork Sage, onion Serve with applesauce  Chicken Poultry seasoning, thyme, parsley Serve with cranberry sauce  Lamb Curry powder, rosemary, garlic, thyme Serve with mint sauce or jelly  Veal Marjoram, basil Serve with current jelly, cranberry sauce  Beef Pepper, bay leaf Serve with dry mustard, unsalted chive butter  Fish Bay leaf, dill Serve with unsalted lemon butter, unsalted parsley butter  Vegetables:    Asparagus Lemon juice   Broccoli Lemon juice   Carrots Mustard dressing parsley, mint, nutmeg, glazed with unsalted butter and sugar   Green beans Marjoram, lemon juice, nutmeg,dill seed   Tomatoes Basil, marjoram, onion   Spice /blend for [employer]" 4 tsp ground thyme 1 tsp ground sage 3 tsp ground rosemary 4 tsp ground marjoram   Test your knowledge 1. A product that says "Salt Free" may still contain sodium. True or False 2. Garlic Powder and Hot Pepper Sauce an be used as alternative seasonings.True or False 3. Processed foods have more sodium than fresh foods.  True or False 4. Canned Vegetables have less sodium than froze True or False  WAYS TO DECREASE YOUR SODIUM INTAKE 1. Avoid the use of added salt in cooking and at the table.  Table salt (and other prepared seasonings which contain salt) is probably one of the  greatest sources of sodium in the diet.  Unsalted foods can gain flavor from the sweet, sour, and butter taste sensations of herbs and spices.  Instead of using salt for seasoning, try the following seasonings with the foods listed.  Remember: how you use them to enhance natural food flavors is limited only by your creativity... Allspice-Meat, fish, eggs, fruit, peas, red and yellow vegetables Almond Extract-Fruit baked goods Anise Seed-Sweet breads, fruit, carrots, beets, cottage cheese, cookies (tastes like licorice) Basil-Meat, fish, eggs, vegetables, rice, vegetables salads, soups, sauces Bay Leaf-Meat, fish, stews, poultry Burnet-Salad, vegetables (cucumber-like flavor) Caraway Seed-Bread, cookies, cottage cheese, meat, vegetables, cheese, rice Cardamon-Baked goods, fruit, soups Celery Powder or seed-Salads, salad dressings, sauces, meatloaf, soup, bread.Do not use  celery salt Chervil-Meats, salads, fish, eggs, vegetables, cottage cheese (parsley-like flavor) Chili Power-Meatloaf, chicken cheese, corn, eggplant, egg dishes Chives-Salads cottage cheese, egg dishes, soups, vegetables, sauces Cilantro-Salsa, casseroles Cinnamon-Baked goods, fruit, pork, lamb, chicken, carrots Cloves-Fruit, baked goods, fish, pot roast, green beans, beets, carrots Coriander-Pastry, cookies, meat, salads, cheese (lemon-orange flavor) Cumin-Meatloaf, fish,cheese, eggs,  cabbage,fruit pie (caraway flavor) [employer], fruit, eggs, fish, poultry, cottage cheese, vegetables Dill Seed-Meat, cottage cheese, poultry, vegetables, fish, salads, bread Fennel Seed-Bread, cookies, apples, pork, eggs, fish, beets, cabbage, cheese, Licorice-like flavor Garlic-(buds or powder) Salads, meat, poultry, fish, bread, butter, vegetables, potatoes.Do not  use garlic salt Ginger-Fruit, vegetables, baked goods, meat, fish, poultry Horseradish Root-Meet, vegetables, butter Lemon Juice or Extract-Vegetables, fruit, tea, baked  goods, fish salads Mace-Baked goods fruit, vegetables, fish, poultry (taste like nutmeg) Maple Extract-Syrups Marjoram-Meat, chicken, fish, vegetables, breads, green salads (taste like Sage) Mint-Tea, lamb, sherbet, vegetables, desserts, carrots, cabbage Mustard, Dry or Seed-Cheese, eggs, meats, vegetables, poultry Nutmeg-Baked goods, fruit, chicken, eggs, vegetables, desserts Onion Powder-Meat, fish, poultry, vegetables, cheese, eggs, bread, rice salads (Do not use   Onion salt) Orange Extract-Desserts, baked goods Oregano-Pasta, eggs, cheese, onions, pork, lamb, fish, chicken, vegetables, green salads Paprika-Meat, fish, poultry, eggs, cheese, vegetables Parsley Flakes-Butter, vegetables, meat fish, poultry, eggs, bread, salads (certain forms may   Contain sodium Pepper-Meat fish, poultry, vegetables, eggs Peppermint Extract-Desserts, baked goods Poppy Seed-Eggs, bread, cheese, fruit dressings, baked goods, noodles, vegetables, cottage  [employer], poultry, meat, fish, cauliflower, turnips,eggs bread Saffron-Rice, bread, veal, chicken, fish, eggs Sage-Meat, fish, poultry, onions, eggplant, tomateos, pork, stews Savory-Eggs, salads, poultry, meat, rice, vegetables, soups, pork Tarragon-Meat, poultry, fish, eggs, butter, vegetables (licorice-like flavor)  Thyme-Meat, poultry, fish, eggs, vegetables, (clover-like flavor), sauces, soups Tumeric-Salads, butter, eggs, fish, rice, vegetables (saffron-like flavor) Vanilla Extract-Baked goods, candy Vinegar-Salads, vegetables, meat marinades Walnut Extract-baked goods, candy  2. Choose your Foods Wisely   The following is a list of foods to avoid which are high in sodium:  Meats-Avoid all smoked, canned, salt cured, dried and kosher meat and fish as well as Anchovies   [employer] meats:Bologna, Liverwurst, Pastrami Canned meat or fish  Marinated herring Caviar    Pepperoni Corned  Beef   Pizza Dried chipped beef  Salami Frozen breaded fish or meat Salt pork Frankfurters or hot dogs  Sardines Gefilte fish   Sausage Ham (boiled ham, Proscuitto Smoked butt    spiced ham)   Spam      TV Dinners Vegetables Canned vegetables (Regular) Relish Canned mushrooms  Sauerkraut Olives    Tomato juice Pickles  Bakery and Dessert Products Canned puddings  Cream pies Cheesecake   Decorated cakes Cookies  Beverages/Juices Tomato juice, regular  Gatorade   V-8 vegetable juice, regular  Breads and Cereals Biscuit mixes   Salted potato chips, corn chips, pretzels Bread stuffing mixes  Salted crackers and rolls Pancake and waffle mixes Self-rising flour  Seasonings Accent    Meat sauces Barbecue sauce  Meat tenderizer Catsup    Monosodium glutamate (MSG) Celery salt   Onion salt Chili sauce   Prepared mustard Garlic salt   Salt, seasoned salt, sea salt Gravy mixes   Soy sauce Horseradish   Steak sauce Ketchup   Tartar sauce Lite salt    Teriyaki sauce Marinade mixes   Worcestershire sauce  Others Baking powder   Cocoa and cocoa mixes Baking soda   Commercial casserole mixes Candy-caramels, chocolate  Dehydrated soups    Bars, fudge,nougats  Instant rice and pasta mixes Canned broth or soup  Maraschino cherries Cheese, aged and processed cheese and cheese spreads  Learning Assessment Quiz  Indicated T (for True) or F (for False) for each of the following statements:  1. _____ Fresh fruits and vegetables and unprocessed grains are generally low in sodium 2. _____ [employer]  may contain a considerable amount of sodium, depending on the source 3. _____ You can always tell if a food is high in sodium by tasting it 4. _____ Certain laxatives my be high in sodium and should be avoided unless prescribed   by a physician or pharmacist 5. _____ Salt substitutes may be used freely by anyone on a sodium restricted diet 6. _____ Sodium is present in table salt, food  additives and as a natural component of   most foods 7. _____ Table salt is approximately 90% sodium 8. _____ Limiting sodium intake may help prevent excess fluid accumulation in the body 9. _____ On a sodium-restricted diet, seasonings such as bouillon soy sauce, and    cooking wine should be used in place of table salt 10. _____ On an ingredient list, a product which lists monosodium glutamate as the first   ingredient is an appropriate food to include on a low sodium diet  Circle the best answer(s) to the following statements (Hint: there may be more than one correct answer)  11. On a low-sodium diet, some acceptable snack items are:    A. Olives  F. Bean dip   K. Grapefruit juice    B. Salted Pretzels G. Commercial Popcorn   L. Canned peaches    C. Carrot Sticks  H. Bouillon   M. Unsalted nuts   D. [place] fries  I. Peanut butter crackers N. Salami   E. Sweet pickles J. Tomato Juice   O. Pizza  12.  Seasonings that may be used freely on a reduced - sodium diet include   A. Lemon wedges F.Monosodium glutamate K. Celery seed    B.Soysauce   G. Pepper   L. Mustard powder   C. Sea salt  H. Cooking wine  M. Onion flakes   D. Vinegar  E. Prepared horseradish N. Salsa   E. Sage   J. Worcestershire sauce  O. [address]      Sumner Boast, PA-C  06/05/2020 11:49 AM    [facility] [address], [place], [place]  [address] Phone: [phone]; Fax: [phone]

## 2020-06-15 ENCOUNTER — Other Ambulatory Visit: Payer: Self-pay | Admitting: Cardiology

## 2020-06-18 NOTE — Progress Notes (Signed)
A call was made to Angela Simmons to ask if she is still taking the benazepril 40 mg. Patient states that she is still taking the benazepril which was last fill at [facility] drug on 04/27/20 90 ds. Patient stated that the only change with the benazepril is that she stopped taking at night because she was having bad dreams and now she takes in the morning.  [place] Pharmacist Assistant [phone]  Time spent:12

## 2020-06-26 DIAGNOSIS — I4891 Unspecified atrial fibrillation: Secondary | ICD-10-CM | POA: Diagnosis not present

## 2020-06-27 DIAGNOSIS — M79673 Pain in unspecified foot: Secondary | ICD-10-CM | POA: Diagnosis not present

## 2020-06-27 DIAGNOSIS — B351 Tinea unguium: Secondary | ICD-10-CM | POA: Diagnosis not present

## 2020-06-27 DIAGNOSIS — L84 Corns and callosities: Secondary | ICD-10-CM | POA: Diagnosis not present

## 2020-06-27 DIAGNOSIS — E1142 Type 2 diabetes mellitus with diabetic polyneuropathy: Secondary | ICD-10-CM | POA: Diagnosis not present

## 2020-06-28 ENCOUNTER — Other Ambulatory Visit: Payer: Self-pay

## 2020-06-28 ENCOUNTER — Ambulatory Visit (INDEPENDENT_AMBULATORY_CARE_PROVIDER_SITE_OTHER): Payer: PPO | Admitting: Internal Medicine

## 2020-06-28 ENCOUNTER — Encounter: Payer: Self-pay | Admitting: Internal Medicine

## 2020-06-28 ENCOUNTER — Other Ambulatory Visit (INDEPENDENT_AMBULATORY_CARE_PROVIDER_SITE_OTHER): Payer: PPO

## 2020-06-28 VITALS — BP 146/72 | HR 62 | Temp 98.1°F | Ht 69.0 in | Wt 188.0 lb

## 2020-06-28 DIAGNOSIS — E1140 Type 2 diabetes mellitus with diabetic neuropathy, unspecified: Secondary | ICD-10-CM

## 2020-06-28 DIAGNOSIS — E7849 Other hyperlipidemia: Secondary | ICD-10-CM | POA: Diagnosis not present

## 2020-06-28 DIAGNOSIS — I10 Essential (primary) hypertension: Secondary | ICD-10-CM

## 2020-06-28 DIAGNOSIS — K219 Gastro-esophageal reflux disease without esophagitis: Secondary | ICD-10-CM | POA: Diagnosis not present

## 2020-06-28 DIAGNOSIS — I480 Paroxysmal atrial fibrillation: Secondary | ICD-10-CM | POA: Diagnosis not present

## 2020-06-28 DIAGNOSIS — I700 Atherosclerosis of aorta: Secondary | ICD-10-CM

## 2020-06-28 LAB — COMPREHENSIVE METABOLIC PANEL
ALT: 15 U/L (ref 0–35)
AST: 16 U/L (ref 0–37)
Albumin: 4.1 g/dL (ref 3.5–5.2)
Alkaline Phosphatase: 69 U/L (ref 39–117)
BUN: 12 mg/dL (ref 6–23)
CO2: 28 mEq/L (ref 19–32)
Calcium: 9.4 mg/dL (ref 8.4–10.5)
Chloride: 103 mEq/L (ref 96–112)
Creatinine, Ser: 0.96 mg/dL (ref 0.40–1.20)
GFR: 58.46 mL/min — ABNORMAL LOW (ref >60.00)
Glucose, Bld: 80 mg/dL (ref 70–99)
Potassium: 4 mEq/L (ref 3.5–5.1)
Sodium: 140 mEq/L (ref 135–145)
Total Bilirubin: 0.5 mg/dL (ref 0.2–1.2)
Total Protein: 7.9 g/dL (ref 6.0–8.3)

## 2020-06-28 LAB — HEMOGLOBIN A1C: Hgb A1c MFr Bld: 6.3 % (ref 4.6–6.5)

## 2020-06-28 LAB — LIPID PANEL
Cholesterol: 132 mg/dL (ref 0–200)
HDL: 46.7 mg/dL (ref >39.00)
LDL Cholesterol: 76 mg/dL (ref 0–99)
NonHDL: 84.84
Total CHOL/HDL Ratio: 3
Triglycerides: 44 mg/dL (ref 0.0–149.0)
VLDL: 8.8 mg/dL (ref 0.0–40.0)

## 2020-06-28 NOTE — Assessment & Plan Note (Addendum)
Chronic In sinus rhythm today Following with cardiology-just wore a Holter monitor to see the amount of atrial fibrillation Recent CBC normal Continue Eliquis and metoprolol

## 2020-06-28 NOTE — Assessment & Plan Note (Signed)
Chronic GERD controlled Continue omeprazole 40 mg daily 

## 2020-06-28 NOTE — Progress Notes (Signed)
Subjective:    Patient ID: Angela Simmons, female    DOB: [birth year], 75 y.o.   MRN: [id number]  HPI The patient is here for follow up of their chronic medical problems, including htn, DM, hld, gerd.  She is here with her daughter.  She is not exercising regularly.   She is very active at home.  Her husband has had some medical issues and she is doing much more around the house.  Last night she woke up in the middle of the night with a headache that was in the front and back of her head.  She checked her blood pressure and it was very elevated - 176/94.  She took something for the headache and put an ice pack on her head and when she got up in the morning her blood pressure was 134/80 after taking her medication.  She states she has been under more stress.  She did eat some potato chips last night.  She thinks she took her medication but not 100% sure on that.  She has checked her blood pressure and she brought some readings with her.  Her systolic typically has been less than 145.  Her diastolic has been over 90 twice.     Medications and allergies reviewed with patient and updated if appropriate.  Patient Active Problem List   Diagnosis Date Noted   Aortic atherosclerosis ([place]) 06/28/2020   Secondary hypercoagulable state ([place]) 12/26/2019   Anticoagulated 12/05/2019   PAF (paroxysmal atrial fibrillation) ([place]) 08/04/2019   Thrush 08/02/2019   Community acquired pneumonia 07/22/2019   DOE (dyspnea on exertion) 04/03/2019   Ankle swelling, left 04/03/2019   Hyperpigmented skin lesion 04/03/2019   Poor balance 12/06/2018   Headache 12/06/2018   Vertigo 06/07/2018   Urinary frequency 06/07/2018   Lower abdominal pain 06/07/2018   Herpes zoster without complication 44/97/5300   Tingling in extremities 11/26/2017   Ischial bursitis of left side 06/02/2017   Sicca syndrome ([place]) 06/23/2016   Bilateral leg edema 04/07/2016   Burning sensation of skin 04/07/2016   PMR  (polymyalgia rheumatica) ([place]) 03/27/2016   Osteopenia 03/04/2016   Autoimmune disease ([place]) 03/18/2015   Inflammatory arthritis 03/18/2015   Joint pain 11/22/2014   Overweight (BMI 25.0-29.9) 08/18/2014   Low back pain with radiation 08/18/2014   Hyperlipidemia 06/28/2014   Chest pain with moderate risk of acute coronary syndrome 04/11/2014   Tendonitis of shoulder 08/29/2013   S/P right mastectomy 11/14/2012   Type 2 diabetes, controlled, with neuropathy ([place]) 10/05/2012   Allergic rhinitis 01/26/2011   GERD 11/10/2009   INTERSTITIAL CYSTITIS 10/31/2008   BENIGN NEOPLASM OF VULVA 02/01/2008   Essential hypertension, benign 02/08/2007   History of cancer of right breast 02/08/2007    Current Outpatient Medications on File Prior to Visit  Medication Sig Dispense Refill   atorvastatin (LIPITOR) 10 MG tablet TAKE 1 TABLET BY MOUTH DAILY 90 tablet 0   benazepril (LOTENSIN) 40 MG tablet Take 40 mg by mouth daily.     Blood Glucose Monitoring Suppl (ONETOUCH VERIO) w/Device KIT USE TO TEST BLOOD SUGAR ONCE DAILY AS DIRECTED 1 kit 0   diclofenac sodium (VOLTAREN) 1 % GEL Apply 3 g to 3 large joints up to 3 times daily. 3 Tube 3   diltiazem (CARDIZEM CD) 120 MG 24 hr capsule Take 1 capsule (120 mg total) by mouth daily. 90 capsule 3   ELIQUIS 5 MG TABS tablet TAKE 1 TABLET BY MOUTH TWICE DAILY 60 tablet  6   fluticasone (FLONASE) 50 MCG/ACT nasal spray USE 2 SPRAYS IN EACH NOSTRIL EVERY DAY 48 g 0   Ginger, Zingiber officinalis, 550 MG CAPS Take 1 capsule (550 mg total) by mouth daily. 60 capsule 0   glucose blood test strip USE TO TEST BLOOD SUGAR ONCE DAILY. E11.40 100 each 3   hydroxychloroquine (PLAQUENIL) 200 MG tablet TAKE 1 TABLET BY MOUTH ONCE DAILY ON MONDAY-FRIDAY 60 tablet 0   loratadine (CLARITIN) 10 MG tablet TAKE 1 TABLET BY MOUTH EVERY DAY 30 tablet 2   metoprolol tartrate (LOPRESSOR) 25 MG tablet TAKE 1 TABLET BY MOUTH TWICE DAILY 60 tablet 6   Multiple Vitamins-Minerals  (CENTRUM SILVER 50+WOMEN) TABS Take 1 tablet by mouth daily at 6 (six) AM.     nitroGLYCERIN (NITROSTAT) 0.4 MG SL tablet Place 1 tablet (0.4 mg total) under the tongue every 5 (five) minutes as needed for chest pain. 25 tablet 3   Omega-3 Fatty Acids (FISH OIL) 1000 MG CAPS Take 1,000 mg by mouth daily.     omeprazole (PRILOSEC) 40 MG capsule TAKE ONE CAPSULE BY MOUTH DAILY 90 capsule 3   ONETOUCH DELICA LANCETS 69C MISC USE TO TEST ONCE DAILY 100 each 2   ONETOUCH VERIO test strip USE TO TEST BLOOD SUGAR ONCE DAILY 100 each 0   Turmeric (QC TUMERIC COMPLEX) 500 MG CAPS Take 1 capsule by mouth daily.     No current facility-administered medications on file prior to visit.    Past Medical History:  Diagnosis Date   Breast cancer [facility]) 1990s   right breast   Collagen vascular disease ([place])    Diabetes mellitus without complication ([place])    GERD (gastroesophageal reflux disease)    HTN (hypertension)    Hypercholesterolemia    Obesity    Osteoarthritis     Past Surgical History:  Procedure Laterality Date   ABDOMINAL HY[place]ERECTOMY  [place]   [place]   AORTIC ARCH ANGIOGRAPHY N/A 12/07/2019   Procedure: AORTIC ARCH ANGIOGRAPHY;  Surgeon: Lorretta Harp, MD;  Location: [facility];  Service: Cardiovascular;  Laterality: N/A;   COLONOSCOPY  May 2003   Dr. Tamala Julian: normal   COLONOSCOPY  06/22/2011   Procedure: COLONOSCOPY;  Surgeon: Danie Binder, MD;  Location[facility];  Service: Endoscopy;  Laterality: N/A;  10:30   LEFT HEART CATH AND CORONARY ANGIOGRAPHY N/A 12/07/2019   Procedure: LEFT HEART CATH AND CORONARY ANGIOGRAPHY;  Surgeon: Lorretta Harp, MD;  Location: [place] [facility];  Service: Cardiovascular;  Laterality: N/A;   MASTECTOMY  1995   right     Social History   Socioeconomic History   Marital status: Married    Spouse name: Not on file   Number of children: 3   Years of education: 7   Highest education level: Not on file  Occupational  History   Not on file  Tobacco Use   Smoking status: Never   Smokeless tobacco: Never  Vaping Use   Vaping Use: Never used  Substance and Sexual Activity   Alcohol use: No   Drug use: Never   Sexual activity: Never    Birth control/protection: Rhythm  Other Topics Concern   Not on file  Social History Narrative   Lives at home with husband   Caffeine, maybe 1 a day   Social Determinants of Health   Financial Resource Strain: Low Risk    Difficulty of Paying Living Expenses: Not very hard  Food  Insecurity: Not on file  Transportation Needs: Not on file  Physical Activity: Not on file  Stress: Not on file  Social Connections: Not on file    Family History  Problem Relation Age of Onset   Diabetes Mother        diabetic coma   Heart failure Mother    Diabetes Father    Emphysema Father    Diabetes Sister    Hypercholesterolemia Sister    Hypercholesterolemia Brother    Diabetes Brother    Hypertension Brother    Hypercholesterolemia Brother    Diabetes Sister    Hypertension Sister    Hypercholesterolemia Sister    Hypercholesterolemia Sister    Hypercholesterolemia Sister    Diabetes Brother    Hypertension Brother    Colon cancer Neg Hx     Review of Systems  Constitutional:  Positive for fatigue. Negative for fever.  Respiratory:  Positive for cough (few days) and shortness of breath (chronic). Negative for wheezing.   Cardiovascular:  Positive for chest pain (light last night) and leg swelling (intermittent). Negative for palpitations.  Neurological:  Positive for light-headedness (occ) and headaches.      Objective:   Vitals:   06/28/20 1152  BP: (!) 146/72  Pulse: 62  Temp: 98.1 F (36.7 C)  SpO2: 99%   BP Readings from Last 3 Encounters:  06/28/20 (!) 146/72  06/05/20 (!) 160/90  04/23/20 (!) 180/83   Wt Readings from Last 3 Encounters:  06/28/20 188 lb (85.3 kg)  06/05/20 193 lb (87.5 kg)  04/23/20 195 lb 6.4 oz (88.6 kg)   Body mass  index is 27.76 kg/m.   Physical Exam    Constitutional: Appears well-developed and well-nourished. No distress.  HENT:  Head: Normocephalic and atraumatic.  Neck: Neck supple. No tracheal deviation present. No thyromegaly present.  No cervical lymphadenopathy Cardiovascular: Normal rate, regular rhythm and normal heart sounds.   No murmur heard. No carotid bruit .  No edema Pulmonary/Chest: Effort normal and breath sounds normal. No respiratory distress. No has no wheezes. No rales.  Skin: Skin is warm and dry. Not diaphoretic.  Psychiatric: Normal mood and affect. Behavior is normal.      Assessment & Plan:    See Problem List for Assessment and Plan of chronic medical problems.    This visit occurred during the SARS-CoV-2 public health emergency.  Safety protocols were in place, including screening questions prior to the visit, additional usage of staff PPE, and extensive cleaning of exam room while observing appropriate contact time as indicated for disinfecting solutions.

## 2020-06-28 NOTE — Patient Instructions (Addendum)
  Blood work was ordered.     Medications changes include :   none    Monitor  your BP closely - goal is less than 145/90   Please followup in 6 months

## 2020-06-28 NOTE — Assessment & Plan Note (Addendum)
Chronic Continue atorvastatin 10 mg daily LDL has been at goal of less than 70 Continue healthy diet and regular exercise Check lipid panel, CMP

## 2020-06-28 NOTE — Assessment & Plan Note (Signed)
Chronic Diet controlled Check A1c Stressed diabetic diet and regular exercise

## 2020-06-28 NOTE — Assessment & Plan Note (Addendum)
Chronic BP?  Well controlled She did have very high BP last night in the middle of the night that was associated with a headache.  Unsure of the cause-she did have potato chips last night.  She thinks she took her medication last night, but is not 100% sure.  Most of her other readings from home are adequately controlled Continue benazepril 40 mg daily, diltiazem 120 mg daily, metoprolol 25 mg twice daily  cmp  She will monitor her blood pressure closely over the next couple of weeks and call if her blood pressure is not well controlled-would consider adding hydralazine twice daily and 10 mg.  She would like to avoid diuretic if possible.  Her heart rate is typically in the 50s at home so would avoid anything that affected her heart rate.

## 2020-06-28 NOTE — Assessment & Plan Note (Signed)
Chronic Has been well controlled Check lipid panel  Continue atorvastatin 10 mg daily Regular exercise and healthy diet encouraged

## 2020-07-08 ENCOUNTER — Telehealth: Payer: Self-pay | Admitting: Rheumatology

## 2020-07-08 NOTE — Telephone Encounter (Signed)
Please clarify if she has tried icing, elevating, or using compression?  Has she tried taking tylenol for pain relief?

## 2020-07-08 NOTE — Telephone Encounter (Signed)
Patient states she is using ice and getting some relief. Patient states she has taken some tylenol and it helped. Patient states she has not taken any tylenol today. Patient states she is also elevating her leg. Patient states she is not using compression today but used the compression stocking yesterday. Patient states this has been going on since Friday 07/12/2020. Please advise.

## 2020-07-08 NOTE — Telephone Encounter (Signed)
Patient calling in reference to swollen left knee. Patient went to PT for nine days, and it did seem to help. Patient thinks she bumped leg, now it is swollen. Swelling started Friday after patient was in the car for awhile with the air on. Leg got cold, then it was hard to walk on after that.

## 2020-07-08 NOTE — Telephone Encounter (Signed)
Attempted to contact the patient and left message for patient to call the office.  

## 2020-07-09 ENCOUNTER — Telehealth: Payer: Self-pay | Admitting: Pharmacist

## 2020-07-09 NOTE — Telephone Encounter (Signed)
Attempted to contact the patient and unable to leave a message.  

## 2020-07-09 NOTE — Progress Notes (Signed)
Cardiology Office Note    Date:  07/15/2020   ID:  Valjean, Ruppel [birth year], MRN [id number]   PCP:  Binnie Rail, MD   [place]  Cardiologist:  Carlyle Dolly, MD   Advanced Practice Provider:  No care team member to display Electrophysiologist:  None   [phone]   No chief complaint on file.   History of Present Illness:  Angela Simmons is a 75 y.o. female with history of PAF on Eliquis, HTN, HLD, DM2, cardiac catheterization 01/05/2020 due to chest pain. The left coronary system was normal but the right was unable to be engaged due to her anatomy and it was recommended she return for RCA angiography via a right femoral approach.Given no recurrent chest pain, a possible repeat cath was reviewed with Dr. Harl Bowie and he recommended holding off at that time given her prior normal NST and normal left system and only pursue if refractory anginal symptoms. She was bradycardiac, therefore Cardizem CD was reduced to 138m daily     I saw patient 06/05/2020 with increased dyspnea on exertion associated with fast heart rates.  Placed on ZIO monitor: Minimum heart rate 41 max 174 mostly sinus rhythm but 27 SVT runs occurred fastest 174 bpm lasting 5 beats the longest was 12 beats average rate of 113 bpm, she did have some ventricular bigeminy.  Patient comes in accompanied by her sister. She says DOE and heart palpitations have improved. BP up today but has been pretty well controlled at home.  Has occasional chest soreness after lifting things. Getting extra salt in her diet.   Past Medical History:  Diagnosis Date   Breast cancer ([facility] 1990s   right breast   [place]    Diabetes mellitus without complication ([facility]    GERD (gastroesophageal reflux disease)    HTN (hypertension)    Hypercholesterolemia    Obesity    Osteoarthritis     Past Surgical History:  Procedure Laterality Date   ABDOMINAL [place]CTOMY  [place]   AORTIC ARCH ANGIOGRAPHY N/A 12/07/2019   Procedure: AORTIC ARCH ANGIOGRAPHY;  Surgeon: BLorretta Harp MD;  [facility];  Service: Cardiovascular;  Laterality: N/A;   COLONOSCOPY  May 2003   Dr. STamala Julian normal   COLONOSCOPY  06/22/2011   Procedure: COLONOSCOPY;  Surgeon: SDanie Binder MD;  [facility];  Service: Endoscopy;  Laterality: N/A;  10:30   LEFT HEART CATH AND CORONARY ANGIOGRAPHY N/A 12/07/2019   Procedure: LEFT HEART CATH AND CORONARY ANGIOGRAPHY;  Surgeon: BLorretta Harp MD;  [facility];  Service: Cardiovascular;  Laterality: N/A;   MASTECTOMY  1995   right     Current Medications: Current Meds  Medication Sig   atorvastatin (LIPITOR) 10 MG tablet TAKE 1 TABLET BY MOUTH DAILY   benazepril (LOTENSIN) 40 MG tablet Take 40 mg by mouth daily.   Blood Glucose Monitoring Suppl (ONETOUCH VERIO) w/Device KIT USE TO TEST BLOOD SUGAR ONCE DAILY AS DIRECTED   diclofenac sodium (VOLTAREN) 1 % GEL Apply 3 g to 3 large joints up to 3 times daily.   diltiazem (CARDIZEM CD) 120 MG 24 hr capsule Take 1 capsule (120 mg total) by mouth daily.   ELIQUIS 5 MG TABS tablet TAKE 1 TABLET BY MOUTH TWICE DAILY   fluticasone (FLONASE) 50 MCG/ACT nasal spray USE 2 SPRAYS IN EACH NOSTRIL EVERY DAY   Ginger, Zingiber  officinalis, 550 MG CAPS Take 1 capsule (550 mg total) by mouth daily.   glucose blood test strip USE TO TEST BLOOD SUGAR ONCE DAILY. E11.40   hydroxychloroquine (PLAQUENIL) 200 MG tablet TAKE 1 TABLET BY MOUTH ONCE DAILY ON MONDAY-FRIDAY   loratadine (CLARITIN) 10 MG tablet TAKE 1 TABLET BY MOUTH EVERY DAY   metoprolol tartrate (LOPRESSOR) 25 MG tablet TAKE 1 TABLET BY MOUTH TWICE DAILY   Multiple Vitamins-Minerals (CENTRUM SILVER 50+WOMEN) TABS Take 1 tablet by mouth daily at 6 (six) AM.   nitroGLYCERIN (NITROSTAT) 0.4 MG SL tablet Place 1 tablet (0.4 mg total) under the tongue every 5 (five) minutes as needed for chest pain.    Omega-3 Fatty Acids (FISH OIL) 1000 MG CAPS Take 1,000 mg by mouth daily.   omeprazole (PRILOSEC) 40 MG capsule TAKE ONE CAPSULE BY MOUTH DAILY   ONETOUCH DELICA LANCETS 20U MISC USE TO TEST ONCE DAILY   ONETOUCH VERIO test strip USE TO TEST BLOOD SUGAR ONCE DAILY   Turmeric (QC TUMERIC COMPLEX) 500 MG CAPS Take 1 capsule by mouth daily.     Allergies:   Levofloxacin, Nitrofurantoin, Penicillins, Sulfonamide derivatives, and Pantoprazole   Social History   Socioeconomic History   Marital status: Married    Spouse name: Not on file   Number of children: 3   Years of education: 7   Highest education level: Not on file  Occupational History   Not on file  Tobacco Use   Smoking status: Never   Smokeless tobacco: Never  Vaping Use   Vaping Use: Never used  Substance and Sexual Activity   Alcohol use: No   Drug use: Never   Sexual activity: Never    Birth control/protection: Rhythm  Other Topics Concern   Not on file  Social History Narrative   Lives at home with husband   Caffeine, maybe 1 a day   Social Determinants of Health   Financial Resource Strain: Low Risk    Difficulty of Paying Living Expenses: Not very hard  Food Insecurity: Not on file  Transportation Needs: Not on file  Physical Activity: Not on file  Stress: Not on file  Social Connections: Not on file     Family History:  The patient's  family history includes Diabetes in her brother, brother, father, mother, sister, and sister; Emphysema in her father; Heart failure in her mother; Hypercholesterolemia in her brother, brother, sister, sister, sister, and sister; Hypertension in her brother, brother, and sister.   ROS:   Please see the history of present illness.    ROS All other systems reviewed and are negative.   PHYSICAL EXAM:   VS:  BP (!) 150/84   Pulse 66   Ht _0  (1.753 m)   Wt 192 lb 9.6 oz (87.4 kg)   SpO2 98%   BMI 28.44 kg/m   Physical Exam  GEN: Well nourished, well developed,  in no acute distress  Neck: no JVD, carotid bruits, or masses Cardiac:RRR; no murmurs, rubs, or gallops  Respiratory:  clear to auscultation bilaterally, normal work of breathing GI: soft, nontender, nondistended, + BS Ext: without cyanosis, clubbing, or edema, Good distal pulses bilaterally Neuro:  Alert and Oriented x 3 Psych: euthymic mood, full affect  Wt Readings from Last 3 Encounters:  07/15/20 192 lb 9.6 oz (87.4 kg)  06/28/20 188 lb (85.3 kg)  06/05/20 193 lb (87.5 kg)      Studies/Labs Reviewed:   EKG:  EKG is not ordered today.  Recent Labs: 07/25/2019: Magnesium 2.2 01/05/2020: B Natriuretic Peptide 107.0; TSH 1.515 04/23/2020: Hemoglobin 12.5; Platelets 224 06/28/2020: ALT 15; BUN 12; Creatinine, Ser 0.96; Potassium 4.0; Sodium 140   Lipid Panel    Component Value Date/Time   CHOL 132 06/28/2020 1248   TRIG 44.0 06/28/2020 1248   HDL 46.70 06/28/2020 1248   CHOLHDL 3 06/28/2020 1248   VLDL 8.8 06/28/2020 1248   LDLCALC 76 06/28/2020 1248    Additional studies/ records that were reviewed today include:  ZIO monitor 05/2020 Patch Wear Time:  13 days and 16 hours (2022-05-18T11:46:34-0400 to 2022-06-01T04:15:38-0400)   Patient had a min HR of 41 bpm, max HR of 174 bpm, and avg HR of 55 bpm. Predominant underlying rhythm was Sinus Rhythm. 27 Supraventricular Tachycardia runs occurred, the run with the fastest interval lasting 5 beats with a max rate of 174 bpm, the longest lasting 12 beats with an avg rate of 113 bpm. Isolated SVEs were rare (<1.0%), SVE Couplets were rare (<1.0%), and SVE Triplets were rare (<1.0%). Isolated VEs were rare (<1.0%), VE Couplets were rare (<1.0%), and no VE Triplets were present. Ventricular Bigeminy was present.                    Risk Assessment/Calculations:    CHA2DS2-VASc Score = 4  This indicates a 4.8% annual risk of stroke. The patient's score is based upon: CHF History: No HTN History: Yes Diabetes  History: Yes Stroke History: No Vascular Disease History: No        ASSESSMENT:    1. Paroxysmal atrial fibrillation (HCC)   2. Chest pain of uncertain etiology   3. Essential hypertension   4. Hyperlipidemia, unspecified hyperlipidemia type      PLAN:  In order of problems listed above:  PAF on Eliquis and diltiazem reduced to 120 mg a day because of bradycardia.  Recent ZIO monitor showed minimum heart rate of 41 max 174 with 27 SVT runs.  Palpitations have actually improved.  We will continue low-dose diltiazem.  Keep follow-up with Dr. Harl Bowie in August.  History of chest pain cardiac cath 11/2019 showed normal left system but the RCA was unable to be engaged.  Medical therapy recommended unless refractory symptoms.  Complains of only chest soreness at times.  Has been doing some lifting of laundry that she thinks may have exacerbated it.  Hypertension blood pressure up today but has been stable at home.  She will continue to monitor after she takes her medications.  2 g sodium diet discussed.  Hyperlipidemia LDL 76 on atorvastatin   Shared Decision Making/Informed Consent        Medication Adjustments/Labs and Tests Ordered: Current medicines are reviewed at length with the patient today.  Concerns regarding medicines are outlined above.  Medication changes, Labs and Tests ordered today are listed in the Patient Instructions below. Patient Instructions  Medication Instructions:  Your physician recommends that you continue on your current medications as directed. Please refer to the Current Medication list given to you today.  *If you need a refill on your cardiac medications before your next appointment, please call your pharmacy*   Lab Work: None If you have labs (blood work) drawn today and your tests are completely normal, you will receive your results only by: [facility] (if you have MyChart) OR A paper copy in the mail If you have any lab test that is  abnormal or we need to change your treatment, we will call you to  review the results.   Testing/Procedures: None   Follow-Up: At [facility], you and your health needs are our priority.  As part of our continuing mission to provide you with exceptional heart care, we have created designated Provider Care Teams.  These Care Teams include your primary Cardiologist (physician) and Advanced Practice Providers (APPs -  Physician Assistants and Nurse Practitioners) who all work together to provide you with the care you need, when you need it.  We recommend signing up for the patient portal called "MyChart".  Sign up information is provided on this After Visit Summary.  MyChart is used to connect with patients for Virtual Visits (Telemedicine).  Patients are able to view lab/test results, encounter notes, upcoming appointments, etc.  Non-urgent messages can be sent to your provider as well.   To learn more about what you can do with MyChart, go to [email or web address].    Your next appointment:   Keep follow up appointment   Other Instructions Two Gram Sodium Diet 2000 mg  What is Sodium? Sodium is a mineral found naturally in many foods. The most significant source of sodium in the diet is table salt, which is about 40% sodium.  Processed, convenience, and preserved foods also contain a large amount of sodium.  The body needs only 500 mg of sodium daily to function,  A normal diet provides more than enough sodium even if you do not use salt.  Why Limit Sodium? A build up of sodium in the body can cause thirst, increased blood pressure, shortness of breath, and water retention.  Decreasing sodium in the diet can reduce edema and risk of heart attack or stroke associated with high blood pressure.  Keep in mind that there are many other factors involved in these health problems.  Heredity, obesity, lack of exercise, cigarette smoking, stress and what you eat all play a role.  General  Guidelines: Do not add salt at the table or in cooking.  One teaspoon of salt contains over 2 grams of sodium. Read food labels Avoid processed and convenience foods Ask your dietitian before eating any foods not dicussed in the menu planning guidelines Consult your physician if you wish to use a salt substitute or a sodium containing medication such as antacids.  Limit milk and milk products to 16 oz (2 cups) per day.  Shopping Hints: READ LABELS!! "Dietetic" does not necessarily mean low sodium. Salt and other sodium ingredients are often added to foods during processing.    Menu Planning Guidelines Food Group Choose More Often Avoid  Beverages (see also the milk group All fruit juices, low-sodium, salt-free vegetables juices, low-sodium carbonated beverages Regular vegetable or tomato juices, commercially softened water used for drinking or cooking  Breads and Cereals Enriched white, wheat, rye and pumpernickel bread, hard rolls and dinner rolls; muffins, cornbread and waffles; most dry cereals, cooked cereal without added salt; unsalted crackers and breadsticks; low sodium or homemade bread crumbs Bread, rolls and crackers with salted tops; quick breads; instant hot cereals; pancakes; commercial bread stuffing; self-rising flower and biscuit mixes; regular bread crumbs or cracker crumbs  Desserts and Sweets Desserts and sweets mad with mild should be within allowance Instant pudding mixes and cake mixes  Fats Butter or margarine; vegetable oils; unsalted salad dressings, regular salad dressings limited to 1 Tbs; light, sour and heavy cream Regular salad dressings containing bacon fat, bacon bits, and salt pork; snack dips made with instant soup mixes or processed cheese; salted nuts  Fruits Most  fresh, frozen and canned fruits Fruits processed with salt or sodium-containing ingredient (some dried fruits are processed with sodium sulfites        Vegetables Fresh, frozen vegetables and  low- sodium canned vegetables Regular canned vegetables, sauerkraut, pickled vegetables, and others prepared in brine; frozen vegetables in sauces; vegetables seasoned with ham, bacon or salt pork  Condiments, Sauces, Miscellaneous  Salt substitute with physician's approval; pepper, herbs, spices; vinegar, lemon or lime juice; hot pepper sauce; garlic powder, onion powder, low sodium soy sauce (1 Tbs.); low sodium condiments (ketchup, chili sauce, mustard) in limited amounts (1 tsp.) fresh ground horseradish; unsalted tortilla chips, pretzels, potato chips, popcorn, salsa (1/4 cup) Any seasoning made with salt including garlic salt, celery salt, onion salt, and seasoned salt; sea salt, rock salt, kosher salt; meat tenderizers; monosodium glutamate; mustard, regular soy sauce, barbecue, sauce, chili sauce, teriyaki sauce, steak sauce, Worcestershire sauce, and most flavored vinegars; canned gravy and mixes; regular condiments; salted snack foods, olives, picles, relish, horseradish sauce, catsup   Food preparation: Try these seasonings Meats:    Pork Sage, onion Serve with applesauce  Chicken Poultry seasoning, thyme, parsley Serve with cranberry sauce  Lamb Curry powder, rosemary, garlic, thyme Serve with mint sauce or jelly  Veal Marjoram, basil Serve with current jelly, cranberry sauce  Beef Pepper, bay leaf Serve with dry mustard, unsalted chive butter  Fish [place] leaf, dill Serve with unsalted lemon butter, unsalted parsley butter  Vegetables:    Asparagus Lemon juice   Broccoli Lemon juice   Carrots Mustard dressing parsley, mint, nutmeg, glazed with unsalted butter and sugar   Green beans Marjoram, lemon juice, nutmeg,dill seed   Tomatoes Basil, marjoram, onion   Spice /blend for [employer]" 4 tsp ground thyme 1 tsp ground sage 3 tsp ground rosemary 4 tsp ground marjoram   Test your knowledge A product that says "Salt Free" may still contain sodium. True or False Garlic Powder and Hot  Pepper Sauce an be used as alternative seasonings.True or False Processed foods have more sodium than fresh foods.  True or False Canned Vegetables have less sodium than froze True or False   WAYS TO DECREASE YOUR SODIUM INTAKE Avoid the use of added salt in cooking and at the table.  Table salt (and other prepared seasonings which contain salt) is probably one of the greatest sources of sodium in the diet.  Unsalted foods can gain flavor from the sweet, sour, and butter taste sensations of herbs and spices.  Instead of using salt for seasoning, try the following seasonings with the foods listed.  Remember: how you use them to enhance natural food flavors is limited only by your creativity... Allspice-Meat, fish, eggs, fruit, peas, red and yellow vegetables Almond Extract-Fruit baked goods Anise Seed-Sweet breads, fruit, carrots, beets, cottage cheese, cookies (tastes like licorice) Basil-Meat, fish, eggs, vegetables, rice, vegetables salads, soups, sauces Bay Leaf-Meat, fish, stews, poultry Burnet-Salad, vegetables (cucumber-like flavor) Caraway Seed-Bread, cookies, cottage cheese, meat, vegetables, cheese, rice Cardamon-Baked goods, fruit, soups Celery Powder or seed-Salads, salad dressings, sauces, meatloaf, soup, bread.Do not use  celery salt Chervil-Meats, salads, fish, eggs, vegetables, cottage cheese (parsley-like flavor) Chili Power-Meatloaf, chicken cheese, corn, eggplant, egg dishes Chives-Salads cottage cheese, egg dishes, soups, vegetables, sauces Cilantro-Salsa, casseroles Cinnamon-Baked goods, fruit, pork, lamb, chicken, carrots Cloves-Fruit, baked goods, fish, pot roast, green beans, beets, carrots Coriander-Pastry, cookies, meat, salads, cheese (lemon-orange flavor) Cumin-Meatloaf, fish,cheese, eggs, cabbage,fruit pie (caraway flavor) [employer], fruit, eggs, fish, poultry, cottage cheese, vegetables Kizzie Furnish  Seed-Meat, cottage cheese, poultry, vegetables, fish, salads,  bread Fennel Seed-Bread, cookies, apples, pork, eggs, fish, beets, cabbage, cheese, Licorice-like flavor Garlic-(buds or powder) Salads, meat, poultry, fish, bread, butter, vegetables, potatoes.Do not  use garlic salt Ginger-Fruit, vegetables, baked goods, meat, fish, poultry Horseradish Root-Meet, vegetables, butter Lemon Juice or Extract-Vegetables, fruit, tea, baked goods, fish salads Mace-Baked goods fruit, vegetables, fish, poultry (taste like nutmeg) Maple Extract-Syrups Marjoram-Meat, chicken, fish, vegetables, breads, green salads (taste like Sage) Mint-Tea, lamb, sherbet, vegetables, desserts, carrots, cabbage Mustard, Dry or Seed-Cheese, eggs, meats, vegetables, poultry Nutmeg-Baked goods, fruit, chicken, eggs, vegetables, desserts Onion Powder-Meat, fish, poultry, vegetables, cheese, eggs, bread, rice salads (Do not use   Onion salt) Orange Extract-Desserts, baked goods Oregano-Pasta, eggs, cheese, onions, pork, lamb, fish, chicken, vegetables, green salads Paprika-Meat, fish, poultry, eggs, cheese, vegetables Parsley Flakes-Butter, vegetables, meat fish, poultry, eggs, bread, salads (certain forms may   Contain sodium Pepper-Meat fish, poultry, vegetables, eggs Peppermint Extract-Desserts, baked goods Poppy Seed-Eggs, bread, cheese, fruit dressings, baked goods, noodles, vegetables, cottage  [employer], poultry, meat, fish, cauliflower, turnips,eggs bread Saffron-Rice, bread, veal, chicken, fish, eggs Sage-Meat, fish, poultry, onions, eggplant, tomateos, pork, stews Savory-Eggs, salads, poultry, meat, rice, vegetables, soups, pork Tarragon-Meat, poultry, fish, eggs, butter, vegetables (licorice-like flavor)  Thyme-Meat, poultry, fish, eggs, vegetables, (clover-like flavor), sauces, soups Tumeric-Salads, butter, eggs, fish, rice, vegetables (saffron-like flavor) Vanilla Extract-Baked goods, candy Vinegar-Salads, vegetables, meat  marinades Walnut Extract-baked goods, candy   2. Choose your Foods Wisely   The following is a list of foods to avoid which are high in sodium:  Meats-Avoid all smoked, canned, salt cured, dried and kosher meat and fish as well as Anchovies   [employer] meats:Bologna, Liverwurst, Pastrami Canned meat or fish  Marinated herring Caviar    Pepperoni Corned Beef   Pizza Dried chipped beef  Salami Frozen breaded fish or meat Salt pork Frankfurters or hot dogs  Sardines Gefilte fish   Sausage Ham (boiled ham, Proscuitto Smoked butt    spiced ham)   Spam      TV Dinners Vegetables Canned vegetables (Regular) Relish Canned mushrooms  Sauerkraut Olives    Tomato juice Pickles  Bakery and Dessert Products Canned puddings  Cream pies Cheesecake   Decorated cakes Cookies  Beverages/Juices Tomato juice, regular  Gatorade   V-8 vegetable juice, regular  Breads and Cereals Biscuit mixes   Salted potato chips, corn chips, pretzels Bread stuffing mixes  Salted crackers and rolls Pancake and waffle mixes Self-rising flour  Seasonings Accent    Meat sauces Barbecue sauce  Meat tenderizer Catsup    Monosodium glutamate (MSG) Celery salt   Onion salt Chili sauce   Prepared mustard Garlic salt   Salt, seasoned salt, sea salt Gravy mixes   Soy sauce Horseradish   Steak sauce Ketchup   Tartar sauce Lite salt    Teriyaki sauce Marinade mixes   Worcestershire sauce  Others Baking powder   Cocoa and cocoa mixes Baking soda   Commercial casserole mixes Candy-caramels, chocolate  Dehydrated soups    Bars, fudge,nougats  Instant rice and pasta mixes Canned broth or soup  Maraschino cherries Cheese, aged and processed cheese and cheese spreads  Learning Assessment Quiz  Indicated T (for True) or F (for False) for each of the following statements:  _____ Fresh fruits and vegetables and unprocessed grains are generally low in sodium _____ Water may contain a considerable  amount of sodium, depending on the source _____ You can  always tell if a food is high in sodium by tasting it _____ Certain laxatives my be high in sodium and should be avoided unless prescribed   by a physician or pharmacist _____ Salt substitutes may be used freely by anyone on a sodium restricted diet _____ Sodium is present in table salt, food additives and as a natural component of   most foods _____ Table salt is approximately 90% sodium _____ Limiting sodium intake may help prevent excess fluid accumulation in the body _____ On a sodium-restricted diet, seasonings such as bouillon soy sauce, and    cooking wine should be used in place of table salt _____ On an ingredient list, a product which lists monosodium glutamate as the first   ingredient is an appropriate food to include on a low sodium diet  Circle the best answer(s) to the following statements (Hint: there may be more than one correct answer)  11. On a low-sodium diet, some acceptable snack items are:    A. Olives  F. Bean dip   K. Grapefruit juice    B. Salted Pretzels G. Commercial Popcorn   L. Canned peaches    C. Carrot Sticks  H. Bouillon   M. Unsalted nuts   D. [place] fries  I. Peanut butter crackers N. Salami   E. Sweet pickles J. Tomato Juice   O. Pizza  12.  Seasonings that may be used freely on a reduced - sodium diet include   A. Lemon wedges F.Monosodium glutamate K. Celery seed    B.Soysauce   G. Pepper   L. Mustard powder   C. Sea salt  H. Cooking wine  M. Onion flakes   D. Vinegar  E. Prepared horseradish N. Salsa   E. Sage   J. Worcestershire sauce  O. [address]    Sumner Boast, PA-C  07/15/2020 11:48 AM    [facility] [place], [place], [place]  [address] Phone: [phone]; Fax: [phone]

## 2020-07-09 NOTE — Telephone Encounter (Signed)
Please clarify if she has an orthopedist? We can refer her to [facility] if she would like to be evaluated.

## 2020-07-09 NOTE — Progress Notes (Signed)
Chronic Care Management Pharmacy Assistant   Name: Angela Simmons MRN: [id number] DOB: April 22, 1945  Reason for Encounter: Disease State - Hypertension   Recent office visits:  06/28/20 [facility] Burow (PCP) - Hypertension. No med changes. F/u 6 mos.  Recent consult visits:  06/05/20 [facility] (Cardiology) - D/c Benazepril & Triamsinolone. Long term monitor 3-14 days.  04/23/20 [facility] (Rheumatology) - Sjogren's syndrome. D/c Prednisone. D/c 5 mos.  04/18/20 [facility] (Podiatry) - Corns & callosities.  Hospital visits:  None in previous 6 months  Medications: Outpatient Encounter Medications as of 07/09/2020  Medication Sig   atorvastatin (LIPITOR) 10 MG tablet TAKE 1 TABLET BY MOUTH DAILY   benazepril (LOTENSIN) 40 MG tablet Take 40 mg by mouth daily.   Blood Glucose Monitoring Suppl (ONETOUCH VERIO) w/Device KIT USE TO TEST BLOOD SUGAR ONCE DAILY AS DIRECTED   diclofenac sodium (VOLTAREN) 1 % GEL Apply 3 g to 3 large joints up to 3 times daily.   diltiazem (CARDIZEM CD) 120 MG 24 hr capsule Take 1 capsule (120 mg total) by mouth daily.   ELIQUIS 5 MG TABS tablet TAKE 1 TABLET BY MOUTH TWICE DAILY   fluticasone (FLONASE) 50 MCG/ACT nasal spray USE 2 SPRAYS IN EACH NOSTRIL EVERY DAY   Ginger, Zingiber officinalis, 550 MG CAPS Take 1 capsule (550 mg total) by mouth daily.   glucose blood test strip USE TO TEST BLOOD SUGAR ONCE DAILY. E11.40   hydroxychloroquine (PLAQUENIL) 200 MG tablet TAKE 1 TABLET BY MOUTH ONCE DAILY ON MONDAY-FRIDAY   loratadine (CLARITIN) 10 MG tablet TAKE 1 TABLET BY MOUTH EVERY DAY   metoprolol tartrate (LOPRESSOR) 25 MG tablet TAKE 1 TABLET BY MOUTH TWICE DAILY   Multiple Vitamins-Minerals (CENTRUM SILVER 50+WOMEN) TABS Take 1 tablet by mouth daily at 6 (six) AM.   nitroGLYCERIN (NITROSTAT) 0.4 MG SL tablet Place 1 tablet (0.4 mg total) under the tongue every 5 (five) minutes as needed for chest pain.   Omega-3 Fatty Acids (FISH OIL) 1000 MG CAPS Take 1,000 mg by mouth daily.    omeprazole (PRILOSEC) 40 MG capsule TAKE ONE CAPSULE BY MOUTH DAILY   ONETOUCH DELICA LANCETS 18Z MISC USE TO TEST ONCE DAILY   ONETOUCH VERIO test strip USE TO TEST BLOOD SUGAR ONCE DAILY   Turmeric (QC TUMERIC COMPLEX) 500 MG CAPS Take 1 capsule by mouth daily.   No facility-administered encounter medications on file as of 07/09/2020.    Reviewed chart prior to disease state call. Spoke with patient regarding BP  Recent Office Vitals: BP Readings from Last 3 Encounters:  06/28/20 (!) 146/72  06/05/20 (!) 160/90  04/23/20 (!) 180/83   Pulse Readings from Last 3 Encounters:  06/28/20 62  06/05/20 68  04/23/20 60    Wt Readings from Last 3 Encounters:  06/28/20 188 lb (85.3 kg)  06/05/20 193 lb (87.5 kg)  04/23/20 195 lb 6.4 oz (88.6 kg)     Kidney Function Lab Results  Component Value Date/Time   CREATININE 0.96 06/28/2020 12:48 PM   CREATININE 0.92 04/23/2020 11:44 AM   CREATININE 0.90 01/05/2020 03:23 PM   CREATININE 0.92 11/07/2019 10:30 AM   GFR 58.46 (L) 06/28/2020 12:48 PM   GFRNONAA 62 04/23/2020 11:44 AM   GFRAA 72 04/23/2020 11:44 AM    BMP Latest Ref Rng & Units 06/28/2020 04/23/2020 01/05/2020  Glucose 70 - 99 mg/dL 80 125(H) 91  BUN 6 - 23 mg/dL 12 11 11   Creatinine 0.40 - 1.20 mg/dL 0.96 0.92 0.90  BUN/Creat Ratio 6 - 22 (calc) - NOT APPLICABLE -  Sodium 991 - 145 mEq/L 140 139 137  Potassium 3.5 - 5.1 mEq/L 4.0 4.2 3.6  Chloride 96 - 112 mEq/L 103 104 106  CO2 19 - 32 mEq/L 28 29 23   Calcium 8.4 - 10.5 mg/dL 9.4 9.4 9.0    Current antihypertensive regimen:  Amlodipine 5 mg daily Benazepril 40 mg daily Carvedilol 6.25 mg twice a day  How often are you checking your Blood Pressure? daily  Current home BP readings:   6/24 - 138/81  6/26  -132/75  6/27 - 150/86  What decent interventions/DTPs have been made by any provider to improve Blood Pressure control since last CPP Visit:   None noted  Any recent hospitalizations or ED visits since last  visit with CPP? No  What diet changes have been made to improve Blood Pressure Control?  Patient states she watches her salt intake.  What exercise is being done to improve your Blood Pressure Control?  Patient states she hasn't been able to exercise because she has been taking care of her husband who has fallen and needs total care.  Adherence Review: Is the patient currently on ACE/ARB medication? Yes Does the patient have >5 day gap between last estimated fill dates? No  Star Rating Drugs: Atorvastatin - last fill 06/25/20 30D Benazepril - last fill 04/27/20 90D  Patient is due for a follow up visit with CPP, patient is also interested in learning more about [facility].  Orinda Kenner, [place] Clinical Pharmacists Assistant [phone]  Time Spent:55

## 2020-07-10 NOTE — Telephone Encounter (Signed)
Patient states she does have and orthopedist. Patient advised to contact her Orthopedist and schedule an appointment.

## 2020-07-13 ENCOUNTER — Other Ambulatory Visit: Payer: Self-pay | Admitting: Physician Assistant

## 2020-07-15 ENCOUNTER — Other Ambulatory Visit: Payer: Self-pay | Admitting: Internal Medicine

## 2020-07-15 ENCOUNTER — Other Ambulatory Visit: Payer: Self-pay

## 2020-07-15 ENCOUNTER — Encounter: Payer: Self-pay | Admitting: Physician Assistant

## 2020-07-15 ENCOUNTER — Ambulatory Visit: Payer: PPO | Admitting: Physician Assistant

## 2020-07-15 VITALS — BP 150/84 | HR 66 | Ht 69.0 in | Wt 192.6 lb

## 2020-07-15 DIAGNOSIS — I10 Essential (primary) hypertension: Secondary | ICD-10-CM | POA: Diagnosis not present

## 2020-07-15 DIAGNOSIS — R079 Chest pain, unspecified: Secondary | ICD-10-CM

## 2020-07-15 DIAGNOSIS — I480 Paroxysmal atrial fibrillation: Secondary | ICD-10-CM | POA: Diagnosis not present

## 2020-07-15 DIAGNOSIS — E785 Hyperlipidemia, unspecified: Secondary | ICD-10-CM | POA: Diagnosis not present

## 2020-07-15 NOTE — Telephone Encounter (Signed)
Last Visit: 04/23/2020 Next Visit: 09/25/2020 Labs: 04/23/2020 White cell count is low and stable, glucose is elevated probably not a fasting sample.  Sed rate is mildly elevated and stable.  Complements are normal. Double-stranded DNA is negative.  Labs do not indicate active disease.   Eye exam: 01/10/2020   Current Dose per office note on 04/23/2020: Plaquenil 200 mg 1 tablet by mouth daily Monday to Friday LR:[id number] syndrome with keratoconjunctivitis sicca  Last Fill: 02/27/2020  Okay to refill Plaquenil?

## 2020-07-15 NOTE — Patient Instructions (Signed)
Medication Instructions:  Your physician recommends that you continue on your current medications as directed. Please refer to the Current Medication list given to you today.  *If you need a refill on your cardiac medications before your next appointment, please call your pharmacy*   Lab Work: None If you have labs (blood work) drawn today and your tests are completely normal, you will receive your results only by: [place] (if you have MyChart) OR A paper copy in the mail If you have any lab test that is abnormal or we need to change your treatment, we will call you to review the results.   Testing/Procedures: None   Follow-Up: At [facility], you and your health needs are our priority.  As part of our continuing mission to provide you with exceptional heart care, we have created designated Provider Care Teams.  These Care Teams include your primary Cardiologist (physician) and Advanced Practice Providers (APPs -  Physician Assistants and Nurse Practitioners) who all work together to provide you with the care you need, when you need it.  We recommend signing up for the patient portal called "MyChart".  Sign up information is provided on this After Visit Summary.  MyChart is used to connect with patients for Virtual Visits (Telemedicine).  Patients are able to view lab/test results, encounter notes, upcoming appointments, etc.  Non-urgent messages can be sent to your provider as well.   To learn more about what you can do with MyChart, go to [email or web address].    Your next appointment:   Keep follow up appointment   Other Instructions Two Gram Sodium Diet 2000 mg  What is Sodium? Sodium is a mineral found naturally in many foods. The most significant source of sodium in the diet is table salt, which is about 40% sodium.  Processed, convenience, and preserved foods also contain a large amount of sodium.  The body needs only 500 mg of sodium daily to function,  A normal  diet provides more than enough sodium even if you do not use salt.  Why Limit Sodium? A build up of sodium in the body can cause thirst, increased blood pressure, shortness of breath, and water retention.  Decreasing sodium in the diet can reduce edema and risk of heart attack or stroke associated with high blood pressure.  Keep in mind that there are many other factors involved in these health problems.  Heredity, obesity, lack of exercise, cigarette smoking, stress and what you eat all play a role.  General Guidelines: Do not add salt at the table or in cooking.  One teaspoon of salt contains over 2 grams of sodium. Read food labels Avoid processed and convenience foods Ask your dietitian before eating any foods not dicussed in the menu planning guidelines Consult your physician if you wish to use a salt substitute or a sodium containing medication such as antacids.  Limit milk and milk products to 16 oz (2 cups) per day.  Shopping Hints: READ LABELS!! "Dietetic" does not necessarily mean low sodium. Salt and other sodium ingredients are often added to foods during processing.    Menu Planning Guidelines Food Group Choose More Often Avoid  Beverages (see also the milk group All fruit juices, low-sodium, salt-free vegetables juices, low-sodium carbonated beverages Regular vegetable or tomato juices, commercially softened water used for drinking or cooking  Breads and Cereals Enriched white, wheat, rye and pumpernickel bread, hard rolls and dinner rolls; muffins, cornbread and waffles; most dry cereals, cooked cereal without added salt; unsalted  crackers and breadsticks; low sodium or homemade bread crumbs Bread, rolls and crackers with salted tops; quick breads; instant hot cereals; pancakes; commercial bread stuffing; self-rising flower and biscuit mixes; regular bread crumbs or cracker crumbs  Desserts and Sweets Desserts and sweets mad with mild should be within allowance Instant pudding  mixes and cake mixes  Fats Butter or margarine; vegetable oils; unsalted salad dressings, regular salad dressings limited to 1 Tbs; light, sour and heavy cream Regular salad dressings containing bacon fat, bacon bits, and salt pork; snack dips made with instant soup mixes or processed cheese; salted nuts  Fruits Most fresh, frozen and canned fruits Fruits processed with salt or sodium-containing ingredient (some dried fruits are processed with sodium sulfites        Vegetables Fresh, frozen vegetables and low- sodium canned vegetables Regular canned vegetables, sauerkraut, pickled vegetables, and others prepared in brine; frozen vegetables in sauces; vegetables seasoned with ham, bacon or salt pork  Condiments, Sauces, Miscellaneous  Salt substitute with physician's approval; pepper, herbs, spices; vinegar, lemon or lime juice; hot pepper sauce; garlic powder, onion powder, low sodium soy sauce (1 Tbs.); low sodium condiments (ketchup, chili sauce, mustard) in limited amounts (1 tsp.) fresh ground horseradish; unsalted tortilla chips, pretzels, potato chips, popcorn, salsa (1/4 cup) Any seasoning made with salt including garlic salt, celery salt, onion salt, and seasoned salt; sea salt, rock salt, kosher salt; meat tenderizers; monosodium glutamate; mustard, regular soy sauce, barbecue, sauce, chili sauce, teriyaki sauce, steak sauce, Worcestershire sauce, and most flavored vinegars; canned gravy and mixes; regular condiments; salted snack foods, olives, picles, relish, horseradish sauce, catsup   Food preparation: Try these seasonings Meats:    Pork Sage, onion Serve with applesauce  Chicken Poultry seasoning, thyme, parsley Serve with cranberry sauce  Lamb Curry powder, rosemary, garlic, thyme Serve with mint sauce or jelly  Veal Marjoram, basil Serve with current jelly, cranberry sauce  Beef Pepper, bay leaf Serve with dry mustard, unsalted chive butter  Fish [place] leaf, dill Serve with unsalted  lemon butter, unsalted parsley butter  Vegetables:    Asparagus Lemon juice   Broccoli Lemon juice   Carrots Mustard dressing parsley, mint, nutmeg, glazed with unsalted butter and sugar   Green beans Marjoram, lemon juice, nutmeg,dill seed   Tomatoes Basil, marjoram, onion   Spice /blend for [employer]" 4 tsp ground thyme 1 tsp ground sage 3 tsp ground rosemary 4 tsp ground marjoram   Test your knowledge A product that says "Salt Free" may still contain sodium. True or False Garlic Powder and Hot Pepper Sauce an be used as alternative seasonings.True or False Processed foods have more sodium than fresh foods.  True or False Canned Vegetables have less sodium than froze True or False   WAYS TO DECREASE YOUR SODIUM INTAKE Avoid the use of added salt in cooking and at the table.  Table salt (and other prepared seasonings which contain salt) is probably one of the greatest sources of sodium in the diet.  Unsalted foods can gain flavor from the sweet, sour, and butter taste sensations of herbs and spices.  Instead of using salt for seasoning, try the following seasonings with the foods listed.  Remember: how you use them to enhance natural food flavors is limited only by your creativity... Allspice-Meat, fish, eggs, fruit, peas, red and yellow vegetables Almond Extract-Fruit baked goods Anise Seed-Sweet breads, fruit, carrots, beets, cottage cheese, cookies (tastes like licorice) Basil-Meat, fish, eggs, vegetables, rice, vegetables salads, soups, sauces Bay  Leaf-Meat, fish, stews, poultry Burnet-Salad, vegetables (cucumber-like flavor) Caraway Seed-Bread, cookies, cottage cheese, meat, vegetables, cheese, rice Cardamon-Baked goods, fruit, soups Celery Powder or seed-Salads, salad dressings, sauces, meatloaf, soup, bread.Do not use  celery salt Chervil-Meats, salads, fish, eggs, vegetables, cottage cheese (parsley-like flavor) Chili Power-Meatloaf, chicken cheese, corn, eggplant, egg  dishes Chives-Salads cottage cheese, egg dishes, soups, vegetables, sauces Cilantro-Salsa, casseroles Cinnamon-Baked goods, fruit, pork, lamb, chicken, carrots Cloves-Fruit, baked goods, fish, pot roast, green beans, beets, carrots Coriander-Pastry, cookies, meat, salads, cheese (lemon-orange flavor) Cumin-Meatloaf, fish,cheese, eggs, cabbage,fruit pie (caraway flavor) [employer], fruit, eggs, fish, poultry, cottage cheese, vegetables Dill Seed-Meat, cottage cheese, poultry, vegetables, fish, salads, bread Fennel Seed-Bread, cookies, apples, pork, eggs, fish, beets, cabbage, cheese, Licorice-like flavor Garlic-(buds or powder) Salads, meat, poultry, fish, bread, butter, vegetables, potatoes.Do not  use garlic salt Ginger-Fruit, vegetables, baked goods, meat, fish, poultry Horseradish Root-Meet, vegetables, butter Lemon Juice or Extract-Vegetables, fruit, tea, baked goods, fish salads Mace-Baked goods fruit, vegetables, fish, poultry (taste like nutmeg) Maple Extract-Syrups Marjoram-Meat, chicken, fish, vegetables, breads, green salads (taste like Sage) Mint-Tea, lamb, sherbet, vegetables, desserts, carrots, cabbage Mustard, Dry or Seed-Cheese, eggs, meats, vegetables, poultry Nutmeg-Baked goods, fruit, chicken, eggs, vegetables, desserts Onion Powder-Meat, fish, poultry, vegetables, cheese, eggs, bread, rice salads (Do not use   Onion salt) Orange Extract-Desserts, baked goods Oregano-Pasta, eggs, cheese, onions, pork, lamb, fish, chicken, vegetables, green salads Paprika-Meat, fish, poultry, eggs, cheese, vegetables Parsley Flakes-Butter, vegetables, meat fish, poultry, eggs, bread, salads (certain forms may   Contain sodium Pepper-Meat fish, poultry, vegetables, eggs Peppermint Extract-Desserts, baked goods Poppy Seed-Eggs, bread, cheese, fruit dressings, baked goods, noodles, vegetables, cottage  [employer], poultry, meat, fish,  cauliflower, turnips,eggs bread Saffron-Rice, bread, veal, chicken, fish, eggs Sage-Meat, fish, poultry, onions, eggplant, tomateos, pork, stews Savory-Eggs, salads, poultry, meat, rice, vegetables, soups, pork Tarragon-Meat, poultry, fish, eggs, butter, vegetables (licorice-like flavor)  Thyme-Meat, poultry, fish, eggs, vegetables, (clover-like flavor), sauces, soups Tumeric-Salads, butter, eggs, fish, rice, vegetables (saffron-like flavor) Vanilla Extract-Baked goods, candy Vinegar-Salads, vegetables, meat marinades Walnut Extract-baked goods, candy   2. Choose your Foods Wisely   The following is a list of foods to avoid which are high in sodium:  Meats-Avoid all smoked, canned, salt cured, dried and kosher meat and fish as well as Anchovies   [employer] meats:Bologna, Liverwurst, Pastrami Canned meat or fish  Marinated herring Caviar    Pepperoni Corned Beef   Pizza Dried chipped beef  Salami Frozen breaded fish or meat Salt pork Frankfurters or hot dogs  Sardines Gefilte fish   Sausage Ham (boiled ham, Proscuitto Smoked butt    spiced ham)   Spam      TV Dinners Vegetables Canned vegetables (Regular) Relish Canned mushrooms  Sauerkraut Olives    Tomato juice Pickles  Bakery and Dessert Products Canned puddings  Cream pies Cheesecake   Decorated cakes Cookies  Beverages/Juices Tomato juice, regular  Gatorade   V-8 vegetable juice, regular  Breads and Cereals Biscuit mixes   Salted potato chips, corn chips, pretzels Bread stuffing mixes  Salted crackers and rolls Pancake and waffle mixes Self-rising flour  Seasonings Accent    Meat sauces Barbecue sauce  Meat tenderizer Catsup    Monosodium glutamate (MSG) Celery salt   Onion salt Chili sauce   Prepared mustard Garlic salt   Salt, seasoned salt, sea salt Gravy mixes   Soy sauce Horseradish   Steak sauce Ketchup   Tartar sauce Lite salt    Teriyaki sauce  Marinade mixes   Worcestershire  sauce  Others Baking powder   Cocoa and cocoa mixes Baking soda   Commercial casserole mixes Candy-caramels, chocolate  Dehydrated soups    Bars, fudge,nougats  Instant rice and pasta mixes Canned broth or soup  Maraschino cherries Cheese, aged and processed cheese and cheese spreads  Learning Assessment Quiz  Indicated T (for True) or F (for False) for each of the following statements:  _____ Fresh fruits and vegetables and unprocessed grains are generally low in sodium _____ Water may contain a considerable amount of sodium, depending on the source _____ You can always tell if a food is high in sodium by tasting it _____ Certain laxatives my be high in sodium and should be avoided unless prescribed   by a physician or pharmacist _____ Salt substitutes may be used freely by anyone on a sodium restricted diet _____ Sodium is present in table salt, food additives and as a natural component of   most foods _____ Table salt is approximately 90% sodium _____ Limiting sodium intake may help prevent excess fluid accumulation in the body _____ On a sodium-restricted diet, seasonings such as bouillon soy sauce, and    cooking wine should be used in place of table salt _____ On an ingredient list, a product which lists monosodium glutamate as the first   ingredient is an appropriate food to include on a low sodium diet  Circle the best answer(s) to the following statements (Hint: there may be more than one correct answer)  11. On a low-sodium diet, some acceptable snack items are:    A. Olives  F. Bean dip   K. Grapefruit juice    B. Salted Pretzels G. Commercial Popcorn   L. Canned peaches    C. Carrot Sticks  H. Bouillon   M. Unsalted nuts   D. [place] fries  I. Peanut butter crackers N. Salami   E. Sweet pickles J. Tomato Juice   O. Pizza  12.  Seasonings that may be used freely on a reduced - sodium diet include   A. Lemon wedges F.Monosodium glutamate K. Celery  seed    B.Soysauce   G. Pepper   L. Mustard powder   C. Sea salt  H. Cooking wine  M. Onion flakes   D. Vinegar  E. Prepared horseradish N. Salsa   E. Sage   J. Worcestershire sauce  O. Chutney

## 2020-07-16 ENCOUNTER — Other Ambulatory Visit: Payer: Self-pay | Admitting: Internal Medicine

## 2020-07-19 ENCOUNTER — Telehealth: Payer: Self-pay | Admitting: Pharmacist

## 2020-07-19 DIAGNOSIS — Z79899 Other long term (current) drug therapy: Secondary | ICD-10-CM | POA: Diagnosis not present

## 2020-07-19 DIAGNOSIS — H40012 Open angle with borderline findings, low risk, left eye: Secondary | ICD-10-CM | POA: Diagnosis not present

## 2020-07-19 DIAGNOSIS — H40021 Open angle with borderline findings, high risk, right eye: Secondary | ICD-10-CM | POA: Diagnosis not present

## 2020-07-23 NOTE — Progress Notes (Signed)
Chronic Care Management Pharmacy Assistant   Name: Angela Simmons  MRN: [id number] DOB: 09/18/1945  Reason for Encounter: Disease State   Conditions to be addressed/monitored: HTN   Recent office visits:  None ID  Recent consult visits:  07/15/20 Ermalinda Barrios PA-C Cardiologyfor Afib, no medication changes  Hospital visits:  None in previous 6 months  Medications: Outpatient Encounter Medications as of 07/19/2020  Medication Sig   atorvastatin (LIPITOR) 10 MG tablet TAKE 1 TABLET BY MOUTH DAILY   benazepril (LOTENSIN) 40 MG tablet Take 40 mg by mouth daily.   Blood Glucose Monitoring Suppl (ONETOUCH VERIO) w/Device KIT USE TO TEST BLOOD SUGAR ONCE DAILY AS DIRECTED   diclofenac sodium (VOLTAREN) 1 % GEL Apply 3 g to 3 large joints up to 3 times daily.   ELIQUIS 5 MG TABS tablet TAKE 1 TABLET BY MOUTH TWICE DAILY   fluticasone (FLONASE) 50 MCG/ACT nasal spray USE 2 SPRAYS IN EACH NOSTRIL EVERY DAY   Ginger, Zingiber officinalis, 550 MG CAPS Take 1 capsule (550 mg total) by mouth daily.   glucose blood test strip USE TO TEST BLOOD SUGAR ONCE DAILY. E11.40   hydroxychloroquine (PLAQUENIL) 200 MG tablet TAKE 1 TABLET BY MOUTH ONCE DAILY ON MONDAY-FRIDAY   loratadine (CLARITIN) 10 MG tablet TAKE 1 TABLET BY MOUTH EVERY DAY   metoprolol tartrate (LOPRESSOR) 25 MG tablet TAKE 1 TABLET BY MOUTH TWICE DAILY   Multiple Vitamins-Minerals (CENTRUM SILVER 50+WOMEN) TABS Take 1 tablet by mouth daily at 6 (six) AM.   Omega-3 Fatty Acids (FISH OIL) 1000 MG CAPS Take 1,000 mg by mouth daily.   omeprazole (PRILOSEC) 40 MG capsule TAKE ONE CAPSULE BY MOUTH DAILY   ONETOUCH DELICA LANCETS 24P MISC USE TO TEST ONCE DAILY   ONETOUCH VERIO test strip USE TO TEST BLOOD SUGAR ONCE DAILY   Turmeric (QC TUMERIC COMPLEX) 500 MG CAPS Take 1 capsule by mouth daily.   No facility-administered encounter medications on file as of 07/19/2020.    Pharmacist Review Reviewed chart prior to disease state call.  Spoke with patient regarding BP  Recent Office Vitals: BP Readings from Last 3 Encounters:  07/15/20 (!) 150/84  06/28/20 (!) 146/72  06/05/20 (!) 160/90   Pulse Readings from Last 3 Encounters:  07/15/20 66  06/28/20 62  06/05/20 68    Wt Readings from Last 3 Encounters:  07/15/20 192 lb 9.6 oz (87.4 kg)  06/28/20 188 lb (85.3 kg)  06/05/20 193 lb (87.5 kg)     Kidney Function Lab Results  Component Value Date/Time   CREATININE 0.96 06/28/2020 12:48 PM   CREATININE 0.92 04/23/2020 11:44 AM   CREATININE 0.90 01/05/2020 03:23 PM   CREATININE 0.92 11/07/2019 10:30 AM   GFR 58.46 (L) 06/28/2020 12:48 PM   GFRNONAA 62 04/23/2020 11:44 AM   GFRAA 72 04/23/2020 11:44 AM    BMP Latest Ref Rng & Units 06/28/2020 04/23/2020 01/05/2020  Glucose 70 - 99 mg/dL 80 125(H) 91  BUN 6 - 23 mg/dL 12 11 11   Creatinine 0.40 - 1.20 mg/dL 0.96 0.92 0.90  BUN/Creat Ratio 6 - 22 (calc) - NOT APPLICABLE -  Sodium 809 - 145 mEq/L 140 139 137  Potassium 3.5 - 5.1 mEq/L 4.0 4.2 3.6  Chloride 96 - 112 mEq/L 103 104 106  CO2 19 - 32 mEq/L 28 29 23   Calcium 8.4 - 10.5 mg/dL 9.4 9.4 9.0    Current antihypertensive regimen:  Amlodipine 5 mg daily Benazepril 40 mg daily  How often are you checking your Blood Pressure? daily  Current home BP readings: Patent states reading have been: 07/18/20-122/75 07/19/20-139/83 07/20/20-139/78 07/21/20-115/62 07/24/20-121/83  What recent interventions/DTPs have been made by any provider to improve Blood Pressure control since last CPP Visit: None noted  Any recent hospitalizations or ED visits since last visit with CPP? No  What diet changes have been made to improve Blood Pressure Control?  Patient states she is cutting back on salt and sugar  What exercise is being done to improve your Blood Pressure Control?  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  Yes Does the patient have >5 day gap between last estimated fill dates? No    Star Rating Drugs: Atorvastatin 06/25/20 30 ds Benazepril 04/27/20 90 ds (patient getting refill this week)  Ethelene Hal Clinical Pharmacist Assistant [phone]   Time spent:55

## 2020-08-12 ENCOUNTER — Telehealth: Payer: Self-pay | Admitting: Internal Medicine

## 2020-08-12 ENCOUNTER — Other Ambulatory Visit: Payer: Self-pay

## 2020-08-12 MED ORDER — BENAZEPRIL HCL 40 MG PO TABS: 40.0000 mg | ORAL_TABLET | Freq: Every day | ORAL | 0 refills | Status: DC

## 2020-08-12 NOTE — Telephone Encounter (Signed)
1.Medication Requested: benazepril (LOTENSIN) 40 MG tablet  2. Pharmacy (Name, [place]):  [facility] [place] Phone:  [phone]  Fax:  [phone]      3. On Med List:  y  46. Last Visit with PCP: 6.10.22  5. Next visit date with PCP:  12.16.22   Agent: Please be advised that RX refills may take up to 3 business days. We ask that you follow-up with your pharmacy.

## 2020-08-14 ENCOUNTER — Other Ambulatory Visit: Payer: Self-pay | Admitting: Internal Medicine

## 2020-08-14 NOTE — Telephone Encounter (Signed)
Patient called about the medication below, went to [facility] to pick it up today and they didn't have it ready.

## 2020-08-15 ENCOUNTER — Telehealth: Payer: Self-pay

## 2020-08-15 ENCOUNTER — Other Ambulatory Visit: Payer: Self-pay

## 2020-08-15 MED ORDER — BENAZEPRIL HCL 40 MG PO TABS: 40.0000 mg | ORAL_TABLET | Freq: Every day | ORAL | 0 refills | Status: DC

## 2020-08-15 NOTE — Telephone Encounter (Signed)
Sent in today 

## 2020-08-16 NOTE — Telephone Encounter (Signed)
Called pt and left  voicemail message to call back and schedule bone density.

## 2020-08-19 ENCOUNTER — Ambulatory Visit: Payer: PPO | Admitting: Cardiology

## 2020-08-21 ENCOUNTER — Encounter: Payer: Self-pay | Admitting: Cardiology

## 2020-08-21 ENCOUNTER — Other Ambulatory Visit: Payer: Self-pay

## 2020-08-21 ENCOUNTER — Ambulatory Visit: Payer: PPO | Admitting: Cardiology

## 2020-08-21 VITALS — BP 140/90 | HR 65 | Ht 69.0 in | Wt 190.0 lb

## 2020-08-21 DIAGNOSIS — R0789 Other chest pain: Secondary | ICD-10-CM | POA: Diagnosis not present

## 2020-08-21 DIAGNOSIS — I10 Essential (primary) hypertension: Secondary | ICD-10-CM | POA: Diagnosis not present

## 2020-08-21 DIAGNOSIS — E782 Mixed hyperlipidemia: Secondary | ICD-10-CM

## 2020-08-21 DIAGNOSIS — I4892 Unspecified atrial flutter: Secondary | ICD-10-CM | POA: Diagnosis not present

## 2020-08-21 NOTE — Progress Notes (Signed)
Clinical Summary Angela Simmons is a 75 y.o.female  1. Aflutter/ Atach - noted during 07/2019 admission. Started on lopressor and eliquis at that time.  - - seen in ER 08/23/19 with palpitations, SOB. EKG at the stime shows NSR   - recent episodes of palpitations. Often in the morning. Heart rates at home elevated to 150s a ttimes, particulary in the morning - she is no longer taking metoprolol, I cannot tell from her chart what happened to it. Has been started on coreg 6.14m bid appears during the 08/23/19 ER visit     - last vist was started on dilt 180, this was increased to 2436mby pcp. She felt like the 240 was too strong for her, made her feel dizzy,  and is back on the 18064m at times palpitaitons, typically with activities. Lasts a few seconds - no bleeding on eliquis.   - some fatigue on higher dilt doses, improved on 120m23mily  06/2020 monitor showed infrequent short runs of SVT.  -no significant palpitations - compliant with meds. No bleeding on elquis   2. History of chest pain - cardiac catheterization in 11/2019 showed a normal left system as outlined above but the RCA was unable to be engaged. Medical therapy was recommended unless she had refractory symptoms - very infrequent symptoms    3. HTN - compliant with meds - home bp's 120s-130s/60s-70s  4. Hyperlipdiemia 06/2020 TC 132 TG 44 HDL 47 LDL 76 - she is on atorvastatin 10mg65m SH: husband with recent fall, multiple back fractures. Remains limited at home.   Past Medical History:  Diagnosis Date   Breast cancer (HCC) [place]   right breast   [place]    GERD (gastroesophageal reflux disease)    HTN (hypertension)    Hypercholesterolemia    Obesity    Osteoarthritis      Allergies  Allergen Reactions   Levofloxacin Hives and Itching   Nitrofurantoin Hives and Itching   Penicillins Hives and Itching   Sulfonamide  Derivatives Itching   Pantoprazole Other (See Comments)    Bad dreams     Current Outpatient Medications  Medication Sig Dispense Refill   atorvastatin (LIPITOR) 10 MG tablet TAKE 1 TABLET BY MOUTH DAILY 90 tablet 0   benazepril (LOTENSIN) 40 MG tablet Take 1 tablet (40 mg total) by mouth daily. 90 tablet 0   Blood Glucose Monitoring Suppl (ONETOUCH VERIO) w/Device KIT USE TO TEST BLOOD SUGAR ONCE DAILY AS DIRECTED 1 kit 0   diclofenac sodium (VOLTAREN) 1 % GEL Apply 3 g to 3 large joints up to 3 times daily. 3 Tube 3   diltiazem (CARDIZEM CD) 120 MG 24 hr capsule Take 1 capsule (120 mg total) by mouth daily. 90 capsule 3   ELIQUIS 5 MG TABS tablet TAKE 1 TABLET BY MOUTH TWICE DAILY 60 tablet 6   fluticasone (FLONASE) 50 MCG/ACT nasal spray USE 2 SPRAYS IN EACH NOSTRIL EVERY DAY 48 g 0   Ginger, Zingiber officinalis, 550 MG CAPS Take 1 capsule (550 mg total) by mouth daily. 60 capsule 0   glucose blood test strip USE TO TEST BLOOD SUGAR ONCE DAILY. E11.40 100 each 3   hydroxychloroquine (PLAQUENIL) 200 MG tablet TAKE 1 TABLET BY MOUTH ONCE DAILY ON MONDAY-FRIDAY 60 tablet 0   loratadine (CLARITIN) 10 MG tablet TAKE 1 TABLET BY MOUTH EVERY DAY 30 tablet 2  metoprolol tartrate (LOPRESSOR) 25 MG tablet TAKE 1 TABLET BY MOUTH TWICE DAILY 60 tablet 6   Multiple Vitamins-Minerals (CENTRUM SILVER 50+WOMEN) TABS Take 1 tablet by mouth daily at 6 (six) AM.     nitroGLYCERIN (NITROSTAT) 0.4 MG SL tablet Place 1 tablet (0.4 mg total) under the tongue every 5 (five) minutes as needed for chest pain. 25 tablet 3   Omega-3 Fatty Acids (FISH OIL) 1000 MG CAPS Take 1,000 mg by mouth daily.     omeprazole (PRILOSEC) 40 MG capsule TAKE ONE CAPSULE BY MOUTH DAILY 90 capsule 3   ONETOUCH DELICA LANCETS 83M MISC USE TO TEST ONCE DAILY 100 each 2   ONETOUCH VERIO test strip USE TO TEST BLOOD SUGAR ONCE DAILY 100 each 0   Turmeric (QC TUMERIC COMPLEX) 500 MG CAPS Take 1 capsule by mouth daily.     No current  facility-administered medications for this visit.     Past Surgical History:  Procedure Laterality Date   ABDOMINAL HYSTERECTOMY  1993   [place], fib[place]   AORTIC ARCH ANGIOGRAPHY N/A 12/07/2019   Procedure: AORTIC ARCH ANGIOGRAPHY;  Surgeon: Lorretta Harp, MD;  Location: [facility];  Service: Cardiovascular;  Laterality: N/A;   COLONOSCOPY  May 2003   Dr. Tamala Julian: normal   COLONOSCOPY  06/22/2011   Procedure: COLONOSCOPY;  Surgeon: Danie Binder, MD;  Location: [facility];  Service: Endoscopy;  Laterality: N/A;  10:30   LEFT HEART CATH AND CORONARY ANGIOGRAPHY N/A 12/07/2019   Procedure: LEFT HEART CATH AND CORONARY ANGIOGRAPHY;  Surgeon: Lorretta Harp, MD;  Location: [facility];  Service: Cardiovascular;  Laterality: N/A;   MASTECTOMY  1995   right      Allergies  Allergen Reactions   Levofloxacin Hives and Itching   Nitrofurantoin Hives and Itching   Penicillins Hives and Itching   Sulfonamide Derivatives Itching   Pantoprazole Other (See Comments)    Bad dreams      Family History  Problem Relation Age of Onset   Diabetes Mother        diabetic coma   Heart failure Mother    Diabetes Father    Emphysema Father    Diabetes Sister    Hypercholesterolemia Sister    Hypercholesterolemia Brother    Diabetes Brother    Hypertension Brother    Hypercholesterolemia Brother    Diabetes Sister    Hypertension Sister    Hypercholesterolemia Sister    Hypercholesterolemia Sister    Hypercholesterolemia Sister    Diabetes Brother    Hypertension Brother    Colon cancer Neg Hx      Social History Angela Simmons reports that she has never smoked. She has never used smokeless tobacco. Angela Simmons reports no history of alcohol use.   Review of Systems CONSTITUTIONAL: No weight loss, fever, chills, weakness or fatigue.  HEENT: Eyes: No visual loss, blurred vision, double vision or yellow sclerae.No hearing loss, sneezing, congestion, runny nose or  sore throat.  SKIN: No rash or itching.  CARDIOVASCULAR: per hpi RESPIRATORY: No shortness of breath, cough or sputum.  GASTROINTESTINAL: No anorexia, nausea, vomiting or diarrhea. No abdominal pain or blood.  GENITOURINARY: No burning on urination, no polyuria NEUROLOGICAL: No headache, dizziness, syncope, paralysis, ataxia, numbness or tingling in the extremities. No change in bowel or bladder control.  MUSCULOSKELETAL: No muscle, back pain, joint pain or stiffness.  LYMPHATICS: No enlarged nodes. No history of splenectomy.  PSYCHIATRIC: No history of depression or  anxiety.  ENDOCRINOLOGIC: No reports of sweating, cold or heat intolerance. No polyuria or polydipsia.  Marland Kitchen   Physical Examination Today's Vitals   08/21/20 1020  BP: 140/90  Pulse: 65  SpO2: 98%  Weight: 190 lb (86.2 kg)  Height: $Remove'5\' 9"'[id number]$  (1.753 m)  PainSc: 0-No pain   Body mass index is 28.06 kg/m.  Gen: resting comfortably, no acute distress HEENT: no scleral icterus, pupils equal round and reactive, no palptable cervical adenopathy,  CV: RRR, no m/r/g no jvd Resp: Clear to auscultation bilaterally GI: abdomen is soft, non-tender, non-distended, normal bowel sounds, no hepatosplenomegaly MSK: extremities are warm, no edema.  Skin: warm, no rash Neuro:  no focal deficits Psych: appropriate affect   Diagnostic Studies 05/2019 nuclear stress No diagnostic ST segment changes to indicate ischemia. No significant myocardial perfusion defects to indicate scar or ischemia. There is apical breast attenuation artifact This is a low risk study. Nuclear stress EF: 72%.   04/2019 echo IMPRESSIONS     1. Left ventricular ejection fraction, by estimation, is 60 to 65%. The  left ventricle has normal function. The left ventricle has no regional  wall motion abnormalities. There is mild left ventricular hypertrophy.  Left ventricular diastolic parameters  were normal.   2. Right ventricular systolic function is normal.  The right ventricular  size is normal. There is mildly elevated pulmonary artery systolic  pressure.   3. The mitral valve is normal in structure. Trivial mitral valve  regurgitation. No evidence of mitral stenosis.   4. The aortic valve is tricuspid. Aortic valve regurgitation is not  visualized. No aortic stenosis is present.   5. The inferior vena cava is normal in size with greater than 50%  respiratory variability, suggesting right atrial pressure of 3 mmHg.  11/2019 cath IMPRESSION: Angela Simmons has normal left coronary system.  She has an apparent takeoff RCA from close to the left main takeoff.  I was unable to selectively intubate this from the right radial approach.  I did use 180 cc of contrast and I aborted the procedure.  She will need to return for RCA angiography via the right femoral approach.  The sheath was removed and a TR band was placed on the right wrist to achieve patent hemostasis.  The patient left lab in stable condition.  She will be discharged home today as an outpatient.  06/2020 event monitor Rare supraventricular ectopy. Rare short runs of SVT up to 12 beats. Rare ventricular ectopy Reported episode of "fainting" correlated with sinus rhythm at 60 bpm     Patch Wear Time:  13 days and 16 hours (2022-05-18T11:46:34-0400 to 2022-06-01T04:15:38-0400)   Patient had a min HR of 41 bpm, max HR of 174 bpm, and avg HR of 55 bpm. Predominant underlying rhythm was Sinus Rhythm. 27 Supraventricular Tachycardia runs occurred, the run with the fastest interval lasting 5 beats with a max rate of 174 bpm, the longest lasting 12 beats with an avg rate of 113 bpm. Isolated SVEs were rare (<1.0%), SVE Couplets were rare (<1.0%), and SVE Triplets were rare (<1.0%). Isolated VEs were rare (<1.0%), VE Couplets were rare (<1.0%), and no VE Triplets were present. Ventricular Bigeminy was present.  Assessment and Plan    1. Aflutter/atach -overall controlled,continue current  meds  2. HTN - elevated in clinic, home numbers are at goal. Continue current meds  3. Hyperipidemia - she is at goal, continue atorvastatin  4. Chest pain - no signficant regular symptoms, cath as reported  above - continue to monitor.       Arnoldo Lenis, M.D.

## 2020-08-21 NOTE — Patient Instructions (Addendum)

## 2020-08-22 ENCOUNTER — Encounter: Payer: Self-pay | Admitting: Internal Medicine

## 2020-08-22 ENCOUNTER — Telehealth: Payer: PPO

## 2020-08-22 ENCOUNTER — Telehealth: Payer: Self-pay | Admitting: Pharmacist

## 2020-08-22 DIAGNOSIS — I4892 Unspecified atrial flutter: Secondary | ICD-10-CM | POA: Insufficient documentation

## 2020-08-22 NOTE — Progress Notes (Deleted)
Chronic Care Management Pharmacy Note  08/22/2020 Name:  Angela Simmons MRN:  [id number] DOB:  1945-07-20  Summary: ***  Recommendations/Changes made from today's visit: ***  Plan: ***   Subjective: Angela Simmons is an 75 y.o. year old female who is a primary patient of Burns, Angela Lick, MD.  The CCM team was consulted for assistance with disease management and care coordination needs.    Engaged with patient by telephone for follow up visit in response to provider referral for pharmacy case management and/or care coordination services.   Consent to Services:  The patient was given information about Chronic Care Management services, agreed to services, and gave verbal consent prior to initiation of services.  Please see initial visit note for detailed documentation.   Patient Care Team: Binnie Rail, MD as PCP - General (Internal Medicine) Harl Bowie Alphonse Guild, MD as PCP - Cardiology (Cardiology) Danie Binder, MD (Inactive) (Gastroenterology) Bo Merino, MD as Consulting Physician (Rheumatology) Charlton Haws, [facility] as Pharmacist (Pharmacist)  Recent office visits: 06/28/20 Dr Quay Burow OV: chronic f/u, elevated BP at home w/ HA. No med changes, monitor BP at home  Recent consult visits: 08/21/20 Dr Harl Bowie (cardiology): f/u [place], no med changes. 07/15/20 Ermalinda Barrios PA-C Cardiology: for Afib, no medication changes  06/05/20 PA Lenze (cardiology): c/o SOB; place Zio monitor x 2 wks. Dc'd benazeprl d/t pt not taking.  4/8-5/13/22 PT for knee pain  04/23/20 Dr Estanislado Pandy (rheumatology): f/u [place]; labs do not indicate active disease; no changes  Hospital visits: None in previous 6 months   Objective:  Lab Results  Component Value Date   CREATININE 0.96 06/28/2020   BUN 12 06/28/2020   GFR 58.46 (L) 06/28/2020   GFRNONAA 62 04/23/2020   GFRAA 72 04/23/2020   NA 140 06/28/2020   K 4.0 06/28/2020   CALCIUM 9.4 06/28/2020   CO2 28 06/28/2020   GLUCOSE  80 06/28/2020    Lab Results  Component Value Date/Time   HGBA1C 6.3 06/28/2020 12:48 PM   HGBA1C 6.3 12/27/2019 12:43 PM   GFR 58.46 (L) 06/28/2020 12:48 PM   GFR 59.41 (L) 12/27/2019 12:43 PM   MICROALBUR 1.1 02/26/2016 11:02 AM   MICROALBUR 0.5 11/23/2013 03:51 PM    Last diabetic Eye exam:  Lab Results  Component Value Date/Time   HMDIABEYEEXA No Retinopathy 01/10/2020 10:51 AM    Last diabetic Foot exam: No results found for: HMDIABFOOTEX   Lab Results  Component Value Date   CHOL 132 06/28/2020   HDL 46.70 06/28/2020   LDLCALC 76 06/28/2020   TRIG 44.0 06/28/2020   CHOLHDL 3 06/28/2020    Hepatic Function Latest Ref Rng & Units 06/28/2020 04/23/2020 12/27/2019  Total Protein 6.0 - 8.3 g/dL 7.9 7.2 7.5  Albumin 3.5 - 5.2 g/dL 4.1 - 3.8  AST 0 - 37 U/L 16 14 17   ALT 0 - 35 U/L 15 16 14   Alk Phosphatase 39 - 117 U/L 69 - 58  Total Bilirubin 0.2 - 1.2 mg/dL 0.5 0.5 0.5  Bilirubin, Direct 0.0 - 0.3 mg/dL - - -    Lab Results  Component Value Date/Time   TSH 1.515 01/05/2020 03:23 PM   TSH 1.74 12/27/2019 12:43 PM   TSH 2.31 02/26/2016 11:02 AM    CBC Latest Ref Rng & Units 04/23/2020 01/05/2020 12/27/2019  WBC 3.8 - 10.8 Thousand/uL 3.5(L) 3.9(L) 3.5(L)  Hemoglobin 11.7 - 15.5 g/dL 12.5 12.5 12.2  Hematocrit 35.0 - 45.0 % 38.8 40.1  36.7  Platelets 140 - 400 Thousand/uL 224 181 204.0    Lab Results  Component Value Date/Time   VD25OH 35 06/10/2009 06:06 PM   CHA2DS2-VASc Score = 4  The patient's score is based upon: CHF History: No HTN History: Yes Diabetes History: Yes Stroke History: No Vascular Disease History: No   {Confirm score is correct.  If not, click here to update score.  REFRESH note. :[id number]}  Clinical ASCVD: Yes  The 10-year ASCVD risk score Mikey Bussing DC Jr., et al., 2013) is: 24.4%   Values used to calculate the score:     Age: 42 years     Sex: Female     Is Non-Hispanic African American: Yes     Diabetic: Yes     Tobacco smoker: No      Systolic Blood Pressure: 782 mmHg     Is BP treated: Yes     HDL Cholesterol: 46.7 mg/dL     Total Cholesterol: 132 mg/dL    Depression screen [facility] 2/9 12/27/2019 12/06/2018 03/18/2017  Decreased Interest 0 0 0  Down, Depressed, Hopeless 0 0 0  PHQ - 2 Score 0 0 0  Some recent data might be hidden    Social History   Tobacco Use  Smoking Status Never  Smokeless Tobacco Never   BP Readings from Last 3 Encounters:  08/21/20 140/90  07/15/20 (!) 150/84  06/28/20 (!) 146/72   Pulse Readings from Last 3 Encounters:  08/21/20 65  07/15/20 66  06/28/20 62   Wt Readings from Last 3 Encounters:  08/21/20 190 lb (86.2 kg)  07/15/20 192 lb 9.6 oz (87.4 kg)  06/28/20 188 lb (85.3 kg)   BMI Readings from Last 3 Encounters:  08/21/20 28.06 kg/m  07/15/20 28.44 kg/m  06/28/20 27.76 kg/m    Assessment/Interventions: Review of patient past medical history, allergies, medications, health status, including review of consultants reports, laboratory and other test data, was performed as part of comprehensive evaluation and provision of chronic care management services.   SDOH:  (Social Determinants of Health) assessments and interventions performed: Yes  SDOH Screenings   Alcohol Screen: Not on file  Depression (PHQ2-9): Low Risk    PHQ-2 Score: 0  Financial Resource Strain: Low Risk    Difficulty of Paying Living Expenses: Not very hard  Food Insecurity: Not on file  Housing: Not on file  Physical Activity: Not on file  Social Connections: Not on file  Stress: Not on file  Tobacco Use: Low Risk    Smoking Tobacco Use: Never   Smokeless Tobacco Use: Never  Transportation Needs: Not on file    [place]  Allergies  Allergen Reactions   Levofloxacin Hives and Itching   Nitrofurantoin Hives and Itching   Penicillins Hives and Itching   Sulfonamide Derivatives Itching   Pantoprazole Other (See Comments)    Bad dreams    Medications Reviewed Today     Reviewed by  Derrick Ravel, CMA (Certified Medical Assistant) on 08/21/20 at [place] List Status: <None>   Medication Order Taking? Sig Documenting Provider Last Dose Status Informant  atorvastatin (LIPITOR) 10 MG tablet [id number] Yes TAKE 1 TABLET BY MOUTH DAILY Burns, Angela Lick, MD Taking Active   benazepril (LOTENSIN) 40 MG tablet [id number] Yes Take 1 tablet (40 mg total) by mouth daily. Binnie Rail, MD Taking Active   Blood Glucose Monitoring Suppl [facility] VERIO) w/Device Drucie Opitz [id number] Yes USE TO TEST BLOOD SUGAR ONCE DAILY AS DIRECTED Billey Gosling  J, MD Taking Active Self  diclofenac sodium (VOLTAREN) 1 % GEL [id number] Yes Apply 3 g to 3 large joints up to 3 times daily. Ofilia Neas, PA-C Taking Active Self  diltiazem (CARDIZEM CD) 120 MG 24 hr capsule [id number]  Take 1 capsule (120 mg total) by mouth daily. Charlie Pitter, PA-C  Expired 07/15/20 2359   ELIQUIS 5 MG TABS tablet [id number] Yes TAKE 1 TABLET BY MOUTH TWICE DAILY Branch, Alphonse Guild, MD Taking Active   fluticasone [facility]) 50 MCG/ACT nasal spray [id number] Yes USE 2 SPRAYS IN EACH NOSTRIL EVERY DAY Fayrene Helper, MD Taking Active   Ginger, Zingiber officinalis, 550 MG CAPS [id number] Yes Take 1 capsule (550 mg total) by mouth daily. Binnie Rail, MD Taking Active   glucose blood test strip [id number] Yes USE TO TEST BLOOD SUGAR ONCE DAILY. E11.40 Binnie Rail, MD Taking Active Self  hydroxychloroquine (PLAQUENIL) 200 MG tablet [id number] Yes TAKE 1 TABLET BY MOUTH ONCE DAILY ON MONDAY-FRIDAY Deveshwar, Abel Presto, MD Taking Active   loratadine (CLARITIN) 10 MG tablet [id number] Yes TAKE 1 TABLET BY MOUTH EVERY DAY Fayrene Helper, MD Taking Active   metoprolol tartrate (LOPRESSOR) 25 MG tablet [id number] Yes TAKE 1 TABLET BY MOUTH TWICE DAILY Branch, Alphonse Guild, MD Taking Active   Multiple Vitamins-Minerals (CENTRUM SILVER 50+WOMEN) TABS [id number] Yes Take 1 tablet by mouth daily at 6 (six) AM. Quay Burow, Angela Lick, MD Taking Active Self   nitroGLYCERIN (NITROSTAT) 0.4 MG SL tablet [id number]  Place 1 tablet (0.4 mg total) under the tongue every 5 (five) minutes as needed for chest pain. Erlene Quan, [place]  Expired 07/15/20 2359 Self  Omega-3 Fatty Acids (FISH OIL) 1000 MG CAPS [id number] Yes Take 1,000 mg by mouth daily. [provider] Taking Active Self  omeprazole (PRILOSEC) 40 MG capsule [id number] Yes TAKE ONE CAPSULE BY MOUTH DAILY Binnie Rail, MD Taking Active   [facility] LANCETS 03P [place] [id number] Yes USE TO TEST ONCE DAILY Binnie Rail, MD Taking Active Self  [facility] VERIO test strip [id number] Yes USE TO TEST BLOOD SUGAR ONCE DAILY Quay Burow Angela Lick, MD Taking Active Self  Turmeric (QC TUMERIC COMPLEX) 500 MG CAPS [id number] Yes Take 1 capsule by mouth daily. Binnie Rail, MD Taking Active             Patient Active Problem List   Diagnosis Date Noted   Aortic atherosclerosis ([place]) 06/28/2020   Secondary hypercoagulable state ([place]) 12/26/2019   Anticoagulated 12/05/2019   PAF (paroxysmal atrial fibrillation) ([place]) 08/04/2019   Thrush 08/02/2019   Community acquired pneumonia 07/22/2019   DOE (dyspnea on exertion) 04/03/2019   Ankle swelling, left 04/03/2019   Hyperpigmented skin lesion 04/03/2019   Poor balance 12/06/2018   Headache 12/06/2018   Vertigo 06/07/2018   Urinary frequency 06/07/2018   Lower abdominal pain 06/07/2018   Herpes zoster without complication 03/83/3383   Tingling in extremities 11/26/2017   Ischial bursitis of left side 06/02/2017   Sicca syndrome ([place]) 06/23/2016   Bilateral leg edema 04/07/2016   Burning sensation of skin 04/07/2016   PMR (polymyalgia rheumatica) ([place]) 03/27/2016   Osteopenia 03/04/2016   Autoimmune disease ([place]) 03/18/2015   Inflammatory arthritis 03/18/2015   Joint pain 11/22/2014   Overweight (BMI 25.0-29.9) 08/18/2014   Low back pain with radiation 08/18/2014   Hyperlipidemia 06/28/2014   Chest pain with moderate risk of acute coronary  syndrome 04/11/2014   Tendonitis of shoulder 08/29/2013   S/P right  mastectomy 11/14/2012   Type 2 diabetes, controlled, with neuropathy ([place]) 10/05/2012   Allergic rhinitis 01/26/2011   GERD 11/10/2009   INTERSTITIAL CYSTITIS 10/31/2008   BENIGN NEOPLASM OF VULVA 02/01/2008   Essential hypertension, benign 02/08/2007   History of cancer of right breast 02/08/2007    Immunization History  Administered Date(s) Administered   Fluad Quad(high Dose 65+) 12/06/2018, 12/27/2019   Influenza Split 11/14/2010   Influenza Whole 10/18/2006, 10/31/2008, 11/06/2009   Influenza, High Dose Seasonal PF 11/22/2014, 02/21/2016, 12/18/2016, 11/05/2017   Influenza,inj,Quad PF,6+ Mos 11/14/2012, 11/23/2013   Moderna Sars-Covid-2 Vaccination 03/16/2019, 04/13/2019   Pneumococcal Conjugate-13 04/11/2014   Pneumococcal Polysaccharide-23 09/17/2011   Td 07/05/2003   Zoster Recombinat (Shingrix) 11/30/2017, 02/02/2018   Zoster, Live 06/15/2013    Conditions to be addressed/monitored:  Hypertension, Hyperlipidemia, Diabetes, Atrial Fibrillation, Coronary Artery Disease, and Osteopenia  There are no care plans that you recently modified to display for this patient.    Medication Assistance: None required.  Patient affirms current coverage meets needs.  Compliance/Adherence/Medication fill history: Care Gaps: TDAP (due 07/04/13) Covid booster (due 05/11/19) Dexa scan (due 02/26/19) Foot exam (due 06/20/20)  Star-Rating Drugs: Benazepril - LF 08/14/20 x 90 ds Atorvastatin - LF 07/25/20 x 30 ds  Patient's preferred pharmacy is:  [place], [place] [address] [place] [address] Phone: [phone] Fax: [phone]  Uses pill box? Yes Pt endorses 100% compliance  We discussed: {Pharmacy options:24294} Patient decided to: {US Pharmacy Plan:23885}  Care Plan and Follow Up Patient Decision:  {FOLLOWUP:24991}  Plan: {CM FOLLOW UP  YDXA:12878}  ***    Current Barriers:  {pharmacybarriers:24917}  Pharmacist Clinical Goal(s):  Patient will {PHARMACYGOALCHOICES:24921} through collaboration with PharmD and provider.   Interventions: 1:1 collaboration with Binnie Rail, MD regarding development and update of comprehensive plan of care as evidenced by provider attestation and co-signature Inter-disciplinary care team collaboration (see longitudinal plan of care) Comprehensive medication review performed; medication list updated in electronic medical record  AFIB    New dx July 2021. CHADSVASC=4 Patient is currently rate controlled.  Patient has failed these meds in past: n/a Patient is currently controlled on the following medications: Carvedilol 6.25 mg BID Eliquis 5 mg BID   We discussed: what Afib is, increased risk of stroke with Afib, how medications work to reduce risks; bleeding risk with Eliquis; pt denies signs/sx of bleeding   Plan: Continue current medications   Hypertension    BP goal is:  <140/90 Patient checks BP at home daily Patient home BP readings are ranging:  8/6:160/100, 156/101, 132/69 (in pain that day) 8/7: 144/87 8/8: 131/78 8/9: 138/92 8/10: 144/92 8/11: 133/96 8/12: 136/84 HR 65-80   Patient has failed these meds in the past: amlodipine, carvedilol Patient is currently controlled on the following medications: Benazepril 40 mg daily Metoprolol tartrate 25 mg BID Diltiazem 120 mg daily  We discussed BP goals; benefits of medications; pt denies side effects   Plan: Continue current medications    Hyperlipidemia    LDL goal < 70 Aortic atherosclerosis   Patient has failed these meds in past: n/a Patient is currently controlled on the following medications: Atorvastatin 10 mg daily HS OTC Fish oil 1000 mg daily Nitroglycerin 0.4 mg SL prn   We discussed:  Cholesterol goals; benefits of statin for ASCVD risk reduction; pt denies side effects   Plan: Continue  current medications   Diabetes    A1c goal <7% Checking BG:  Daily Recent FBG Readings: 90, 92 (always <100)   Patient has failed these meds in past: n/a Patient is currently controlled on the following medications: No medications   We discussed: diet and exercise extensively   Plan: Continue control with diet and exercise   Polymyalgia Rheumatica    Patient has failed these meds in past: n/a Patient is currently controlled on the following medications: Hydroxychloroquine 200 mg daily M-F Diclofenac 1% gel TID   We discussed:  Pt has been on immunosuppressants for a few years now, denies issues. Pt reports she is not taking folic acid right now due to running out; encouraged her to renew her supply and start taking again   Plan: Continue current medications   GERD    Patient has failed these meds in past: Patient is currently controlled on the following medications: Omeprazole 40 mg daily   We discussed:  Pt reports she was taken off of famotidine at some point recently; she denies issues with reflux/heartburn currently; discussed famotidine can be used as needed for breakthrough symptoms   Plan: Continue current medications   Osteopenia    Last DEXA Scan: 03/17/2016             T-Score femoral neck: -1.1             T-Score total hip: n/a             T-Score lumbar spine: +1.2             T-Score forearm radius: n/a             10-year probability of major osteoporotic fracture: 5.1%             10-year probability of hip fracture: 0.6%   Patient is not a candidate for pharmacologic treatment   Patient has failed these meds in past: n/a Patient is currently controlled on the following medications: Calcium-Vitamin D 500-200 mg-IU daily   We discussed:  Recommend [phone] units of vitamin D daily. Recommend 1200 mg of calcium daily from dietary and supplemental sources.   Plan: Continue current medications  Patient Goals/Self-Care Activities Patient will:  -  {pharmacypatientgoals:24919}

## 2020-08-22 NOTE — Telephone Encounter (Signed)
  Chronic Care Management   Outreach Note  08/22/2020 Name: Angela Simmons MRN: [id number] DOB: Apr 08, 1945  Referred by: Binnie Rail, MD  Patient had a phone appointment scheduled with clinical pharmacist today.  An unsuccessful telephone outreach was attempted today. The patient was referred to the pharmacist for assistance with medications, care management and care coordination.   Patient will NOT be penalized in any way for missing a CCM appointment. The no-show fee does not apply.  If possible, a message was left to return call to: [phone] or to [facility]: Muir, PharmD, Para March, CPP Clinical Pharmacist [facility] at [facility] [phone]

## 2020-08-23 ENCOUNTER — Telehealth: Payer: Self-pay

## 2020-08-23 DIAGNOSIS — Z780 Asymptomatic menopausal state: Secondary | ICD-10-CM

## 2020-08-23 NOTE — Telephone Encounter (Signed)
Bone Density order needed for Mon 8/8/ appt.

## 2020-08-26 ENCOUNTER — Other Ambulatory Visit: Payer: Self-pay

## 2020-08-26 ENCOUNTER — Ambulatory Visit (INDEPENDENT_AMBULATORY_CARE_PROVIDER_SITE_OTHER)
Admission: RE | Admit: 2020-08-26 | Discharge: 2020-08-26 | Disposition: A | Discharge status: Home / Self Care | Payer: PPO | Source: Ambulatory Visit | Attending: Internal Medicine | Admitting: Internal Medicine

## 2020-08-26 DIAGNOSIS — Z780 Asymptomatic menopausal state: Secondary | ICD-10-CM

## 2020-09-04 ENCOUNTER — Telehealth: Payer: Self-pay | Admitting: Internal Medicine

## 2020-09-04 NOTE — Telephone Encounter (Signed)
   Please call patient to discuss bone density results

## 2020-09-04 NOTE — Telephone Encounter (Signed)
(  Note in my-chart)  Your bone density shows osteopenia of thinning of your bones.  Your bone density has decreased compared to 2018.  You do not need medication for your bones, but need to take calcium and vitamin d daily and exercise regularly.  We will recheck this in 2 years.

## 2020-09-05 DIAGNOSIS — L84 Corns and callosities: Secondary | ICD-10-CM | POA: Diagnosis not present

## 2020-09-05 DIAGNOSIS — B351 Tinea unguium: Secondary | ICD-10-CM | POA: Diagnosis not present

## 2020-09-05 DIAGNOSIS — E1142 Type 2 diabetes mellitus with diabetic polyneuropathy: Secondary | ICD-10-CM | POA: Diagnosis not present

## 2020-09-05 DIAGNOSIS — M79673 Pain in unspecified foot: Secondary | ICD-10-CM | POA: Diagnosis not present

## 2020-09-05 NOTE — Telephone Encounter (Signed)
Results given to patient today. 

## 2020-09-11 NOTE — Progress Notes (Signed)
Office Visit Note  Patient: Angela Simmons             Date of Birth: [birth year]           MRN: [id number]             PCP: Binnie Rail, MD Referring: Binnie Rail, MD Visit Date: 09/25/2020 Occupation: _0 @  Subjective:  Arthralgias   History of Present Illness: Angela Simmons is a 75 y.o. female with history of Sjogren's syndrome and PMR.  She is taking Plaquenil 200 mg 1 tablet by mouth daily Monday through Friday.  She has not missed any doses of Plaquenil recently.  She denies any signs or symptoms of a polymyalgia rheumatica flare recently.  She states that she continues to have some stiffness in both knee joints but denies any joint swelling currently.  She states that she went to physical therapy for about 4 weeks after her last office visit and noticed an improvement in her symptoms.  She continues to use Voltaren gel topically as needed for pain relief.  She is also been taking fish oil, ginger, and turmeric for the natural anti-inflammatory properties. She continues to experience some dry mouth especially at night.  She uses Biotene products on occasion.  She has not had any increased eye dryness.  Her next ophthalmology appointment is scheduled in December 2022. She had a recent bone density on 08/26/2020.  She has been taking calcium and vitamin D supplement since then.  She denies any recent falls or fractures.   Activities of Daily Living:  Patient reports morning stiffness for 45 minutes.   Patient Reports nocturnal pain.  Difficulty dressing/grooming: Denies Difficulty climbing stairs: Reports Difficulty getting out of chair: Reports Difficulty using hands for taps, buttons, cutlery, and/or writing: Reports  Review of Systems  Constitutional:  Positive for fatigue.  HENT:  Positive for mouth dryness. Negative for mouth sores and nose dryness.   Eyes:  Positive for dryness. Negative for pain and itching.  Respiratory:  Positive for shortness of breath.  Negative for difficulty breathing.   Cardiovascular:  Negative for chest pain and palpitations.  Gastrointestinal:  Negative for blood in stool, constipation and diarrhea.  Endocrine: Negative for increased urination.  Genitourinary:  Negative for difficulty urinating.  Musculoskeletal:  Positive for joint pain, joint pain, joint swelling, myalgias, morning stiffness, muscle tenderness and myalgias.  Skin:  Negative for color change, rash and redness.  Allergic/Immunologic: Negative for susceptible to infections.  Neurological:  Positive for dizziness, numbness, headaches, parasthesias and weakness. Negative for memory loss.  Hematological:  Positive for bruising/bleeding tendency.  Psychiatric/Behavioral:  Negative for confusion.    PMFS History:  Patient Active Problem List   Diagnosis Date Noted   Atrial flutter ([place]) 08/22/2020   Aortic atherosclerosis ([place]) 06/28/2020   Secondary hypercoagulable state ([place]) 12/26/2019   Anticoagulated 12/05/2019   PAF (paroxysmal atrial fibrillation) ([place]) 08/04/2019   Thrush 08/02/2019   Community acquired pneumonia 07/22/2019   DOE (dyspnea on exertion) 04/03/2019   Ankle swelling, left 04/03/2019   Hyperpigmented skin lesion 04/03/2019   Poor balance 12/06/2018   Headache 12/06/2018   Vertigo 06/07/2018   Urinary frequency 06/07/2018   Lower abdominal pain 06/07/2018   Herpes zoster without complication 36/62/9476   Tingling in extremities 11/26/2017   Ischial bursitis of left side 06/02/2017   Sicca syndrome ([place]) 06/23/2016   Bilateral leg edema 04/07/2016   Burning sensation of skin 04/07/2016   PMR (polymyalgia rheumatica) ([place])  03/27/2016   Osteopenia 03/04/2016   Autoimmune disease ([place]) 03/18/2015   Inflammatory arthritis 03/18/2015   Joint pain 11/22/2014   Overweight (BMI 25.0-29.9) 08/18/2014   Low back pain with radiation 08/18/2014   Hyperlipidemia 06/28/2014   Chest pain with moderate risk of acute coronary syndrome  04/11/2014   Tendonitis of shoulder 08/29/2013   S/P right mastectomy 11/14/2012   Type 2 diabetes, controlled, with neuropathy ([place]) 10/05/2012   Allergic rhinitis 01/26/2011   GERD 11/10/2009   INTERSTITIAL CYSTITIS 10/31/2008   BENIGN NEOPLASM OF VULVA 02/01/2008   Essential hypertension, benign 02/08/2007   History of cancer of right breast 02/08/2007    Past Medical History:  Diagnosis Date   Breast cancer ([place]) 1990s   right breast   Collagen vascular disease ([place])    Diabetes mellitus without complication ([place])    GERD (gastroesophageal reflux disease)    HTN (hypertension)    Hypercholesterolemia    Obesity    Osteoarthritis     Family History  Problem Relation Age of Onset   Diabetes Mother        diabetic coma   Heart failure Mother    Diabetes Father    Emphysema Father    Diabetes Sister    Hypercholesterolemia Sister    Hypercholesterolemia Brother    Diabetes Brother    Hypertension Brother    Hypercholesterolemia Brother    Diabetes Sister    Hypertension Sister    Hypercholesterolemia Sister    Hypercholesterolemia Sister    Hypercholesterolemia Sister    Diabetes Brother    Hypertension Brother    Colon cancer Neg Hx    Past Surgical History:  Procedure Laterality Date   ABDOMINAL HY[place]ERECTOMY  [place]   AORTIC ARCH ANGIOGRAPHY N/A 12/07/2019   Procedure: AORTIC ARCH ANGIOGRAPHY;  Surgeon: Lorretta Harp, MD;  Location: [facility];  Service: Cardiovascular;  Laterality: N/A;   COLONOSCOPY  May 2003   Dr. Tamala Julian: normal   COLONOSCOPY  06/22/2011   Procedure: COLONOSCOPY;  Surgeon: Danie Binder, MD;  [facility];  Service: Endoscopy;  Laterality: N/A;  10:30   LEFT HEART CATH AND CORONARY ANGIOGRAPHY N/A 12/07/2019   Procedure: LEFT HEART CATH AND CORONARY ANGIOGRAPHY;  Surgeon: Lorretta Harp, MD;  Location: [facility];  Service: Cardiovascular;  Laterality: N/A;   MASTECTOMY  1995   right     Social History   Social History Narrative   Lives at home with husband   Caffeine, maybe 1 a day   Immunization History  Administered Date(s) Administered   Fluad Quad(high Dose 65+) 12/06/2018, 12/27/2019   Influenza Split 11/14/2010   Influenza Whole 10/18/2006, 10/31/2008, 11/06/2009   Influenza, High Dose Seasonal PF 11/22/2014, 02/21/2016, 12/18/2016, 11/05/2017   Influenza,inj,Quad PF,6+ Mos 11/14/2012, 11/23/2013   Moderna Sars-Covid-2 Vaccination 03/16/2019, 04/13/2019   Pneumococcal Conjugate-13 04/11/2014   Pneumococcal Polysaccharide-23 09/17/2011   Td 07/05/2003   Zoster Recombinat (Shingrix) 11/30/2017, 02/02/2018   Zoster, Live 06/15/2013     Objective: Vital Signs: BP (!) 156/88 (BP Location: Left Arm, Patient Position: Sitting, Cuff Size: Normal)   Pulse (!) 54   Ht _0  (1.753 m)   Wt 191 lb (86.6 kg)   BMI 28.21 kg/m    Physical Exam Vitals and nursing note reviewed.  Constitutional:      Appearance: She is well-developed.  HENT:     Head: Normocephalic and atraumatic.  Eyes:     Conjunctiva/sclera:  Conjunctivae normal.  Pulmonary:     Effort: Pulmonary effort is normal.  Abdominal:     Palpations: Abdomen is soft.  Musculoskeletal:     Cervical back: Normal range of motion.  Skin:    General: Skin is warm and dry.     Capillary Refill: Capillary refill takes less than 2 seconds.  Neurological:     Mental Status: She is alert and oriented to person, place, and time.  Psychiatric:        Behavior: Behavior normal.     Musculoskeletal Exam: C-spine, thoracic spine, lumbar spine have good range of motion with no discomfort.  Shoulder joints, elbow joints, wrist joints, MCPs, PIPs, DIPs have good range of motion with no synovitis.  PIP and DIP prominence consistent with osteoarthritis of both hands.  Complete fist formation bilaterally.  Hip joints have good range of motion with no discomfort.  Knee joints have good range of motion with no warmth  or effusion.  Bilateral knee crepitus noted.  Ankle joints have good range of motion with no tenderness or joint swelling.  CDAI Exam: CDAI Score: -- Patient Global: --; Provider Global: -- Swollen: --; Tender: -- Joint Exam 09/25/2020   No joint exam has been documented for this visit   There is currently no information documented on the homunculus. Go to the Rheumatology activity and complete the homunculus joint exam.  Investigation: No additional findings.  Imaging: DG Bone Density  Result Date: 09/01/2020 Table formatting from the original result was not included. Date of study: 8/8/222 Exam: DUAL X-RAY ABSORPTIOMETRY (DXA) FOR BONE MINERAL DENSITY (BMD) Instrument: [employer] Requesting Provider: PCP Indication: follow up for low BMD Comparison: 2018 Clinical data: Pt is a 75 y.o. female with Rheumatoid Arthritis Results:  Lumbar spine L1-L4(L3) Femoral neck (FN) T-score  0.1 RFN: -0.9 LFN: -1.4 Change in BMD from previous DXA test (%) Down 5.8%* Down 3.8%* (*) statistically significant Assessment: By the [facility] Criteria for diagnosis based on bone density, this patient has Low Bone Density Z Score compares the patients bone density to age, sex, and race matched controls.  FRAX 10-year fracture risk calculator: 6.8 % for any major fracture and 1.1 % for hip fracture.  Pharmacologic therapy is recommended if 10 year fracture risk is >20% for any major osteoporotic fracture or >3% for hip fracture.   L3 vertebra had to be excluded from analysis due to DJD Comments: the technical quality of the study is good. WHO criteria for diagnosis of osteoporosis in postmenopausal women and in men 34 y/o or older: - normal: T-score -1.0 to + 1.0 - osteopenia/low bone density: T-score between -2.5 and -1.0 - osteoporosis: T-score below -2.5 - severe osteoporosis: T-score below -2.5 with history of fragility fracture Note: although not part of the WHO classification, the presence of a fragility  fracture, regardless of the T-score, should be considered diagnostic of osteoporosis, provided other causes for the fracture have been excluded. RECOMMENDATION: 1. All patients should optimize calcium and vitamin D intake. 2. Consider FDA-approved medical therapies in postmenopausal women and men aged 62 years and older, based on the following: a. A hip or vertebral(clinical or morphometric) fracture. b. T-Score of  -2.5 or less at the femoral neck , total hip or spine after appropriate evaluation to exclude secondary causes c. Low bone mass (T-score between -1.0 and -2.5 at the femoral neck or spine) and a 10 year probability of a hip fracture >3% or a 10 year probability of major osteoporosis-related  fracture > 20% based on the [place]-adapted WHO algorithm d. Clinical judgement and/or patient preferences may indicate treatment for people with 10-year fracture probabilities above or below these levels Follow up BMD is recommended:2 years Interpreted by : Mack Guise, MD [facility]    Recent Labs: Lab Results  Component Value Date   WBC 3.5 (L) 04/23/2020   HGB 12.5 04/23/2020   PLT 224 04/23/2020   NA 140 06/28/2020   K 4.0 06/28/2020   CL 103 06/28/2020   CO2 28 06/28/2020   GLUCOSE 80 06/28/2020   BUN 12 06/28/2020   CREATININE 0.96 06/28/2020   BILITOT 0.5 06/28/2020   ALKPHOS 69 06/28/2020   AST 16 06/28/2020   ALT 15 06/28/2020   PROT 7.9 06/28/2020   ALBUMIN 4.1 06/28/2020   CALCIUM 9.4 06/28/2020   GFRAA 72 04/23/2020   QFTBGOLDPLUS Negative 07/25/2019    Speciality Comments: PLQ EYe Exam: 01/10/2020 WNL @ [facility] [place] up in 6 months  Procedures:  No procedures performed Allergies: Levofloxacin, Nitrofurantoin, Penicillins, Sulfonamide derivatives, and Pantoprazole       Assessment / Plan:     Visit Diagnoses: Sjogren's syndrome with keratoconjunctivitis sicca (HCC) - ANA+, sicca symptoms:  She has not developed any signs or symptoms of a flare  recently.  She has clinically been doing well taking Plaquenil 200 mg 1 tablet by mouth daily Monday through Friday.  She has not missed any doses of Plaquenil recently.  She continues to experience dry mouth especially at night.  We discussed the use of Biotene products.  We also discussed the importance of dental exams every 6 months.  She has an upcoming ophthalmology appointment in December 2022.  She was given a Plaquenil eye exam form to take with her to her next appointment.  Lab work from 04/23/2020 was reviewed with the patient today in the office: ESR 36 (lowest it has been in 3 years), complements within normal limits, and double-stranded DNA negative.  We will obtain the following lab work today.  She will remain on Plaquenil as prescribed.  She was advised to notify us if she develops any new or worsening symptoms.  She will follow-up in the office in 5 months.- Plan: Urinalysis, Routine w reflex microscopic, COMPLETE METABOLIC PANEL WITH GFR, CBC with Differential/Platelet, Sedimentation rate, ANA, Sjogrens syndrome-A extractable nuclear antibody, Sjogrens syndrome-B extractable nuclear antibody, Serum protein electrophoresis with reflex, Rheumatoid factor  High risk medication use - Plaquenil 200 mg 1 tablet by mouth daily Monday to Friday. PLQ Eye Exam: 01/10/2020.  She was given a Plaquenil eye exam form to take with her to her upcoming appointment.  CMP updated on 06/28/2020.  CBC drawn on 04/23/2020.  CBC and CMP will be drawn today to monitor for drug toxicity.- Plan: COMPLETE METABOLIC PANEL WITH GFR, CBC with Differential/Platelet  Primary osteoarthritis of both knees- X-rays were consistent with bilateral moderate osteoarthritis and chondromalacia patella on 04/23/2020.  She was referred to physical therapy after her last office visit which improved her symptoms.  She continues to have intermittent stiffness and discomfort but has no inflammation on examination today.  She continues to have some  lower extremity muscular deconditioning.  We discussed referral for lower extremity muscle strengthening and fall prevention but she declined at this time.  PMR (polymyalgia rheumatica) (HCC) - In remission.  She has not experienced any signs or symptoms of a polymyalgia rheumatica flare.  She is not experiencing any increased myalgias or muscle tenderness.  She  was able to rise from a seated position without assistance.  Both shoulder joints have good range of motion with no discomfort.  She was advised to notify us if she develops signs or symptoms of a flare.  She will remain on Plaquenil as prescribed.  Paresthesia of both hands - Resolved.  Trochanteric bursitis of both hips: She has occasional discomfort on the lateral aspect of both hips.  She had no tenderness over the trochanteric bursa on examination today.  Good hip range of motion with no groin pain noted.  Chronic midline low back pain with right-sided sciatica: Resolved  Osteopenia of multiple sites - DEXA updated on 08/26/20 (ordered by Dr. Quay Burow): LFN T score -1.4.  -3.8% change in BMD from previous DEXA.  She recently started taking calcium and vitamin D supplement as recommended by Dr. Quay Burow.  She has not had any recent falls or fractures.  Her next bone density will be due in August 2024.  Other fatigue: Stable.  Discussed the importance of regular exercise.  Other medical conditions are listed as follows:  History of hypertension  S/P right mastectomy  History of cancer of right breast  History of peripheral neuropathy  History of hyperlipidemia  History of gastroesophageal reflux (GERD)  Orders: Orders Placed This Encounter  Procedures   Urinalysis, Routine w reflex microscopic   COMPLETE METABOLIC PANEL WITH GFR   CBC with Differential/Platelet   Sedimentation rate   ANA   Sjogrens syndrome-A extractable nuclear antibody   Sjogrens syndrome-B extractable nuclear antibody   Serum protein electrophoresis with  reflex   Rheumatoid factor   No orders of the defined types were placed in this encounter.   Follow-Up Instructions: Return in about 5 months (around 02/25/2021) for Sjogren's syndrome, Polymyalgia Rheumatica.   Ofilia Neas, PA-C  Note - This record has been created using Dragon software.  Chart creation errors have been sought, but may not always  have been located. Such creation errors do not reflect on  the standard of medical care.

## 2020-09-17 ENCOUNTER — Telehealth: Payer: Self-pay | Admitting: Pharmacist

## 2020-09-17 NOTE — Progress Notes (Signed)
error 

## 2020-09-25 ENCOUNTER — Encounter: Payer: Self-pay | Admitting: Physician Assistant

## 2020-09-25 ENCOUNTER — Other Ambulatory Visit: Payer: Self-pay

## 2020-09-25 ENCOUNTER — Ambulatory Visit: Payer: PPO | Admitting: Physician Assistant

## 2020-09-25 VITALS — BP 156/88 | HR 54 | Ht 69.0 in | Wt 191.0 lb

## 2020-09-25 DIAGNOSIS — M353 Polymyalgia rheumatica: Secondary | ICD-10-CM

## 2020-09-25 DIAGNOSIS — R202 Paresthesia of skin: Secondary | ICD-10-CM

## 2020-09-25 DIAGNOSIS — Z79899 Other long term (current) drug therapy: Secondary | ICD-10-CM

## 2020-09-25 DIAGNOSIS — M3501 Sicca syndrome with keratoconjunctivitis: Secondary | ICD-10-CM | POA: Diagnosis not present

## 2020-09-25 DIAGNOSIS — G8929 Other chronic pain: Secondary | ICD-10-CM

## 2020-09-25 DIAGNOSIS — M7061 Trochanteric bursitis, right hip: Secondary | ICD-10-CM

## 2020-09-25 DIAGNOSIS — Z9011 Acquired absence of right breast and nipple: Secondary | ICD-10-CM | POA: Diagnosis not present

## 2020-09-25 DIAGNOSIS — Z8679 Personal history of other diseases of the circulatory system: Secondary | ICD-10-CM

## 2020-09-25 DIAGNOSIS — Z853 Personal history of malignant neoplasm of breast: Secondary | ICD-10-CM

## 2020-09-25 DIAGNOSIS — Z8719 Personal history of other diseases of the digestive system: Secondary | ICD-10-CM

## 2020-09-25 DIAGNOSIS — Z8639 Personal history of other endocrine, nutritional and metabolic disease: Secondary | ICD-10-CM

## 2020-09-25 DIAGNOSIS — R5383 Other fatigue: Secondary | ICD-10-CM

## 2020-09-25 DIAGNOSIS — M5441 Lumbago with sciatica, right side: Secondary | ICD-10-CM | POA: Diagnosis not present

## 2020-09-25 DIAGNOSIS — M8589 Other specified disorders of bone density and structure, multiple sites: Secondary | ICD-10-CM | POA: Diagnosis not present

## 2020-09-25 DIAGNOSIS — Z8669 Personal history of other diseases of the nervous system and sense organs: Secondary | ICD-10-CM

## 2020-09-25 DIAGNOSIS — M170 Bilateral primary osteoarthritis of knee: Secondary | ICD-10-CM | POA: Diagnosis not present

## 2020-09-25 DIAGNOSIS — M7062 Trochanteric bursitis, left hip: Secondary | ICD-10-CM

## 2020-09-25 NOTE — Patient Instructions (Addendum)
Knee Exercises Ask your health care provider which exercises are safe for you. Do exercises exactly as told by your health care provider and adjust them as directed. It is normal to feel mild stretching, pulling, tightness, or discomfort as you do these exercises. Stop right away if you feel sudden pain or your pain gets worse. Do not begin these exercises until told by your health care provider. Stretching and range-of-motion exercises These exercises warm up your muscles and joints and improve the movement and flexibility of your knee. These exercises also help to relieve pain and swelling. Knee extension, prone Lie on your abdomen (prone position) on a bed. Place your left / right knee just beyond the edge of the surface so your knee is not on the bed. You can put a towel under your left / right thigh just above your kneecap for comfort. Relax your leg muscles and allow gravity to straighten your knee (extension). You should feel a stretch behind your left / right knee. Hold this position for __________ seconds. Scoot up so your knee is supported between repetitions. Repeat __________ times. Complete this exercise __________ times a day. Knee flexion, active  Lie on your back with both legs straight. If this causes back discomfort, bend your left / right knee so your foot is flat on the floor. Slowly slide your left / right heel back toward your buttocks. Stop when you feel a gentle stretch in the front of your knee or thigh (flexion). Hold this position for __________ seconds. Slowly slide your left / right heel back to the starting position. Repeat __________ times. Complete this exercise __________ times a day. Quadriceps stretch, prone  Lie on your abdomen on a firm surface, such as a bed or padded floor. Bend your left / right knee and hold your ankle. If you cannot reach your ankle or pant leg, loop a belt around your foot and grab the belt instead. Gently pull your heel toward your  buttocks. Your knee should not slide out to the side. You should feel a stretch in the front of your thigh and knee (quadriceps). Hold this position for __________ seconds. Repeat __________ times. Complete this exercise __________ times a day. Hamstring, supine Lie on your back (supine position). Loop a belt or towel over the ball of your left / right foot. The ball of your foot is on the walking surface, right under your toes. Straighten your left / right knee and slowly pull on the belt to raise your leg until you feel a gentle stretch behind your knee (hamstring). Do not let your knee bend while you do this. Keep your other leg flat on the floor. Hold this position for __________ seconds. Repeat __________ times. Complete this exercise __________ times a day. Strengthening exercises These exercises build strength and endurance in your knee. Endurance is the ability to use your muscles for a long time, even after they get tired. Quadriceps, isometric This exercise stretches the muscles in front of your thigh (quadriceps) without moving your knee joint (isometric). Lie on your back with your left / right leg extended and your other knee bent. Put a rolled towel or small pillow under your knee if told by your health care provider. Slowly tense the muscles in the front of your left / right thigh. You should see your kneecap slide up toward your hip or see increased dimpling just above the knee. This motion will push the back of the knee toward the floor. For __________   seconds, hold the muscle as tight as you can without increasing your pain. Relax the muscles slowly and completely. Repeat __________ times. Complete this exercise __________ times a day. Straight leg raises This exercise stretches the muscles in front of your thigh (quadriceps) and the muscles that move your hips (hip flexors). Lie on your back with your left / right leg extended and your other knee bent. Tense the muscles in  the front of your left / right thigh. You should see your kneecap slide up or see increased dimpling just above the knee. Your thigh may even shake a bit. Keep these muscles tight as you raise your leg 4-6 inches (10-15 cm) off the floor. Do not let your knee bend. Hold this position for __________ seconds. Keep these muscles tense as you lower your leg. Relax your muscles slowly and completely after each repetition. Repeat __________ times. Complete this exercise __________ times a day. Hamstring, isometric Lie on your back on a firm surface. Bend your left / right knee about __________ degrees. Dig your left / right heel into the surface as if you are trying to pull it toward your buttocks. Tighten the muscles in the back of your thighs (hamstring) to "dig" as hard as you can without increasing any pain. Hold this position for __________ seconds. Release the tension gradually and allow your muscles to relax completely for __________ seconds after each repetition. Repeat __________ times. Complete this exercise __________ times a day. Hamstring curls If told by your health care provider, do this exercise while wearing ankle weights. Begin with __________ lb weights. Then increase the weight by 1 lb (0.5 kg) increments. Do not wear ankle weights that are more than __________ lb. Lie on your abdomen with your legs straight. Bend your left / right knee as far as you can without feeling pain. Keep your hips flat against the floor. Hold this position for __________ seconds. Slowly lower your leg to the starting position. Repeat __________ times. Complete this exercise __________ times a day. Squats This exercise strengthens the muscles in front of your thigh and knee (quadriceps). Stand in front of a table, with your feet and knees pointing straight ahead. You may rest your hands on the table for balance but not for support. Slowly bend your knees and lower your hips like you are going to sit in a  chair. Keep your weight over your heels, not over your toes. Keep your lower legs upright so they are parallel with the table legs. Do not let your hips go lower than your knees. Do not bend lower than told by your health care provider. If your knee pain increases, do not bend as low. Hold the squat position for __________ seconds. Slowly push with your legs to return to standing. Do not use your hands to pull yourself to standing. Repeat __________ times. Complete this exercise __________ times a day. Wall slides This exercise strengthens the muscles in front of your thigh and knee (quadriceps). Lean your back against a smooth wall or door, and walk your feet out 18-24 inches (46-61 cm) from it. Place your feet hip-width apart. Slowly slide down the wall or door until your knees bend __________ degrees. Keep your knees over your heels, not over your toes. Keep your knees in line with your hips. Hold this position for __________ seconds. Repeat __________ times. Complete this exercise __________ times a day. Straight leg raises This exercise strengthens the muscles that rotate the leg at the hip and   move it away from your body (hip abductors). Lie on your side with your left / right leg in the top position. Lie so your head, shoulder, knee, and hip line up. You may bend your bottom knee to help you keep your balance. Roll your hips slightly forward so your hips are stacked directly over each other and your left / right knee is facing forward. Leading with your heel, lift your top leg 4-6 inches (10-15 cm). You should feel the muscles in your outer hip lifting. Do not let your foot drift forward. Do not let your knee roll toward the ceiling. Hold this position for __________ seconds. Slowly return your leg to the starting position. Let your muscles relax completely after each repetition. Repeat __________ times. Complete this exercise __________ times a day. Straight leg raises This  exercise stretches the muscles that move your hips away from the front of the pelvis (hip extensors). Lie on your abdomen on a firm surface. You can put a pillow under your hips if that is more comfortable. Tense the muscles in your buttocks and lift your left / right leg about 4-6 inches (10-15 cm). Keep your knee straight as you lift your leg. Hold this position for __________ seconds. Slowly lower your leg to the starting position. Let your leg relax completely after each repetition. Repeat __________ times. Complete this exercise __________ times a day. This information is not intended to replace advice given to you by your health care provider. Make sure you discuss any questions you have with your health care provider. Document Revised: 10/26/2017 Document Reviewed: 10/26/2017 Elsevier Patient Education  2022 [employer]  

## 2020-09-27 ENCOUNTER — Telehealth: Payer: Self-pay | Admitting: Pharmacist

## 2020-09-27 NOTE — Progress Notes (Signed)
Chronic Care Management Pharmacy Assistant   Name: Angela Simmons  MRN: [id number] DOB: 17-Nov-1945   Reason for Encounter: Disease State   Conditions to be addressed/monitored: HTN   Recent office visits:  None ID  Recent consult visits:  09/25/20 Ofilia Neas, PA-C-Rheumatology (Sjogren's syndrome and PMR) no med changes  08/21/20 Arnoldo Lenis, MD-Cardiology (AFIB) no med changes  Hospital visits:  None in previous 6 months  Medications: Outpatient Encounter Medications as of 09/27/2020  Medication Sig   atorvastatin (LIPITOR) 10 MG tablet TAKE 1 TABLET BY MOUTH DAILY (Patient not taking: Reported on 09/25/2020)   benazepril (LOTENSIN) 40 MG tablet Take 1 tablet (40 mg total) by mouth daily.   Blood Glucose Monitoring Suppl (ONETOUCH VERIO) w/Device KIT USE TO TEST BLOOD SUGAR ONCE DAILY AS DIRECTED   diclofenac sodium (VOLTAREN) 1 % GEL Apply 3 g to 3 large joints up to 3 times daily.   diltiazem (CARDIZEM CD) 120 MG 24 hr capsule Take 1 capsule (120 mg total) by mouth daily.   ELIQUIS 5 MG TABS tablet TAKE 1 TABLET BY MOUTH TWICE DAILY   fluticasone (FLONASE) 50 MCG/ACT nasal spray USE 2 SPRAYS IN EACH NOSTRIL EVERY DAY   Ginger, Zingiber officinalis, 550 MG CAPS Take 1 capsule (550 mg total) by mouth daily.   glucose blood test strip USE TO TEST BLOOD SUGAR ONCE DAILY. E11.40   hydroxychloroquine (PLAQUENIL) 200 MG tablet TAKE 1 TABLET BY MOUTH ONCE DAILY ON MONDAY-FRIDAY   loratadine (CLARITIN) 10 MG tablet TAKE 1 TABLET BY MOUTH EVERY DAY   metoprolol tartrate (LOPRESSOR) 25 MG tablet TAKE 1 TABLET BY MOUTH TWICE DAILY   Multiple Vitamins-Minerals (CENTRUM SILVER 50+WOMEN) TABS Take 1 tablet by mouth daily at 6 (six) AM.   nitroGLYCERIN (NITROSTAT) 0.4 MG SL tablet Place 1 tablet (0.4 mg total) under the tongue every 5 (five) minutes as needed for chest pain.   Omega-3 Fatty Acids (FISH OIL) 1000 MG CAPS Take 1,000 mg by mouth daily.   omeprazole (PRILOSEC) 40 MG  capsule TAKE ONE CAPSULE BY MOUTH DAILY   ONETOUCH DELICA LANCETS 00F MISC USE TO TEST ONCE DAILY   ONETOUCH VERIO test strip USE TO TEST BLOOD SUGAR ONCE DAILY   Turmeric (QC TUMERIC COMPLEX) 500 MG CAPS Take 1 capsule by mouth daily.   No facility-administered encounter medications on file as of 09/27/2020.    Recent Office Vitals: BP Readings from Last 3 Encounters:  09/25/20 (!) 156/88  08/21/20 140/90  07/15/20 (!) 150/84   Pulse Readings from Last 3 Encounters:  09/25/20 (!) 54  08/21/20 65  07/15/20 66    Wt Readings from Last 3 Encounters:  09/25/20 191 lb (86.6 kg)  08/21/20 190 lb (86.2 kg)  07/15/20 192 lb 9.6 oz (87.4 kg)     Kidney Function Lab Results  Component Value Date/Time   CREATININE 1.12 (H) 09/25/2020 11:11 AM   CREATININE 0.96 06/28/2020 12:48 PM   CREATININE 0.92 04/23/2020 11:44 AM   GFR 58.46 (L) 06/28/2020 12:48 PM   GFRNONAA 62 04/23/2020 11:44 AM   GFRAA 72 04/23/2020 11:44 AM    BMP Latest Ref Rng & Units 09/25/2020 06/28/2020 04/23/2020  Glucose 65 - 99 mg/dL 118(H) 80 125(H)  BUN 7 - 25 mg/dL 13 12 11   Creatinine 0.60 - 1.00 mg/dL 1.12(H) 0.96 0.92  BUN/Creat Ratio 6 - 22 (calc) 12 - NOT APPLICABLE  Sodium 121 - 146 mmol/L 141 140 139  Potassium 3.5 -  5.3 mmol/L 4.2 4.0 4.2  Chloride 98 - 110 mmol/L 106 103 104  CO2 20 - 32 mmol/L 28 28 29   Calcium 8.6 - 10.4 mg/dL 9.1 9.4 9.4     Contacted patient on 09/27/20 to discuss hypertension disease state  Current antihypertensive regimen:  Benazepril 40 mg  Metoprolol tartrate 25 mg   Patient verbally confirms she is taking the above medications as directed. Yes  How often are you checking your Blood Pressure? daily  she checks her blood pressure in the afternoon after taking her medication.  Current home BP readings: 134/83 this morning, patient states she does not keep a log of readings  Wrist or arm cuff:arm cuff Caffeine intake:sometimes she drinks coffee in the morning Salt  intake:very little salt on food OTC medications including pseudoephedrine or NSAIDs?  Any readings above 180/120? No  What recent interventions/DTPs have been made by any provider to improve Blood Pressure control since last CPP Visit: none noted  Any recent hospitalizations or ED visits since last visit with CPP? No  What diet changes have been made to improve Blood Pressure Control?  Patient states that she watches what she eats  What exercise is being done to improve your Blood Pressure Control?  Patient states that she does not exercise  Adherence Review: Is the patient currently on ACE/ARB medication? Yes Does the patient have >5 day gap between last estimated fill dates? No   Star Rating Drugs:  Medication:  Last Fill: Day Supply Benazepril 40 mg  08/14/20 90 Atorvastatin 10 mg 08/27/20  90  Care Gaps: Annual wellness visit in last year? No Most Recent BP reading:134/83 this morning  If Diabetic: Most recent A1C reading:6.3 06/28/20 Last eye exam / retinopathy screening:01/10/20 Last diabetic foot exam:06/21/19   PCP appointment on 01/03/21    [facility] Pharmacist Assistant [phone]   Time spent:33

## 2020-09-30 LAB — CBC WITH DIFFERENTIAL/PLATELET
Absolute Monocytes: 231 cells/uL (ref 200–950)
Basophils Absolute: 30 cells/uL (ref 0–200)
Basophils Relative: 0.9 %
Eosinophils Absolute: 30 cells/uL (ref 15–500)
Eosinophils Relative: 0.9 %
HCT: 37.5 % (ref 35.0–45.0)
Hemoglobin: 12.3 g/dL (ref 11.7–15.5)
Lymphs Abs: 927 cells/uL (ref 850–3900)
MCH: 30.2 pg (ref 27.0–33.0)
MCHC: 32.8 g/dL (ref 32.0–36.0)
MCV: 92.1 fL (ref 80.0–100.0)
MPV: 11.3 fL (ref 7.5–12.5)
Monocytes Relative: 7 %
Neutro Abs: 2082 cells/uL (ref 1500–7800)
Neutrophils Relative %: 63.1 %
Platelets: 207 10*3/uL (ref 140–400)
RBC: 4.07 10*6/uL (ref 3.80–5.10)
RDW: 13 % (ref 11.0–15.0)
Total Lymphocyte: 28.1 %
WBC: 3.3 10*3/uL — ABNORMAL LOW (ref 3.8–10.8)

## 2020-09-30 LAB — URINALYSIS, ROUTINE W REFLEX MICROSCOPIC
Bacteria, UA: NONE SEEN /HPF
Bilirubin Urine: NEGATIVE
Glucose, UA: NEGATIVE
Hgb urine dipstick: NEGATIVE
Hyaline Cast: NONE SEEN /LPF
Ketones, ur: NEGATIVE
Nitrite: NEGATIVE
Protein, ur: NEGATIVE
RBC / HPF: NONE SEEN /HPF (ref 0–2)
Specific Gravity, Urine: 1.015 (ref 1.001–1.035)
pH: 7 (ref 5.0–8.0)

## 2020-09-30 LAB — COMPLETE METABOLIC PANEL WITH GFR
AG Ratio: 1.2 (calc) (ref 1.0–2.5)
ALT: 11 U/L (ref 6–29)
AST: 15 U/L (ref 10–35)
Albumin: 3.8 g/dL (ref 3.6–5.1)
Alkaline phosphatase (APISO): 64 U/L (ref 37–153)
BUN/Creatinine Ratio: 12 (calc) (ref 6–22)
BUN: 13 mg/dL (ref 7–25)
CO2: 28 mmol/L (ref 20–32)
Calcium: 9.1 mg/dL (ref 8.6–10.4)
Chloride: 106 mmol/L (ref 98–110)
Creat: 1.12 mg/dL — ABNORMAL HIGH (ref 0.60–1.00)
Globulin: 3.3 g/dL (calc) (ref 1.9–3.7)
Glucose, Bld: 118 mg/dL — ABNORMAL HIGH (ref 65–99)
Potassium: 4.2 mmol/L (ref 3.5–5.3)
Sodium: 141 mmol/L (ref 135–146)
Total Bilirubin: 0.4 mg/dL (ref 0.2–1.2)
Total Protein: 7.1 g/dL (ref 6.1–8.1)
eGFR: 52 mL/min/{1.73_m2} — ABNORMAL LOW (ref >=60)

## 2020-09-30 LAB — PROTEIN ELECTROPHORESIS, SERUM, WITH REFLEX
Albumin ELP: 3.7 g/dL — ABNORMAL LOW (ref 3.8–4.8)
Alpha 1: 0.3 g/dL (ref 0.2–0.3)
Alpha 2: 0.8 g/dL (ref 0.5–0.9)
Beta 2: 0.8 g/dL — ABNORMAL HIGH (ref 0.2–0.5)
Beta Globulin: 0.5 g/dL (ref 0.4–0.6)
Gamma Globulin: 1.1 g/dL (ref 0.8–1.7)
Total Protein: 7.2 g/dL (ref 6.1–8.1)

## 2020-09-30 LAB — MICROSCOPIC MESSAGE

## 2020-09-30 LAB — RHEUMATOID FACTOR: Rheumatoid fact SerPl-aCnc: <14 IU/mL (ref <14)

## 2020-09-30 LAB — SEDIMENTATION RATE: Sed Rate: 39 mm/h — ABNORMAL HIGH (ref 0–30)

## 2020-09-30 LAB — ANA: Anti Nuclear Antibody (ANA): POSITIVE — AB

## 2020-09-30 LAB — ANTI-NUCLEAR AB-TITER (ANA TITER): ANA Titer 1: 1:640 {titer} — ABNORMAL HIGH

## 2020-09-30 LAB — SJOGRENS SYNDROME-A EXTRACTABLE NUCLEAR ANTIBODY: SSA (Ro) (ENA) Antibody, IgG: 7.3 AI — AB

## 2020-09-30 LAB — SJOGRENS SYNDROME-B EXTRACTABLE NUCLEAR ANTIBODY: SSB (La) (ENA) Antibody, IgG: <1 AI

## 2020-09-30 NOTE — Progress Notes (Signed)
Please advise the patient to return in 1 month to recheck CBC with diff and please add in hydroxychloroquine blood level.

## 2020-09-30 NOTE — Progress Notes (Signed)
WBC count remains low.  Rest of CBC WNL.  Creatinine is borderline elevated-1.12 and GFR is slightly low-52. She should avoid NSAIDs. Glucose is 118. Rest of CMP WNL.  ESR remains slightly elevated but stable.   Ro antibody and ANA remain positive. RF negative and La antibody negative.  SPEP did not reveal any abnormal proteins.   UA revealed trace leukocytes.  Negative for nitrites and bacteria.

## 2020-10-01 ENCOUNTER — Telehealth: Payer: Self-pay | Admitting: *Deleted

## 2020-10-01 DIAGNOSIS — M3501 Sicca syndrome with keratoconjunctivitis: Secondary | ICD-10-CM

## 2020-10-01 DIAGNOSIS — Z79899 Other long term (current) drug therapy: Secondary | ICD-10-CM

## 2020-10-01 DIAGNOSIS — M353 Polymyalgia rheumatica: Secondary | ICD-10-CM

## 2020-10-01 NOTE — Telephone Encounter (Signed)
-----   Message from Ofilia Neas, PA-C sent at 09/30/2020  2:31 PM EDT ----- Please advise the patient to return in 1 month to recheck CBC with diff and please add in hydroxychloroquine blood level.

## 2020-10-07 ENCOUNTER — Telehealth: Payer: Self-pay

## 2020-10-07 NOTE — Telephone Encounter (Signed)
I have reviewed them.  Not sure if she needs a call back or if she had other questions, but I did review them.

## 2020-10-07 NOTE — Telephone Encounter (Signed)
Had labs done last week under another provider and we like Dr. Quay Burow to review them.

## 2020-10-07 NOTE — Telephone Encounter (Signed)
Please advise as that has stated she wanted Dr. Quay Burow to look at her labs and let her know if everything was ok, does she need to change anything and what should her next steps be?

## 2020-10-08 NOTE — Telephone Encounter (Signed)
Nothing further needs to be done right now.  She should follow the instructions given by the rheumatologist-I think she has a follow-up with them in a month or so and they will make changes if needed and review her blood work.

## 2020-10-09 NOTE — Telephone Encounter (Signed)
Spoke with patient today. 

## 2020-10-21 ENCOUNTER — Other Ambulatory Visit: Payer: Self-pay | Admitting: Rheumatology

## 2020-10-21 NOTE — Telephone Encounter (Signed)
Next Visit: 02/28/2021  Last Visit: 09/25/2020  Labs: 09/25/2020 WBC count remains low.  Rest of CBC WNL.  Creatinine is borderline elevated-1.12 and GFR is slightly low-52. Glucose is 118. Rest of CMP WNL  Eye exam: 01/10/2020 WNL    Current Dose per office note 09/25/2020: Plaquenil 200 mg 1 tablet by mouth daily Monday to Friday  [id number] syndrome with keratoconjunctivitis sicca   Last Fill: 07/15/2020   Okay to refill Plaquenil?

## 2020-10-22 ENCOUNTER — Telehealth: Payer: Self-pay | Admitting: Pharmacist

## 2020-10-22 NOTE — Progress Notes (Signed)
Chronic Care Management Pharmacy Assistant   Name: Angela Simmons  MRN: [id number] DOB: 15-Feb-1945   Reason for Encounter: Disease State   Conditions to be addressed/monitored: General    Recent office visits:  None ID  Recent consult visits:  None ID  Hospital visits:  None in previous 6 months  Medications: Outpatient Encounter Medications as of 10/22/2020  Medication Sig   atorvastatin (LIPITOR) 10 MG tablet TAKE 1 TABLET BY MOUTH DAILY (Patient not taking: Reported on 09/25/2020)   benazepril (LOTENSIN) 40 MG tablet Take 1 tablet (40 mg total) by mouth daily.   Blood Glucose Monitoring Suppl (ONETOUCH VERIO) w/Device KIT USE TO TEST BLOOD SUGAR ONCE DAILY AS DIRECTED   diclofenac sodium (VOLTAREN) 1 % GEL Apply 3 g to 3 large joints up to 3 times daily.   diltiazem (CARDIZEM CD) 120 MG 24 hr capsule Take 1 capsule (120 mg total) by mouth daily.   ELIQUIS 5 MG TABS tablet TAKE 1 TABLET BY MOUTH TWICE DAILY   fluticasone (FLONASE) 50 MCG/ACT nasal spray USE 2 SPRAYS IN EACH NOSTRIL EVERY DAY   Ginger, Zingiber officinalis, 550 MG CAPS Take 1 capsule (550 mg total) by mouth daily.   glucose blood test strip USE TO TEST BLOOD SUGAR ONCE DAILY. E11.40   hydroxychloroquine (PLAQUENIL) 200 MG tablet TAKE 1 TABLET BY MOUTH ONCE DAILY ON MONDAY-FRIDAY   loratadine (CLARITIN) 10 MG tablet TAKE 1 TABLET BY MOUTH EVERY DAY   metoprolol tartrate (LOPRESSOR) 25 MG tablet TAKE 1 TABLET BY MOUTH TWICE DAILY   Multiple Vitamins-Minerals (CENTRUM SILVER 50+WOMEN) TABS Take 1 tablet by mouth daily at 6 (six) AM.   nitroGLYCERIN (NITROSTAT) 0.4 MG SL tablet Place 1 tablet (0.4 mg total) under the tongue every 5 (five) minutes as needed for chest pain.   Omega-3 Fatty Acids (FISH OIL) 1000 MG CAPS Take 1,000 mg by mouth daily.   omeprazole (PRILOSEC) 40 MG capsule TAKE ONE CAPSULE BY MOUTH DAILY   ONETOUCH DELICA LANCETS 83G MISC USE TO TEST ONCE DAILY   ONETOUCH VERIO test strip USE TO TEST  BLOOD SUGAR ONCE DAILY   Turmeric (QC TUMERIC COMPLEX) 500 MG CAPS Take 1 capsule by mouth daily.   No facility-administered encounter medications on file as of 10/22/2020.    Contacted Angela Simmons on 10/22/20 for general disease state and medication adherence call.   Patient is > 5 days past due for refill on the following medications per chart history:  [facility] Medications: Medication Name/mg Last Fill Days Supply Atorvastatin 10 mg  10/03/20 30 Benazepril 40 mg  07/2720  90    What concerns do you have about your medications?Patient states that she would like to apply for Eliquis patient assistance  The patient denies side effects with her medications.   How often do you forget or accidentally miss a dose? Rarely  Do you use a pillbox? Yes  Are you having any problems getting your medications from your pharmacy? No  Has the cost of your medications been a concern? Yes If yes, what medication and is patient assistance available or has it been applied for? Patient states that Eliquis is costing her $45 a month  Since last visit with CPP, no interventions have been made:   The patient has not had an ED visit since last contact.   The patient denies problems with their health.   she denies  concerns or questions for [employer], Pharm. D at this time.  Care Gaps: Annual wellness visit in last year? No Most Recent BP reading and date:  If Diabetic: Most recent A1C reading:6.3, 06/28/20 Last eye exam / retinopathy screening:06/21/19 Last diabetic foot exam:01/10/20  PCP appointment on 01/03/21    Tracy Ellis,CMA Clinical Pharmacist Assistant [phone]  

## 2020-10-24 ENCOUNTER — Telehealth: Payer: Self-pay | Admitting: Internal Medicine

## 2020-10-24 ENCOUNTER — Telehealth: Payer: Self-pay

## 2020-10-24 DIAGNOSIS — Z20822 Contact with and (suspected) exposure to covid-19: Secondary | ICD-10-CM | POA: Diagnosis not present

## 2020-10-24 NOTE — Telephone Encounter (Signed)
Patient daughter Angela Simmons calling in  Requesting call back from nurse  Would not disclose what call back was in regards too  Please call [phone]

## 2020-10-24 NOTE — Telephone Encounter (Signed)
Patient called stating she was planning to come in the office tomorrow 10/25/20 to have labwork.  Patient states her husband was diagnosed with Covid and they are in quarantine.  She states she will come one day next week.

## 2020-10-24 NOTE — Telephone Encounter (Signed)
Noted  

## 2020-10-25 ENCOUNTER — Telehealth (INDEPENDENT_AMBULATORY_CARE_PROVIDER_SITE_OTHER): Payer: PPO | Admitting: Nurse Practitioner

## 2020-10-25 ENCOUNTER — Encounter: Payer: Self-pay | Admitting: Nurse Practitioner

## 2020-10-25 VITALS — BP 137/87

## 2020-10-25 DIAGNOSIS — U071 COVID-19: Secondary | ICD-10-CM | POA: Diagnosis not present

## 2020-10-25 MED ORDER — MOLNUPIRAVIR EUA 200MG CAPSULE: 4.0000 | ORAL_CAPSULE | Freq: Two times a day (BID) | ORAL | 0 refills | Status: AC

## 2020-10-25 NOTE — Progress Notes (Signed)
Due to national recommendations of social distancing related to the [place] pandemic, an audio-only tele-health visit was felt to be the most appropriate encounter type for this patient today. I connected with  RAMI WADDLE on 10/25/20 utilizing audio-only technology and verified that I am speaking with the correct person using two identifiers. The patient was located at their home, and I was located at the office of Register at [facility] during the encounter. I discussed the limitations of evaluation and management by telemedicine. The patient expressed understanding and agreed to proceed.    Subjective:  Patient ID: Angela Simmons, female    DOB: [birth year]  Age: 75 y.o. MRN: [id number]  CC:  Chief Complaint  Patient presents with   covid 84       HPI  This patient arrives today for the above.  Tested positive for covid yesterday. She tells me her symptoms also started yesterday.  She is experiencing headache, dizziness, productive cough, Congestion, fever, fatigue, and mild shortness of breath.  She has been vaccinated against COVID-19.  She was exposed by her husband who tested positive as well.  Past Medical History:  Diagnosis Date   Breast cancer ([place]) 1990s   right breast   Collagen vascular disease ([place])    Diabetes mellitus without complication ([place])    GERD (gastroesophageal reflux disease)    HTN (hypertension)    Hypercholesterolemia    Obesity    Osteoarthritis       Family History  Problem Relation Age of Onset   Diabetes Mother        diabetic coma   Heart failure Mother    Diabetes Father    Emphysema Father    Diabetes Sister    Hypercholesterolemia Sister    Hypercholesterolemia Brother    Diabetes Brother    Hypertension Brother    Hypercholesterolemia Brother    Diabetes Sister    Hypertension Sister    Hypercholesterolemia Sister    Hypercholesterolemia Sister    Hypercholesterolemia Sister    Diabetes Brother     Hypertension Brother    Colon cancer Neg Hx     Social History   Social History Narrative   Lives at home with husband   Caffeine, maybe 1 a day   Social History   Tobacco Use   Smoking status: Never   Smokeless tobacco: Never  Substance Use Topics   Alcohol use: No     Current Meds  Medication Sig   benazepril (LOTENSIN) 40 MG tablet Take 1 tablet (40 mg total) by mouth daily.   Blood Glucose Monitoring Suppl (ONETOUCH VERIO) w/Device KIT USE TO TEST BLOOD SUGAR ONCE DAILY AS DIRECTED   diclofenac sodium (VOLTAREN) 1 % GEL Apply 3 g to 3 large joints up to 3 times daily.   ELIQUIS 5 MG TABS tablet TAKE 1 TABLET BY MOUTH TWICE DAILY   fluticasone (FLONASE) 50 MCG/ACT nasal spray USE 2 SPRAYS IN EACH NOSTRIL EVERY DAY   Ginger, Zingiber officinalis, 550 MG CAPS Take 1 capsule (550 mg total) by mouth daily.   hydroxychloroquine (PLAQUENIL) 200 MG tablet TAKE 1 TABLET BY MOUTH ONCE DAILY ON MONDAY-FRIDAY   loratadine (CLARITIN) 10 MG tablet TAKE 1 TABLET BY MOUTH EVERY DAY   metoprolol tartrate (LOPRESSOR) 25 MG tablet TAKE 1 TABLET BY MOUTH TWICE DAILY   molnupiravir EUA (LAGEVRIO) 200 mg CAPS capsule Take 4 capsules (800 mg total) by mouth 2 (two) times daily for 5 days.  Multiple Vitamins-Minerals (CENTRUM SILVER 50+WOMEN) TABS Take 1 tablet by mouth daily at 6 (six) AM.   Omega-3 Fatty Acids (FISH OIL) 1000 MG CAPS Take 1,000 mg by mouth daily.   omeprazole (PRILOSEC) 40 MG capsule TAKE ONE CAPSULE BY MOUTH DAILY   Turmeric (QC TUMERIC COMPLEX) 500 MG CAPS Take 1 capsule by mouth daily.    ROS:  Review of Systems  Constitutional:  Positive for fever and malaise/fatigue.  HENT:  Positive for congestion.   Respiratory:  Positive for cough, sputum production (clear mucus) and shortness of breath.   Cardiovascular:  Negative for chest pain.  Gastrointestinal:  Negative for diarrhea.  Neurological:  Positive for dizziness and headaches.    Objective:   Today's Vitals:  BP 137/87  Vitals with BMI 10/25/2020 09/25/2020 08/21/2020  Height - _0  _1   Weight - 191 lbs 190 lbs  BMI - 98.47 30.85  Systolic 694 370 052  Diastolic 87 88 90  Pulse - 54 65     Physical Exam Comprehensive physical exam not completed today as office visit was conducted remotely.  Patient sounded well over the phone.  She did not appear to be in any acute distress.  She was able to speak in complete sentences without having to stop to breathe.  Patient was alert and oriented, and appeared to have appropriate judgment.       Assessment and Plan   1. COVID-19      Plan: 1.  We will treat with molnupiravir as her last blood work shows GFR less than 60 and she is at high risk for significant and/or severe disease.  We did discuss how to take medication.  I also told her if symptoms worsen over the weekend that she needs to go to the emergency department and she endorses her understanding.  I recommend she continue taking her Flonase nasal spray as well as her Claritin.  I also recommended she consider over-the-counter Mucinex as needed for cough.  She tells me she understands.  We did discuss quarantine precautions and that she should quarantine for at least 5 days since symptom onset, and if she is feeling better at that time that she can go back out in the community but needs to wear a mask for an additional 5 days.  She tells me she understands.   Tests ordered No orders of the defined types were placed in this encounter.     Meds ordered this encounter  Medications   molnupiravir EUA (LAGEVRIO) 200 mg CAPS capsule    Sig: Take 4 capsules (800 mg total) by mouth 2 (two) times daily for 5 days.    Dispense:  40 capsule    Refill:  0    Order Specific Question:   Supervising Provider    Answer:   Binnie Rail [id number]     Patient to follow-up as needed.  Total time spent on telephone today was 12 minutes and 37 seconds.  Angela Ards, NP

## 2020-10-25 NOTE — Telephone Encounter (Signed)
Spoke with daughter today.  Appointment made with Judson Roch today.

## 2020-10-25 NOTE — Telephone Encounter (Signed)
Patient daughter calling in again  Please see previous message below

## 2020-11-01 NOTE — Progress Notes (Signed)
   Spoke with patient about applying for patient assistance to help with medication for Eliquis. Patient states that she does not believe she will qualify, she will continue to pay for it herself.  [place] Pharmacist Assistant [phone]

## 2020-11-11 ENCOUNTER — Other Ambulatory Visit: Payer: Self-pay | Admitting: *Deleted

## 2020-11-11 ENCOUNTER — Telehealth: Payer: Self-pay | Admitting: Cardiology

## 2020-11-11 DIAGNOSIS — M3501 Sicca syndrome with keratoconjunctivitis: Secondary | ICD-10-CM

## 2020-11-11 DIAGNOSIS — M353 Polymyalgia rheumatica: Secondary | ICD-10-CM

## 2020-11-11 DIAGNOSIS — Z79899 Other long term (current) drug therapy: Secondary | ICD-10-CM

## 2020-11-11 NOTE — Telephone Encounter (Signed)
Pt c/o medication issue:  1. Name of Medication:  ELIQUIS 5 MG TABS tablet  2. How are you currently taking this medication (dosage and times per day)?  As prescribed   3. Are you having a reaction (difficulty breathing--STAT)?  No   4. What is your medication issue?   Patient states she is in the donut hole and she will run out of medication this week. She would like to know if she can be put on an alternative.

## 2020-11-11 NOTE — Telephone Encounter (Signed)
Advised that eliquis samples are available for pick up after approval by anti-coag team. Also gave number to call; [employer] ([phone]) to request assistance. Verbalized understanding.

## 2020-11-12 MED ORDER — APIXABAN 5 MG PO TABS: 5.0000 mg | ORAL_TABLET | Freq: Two times a day (BID) | ORAL | 0 refills | Status: DC

## 2020-11-12 NOTE — Telephone Encounter (Signed)
Sample request for Eliquis received. Indication: aflutter Last office visit: [place], 08/21/2020 Scr: 1.12, 09/25/2020 Age: 75 yo  Weight: 86.6 kg   Pt on the correct dose of Eliquis 5mg  BID.

## 2020-11-12 NOTE — Progress Notes (Signed)
White cell count is low and stable

## 2020-11-14 DIAGNOSIS — E1142 Type 2 diabetes mellitus with diabetic polyneuropathy: Secondary | ICD-10-CM | POA: Diagnosis not present

## 2020-11-14 DIAGNOSIS — B351 Tinea unguium: Secondary | ICD-10-CM | POA: Diagnosis not present

## 2020-11-14 DIAGNOSIS — M79673 Pain in unspecified foot: Secondary | ICD-10-CM | POA: Diagnosis not present

## 2020-11-14 DIAGNOSIS — L84 Corns and callosities: Secondary | ICD-10-CM | POA: Diagnosis not present

## 2020-11-19 DIAGNOSIS — E119 Type 2 diabetes mellitus without complications: Secondary | ICD-10-CM | POA: Diagnosis not present

## 2020-11-19 DIAGNOSIS — H40012 Open angle with borderline findings, low risk, left eye: Secondary | ICD-10-CM | POA: Diagnosis not present

## 2020-11-19 DIAGNOSIS — H40021 Open angle with borderline findings, high risk, right eye: Secondary | ICD-10-CM | POA: Diagnosis not present

## 2020-11-21 ENCOUNTER — Other Ambulatory Visit: Payer: Self-pay | Admitting: *Deleted

## 2020-11-21 LAB — CBC WITH DIFFERENTIAL/PLATELET
Absolute Monocytes: 211 cells/uL (ref 200–950)
Basophils Absolute: 20 cells/uL (ref 0–200)
Basophils Relative: 0.6 %
Eosinophils Absolute: 20 cells/uL (ref 15–500)
Eosinophils Relative: 0.6 %
HCT: 36.7 % (ref 35.0–45.0)
Hemoglobin: 12 g/dL (ref 11.7–15.5)
Lymphs Abs: 1142 cells/uL (ref 850–3900)
MCH: 30.3 pg (ref 27.0–33.0)
MCHC: 32.7 g/dL (ref 32.0–36.0)
MCV: 92.7 fL (ref 80.0–100.0)
MPV: 10.9 fL (ref 7.5–12.5)
Monocytes Relative: 6.2 %
Neutro Abs: 2006 cells/uL (ref 1500–7800)
Neutrophils Relative %: 59 %
Platelets: 205 10*3/uL (ref 140–400)
RBC: 3.96 10*6/uL (ref 3.80–5.10)
RDW: 13 % (ref 11.0–15.0)
Total Lymphocyte: 33.6 %
WBC: 3.4 10*3/uL — ABNORMAL LOW (ref 3.8–10.8)

## 2020-11-21 LAB — HYDROXYCHLOROQUINE,BLOOD: HYDROXYCHLOROQUINE, (B): 360 ng/mL

## 2020-11-21 MED ORDER — HYDROXYCHLOROQUINE SULFATE 200 MG PO TABS: 200.0000 mg | ORAL_TABLET | Freq: Every day | ORAL | 0 refills | Status: DC

## 2020-11-21 NOTE — Telephone Encounter (Signed)
-----   Message from Bo Merino, MD sent at 11/21/2020  8:12 AM EDT ----- Hydroxychloroquine is low and not therapeutic.  She was clinically doing well at the last visit without any flares.  Please advise patient to take hydroxychloroquine 200 mg p.o. daily.

## 2020-11-21 NOTE — Progress Notes (Signed)
Hydroxychloroquine is low and not therapeutic.  She was clinically doing well at the last visit without any flares.  Please advise patient to take hydroxychloroquine 200 mg p.o. daily.

## 2020-12-09 ENCOUNTER — Telehealth: Payer: Self-pay | Admitting: Rheumatology

## 2020-12-09 NOTE — Telephone Encounter (Signed)
Patient states she was advised to take hydroxychloroquine 200 mg p.o. daily. Patient wanted to know what we were looking for by increasing it. Patient advised with her labs on 11/11/2020 her Hydroxychloroquine level is low and not therapeutic. Patient advised that Dr. Estanislado Pandy will recheck this level after she has been on the increased dose for a while. Patient expressed understanding.

## 2020-12-09 NOTE — Telephone Encounter (Signed)
Patient calling in reference to Plaquenil. Please call to discuss.

## 2020-12-20 ENCOUNTER — Telehealth: Payer: Self-pay | Admitting: Cardiology

## 2020-12-20 MED ORDER — APIXABAN 5 MG PO TABS: 5.0000 mg | ORAL_TABLET | Freq: Two times a day (BID) | ORAL | 0 refills | Status: DC

## 2020-12-20 NOTE — Telephone Encounter (Signed)
Samples provides as she is working on her patient assistance application.

## 2020-12-20 NOTE — Telephone Encounter (Signed)
Patient calling the office for samples of medication:   PATIENT WALKED INTO THE OFFICE   1.  What medication and dosage are you requesting samples for?  ELIQUIS 5 MG  2.  Are you currently out of this medication?  NO

## 2021-01-02 ENCOUNTER — Encounter: Payer: Self-pay | Admitting: Internal Medicine

## 2021-01-02 NOTE — Progress Notes (Signed)
Subjective:    Patient ID: Angela Simmons, female    DOB: 21-Dec-1945, 75 y.o.   MRN: [id number]  This visit occurred during the SARS-CoV-2 public health emergency.  Safety protocols were in place, including screening questions prior to the visit, additional usage of staff PPE, and extensive cleaning of exam room while observing appropriate contact time as indicated for disinfecting solutions.     HPI The patient is here for follow up of their chronic medical problems, including htn, hld, DM, gerd   She walks some. Her BP and sugars are well controlled at home.    Burning /tingling in right lateral abdomen.  It is intermittent - not related to activity.  She thinks she really only notices it when she is sitting.  Right knee pain she has chronic bilateral knee osteoarthritis.  She stepped wrong in her right knee and has had increased pain.  It has gotten slightly better, but still painful.  Medications and allergies reviewed with patient and updated if appropriate.  Patient Active Problem List   Diagnosis Date Noted   Atrial flutter ([place]) 08/22/2020   Aortic atherosclerosis ([place]) 06/28/2020   Secondary hypercoagulable state ([place]) 12/26/2019   Anticoagulated 12/05/2019   PAF (paroxysmal atrial fibrillation) ([place]) 08/04/2019   Thrush 08/02/2019   Community acquired pneumonia 07/22/2019   DOE (dyspnea on exertion) 04/03/2019   Ankle swelling, left 04/03/2019   Hyperpigmented skin lesion 04/03/2019   Poor balance 12/06/2018   Headache 12/06/2018   Vertigo 06/07/2018   Urinary frequency 06/07/2018   Lower abdominal pain 06/07/2018   Herpes zoster without complication 67/67/2094   Tingling in extremities 11/26/2017   Ischial bursitis of left side 06/02/2017   Sicca syndrome ([place]) 06/23/2016   Bilateral leg edema 04/07/2016   Burning sensation of skin 04/07/2016   PMR (polymyalgia rheumatica) ([place]) 03/27/2016   Osteopenia 03/04/2016   Autoimmune disease ([place]) 03/18/2015    Inflammatory arthritis 03/18/2015   Joint pain 11/22/2014   Overweight (BMI 25.0-29.9) 08/18/2014   Low back pain with radiation 08/18/2014   Hyperlipidemia 06/28/2014   Chest pain with moderate risk of acute coronary syndrome 04/11/2014   Tendonitis of shoulder 08/29/2013   S/P right mastectomy 11/14/2012   Type 2 diabetes, controlled, with neuropathy ([place]) 10/05/2012   Allergic rhinitis 01/26/2011   GERD 11/10/2009   INTERSTITIAL CYSTITIS 10/31/2008   BENIGN NEOPLASM OF VULVA 02/01/2008   Essential hypertension, benign 02/08/2007   History of cancer of right breast 02/08/2007    Current Outpatient Medications on File Prior to Visit  Medication Sig Dispense Refill   hydroxychloroquine (PLAQUENIL) 200 MG tablet Take 1 tablet (200 mg total) by mouth daily. 90 tablet 0   apixaban (ELIQUIS) 5 MG TABS tablet Take 1 tablet (5 mg total) by mouth 2 (two) times daily. 42 tablet 0   benazepril (LOTENSIN) 40 MG tablet Take 1 tablet (40 mg total) by mouth daily. 90 tablet 0   Blood Glucose Monitoring Suppl (ONETOUCH VERIO) w/Device KIT USE TO TEST BLOOD SUGAR ONCE DAILY AS DIRECTED 1 kit 0   diclofenac sodium (VOLTAREN) 1 % GEL Apply 3 g to 3 large joints up to 3 times daily. 3 Tube 3   diltiazem (CARDIZEM CD) 120 MG 24 hr capsule Take 1 capsule (120 mg total) by mouth daily. 90 capsule 3   fluticasone (FLONASE) 50 MCG/ACT nasal spray USE 2 SPRAYS IN EACH NOSTRIL EVERY DAY 48 g 0   Ginger, Zingiber officinalis, 550 MG CAPS Take  1 capsule (550 mg total) by mouth daily. 60 capsule 0   glucose blood test strip USE TO TEST BLOOD SUGAR ONCE DAILY. E11.40 100 each 3   loratadine (CLARITIN) 10 MG tablet TAKE 1 TABLET BY MOUTH EVERY DAY 30 tablet 2   metoprolol tartrate (LOPRESSOR) 25 MG tablet TAKE 1 TABLET BY MOUTH TWICE DAILY 60 tablet 6   Multiple Vitamins-Minerals (CENTRUM SILVER 50+WOMEN) TABS Take 1 tablet by mouth daily at 6 (six) AM.     nitroGLYCERIN (NITROSTAT) 0.4 MG SL tablet Place 1 tablet  (0.4 mg total) under the tongue every 5 (five) minutes as needed for chest pain. 25 tablet 3   Omega-3 Fatty Acids (FISH OIL) 1000 MG CAPS Take 1,000 mg by mouth daily.     omeprazole (PRILOSEC) 40 MG capsule TAKE ONE CAPSULE BY MOUTH DAILY 90 capsule 3   ONETOUCH DELICA LANCETS 94W MISC USE TO TEST ONCE DAILY 100 each 2   ONETOUCH VERIO test strip USE TO TEST BLOOD SUGAR ONCE DAILY 100 each 0   Turmeric (QC TUMERIC COMPLEX) 500 MG CAPS Take 1 capsule by mouth daily.     No current facility-administered medications on file prior to visit.    Past Medical History:  Diagnosis Date   Breast cancer [facility]) 1990s   right breast   Collagen vascular disease ([place])    Diabetes mellitus without complication ([place])    GERD (gastroesophageal reflux disease)    HTN (hypertension)    Hypercholesterolemia    Obesity    Osteoarthritis     Past Surgical History:  Procedure Laterality Date   ABDOMINAL HY[place]ERECTOMY  1993   [place], fib[place]   AORTIC ARCH ANGIOGRAPHY N/A 12/07/2019   Procedure: AORTIC ARCH ANGIOGRAPHY;  Surgeon: Lorretta Harp, MD;  Location: [facility];  Service: Cardiovascular;  Laterality: N/A;   COLONOSCOPY  May 2003   Dr. Tamala Julian: normal   COLONOSCOPY  06/22/2011   Procedure: COLONOSCOPY;  Surgeon: Danie Binder, MD;  [facility];  Service: Endoscopy;  Laterality: N/A;  10:30   LEFT HEART CATH AND CORONARY ANGIOGRAPHY N/A 12/07/2019   Procedure: LEFT HEART CATH AND CORONARY ANGIOGRAPHY;  Surgeon: Lorretta Harp, MD;  Location: [facility];  Service: Cardiovascular;  Laterality: N/A;   MASTECTOMY  1995   right     Social History   Socioeconomic History   Marital status: Married    Spouse name: Not on file   Number of children: 3   Years of education: 7   Highest education level: Not on file  Occupational History   Not on file  Tobacco Use   Smoking status: Never   Smokeless tobacco: Never  Vaping Use   Vaping Use: Never used  Substance and  Sexual Activity   Alcohol use: No   Drug use: Never   Sexual activity: Never    Birth control/protection: Rhythm  Other Topics Concern   Not on file  Social History Narrative   Lives at home with husband   Caffeine, maybe 1 a day   Social Determinants of Health   Financial Resource Strain: Not on file  Food Insecurity: Not on file  Transportation Needs: Not on file  Physical Activity: Not on file  Stress: Not on file  Social Connections: Not on file    Family History  Problem Relation Age of Onset   Diabetes Mother        diabetic coma   Heart failure Mother  Diabetes Father    Emphysema Father    Diabetes Sister    Hypercholesterolemia Sister    Hypercholesterolemia Brother    Diabetes Brother    Hypertension Brother    Hypercholesterolemia Brother    Diabetes Sister    Hypertension Sister    Hypercholesterolemia Sister    Hypercholesterolemia Sister    Hypercholesterolemia Sister    Diabetes Brother    Hypertension Brother    Colon cancer Neg Hx     Review of Systems  Constitutional:  Negative for fever.  Respiratory:  Negative for cough, shortness of breath and wheezing.   Cardiovascular:  Negative for chest pain, palpitations and leg swelling.  Musculoskeletal:  Positive for arthralgias.  Neurological:  Negative for light-headedness and headaches.      Objective:   Vitals:   01/03/21 1058  BP: (!) 142/84  Pulse: 60  Temp: 97.9 F (36.6 C)  SpO2: 98%   BP Readings from Last 3 Encounters:  01/03/21 (!) 142/84  10/25/20 137/87  09/25/20 (!) 156/88   Wt Readings from Last 3 Encounters:  01/03/21 190 lb (86.2 kg)  09/25/20 191 lb (86.6 kg)  08/21/20 190 lb (86.2 kg)   Body mass index is 28.06 kg/m.   Physical Exam    Constitutional: Appears well-developed and well-nourished. No distress.  HENT:  Head: Normocephalic and atraumatic.  Neck: Neck supple. No tracheal deviation present. No thyromegaly present.  No cervical  lymphadenopathy Cardiovascular: Normal rate, regular rhythm and normal heart sounds.   No murmur heard. No carotid bruit .  No edema Pulmonary/Chest: Effort normal and breath sounds normal. No respiratory distress. No has no wheezes. No rales. Abdomen: Soft, nontender, nondistended Musculoskeletal: Mild tenderness anterior iliac crest, no groin pain Skin: Skin is warm and dry. Not diaphoretic.  Psychiatric: Normal mood and affect. Behavior is normal.      Assessment & Plan:    See Problem List for Assessment and Plan of chronic medical problems.

## 2021-01-02 NOTE — Patient Instructions (Addendum)
    Flu immunization administered today.     Blood work was ordered.     Medications changes include :   none     Please followup in 6 months  

## 2021-01-03 ENCOUNTER — Ambulatory Visit (INDEPENDENT_AMBULATORY_CARE_PROVIDER_SITE_OTHER): Payer: PPO | Admitting: Internal Medicine

## 2021-01-03 ENCOUNTER — Other Ambulatory Visit: Payer: Self-pay

## 2021-01-03 VITALS — BP 142/84 | HR 60 | Temp 97.9°F | Ht 69.0 in | Wt 190.0 lb

## 2021-01-03 DIAGNOSIS — E1140 Type 2 diabetes mellitus with diabetic neuropathy, unspecified: Secondary | ICD-10-CM

## 2021-01-03 DIAGNOSIS — Z23 Encounter for immunization: Secondary | ICD-10-CM

## 2021-01-03 DIAGNOSIS — I10 Essential (primary) hypertension: Secondary | ICD-10-CM | POA: Diagnosis not present

## 2021-01-03 DIAGNOSIS — K219 Gastro-esophageal reflux disease without esophagitis: Secondary | ICD-10-CM | POA: Diagnosis not present

## 2021-01-03 DIAGNOSIS — M25561 Pain in right knee: Secondary | ICD-10-CM | POA: Diagnosis not present

## 2021-01-03 DIAGNOSIS — M5410 Radiculopathy, site unspecified: Secondary | ICD-10-CM | POA: Diagnosis not present

## 2021-01-03 DIAGNOSIS — E7849 Other hyperlipidemia: Secondary | ICD-10-CM

## 2021-01-03 LAB — COMPREHENSIVE METABOLIC PANEL
ALT: 13 U/L (ref 0–35)
AST: 18 U/L (ref 0–37)
Albumin: 3.8 g/dL (ref 3.5–5.2)
Alkaline Phosphatase: 62 U/L (ref 39–117)
BUN: 14 mg/dL (ref 6–23)
CO2: 30 mEq/L (ref 19–32)
Calcium: 9.6 mg/dL (ref 8.4–10.5)
Chloride: 104 mEq/L (ref 96–112)
Creatinine, Ser: 1.07 mg/dL (ref 0.40–1.20)
GFR: 51.13 mL/min — ABNORMAL LOW (ref >60.00)
Glucose, Bld: 79 mg/dL (ref 70–99)
Potassium: 4 mEq/L (ref 3.5–5.1)
Sodium: 139 mEq/L (ref 135–145)
Total Bilirubin: 0.5 mg/dL (ref 0.2–1.2)
Total Protein: 7.5 g/dL (ref 6.0–8.3)

## 2021-01-03 LAB — HEMOGLOBIN A1C: Hgb A1c MFr Bld: 6.3 % (ref 4.6–6.5)

## 2021-01-03 LAB — LIPID PANEL
Cholesterol: 128 mg/dL (ref 0–200)
HDL: 50.7 mg/dL (ref >39.00)
LDL Cholesterol: 69 mg/dL (ref 0–99)
NonHDL: 77.71
Total CHOL/HDL Ratio: 3
Triglycerides: 42 mg/dL (ref 0.0–149.0)
VLDL: 8.4 mg/dL (ref 0.0–40.0)

## 2021-01-03 MED ORDER — ATORVASTATIN CALCIUM 10 MG PO TABS: 10.0000 mg | ORAL_TABLET | Freq: Every day | ORAL | 0 refills | Status: DC

## 2021-01-03 NOTE — Assessment & Plan Note (Signed)
Chronic GERD controlled Continue omeprazole 40 mg daily 

## 2021-01-03 NOTE — Assessment & Plan Note (Signed)
Acute She has a burning/tingling sensation in her right lower abdomen/pelvic region that is intermittent-typically when she is sitting-not related to other activities She has little tenderness on her anterior iliac crest Possible radiculopathy from the back or referred pain She will monitor for now

## 2021-01-03 NOTE — Assessment & Plan Note (Signed)
Chronic Check lipid panel  Continue atorvastatin 10 mg daily Regular exercise and healthy diet encouraged  

## 2021-01-03 NOTE — Assessment & Plan Note (Addendum)
Chronic Diet controlled Sugars 90-110 at home Check A1c Regular exercise and diabetic diet encouraged

## 2021-01-03 NOTE — Addendum Note (Signed)
Addended by: Marcina Millard on: 01/03/2021 01:57 PM   Modules accepted: Orders

## 2021-01-03 NOTE — Assessment & Plan Note (Addendum)
Chronic Blood pressure well controlled at home - she brought her log CMP Continue benazepril 40 mg daily, diltiazem 120 mg daily, metoprolol 25 mg twice daily

## 2021-01-03 NOTE — Assessment & Plan Note (Signed)
Acute on chronic She has chronic bilateral knee arthritis and recently stepped wrong and has increased right knee pain It has gotten slightly better She does see rheumatology in a couple of months and can discuss with them, but if pain is not getting better and she wants to see someone earlier she will let me know and I will refer to sports medicine

## 2021-01-17 ENCOUNTER — Other Ambulatory Visit: Payer: Self-pay | Admitting: Physician Assistant

## 2021-01-17 NOTE — Telephone Encounter (Signed)
Next Visit: 02/28/2021  Last Visit: 97/2022  Labs: 11/11/2020 White cell count is low and stable. GFR 51.13  Eye exam: 11/19/2020 WNL   Current Dose per lab note 11/11/2020: hydroxychloroquine 200 mg p.o. daily  QK:[id number] syndrome with keratoconjunctivitis sicca   Last Fill: 10/21/2020  Okay to refill Plaquenil?

## 2021-01-22 ENCOUNTER — Other Ambulatory Visit: Payer: Self-pay | Admitting: Cardiology

## 2021-01-23 ENCOUNTER — Other Ambulatory Visit: Payer: Self-pay | Admitting: Internal Medicine

## 2021-01-23 ENCOUNTER — Other Ambulatory Visit: Payer: Self-pay | Admitting: Physician Assistant

## 2021-01-27 ENCOUNTER — Telehealth: Payer: Self-pay | Admitting: Cardiology

## 2021-01-27 ENCOUNTER — Encounter: Payer: Self-pay | Admitting: *Deleted

## 2021-01-27 NOTE — Telephone Encounter (Signed)
Voicemail not set up.

## 2021-01-27 NOTE — Telephone Encounter (Signed)
Pt c/o of Chest Pain: STAT if CP now or developed within 24 hours  1. Are you having CP right now? Chest soreness   2. Are you experiencing any other symptoms (ex. SOB, nausea, vomiting, sweating)? Tenderness, back and legs hurt  3. How long have you been experiencing CP? Two days   4. Is your CP continuous or coming and going? Comes and goes   5. Have you taken Nitroglycerin? No  ?

## 2021-01-27 NOTE — Telephone Encounter (Signed)
Patient's daughter is calling to check on status of patient assistance forms she had dropped off at the office.

## 2021-01-28 MED ORDER — APIXABAN 5 MG PO TABS: 5.0000 mg | ORAL_TABLET | Freq: Two times a day (BID) | ORAL | 0 refills | Status: DC

## 2021-01-28 NOTE — Telephone Encounter (Signed)
Daughter is f/u on pt assistance. Please call pt back on her home phone [phone]

## 2021-01-28 NOTE — Telephone Encounter (Signed)
Angela Simmons verified that she has application and will take care of it.

## 2021-01-28 NOTE — Telephone Encounter (Signed)
Per daughter [name], [place] application was dropped off at the [place] office around 01/09/2021. Will follow up with [facility] staff to check status of application. Also, will provide eliquis samples.

## 2021-01-28 NOTE — Telephone Encounter (Signed)
Contacted daughter [name] to follow up on symptoms and patient reports all symptoms have resolved and feels she slept wrong. Advised to call back if symptoms reoccur. Verbalized understanding

## 2021-01-30 ENCOUNTER — Telehealth: Payer: Self-pay

## 2021-01-30 NOTE — Progress Notes (Signed)
° ° °Chronic Care Management °Pharmacy Assistant  ° °Name: Angela Simmons  MRN: [id number] DOB: 05/05/1945 ° ° °Reason for Encounter: Disease State °  °Conditions to be addressed/monitored: °HTN ° ° °Recent office visits:  °01/03/21 Burns, Stacy J, MD-PCP (Hypertension) Blood work ordered, med changes: None, 6 month f/u ° °Recent consult visits:  °None ID ° °Hospital visits:  °None in previous 6 months ° °Medications: °Outpatient Encounter Medications as of 01/30/2021  °Medication Sig  ° hydroxychloroquine (PLAQUENIL) 200 MG tablet Take 1 tablet (200 mg total) by mouth daily.  ° apixaban (ELIQUIS) 5 MG TABS tablet Take 1 tablet (5 mg total) by mouth 2 (two) times daily.  ° atorvastatin (LIPITOR) 10 MG tablet TAKE 1 TABLET BY MOUTH DAILY  ° benazepril (LOTENSIN) 40 MG tablet TAKE 1 TABLET BY MOUTH DAILY  ° Blood Glucose Monitoring Suppl (ONETOUCH VERIO) w/Device KIT USE TO TEST BLOOD SUGAR ONCE DAILY AS DIRECTED  ° diclofenac sodium (VOLTAREN) 1 % GEL Apply 3 g to 3 large joints up to 3 times daily.  ° DILT-XR 120 MG 24 hr capsule TAKE 1 CAPSULE BY MOUTH EVERY DAY  ° fluticasone (FLONASE) 50 MCG/ACT nasal spray USE 2 SPRAYS IN EACH NOSTRIL EVERY DAY  ° Ginger, Zingiber officinalis, 550 MG CAPS Take 1 capsule (550 mg total) by mouth daily.  ° glucose blood test strip USE TO TEST BLOOD SUGAR ONCE DAILY. E11.40  ° loratadine (CLARITIN) 10 MG tablet TAKE 1 TABLET BY MOUTH EVERY DAY  ° metoprolol tartrate (LOPRESSOR) 25 MG tablet TAKE 1 TABLET BY MOUTH TWICE DAILY  ° Multiple Vitamins-Minerals (CENTRUM SILVER 50+WOMEN) TABS Take 1 tablet by mouth daily at 6 (six) AM.  ° nitroGLYCERIN (NITROSTAT) 0.4 MG SL tablet Place 1 tablet (0.4 mg total) under the tongue every 5 (five) minutes as needed for chest pain.  ° Omega-3 Fatty Acids (FISH OIL) 1000 MG CAPS Take 1,000 mg by mouth daily.  ° omeprazole (PRILOSEC) 40 MG capsule TAKE ONE CAPSULE BY MOUTH DAILY  ° ONETOUCH DELICA LANCETS 30G MISC USE TO TEST ONCE DAILY  ° ONETOUCH  VERIO test strip USE TO TEST BLOOD SUGAR ONCE DAILY  ° Turmeric (QC TUMERIC COMPLEX) 500 MG CAPS Take 1 capsule by mouth daily.  ° °No facility-administered encounter medications on file as of 01/30/2021.  ° ° °Recent Office Vitals: °BP Readings from Last 3 Encounters:  °01/03/21 (!) 142/84  °10/25/20 137/87  °09/25/20 (!) 156/88  ° °Pulse Readings from Last 3 Encounters:  °01/03/21 60  °09/25/20 (!) 54  °08/21/20 65  °  °Wt Readings from Last 3 Encounters:  °01/03/21 190 lb (86.2 kg)  °09/25/20 191 lb (86.6 kg)  °08/21/20 190 lb (86.2 kg)  °  ° °Kidney Function °Lab Results  °Component Value Date/Time  ° CREATININE 1.07 01/03/2021 11:44 AM  ° CREATININE 1.12 (H) 09/25/2020 11:11 AM  ° CREATININE 0.96 06/28/2020 12:48 PM  ° CREATININE 0.92 04/23/2020 11:44 AM  ° GFR 51.13 (L) 01/03/2021 11:44 AM  ° GFRNONAA 62 04/23/2020 11:44 AM  ° GFRAA 72 04/23/2020 11:44 AM  ° ° °BMP Latest Ref Rng & Units 01/03/2021 09/25/2020 06/28/2020  °Glucose 70 - 99 mg/dL 79 118(H) 80  °BUN 6 - 23 mg/dL 14 13 12  °Creatinine 0.40 - 1.20 mg/dL 1.07 1.12(H) 0.96  °BUN/Creat Ratio 6 - 22 (calc) - 12 -  °Sodium 135 - 145 mEq/L 139 141 140  °Potassium 3.5 - 5.1 mEq/L 4.0 4.2 4.0  °Chloride 96 - 112   112 mEq/L 104 106 103  CO2 19 - 32 mEq/L _0 Calcium 8.4 - 10.5 mg/dL 9.6 9.1 9.4   Reviewed chart prior to disease state call. Spoke with patient regarding BP  Current antihypertensive regimen:  Benazepril 40 mg Metoprolol tartrate 25 mg  How often are you checking your Blood Pressure? daily, Spoke with patient daughter who said that patient checks bp every morning. She also said that she has not checked in the last few days because of the death of patient husband  Current home BP readings: Patient does not have any readings at this time, daughter stated that she will go to her mothers today and check bp  What recent interventions/DTPs have been made by any provider to improve Blood Pressure control since last CPP Visit: None  noted  Any recent hospitalizations or ED visits since last visit with CPP? No  What diet changes have been made to improve Blood Pressure Control?  Daughter states that patient has not made any new changes in diet  What exercise is being done to improve your Blood Pressure Control?  Daughter states patient has not been able to exercise   Adherence Review: Is the patient currently on ACE/ARB medication? Yes Does the patient have >5 day gap between last estimated fill dates? No   Care Gaps: Colonoscopy-06/22/11 Diabetic Foot Exam-06/21/19 Mammogram-03/20/20 Ophthalmology-01/10/20 Dexa Scan - 08/26/20 Annual Well Visit - NA Micro albumin-NA Hemoglobin A1c- 01/03/21  Star Rating Drugs: Atorvastatin 10 mg-last fill 11/04/20 90 ds Benazepril 40 mg-last fill 01/25/21 90 ds   Ethelene Hal Clinical Pharmacist Assistant [phone]

## 2021-02-03 NOTE — Telephone Encounter (Signed)
° °  Pt's daughter calling to f/u pt's application for eliquis, she said the pt is also running out of meds

## 2021-02-04 NOTE — Telephone Encounter (Signed)
Daughter informed that we are still awaiting approval from patient assistance. Samples Eliquis 5 mg # 42, lot [id number], exp 11/2022 given.

## 2021-02-12 NOTE — Progress Notes (Signed)
Subjective:    Patient ID: Angela Simmons, female    DOB: [birth year], 76 y.o.   MRN: [id number]  This visit occurred during the SARS-CoV-2 public health emergency.  Safety protocols were in place, including screening questions prior to the visit, additional usage of staff PPE, and extensive cleaning of exam room while observing appropriate contact time as indicated for disinfecting solutions.    HPI The patient is here for an acute visit.  Stiffness right side of neck to arm, breast/chest - the stiffness/tightness/pain comes and goes.  Sometimes N/T goes down into fingers.  She also has some skin itching in her upper arm.   Right side of chest - pain x 1 week. Rest improves the pain. Movement makes it worse.     Knees swollen at times - no pain  they are stiff when she gets up.    Medications and allergies reviewed with patient and updated if appropriate.  Patient Active Problem List   Diagnosis Date Noted   Cervical radiculopathy due to degenerative joint disease of spine 02/13/2021   Right-sided chest pain 02/13/2021   Bilateral primary osteoarthritis of knee 02/13/2021   Right knee pain 01/03/2021   Radiculopathy 01/03/2021   Atrial flutter ([place]) 08/22/2020   Aortic atherosclerosis ([place]) 06/28/2020   Secondary hypercoagulable state ([place]) 12/26/2019   Anticoagulated 12/05/2019   PAF (paroxysmal atrial fibrillation) ([place]) 08/04/2019   Thrush 08/02/2019   Community acquired pneumonia 07/22/2019   DOE (dyspnea on exertion) 04/03/2019   Ankle swelling, left 04/03/2019   Hyperpigmented skin lesion 04/03/2019   Poor balance 12/06/2018   Headache 12/06/2018   Vertigo 06/07/2018   Urinary frequency 06/07/2018   Lower abdominal pain 06/07/2018   Herpes zoster without complication 69/79/4801   Tingling in extremities 11/26/2017   Ischial bursitis of left side 06/02/2017   Sicca syndrome ([place]) 06/23/2016   Bilateral leg edema 04/07/2016   Burning sensation of skin 04/07/2016    PMR (polymyalgia rheumatica) ([place]) 03/27/2016   Osteopenia 03/04/2016   Autoimmune disease ([place]) 03/18/2015   Inflammatory arthritis 03/18/2015   Joint pain 11/22/2014   Overweight (BMI 25.0-29.9) 08/18/2014   Low back pain with radiation 08/18/2014   Hyperlipidemia 06/28/2014   Chest pain with moderate risk of acute coronary syndrome 04/11/2014   Tendonitis of shoulder 08/29/2013   S/P right mastectomy 11/14/2012   Type 2 diabetes, controlled, with neuropathy ([place]) 10/05/2012   Allergic rhinitis 01/26/2011   GERD 11/10/2009   INTERSTITIAL CYSTITIS 10/31/2008   BENIGN NEOPLASM OF VULVA 02/01/2008   Essential hypertension, benign 02/08/2007   History of cancer of right breast 02/08/2007    Current Outpatient Medications on File Prior to Visit  Medication Sig Dispense Refill   apixaban (ELIQUIS) 5 MG TABS tablet Take 1 tablet (5 mg total) by mouth 2 (two) times daily. 56 tablet 0   atorvastatin (LIPITOR) 10 MG tablet TAKE 1 TABLET BY MOUTH DAILY 90 tablet 1   benazepril (LOTENSIN) 40 MG tablet TAKE 1 TABLET BY MOUTH DAILY 90 tablet 0   Blood Glucose Monitoring Suppl (ONETOUCH VERIO) w/Device KIT USE TO TEST BLOOD SUGAR ONCE DAILY AS DIRECTED 1 kit 0   diclofenac sodium (VOLTAREN) 1 % GEL Apply 3 g to 3 large joints up to 3 times daily. 3 Tube 3   DILT-XR 120 MG 24 hr capsule TAKE 1 CAPSULE BY MOUTH EVERY DAY 90 capsule 3   fluticasone (FLONASE) 50 MCG/ACT nasal spray USE 2 SPRAYS IN EACH NOSTRIL EVERY DAY 48  g 0   Ginger, Zingiber officinalis, 550 MG CAPS Take 1 capsule (550 mg total) by mouth daily. 60 capsule 0   glucose blood test strip USE TO TEST BLOOD SUGAR ONCE DAILY. E11.40 100 each 3   hydroxychloroquine (PLAQUENIL) 200 MG tablet Take 1 tablet (200 mg total) by mouth daily. 90 tablet 0   loratadine (CLARITIN) 10 MG tablet TAKE 1 TABLET BY MOUTH EVERY DAY 30 tablet 2   metoprolol tartrate (LOPRESSOR) 25 MG tablet TAKE 1 TABLET BY MOUTH TWICE DAILY 60 tablet 6   Multiple  Vitamins-Minerals (CENTRUM SILVER 50+WOMEN) TABS Take 1 tablet by mouth daily at 6 (six) AM.     Omega-3 Fatty Acids (FISH OIL) 1000 MG CAPS Take 1,000 mg by mouth daily.     omeprazole (PRILOSEC) 40 MG capsule TAKE ONE CAPSULE BY MOUTH DAILY 90 capsule 3   ONETOUCH DELICA LANCETS 85U MISC USE TO TEST ONCE DAILY 100 each 2   ONETOUCH VERIO test strip USE TO TEST BLOOD SUGAR ONCE DAILY 100 each 0   Turmeric (QC TUMERIC COMPLEX) 500 MG CAPS Take 1 capsule by mouth daily.     nitroGLYCERIN (NITROSTAT) 0.4 MG SL tablet Place 1 tablet (0.4 mg total) under the tongue every 5 (five) minutes as needed for chest pain. 25 tablet 3   No current facility-administered medications on file prior to visit.    Past Medical History:  Diagnosis Date   Breast cancer [facility]) 1990s   right breast   Collagen vascular disease ([place])    Diabetes mellitus without complication ([place])    GERD (gastroesophageal reflux disease)    HTN (hypertension)    Hypercholesterolemia    Obesity    Osteoarthritis     Past Surgical History:  Procedure Laterality Date   ABDOMINAL HY[place]CTOMY  1993   [place], fibroids   AORTIC ARCH ANGIOGRAPHY N/A 12/07/2019   Procedure: AORTIC ARCH ANGIOGRAPHY;  Surgeon: Lorretta Harp, MD;  Location: [facility];  Service: Cardiovascular;  Laterality: N/A;   COLONOSCOPY  May 2003   Dr. Tamala Julian: normal   COLONOSCOPY  06/22/2011   Procedure: COLONOSCOPY;  Surgeon: Danie Binder, MD;  [facility];  Service: Endoscopy;  Laterality: N/A;  10:30   LEFT HEART CATH AND CORONARY ANGIOGRAPHY N/A 12/07/2019   Procedure: LEFT HEART CATH AND CORONARY ANGIOGRAPHY;  Surgeon: Lorretta Harp, MD;  Location: [facility];  Service: Cardiovascular;  Laterality: N/A;   MASTECTOMY  1995   right     Social History   Socioeconomic History   Marital status: Widowed    Spouse name: Not on file   Number of children: 3   Years of education: 7   Highest education level: Not on file   Occupational History   Not on file  Tobacco Use   Smoking status: Never   Smokeless tobacco: Never  Vaping Use   Vaping Use: Never used  Substance and Sexual Activity   Alcohol use: No   Drug use: Never   Sexual activity: Never    Birth control/protection: Rhythm  Other Topics Concern   Not on file  Social History Narrative   Lives at home with husband   Caffeine, maybe 1 a day   Social Determinants of Health   Financial Resource Strain: Not on file  Food Insecurity: Not on file  Transportation Needs: Not on file  Physical Activity: Not on file  Stress: Not on file  Social Connections: Not on file  Family History  Problem Relation Age of Onset   Diabetes Mother        diabetic coma   Heart failure Mother    Diabetes Father    Emphysema Father    Diabetes Sister    Hypercholesterolemia Sister    Hypercholesterolemia Brother    Diabetes Brother    Hypertension Brother    Hypercholesterolemia Brother    Diabetes Sister    Hypertension Sister    Hypercholesterolemia Sister    Hypercholesterolemia Sister    Hypercholesterolemia Sister    Diabetes Brother    Hypertension Brother    Colon cancer Neg Hx     Review of Systems  Constitutional:  Negative for fever.  Respiratory:  Negative for cough, shortness of breath and wheezing.   Cardiovascular:  Negative for chest pain and palpitations.  Musculoskeletal:  Positive for neck pain.  Neurological:  Positive for numbness and headaches (frontal). Negative for weakness.      Objective:   Vitals:   02/13/21 1041  BP: 132/80  Pulse: 60  Temp: 98 F (36.7 C)  SpO2: 99%   BP Readings from Last 3 Encounters:  02/13/21 132/80  01/03/21 (!) 142/84  10/25/20 137/87   Wt Readings from Last 3 Encounters:  02/13/21 186 lb (84.4 kg)  01/03/21 190 lb (86.2 kg)  09/25/20 191 lb (86.6 kg)   Body mass index is 27.47 kg/m.   Physical Exam Constitutional:      General: She is not in acute distress.     Appearance: Normal appearance. She is not ill-appearing.  HENT:     Head: Normocephalic and atraumatic.  Pulmonary:     Effort: Pulmonary effort is normal. No respiratory distress.     Breath sounds: No wheezing or rales.  Chest:     Chest wall: Tenderness (right upper chest) present.  Musculoskeletal:        General: Swelling (minimal - b/l knees) and tenderness (right posterior neck and upper back) present.     Right lower leg: No edema.     Left lower leg: No edema.  Skin:    General: Skin is warm and dry.     Findings: No erythema or rash.  Neurological:     Mental Status: She is alert.     Sensory: No sensory deficit.     Motor: No weakness.          Assessment & Plan:    See Problem List for Assessment and Plan of chronic medical problems.

## 2021-02-13 ENCOUNTER — Ambulatory Visit: Payer: PPO

## 2021-02-13 ENCOUNTER — Ambulatory Visit (INDEPENDENT_AMBULATORY_CARE_PROVIDER_SITE_OTHER): Payer: Medicare HMO | Admitting: Internal Medicine

## 2021-02-13 ENCOUNTER — Ambulatory Visit (INDEPENDENT_AMBULATORY_CARE_PROVIDER_SITE_OTHER): Payer: Medicare HMO

## 2021-02-13 ENCOUNTER — Encounter: Payer: Self-pay | Admitting: Internal Medicine

## 2021-02-13 ENCOUNTER — Other Ambulatory Visit: Payer: Self-pay

## 2021-02-13 VITALS — BP 132/80 | HR 60 | Temp 98.0°F | Ht 69.0 in | Wt 186.0 lb

## 2021-02-13 DIAGNOSIS — M4722 Other spondylosis with radiculopathy, cervical region: Secondary | ICD-10-CM | POA: Insufficient documentation

## 2021-02-13 DIAGNOSIS — R079 Chest pain, unspecified: Secondary | ICD-10-CM

## 2021-02-13 DIAGNOSIS — M170 Bilateral primary osteoarthritis of knee: Secondary | ICD-10-CM | POA: Diagnosis not present

## 2021-02-13 DIAGNOSIS — M542 Cervicalgia: Secondary | ICD-10-CM | POA: Diagnosis not present

## 2021-02-13 DIAGNOSIS — I10 Essential (primary) hypertension: Secondary | ICD-10-CM | POA: Diagnosis not present

## 2021-02-13 DIAGNOSIS — M47812 Spondylosis without myelopathy or radiculopathy, cervical region: Secondary | ICD-10-CM | POA: Diagnosis not present

## 2021-02-13 NOTE — Patient Instructions (Addendum)
° ° ° °  Xrays were ordered.     Medications changes include :   none   Try the voltaren gel and heat for your pain.   You can take tylenol.     If your symptoms do not improve let us know.

## 2021-02-13 NOTE — Assessment & Plan Note (Signed)
Chronic Had recent xrays - moderate OA Swelling, stiffness related to OA Continue voltaren gel Restart exercise

## 2021-02-13 NOTE — Assessment & Plan Note (Signed)
Acute Right sided chest wall pain - worse with movement, better with rest Denies injuries Will get xray Symptomatic treatment

## 2021-02-13 NOTE — Assessment & Plan Note (Signed)
Acute Right sided neck pain/stiffness with radiation to right arm , tingling down right arm - no weakness Start heat, voltaren gel, tylenol Deferred PT Deferred muscle relaxer or prednisone Call if no improvement

## 2021-02-13 NOTE — Assessment & Plan Note (Signed)
Chronic BP well controlled here today and well controlled at home -she brought in her log Continue benazepril 40 mg daily, metoprolol 25 mg bid

## 2021-02-14 DIAGNOSIS — H903 Sensorineural hearing loss, bilateral: Secondary | ICD-10-CM | POA: Diagnosis not present

## 2021-02-14 NOTE — Progress Notes (Deleted)
Office Visit Note  Patient: Angela Simmons             Date of Birth: 1945/06/22           MRN: [id number]             PCP: Binnie Rail, MD Referring: Binnie Rail, MD Visit Date: 02/28/2021 Occupation: @GUAROCC @  Subjective:  No chief complaint on file.   History of Present Illness: JALEIYAH Simmons is a 76 y.o. female ***   Activities of Daily Living:  Patient reports morning stiffness for *** {minute/hour:19697}.   Patient {ACTIONS;DENIES/REPORTS:21021675::"Denies"} nocturnal pain.  Difficulty dressing/grooming: {ACTIONS;DENIES/REPORTS:21021675::"Denies"} Difficulty climbing stairs: {ACTIONS;DENIES/REPORTS:21021675::"Denies"} Difficulty getting out of chair: {ACTIONS;DENIES/REPORTS:21021675::"Denies"} Difficulty using hands for taps, buttons, cutlery, and/or writing: {ACTIONS;DENIES/REPORTS:21021675::"Denies"}  No Rheumatology ROS completed.   PMFS History:  Patient Active Problem List   Diagnosis Date Noted   Cervical radiculopathy due to degenerative joint disease of spine 02/13/2021   Right-sided chest pain 02/13/2021   Bilateral primary osteoarthritis of knee 02/13/2021   Right knee pain 01/03/2021   Radiculopathy 01/03/2021   Atrial flutter ([place]) 08/22/2020   Aortic atherosclerosis ([place]) 06/28/2020   Secondary hypercoagulable state ([place]) 12/26/2019   Anticoagulated 12/05/2019   PAF (paroxysmal atrial fibrillation) ([place]) 08/04/2019   Thrush 08/02/2019   Community acquired pneumonia 07/22/2019   DOE (dyspnea on exertion) 04/03/2019   Ankle swelling, left 04/03/2019   Hyperpigmented skin lesion 04/03/2019   Poor balance 12/06/2018   Headache 12/06/2018   Vertigo 06/07/2018   Urinary frequency 06/07/2018   Lower abdominal pain 06/07/2018   Herpes zoster without complication 50/53/9767   Tingling in extremities 11/26/2017   Ischial bursitis of left side 06/02/2017   Sicca syndrome ([place]) 06/23/2016   Bilateral leg edema 04/07/2016   Burning sensation of  skin 04/07/2016   PMR (polymyalgia rheumatica) ([place]) 03/27/2016   Osteopenia 03/04/2016   Autoimmune disease ([place]) 03/18/2015   Inflammatory arthritis 03/18/2015   Joint pain 11/22/2014   Overweight (BMI 25.0-29.9) 08/18/2014   Low back pain with radiation 08/18/2014   Hyperlipidemia 06/28/2014   Chest pain with moderate risk of acute coronary syndrome 04/11/2014   Tendonitis of shoulder 08/29/2013   S/P right mastectomy 11/14/2012   Type 2 diabetes, controlled, with neuropathy ([place]) 10/05/2012   Allergic rhinitis 01/26/2011   GERD 11/10/2009   INTERSTITIAL CYSTITIS 10/31/2008   BENIGN NEOPLASM OF VULVA 02/01/2008   Essential hypertension, benign 02/08/2007   History of cancer of right breast 02/08/2007    Past Medical History:  Diagnosis Date   Breast cancer ([place]) 1990s   right breast   Collagen vascular disease ([place])    Diabetes mellitus without complication ([place])    GERD (gastroesophageal reflux disease)    HTN (hypertension)    Hypercholesterolemia    Obesity    Osteoarthritis     Family History  Problem Relation Age of Onset   Diabetes Mother        diabetic coma   Heart failure Mother    Diabetes Father    Emphysema Father    Diabetes Sister    Hypercholesterolemia Sister    Hypercholesterolemia Brother    Diabetes Brother    Hypertension Brother    Hypercholesterolemia Brother    Diabetes Sister    Hypertension Sister    Hypercholesterolemia Sister    Hypercholesterolemia Sister    Hypercholesterolemia Sister    Diabetes Brother    Hypertension Brother    Colon cancer Neg Hx    Past Surgical  History:  Procedure Laterality Date   ABDOMINAL HY[place]CTOMY  [place]   [place]roids   AORTIC ARCH ANGIOGRAPHY N/A 12/07/2019   Procedure: AORTIC ARCH ANGIOGRAPHY;  Surgeon: Lorretta Harp, MD;  Location: [facility];  Service: Cardiovascular;  Laterality: N/A;   COLONOSCOPY  May 2003   Dr. Tamala Julian: normal   COLONOSCOPY  06/22/2011   Procedure:  COLONOSCOPY;  Surgeon: Danie Binder, MD;  [facility];  Service: Endoscopy;  Laterality: N/A;  10:30   LEFT HEART CATH AND CORONARY ANGIOGRAPHY N/A 12/07/2019   Procedure: LEFT HEART CATH AND CORONARY ANGIOGRAPHY;  Surgeon: Lorretta Harp, MD;  Location: [facility];  Service: Cardiovascular;  Laterality: N/A;   MASTECTOMY  1995   right    Social History   Social History Narrative   Lives at home with husband   Caffeine, maybe 1 a day   Immunization History  Administered Date(s) Administered   Fluad Quad(high Dose 65+) 12/06/2018, 12/27/2019, 01/03/2021   Influenza Split 11/14/2010   Influenza Whole 10/18/2006, 10/31/2008, 11/06/2009   Influenza, High Dose Seasonal PF 11/22/2014, 02/21/2016, 12/18/2016, 11/05/2017   Influenza,inj,Quad PF,6+ Mos 11/14/2012, 11/23/2013   Moderna Sars-Covid-2 Vaccination 03/16/2019, 04/13/2019   Pneumococcal Conjugate-13 04/11/2014   Pneumococcal Polysaccharide-23 09/17/2011   Td 07/05/2003   Zoster Recombinat (Shingrix) 11/30/2017, 02/02/2018   Zoster, Live 06/15/2013     Objective: Vital Signs: There were no vitals taken for this visit.   Physical Exam   Musculoskeletal Exam: ***  CDAI Exam: CDAI Score: -- Patient Global: --; Provider Global: -- Swollen: --; Tender: -- Joint Exam 02/28/2021   No joint exam has been documented for this visit   There is currently no information documented on the homunculus. Go to the Rheumatology activity and complete the homunculus joint exam.  Investigation: No additional findings.  Imaging: DG Chest 2 View  Result Date: 02/14/2021 CLINICAL DATA:  76 year old female with right-sided chest pain EXAM: CHEST - 2 VIEW COMPARISON:  08/22/2019 FINDINGS: Cardiomediastinal silhouette unchanged in size and contour. No evidence of central vascular congestion. No interlobular septal thickening. No pneumothorax or pleural effusion. Coarsened interstitial markings, with no confluent  airspace disease. No acute displaced fracture. Degenerative changes of the spine. Surgical changes of the right chest wall IMPRESSION: No active cardiopulmonary disease. Electronically Signed   By: Corrie Mckusick D.O.   On: 02/14/2021 09:09    Recent Labs: Lab Results  Component Value Date   WBC 3.4 (L) 11/11/2020   HGB 12.0 11/11/2020   PLT 205 11/11/2020   NA 139 01/03/2021   K 4.0 01/03/2021   CL 104 01/03/2021   CO2 30 01/03/2021   GLUCOSE 79 01/03/2021   BUN 14 01/03/2021   CREATININE 1.07 01/03/2021   BILITOT 0.5 01/03/2021   ALKPHOS 62 01/03/2021   AST 18 01/03/2021   ALT 13 01/03/2021   PROT 7.5 01/03/2021   ALBUMIN 3.8 01/03/2021   CALCIUM 9.6 01/03/2021   GFRAA 72 04/23/2020   QFTBGOLDPLUS Negative 07/25/2019    Speciality Comments: PLQ EYe Exam: 11/19/2020 WNL @ [facility] [place] up in 6 months  Procedures:  No procedures performed Allergies: Levofloxacin, Nitrofurantoin, Penicillins, Sulfonamide derivatives, and Pantoprazole   Assessment / Plan:     Visit Diagnoses: No diagnosis found.  Orders: No orders of the defined types were placed in this encounter.  No orders of the defined types were placed in this encounter.   Face-to-face time spent with patient was *** minutes. Greater  than 50% of time was spent in counseling and coordination of care.  Follow-Up Instructions: No follow-ups on file.   Earnestine Mealing, CMA  Note - This record has been created using Editor, commissioning.  Chart creation errors have been sought, but may not always  have been located. Such creation errors do not reflect on  the standard of medical care.

## 2021-02-17 DIAGNOSIS — Z0100 Encounter for examination of eyes and vision without abnormal findings: Secondary | ICD-10-CM | POA: Diagnosis not present

## 2021-02-20 ENCOUNTER — Telehealth: Payer: Self-pay | Admitting: *Deleted

## 2021-02-20 NOTE — Telephone Encounter (Signed)
Pt daughter notified that pt was denied for Pt assistance for Eliquis. She needs to pay $403.98 and she will be approved.

## 2021-02-24 ENCOUNTER — Other Ambulatory Visit: Payer: Self-pay

## 2021-02-24 ENCOUNTER — Encounter: Payer: Self-pay | Admitting: Cardiology

## 2021-02-24 ENCOUNTER — Ambulatory Visit (INDEPENDENT_AMBULATORY_CARE_PROVIDER_SITE_OTHER): Payer: Medicare HMO | Admitting: Cardiology

## 2021-02-24 VITALS — BP 140/80 | HR 65 | Ht 69.0 in | Wt 188.0 lb

## 2021-02-24 DIAGNOSIS — E782 Mixed hyperlipidemia: Secondary | ICD-10-CM

## 2021-02-24 DIAGNOSIS — R0602 Shortness of breath: Secondary | ICD-10-CM

## 2021-02-24 DIAGNOSIS — I4892 Unspecified atrial flutter: Secondary | ICD-10-CM | POA: Diagnosis not present

## 2021-02-24 DIAGNOSIS — I10 Essential (primary) hypertension: Secondary | ICD-10-CM | POA: Diagnosis not present

## 2021-02-24 MED ORDER — APIXABAN 5 MG PO TABS: 5.0000 mg | ORAL_TABLET | Freq: Two times a day (BID) | ORAL | 0 refills | Status: DC

## 2021-02-24 NOTE — Progress Notes (Signed)
Clinical Summary Ms. Lasseigne is a 76 y.o.female seen today for follow up of the following medical problems.    1. Aflutter/ Atach - noted during 07/2019 admission. Started on lopressor and eliquis at that time.  - - seen in ER 08/23/19 with palpitations, SOB. EKG at the stime shows NSR   -higher doses of dilt caused dizziness, fatigue.    06/2020 monitor showed infrequent short runs of SVT.    - denies or palpitations - compliant with meds - no bleeding on eliquis    2. SOB - recent SOB/DOE.  - no recent LE edema. Occasional cough, no wheezing. - DOE just walking in from parking lot - some right sided chest soreness that is positional.      3. History of chest pain - cardiac catheterization in 11/2019 showed a normal left system as outlined above but the RCA was unable to be engaged. Medical therapy was recommended unless she had refractory symptoms - very infrequent symptoms  - no recent cardiac chest pain.      4. HTN - compliant with meds   5. Hyperlipdiemia 06/2020 TC 132 TG 44 HDL 47 LDL 76 12/2020 TC 128 TG 42 HDL 50 LDL 69 - she is on atorvastatin 41m           Past Medical History:  Diagnosis Date   [place] [place]   [place] breast   [place]    Diabetes mellitus without complication (HCC)    GERD (gastroesophageal reflux disease)    HTN (hypertension)    Hypercholesterolemia    Obesity    Osteoarthritis      Allergies  Allergen Reactions   Levofloxacin Hives and Itching   Nitrofurantoin Hives and Itching   Penicillins Hives and Itching   Sulfonamide Derivatives Itching   Pantoprazole Other (See Comments)    Bad dreams     Current Outpatient Medications  Medication Sig Dispense Refill   apixaban (ELIQUIS) 5 MG TABS tablet Take 1 tablet (5 mg total) by mouth 2 (two) times daily. 56 tablet 0   atorvastatin (LIPITOR) 10 MG tablet TAKE 1 TABLET BY MOUTH DAILY 90 tablet 1   benazepril (LOTENSIN) 40 MG  tablet TAKE 1 TABLET BY MOUTH DAILY 90 tablet 0   Blood Glucose Monitoring Suppl (ONETOUCH VERIO) w/Device KIT USE TO TEST BLOOD SUGAR ONCE DAILY AS DIRECTED 1 kit 0   diclofenac sodium (VOLTAREN) 1 % GEL Apply 3 g to 3 large joints up to 3 times daily. 3 Tube 3   DILT-XR 120 MG 24 hr capsule TAKE 1 CAPSULE BY MOUTH EVERY DAY 90 capsule 3   fluticasone (FLONASE) 50 MCG/ACT nasal spray USE 2 SPRAYS IN EACH NOSTRIL EVERY DAY 48 g 0   Ginger, Zingiber officinalis, 550 MG CAPS Take 1 capsule (550 mg total) by mouth daily. 60 capsule 0   glucose blood test strip USE TO TEST BLOOD SUGAR ONCE DAILY. E11.40 100 each 3   hydroxychloroquine (PLAQUENIL) 200 MG tablet Take 1 tablet (200 mg total) by mouth daily. 90 tablet 0   loratadine (CLARITIN) 10 MG tablet TAKE 1 TABLET BY MOUTH EVERY DAY 30 tablet 2   metoprolol tartrate (LOPRESSOR) 25 MG tablet TAKE 1 TABLET BY MOUTH TWICE DAILY 60 tablet 6   Multiple Vitamins-Minerals (CENTRUM SILVER 50+WOMEN) TABS Take 1 tablet by mouth daily at 6 (six) AM.     nitroGLYCERIN (NITROSTAT) 0.4 MG SL tablet Place 1 tablet (0.4 mg total) under  the tongue every 5 (five) minutes as needed for chest pain. 25 tablet 3   Omega-3 Fatty Acids (FISH OIL) 1000 MG CAPS Take 1,000 mg by mouth daily.     omeprazole (PRILOSEC) 40 MG capsule TAKE ONE CAPSULE BY MOUTH DAILY 90 capsule 3   ONETOUCH DELICA LANCETS 81E MISC USE TO TEST ONCE DAILY 100 each 2   ONETOUCH VERIO test strip USE TO TEST BLOOD SUGAR ONCE DAILY 100 each 0   Turmeric (QC TUMERIC COMPLEX) 500 MG CAPS Take 1 capsule by mouth daily.     No current facility-administered medications for this visit.     Past Surgical History:  Procedure Laterality Date   ABDOMINAL HYSTERECTOMY  1993   [place], fibroids   AORTIC ARCH ANGIOGRAPHY N/A 12/07/2019   Procedure: AORTIC ARCH ANGIOGRAPHY;  Surgeon: Lorretta Harp, MD;  Location: [facility];  Service: Cardiovascular;  Laterality: N/A;   COLONOSCOPY  May 2003   Dr.  Tamala Julian: normal   COLONOSCOPY  06/22/2011   Procedure: COLONOSCOPY;  Surgeon: Danie Binder, MD;  [facility];  Service: Endoscopy;  Laterality: N/A;  10:30   LEFT HEART CATH AND CORONARY ANGIOGRAPHY N/A 12/07/2019   Procedure: LEFT HEART CATH AND CORONARY ANGIOGRAPHY;  Surgeon: Lorretta Harp, MD;  Location: [facility] [facility];  Service: Cardiovascular;  Laterality: N/A;   MASTECTOMY  1995   right      Allergies  Allergen Reactions   Levofloxacin Hives and Itching   Nitrofurantoin Hives and Itching   Penicillins Hives and Itching   Sulfonamide Derivatives Itching   Pantoprazole Other (See Comments)    Bad dreams      Family History  Problem Relation Age of Onset   Diabetes Mother        diabetic coma   Heart failure Mother    Diabetes Father    Emphysema Father    Diabetes Sister    Hypercholesterolemia Sister    Hypercholesterolemia Brother    Diabetes Brother    Hypertension Brother    Hypercholesterolemia Brother    Diabetes Sister    Hypertension Sister    Hypercholesterolemia Sister    Hypercholesterolemia Sister    Hypercholesterolemia Sister    Diabetes Brother    Hypertension Brother    Colon cancer Neg Hx      Social History Ms. Haluska reports that she has never smoked. She has never used smokeless tobacco. Ms. Frieden reports no history of alcohol use.   Review of Systems CONSTITUTIONAL: No weight loss, fever, chills, weakness or fatigue.  HEENT: Eyes: No visual loss, blurred vision, double vision or yellow sclerae.No hearing loss, sneezing, congestion, runny nose or sore throat.  SKIN: No rash or itching.  CARDIOVASCULAR: per hpi RESPIRATORY: per hpi GASTROINTESTINAL: No anorexia, nausea, vomiting or diarrhea. No abdominal pain or blood.  GENITOURINARY: No burning on urination, no polyuria NEUROLOGICAL: No headache, dizziness, syncope, paralysis, ataxia, numbness or tingling in the extremities. No change in bowel or bladder  control.  MUSCULOSKELETAL: No muscle, back pain, joint pain or stiffness.  LYMPHATICS: No enlarged nodes. No history of splenectomy.  PSYCHIATRIC: No history of depression or anxiety.  ENDOCRINOLOGIC: No reports of sweating, cold or heat intolerance. No polyuria or polydipsia.  Marland Kitchen   Physical Examination Today's Vitals   02/24/21 1102  BP: (!) 150/82  Pulse: 65  SpO2: 98%  Weight: 188 lb (85.3 kg)  Height: 5' 9"  (1.753 m)   Body mass index is 27.76 kg/m.  Gen: resting comfortably, no acute distress HEENT: no scleral icterus, pupils equal round and reactive, no palptable cervical adenopathy,  CV: RRR, no m/r/g no jvd Resp: Clear to auscultation bilaterally GI: abdomen is soft, non-tender, non-distended, normal bowel sounds, no hepatosplenomegaly MSK: extremities are warm, no edema.  Skin: warm, no rash Neuro:  no focal deficits Psych: appropriate affect   Diagnostic Studies 05/2019 nuclear stress No diagnostic ST segment changes to indicate ischemia. No significant myocardial perfusion defects to indicate scar or ischemia. There is apical breast attenuation artifact This is a low risk study. Nuclear stress EF: 72%.   04/2019 echo IMPRESSIONS     1. Left ventricular ejection fraction, by estimation, is 60 to 65%. The  left ventricle has normal function. The left ventricle has no regional  wall motion abnormalities. There is mild left ventricular hypertrophy.  Left ventricular diastolic parameters  were normal.   2. Right ventricular systolic function is normal. The right ventricular  size is normal. There is mildly elevated pulmonary artery systolic  pressure.   3. The mitral valve is normal in structure. Trivial mitral valve  regurgitation. No evidence of mitral stenosis.   4. The aortic valve is tricuspid. Aortic valve regurgitation is not  visualized. No aortic stenosis is present.   5. The inferior vena cava is normal in size with greater than 50%  respiratory  variability, suggesting right atrial pressure of 3 mmHg.   11/2019 cath IMPRESSION: Ms. Redondo has normal left coronary system.  She has an apparent takeoff RCA from close to the left main takeoff.  I was unable to selectively intubate this from the right radial approach.  I did use 180 cc of contrast and I aborted the procedure.  She will need to return for RCA angiography via the right femoral approach.  The sheath was removed and a TR band was placed on the right wrist to achieve patent hemostasis.  The patient left lab in stable condition.  She will be discharged home today as an outpatient.   06/2020 event monitor Rare supraventricular ectopy. Rare short runs of SVT up to 12 beats. Rare ventricular ectopy Reported episode of "fainting" correlated with sinus rhythm at 60 bpm     Patch Wear Time:  13 days and 16 hours (2022-05-18T11:46:34-0400 to 2022-06-01T04:15:38-0400)   Patient had a min HR of 41 bpm, max HR of 174 bpm, and avg HR of 55 bpm. Predominant underlying rhythm was Sinus Rhythm. 27 Supraventricular Tachycardia runs occurred, the run with the fastest interval lasting 5 beats with a max rate of 174 bpm, the longest lasting 12 beats with an avg rate of 113 bpm. Isolated SVEs were rare (<1.0%), SVE Couplets were rare (<1.0%), and SVE Triplets were rare (<1.0%). Isolated VEs were rare (<1.0%), VE Couplets were rare (<1.0%), and no VE Triplets were present. Ventricular Bigeminy was present.    Assessment and Plan  1. Aflutter/atach -doing well without significant symptoms, continue current meds   2. HTN - elevated in clinic, she wll call with home numbers later in the week. - likely would consider chlorthalidone vs aldactone if additional bp med needed   3. Hyperipidemia - at goal, continue current meds   4. SOB - order echo      Arnoldo Lenis, M.D.

## 2021-02-24 NOTE — Patient Instructions (Signed)
Medication Instructions:  Continue all current medications.  Labwork: none  Testing/Procedures: Your physician has requested that you have an echocardiogram. Echocardiography is a painless test that uses sound waves to create images of your heart. It provides your doctor with information about the size and shape of your heart and how well your hearts chambers and valves are working. This procedure takes approximately one hour. There are no restrictions for this procedure. Office will contact with results via phone or letter.     Follow-Up: 6 months   Any Other Special Instructions Will Be Listed Below (If Applicable). Please call the office on Thursday with update on your BP readings.   If you need a refill on your cardiac medications before your next appointment, please call your pharmacy.

## 2021-02-24 NOTE — Addendum Note (Signed)
Addended by: Laurine Blazer on: 02/24/2021 11:48 AM   Modules accepted: Orders

## 2021-02-25 ENCOUNTER — Encounter: Payer: Self-pay | Admitting: Internal Medicine

## 2021-02-25 DIAGNOSIS — M79606 Pain in leg, unspecified: Secondary | ICD-10-CM

## 2021-02-26 NOTE — Progress Notes (Signed)
Subjective:    CC: R leg pain  I, Angela Simmons, LAT, ATC, am serving as scribe for Angela Simmons.  HPI: Pt is a 76 y/o female c/o R leg pain ongoing since 02/13/21, w/ worsening pain. She locates her pain to R posterior thigh and R posterior knee. Pt reports increases pain when trying to put pressure through her R leg. Pain radiates down the posterior leg from the buttocks to the posterior knee to the posterior calf.  Pain is bothersome at night as well.  Low back pain: did, but not currently R knee swelling: yes R knee mechanical symptoms: yes- catching Aggravating factors: walking, standing Treatments tried: heat; topical pain relievers; Tylenol  Diagnostic testing: B knee XR- 04/23/20  Pertinent review of Systems: No fevers or chills  Relevant historical information: Diabetes.  Polymyalgia rheumatica   Objective:    Vitals:   02/27/21 0922  BP: (!) 142/86  Pulse: 63  SpO2: 97%   General: Well Developed, well nourished, and in no acute distress.   MSK:  L-spine: Nontender midline. Decreased lumbar motion. Negative slump test right leg. Reflexes and sensation and strength are intact.  Right knee: Mild effusion otherwise normal-appearing Normal motion with crepitation. Tender palpation medial joint line. Stable ligamentous exam. Negative McMurray's test. Intact strength.  Lab and Radiology Results  Procedure: Real-time Ultrasound Guided Injection of right knee superior lateral patellar space Device: Philips Affiniti 50G Images permanently stored and available for review in PACS Ultrasound evaluation prior to injection reveals degenerative changes medial joint line.  No Baker's cyst. Verbal informed consent obtained.  Discussed risks and benefits of procedure. Warned about infection bleeding damage to structures skin hypopigmentation and fat atrophy among others. Patient expresses understanding and agreement Time-out conducted.   Noted no overlying  erythema, induration, or other signs of local infection.   Skin prepped in a sterile fashion.   Local anesthesia: Topical Ethyl chloride.   With sterile technique and under real time ultrasound guidance: 40 mg of Kenalog and 2 mL of Marcaine injected into the knee joint. Fluid seen entering the joint capsule.   Completed without difficulty   Pain immediately resolved suggesting accurate placement of the medication.   Advised to call if fevers/chills, erythema, induration, drainage, or persistent bleeding.   Images permanently stored and available for review in the ultrasound unit.  Impression: Technically successful ultrasound guided injection.   X-ray images right knee and L-spine obtained today personally and independently interpreted  Right knee: Mild to moderate medial and lateral DJD.  Mild to moderate patellofemoral DJD.  No acute fractures.  Decreased bone mineral density present.  L-spine: Diffuse DDD and facet DJD L5-S1 and L4-L5.  Await formal radiology review     Impression and Recommendations:    Assessment and Plan: 76 y.o. female with  Right posterior knee pain associated with right posterior leg pain.  Etiology is somewhat challenging to understand.  I think is likely that she has both knee pain and potential S1 lumbosacral radiculopathy.  We will treat for both with knee injection today to treat for knee pain thought to be due to DJD and for lumbar radiculopathy with low-dose gabapentin at bedtime and if not improved on Monday, February 13 with oral prednisone.  [name] would like to avoid a course of oral prednisone now which I think is reasonable.  PDMP not reviewed this encounter. Orders Placed This Encounter  Procedures   [place] LIMITED JOINT SPACE STRUCTURES LOW RIGHT(NO LINKED CHARGES)  Standing Status:   Future    Number of Occurrences:   1    Standing Expiration Date:   08/27/2021    Order Specific Question:   Reason for Exam (SYMPTOM  OR DIAGNOSIS REQUIRED)     Answer:   right knee pain    Order Specific Question:   Preferred imaging location?    Answer:   [facility]   DG Knee AP/LAT W/Sunrise Right    Standing Status:   Future    Number of Occurrences:   1    Standing Expiration Date:   02/27/2022    Order Specific Question:   Reason for Exam (SYMPTOM  OR DIAGNOSIS REQUIRED)    Answer:   right knee pain    Order Specific Question:   Preferred imaging location?    Answer:   [facility]   DG Lumbar Spine 2-3 Views    Standing Status:   Future    Number of Occurrences:   1    Standing Expiration Date:   02/27/2022    Order Specific Question:   Reason for Exam (SYMPTOM  OR DIAGNOSIS REQUIRED)    Answer:   radicular symptoms    Order Specific Question:   Preferred imaging location?    Answer:   [facility]   Meds ordered this encounter  Medications   DISCONTD: gabapentin (NEURONTIN) 300 MG capsule    Sig: Take 1 capsule (300 mg total) by mouth 3 (three) times daily.    Dispense:  30 capsule    Refill:  2   gabapentin (NEURONTIN) 100 MG capsule    Sig: Take 1 capsule (100 mg total) by mouth at bedtime.    Dispense:  30 capsule    Refill:  3    Discussed warning signs or symptoms. Please see discharge instructions. Patient expresses understanding.   The above documentation has been reviewed and is accurate and complete Lynne Simmons, M.D.

## 2021-02-26 NOTE — Progress Notes (Signed)
Office Visit Note  Patient: Angela Simmons             Date of Birth: 11-04-1945           MRN: [id number]             PCP: Binnie Rail, MD Referring: Binnie Rail, MD Visit Date: 02/27/2021 Occupation: @GUAROCC @  Subjective:  Pain in right knee and right hip  History of Present Illness: Angela Simmons is a 76 y.o. female with history of Sjogren's, polymyalgia rheumatica, osteoarthritis and degenerative disc disease.  She states she has been having pain and discomfort in multiple joints.  She states she has neck pain and stiffness which radiates into her right arm.  She also has been experiencing lower back pain, right hip and right knee pain.  She was seen by Dr. Georgina Snell today.  She had ultrasound-guided right knee joint injection.  She is hoping that will improve her symptoms.  She denies any muscular weakness.  She has been taking hydroxychloroquine on a regular basis.  She continues to have dry mouth and dry eyes symptoms for which she has been using over-the-counter products.  There is no history of joint swelling.  Activities of Daily Living:  Patient reports morning stiffness for 3 hours.   Patient Reports nocturnal pain.  Difficulty dressing/grooming: Denies Difficulty climbing stairs: Reports Difficulty getting out of chair: Reports Difficulty using hands for taps, buttons, cutlery, and/or writing: Reports  Review of Systems  Constitutional:  Positive for fatigue.  HENT:  Positive for mouth dryness. Negative for mouth sores and nose dryness.   Eyes:  Positive for dryness. Negative for pain and itching.  Respiratory:  Negative for shortness of breath and difficulty breathing.   Cardiovascular:  Negative for chest pain and palpitations.  Gastrointestinal:  Positive for constipation. Negative for blood in stool and diarrhea.  Endocrine: Negative for increased urination.  Genitourinary:  Negative for difficulty urinating.  Musculoskeletal:  Positive for joint pain, joint  pain, joint swelling, myalgias, morning stiffness, muscle tenderness and myalgias.  Skin:  Negative for color change, rash and redness.  Allergic/Immunologic: Negative for susceptible to infections.  Neurological:  Positive for dizziness, numbness, headaches, memory loss and weakness.  Hematological:  Positive for bruising/bleeding tendency.  Psychiatric/Behavioral:  Positive for confusion. Negative for depressed mood. The patient is not nervous/anxious.    PMFS History:  Patient Active Problem List   Diagnosis Date Noted   Cervical radiculopathy due to degenerative joint disease of spine 02/13/2021   Right-sided chest pain 02/13/2021   Bilateral primary osteoarthritis of knee 02/13/2021   Right knee pain 01/03/2021   Radiculopathy 01/03/2021   Atrial flutter ([place]) 08/22/2020   Aortic atherosclerosis ([place]) 06/28/2020   Secondary hypercoagulable state ([place]) 12/26/2019   Anticoagulated 12/05/2019   PAF (paroxysmal atrial fibrillation) ([place]) 08/04/2019   Thrush 08/02/2019   Community acquired pneumonia 07/22/2019   DOE (dyspnea on exertion) 04/03/2019   Ankle swelling, left 04/03/2019   Hyperpigmented skin lesion 04/03/2019   Poor balance 12/06/2018   Headache 12/06/2018   Vertigo 06/07/2018   Urinary frequency 06/07/2018   Lower abdominal pain 06/07/2018   Herpes zoster without complication 70/35/0093   Tingling in extremities 11/26/2017   Ischial bursitis of left side 06/02/2017   Sicca syndrome ([place]) 06/23/2016   Bilateral leg edema 04/07/2016   Burning sensation of skin 04/07/2016   PMR (polymyalgia rheumatica) ([place]) 03/27/2016   Osteopenia 03/04/2016   Autoimmune disease ([place]) 03/18/2015   Inflammatory  arthritis 03/18/2015   Joint pain 11/22/2014   Overweight (BMI 25.0-29.9) 08/18/2014   Low back pain with radiation 08/18/2014   Hyperlipidemia 06/28/2014   Chest pain with moderate risk of acute coronary syndrome 04/11/2014   Tendonitis of shoulder 08/29/2013   S/P  right mastectomy 11/14/2012   Type 2 diabetes, controlled, with neuropathy ([place]) 10/05/2012   Allergic rhinitis 01/26/2011   GERD 11/10/2009   INTERSTITIAL CYSTITIS 10/31/2008   BENIGN NEOPLASM OF VULVA 02/01/2008   Essential hypertension, benign 02/08/2007   History of cancer of right breast 02/08/2007    Past Medical History:  Diagnosis Date   Breast cancer ([place]) 1990s   right breast   Collagen vascular disease ([place])    Diabetes mellitus without complication ([place])    GERD (gastroesophageal reflux disease)    HTN (hypertension)    Hypercholesterolemia    Obesity    Osteoarthritis     Family History  Problem Relation Age of Onset   Diabetes Mother        diabetic coma   Heart failure Mother    Diabetes Father    Emphysema Father    Diabetes Sister    Hypercholesterolemia Sister    Hypercholesterolemia Brother    Diabetes Brother    Hypertension Brother    Hypercholesterolemia Brother    Diabetes Sister    Hypertension Sister    Hypercholesterolemia Sister    Hypercholesterolemia Sister    Hypercholesterolemia Sister    Diabetes Brother    Hypertension Brother    Colon cancer Neg Hx    Past Surgical History:  Procedure Laterality Date   ABDOMINAL HYSTERECTOMY  1993   [place], [place]   AORTIC ARCH ANGIOGRAPHY N/A 12/07/2019   Procedure: AORTIC ARCH ANGIOGRAPHY;  Surgeon: Lorretta Harp, MD;  Location: [facility];  Service: Cardiovascular;  Laterality: N/A;   COLONOSCOPY  May 2003   Dr. Tamala Julian: normal   COLONOSCOPY  06/22/2011   Procedure: COLONOSCOPY;  Surgeon: Danie Binder, MD;  [facility];  Service: Endoscopy;  Laterality: N/A;  10:30   LEFT HEART CATH AND CORONARY ANGIOGRAPHY N/A 12/07/2019   Procedure: LEFT HEART CATH AND CORONARY ANGIOGRAPHY;  Surgeon: Lorretta Harp, MD;  Location: [facility];  Service: Cardiovascular;  Laterality: N/A;   MASTECTOMY  1995   right    Social History   Social History Narrative   Lives at  home with husband   Caffeine, maybe 1 a day   Immunization History  Administered Date(s) Administered   Fluad Quad(high Dose 65+) 12/06/2018, 12/27/2019, 01/03/2021   Influenza Split 11/14/2010   Influenza Whole 10/18/2006, 10/31/2008, 11/06/2009   Influenza, High Dose Seasonal PF 11/22/2014, 02/21/2016, 12/18/2016, 11/05/2017   Influenza,inj,Quad PF,6+ Mos 11/14/2012, 11/23/2013   Moderna Sars-Covid-2 Vaccination 03/16/2019, 04/13/2019   Pneumococcal Conjugate-13 04/11/2014   Pneumococcal Polysaccharide-23 09/17/2011   Td 07/05/2003   Zoster Recombinat (Shingrix) 11/30/2017, 02/02/2018   Zoster, Live 06/15/2013     Objective: Vital Signs: BP (!) 162/80 (BP Location: Left Arm, Patient Position: Sitting, Cuff Size: Large)    Pulse (!) 55    Ht 5\' 9"  (1.753 m)    Wt 188 lb (85.3 kg)    BMI 27.76 kg/m    Physical Exam Vitals and nursing note reviewed.  Constitutional:      Appearance: She is well-developed.  HENT:     Head: Normocephalic and atraumatic.  Eyes:     Conjunctiva/sclera: Conjunctivae normal.  Cardiovascular:     Rate  and Rhythm: Normal rate and regular rhythm.     Heart sounds: Normal heart sounds.  Pulmonary:     Effort: Pulmonary effort is normal.     Breath sounds: Normal breath sounds.  Abdominal:     General: Bowel sounds are normal.     Palpations: Abdomen is soft.  Musculoskeletal:     Cervical back: Normal range of motion.  Lymphadenopathy:     Cervical: No cervical adenopathy.  Skin:    General: Skin is warm and dry.     Capillary Refill: Capillary refill takes less than 2 seconds.  Neurological:     Mental Status: She is alert and oriented to person, place, and time.  Psychiatric:        Behavior: Behavior normal.     Musculoskeletal Exam: She had limited painful range of motion of her cervical spine.  She had discomfort range of motion of her lumbar spine.  Shoulder joints, elbow joints, wrist joints with good range of motion.  She had  bilateral PIP and DIP thickening.  Hip joints in good range of motion.  She had tenderness on palpation of her right trochanteric bursa.  She has some warmth on palpation of her right knee joint.  Left knee joint was in full range of motion without any warmth swelling or effusion.  There was no tenderness over ankles or MTPs.  CDAI Exam: CDAI Score: -- Patient Global: --; Provider Global: -- Swollen: --; Tender: -- Joint Exam 02/27/2021   No joint exam has been documented for this visit   There is currently no information documented on the homunculus. Go to the Rheumatology activity and complete the homunculus joint exam.  Investigation: No additional findings.  Imaging: DG Chest 2 View  Result Date: 02/14/2021 CLINICAL DATA:  76 year old female with right-sided chest pain EXAM: CHEST - 2 VIEW COMPARISON:  08/22/2019 FINDINGS: Cardiomediastinal silhouette unchanged in size and contour. No evidence of central vascular congestion. No interlobular septal thickening. No pneumothorax or pleural effusion. Coarsened interstitial markings, with no confluent airspace disease. No acute displaced fracture. Degenerative changes of the spine. Surgical changes of the right chest wall IMPRESSION: No active cardiopulmonary disease. Electronically Signed   By: Corrie Mckusick D.O.   On: 02/14/2021 09:09   DG Cervical Spine Complete  Result Date: 02/14/2021 CLINICAL DATA:  Neck pain EXAM: CERVICAL SPINE - COMPLETE 4+ VIEW COMPARISON:  01/26/2018 FINDINGS: No recent fracture is seen. Alignment of posterior margins of vertebral bodies is unremarkable. There is no significant disc space narrowing. There is calcification in the anterior spinal ligament and small bony spurs from C4-C7 levels. In the oblique views, right neural foramina are not optimally visualized. There is possible encroachment of right neural foramina at C3-C4 and C5-C6 levels. Prevertebral soft tissues are unremarkable. IMPRESSION: No recent  fracture is seen in the cervical spine. Cervical spondylosis. Possible encroachment of right neural foramina at C3-C4 and C5-C6 levels. Electronically Signed   By: Elmer Picker M.D.   On: 02/14/2021 20:32    Recent Labs: Lab Results  Component Value Date   WBC 3.4 (L) 11/11/2020   HGB 12.0 11/11/2020   PLT 205 11/11/2020   NA 139 01/03/2021   K 4.0 01/03/2021   CL 104 01/03/2021   CO2 30 01/03/2021   GLUCOSE 79 01/03/2021   BUN 14 01/03/2021   CREATININE 1.07 01/03/2021   BILITOT 0.5 01/03/2021   ALKPHOS 62 01/03/2021   AST 18 01/03/2021   ALT 13 01/03/2021   PROT  7.5 01/03/2021   ALBUMIN 3.8 01/03/2021   CALCIUM 9.6 01/03/2021   GFRAA 72 04/23/2020   QFTBGOLDPLUS Negative 07/25/2019    Speciality Comments: PLQ EYe Exam: 11/19/2020 WNL @ [facility] [place] up in 6 months  Procedures:  No procedures performed Allergies: Levofloxacin, Nitrofurantoin, Penicillins, Sulfonamide derivatives, and Pantoprazole   Assessment / Plan:     Visit Diagnoses: Sjogren's syndrome with keratoconjunctivitis sicca (HCC) - ANA+, sicca symptoms: She continues to have dry mouth and dry eyes.  She has been using over-the-counter products which has been helpful.  PMR (polymyalgia rheumatica) (HCC) - In remission.   High risk medication use - Plaquenil 200 mg 1 tablet by mouth daily Monday to Friday. PLQ Eye Exam: November 19, 2020.  Labs obtained on November 11, 2020 showed mild neutropenia which is a stable.  CMP was normal on January 03, 2021.  She has been advised to get labs in April.  Paresthesia of both hands -she complains of paresthesias in her both hands off and on.  She states she has been having radiculopathy in her right arm.  I reviewed x-rays of her cervical spine which showed some degenerative changes in nerve impingement.  A handout on neck exercises was given  Trochanteric bursitis of both hips-she had tenderness over bilateral trochanteric bursa more prominent on the right  side.  She has exercises on IT band stretches which were emphasized.  Primary osteoarthritis of both knees - X-rays were consistent with bilateral moderate osteoarthritis and chondromalacia patella on 04/23/2020.  She had x-rays of her right knee joint by Dr. Georgina Snell today.  I reviewed his office visit note.  She also had an ultrasound-guided cortisone injection.  I have encouraged her to do lower extremity muscle strengthening exercises once her symptoms improve.  DDD (degenerative disc disease), cervical-x-rays were reviewed and the findings were discussed with the patient.  Chronic midline low back pain with right-sided sciatica-she has been experiencing lower back pain.  I reviewed the x-rays done by Dr. Georgina Snell which showed degenerative changes and facet joint arthropathy.  Osteopenia of multiple sites - DEXA updated on 08/26/20 (ordered by Dr. Quay Burow): LFN T score -1.4.  -3.8% change in BMD from previous DEXA.   History of hypertension  History of hyperlipidemia  Other fatigue  History of peripheral neuropathy  History of cancer of right breast  S/P right mastectomy  History of gastroesophageal reflux (GERD)  Orders: No orders of the defined types were placed in this encounter.  No orders of the defined types were placed in this encounter.   Follow-Up Instructions: Return in about 5 months (around 07/27/2021) for Sjogren's, Polymyalgia rheumatica, Osteoarthritis.   Bo Merino, MD  Note - This record has been created using Editor, commissioning.  Chart creation errors have been sought, but may not always  have been located. Such creation errors do not reflect on  the standard of medical care.

## 2021-02-27 ENCOUNTER — Ambulatory Visit: Payer: Medicare HMO | Admitting: Rheumatology

## 2021-02-27 ENCOUNTER — Ambulatory Visit: Payer: Self-pay

## 2021-02-27 ENCOUNTER — Other Ambulatory Visit: Payer: Self-pay

## 2021-02-27 ENCOUNTER — Ambulatory Visit (INDEPENDENT_AMBULATORY_CARE_PROVIDER_SITE_OTHER): Payer: Medicare HMO | Admitting: Family Medicine

## 2021-02-27 ENCOUNTER — Encounter: Payer: Self-pay | Admitting: Rheumatology

## 2021-02-27 ENCOUNTER — Ambulatory Visit (INDEPENDENT_AMBULATORY_CARE_PROVIDER_SITE_OTHER): Payer: Medicare HMO

## 2021-02-27 VITALS — BP 142/86 | HR 63 | Ht 69.0 in | Wt 188.5 lb

## 2021-02-27 VITALS — BP 162/80 | HR 55 | Ht 69.0 in | Wt 188.0 lb

## 2021-02-27 DIAGNOSIS — B351 Tinea unguium: Secondary | ICD-10-CM | POA: Diagnosis not present

## 2021-02-27 DIAGNOSIS — Z8679 Personal history of other diseases of the circulatory system: Secondary | ICD-10-CM

## 2021-02-27 DIAGNOSIS — M25561 Pain in right knee: Secondary | ICD-10-CM

## 2021-02-27 DIAGNOSIS — M1711 Unilateral primary osteoarthritis, right knee: Secondary | ICD-10-CM | POA: Diagnosis not present

## 2021-02-27 DIAGNOSIS — G8929 Other chronic pain: Secondary | ICD-10-CM

## 2021-02-27 DIAGNOSIS — M8589 Other specified disorders of bone density and structure, multiple sites: Secondary | ICD-10-CM | POA: Diagnosis not present

## 2021-02-27 DIAGNOSIS — M5030 Other cervical disc degeneration, unspecified cervical region: Secondary | ICD-10-CM

## 2021-02-27 DIAGNOSIS — R202 Paresthesia of skin: Secondary | ICD-10-CM | POA: Diagnosis not present

## 2021-02-27 DIAGNOSIS — Z8669 Personal history of other diseases of the nervous system and sense organs: Secondary | ICD-10-CM

## 2021-02-27 DIAGNOSIS — M79676 Pain in unspecified toe(s): Secondary | ICD-10-CM | POA: Diagnosis not present

## 2021-02-27 DIAGNOSIS — M5416 Radiculopathy, lumbar region: Secondary | ICD-10-CM

## 2021-02-27 DIAGNOSIS — E1142 Type 2 diabetes mellitus with diabetic polyneuropathy: Secondary | ICD-10-CM | POA: Diagnosis not present

## 2021-02-27 DIAGNOSIS — M7062 Trochanteric bursitis, left hip: Secondary | ICD-10-CM

## 2021-02-27 DIAGNOSIS — Z9011 Acquired absence of right breast and nipple: Secondary | ICD-10-CM

## 2021-02-27 DIAGNOSIS — M2578 Osteophyte, vertebrae: Secondary | ICD-10-CM | POA: Diagnosis not present

## 2021-02-27 DIAGNOSIS — Z8719 Personal history of other diseases of the digestive system: Secondary | ICD-10-CM

## 2021-02-27 DIAGNOSIS — Z79899 Other long term (current) drug therapy: Secondary | ICD-10-CM | POA: Diagnosis not present

## 2021-02-27 DIAGNOSIS — Z8639 Personal history of other endocrine, nutritional and metabolic disease: Secondary | ICD-10-CM

## 2021-02-27 DIAGNOSIS — L84 Corns and callosities: Secondary | ICD-10-CM | POA: Diagnosis not present

## 2021-02-27 DIAGNOSIS — M353 Polymyalgia rheumatica: Secondary | ICD-10-CM | POA: Diagnosis not present

## 2021-02-27 DIAGNOSIS — M7061 Trochanteric bursitis, right hip: Secondary | ICD-10-CM | POA: Diagnosis not present

## 2021-02-27 DIAGNOSIS — M3501 Sicca syndrome with keratoconjunctivitis: Secondary | ICD-10-CM

## 2021-02-27 DIAGNOSIS — M5441 Lumbago with sciatica, right side: Secondary | ICD-10-CM | POA: Diagnosis not present

## 2021-02-27 DIAGNOSIS — M170 Bilateral primary osteoarthritis of knee: Secondary | ICD-10-CM

## 2021-02-27 DIAGNOSIS — Z853 Personal history of malignant neoplasm of breast: Secondary | ICD-10-CM

## 2021-02-27 DIAGNOSIS — M545 Low back pain, unspecified: Secondary | ICD-10-CM | POA: Diagnosis not present

## 2021-02-27 DIAGNOSIS — R5383 Other fatigue: Secondary | ICD-10-CM

## 2021-02-27 MED ORDER — GABAPENTIN 100 MG PO CAPS: 100.0000 mg | ORAL_CAPSULE | Freq: Every day | ORAL | 3 refills | Status: DC

## 2021-02-27 MED ORDER — GABAPENTIN 300 MG PO CAPS: 300.0000 mg | ORAL_CAPSULE | Freq: Three times a day (TID) | ORAL | 2 refills | Status: DC

## 2021-02-27 NOTE — Patient Instructions (Addendum)
Standing Labs We placed an order today for your standing lab work.   Please have your standing labs drawn in April and every 5 months  If possible, please have your labs drawn 2 weeks prior to your appointment so that the provider can discuss your results at your appointment.  Please note that you may see your imaging and lab results in [facility] before we have reviewed them. We may be awaiting multiple results to interpret others before contacting you. Please allow our office up to 72 hours to thoroughly review all of the results before contacting the office for clarification of your results.  We have open lab daily: Monday through Thursday from 1:30-4:30 PM and Friday from 1:30-4:00 PM at the office of Dr. Bo Merino, [facility].   Please be advised, all patients with office appointments requiring lab work will take precedent over walk-in lab work.  If possible, please come for your lab work on Monday and Friday afternoons, as you may experience shorter wait times. The office is located at [address] [place], [place], [place] [address] No appointment is necessary.   Labs are drawn by [facility]. Please bring your co-pay at the time of your lab draw.  You may receive a bill from [facility] for your lab work.  Please note if you are on Hydroxychloroquine and and an order has been placed for a Hydroxychloroquine level, you will need to have it drawn 4 hours or more after your last dose.  If you wish to have your labs drawn at another location, please call the office 24 hours in advance to send orders.  If you have any questions regarding directions or hours of operation,  please call [phone].   As a reminder, please drink plenty of water prior to coming for your lab work. Thanks!    Vaccines You are taking a medication(s) that can suppress your immune system.  The following immunizations are recommended: Flu annually Covid-19  Td/Tdap (tetanus, diphtheria,  pertussis) every 10 years Pneumonia (Prevnar 15 then Pneumovax 23 at least 1 year apart.  Alternatively, can take Prevnar 20 without needing additional dose) Shingrix: 2 doses from 4 weeks to 6 months apart  Please check with your PCP to make sure you are up to date.   Cervical Strain and Sprain Rehab Ask your health care provider which exercises are safe for you. Do exercises exactly as told by your health care provider and adjust them as directed. It is normal to feel mild stretching, pulling, tightness, or discomfort as you do these exercises. Stop right away if you feel sudden pain or your pain gets worse. Do not begin these exercises until told by your health care provider. Stretching and range-of-motion exercises Cervical side bending  Using good posture, sit on a stable chair or stand up. Without moving your shoulders, slowly tilt your left / right ear to your shoulder until you feel a stretch in the opposite side neck muscles. You should be looking straight ahead. Hold for __________ seconds. Repeat with the other side of your neck. Repeat __________ times. Complete this exercise __________ times a day. Cervical rotation  Using good posture, sit on a stable chair or stand up. Slowly turn your head to the side as if you are looking over your left / right shoulder. Keep your eyes level with the ground. Stop when you feel a stretch along the side and the back of your neck. Hold for __________ seconds. Repeat this by turning to your  other side. Repeat __________ times. Complete this exercise __________ times a day. Thoracic extension and pectoral stretch Roll a towel or a small blanket so it is about 4 inches (10 cm) in diameter. Lie down on your back on a firm surface. Put the towel lengthwise, under your spine in the middle of your back. It should not be under your shoulder blades. The towel should line up with your spine from your middle back to your lower back. Put your hands  behind your head and let your elbows fall out to your sides. Hold for __________ seconds. Repeat __________ times. Complete this exercise __________ times a day. Strengthening exercises Isometric upper cervical flexion Lie on your back with a thin pillow behind your head and a small rolled-up towel under your neck. Gently tuck your chin toward your chest and nod your head down to look toward your feet. Do not lift your head off the pillow. Hold for __________ seconds. Release the tension slowly. Relax your neck muscles completely before you repeat this exercise. Repeat __________ times. Complete this exercise __________ times a day. Isometric cervical extension  Stand about 6 inches (15 cm) away from a wall, with your back facing the wall. Place a soft object, about 6-8 inches (15-20 cm) in diameter, between the back of your head and the wall. A soft object could be a small pillow, a ball, or a folded towel. Gently tilt your head back and press into the soft object. Keep your jaw and forehead relaxed. Hold for __________ seconds. Release the tension slowly. Relax your neck muscles completely before you repeat this exercise. Repeat __________ times. Complete this exercise __________ times a day. Posture and body mechanics Body mechanics refers to the movements and positions of your body while you do your daily activities. Posture is part of body mechanics. Good posture and healthy body mechanics can help to relieve stress in your body's tissues and joints. Good posture means that your spine is in its natural S-curve position (your spine is neutral), your shoulders are pulled back slightly, and your head is not tipped forward. The following are general guidelines for applying improved posture and body mechanics to your everyday activities. Sitting  When sitting, keep your spine neutral and keep your feet flat on the floor. Use a footrest, if necessary, and keep your thighs parallel to the floor.  Avoid rounding your shoulders, and avoid tilting your head forward. When working at a desk or a computer, keep your desk at a height where your hands are slightly lower than your elbows. Slide your chair under your desk so you are close enough to maintain good posture. When working at a computer, place your monitor at a height where you are looking straight ahead and you do not have to tilt your head forward or downward to look at the screen. Standing  When standing, keep your spine neutral and keep your feet about hip-width apart. Keep a slight bend in your knees. Your ears, shoulders, and hips should line up. When you do a task in which you stand in one place for a long time, place one foot up on a stable object that is 2-4 inches (5-10 cm) high, such as a footstool. This helps keep your spine neutral. Resting When lying down and resting, avoid positions that are most painful for you. Try to support your neck in a neutral position. You can use a contour pillow or a small rolled-up towel. Your pillow should support your neck but  not push on it. This information is not intended to replace advice given to you by your health care provider. Make sure you discuss any questions you have with your health care provider. Document Revised: 04/27/2018 Document Reviewed: 10/06/2017 Elsevier Patient Education  [place].

## 2021-02-27 NOTE — Patient Instructions (Addendum)
Thank you for coming in today.   I've prescribed you Gabapentin to take at bedtime.  Please get an Xray today before you leave   You received an injection today. Seek immediate medical attention if the joint becomes red, extremely painful, or is oozing fluid.   Recheck back in 6-8 weeks

## 2021-02-28 ENCOUNTER — Ambulatory Visit: Payer: PPO | Admitting: Rheumatology

## 2021-02-28 ENCOUNTER — Encounter: Payer: Self-pay | Admitting: Cardiology

## 2021-02-28 DIAGNOSIS — Z8639 Personal history of other endocrine, nutritional and metabolic disease: Secondary | ICD-10-CM

## 2021-02-28 DIAGNOSIS — M353 Polymyalgia rheumatica: Secondary | ICD-10-CM

## 2021-02-28 DIAGNOSIS — M7061 Trochanteric bursitis, right hip: Secondary | ICD-10-CM

## 2021-02-28 DIAGNOSIS — Z79899 Other long term (current) drug therapy: Secondary | ICD-10-CM

## 2021-02-28 DIAGNOSIS — R202 Paresthesia of skin: Secondary | ICD-10-CM

## 2021-02-28 DIAGNOSIS — G8929 Other chronic pain: Secondary | ICD-10-CM

## 2021-02-28 DIAGNOSIS — M170 Bilateral primary osteoarthritis of knee: Secondary | ICD-10-CM

## 2021-02-28 DIAGNOSIS — M8589 Other specified disorders of bone density and structure, multiple sites: Secondary | ICD-10-CM

## 2021-02-28 DIAGNOSIS — R5383 Other fatigue: Secondary | ICD-10-CM

## 2021-02-28 DIAGNOSIS — Z9011 Acquired absence of right breast and nipple: Secondary | ICD-10-CM

## 2021-02-28 DIAGNOSIS — Z8669 Personal history of other diseases of the nervous system and sense organs: Secondary | ICD-10-CM

## 2021-02-28 DIAGNOSIS — Z853 Personal history of malignant neoplasm of breast: Secondary | ICD-10-CM

## 2021-02-28 DIAGNOSIS — Z8719 Personal history of other diseases of the digestive system: Secondary | ICD-10-CM

## 2021-02-28 DIAGNOSIS — Z8679 Personal history of other diseases of the circulatory system: Secondary | ICD-10-CM

## 2021-02-28 DIAGNOSIS — M3501 Sicca syndrome with keratoconjunctivitis: Secondary | ICD-10-CM

## 2021-03-02 NOTE — Telephone Encounter (Signed)
BP's up and down to some degree, overall they are ok at this time, no med changes   Zandra Abts MD

## 2021-03-03 ENCOUNTER — Encounter: Payer: Self-pay | Admitting: Family Medicine

## 2021-03-03 NOTE — Progress Notes (Signed)
Right knee x-ray shows medium arthritis

## 2021-03-03 NOTE — Progress Notes (Signed)
Lumbar spine x-ray shows some arthritis changes present in the low back.

## 2021-03-04 ENCOUNTER — Telehealth: Payer: Self-pay

## 2021-03-04 NOTE — Telephone Encounter (Signed)
Moving the MyChart message to a phone call message.  Please contact patient.  I do not think gabapentin will cause [name] to use the bathroom more. It should not affect her kidneys either.   Here is the context of the MyChart messages. Thank you.  I will give it a try and see how it does. Will it cause you to use the bathroom alot. She's asking about any harm to kidney being on this med.  Someone can follow with her by phone.  I don't check Mychart regularly.     You  Angela Simmons [place] 21 hours ago (3:01 PM)   I would recommend increasing the gabapentin.   Try taking 2 or 3 at a time at bedtime.  The typical starting dose of gabapentin is usually 300 mg.  I was being very cautious by giving you 100 mg at bedtime.   The max dose of gabapentin is 900 mg 3 times a day so you are on a very small dose

## 2021-03-04 NOTE — Chronic Care Management (AMB) (Signed)
Chronic Care Management Pharmacy Assistant   Name: Angela Simmons  MRN: [id number] DOB: Dec 26, 1945  Angela Simmons is an 76 y.o. year old female who presents for his follow-up CCM visit with the clinical pharmacist.  Reason for Encounter: Disease State   Conditions to be addressed/monitored: HTN  Recent office visits:  02/13/21 Binnie Rail, MD-PCP (cervical radiculopathy due to degenerative joint disease of spine)Xrays were ordered.  Medications changes include :  none  Recent consult visits:  02/27/21 Bo Merino, MD-Rheumatology ( Sjogren's, polymyalgia rheumatica, osteoarthritis and degenerative disc disease) Blood work ordered, no med changes  02/27/21 Gregor Hams, MD-Sports Medicine (Chronic pain right knee) Medication changes: discontinue gabapentin 300 mg to gabapentin 100 mg at bedtime  02/24/21 Arnoldo Lenis, MD-Cardiology ([place]) No orders or med changes  Hospital visits:  None since last coordination call  Medications: Outpatient Encounter Medications as of 03/04/2021  Medication Sig   apixaban (ELIQUIS) 5 MG TABS tablet Take 1 tablet (5 mg total) by mouth 2 (two) times daily.   atorvastatin (LIPITOR) 10 MG tablet TAKE 1 TABLET BY MOUTH DAILY   benazepril (LOTENSIN) 40 MG tablet TAKE 1 TABLET BY MOUTH DAILY   Blood Glucose Monitoring Suppl (ONETOUCH VERIO) w/Device KIT USE TO TEST BLOOD SUGAR ONCE DAILY AS DIRECTED   diclofenac sodium (VOLTAREN) 1 % GEL Apply 3 g to 3 large joints up to 3 times daily.   DILT-XR 120 MG 24 hr capsule TAKE 1 CAPSULE BY MOUTH EVERY DAY   fluticasone (FLONASE) 50 MCG/ACT nasal spray Place 1 spray into both nostrils as needed for allergies or rhinitis.   gabapentin (NEURONTIN) 100 MG capsule Take 1 capsule (100 mg total) by mouth at bedtime.   Ginger, Zingiber officinalis, 550 MG CAPS Take 1 capsule (550 mg total) by mouth daily.   glucose blood test strip USE TO TEST BLOOD SUGAR ONCE DAILY. E11.40   hydroxychloroquine  (PLAQUENIL) 200 MG tablet Take 1 tablet (200 mg total) by mouth daily.   loratadine (CLARITIN) 10 MG tablet TAKE 1 TABLET BY MOUTH EVERY DAY   metoprolol tartrate (LOPRESSOR) 25 MG tablet TAKE 1 TABLET BY MOUTH TWICE DAILY   Multiple Vitamins-Minerals (CENTRUM SILVER 50+WOMEN) TABS Take 1 tablet by mouth daily at 6 (six) AM.   nitroGLYCERIN (NITROSTAT) 0.4 MG SL tablet Place 1 tablet (0.4 mg total) under the tongue every 5 (five) minutes as needed for chest pain.   Omega-3 Fatty Acids (FISH OIL) 1000 MG CAPS Take 1,000 mg by mouth daily.   omeprazole (PRILOSEC) 40 MG capsule TAKE ONE CAPSULE BY MOUTH DAILY   ONETOUCH DELICA LANCETS 92J MISC USE TO TEST ONCE DAILY   ONETOUCH VERIO test strip USE TO TEST BLOOD SUGAR ONCE DAILY   Turmeric (QC TUMERIC COMPLEX) 500 MG CAPS Take 1 capsule by mouth daily.   No facility-administered encounter medications on file as of 03/04/2021.   Reviewed chart prior to disease state call. Spoke with patient regarding BP  Recent Office Vitals: BP Readings from Last 3 Encounters:  02/27/21 (!) 162/80  02/27/21 (!) 142/86  02/24/21 140/80   Pulse Readings from Last 3 Encounters:  02/27/21 (!) 55  02/27/21 63  02/24/21 65    Wt Readings from Last 3 Encounters:  02/27/21 188 lb (85.3 kg)  02/27/21 188 lb 8 oz (85.5 kg)  02/24/21 188 lb (85.3 kg)     Kidney Function Lab Results  Component Value Date/Time   CREATININE 1.07 01/03/2021 11:44 AM  CREATININE 1.12 (H) 09/25/2020 11:11 AM   CREATININE 0.96 06/28/2020 12:48 PM   CREATININE 0.92 04/23/2020 11:44 AM   GFR 51.13 (L) 01/03/2021 11:44 AM   GFRNONAA 62 04/23/2020 11:44 AM   GFRAA 72 04/23/2020 11:44 AM    BMP Latest Ref Rng & Units 01/03/2021 09/25/2020 06/28/2020  Glucose 70 - 99 mg/dL 79 118(H) 80  BUN 6 - 23 mg/dL _0 Creatinine 0.40 - 1.20 mg/dL 1.07 1.12(H) 0.96  BUN/Creat Ratio 6 - 22 (calc) - 12 -  Sodium 135 - 145 mEq/L 139 141 140  Potassium 3.5 - 5.1 mEq/L 4.0 4.2 4.0  Chloride  96 - 112 mEq/L 104 106 103  CO2 19 - 32 mEq/L _1 Calcium 8.4 - 10.5 mg/dL 9.6 9.1 9.4    Current antihypertensive regimen:  Benazepril 40 mg Metoprolol tartrate 25 mg  How often are you checking your Blood Pressure? daily patient mostly checks blood pressure in the evenings and sometimes she may check in the mornings when she gets up   Current home BP readings: Patient states her recent reading are 116/78 this morning, 122/65 yesterday  and 131/64 day before   What recent interventions/DTPs have been made by any provider to improve Blood Pressure control since last CPP Visit: continue blood pressure medication per Dr. Quay Burow  Any recent hospitalizations or ED visits since last visit with CPP? No  What diet changes have been made to improve Blood Pressure Control?  Patient states that she does not add salt to her food, and does not eat what she is not suppose to. Trying to get in more fruits and vegetables when she is able  What exercise is being done to improve your Blood Pressure Control?  Patient states that she does chair exercise staying busy with house work  Adherence Review: Is the patient currently on ACE/ARB medication? Yes Does the patient have >5 day gap between last estimated fill dates? No    Care Gaps: Colonoscopy-06/22/11 Diabetic Foot Exam-06/21/19 Mammogram-03/20/20 Ophthalmology-01/10/20 Dexa Scan - 08/26/20 Annual Well Visit - NA Micro albumin-NA Hemoglobin A1c- 01/03/21  Star Rating Drugs: Atorvastatin 10 mg-last fill 11/04/20 90 ds Benazepril 40 mg-last fill 01/25/21 90 ds   Ethelene Hal Clinical Pharmacist Assistant [phone]

## 2021-03-11 ENCOUNTER — Other Ambulatory Visit (HOSPITAL_COMMUNITY): Payer: Self-pay | Admitting: Internal Medicine

## 2021-03-11 DIAGNOSIS — Z1231 Encounter for screening mammogram for malignant neoplasm of breast: Secondary | ICD-10-CM

## 2021-03-18 ENCOUNTER — Other Ambulatory Visit: Payer: Medicare HMO

## 2021-03-19 ENCOUNTER — Ambulatory Visit (INDEPENDENT_AMBULATORY_CARE_PROVIDER_SITE_OTHER): Payer: Medicare HMO

## 2021-03-19 DIAGNOSIS — R0602 Shortness of breath: Secondary | ICD-10-CM | POA: Diagnosis not present

## 2021-03-19 LAB — ECHOCARDIOGRAM COMPLETE
Area-P 1/2: 3.08 cm2
S' Lateral: 3.65 cm

## 2021-03-25 ENCOUNTER — Other Ambulatory Visit: Payer: Medicare HMO

## 2021-03-26 ENCOUNTER — Ambulatory Visit (HOSPITAL_COMMUNITY)
Admission: RE | Admit: 2021-03-26 | Discharge: 2021-03-26 | Disposition: A | Discharge status: Home / Self Care | Payer: Medicare HMO | Source: Ambulatory Visit | Attending: Internal Medicine | Admitting: Internal Medicine

## 2021-03-26 ENCOUNTER — Other Ambulatory Visit: Payer: Self-pay

## 2021-03-26 DIAGNOSIS — Z1231 Encounter for screening mammogram for malignant neoplasm of breast: Secondary | ICD-10-CM | POA: Diagnosis not present

## 2021-03-27 ENCOUNTER — Telehealth: Payer: Self-pay | Admitting: Cardiology

## 2021-03-27 NOTE — Telephone Encounter (Signed)
Requesting Echo results - informed patient that message will be forwarded to provider as results are on his desktop awaiting his final disposition.   ?

## 2021-03-27 NOTE — Telephone Encounter (Signed)
Patient called to talk with Dr. Domenic Polite or nurse. Please call back ?

## 2021-03-27 NOTE — Telephone Encounter (Signed)
Patient called to talk with Dr. Harl Bowie or nurse. Please call back ?

## 2021-03-27 NOTE — Telephone Encounter (Signed)
error 

## 2021-03-28 NOTE — Telephone Encounter (Signed)
Laurine Blazer, LPN  ?1/58/3094  0:76 PM EST Back to Top  ?  ?Notified, copy to pcp.   ? Arnoldo Lenis, MD  ?03/28/2021 11:00 AM EST   ?  ?Echo overall looks good. Heart pumping function remains normal. Mild leave of mitral and aortic valves which are nothing worrisome. Overall echo findings are fine ?  ?Zandra Abts MD  ? ?

## 2021-03-28 NOTE — Telephone Encounter (Signed)
Just resulted   J Azlyn Wingler MD 

## 2021-04-02 ENCOUNTER — Telehealth: Payer: Self-pay

## 2021-04-02 NOTE — Progress Notes (Signed)
? ? ?Chronic Care Management ?Pharmacy Assistant  ? ?Name: Angela Simmons  MRN: [id number] DOB: [birth year] ? ?Angela Simmons is an 76 y.o. year old female who presents for his follow-up CCM visit with the clinical pharmacist. ? ?Reason for Encounter: Disease State ?  ?Conditions to be addressed/monitored: ?HTN ? ?Recent office visits:  ?None ID ? ?Recent consult visits:  ?None ID ? ?Hospital visits:  ?None since last coordination call ? ?Medications: ?Outpatient Encounter Medications as of 04/02/2021  ?Medication Sig  ? apixaban (ELIQUIS) 5 MG TABS tablet Take 1 tablet (5 mg total) by mouth 2 (two) times daily.  ? atorvastatin (LIPITOR) 10 MG tablet TAKE 1 TABLET BY MOUTH DAILY  ? benazepril (LOTENSIN) 40 MG tablet TAKE 1 TABLET BY MOUTH DAILY  ? Blood Glucose Monitoring Suppl (ONETOUCH VERIO) w/Device KIT USE TO TEST BLOOD SUGAR ONCE DAILY AS DIRECTED  ? diclofenac sodium (VOLTAREN) 1 % GEL Apply 3 g to 3 large joints up to 3 times daily.  ? DILT-XR 120 MG 24 hr capsule TAKE 1 CAPSULE BY MOUTH EVERY DAY  ? fluticasone (FLONASE) 50 MCG/ACT nasal spray Place 1 spray into both nostrils as needed for allergies or rhinitis.  ? gabapentin (NEURONTIN) 100 MG capsule Take 1 capsule (100 mg total) by mouth at bedtime.  ? Ginger, Zingiber officinalis, 550 MG CAPS Take 1 capsule (550 mg total) by mouth daily.  ? glucose blood test strip USE TO TEST BLOOD SUGAR ONCE DAILY. E11.40  ? hydroxychloroquine (PLAQUENIL) 200 MG tablet Take 1 tablet (200 mg total) by mouth daily.  ? loratadine (CLARITIN) 10 MG tablet TAKE 1 TABLET BY MOUTH EVERY DAY  ? metoprolol tartrate (LOPRESSOR) 25 MG tablet TAKE 1 TABLET BY MOUTH TWICE DAILY  ? Multiple Vitamins-Minerals (CENTRUM SILVER 50+WOMEN) TABS Take 1 tablet by mouth daily at 6 (six) AM.  ? nitroGLYCERIN (NITROSTAT) 0.4 MG SL tablet Place 1 tablet (0.4 mg total) under the tongue every 5 (five) minutes as needed for chest pain.  ? Omega-3 Fatty Acids (FISH OIL) 1000 MG CAPS Take 1,000 mg by  mouth daily.  ? omeprazole (PRILOSEC) 40 MG capsule TAKE ONE CAPSULE BY MOUTH DAILY  ? ONETOUCH DELICA LANCETS 91Y MISC USE TO TEST ONCE DAILY  ? ONETOUCH VERIO test strip USE TO TEST BLOOD SUGAR ONCE DAILY  ? Turmeric (QC TUMERIC COMPLEX) 500 MG CAPS Take 1 capsule by mouth daily.  ? ?No facility-administered encounter medications on file as of 04/02/2021.  ? ?Reviewed chart prior to disease state call. Spoke with patient regarding BP ? ?Recent Office Vitals: ?BP Readings from Last 3 Encounters:  ?02/27/21 (!) 162/80  ?02/27/21 (!) 142/86  ?02/24/21 140/80  ? ?Pulse Readings from Last 3 Encounters:  ?02/27/21 (!) 55  ?02/27/21 63  ?02/24/21 65  ?  ?Wt Readings from Last 3 Encounters:  ?02/27/21 188 lb (85.3 kg)  ?02/27/21 188 lb 8 oz (85.5 kg)  ?02/24/21 188 lb (85.3 kg)  ?  ? ?Kidney Function ?Lab Results  ?Component Value Date/Time  ? CREATININE 1.07 01/03/2021 11:44 AM  ? CREATININE 1.12 (H) 09/25/2020 11:11 AM  ? CREATININE 0.96 06/28/2020 12:48 PM  ? CREATININE 0.92 04/23/2020 11:44 AM  ? GFR 51.13 (L) 01/03/2021 11:44 AM  ? GFRNONAA 62 04/23/2020 11:44 AM  ? GFRAA 72 04/23/2020 11:44 AM  ? ? ?BMP Latest Ref Rng & Units 01/03/2021 09/25/2020 06/28/2020  ?Glucose 70 - 99 mg/dL 79 118(H) 80  ?BUN 6 - 23 mg/dL _0 ?  Creatinine 0.40 - 1.20 mg/dL 1.07 1.12(H) 0.96  ?BUN/Creat Ratio 6 - 22 (calc) - 12 -  ?Sodium 135 - 145 mEq/L 139 141 140  ?Potassium 3.5 - 5.1 mEq/L 4.0 4.2 4.0  ?Chloride 96 - 112 mEq/L 104 106 103  ?CO2 19 - 32 mEq/L _0 ?Calcium 8.4 - 10.5 mg/dL 9.6 9.1 9.4  ? ? ?Current antihypertensive regimen:  ?Benazepril 40 mg ?Metoprolol tartrate 25 mg ? ?How often are you checking your Blood Pressure? Patient states that she usually takes in the morning daily ? ?Current home BP readings:Patient states that this morning blood pressure was 135/75  ? ?What recent interventions/DTPs have been made by any provider to improve Blood Pressure control since last CPP Visit: None noted ? ?Any recent  hospitalizations or ED visits since last visit with CPP? No ? ?What diet changes have been made to improve Blood Pressure Control?  ?Patient states that she has cut out fats and sweets ? ?What exercise is being done to improve your Blood Pressure Control?  ?Patient states that she is moving around more but has not gotten back to a exercise routine ? ?Adherence Review: ?Is the patient currently on ACE/ARB medication? Yes ?Does the patient have >5 day gap between last estimated fill dates? No ? ?Care Gaps: ?Colonoscopy-06/22/11 ?Diabetic Foot Exam-06/21/19 ?Mammogram-03/26/21 ?Ophthalmology-01/10/20 ?Dexa Scan - 08/26/20 ?Annual Well Visit - NA ?Micro albumin-NA ?Hemoglobin A1c- 01/03/21 ?  ?Star Rating Drugs: ?Atorvastatin 10 mg-last fill 01/23/21 90 ds ?Benazepril 40 mg-last fill 01/25/21 90 ds ? ?Ethelene Hal ?Clinical Pharmacist Assistant ?[phone]  ?

## 2021-04-09 NOTE — Progress Notes (Signed)
? ?  I, [name], LAT, ATC, am serving as scribe for Dr. Lynne Leader. ? ?Angela Simmons is a 76 y.o. female who presents to [place] at [facility] today for f/u of R knee pain due to DJD and R post leg pain likely due to lumbar radiculopathy.  She was last seen by Dr. Georgina Snell on 02/27/21 and had a R knee steroid injection and was prescribed Gabapentin.  Today, pt reports that she's feeling ok.  She states that her R leg isn't doing what she wants it to.  She notes that her R foot feels "heavy" and isn't moving like she wants it to, making it difficult to drive.  She thinks she has some paresthesias in her R foot but isn't sure.  She states that the R knee injection helped her R knee pain somewhat.  She is no longer having post knee/distal thigh pain.  She has stopped taking the Gabapentin, noting that it caused night sweats. ? ?She lives in [place]. ? ?Diagnostic testing: R knee and L-spine XR- 02/27/21 ? ?Pertinent review of systems: No fevers or chills ? ?Relevant historical information: Diabetes ? ? ?Exam:  ?BP (!) 150/88 (BP Location: Left Arm, Patient Position: Sitting, Cuff Size: Normal)   Pulse 64   Ht '5\' 9"'$  (1.753 m)   Wt 191 lb 9.6 oz (86.9 kg)   SpO2 97%   BMI 28.29 kg/m?  ?General: Well Developed, well nourished, and in no acute distress.  ? ?MSK: L-spine: Nontender midline. ?Normal lumbar motion. ?Lower extremity strength appears to be intact.  Very difficult to appreciate any significant weakness to lower extremity strength testing. ?Mildly positive right-sided slump test. ?Pulses capillary fill and sensation are intact distally. ? ? ? ?Lab and Radiology Results ?EXAM: ?LUMBAR SPINE - 2-3 VIEW ?  ?COMPARISON:  11/22/2019. ?  ?FINDINGS: ?No fracture or bone lesion. ?  ?Straightened lumbar lordosis.  No spondylolisthesis. ?  ?Mild loss of disc height L2-L3. Moderate loss of disc height at ?L4-L5 and L5-S1. Endplate osteophytes noted most evident at L5-S1. ?  ?Soft tissues are  unremarkable. ?  ?IMPRESSION: ?1. No fracture or acute finding. ?2. Disc degenerative changes as detailed similar to the prior exam. ?  ?  ?Electronically Signed ?  By: Lajean Manes M.D. ?  On: 02/28/2021 17:34 ?I, Lynne Leader, personally (independently) visualized and performed the interpretation of the images attached in this note. ? ? ? ?Assessment and Plan: ?76 y.o. female with right lumbar radiculopathy.  She is reporting some subtle weakness to the right foot plantarflexion.  This is also consistent with the S1 radiculopathy that I thought she had last visit.  At this point she is failing some conservative management.  Would recommend MRI as next step if her symptoms are bad enough to warrant it.  She does not think it is bad enough at this time. ?Plan for watchful waiting and if worsening or not improving proceed to MRI for potential injection planning. ? ? ? ?Discussed warning signs or symptoms. Please see discharge instructions. Patient expresses understanding. ? ? ?The above documentation has been reviewed and is accurate and complete Lynne Leader, M.D. ? ? ? ?

## 2021-04-10 ENCOUNTER — Ambulatory Visit: Payer: Medicare HMO | Admitting: Family Medicine

## 2021-04-10 ENCOUNTER — Other Ambulatory Visit: Payer: Self-pay

## 2021-04-10 ENCOUNTER — Encounter: Payer: Self-pay | Admitting: Family Medicine

## 2021-04-10 VITALS — BP 150/88 | HR 64 | Ht 69.0 in | Wt 191.6 lb

## 2021-04-10 DIAGNOSIS — M5416 Radiculopathy, lumbar region: Secondary | ICD-10-CM | POA: Diagnosis not present

## 2021-04-10 NOTE — Patient Instructions (Addendum)
Good to see you today. ? ?Let me know if your symptoms worsen and I can order an MRI at a later time. ? ?Follow-up: as needed ?

## 2021-04-16 ENCOUNTER — Encounter: Payer: Self-pay | Admitting: Internal Medicine

## 2021-04-16 DIAGNOSIS — J020 Streptococcal pharyngitis: Secondary | ICD-10-CM

## 2021-04-16 MED ORDER — BENZONATATE 100 MG PO CAPS: 100.0000 mg | ORAL_CAPSULE | Freq: Two times a day (BID) | ORAL | 0 refills | Status: DC | PRN

## 2021-05-06 ENCOUNTER — Encounter: Payer: Self-pay | Admitting: *Deleted

## 2021-05-08 ENCOUNTER — Telehealth: Payer: Self-pay

## 2021-05-08 DIAGNOSIS — E1142 Type 2 diabetes mellitus with diabetic polyneuropathy: Secondary | ICD-10-CM | POA: Diagnosis not present

## 2021-05-08 DIAGNOSIS — B351 Tinea unguium: Secondary | ICD-10-CM | POA: Diagnosis not present

## 2021-05-08 DIAGNOSIS — M79676 Pain in unspecified toe(s): Secondary | ICD-10-CM | POA: Diagnosis not present

## 2021-05-08 DIAGNOSIS — L84 Corns and callosities: Secondary | ICD-10-CM | POA: Diagnosis not present

## 2021-05-08 NOTE — Progress Notes (Signed)
? ? ?Chronic Care Management ?Pharmacy Assistant  ? ?Name: Angela Simmons  MRN: [id number] DOB: 25-Jun-1945 ? ? ?Reason for Encounter: Disease State ?  ?Conditions to be addressed/monitored: ?HTN ? ?Recent office visits:  ?None since the last coordination call ? ?Recent consult visits:  ?04/12/21 Gregor Hams, MD-Sports Medicine (Lumbar radiculopathy) No orders or med changes ? ?Hospital visits:  ?None in previous 6 months ? ?Medications: ?Outpatient Encounter Medications as of 05/08/2021  ?Medication Sig  ? apixaban (ELIQUIS) 5 MG TABS tablet Take 1 tablet (5 mg total) by mouth 2 (two) times daily.  ? atorvastatin (LIPITOR) 10 MG tablet TAKE 1 TABLET BY MOUTH DAILY  ? benazepril (LOTENSIN) 40 MG tablet TAKE 1 TABLET BY MOUTH DAILY  ? benzonatate (TESSALON) 100 MG capsule Take 1 capsule (100 mg total) by mouth 2 (two) times daily as needed for cough.  ? Blood Glucose Monitoring Suppl (ONETOUCH VERIO) w/Device KIT USE TO TEST BLOOD SUGAR ONCE DAILY AS DIRECTED  ? diclofenac sodium (VOLTAREN) 1 % GEL Apply 3 g to 3 large joints up to 3 times daily.  ? DILT-XR 120 MG 24 hr capsule TAKE 1 CAPSULE BY MOUTH EVERY DAY  ? fluticasone (FLONASE) 50 MCG/ACT nasal spray Place 1 spray into both nostrils as needed for allergies or rhinitis.  ? gabapentin (NEURONTIN) 100 MG capsule Take 1 capsule (100 mg total) by mouth at bedtime. (Patient not taking: Reported on 04/10/2021)  ? Ginger, Zingiber officinalis, 550 MG CAPS Take 1 capsule (550 mg total) by mouth daily.  ? glucose blood test strip USE TO TEST BLOOD SUGAR ONCE DAILY. E11.40  ? hydroxychloroquine (PLAQUENIL) 200 MG tablet Take 1 tablet (200 mg total) by mouth daily.  ? loratadine (CLARITIN) 10 MG tablet TAKE 1 TABLET BY MOUTH EVERY DAY  ? metoprolol tartrate (LOPRESSOR) 25 MG tablet TAKE 1 TABLET BY MOUTH TWICE DAILY  ? Multiple Vitamins-Minerals (CENTRUM SILVER 50+WOMEN) TABS Take 1 tablet by mouth daily at 6 (six) AM.  ? nitroGLYCERIN (NITROSTAT) 0.4 MG SL tablet Place 1  tablet (0.4 mg total) under the tongue every 5 (five) minutes as needed for chest pain.  ? Omega-3 Fatty Acids (FISH OIL) 1000 MG CAPS Take 1,000 mg by mouth daily.  ? omeprazole (PRILOSEC) 40 MG capsule TAKE ONE CAPSULE BY MOUTH DAILY  ? ONETOUCH DELICA LANCETS 29N MISC USE TO TEST ONCE DAILY  ? ONETOUCH VERIO test strip USE TO TEST BLOOD SUGAR ONCE DAILY  ? Turmeric (QC TUMERIC COMPLEX) 500 MG CAPS Take 1 capsule by mouth daily.  ? ?No facility-administered encounter medications on file as of 05/08/2021.  ? ? ?Recent Office Vitals: ?BP Readings from Last 3 Encounters:  ?04/10/21 (!) 150/88  ?02/27/21 (!) 162/80  ?02/27/21 (!) 142/86  ? ?Pulse Readings from Last 3 Encounters:  ?04/10/21 64  ?02/27/21 (!) 55  ?02/27/21 63  ?  ?Wt Readings from Last 3 Encounters:  ?04/10/21 191 lb 9.6 oz (86.9 kg)  ?02/27/21 188 lb (85.3 kg)  ?02/27/21 188 lb 8 oz (85.5 kg)  ?  ? ?Kidney Function ?Lab Results  ?Component Value Date/Time  ? CREATININE 1.07 01/03/2021 11:44 AM  ? CREATININE 1.12 (H) 09/25/2020 11:11 AM  ? CREATININE 0.96 06/28/2020 12:48 PM  ? CREATININE 0.92 04/23/2020 11:44 AM  ? GFR 51.13 (L) 01/03/2021 11:44 AM  ? GFRNONAA 62 04/23/2020 11:44 AM  ? GFRAA 72 04/23/2020 11:44 AM  ? ? ? ?  Latest Ref Rng & Units 01/03/2021  ? 11:44 AM  09/25/2020  ? 11:11 AM 06/28/2020  ? 12:48 PM  ?BMP  ?Glucose 70 - 99 mg/dL 79   118   80    ?BUN 6 - 23 mg/dL 14   13   12    ?Creatinine 0.40 - 1.20 mg/dL 1.07   1.12   0.96    ?BUN/Creat Ratio 6 - 22 (calc)  12     ?Sodium 135 - 145 mEq/L 139   141   140    ?Potassium 3.5 - 5.1 mEq/L 4.0   4.2   4.0    ?Chloride 96 - 112 mEq/L 104   106   103    ?CO2 19 - 32 mEq/L 30   28   28    ?Calcium 8.4 - 10.5 mg/dL 9.6   9.1   9.4    ? ? ?Reviewed chart prior to disease state call. Spoke with patient regarding BP ? ?Current antihypertensive regimen:  ?Benazepril 40 mg ?Metoprolol tartrate 25 mg ? ?How often are you checking your Blood Pressure? Patient states that she checks blood pressure daily,  when she remembers. Patient states that she would like to have a booklet that logs her blood pressure to be mailed to her. She states that she received one from Dr. Burns office  ? ?Current home BP readings: Patient reports this morning blood pressure was 128/71 and pulse 55 ? ?What recent interventions/DTPs have been made by any provider to improve Blood Pressure control since last CPP Visit: None since last coordination call ? ?Any recent hospitalizations or ED visits since last visit with CPP? No, none since last coordination call ? ?What diet changes have been made to improve Blood Pressure Control?  ?Patient states that she has cut back on sugar and starch ? ?What exercise is being done to improve your Blood Pressure Control?  ?Patient states that she is doing more walking since it is warmer ? ?Adherence Review: ?Is the patient currently on ACE/ARB medication? Yes ?Does the patient have >5 day gap between last estimated fill dates? No ? ?Care Gaps: ?Colonoscopy-06/22/11 ?Diabetic Foot Exam-06/21/19 ?Mammogram-03/20/20 ?Ophthalmology-01/10/20 ?Dexa Scan - 08/26/20 ?Annual Well Visit - NA ?Micro albumin-NA ?Hemoglobin A1c- 01/03/21 ?  ?Star Rating Drugs: ?Atorvastatin 10 mg-last fill 01/23/21 90 ds ?Benazepril 40 mg-last fill 01/25/21 90 ds ?  ?  ?Tracy Ellis,CMA ?Clinical Pharmacist Assistant ?[phone]  ?  ?

## 2021-05-12 ENCOUNTER — Encounter: Payer: Self-pay | Admitting: Internal Medicine

## 2021-05-12 NOTE — Progress Notes (Signed)
? ? ?Subjective:  ? ? Patient ID: Angela Simmons, female    DOB: 03-30-1945, 76 y.o.   MRN: [id number] ? ?This visit occurred during the SARS-CoV-2 public health emergency.  Safety protocols were in place, including screening questions prior to the visit, additional usage of staff PPE, and extensive cleaning of exam room while observing appropriate contact time as indicated for disinfecting solutions. ? ? ? ?HPI ?Angela Simmons is here for  ?Chief Complaint  ?Patient presents with  ? Cough  ? ? ?She is here for an acute visit for cold symptoms.  ? ?Her symptoms started last Thursday - 5 days ago.   ? ?She is experiencing sweats, fatigue, decreased appetite, congestion, ear pain, postnasal drip, rhinorrhea, intermittent sore throat, cough, shortness of breath, wheezing, some nonspecific back pain, headaches and dizziness.  She denies any sinus pain or pressure or documented fevers. ? ?She has tried taking tessalon perles, coricidin, tylenol, flonase ? ? ? ? ? ?Medications and allergies reviewed with patient and updated if appropriate. ? ?Current Outpatient Medications on File Prior to Visit  ?Medication Sig Dispense Refill  ? apixaban (ELIQUIS) 5 MG TABS tablet Take 1 tablet (5 mg total) by mouth 2 (two) times daily. 42 tablet 0  ? atorvastatin (LIPITOR) 10 MG tablet TAKE 1 TABLET BY MOUTH DAILY 90 tablet 1  ? benazepril (LOTENSIN) 40 MG tablet TAKE 1 TABLET BY MOUTH DAILY 90 tablet 0  ? benzonatate (TESSALON) 100 MG capsule Take 1 capsule (100 mg total) by mouth 2 (two) times daily as needed for cough. 30 capsule 0  ? Blood Glucose Monitoring Suppl (ONETOUCH VERIO) w/Device KIT USE TO TEST BLOOD SUGAR ONCE DAILY AS DIRECTED 1 kit 0  ? diclofenac sodium (VOLTAREN) 1 % GEL Apply 3 g to 3 large joints up to 3 times daily. 3 Tube 3  ? DILT-XR 120 MG 24 hr capsule TAKE 1 CAPSULE BY MOUTH EVERY DAY 90 capsule 3  ? fluticasone (FLONASE) 50 MCG/ACT nasal spray Place 1 spray into both nostrils as needed for allergies or rhinitis.    ?  Ginger, Zingiber officinalis, 550 MG CAPS Take 1 capsule (550 mg total) by mouth daily. 60 capsule 0  ? glucose blood test strip USE TO TEST BLOOD SUGAR ONCE DAILY. E11.40 100 each 3  ? hydroxychloroquine (PLAQUENIL) 200 MG tablet Take 1 tablet (200 mg total) by mouth daily. 90 tablet 0  ? loratadine (CLARITIN) 10 MG tablet TAKE 1 TABLET BY MOUTH EVERY DAY 30 tablet 2  ? metoprolol tartrate (LOPRESSOR) 25 MG tablet TAKE 1 TABLET BY MOUTH TWICE DAILY 60 tablet 6  ? Multiple Vitamins-Minerals (CENTRUM SILVER 50+WOMEN) TABS Take 1 tablet by mouth daily at 6 (six) AM.    ? nitroGLYCERIN (NITROSTAT) 0.4 MG SL tablet Place 1 tablet (0.4 mg total) under the tongue every 5 (five) minutes as needed for chest pain. 25 tablet 3  ? Omega-3 Fatty Acids (FISH OIL) 1000 MG CAPS Take 1,000 mg by mouth daily.    ? omeprazole (PRILOSEC) 40 MG capsule TAKE ONE CAPSULE BY MOUTH DAILY 90 capsule 3  ? ONETOUCH DELICA LANCETS 63J MISC USE TO TEST ONCE DAILY 100 each 2  ? ONETOUCH VERIO test strip USE TO TEST BLOOD SUGAR ONCE DAILY 100 each 0  ? Turmeric (QC TUMERIC COMPLEX) 500 MG CAPS Take 1 capsule by mouth daily.    ? gabapentin (NEURONTIN) 100 MG capsule Take 1 capsule (100 mg total) by mouth at bedtime. (Patient not taking: Reported  on 05/13/2021) 30 capsule 3  ? ?No current facility-administered medications on file prior to visit.  ? ? ?Review of Systems  ?Constitutional:  Positive for appetite change, diaphoresis and fatigue. Negative for fever.  ?HENT:  Positive for congestion, ear pain, postnasal drip, rhinorrhea and sore throat (intermittent). Negative for sinus pressure and sinus pain.   ?Respiratory:  Positive for cough, shortness of breath and wheezing. Negative for chest tightness.   ?Cardiovascular:  Negative for chest pain.  ?Gastrointestinal:  Positive for nausea. Negative for diarrhea.  ?Musculoskeletal:  Positive for back pain.  ?Neurological:  Positive for dizziness and headaches. Negative for light-headedness.  ? ?    ?Objective:  ? ?Vitals:  ? 05/13/21 0901  ?BP: 140/80  ?Pulse: 80  ?Temp: 98.9 ?F (37.2 ?C)  ?SpO2: 95%  ? ?BP Readings from Last 3 Encounters:  ?05/13/21 140/80  ?04/10/21 (!) 150/88  ?02/27/21 (!) 162/80  ? ?Wt Readings from Last 3 Encounters:  ?05/13/21 186 lb 9.6 oz (84.6 kg)  ?04/10/21 191 lb 9.6 oz (86.9 kg)  ?02/27/21 188 lb (85.3 kg)  ? ?Body mass index is 27.56 kg/m?. ? ?  ?Physical Exam ?Constitutional:   ?   General: She is not in acute distress. ?   Appearance: Normal appearance. She is not ill-appearing.  ?HENT:  ?   Head: Normocephalic and atraumatic.  ?   Right Ear: Tympanic membrane, ear canal and external ear normal.  ?   Left Ear: Tympanic membrane, ear canal and external ear normal.  ?   Mouth/Throat:  ?   Mouth: Mucous membranes are moist.  ?   Pharynx: No oropharyngeal exudate or posterior oropharyngeal erythema.  ?Eyes:  ?   Conjunctiva/sclera: Conjunctivae normal.  ?Cardiovascular:  ?   Rate and Rhythm: Normal rate and regular rhythm.  ?Pulmonary:  ?   Effort: Pulmonary effort is normal. No respiratory distress.  ?   Breath sounds: Normal breath sounds. No wheezing or rales.  ?Musculoskeletal:  ?   Cervical back: Neck supple. No tenderness.  ?Lymphadenopathy:  ?   Cervical: No cervical adenopathy.  ?Skin: ?   General: Skin is warm and dry.  ?Neurological:  ?   Mental Status: She is alert.  ? ?   ? ? ? ? ? ?Assessment & Plan:  ? ? ?See Problem List for Assessment and Plan of chronic medical problems.  ? ? ? ? ?

## 2021-05-13 ENCOUNTER — Encounter: Payer: Self-pay | Admitting: Internal Medicine

## 2021-05-13 ENCOUNTER — Ambulatory Visit (INDEPENDENT_AMBULATORY_CARE_PROVIDER_SITE_OTHER): Payer: Medicare Other | Admitting: Internal Medicine

## 2021-05-13 ENCOUNTER — Other Ambulatory Visit: Payer: Self-pay | Admitting: Internal Medicine

## 2021-05-13 DIAGNOSIS — J020 Streptococcal pharyngitis: Secondary | ICD-10-CM

## 2021-05-13 DIAGNOSIS — J069 Acute upper respiratory infection, unspecified: Secondary | ICD-10-CM | POA: Insufficient documentation

## 2021-05-13 DIAGNOSIS — I10 Essential (primary) hypertension: Secondary | ICD-10-CM

## 2021-05-13 MED ORDER — HYDROCODONE BIT-HOMATROP MBR 5-1.5 MG/5ML PO SOLN: 5.0000 mL | Freq: Three times a day (TID) | ORAL | 0 refills | Status: DC | PRN

## 2021-05-13 MED ORDER — BENZONATATE 100 MG PO CAPS: 100.0000 mg | ORAL_CAPSULE | Freq: Two times a day (BID) | ORAL | 0 refills | Status: DC | PRN

## 2021-05-13 NOTE — Patient Instructions (Addendum)

## 2021-05-13 NOTE — Assessment & Plan Note (Signed)
Acute ?Symptoms likely viral in nature ?Discussed the symptoms can last 10-14 days ?Calcium antibiotic will not in the situation ?Tessalon Perles 100 mg 2-3 times a day as needed, Hycodan cough syrup 5 mils every 8 hours as needed ?Continue symptomatic treatment with over-the-counter cold medications, Tylenol/ibuprofen ?Increase rest and fluids ?Call if symptoms worsen or do not improve ? ?

## 2021-05-13 NOTE — Assessment & Plan Note (Signed)
Chronic ?Blood pressure acceptable especially since she is here for sick visit ?Continue benazepril 40 mg daily, metoprolol 25 mg twice daily ?

## 2021-05-17 ENCOUNTER — Encounter (HOSPITAL_BASED_OUTPATIENT_CLINIC_OR_DEPARTMENT_OTHER): Payer: Self-pay | Admitting: Emergency Medicine

## 2021-05-17 ENCOUNTER — Emergency Department (HOSPITAL_BASED_OUTPATIENT_CLINIC_OR_DEPARTMENT_OTHER)
Admission: EM | Admit: 2021-05-17 | Discharge: 2021-05-18 | Disposition: A | Discharge status: Home / Self Care | Payer: Medicare Other | Attending: Emergency Medicine | Admitting: Emergency Medicine

## 2021-05-17 ENCOUNTER — Other Ambulatory Visit: Payer: Self-pay

## 2021-05-17 DIAGNOSIS — Z7984 Long term (current) use of oral hypoglycemic drugs: Secondary | ICD-10-CM | POA: Insufficient documentation

## 2021-05-17 DIAGNOSIS — Z853 Personal history of malignant neoplasm of breast: Secondary | ICD-10-CM | POA: Insufficient documentation

## 2021-05-17 DIAGNOSIS — Z79899 Other long term (current) drug therapy: Secondary | ICD-10-CM | POA: Insufficient documentation

## 2021-05-17 DIAGNOSIS — Z7901 Long term (current) use of anticoagulants: Secondary | ICD-10-CM | POA: Diagnosis not present

## 2021-05-17 DIAGNOSIS — N3000 Acute cystitis without hematuria: Secondary | ICD-10-CM | POA: Diagnosis not present

## 2021-05-17 DIAGNOSIS — I10 Essential (primary) hypertension: Secondary | ICD-10-CM | POA: Insufficient documentation

## 2021-05-17 DIAGNOSIS — R112 Nausea with vomiting, unspecified: Secondary | ICD-10-CM | POA: Diagnosis not present

## 2021-05-17 DIAGNOSIS — E1165 Type 2 diabetes mellitus with hyperglycemia: Secondary | ICD-10-CM | POA: Insufficient documentation

## 2021-05-17 DIAGNOSIS — R109 Unspecified abdominal pain: Secondary | ICD-10-CM | POA: Diagnosis present

## 2021-05-17 LAB — CBC
HCT: 37.6 % (ref 36.0–46.0)
Hemoglobin: 12 g/dL (ref 12.0–15.0)
MCH: 29.6 pg (ref 26.0–34.0)
MCHC: 31.9 g/dL (ref 30.0–36.0)
MCV: 92.8 fL (ref 80.0–100.0)
Platelets: 206 10*3/uL (ref 150–400)
RBC: 4.05 MIL/uL (ref 3.87–5.11)
RDW: 13.2 % (ref 11.5–15.5)
WBC: 3.7 10*3/uL — ABNORMAL LOW (ref 4.0–10.5)
nRBC: 0 % (ref 0.0–0.2)

## 2021-05-17 LAB — URINALYSIS, ROUTINE W REFLEX MICROSCOPIC
Bilirubin Urine: NEGATIVE
Glucose, UA: NEGATIVE mg/dL
Hgb urine dipstick: NEGATIVE
Ketones, ur: NEGATIVE mg/dL
Nitrite: NEGATIVE
Protein, ur: NEGATIVE mg/dL
Specific Gravity, Urine: 1.015 (ref 1.005–1.030)
pH: 7.5 (ref 5.0–8.0)

## 2021-05-17 LAB — COMPREHENSIVE METABOLIC PANEL
ALT: 14 U/L (ref 0–44)
AST: 16 U/L (ref 15–41)
Albumin: 4 g/dL (ref 3.5–5.0)
Alkaline Phosphatase: 62 U/L (ref 38–126)
Anion gap: 10 (ref 5–15)
BUN: 10 mg/dL (ref 8–23)
CO2: 25 mmol/L (ref 22–32)
Calcium: 9.9 mg/dL (ref 8.9–10.3)
Chloride: 99 mmol/L (ref 98–111)
Creatinine, Ser: 0.78 mg/dL (ref 0.44–1.00)
GFR, Estimated: >60 mL/min (ref >60)
Glucose, Bld: 117 mg/dL — ABNORMAL HIGH (ref 70–99)
Potassium: 3.7 mmol/L (ref 3.5–5.1)
Sodium: 134 mmol/L — ABNORMAL LOW (ref 135–145)
Total Bilirubin: 0.5 mg/dL (ref 0.3–1.2)
Total Protein: 7.7 g/dL (ref 6.5–8.1)

## 2021-05-17 LAB — LIPASE, BLOOD: Lipase: 22 U/L (ref 11–51)

## 2021-05-17 MED ORDER — ONDANSETRON 4 MG PO TBDP
4.0000 mg | ORAL_TABLET | Freq: Once | ORAL | Status: AC | PRN
  Administered 2021-05-17: 4 mg via ORAL
  Filled 2021-05-17: qty 1

## 2021-05-17 NOTE — ED Triage Notes (Signed)
Pt dx with upper respiratory infection on Monday. Nausea vomiting and abd pain started today.  ?

## 2021-05-18 ENCOUNTER — Emergency Department (HOSPITAL_BASED_OUTPATIENT_CLINIC_OR_DEPARTMENT_OTHER): Payer: Medicare Other

## 2021-05-18 DIAGNOSIS — R059 Cough, unspecified: Secondary | ICD-10-CM | POA: Diagnosis not present

## 2021-05-18 DIAGNOSIS — R112 Nausea with vomiting, unspecified: Secondary | ICD-10-CM | POA: Diagnosis not present

## 2021-05-18 DIAGNOSIS — N3000 Acute cystitis without hematuria: Secondary | ICD-10-CM | POA: Diagnosis not present

## 2021-05-18 DIAGNOSIS — R079 Chest pain, unspecified: Secondary | ICD-10-CM | POA: Diagnosis not present

## 2021-05-18 DIAGNOSIS — R109 Unspecified abdominal pain: Secondary | ICD-10-CM | POA: Diagnosis not present

## 2021-05-18 MED ORDER — SODIUM CHLORIDE 0.9 % IV BOLUS
1000.0000 mL | Freq: Once | INTRAVENOUS | Status: AC
  Administered 2021-05-18: 1000 mL via INTRAVENOUS

## 2021-05-18 MED ORDER — FOSFOMYCIN TROMETHAMINE 3 G PO PACK
3.0000 g | PACK | Freq: Once | ORAL | Status: AC
  Administered 2021-05-18: 3 g via ORAL
  Filled 2021-05-18: qty 3

## 2021-05-18 MED ORDER — IOHEXOL 300 MG/ML  SOLN
100.0000 mL | Freq: Once | INTRAMUSCULAR | Status: AC | PRN
  Administered 2021-05-18: 100 mL via INTRAVENOUS

## 2021-05-18 NOTE — ED Provider Notes (Signed)
?[facility]PT ?Provider Note ? ? ?CSN: [id number] ?Arrival date & time: 05/17/21  2040 ? ?  ? ?History ? ?Chief Complaint  ?Patient presents with  ? Emesis  ? Nausea  ? ? ?Angela Simmons is a 76 y.o. female. ? ?The history is provided by the patient and a relative.  ?Emesis ?Severity:  Moderate ?Progression:  Worsening ?Chronicity:  New ?Relieved by:  Nothing ?Associated symptoms: abdominal pain, cough and URI   ?Associated symptoms: no fever   ?Patient with significant history of hypertension, hyperlipidemia, collagen vascular disease, atrial flutter on anticoagulation presents with nausea vomiting.  She reports recent diagnosis of upper respiratory infection and was placed on Tessalon Perles.  She reports over the past day she has started taking Hycodan since that time she had 4 episodes of nausea and vomiting with nonbloody emesis.  She reports abdominal discomfort.  No significant chest pain but does feel short of breath with cough.  She reports normal stool output without any blood. ?  ?Past Medical History:  ?Diagnosis Date  ? Breast cancer ([place]) 1990s  ? right breast  ? Collagen vascular disease ([place])   ? Diabetes mellitus without complication ([place])   ? GERD (gastroesophageal reflux disease)   ? HTN (hypertension)   ? Hypercholesterolemia   ? Obesity   ? Osteoarthritis   ? ? ?Home Medications ?Prior to Admission medications   ?Medication Sig Start Date End Date Taking? Authorizing Provider  ?apixaban (ELIQUIS) 5 MG TABS tablet Take 1 tablet (5 mg total) by mouth 2 (two) times daily. 02/24/21   Arnoldo Lenis, MD  ?atorvastatin (LIPITOR) 10 MG tablet TAKE 1 TABLET BY MOUTH DAILY 01/23/21   Binnie Rail, MD  ?benazepril (LOTENSIN) 40 MG tablet Take 1 tablet by mouth daily 05/13/21   Binnie Rail, MD  ?benzonatate (TESSALON) 100 MG capsule Take 1 capsule (100 mg total) by mouth 2 (two) times daily as needed for cough. 05/13/21   Binnie Rail, MD  ?Blood Glucose Monitoring Suppl  (ONETOUCH VERIO) w/Device KIT USE TO TEST BLOOD SUGAR ONCE DAILY AS DIRECTED 04/05/17   Binnie Rail, MD  ?diclofenac sodium (VOLTAREN) 1 % GEL Apply 3 g to 3 large joints up to 3 times daily. 07/20/17   Ofilia Neas, PA-C  ?DILT-XR 120 MG 24 hr capsule TAKE 1 CAPSULE BY MOUTH EVERY DAY 01/24/21   Dunn, Nedra Hai, PA-C  ?fluticasone (FLONASE) 50 MCG/ACT nasal spray Place 1 spray into both nostrils as needed for allergies or rhinitis.    [provider]  ?gabapentin (NEURONTIN) 100 MG capsule Take 1 capsule (100 mg total) by mouth at bedtime. ?Patient not taking: Reported on 05/13/2021 02/27/21   Gregor Hams, MD  ?Ginger, Zingiber officinalis, 550 MG CAPS Take 1 capsule (550 mg total) by mouth daily. 09/07/19   Binnie Rail, MD  ?glucose blood test strip USE TO TEST BLOOD SUGAR ONCE DAILY. E11.40 07/06/18   Binnie Rail, MD  ?HYDROcodone bit-homatropine (HYCODAN) 5-1.5 MG/5ML syrup Take 5 mLs by mouth every 8 (eight) hours as needed for cough. 05/13/21   Binnie Rail, MD  ?hydroxychloroquine (PLAQUENIL) 200 MG tablet Take 1 tablet (200 mg total) by mouth daily. 01/17/21   Ofilia Neas, PA-C  ?loratadine (CLARITIN) 10 MG tablet TAKE 1 TABLET BY MOUTH EVERY DAY 03/25/15   Fayrene Helper, MD  ?metoprolol tartrate (LOPRESSOR) 25 MG tablet TAKE 1 TABLET BY MOUTH TWICE DAILY 01/22/21   Branch,  Alphonse Guild, MD  ?Multiple Vitamins-Minerals (CENTRUM SILVER 50+WOMEN) TABS Take 1 tablet by mouth daily at 6 (six) AM. 09/07/19   Burns, Claudina Lick, MD  ?nitroGLYCERIN (NITROSTAT) 0.4 MG SL tablet Place 1 tablet (0.4 mg total) under the tongue every 5 (five) minutes as needed for chest pain. 12/05/19 02/24/22  Erlene Quan, PA-C  ?Omega-3 Fatty Acids (FISH OIL) 1000 MG CAPS Take 1,000 mg by mouth daily.    [provider]  ?omeprazole (PRILOSEC) 40 MG capsule TAKE ONE CAPSULE BY MOUTH DAILY 07/15/20   Binnie Rail, MD  ?[facility] DELICA LANCETS 75T MISC USE TO TEST ONCE DAILY 03/03/18   Binnie Rail, MD  ?[facility]  VERIO test strip USE TO TEST BLOOD SUGAR ONCE DAILY 07/06/18   Binnie Rail, MD  ?Turmeric (QC TUMERIC COMPLEX) 500 MG CAPS Take 1 capsule by mouth daily. 09/07/19   Binnie Rail, MD  ?   ? ?Allergies    ?Levofloxacin, Nitrofurantoin, Penicillins, Sulfonamide derivatives, and Pantoprazole   ? ?Review of Systems   ?Review of Systems  ?Constitutional:  Negative for fever.  ?Respiratory:  Positive for cough.   ?Gastrointestinal:  Positive for abdominal pain and vomiting. Negative for blood in stool.  ?Genitourinary:  Positive for frequency. Negative for dysuria.  ? ?Physical Exam ?Updated Vital Signs ?BP (!) 148/66   Pulse (!) 52   Temp 97.9 ?F (36.6 ?C)   Resp 18   Ht 1.753 m (5' 9" )   Wt 84.6 kg   SpO2 98%   BMI 27.54 kg/m?  ?Physical Exam ?CONSTITUTIONAL: Elderly, no acute distress ?HEAD: Normocephalic/atraumatic ?EYES: EOMI/PERRL ?ENMT: Mucous membranes moist ?NECK: supple no meningeal signs ?SPINE/BACK:entire spine nontender ?CV: S1/S2 noted, no loud murmurs ?LUNGS: Lungs are clear to auscultation bilaterally, no apparent distress ?ABDOMEN: soft, nontender, no rebound or guarding, bowel sounds noted throughout abdomen ?GU:no cva tenderness ?NEURO: Pt is awake/alert/appropriate, moves all extremitiesx4.  No facial droop.  No arm or leg drift ?EXTREMITIES: pulses normal/equal, full ROM ?SKIN: warm, color normal ?PSYCH: no abnormalities of mood noted, alert and oriented to situation ? ?ED Results / Procedures / Treatments   ?Labs ?(all labs ordered are listed, but only abnormal results are displayed) ?Labs Reviewed  ?COMPREHENSIVE METABOLIC PANEL - Abnormal; Notable for the following components:  ?    Result Value  ? Sodium 134 (*)   ? Glucose, Bld 117 (*)   ? All other components within normal limits  ?CBC - Abnormal; Notable for the following components:  ? WBC 3.7 (*)   ? All other components within normal limits  ?URINALYSIS, ROUTINE W REFLEX MICROSCOPIC - Abnormal; Notable for the following components:   ? Leukocytes,Ua MODERATE (*)   ? All other components within normal limits  ?URINE CULTURE  ?LIPASE, BLOOD  ? ? ?EKG ?EKG Interpretation ? ?Date/Time:  Sunday May 18 2021 00:27:41 EDT ?Ventricular Rate:  63 ?PR Interval:  170 ?QRS Duration: 88 ?QT Interval:  441 ?QTC Calculation: 452 ?R Axis:   21 ?Text Interpretation: Sinus rhythm Prominent P waves, nondiagnostic No significant change since last tracing Confirmed by Ripley Fraise [phone]) on 05/18/2021 12:30:58 AM ? ?Radiology ?CT ABDOMEN PELVIS W CONTRAST ? ?Result Date: 05/18/2021 ?CLINICAL DATA:  Abdominal pain, nausea/vomiting EXAM: CT ABDOMEN AND PELVIS WITH CONTRAST TECHNIQUE: Multidetector CT imaging of the abdomen and pelvis was performed using the standard protocol following bolus administration of intravenous contrast. RADIATION DOSE REDUCTION: This exam was performed according to the departmental dose-optimization program which includes  automated exposure control, adjustment of the mA and/or kV according to patient size and/or use of iterative reconstruction technique. CONTRAST:  113m OMNIPAQUE IOHEXOL 300 MG/ML  SOLN COMPARISON:  None. FINDINGS: Lower chest: Lower lobe atelectasis. Hepatobiliary: Liver is within normal limits. Gallbladder is unremarkable. No intrahepatic or extrahepatic ductal dilatation. Pancreas: Within normal limits. Spleen: Within normal limits. Adrenals/Urinary Tract: Adrenal glands are within normal limits. Bilateral renal cysts, including a 2.5 cm right lower pole renal cyst (series 2/image 35) and a 2.8 cm left lower pole renal sinus cyst (series 2/image 31). No hydronephrosis. Bladder is within normal limits. Stomach/Bowel: Stomach is notable for a small hiatal hernia. No evidence of bowel obstruction. Normal appendix (coronal image 51). No colonic wall thickening or inflammatory changes. Vascular/Lymphatic: No evidence of abdominal aortic aneurysm. Mild atherosclerotic calcifications abdominal aorta. No suspicious  abdominopelvic lymphadenopathy. Reproductive: Status post hysterectomy. No adnexal masses. Other: No abdominopelvic ascites. Musculoskeletal: Mild degenerative changes of the lumbar spine, most prominent at L5-S1. IMPRES

## 2021-05-18 NOTE — ED Notes (Signed)
Patient transported to CT 

## 2021-05-19 DIAGNOSIS — C50911 Malignant neoplasm of unspecified site of right female breast: Secondary | ICD-10-CM | POA: Diagnosis not present

## 2021-05-19 LAB — URINE CULTURE: Culture: NO GROWTH

## 2021-05-20 ENCOUNTER — Other Ambulatory Visit: Payer: Self-pay

## 2021-05-20 ENCOUNTER — Encounter: Payer: Self-pay | Admitting: Internal Medicine

## 2021-05-20 MED ORDER — GLUCOSE BLOOD VI STRP: ORAL_STRIP | 3 refills | Status: DC

## 2021-05-27 ENCOUNTER — Ambulatory Visit (INDEPENDENT_AMBULATORY_CARE_PROVIDER_SITE_OTHER): Payer: Medicare Other

## 2021-05-27 VITALS — BP 122/83

## 2021-05-27 DIAGNOSIS — I10 Essential (primary) hypertension: Secondary | ICD-10-CM

## 2021-05-27 DIAGNOSIS — E1140 Type 2 diabetes mellitus with diabetic neuropathy, unspecified: Secondary | ICD-10-CM

## 2021-05-27 DIAGNOSIS — I480 Paroxysmal atrial fibrillation: Secondary | ICD-10-CM

## 2021-05-27 NOTE — Progress Notes (Signed)
Chronic Care Management Pharmacy Note  05/27/2021 Name:  Angela Simmons MRN:  [id number] DOB:  Jul 12, 1945  Summary: -Patient endorses compliance with current medications, denies any issues or concerns with medications at this time  -Checking BP at home, has ranged from 110-130/80-90  Recommendations/Changes made from today's visit: -Recommending no changes to medications, reviewed with patient BP goals / monitoring for signs/symptoms of bleeding from elquis patient to reach out should BP/HR control be lost prior to next appointment  Plan: -F/u in 6 months   Subjective: Angela Simmons is an 76 y.o. year old female who is a primary patient of Burns, Bobette Mo, MD.  The CCM team was consulted for assistance with disease management and care coordination needs.    Engaged with patient by telephone for follow up visit in response to provider referral for pharmacy case management and/or care coordination services.   Consent to Services:  The patient was given the following information about Chronic Care Management services today, agreed to services, and gave verbal consent: 1. CCM service includes personalized support from designated clinical staff supervised by the primary care provider, including individualized plan of care and coordination with other care providers 2. 24/7 contact phone numbers for assistance for urgent and routine care needs. 3. Service will only be billed when office clinical staff spend 20 minutes or more in a month to coordinate care. 4. Only one practitioner may furnish and bill the service in a calendar month. 5.The patient may stop CCM services at any time (effective at the end of the month) by phone call to the office staff. 6. The patient will be responsible for cost sharing (co-pay) of up to 20% of the service fee (after annual deductible is met). Patient agreed to services and consent obtained.  Patient Care Team: Pincus Sanes, MD as PCP - General (Internal  Medicine) Wyline Mood, Dorothe Pea, MD as PCP - Cardiology (Cardiology) [place] Bali, MD (Inactive) (Gastroenterology) Pollyann Savoy, MD as Consulting Physician (Rheumatology) Kathyrn Sheriff, [facility] as Pharmacist (Pharmacist)  Recent office visits: 05/13/2021 - Dr. Lawerance Bach - URI- hycodan syrup ordered  02/13/2021 - Dr. Lawerance Bach - right sided neck pain/ stiffness - start heat/ voltaren gel / APAP  - referred to PT  01/03/2021 - Dr. Lawerance Bach - no medication changes - f/u in 6 months   Recent consult visits: 04/10/2021 - Dr. Denyse Amass - Sports Medicine - recommended MRI as next step  - pt declined at this time, did not feel pain was intense enough for this  02/27/2021 - Dr. Corliss Skains - Rheumatology - no changes to medications  02/27/2021 - Dr. Denyse Amass - Sports Medicine - right knee pain/ right leg pain - gabapentin 100mg  at bedtime rx'd - steroid injection given today  02/24/2021 - Dr. Wyline Mood - Cardiology - no changes to medications   Hospital visits: 05/17/2021 - ED visit - Nausea and vomiting - fosfomycin ordered at discharge   Objective:  Lab Results  Component Value Date   CREATININE 0.78 05/17/2021   BUN 10 05/17/2021   GFR 51.13 (L) 01/03/2021   GFRNONAA >60 05/17/2021   GFRAA 72 04/23/2020   NA 134 (L) 05/17/2021   K 3.7 05/17/2021   CALCIUM 9.9 05/17/2021   CO2 25 05/17/2021   GLUCOSE 117 (H) 05/17/2021    Lab Results  Component Value Date/Time   HGBA1C 6.3 01/03/2021 11:44 AM   HGBA1C 6.3 06/28/2020 12:48 PM   GFR 51.13 (L) 01/03/2021 11:44 AM   GFR 58.46 (L)  06/28/2020 12:48 PM   MICROALBUR 1.1 02/26/2016 11:02 AM   MICROALBUR 0.5 11/23/2013 03:51 PM    Last diabetic Eye exam:  Lab Results  Component Value Date/Time   HMDIABEYEEXA No Retinopathy 01/10/2020 10:51 AM    Last diabetic Foot exam:  No results found for: HMDIABFOOTEX   Lab Results  Component Value Date   CHOL 128 01/03/2021   HDL 50.70 01/03/2021   LDLCALC 69 01/03/2021   TRIG 42.0 01/03/2021   CHOLHDL 3  01/03/2021       Latest Ref Rng & Units 05/17/2021    8:58 PM 01/03/2021   11:44 AM 09/25/2020   11:11 AM  Hepatic Function  Total Protein 6.5 - 8.1 g/dL 7.7   7.5   7.1     7.2    Albumin 3.5 - 5.0 g/dL 4.0   3.8     AST 15 - 41 U/L 16   18   15     ALT 0 - 44 U/L 14   13   11     Alk Phosphatase 38 - 126 U/L 62   62     Total Bilirubin 0.3 - 1.2 mg/dL 0.5   0.5   0.4      Lab Results  Component Value Date/Time   TSH 1.515 01/05/2020 03:23 PM   TSH 1.74 12/27/2019 12:43 PM   TSH 2.31 02/26/2016 11:02 AM       Latest Ref Rng & Units 05/17/2021    8:58 PM 11/11/2020    1:47 PM 09/25/2020   11:11 AM  CBC  WBC 4.0 - 10.5 K/uL 3.7   3.4   3.3    Hemoglobin 12.0 - 15.0 g/dL 96.0   45.4   09.8    Hematocrit 36.0 - 46.0 % 37.6   36.7   37.5    Platelets 150 - 400 K/uL 206   205   207      Lab Results  Component Value Date/Time   VD25OH 35 06/10/2009 06:06 PM    Clinical ASCVD: No  The ASCVD Risk score (Arnett DK, et al., 2019) failed to calculate for the following reasons:   The valid total cholesterol range is 130 to 320 mg/dL       11/91/4782   95:62 AM 12/27/2019   12:07 PM 12/06/2018   11:06 AM  Depression screen PHQ 2/9  Decreased Interest 0 0 0  Down, Depressed, Hopeless 0 0 0  PHQ - 2 Score 0 0 0     Social History   Tobacco Use  Smoking Status Never   Passive exposure: Never  Smokeless Tobacco Never   BP Readings from Last 3 Encounters:  05/27/21 122/83  05/18/21 (!) 141/68  05/13/21 140/80   Pulse Readings from Last 3 Encounters:  05/18/21 64  05/13/21 80  04/10/21 64   Wt Readings from Last 3 Encounters:  05/17/21 186 lb 8.2 oz (84.6 kg)  05/13/21 186 lb 9.6 oz (84.6 kg)  04/10/21 191 lb 9.6 oz (86.9 kg)   BMI Readings from Last 3 Encounters:  05/17/21 27.54 kg/m  05/13/21 27.56 kg/m  04/10/21 28.29 kg/m    Assessment/Interventions: Review of patient past medical history, allergies, medications, health status, including review of  consultants reports, laboratory and other test data, was performed as part of comprehensive evaluation and provision of chronic care management services.   SDOH:  (Social Determinants of Health) assessments and interventions performed: Yes  SDOH Screenings   Alcohol Screen: Not on file  Depression (PHQ2-9): Low Risk    PHQ-2 Score: 0  Financial Resource Strain: Not on file  Food Insecurity: Not on file  Housing: Not on file  Physical Activity: Not on file  Social Connections: Not on file  Stress: Not on file  Tobacco Use: Low Risk    Smoking Tobacco Use: Never   Smokeless Tobacco Use: Never   Passive Exposure: Never  Transportation Needs: Not on file    CCM Care Plan  Allergies  Allergen Reactions   Levofloxacin Hives and Itching   Nitrofurantoin Hives and Itching   Penicillins Hives and Itching   Sulfonamide Derivatives Itching   Pantoprazole Other (See Comments)    Bad dreams    Medications Reviewed Today     Reviewed by Ellin Saba, [facility] (Pharmacist) on 05/27/21 at 1007  Med List Status: <None>   Medication Order Taking? Sig Documenting Provider Last Dose Status Informant  apixaban (ELIQUIS) 5 MG TABS tablet [id number] Yes Take 1 tablet (5 mg total) by mouth 2 (two) times daily. Antoine Poche, MD Taking Active   atorvastatin (LIPITOR) 10 MG tablet [id number] Yes TAKE 1 TABLET BY MOUTH DAILY Burns, Bobette Mo, MD Taking Active   benazepril (LOTENSIN) 40 MG tablet [id number] Yes Take 1 tablet by mouth daily Burns, Bobette Mo, MD Taking Active   Blood Glucose Monitoring Suppl [facility] VERIO) w/Device Andria Rhein [id number] Yes USE TO TEST BLOOD SUGAR ONCE DAILY AS DIRECTED Pincus Sanes, MD Taking Active Self  diclofenac sodium (VOLTAREN) 1 % GEL [id number] Yes Apply 3 g to 3 large joints up to 3 times daily. Gearldine Bienenstock, PA-C Taking Active Self  DILT-XR 120 MG 24 hr capsule [id number] Yes TAKE 1 CAPSULE BY MOUTH EVERY DAY Dunn, Dayna N, PA-C Taking Active   fluticasone  (FLONASE) 50 MCG/ACT nasal spray [id number] Yes Place 1 spray into both nostrils as needed for allergies or rhinitis. [provider] Taking Active   gabapentin (NEURONTIN) 100 MG capsule [id number] No Take 1 capsule (100 mg total) by mouth at bedtime.  Patient not taking: Reported on 05/13/2021   Rodolph Bong, MD Not Taking Active   Ginger, Zingiber officinalis, 550 MG CAPS [id number] Yes Take 1 capsule (550 mg total) by mouth daily.  Patient taking differently: Take 1 capsule by mouth daily as needed.   Pincus Sanes, MD Taking Active   glucose blood test strip [id number]  USE TO TEST BLOOD SUGAR ONCE DAILY. E11.40 Pincus Sanes, MD  Active   hydroxychloroquine (PLAQUENIL) 200 MG tablet [id number] Yes Take 1 tablet (200 mg total) by mouth daily. Gearldine Bienenstock, PA-C Taking Active   loratadine (CLARITIN) 10 MG tablet [id number] Yes TAKE 1 TABLET BY MOUTH EVERY DAY Kerri Perches, MD Taking Active   metoprolol tartrate (LOPRESSOR) 25 MG tablet [id number] Yes TAKE 1 TABLET BY MOUTH TWICE DAILY Branch, Dorothe Pea, MD Taking Active   Multiple Vitamins-Minerals (CENTRUM SILVER 50+WOMEN) TABS [id number] Yes Take 1 tablet by mouth daily at 6 (six) AM. Lawerance Bach, Bobette Mo, MD Taking Active Self  nitroGLYCERIN (NITROSTAT) 0.4 MG SL tablet [id number]  Place 1 tablet (0.4 mg total) under the tongue every 5 (five) minutes as needed for chest pain. Abelino Derrick, PA-C  Active Self  Omega-3 Fatty Acids (FISH OIL) 1000 MG CAPS [id number] Yes Take 1,000 mg by mouth daily. [provider] Taking Active Self  omeprazole (PRILOSEC) 40 MG capsule [id number] Yes TAKE ONE CAPSULE BY MOUTH DAILY Burns, Bobette Mo, MD Taking  Active   ONETOUCH DELICA LANCETS 30G MISC [id number] Yes USE TO TEST ONCE DAILY Pincus Sanes, MD Taking Active Self  [facility] VERIO test strip [id number] Yes USE TO TEST BLOOD SUGAR ONCE DAILY Burns, Bobette Mo, MD Taking Active Self  Turmeric (QC TUMERIC COMPLEX) 500 MG CAPS [id number] Yes Take 1  capsule by mouth daily. Pincus Sanes, MD Taking Active             Patient Active Problem List   Diagnosis Date Noted   URI (upper respiratory infection) 05/13/2021   Cervical radiculopathy due to degenerative joint disease of spine 02/13/2021   Right-sided chest pain 02/13/2021   Bilateral primary osteoarthritis of knee 02/13/2021   Right knee pain 01/03/2021   Radiculopathy 01/03/2021   Atrial flutter (HCC) 08/22/2020   Aortic atherosclerosis (HCC) 06/28/2020   Secondary hypercoagulable state (HCC) 12/26/2019   Anticoagulated 12/05/2019   PAF (paroxysmal atrial fibrillation) (HCC) 08/04/2019   Thrush 08/02/2019   DOE (dyspnea on exertion) 04/03/2019   Ankle swelling, left 04/03/2019   Hyperpigmented skin lesion 04/03/2019   Poor balance 12/06/2018   Headache 12/06/2018   Vertigo 06/07/2018   Urinary frequency 06/07/2018   Lower abdominal pain 06/07/2018   Herpes zoster without complication 03/23/2018   Tingling in extremities 11/26/2017   Ischial bursitis of left side 06/02/2017   Sicca syndrome (HCC) 06/23/2016   Bilateral leg edema 04/07/2016   Burning sensation of skin 04/07/2016   PMR (polymyalgia rheumatica) (HCC) 03/27/2016   Osteopenia 03/04/2016   Autoimmune disease (HCC) 03/18/2015   Inflammatory arthritis 03/18/2015   Joint pain 11/22/2014   Overweight (BMI 25.0-29.9) 08/18/2014   Low back pain with radiation 08/18/2014   Hyperlipidemia 06/28/2014   Chest pain with moderate risk of acute coronary syndrome 04/11/2014   Tendonitis of shoulder 08/29/2013   S/P right mastectomy 11/14/2012   Type 2 diabetes, controlled, with neuropathy (HCC) 10/05/2012   Allergic rhinitis 01/26/2011   GERD 11/10/2009   INTERSTITIAL CYSTITIS 10/31/2008   BENIGN NEOPLASM OF VULVA 02/01/2008   Essential hypertension, benign 02/08/2007   History of cancer of right breast 02/08/2007    Immunization History  Administered Date(s) Administered   Fluad Quad(high Dose 65+)  12/06/2018, 12/27/2019, 01/03/2021   Influenza Split 11/14/2010   Influenza Whole 10/18/2006, 10/31/2008, 11/06/2009   Influenza, High Dose Seasonal PF 11/22/2014, 02/21/2016, 12/18/2016, 11/05/2017   Influenza,inj,Quad PF,6+ Mos 11/14/2012, 11/23/2013   Moderna Sars-Covid-2 Vaccination 03/16/2019, 04/13/2019   Pneumococcal Conjugate-13 04/11/2014   Pneumococcal Polysaccharide-23 09/17/2011   Td 07/05/2003   Zoster Recombinat (Shingrix) 11/30/2017, 02/02/2018   Zoster, Live 06/15/2013    Conditions to be addressed/monitored:  Hypertension, Hyperlipidemia, Diabetes, Atrial Fibrillation, and Polymyalgia rheumatica  Care Plan : CCM Pharmacy Care Plan  Updates made by Ellin Saba, RPH since 05/27/2021 12:00 AM     Problem: HTN, Afib, HLD   Priority: High  Onset Date: 05/27/2021     Long-Range Goal: Disease Management   Start Date: 05/27/2021  Expected End Date: 05/28/2022  This Visit's Progress: On track  Priority: High  Note:   Current Barriers:  Chronic Disease Management support, education, and care coordination needs related to Hypertension, Hyperlipidemia, Diabetes (diet controlled), and Afib   Hypertension Controlled- BP controlled at home, ranging from 110-130/80-90 BP Readings from Last 3 Encounters:  05/18/21 (!) 141/68  05/13/21 140/80  04/10/21 (!) 150/88  Pharmacist Clinical Goal(s): Patient will work with PharmD and providers to maintain BP goal <140/90 Current regimen:  Benazepril 40 mg daily  Diltiazem XR 120mg  daily  Metoprolol tartrate 25mg  twice daily  Interventions: Discussed BP goals and benefits of medication for prevention of heart attack / stroke Patient self care activities -Patient will: Check BP daily, document, and provide at future appointments Ensure daily salt intake < 2300 mg/day  Hyperlipidemia Controlled Lab Results  Component Value Date/Time   LDLCALC 69 01/03/2021 11:44 AM  Pharmacist Clinical Goal(s): Patient will work with PharmD  and providers to maintain LDL goal < 100 Current regimen:  Atorvastatin 10 mg daily OTC Fish oil 1000 mg daily Interventions: Discussed cholesterol goals and benefits of medication for prevention of heart attack / stroke Patient self care activities - Patient will: Continue current medications Continue low choelsterol diet  Diabetes Controlled- Diet  Lab Results  Component Value Date/Time   HGBA1C 6.3 01/03/2021 11:44 AM   HGBA1C 6.3 06/28/2020 12:48 PM  Pharmacist Clinical Goal(s): Patient will work with PharmD and providers to maintain A1c goal <7% Current regimen:  No medications Interventions: Discussed A1c, blood sugar goals and benefits of medication for prevention of diabetic complications Patient self care activities - Patient will: Check blood sugar in the morning before eating or drinking, document, and provide at future appointments Contact provider with any episodes of hypoglycemia  Atrial Fibrilliation Controlled  Pharmacist Clinical Goal(s) Patient will work with PharmD and providers to optimize therapy Current regimen:  Diltiazem XR 120mg  daily  Metoprolol tartrate 25mg  twice daily  Eliquis 5 mg BID Interventions: Discussed how Afib can increase risk for stroke Discussed how carvedilol helps prevent Afib, and Eliquis helps prevent strokes Patient self care activities Patient will: Continue medications as prescribed Monitor for signs/symptoms of bleeding  Medication management Pharmacist Clinical Goal(s): Patient will work with PharmD and providers to maintain optimal medication adherence Current pharmacy: [facility] Drug Interventions Comprehensive medication review performed. Continue current medication management strategy Patient self care activities - Patient will: Focus on medication adherence by pill box Take medications as prescribed Report any questions or concerns to PharmD and/or provider(s)       Medication Assistance: None required.  Patient  affirms current coverage meets needs.  Care Gaps: COVID booster  Foot Exam Eye Exam   Patient's preferred pharmacy is:  [facility] [address], [place] - [address] [place] [address] Phone: [phone] Fax: [phone]   Uses pill box? Yes Pt endorses 100% compliance  Care Plan and Follow Up Patient Decision:  Patient agrees to Care Plan and Follow-up.  Plan: Telephone follow up appointment with care management team member scheduled for:  6 months The patient has been provided with contact information for the care management team and has been advised to call with any health related questions or concerns.   Ellin Saba, PharmD Clinical Pharmacist,  [facility]

## 2021-05-27 NOTE — Patient Instructions (Signed)
Visit Information ? ?Following are the goals we discussed today:  ? ?Manage My Medicine  ? ?Timeframe:  Long-Range Goal ?Priority:  Medium ?Start Date:     05/27/2021                      ?Expected End Date: 05/28/2022                     ? ?Follow Up Date 11/2021 ?  ?- call for medicine refill 2 or 3 days before it runs out ?- call if I am sick and can't take my medicine ?- keep a list of all the medicines I take; vitamins and herbals too ?- learn to read medicine labels ?- use a pillbox to sort medicine  ?  ?Why is this important?   ?These steps will help you keep on track with your medicines. ? ?Plan: Telephone follow up appointment with care management team member scheduled for:  6 months ?The patient has been provided with contact information for the care management team and has been advised to call with any health related questions or concerns.  ? ?Tomasa Blase, PharmD ?Clinical Pharmacist, [place] ? ?Please call the care guide team at [phone] if you need to cancel or reschedule your appointment.  ? ?Patient verbalizes understanding of instructions and care plan provided today and agrees to view in [place]. Active MyChart status confirmed with patient.   ? ?

## 2021-06-03 DIAGNOSIS — C50911 Malignant neoplasm of unspecified site of right female breast: Secondary | ICD-10-CM | POA: Diagnosis not present

## 2021-06-06 ENCOUNTER — Encounter: Payer: Self-pay | Admitting: Internal Medicine

## 2021-06-09 ENCOUNTER — Encounter: Payer: Self-pay | Admitting: Internal Medicine

## 2021-06-09 ENCOUNTER — Ambulatory Visit (INDEPENDENT_AMBULATORY_CARE_PROVIDER_SITE_OTHER): Payer: Medicare Other | Admitting: Internal Medicine

## 2021-06-09 VITALS — BP 140/88 | HR 58 | Temp 97.9°F | Ht 69.0 in | Wt 189.0 lb

## 2021-06-09 DIAGNOSIS — W57XXXA Bitten or stung by nonvenomous insect and other nonvenomous arthropods, initial encounter: Secondary | ICD-10-CM | POA: Diagnosis not present

## 2021-06-09 DIAGNOSIS — S00461A Insect bite (nonvenomous) of right ear, initial encounter: Secondary | ICD-10-CM

## 2021-06-09 DIAGNOSIS — R350 Frequency of micturition: Secondary | ICD-10-CM | POA: Diagnosis not present

## 2021-06-09 DIAGNOSIS — S0096XA Insect bite (nonvenomous) of unspecified part of head, initial encounter: Secondary | ICD-10-CM | POA: Insufficient documentation

## 2021-06-09 DIAGNOSIS — I10 Essential (primary) hypertension: Secondary | ICD-10-CM | POA: Diagnosis not present

## 2021-06-09 LAB — URINALYSIS, ROUTINE W REFLEX MICROSCOPIC
Bilirubin Urine: NEGATIVE
Hgb urine dipstick: NEGATIVE
Ketones, ur: NEGATIVE
Leukocytes,Ua: NEGATIVE
Nitrite: NEGATIVE
Specific Gravity, Urine: 1.01 (ref 1.000–1.030)
Total Protein, Urine: NEGATIVE
Urine Glucose: NEGATIVE
Urobilinogen, UA: 0.2 (ref 0.0–1.0)
pH: 8 (ref 5.0–8.0)

## 2021-06-09 NOTE — Assessment & Plan Note (Signed)
Acute Increased frequency - no other urinary symptoms Check UA, Ucx to r/o UTI

## 2021-06-09 NOTE — Progress Notes (Signed)
Subjective:    Patient ID: Angela Simmons, female    DOB: 07/16/1945, 76 y.o.   MRN: [id number]      HPI [name] is here for  Chief Complaint  Patient presents with   Tick Removal    Tick removed right ear (Friday)     5/19 - removed tick off - it was behind her ear.  She is not sure how long it was on.  The area still itches and hurts a little.  She denies any fever, rashes, headaches that are different, severe fatigue, new body pains.   Increased urinary frequency - she has noticed this recently.  She denies other symptoms.  She was concerned she may have a uti.     Medications and allergies reviewed with patient and updated if appropriate.  Current Outpatient Medications on File Prior to Visit  Medication Sig Dispense Refill   apixaban (ELIQUIS) 5 MG TABS tablet Take 1 tablet (5 mg total) by mouth 2 (two) times daily. 42 tablet 0   atorvastatin (LIPITOR) 10 MG tablet TAKE 1 TABLET BY MOUTH DAILY 90 tablet 1   benazepril (LOTENSIN) 40 MG tablet Take 1 tablet by mouth daily 90 tablet 0   Blood Glucose Monitoring Suppl (ONETOUCH VERIO) w/Device KIT USE TO TEST BLOOD SUGAR ONCE DAILY AS DIRECTED 1 kit 0   diclofenac sodium (VOLTAREN) 1 % GEL Apply 3 g to 3 large joints up to 3 times daily. 3 Tube 3   DILT-XR 120 MG 24 hr capsule TAKE 1 CAPSULE BY MOUTH EVERY DAY 90 capsule 3   fluticasone (FLONASE) 50 MCG/ACT nasal spray Place 1 spray into both nostrils as needed for allergies or rhinitis.     glucose blood test strip USE TO TEST BLOOD SUGAR ONCE DAILY. E11.40 100 each 3   hydroxychloroquine (PLAQUENIL) 200 MG tablet Take 1 tablet (200 mg total) by mouth daily. 90 tablet 0   loratadine (CLARITIN) 10 MG tablet TAKE 1 TABLET BY MOUTH EVERY DAY 30 tablet 2   metoprolol tartrate (LOPRESSOR) 25 MG tablet TAKE 1 TABLET BY MOUTH TWICE DAILY 60 tablet 6   Multiple Vitamins-Minerals (CENTRUM SILVER 50+WOMEN) TABS Take 1 tablet by mouth daily at 6 (six) AM.     Omega-3 Fatty Acids (FISH  OIL) 1000 MG CAPS Take 1,000 mg by mouth daily.     omeprazole (PRILOSEC) 40 MG capsule TAKE ONE CAPSULE BY MOUTH DAILY 90 capsule 3   ONETOUCH DELICA LANCETS 85Y MISC USE TO TEST ONCE DAILY 100 each 2   ONETOUCH VERIO test strip USE TO TEST BLOOD SUGAR ONCE DAILY 100 each 0   Turmeric (QC TUMERIC COMPLEX) 500 MG CAPS Take 1 capsule by mouth daily.     nitroGLYCERIN (NITROSTAT) 0.4 MG SL tablet Place 1 tablet (0.4 mg total) under the tongue every 5 (five) minutes as needed for chest pain. (Patient not taking: Reported on 06/09/2021) 25 tablet 3   No current facility-administered medications on file prior to visit.    Review of Systems  Constitutional:  Negative for fever.  Gastrointestinal:  Negative for abdominal pain and nausea.  Genitourinary:  Positive for frequency. Negative for dysuria and hematuria.  Musculoskeletal:  Positive for back pain (lower back).  Neurological:  Positive for headaches (occ).      Objective:   Vitals:   06/09/21 1032  BP: 140/88  Pulse: (!) 58  Temp: 97.9 F (36.6 C)  SpO2: 99%   BP Readings from Last 3 Encounters:  06/09/21 140/88  05/27/21 122/83  05/18/21 (!) 141/68   Wt Readings from Last 3 Encounters:  06/09/21 189 lb (85.7 kg)  05/17/21 186 lb 8.2 oz (84.6 kg)  05/13/21 186 lb 9.6 oz (84.6 kg)   Body mass index is 27.91 kg/m.    Physical Exam Constitutional:      Appearance: Normal appearance.  HENT:     Head: Normocephalic and atraumatic.     Ears:     Comments: Posterior right upper ear with minimal tenderness,  no rash, obvious insect bite or swelling Skin:    General: Skin is warm and dry.     Findings: No rash.  Neurological:     Mental Status: She is alert. Mental status is at baseline.           Assessment & Plan:    See Problem List for Assessment and Plan of chronic medical problems.

## 2021-06-09 NOTE — Assessment & Plan Note (Addendum)
Acute Tick removed from posterior right ear 3 days ago She did bring in the tick and it looks like a deer tick No concerning infection or findings at tick bite site No concerning symptoms of lyme or other tick borne disease - reviewed them She is likely low risk for lyme or other tick borne disease Discussed options - checking labs in a few weeks, empiric treatment, etc We will not do anything at this time and she will monitor for symptoms and let us know immediately if she has any

## 2021-06-09 NOTE — Assessment & Plan Note (Signed)
Acute Tick removed from posterior right ear 3 days ago No evidence of infection

## 2021-06-09 NOTE — Patient Instructions (Addendum)
Lyme Disease Lyme disease is an infection that can affect many parts of the body, including the skin, joints, and nervous system. It is a bacterial infection that starts from the bite of an infected tick. Over time, the infection can worsen, and some of the symptoms are similar to the flu. If Lyme disease is not treated, it may cause joint pain, swelling, numbness, problems thinking, fatigue, muscle weakness, and other problems. What are the causes? This condition is caused by bacteria called Borrelia burgdorferi. You can get Lyme disease by being bitten by an infected tick. Only black-legged, or Ixodes, ticks that are infected with the bacteria can cause Lyme disease. The tick must be attached to your skin for a certain period of time to pass along the infection. This is usually 36-48 hours. Deer often carry infected ticks. What increases the risk? The following factors may make you more likely to develop this condition: Living in or visiting these areas in the [place]. [place]. [place]. Spending time in wooded or grassy areas. Being outdoors with exposed skin. Camping, gardening, hiking, fishing, hunting, or working outdoors. Failing to remove a tick from your skin. What are the signs or symptoms? Symptoms of this condition may include: Chills and fever. Headache. Fatigue. General achiness. Muscle pain. Joint pain, often in the knees. A round, red rash that surrounds the center of the tick bite. The center of the rash may be blood colored or have tiny blisters. Swollen lymph glands. Stiff neck. How is this diagnosed? This condition is diagnosed based on: Your symptoms and medical history. A physical exam. A blood test. How is this treated? The main treatment for this condition is antibiotic medicine, which is usually taken by mouth (orally). The length of treatment depends on how soon after a tick bite you begin taking the medicine. In some  cases, treatment is necessary for several weeks. If the infection is severe, antibiotics may need to be given through an IV that is inserted into one of your veins. Follow these instructions at home: Take over-the-counter and prescription medicines only as told by your health care provider. Finish all antibiotic medicine, even when you start to feel better. Ask your health care provider about taking a probiotic in between doses of your antibiotic to help avoid an upset stomach or diarrhea. Check with your health care provider before supplementing your treatment. Many alternative therapies have not been proven and may be harmful to you. Keep all follow-up visits as told by your health care provider. This is important. How is this prevented? You can become reinfected if you get another tick bite from an infected tick. Take these steps to help prevent an infection: Cover your skin with light-colored clothing when you are outdoors in the spring and summer months. Spray clothing and skin with bug spray. The spray should be 20-30% DEET. You can also treat clothing with permethrin, and let it dry before you wear it. Do not apply permethrin directly to your skin. Permethrin can also be used to treat camping gear and boots. Always read and follow the instructions that come with a bug spray or insecticide. Avoid wooded, grassy, and shaded areas. Remove yard litter, brush, trash, and plants that attract deer and rodents. Check yourself for ticks when you come indoors. Wash clothing worn each day. Shower after spending time outdoors. Check your pets for ticks before they come inside. If you find a tick attached to your skin: Remove it with tweezers.   Clean your hands and the bite area with rubbing alcohol or soap and water. Dispose of the tick by putting it in rubbing alcohol, putting it in a sealed bag or container, or flushing it down the toilet. You may choose to save the tick in a sealed container if you  wish for it to be tested at a later time. Pregnant women should take special care to avoid tick bites because it is possible that the infection may be passed along to the fetus. Contact a health care provider if: You have symptoms after treatment. You have removed a tick and want to bring it to your health care provider for testing. Get help right away if: You have an irregular heartbeat. You have chest pain. You have nerve pain. Your face feels numb. You develop the following: A stiff neck. A severe headache. Severe nausea and vomiting. Sensitivity to light. Summary Lyme disease is an infection that can affect many parts of the body, including the skin, joints, and nervous system. This condition is caused by bacteria called Borrelia burgdorferi. You can get Lyme disease by being bitten by an infected tick. The main treatment for this condition is antibiotic medicine. This information is not intended to replace advice given to you by your health care provider. Make sure you discuss any questions you have with your health care provider. Document Revised: 04/29/2018 Document Reviewed: 03/24/2018 Elsevier Patient Education  2023 [employer]  

## 2021-06-09 NOTE — Assessment & Plan Note (Signed)
Chronic BP borderline high here today  Continue benazepril 40 mg daily, metoprolol 25 mg twice daily

## 2021-06-10 LAB — URINE CULTURE: Result:: NO GROWTH

## 2021-06-11 ENCOUNTER — Other Ambulatory Visit: Payer: Self-pay | Admitting: Cardiology

## 2021-06-11 ENCOUNTER — Other Ambulatory Visit: Payer: Self-pay | Admitting: Physician Assistant

## 2021-06-11 NOTE — Telephone Encounter (Signed)
Pt last saw Dr Harl Bowie 02/24/21, last labs 05/17/21 Creat 0.78, age 76, weight 85.7kg, based on specified criteria pt is on appropriate dosage of Eliquis '5mg'$  BID.  Will refill rx.

## 2021-06-11 NOTE — Telephone Encounter (Signed)
Next Visit: 07/29/2021  Last Visit: 02/27/2021  Labs: 05/17/2021 WBC 3.7, Sodium 134, Glucose 117  Eye exam: 11/19/2020 WNL    Current Dose per office note 02/27/2021: Plaquenil 200 mg 1 tablet by mouth daily Monday to Friday  YB:WLSLHTD'S syndrome with keratoconjunctivitis sicca   Last Fill: 01/17/2021  Okay to refill Plaquenil?

## 2021-06-17 ENCOUNTER — Encounter: Payer: Self-pay | Admitting: Internal Medicine

## 2021-06-17 DIAGNOSIS — H9193 Unspecified hearing loss, bilateral: Secondary | ICD-10-CM

## 2021-06-18 DIAGNOSIS — I10 Essential (primary) hypertension: Secondary | ICD-10-CM

## 2021-06-18 DIAGNOSIS — E785 Hyperlipidemia, unspecified: Secondary | ICD-10-CM | POA: Diagnosis not present

## 2021-06-18 DIAGNOSIS — E1159 Type 2 diabetes mellitus with other circulatory complications: Secondary | ICD-10-CM

## 2021-06-18 DIAGNOSIS — I4891 Unspecified atrial fibrillation: Secondary | ICD-10-CM | POA: Diagnosis not present

## 2021-06-18 DIAGNOSIS — H40021 Open angle with borderline findings, high risk, right eye: Secondary | ICD-10-CM | POA: Diagnosis not present

## 2021-06-19 ENCOUNTER — Telehealth: Payer: Self-pay

## 2021-06-19 NOTE — Chronic Care Management (AMB) (Unsigned)
Chronic Care Management Pharmacy Assistant   Name: NIDYA BOUYER  MRN: [id number] DOB: October 16, 1945   Reason for Encounter: Disease State   Conditions to be addressed/monitored: HTN   Recent office visits:  06/09/21 Binnie Rail, MD-PCP (Urine frequency) Orders: Urinalysis, Routine w reflex microscopic; Medication changes: none  Recent consult visits:  None since the last coordination call 05/27/21  Hospital visits:  None since the last coordination call 05/27/21  Medications: Outpatient Encounter Medications as of 06/19/2021  Medication Sig   apixaban (ELIQUIS) 5 MG TABS tablet Take 1 tablet by mouth twice daily   atorvastatin (LIPITOR) 10 MG tablet TAKE 1 TABLET BY MOUTH DAILY   benazepril (LOTENSIN) 40 MG tablet Take 1 tablet by mouth daily   Blood Glucose Monitoring Suppl (ONETOUCH VERIO) w/Device KIT USE TO TEST BLOOD SUGAR ONCE DAILY AS DIRECTED   diclofenac sodium (VOLTAREN) 1 % GEL Apply 3 g to 3 large joints up to 3 times daily.   DILT-XR 120 MG 24 hr capsule TAKE 1 CAPSULE BY MOUTH EVERY DAY   fluticasone (FLONASE) 50 MCG/ACT nasal spray Place 1 spray into both nostrils as needed for allergies or rhinitis.   glucose blood test strip USE TO TEST BLOOD SUGAR ONCE DAILY. E11.40   hydroxychloroquine (PLAQUENIL) 200 MG tablet TAKE 1 TABLET BY MOUTH ONCE DAILY ON MONDAY THRU FRIDAY   loratadine (CLARITIN) 10 MG tablet TAKE 1 TABLET BY MOUTH EVERY DAY   metoprolol tartrate (LOPRESSOR) 25 MG tablet TAKE 1 TABLET BY MOUTH TWICE DAILY   Multiple Vitamins-Minerals (CENTRUM SILVER 50+WOMEN) TABS Take 1 tablet by mouth daily at 6 (six) AM.   nitroGLYCERIN (NITROSTAT) 0.4 MG SL tablet Place 1 tablet (0.4 mg total) under the tongue every 5 (five) minutes as needed for chest pain. (Patient not taking: Reported on 06/09/2021)   Omega-3 Fatty Acids (FISH OIL) 1000 MG CAPS Take 1,000 mg by mouth daily.   omeprazole (PRILOSEC) 40 MG capsule TAKE ONE CAPSULE BY MOUTH DAILY   ONETOUCH DELICA  LANCETS 89H MISC USE TO TEST ONCE DAILY   ONETOUCH VERIO test strip USE TO TEST BLOOD SUGAR ONCE DAILY   Turmeric (QC TUMERIC COMPLEX) 500 MG CAPS Take 1 capsule by mouth daily.   No facility-administered encounter medications on file as of 06/19/2021.   Reviewed chart prior to disease state call. Spoke with patient regarding BP  Recent Office Vitals: BP Readings from Last 3 Encounters:  06/09/21 140/88  05/27/21 122/83  05/18/21 (!) 141/68   Pulse Readings from Last 3 Encounters:  06/09/21 (!) 58  05/18/21 64  05/13/21 80    Wt Readings from Last 3 Encounters:  06/09/21 189 lb (85.7 kg)  05/17/21 186 lb 8.2 oz (84.6 kg)  05/13/21 186 lb 9.6 oz (84.6 kg)     Kidney Function Lab Results  Component Value Date/Time   CREATININE 0.78 05/17/2021 08:58 PM   CREATININE 1.07 01/03/2021 11:44 AM   CREATININE 1.12 (H) 09/25/2020 11:11 AM   CREATININE 0.92 04/23/2020 11:44 AM   GFR 51.13 (L) 01/03/2021 11:44 AM   GFRNONAA >60 05/17/2021 08:58 PM   GFRNONAA 62 04/23/2020 11:44 AM   GFRAA 72 04/23/2020 11:44 AM       Latest Ref Rng & Units 05/17/2021    8:58 PM 01/03/2021   11:44 AM 09/25/2020   11:11 AM  BMP  Glucose 70 - 99 mg/dL 117   79   118    BUN 8 - 23 mg/dL 10  14   13    Creatinine 0.44 - 1.00 mg/dL 0.78   1.07   1.12    BUN/Creat Ratio 6 - 22 (calc)   12    Sodium 135 - 145 mmol/L 134   139   141    Potassium 3.5 - 5.1 mmol/L 3.7   4.0   4.2    Chloride 98 - 111 mmol/L 99   104   106    CO2 22 - 32 mmol/L 25   30   28     Calcium 8.9 - 10.3 mg/dL 9.9   9.6   9.1     Reviewed chart prior to disease state call. Spoke with patient regarding BP  Current antihypertensive regimen:  Benazepril 40 mg Metoprolol tartrate 25 mg  How often are you checking your Blood Pressure? {CHL HP BP Monitoring Frequency:[phone]}  Current home BP readings: ***  What recent interventions/DTPs have been made by any provider to improve Blood Pressure control since last CPP Visit:  ***  Any recent hospitalizations or ED visits since last visit with CPP? {yes/no:20286}  What diet changes have been made to improve Blood Pressure Control?  ***  What exercise is being done to improve your Blood Pressure Control?  ***  Adherence Review: Is the patient currently on ACE/ARB medication? Yes Does the patient have >5 day gap between last estimated fill dates? No   Care Gaps: Colonoscopy-06/22/11 Diabetic Foot Exam-06/21/19 Mammogram-03/20/20 Ophthalmology-01/10/20 Dexa Scan - 08/26/20 Annual Well Visit - NA Micro albumin-NA Hemoglobin A1c- 01/03/21   Star Rating Drugs: Atorvastatin 10 mg-last fill 05/28/21 90 ds Benazepril 40 mg-last fill 05/13/21 90 ds     Ethelene Hal Clinical Pharmacist Assistant [phone]

## 2021-07-03 ENCOUNTER — Encounter: Payer: Self-pay | Admitting: Internal Medicine

## 2021-07-03 NOTE — Patient Instructions (Addendum)
     Blood work was ordered.     Medications changes include :   none     Return in about 6 months (around 01/03/2022) for Physical Exam.

## 2021-07-03 NOTE — Progress Notes (Signed)
Subjective:    Patient ID: Angela Simmons, female    DOB: 01-Nov-1945, 76 y.o.   MRN: [id number]     HPI Angela Simmons is here for follow up of her chronic medical problems, including htn, hld, DM, gerd  No changes since she was here last.  She walks 2 miles a day.     Left medial ankle slightly purplish discoloration and mildly swollen.  Has arthritis of the knee.  This does limit her sometimes when walking.  Medications and allergies reviewed with patient and updated if appropriate.  Current Outpatient Medications on File Prior to Visit  Medication Sig Dispense Refill   apixaban (ELIQUIS) 5 MG TABS tablet Take 1 tablet by mouth twice daily 60 tablet 5   atorvastatin (LIPITOR) 10 MG tablet TAKE 1 TABLET BY MOUTH DAILY 90 tablet 1   benazepril (LOTENSIN) 40 MG tablet Take 1 tablet by mouth daily 90 tablet 0   Blood Glucose Monitoring Suppl (ONETOUCH VERIO) w/Device KIT USE TO TEST BLOOD SUGAR ONCE DAILY AS DIRECTED 1 kit 0   diclofenac sodium (VOLTAREN) 1 % GEL Apply 3 g to 3 large joints up to 3 times daily. 3 Tube 3   DILT-XR 120 MG 24 hr capsule TAKE 1 CAPSULE BY MOUTH EVERY DAY 90 capsule 3   fluticasone (FLONASE) 50 MCG/ACT nasal spray Place 1 spray into both nostrils as needed for allergies or rhinitis.     glucose blood test strip USE TO TEST BLOOD SUGAR ONCE DAILY. E11.40 100 each 3   hydroxychloroquine (PLAQUENIL) 200 MG tablet TAKE 1 TABLET BY MOUTH ONCE DAILY ON MONDAY THRU FRIDAY 60 tablet 0   loratadine (CLARITIN) 10 MG tablet TAKE 1 TABLET BY MOUTH EVERY DAY 30 tablet 2   metoprolol tartrate (LOPRESSOR) 25 MG tablet TAKE 1 TABLET BY MOUTH TWICE DAILY 60 tablet 6   Multiple Vitamins-Minerals (CENTRUM SILVER 50+WOMEN) TABS Take 1 tablet by mouth daily at 6 (six) AM.     nitroGLYCERIN (NITROSTAT) 0.4 MG SL tablet Place 1 tablet (0.4 mg total) under the tongue every 5 (five) minutes as needed for chest pain. 25 tablet 3   Omega-3 Fatty Acids (FISH OIL) 1000 MG CAPS Take  1,000 mg by mouth daily.     omeprazole (PRILOSEC) 40 MG capsule TAKE ONE CAPSULE BY MOUTH DAILY 90 capsule 3   ONETOUCH DELICA LANCETS 78M MISC USE TO TEST ONCE DAILY 100 each 2   ONETOUCH VERIO test strip USE TO TEST BLOOD SUGAR ONCE DAILY 100 each 0   Turmeric (QC TUMERIC COMPLEX) 500 MG CAPS Take 1 capsule by mouth daily.     No current facility-administered medications on file prior to visit.     Review of Systems  Constitutional:  Negative for chills and fever.  Respiratory:  Negative for cough, shortness of breath and wheezing.   Cardiovascular:  Positive for leg swelling (left ankle > right ankle). Negative for chest pain and palpitations.  Gastrointestinal:        No gerd  Musculoskeletal:  Positive for arthralgias and neck stiffness (when she woke up this morning).  Neurological:  Positive for headaches (occ). Negative for light-headedness.       Objective:   Vitals:   07/04/21 1022  BP: 136/76  Pulse: 69  Temp: 98.3 F (36.8 C)  SpO2: 97%   BP Readings from Last 3 Encounters:  07/04/21 136/76  06/09/21 140/88  05/27/21 122/83   Wt Readings from Last 3 Encounters:  07/04/21 188 lb (85.3 kg)  06/09/21 189 lb (85.7 kg)  05/17/21 186 lb 8.2 oz (84.6 kg)   Body mass index is 27.76 kg/m.    Physical Exam Constitutional:      General: She is not in acute distress.    Appearance: Normal appearance.  HENT:     Head: Normocephalic and atraumatic.  Eyes:     Conjunctiva/sclera: Conjunctivae normal.  Cardiovascular:     Rate and Rhythm: Normal rate and regular rhythm.     Heart sounds: Normal heart sounds. No murmur heard.    Comments: Varicose veins left ankle  Pulmonary:     Effort: Pulmonary effort is normal. No respiratory distress.     Breath sounds: Normal breath sounds. No wheezing.  Musculoskeletal:     Cervical back: Neck supple.     Right lower leg: No edema.     Left lower leg: No edema.  Lymphadenopathy:     Cervical: No cervical adenopathy.   Skin:    General: Skin is warm and dry.     Findings: No rash.  Neurological:     Mental Status: She is alert. Mental status is at baseline.  Psychiatric:        Mood and Affect: Mood normal.        Behavior: Behavior normal.         Diabetic Foot Exam - Simple   Simple Foot Form Diabetic Foot exam was performed with the following findings: Yes 07/04/2021 10:54 AM  Visual Inspection No deformities, no ulcerations, no other skin breakdown bilaterally: Yes Sensation Testing See comments: Yes Pulse Check Posterior Tibialis and Dorsalis pulse intact bilaterally: Yes Comments Varicose veins left ankle> right ankle, decreased sensation left plantar surface     Lab Results  Component Value Date   WBC 3.7 (L) 05/17/2021   HGB 12.0 05/17/2021   HCT 37.6 05/17/2021   PLT 206 05/17/2021   GLUCOSE 117 (H) 05/17/2021   CHOL 128 01/03/2021   TRIG 42.0 01/03/2021   HDL 50.70 01/03/2021   LDLCALC 69 01/03/2021   ALT 14 05/17/2021   AST 16 05/17/2021   NA 134 (L) 05/17/2021   K 3.7 05/17/2021   CL 99 05/17/2021   CREATININE 0.78 05/17/2021   BUN 10 05/17/2021   CO2 25 05/17/2021   TSH 1.515 01/05/2020   HGBA1C 6.3 01/03/2021   MICROALBUR 1.1 02/26/2016     Assessment & Plan:    See Problem List for Assessment and Plan of chronic medical problems.

## 2021-07-04 ENCOUNTER — Ambulatory Visit (INDEPENDENT_AMBULATORY_CARE_PROVIDER_SITE_OTHER): Payer: Medicare Other | Admitting: Internal Medicine

## 2021-07-04 VITALS — BP 136/76 | HR 69 | Temp 98.3°F | Ht 69.0 in | Wt 188.0 lb

## 2021-07-04 DIAGNOSIS — I10 Essential (primary) hypertension: Secondary | ICD-10-CM | POA: Diagnosis not present

## 2021-07-04 DIAGNOSIS — I700 Atherosclerosis of aorta: Secondary | ICD-10-CM | POA: Diagnosis not present

## 2021-07-04 DIAGNOSIS — E7849 Other hyperlipidemia: Secondary | ICD-10-CM | POA: Diagnosis not present

## 2021-07-04 DIAGNOSIS — M1990 Unspecified osteoarthritis, unspecified site: Secondary | ICD-10-CM

## 2021-07-04 DIAGNOSIS — D6869 Other thrombophilia: Secondary | ICD-10-CM | POA: Diagnosis not present

## 2021-07-04 DIAGNOSIS — K219 Gastro-esophageal reflux disease without esophagitis: Secondary | ICD-10-CM

## 2021-07-04 DIAGNOSIS — E1140 Type 2 diabetes mellitus with diabetic neuropathy, unspecified: Secondary | ICD-10-CM

## 2021-07-04 DIAGNOSIS — I480 Paroxysmal atrial fibrillation: Secondary | ICD-10-CM | POA: Diagnosis not present

## 2021-07-04 DIAGNOSIS — M1380 Other specified arthritis, unspecified site: Secondary | ICD-10-CM

## 2021-07-04 LAB — COMPREHENSIVE METABOLIC PANEL
ALT: 15 U/L (ref 0–35)
AST: 20 U/L (ref 0–37)
Albumin: 3.9 g/dL (ref 3.5–5.2)
Alkaline Phosphatase: 61 U/L (ref 39–117)
BUN: 11 mg/dL (ref 6–23)
CO2: 31 mEq/L (ref 19–32)
Calcium: 9.5 mg/dL (ref 8.4–10.5)
Chloride: 103 mEq/L (ref 96–112)
Creatinine, Ser: 0.97 mg/dL (ref 0.40–1.20)
GFR: 57.32 mL/min — ABNORMAL LOW (ref >60.00)
Glucose, Bld: 68 mg/dL — ABNORMAL LOW (ref 70–99)
Potassium: 4.2 mEq/L (ref 3.5–5.1)
Sodium: 140 mEq/L (ref 135–145)
Total Bilirubin: 0.5 mg/dL (ref 0.2–1.2)
Total Protein: 7.6 g/dL (ref 6.0–8.3)

## 2021-07-04 LAB — LIPID PANEL
Cholesterol: 126 mg/dL (ref 0–200)
HDL: 52.2 mg/dL (ref >39.00)
LDL Cholesterol: 66 mg/dL (ref 0–99)
NonHDL: 73.57
Total CHOL/HDL Ratio: 2
Triglycerides: 39 mg/dL (ref 0.0–149.0)
VLDL: 7.8 mg/dL (ref 0.0–40.0)

## 2021-07-04 LAB — MICROALBUMIN / CREATININE URINE RATIO
Creatinine,U: 123.1 mg/dL
Microalb Creat Ratio: 0.9 mg/g (ref 0.0–30.0)
Microalb, Ur: 1.1 mg/dL (ref 0.0–1.9)

## 2021-07-04 LAB — HEMOGLOBIN A1C: Hgb A1c MFr Bld: 6.1 % (ref 4.6–6.5)

## 2021-07-04 NOTE — Assessment & Plan Note (Signed)
Chronic GERD controlled Continue omeprazole 40 mg daily 

## 2021-07-04 NOTE — Assessment & Plan Note (Signed)
Chronic Continue atorvastatin 10 mg daily Healthy diet, regular exercise encouraged

## 2021-07-04 NOTE — Assessment & Plan Note (Signed)
Chronic Secondary to paroxysmal atrial fibrillation Continue Eliquis 5 mg twice daily

## 2021-07-04 NOTE — Assessment & Plan Note (Signed)
Chronic Following with cardiology Paroxysmal A-fib Continue Eliquis and metoprolol

## 2021-07-04 NOTE — Assessment & Plan Note (Signed)
Chronic  Lab Results  Component Value Date   HGBA1C 6.3 01/03/2021   Sugars well controlled Testing sugars 1 times a day Check A1c, urine microalbumin today Continue lifestyle control Stressed regular exercise, diabetic diet

## 2021-07-04 NOTE — Assessment & Plan Note (Signed)
Chronic Regular exercise and healthy diet encouraged Check lipid panel  Continue atorvastatin 10 mg daily 

## 2021-07-04 NOTE — Assessment & Plan Note (Signed)
Chronic Blood pressure well controlled CMP Continue benazepril 40 mg daily, diltiazem 120 mg daily, Metoprolol 25 mg twice daily

## 2021-07-04 NOTE — Assessment & Plan Note (Signed)
Chronic Following with rheumatology On hydroxychloroquine 200 mg once daily Monday through Friday

## 2021-07-15 ENCOUNTER — Ambulatory Visit: Payer: Medicare Other | Admitting: Audiologist

## 2021-07-18 NOTE — Progress Notes (Signed)
Office Visit Note  Patient: Angela Simmons             Date of Birth: 1945-03-03           MRN: [id number]             PCP: Pincus Sanes, MD Referring: Pincus Sanes, MD Visit Date: 07/29/2021 Occupation: @GUAROCC @  Subjective:  Right knee joint pain   History of Present Illness: Angela Simmons is a 76 y.o. female with history of sjogren's syndrome, osteoarthritis, PMR, and DDD.  She is taking plaquenil 200 mg 1 tablet by mouth daily.  She has been tolerating Plaquenil without any side effects.  She presents today with some increased pain in the right knee joint.  She has been walking about 2 hours daily for exercise which has exacerbated her symptoms.  She denies any recent falls or injuries.  She has been using Voltaren gel topically as needed as well as applying heat as needed.  She has a copper fit brace which she wears on occasion.  She has been following up with Dr. Denyse Amass who performed an updated x-ray and a ultrasound-guided cortisone injection on 02/27/2021.  She had temporary relief with a cortisone injection but has not yet followed back up for further evaluation.  According to the patient at that time she was having increased discomfort in her lower back as well as right-sided radiculopathy.  She has not yet had an MRI of her lumbar spine.  She plans on following up with Dr. Denyse Amass for further evaluation of the symptoms she is experiencing.  She denies any other joint pain or joint swelling at this time.  She states overall her sicca symptoms have been tolerable.  She has been using eyedrops as needed and Biotene oral rinse.  She denies any swollen lymph nodes.  She will be seeing her dentist this week for routine follow-up.    Activities of Daily Living:  Patient reports morning stiffness for 30 minutes.   Patient Reports nocturnal pain.  Difficulty dressing/grooming: Denies Difficulty climbing stairs: Reports Difficulty getting out of chair: Reports Difficulty using hands for  taps, buttons, cutlery, and/or writing: Reports  Review of Systems  Constitutional:  Positive for fatigue.  HENT:  Positive for mouth dryness. Negative for mouth sores and nose dryness.   Eyes:  Positive for dryness. Negative for pain, itching and visual disturbance.  Respiratory:  Negative for cough, hemoptysis and difficulty breathing.   Cardiovascular:  Negative for hypertension and swelling in legs/feet.  Gastrointestinal:  Negative for blood in stool, constipation and diarrhea.  Endocrine: Negative for increased urination.  Genitourinary:  Negative for difficulty urinating and painful urination.  Musculoskeletal:  Positive for joint pain, joint pain, joint swelling, myalgias, morning stiffness, muscle tenderness and myalgias. Negative for muscle weakness.  Skin:  Negative for color change, pallor, rash, hair loss, nodules/bumps, redness, skin tightness, ulcers and sensitivity to sunlight.  Allergic/Immunologic: Negative for susceptible to infections.  Neurological:  Positive for headaches. Negative for dizziness and memory loss.  Hematological:  Negative for swollen glands.  Psychiatric/Behavioral:  Negative for depressed mood, confusion and sleep disturbance. The patient is not nervous/anxious.     PMFS History:  Patient Active Problem List   Diagnosis Date Noted   Insect bite of head 06/09/2021   Tick bite of ear, right, initial encounter 06/09/2021   Cervical radiculopathy due to degenerative joint disease of spine 02/13/2021   Right-sided chest pain 02/13/2021   Bilateral primary osteoarthritis  of knee 02/13/2021   Right knee pain 01/03/2021   Radiculopathy 01/03/2021   Atrial flutter (HCC) 08/22/2020   Aortic atherosclerosis (HCC) 06/28/2020   Secondary hypercoagulable state (HCC) 12/26/2019   Anticoagulated 12/05/2019   PAF (paroxysmal atrial fibrillation) (HCC) 08/04/2019   Thrush 08/02/2019   DOE (dyspnea on exertion) 04/03/2019   Ankle swelling, left 04/03/2019    Hyperpigmented skin lesion 04/03/2019   Poor balance 12/06/2018   Headache 12/06/2018   Vertigo 06/07/2018   Urine frequency 06/07/2018   Herpes zoster without complication 03/23/2018   Tingling in extremities 11/26/2017   Ischial bursitis of left side 06/02/2017   Sicca syndrome (HCC) 06/23/2016   Bilateral leg edema 04/07/2016   Burning sensation of skin 04/07/2016   PMR (polymyalgia rheumatica) (HCC) 03/27/2016   Osteopenia 03/04/2016   Autoimmune disease (HCC) 03/18/2015   Inflammatory arthritis 03/18/2015   Joint pain 11/22/2014   Overweight (BMI 25.0-29.9) 08/18/2014   Low back pain with radiation 08/18/2014   Hyperlipidemia 06/28/2014   Chest pain with moderate risk of acute coronary syndrome 04/11/2014   Tendonitis of shoulder 08/29/2013   S/P right mastectomy 11/14/2012   Type 2 diabetes, controlled, with neuropathy (HCC) 10/05/2012   Allergic rhinitis 01/26/2011   GERD 11/10/2009   INTERSTITIAL CYSTITIS 10/31/2008   BENIGN NEOPLASM OF VULVA 02/01/2008   Essential hypertension, benign 02/08/2007   History of cancer of right breast 02/08/2007    Past Medical History:  Diagnosis Date   Breast cancer (HCC) 1990s   right breast   Collagen vascular disease (HCC)    Diabetes mellitus without complication (HCC)    GERD (gastroesophageal reflux disease)    HTN (hypertension)    Hypercholesterolemia    Obesity    Osteoarthritis     Family History  Problem Relation Age of Onset   Diabetes Mother        diabetic coma   Heart failure Mother    Diabetes Father    Emphysema Father    Diabetes Sister    Hypercholesterolemia Sister    Hypercholesterolemia Brother    Diabetes Brother    Hypertension Brother    Hypercholesterolemia Brother    Diabetes Sister    Hypertension Sister    Hypercholesterolemia Sister    Hypercholesterolemia Sister    Hypercholesterolemia Sister    Diabetes Brother    Hypertension Brother    Colon cancer Neg Hx    Past Surgical  History:  Procedure Laterality Date   ABDOMINAL HYSTERECTOMY  [place]   AORTIC ARCH ANGIOGRAPHY N/A 12/07/2019   Procedure: AORTIC ARCH ANGIOGRAPHY;  Surgeon: Runell Gess, MD;  Location: [facility];  Service: Cardiovascular;  Laterality: N/A;   COLONOSCOPY  May 2003   Dr. Katrinka Blazing: normal   COLONOSCOPY  06/22/2011   Procedure: COLONOSCOPY;  Surgeon: West Bali, MD;  Location: [facility];  Service: Endoscopy;  Laterality: N/A;  10:30   LEFT HEART CATH AND CORONARY ANGIOGRAPHY N/A 12/07/2019   Procedure: LEFT HEART CATH AND CORONARY ANGIOGRAPHY;  Surgeon: Runell Gess, MD;  Location: [facility];  Service: Cardiovascular;  Laterality: N/A;   MASTECTOMY  1995   right    Social History   Social History Narrative   Lives at home with husband   Caffeine, maybe 1 a day   Immunization History  Administered Date(s) Administered   Fluad Quad(high Dose 65+) 12/06/2018, 12/27/2019, 01/03/2021   Influenza Split 11/14/2010   Influenza Whole 10/18/2006, 10/31/2008, 11/06/2009  Influenza, High Dose Seasonal PF 11/22/2014, 02/21/2016, 12/18/2016, 11/05/2017   Influenza,inj,Quad PF,6+ Mos 11/14/2012, 11/23/2013   Moderna Sars-Covid-2 Vaccination 03/16/2019, 04/13/2019   Pneumococcal Conjugate-13 04/11/2014   Pneumococcal Polysaccharide-23 09/17/2011   Td 07/05/2003   Zoster Recombinat (Shingrix) 11/30/2017, 02/02/2018   Zoster, Live 06/15/2013     Objective: Vital Signs: BP (!) 162/77 (BP Location: Left Arm, Patient Position: Sitting, Cuff Size: Normal)   Pulse (!) 59   Ht 5\' 9"  (1.753 m)   Wt 188 lb (85.3 kg)   BMI 27.76 kg/m    Physical Exam Vitals and nursing note reviewed.  Constitutional:      Appearance: She is well-developed.  HENT:     Head: Normocephalic and atraumatic.  Eyes:     Conjunctiva/sclera: Conjunctivae normal.  Cardiovascular:     Rate and Rhythm: Normal rate and regular rhythm.     Heart sounds: Normal heart sounds.   Pulmonary:     Effort: Pulmonary effort is normal.     Breath sounds: Normal breath sounds.  Abdominal:     General: Bowel sounds are normal.     Palpations: Abdomen is soft.  Musculoskeletal:     Cervical back: Normal range of motion.  Skin:    General: Skin is warm and dry.     Capillary Refill: Capillary refill takes less than 2 seconds.  Neurological:     Mental Status: She is alert and oriented to person, place, and time.  Psychiatric:        Behavior: Behavior normal.      Musculoskeletal Exam: C-spine slightly limited range of motion with lateral rotation.  Discomfort with lumbar range of motion.  Some tenderness over both SI joints.  Shoulder joints, elbow joints, wrist joints, MCPs, PIPs, DIPs have good range of motion with no synovitis.  Complete fist formation bilaterally.  PIP and DIP thickening consistent with osteoarthritis of both hands noted.  Hip joints have good range of motion with no groin pain.  Tenderness over bilateral trochanteric bursa.  Knee joints have good range of motion with some discomfort in the right knee.  No warmth or effusion of the right knee noted.  Ankle joints have good range of motion with no tenderness or joint swelling.  CDAI Exam: CDAI Score: -- Patient Global: --; Provider Global: -- Swollen: --; Tender: -- Joint Exam 07/29/2021   No joint exam has been documented for this visit   There is currently no information documented on the homunculus. Go to the Rheumatology activity and complete the homunculus joint exam.  Investigation: No additional findings.  Imaging: No results found.  Recent Labs: Lab Results  Component Value Date   WBC 3.7 (L) 05/17/2021   HGB 12.0 05/17/2021   PLT 206 05/17/2021   NA 140 07/04/2021   K 4.2 07/04/2021   CL 103 07/04/2021   CO2 31 07/04/2021   GLUCOSE 68 (L) 07/04/2021   BUN 11 07/04/2021   CREATININE 0.97 07/04/2021   BILITOT 0.5 07/04/2021   ALKPHOS 61 07/04/2021   AST 20 07/04/2021    ALT 15 07/04/2021   PROT 7.6 07/04/2021   ALBUMIN 3.9 07/04/2021   CALCIUM 9.5 07/04/2021   GFRAA 72 04/23/2020   QFTBGOLDPLUS Negative 07/25/2019    Speciality Comments: PLQ Eye Exam: 11/19/2020 WNL @ [facility] [place] up in 6 months  Procedures:  No procedures performed Allergies: Levofloxacin, Nitrofurantoin, Penicillins, Sulfonamide derivatives, and Pantoprazole     Assessment / Plan:     Visit Diagnoses: Sjogren's  syndrome with keratoconjunctivitis sicca (HCC) - ANA+, sicca symptoms: She continues to have chronic sicca symptoms.  Overall her symptoms have been tolerable.  She has been using eyedrops as needed as well as Biotene oral rinse as needed for symptomatic relief.  She has been seeing her ophthalmologist on a yearly basis and her dentist every 6 months as recommended.  She has an upcoming appointment with her dentist this week.  No cervical lymphadenopathy was palpable today. She has no signs of inflammatory arthritis at this time.  No synovitis was noted on examination today. She has been taking Plaquenil 200 mg 1 tablet by mouth daily as advised on 11/11/2020 when her hydroxychloroquine level was 360.  She was unsure if she was supposed to be taking Plaquenil every day or Monday through Friday.  She was advised to remain on Plaquenil 200 mg 1 tablet daily. Lab work on 09/25/2020 was reviewed today in the office: Hydroxychloroquine level 360, ANA positive 1:640NS, RF negative, Ro positive, La negative, ESR 39, SPEP did not reveal any abnormal proteins.  The following lab work will be obtained today for further evaluation.    She was advised to notify us if she develops any new or worsening symptoms.  She will follow up in the office in 5 months or sooner if needed.  - Plan: CBC with Differential/Platelet, COMPLETE METABOLIC PANEL WITH GFR, Urinalysis, Routine w reflex microscopic, ANA, C3 and C4, Sedimentation rate, Sjogrens syndrome-A extractable nuclear antibody, Sjogrens  syndrome-B extractable nuclear antibody, Serum protein electrophoresis with reflex, Rheumatoid factor  PMR (polymyalgia rheumatica) (HCC) - In remission.  She is not exhibiting any signs or symptoms of active disease.  She had no difficulty rising from a seated position or raising her arms above her head.  High risk medication use - Plaquenil 200 mg 1 tablet by mouth daily.  Hydroxychloroquine level was 360 on 11/11/2020.  At that time she was advised to increase Plaquenil to 200 mg 1 tablet daily.  She will remain on the current dose of Plaquenil as prescribed. PLQ Eye Exam: 11/19/2020 WNL @ [facility].  CBC updated on 05/17/21.  CMP updated on 07/04/21.  CBC and CMP were updated today.    - Plan: CBC with Differential/Platelet, COMPLETE METABOLIC PANEL WITH GFR  Paresthesia of both hands: Asymptomatic at this time.  Trochanteric bursitis of both hips: She has tenderness palpation over bilateral trochanteric bursa.  Overall her symptoms have been tolerable.  Discussed importance of performing stretching exercises daily.  Primary osteoarthritis of both knees - X-rays were consistent with bilateral moderate osteoarthritis and chondromalacia patella on 04/23/2020.  X-rays of the right knee were updated on 02/27/2021 and results were consistent with mild to moderate lateral compartment and moderate patellofemoral compartment osteoarthritis.  She has been under the care of Dr. Denyse Amass for management of right knee joint effusion.  She had an ultrasound-guided right knee joint cortisone injection on 02/27/2021.  The injection provided temporary relief but she states that her blood sugar was elevated for several weeks afterward.  She has not had any recent falls or injuries.  No mechanical symptoms.  She has been walking on a daily basis for exercise.  She has been using Voltaren gel topically as needed as well as applying heat and wearing a copper fit sleeve as needed.  Discussed that she may benefit from Visco gel  injections in the future if she had an inadequate response to cortisone as well as an elevation in her blood sugar.  She  plans on further discussing with Dr. Denyse Amass.   DDD (degenerative disc disease), cervical: She has limited range of motion of the C-spine with lateral rotation.  She experiences intermittent discomfort and stiffness in her neck.  No symptoms of radiculopathy.  Osteopenia of multiple sites - DEXA updated on 08/26/20 ordered by Dr. Lawerance Bach: LFN -1.4.   Other medical conditions are listed as follows:   History of hypertension  History of hyperlipidemia  Other fatigue  History of peripheral neuropathy  History of cancer of right breast  S/P right mastectomy  History of gastroesophageal reflux (GERD)  Orders: Orders Placed This Encounter  Procedures   CBC with Differential/Platelet   COMPLETE METABOLIC PANEL WITH GFR   Urinalysis, Routine w reflex microscopic   ANA   C3 and C4   Sedimentation rate   Sjogrens syndrome-A extractable nuclear antibody   Sjogrens syndrome-B extractable nuclear antibody   Serum protein electrophoresis with reflex   Rheumatoid factor   No orders of the defined types were placed in this encounter.     Follow-Up Instructions: Return in 5 months (on 12/29/2021) for Sjogren's syndrome, Osteoarthritis, Polymyalgia Rheumatica, DDD.   Gearldine Bienenstock, PA-C  Note - This record has been created using Dragon software.  Chart creation errors have been sought, but may not always  have been located. Such creation errors do not reflect on  the standard of medical care.

## 2021-07-24 ENCOUNTER — Other Ambulatory Visit: Payer: Self-pay | Admitting: *Deleted

## 2021-07-24 MED ORDER — METOPROLOL TARTRATE 25 MG PO TABS: 25.0000 mg | ORAL_TABLET | Freq: Two times a day (BID) | ORAL | 6 refills | Status: DC

## 2021-07-29 ENCOUNTER — Ambulatory Visit (INDEPENDENT_AMBULATORY_CARE_PROVIDER_SITE_OTHER): Payer: Medicare Other | Admitting: Physician Assistant

## 2021-07-29 ENCOUNTER — Other Ambulatory Visit: Payer: Self-pay

## 2021-07-29 ENCOUNTER — Encounter: Payer: Self-pay | Admitting: Physician Assistant

## 2021-07-29 VITALS — BP 162/77 | HR 59 | Ht 69.0 in | Wt 188.0 lb

## 2021-07-29 DIAGNOSIS — M353 Polymyalgia rheumatica: Secondary | ICD-10-CM | POA: Diagnosis not present

## 2021-07-29 DIAGNOSIS — R5383 Other fatigue: Secondary | ICD-10-CM

## 2021-07-29 DIAGNOSIS — Z8639 Personal history of other endocrine, nutritional and metabolic disease: Secondary | ICD-10-CM

## 2021-07-29 DIAGNOSIS — M8589 Other specified disorders of bone density and structure, multiple sites: Secondary | ICD-10-CM | POA: Diagnosis not present

## 2021-07-29 DIAGNOSIS — M7061 Trochanteric bursitis, right hip: Secondary | ICD-10-CM | POA: Diagnosis not present

## 2021-07-29 DIAGNOSIS — M3501 Sicca syndrome with keratoconjunctivitis: Secondary | ICD-10-CM

## 2021-07-29 DIAGNOSIS — M5030 Other cervical disc degeneration, unspecified cervical region: Secondary | ICD-10-CM

## 2021-07-29 DIAGNOSIS — M170 Bilateral primary osteoarthritis of knee: Secondary | ICD-10-CM | POA: Diagnosis not present

## 2021-07-29 DIAGNOSIS — Z8679 Personal history of other diseases of the circulatory system: Secondary | ICD-10-CM | POA: Diagnosis not present

## 2021-07-29 DIAGNOSIS — M7062 Trochanteric bursitis, left hip: Secondary | ICD-10-CM

## 2021-07-29 DIAGNOSIS — Z79899 Other long term (current) drug therapy: Secondary | ICD-10-CM | POA: Diagnosis not present

## 2021-07-29 DIAGNOSIS — Z9011 Acquired absence of right breast and nipple: Secondary | ICD-10-CM

## 2021-07-29 DIAGNOSIS — Z8719 Personal history of other diseases of the digestive system: Secondary | ICD-10-CM

## 2021-07-29 DIAGNOSIS — Z8669 Personal history of other diseases of the nervous system and sense organs: Secondary | ICD-10-CM

## 2021-07-29 DIAGNOSIS — Z853 Personal history of malignant neoplasm of breast: Secondary | ICD-10-CM

## 2021-07-29 DIAGNOSIS — R202 Paresthesia of skin: Secondary | ICD-10-CM | POA: Diagnosis not present

## 2021-07-29 MED ORDER — HYDROXYCHLOROQUINE SULFATE 200 MG PO TABS
ORAL_TABLET | ORAL | 0 refills | Status: DC
Start: 2021-07-29 — End: 2021-08-26

## 2021-07-29 NOTE — Progress Notes (Unsigned)
Please review no print prescription that reflects dose change you advised today at patient's appointment. Thanks!

## 2021-07-30 NOTE — Progress Notes (Signed)
WBC count is low-3.2. absolute lymphocytes are borderline low.   Creatinine is borderline elevated-1.02 and GFR is slightly low-57.  Rest of CMP WNL. Please advise the patient to avoid the use of NSAIDs.  RF negative.  ESR remains elevated but stable. Complements WNL.  UA 1+ leukocytes.  Negative for nitrites or bacteria.  Negative for proteinuria.

## 2021-07-31 NOTE — Progress Notes (Signed)
Ro antibody positive.  La antibody negative.

## 2021-08-01 ENCOUNTER — Encounter: Payer: Self-pay | Admitting: Family Medicine

## 2021-08-01 NOTE — Telephone Encounter (Signed)
Called pt and scheduled a visit on 7/18

## 2021-08-04 LAB — URINALYSIS, ROUTINE W REFLEX MICROSCOPIC
Bacteria, UA: NONE SEEN /HPF
Bilirubin Urine: NEGATIVE
Glucose, UA: NEGATIVE
Hgb urine dipstick: NEGATIVE
Ketones, ur: NEGATIVE
Nitrite: NEGATIVE
Protein, ur: NEGATIVE
RBC / HPF: NONE SEEN /HPF (ref 0–2)
Specific Gravity, Urine: 1.016 (ref 1.001–1.035)
pH: 6.5 (ref 5.0–8.0)

## 2021-08-04 LAB — PROTEIN ELECTROPHORESIS, SERUM, WITH REFLEX
Albumin ELP: 3.8 g/dL (ref 3.8–4.8)
Alpha 1: 0.3 g/dL (ref 0.2–0.3)
Alpha 2: 0.8 g/dL (ref 0.5–0.9)
Beta 2: 0.8 g/dL — ABNORMAL HIGH (ref 0.2–0.5)
Beta Globulin: 0.6 g/dL (ref 0.4–0.6)
Gamma Globulin: 1.2 g/dL (ref 0.8–1.7)
Total Protein: 7.6 g/dL (ref 6.1–8.1)

## 2021-08-04 LAB — CBC WITH DIFFERENTIAL/PLATELET
Absolute Monocytes: 211 cells/uL (ref 200–950)
Basophils Absolute: 19 cells/uL (ref 0–200)
Basophils Relative: 0.6 %
Eosinophils Absolute: 19 cells/uL (ref 15–500)
Eosinophils Relative: 0.6 %
HCT: 38.3 % (ref 35.0–45.0)
Hemoglobin: 12.5 g/dL (ref 11.7–15.5)
Lymphs Abs: 806 cells/uL — ABNORMAL LOW (ref 850–3900)
MCH: 30.5 pg (ref 27.0–33.0)
MCHC: 32.6 g/dL (ref 32.0–36.0)
MCV: 93.4 fL (ref 80.0–100.0)
MPV: 11.3 fL (ref 7.5–12.5)
Monocytes Relative: 6.6 %
Neutro Abs: 2144 cells/uL (ref 1500–7800)
Neutrophils Relative %: 67 %
Platelets: 212 10*3/uL (ref 140–400)
RBC: 4.1 10*6/uL (ref 3.80–5.10)
RDW: 13 % (ref 11.0–15.0)
Total Lymphocyte: 25.2 %
WBC: 3.2 10*3/uL — ABNORMAL LOW (ref 3.8–10.8)

## 2021-08-04 LAB — COMPLETE METABOLIC PANEL WITH GFR
AG Ratio: 1.3 (calc) (ref 1.0–2.5)
ALT: 14 U/L (ref 6–29)
AST: 18 U/L (ref 10–35)
Albumin: 4.1 g/dL (ref 3.6–5.1)
Alkaline phosphatase (APISO): 64 U/L (ref 37–153)
BUN/Creatinine Ratio: 14 (calc) (ref 6–22)
BUN: 14 mg/dL (ref 7–25)
CO2: 27 mmol/L (ref 20–32)
Calcium: 9.4 mg/dL (ref 8.6–10.4)
Chloride: 105 mmol/L (ref 98–110)
Creat: 1.02 mg/dL — ABNORMAL HIGH (ref 0.60–1.00)
Globulin: 3.2 g/dL (calc) (ref 1.9–3.7)
Glucose, Bld: 74 mg/dL (ref 65–99)
Potassium: 4.4 mmol/L (ref 3.5–5.3)
Sodium: 141 mmol/L (ref 135–146)
Total Bilirubin: 0.5 mg/dL (ref 0.2–1.2)
Total Protein: 7.3 g/dL (ref 6.1–8.1)
eGFR: 57 mL/min/{1.73_m2} — ABNORMAL LOW (ref >=60)

## 2021-08-04 LAB — SJOGRENS SYNDROME-B EXTRACTABLE NUCLEAR ANTIBODY: SSB (La) (ENA) Antibody, IgG: <1 AI

## 2021-08-04 LAB — IFE INTERPRETATION: Immunofix Electr Int: NOT DETECTED

## 2021-08-04 LAB — RHEUMATOID FACTOR: Rheumatoid fact SerPl-aCnc: <14 IU/mL (ref <14)

## 2021-08-04 LAB — C3 AND C4
C3 Complement: 138 mg/dL (ref 83–193)
C4 Complement: 45 mg/dL (ref 15–57)

## 2021-08-04 LAB — ANTI-NUCLEAR AB-TITER (ANA TITER): ANA Titer 1: 1:320 {titer} — ABNORMAL HIGH

## 2021-08-04 LAB — SJOGRENS SYNDROME-A EXTRACTABLE NUCLEAR ANTIBODY: SSA (Ro) (ENA) Antibody, IgG: 6.5 AI — AB

## 2021-08-04 LAB — ANA: Anti Nuclear Antibody (ANA): POSITIVE — AB

## 2021-08-04 LAB — SEDIMENTATION RATE: Sed Rate: 36 mm/h — ABNORMAL HIGH (ref 0–30)

## 2021-08-04 NOTE — Progress Notes (Signed)
IFE did not reveal any monoclonal proteins.

## 2021-08-04 NOTE — Progress Notes (Signed)
ANA remains positive but is a lower titer.

## 2021-08-04 NOTE — Progress Notes (Unsigned)
I, Angela Simmons, LAT, ATC, am serving as scribe for Angela Simmons.  Angela Simmons is a 76 y.o. female who presents to [place] at [facility] today for f/u of R knee pain due ot DJD.  She was last seen by Angela Simmons on 04/10/21 for f/u of R knee pain and for R post leg pain likely due to lumbar radiculopathy.  She had a prior R knee steroid injection on 02/27/21 and was prescribed gabapentin.  Today, pt reports that her R knee pain returned about one week ago w/ no new MOI.  She locates her pain to her R ant knee and feels like she has some slight R knee swelling.  She has put ice on her knee and has tried Tylenol.  Diagnostic testing: R knee and L-spine XR- 02/27/21  Pertinent review of systems: No fevers or chills  Relevant historical information: Hypertension.  Osteopenia.  Diabetes.  PMR.   Exam:  BP (!) 160/90 (BP Location: Right Arm, Patient Position: Sitting, Cuff Size: Normal)   Pulse 68   Ht '5\' 9"'$  (1.753 m)   Wt 188 lb 9.6 oz (85.5 kg)   SpO2 96%   BMI 27.85 kg/m  General: Well Developed, well nourished, and in no acute distress.   MSK: Right knee: Mild effusion normal-appearing otherwise normal motion with crepitation.  Tender palpation medial joint line.  Intact strength.    Lab and Radiology Results  Procedure: Real-time Ultrasound Guided Injection of the right knee superior lateral patellar space Device: Philips Affiniti 50G Images permanently stored and available for review in PACS Verbal informed consent obtained.  Discussed risks and benefits of procedure. Warned about infection, bleeding, hyperglycemia damage to structures among others. Patient expresses understanding and agreement Time-out conducted.   Noted no overlying erythema, induration, or other signs of local infection.   Skin prepped in a sterile fashion.   Local anesthesia: Topical Ethyl chloride.   With sterile technique and under real time ultrasound guidance: 40 mg of Kenalog and  2 mL of Marcaine injected into joint capsule. Fluid seen entering the knee joint.   Completed without difficulty   Pain immediately resolved suggesting accurate placement of the medication.   Advised to call if fevers/chills, erythema, induration, drainage, or persistent bleeding.   Images permanently stored and available for review in the ultrasound unit.  Impression: Technically successful ultrasound guided injection.   EXAM: RIGHT KNEE 3 VIEWS   COMPARISON:  Right knee radiographs 04/23/2020   FINDINGS: Mild-to-moderate lateral compartment joint space narrowing with mild peripheral degenerative osteophytosis. Moderate patellofemoral joint space narrowing and peripheral osteophytosis. No joint effusion. No acute fracture or dislocation.   IMPRESSION: Mild-to-moderate lateral compartment and moderate patellofemoral compartment osteoarthritis, similar to prior.     Electronically Signed   By: Angela Simmons M.D.   On: 02/28/2021 17:21   I, Lynne Simmons, personally (independently) visualized and performed the interpretation of the images attached in this note.      Assessment and Plan: 76 y.o. female with right knee pain due to exacerbation of DJD.  Last injection was about 5 months ago.  Plan for repeat steroid injection today.  Recheck back as needed.  Ultimately may need a knee replacement but she is doing pretty well with conservative management for now.   PDMP not reviewed this encounter. Orders Placed This Encounter  Procedures   [place] LIMITED JOINT SPACE STRUCTURES LOW RIGHT(NO LINKED CHARGES)    Order Specific Question:   Reason for  Exam (SYMPTOM  OR DIAGNOSIS REQUIRED)    Answer:   R knee pain    Order Specific Question:   Preferred imaging location?    Answer:   [place]   No orders of the defined types were placed in this encounter.    Discussed warning signs or symptoms. Please see discharge instructions. Patient expresses  understanding.   The above documentation has been reviewed and is accurate and complete Lynne Simmons, M.D.

## 2021-08-05 ENCOUNTER — Ambulatory Visit (INDEPENDENT_AMBULATORY_CARE_PROVIDER_SITE_OTHER): Payer: Medicare Other | Admitting: Family Medicine

## 2021-08-05 ENCOUNTER — Encounter: Payer: Self-pay | Admitting: Family Medicine

## 2021-08-05 ENCOUNTER — Ambulatory Visit: Payer: Self-pay

## 2021-08-05 VITALS — BP 160/90 | HR 68 | Ht 69.0 in | Wt 188.6 lb

## 2021-08-05 DIAGNOSIS — M25561 Pain in right knee: Secondary | ICD-10-CM

## 2021-08-05 DIAGNOSIS — G8929 Other chronic pain: Secondary | ICD-10-CM

## 2021-08-05 NOTE — Patient Instructions (Signed)
Good to see you today.  You had a R knee injection.  Call or go to the ER if you develop a large red swollen joint with extreme pain or oozing puss.   Follow-up: as needed

## 2021-08-11 ENCOUNTER — Other Ambulatory Visit: Payer: Self-pay | Admitting: Internal Medicine

## 2021-08-21 DIAGNOSIS — E1142 Type 2 diabetes mellitus with diabetic polyneuropathy: Secondary | ICD-10-CM | POA: Diagnosis not present

## 2021-08-21 DIAGNOSIS — M79676 Pain in unspecified toe(s): Secondary | ICD-10-CM | POA: Diagnosis not present

## 2021-08-21 DIAGNOSIS — L84 Corns and callosities: Secondary | ICD-10-CM | POA: Diagnosis not present

## 2021-08-21 DIAGNOSIS — B351 Tinea unguium: Secondary | ICD-10-CM | POA: Diagnosis not present

## 2021-08-25 ENCOUNTER — Other Ambulatory Visit: Payer: Self-pay | Admitting: Physician Assistant

## 2021-08-26 ENCOUNTER — Other Ambulatory Visit: Payer: Self-pay | Admitting: Physician Assistant

## 2021-08-26 NOTE — Telephone Encounter (Signed)
Next Visit: 01/21/2022  Last Visit: 07/29/2021  Labs: 07/29/2021 WBC count is low-3.2. absolute lymphocytes are borderline low.   Creatinine is borderline elevated-1.02 and GFR is slightly low-57.  Rest of CMP WNL.  Eye exam: 11/19/2020 WNL    Current Dose per office note 07/29/2021: Plaquenil 200 mg 1 tablet by mouth daily  KD:[id number] syndrome with keratoconjunctivitis sicca   Okay to refill Plaquenil?

## 2021-09-04 ENCOUNTER — Encounter: Payer: Self-pay | Admitting: Cardiology

## 2021-09-04 ENCOUNTER — Ambulatory Visit (INDEPENDENT_AMBULATORY_CARE_PROVIDER_SITE_OTHER): Payer: Medicare Other | Admitting: Cardiology

## 2021-09-04 VITALS — BP 138/82 | HR 58 | Ht 69.0 in | Wt 188.2 lb

## 2021-09-04 DIAGNOSIS — I4892 Unspecified atrial flutter: Secondary | ICD-10-CM

## 2021-09-04 DIAGNOSIS — E782 Mixed hyperlipidemia: Secondary | ICD-10-CM

## 2021-09-04 DIAGNOSIS — I10 Essential (primary) hypertension: Secondary | ICD-10-CM

## 2021-09-04 NOTE — Progress Notes (Signed)
Clinical Summary [name] is a 76 y.o.female seen today for follow up of the following medical problems.      1. Aflutter/ Atach - noted during 07/2019 admission. Started on lopressor and eliquis at that time.  - - seen in ER 08/23/19 with palpitations, SOB. EKG at the stime shows NSR   -higher doses of dilt caused dizziness, fatigue.    06/2020 monitor showed infrequent short runs of SVT.  -no palpitations - compliant with meds. No bleeding on eliquis       2. SOB 03/2021 echo: LVEF 55-60%, no WMAs, indet diastolic. Normal RV, PASP 44 No recent symptoms.    3. History of chest pain - cardiac catheterization in 11/2019 showed a normal left system as outlined above but the RCA was unable to be engaged. Medical therapy was recommended unless she had refractory symptoms - very infrequent symptoms   - denies any recent symptoms.      4. HTN - compliant with meds - home bp 128/84 today   5. Hyperlipdiemia - she is on atorvastatin 72m   06/2021 TC 126 TG 39 HDL 52 LDL 66     Past Medical History:  Diagnosis Date   [place]   [place] breast   [place]    Diabetes mellitus without complication (HCC)    GERD (gastroesophageal reflux disease)    HTN (hypertension)    Hypercholesterolemia    Obesity    Osteoarthritis      Allergies  Allergen Reactions   Levofloxacin Hives and Itching   Nitrofurantoin Hives and Itching   Penicillins Hives and Itching   Sulfonamide Derivatives Itching   Pantoprazole Other (See Comments)    Bad dreams     Current Outpatient Medications  Medication Sig Dispense Refill   apixaban (ELIQUIS) 5 MG TABS tablet Take 1 tablet by mouth twice daily 60 tablet 5   atorvastatin (LIPITOR) 10 MG tablet TAKE 1 TABLET BY MOUTH DAILY 90 tablet 1   benazepril (LOTENSIN) 40 MG tablet Take 1 tablet by mouth once daily 90 tablet 0   Blood Glucose Monitoring Suppl (ONETOUCH VERIO) w/Device KIT USE TO TEST  BLOOD SUGAR ONCE DAILY AS DIRECTED 1 kit 0   diclofenac sodium (VOLTAREN) 1 % GEL Apply 3 g to 3 large joints up to 3 times daily. 3 Tube 3   DILT-XR 120 MG 24 hr capsule TAKE 1 CAPSULE BY MOUTH EVERY DAY 90 capsule 3   fluticasone (FLONASE) 50 MCG/ACT nasal spray Place 1 spray into both nostrils as needed for allergies or rhinitis.     glucose blood test strip USE TO TEST BLOOD SUGAR ONCE DAILY. E11.40 100 each 3   hydroxychloroquine (PLAQUENIL) 200 MG tablet Take 1 tablet (200 mg total) by mouth daily. 90 tablet 0   loratadine (CLARITIN) 10 MG tablet TAKE 1 TABLET BY MOUTH EVERY DAY 30 tablet 2   metoprolol tartrate (LOPRESSOR) 25 MG tablet Take 1 tablet (25 mg total) by mouth 2 (two) times daily. 60 tablet 6   Multiple Vitamins-Minerals (CENTRUM SILVER 50+WOMEN) TABS Take 1 tablet by mouth daily at 6 (six) AM.     nitroGLYCERIN (NITROSTAT) 0.4 MG SL tablet Place 1 tablet (0.4 mg total) under the tongue every 5 (five) minutes as needed for chest pain. (Patient not taking: Reported on 07/29/2021) 25 tablet 3   Omega-3 Fatty Acids (FISH OIL) 1000 MG CAPS Take 1,000 mg by mouth daily.     omeprazole (  PRILOSEC) 40 MG capsule TAKE ONE CAPSULE BY MOUTH DAILY 90 capsule 3   ONETOUCH DELICA LANCETS 37T MISC USE TO TEST ONCE DAILY 100 each 2   ONETOUCH VERIO test strip USE TO TEST BLOOD SUGAR ONCE DAILY 100 each 0   Turmeric (QC TUMERIC COMPLEX) 500 MG CAPS Take 1 capsule by mouth daily.     No current facility-administered medications for this visit.     Past Surgical History:  Procedure Laterality Date   ABDOMINAL HYSTERECTOMY  1993   [place], fibro[place]   AORTIC ARCH ANGIOGRAPHY N/A 12/07/2019   Procedure: AORTIC ARCH ANGIOGRAPHY;  Surgeon: Lorretta Harp, MD;  Location: [facility];  Service: Cardiovascular;  Laterality: N/A;   COLONOSCOPY  May 2003   Dr. Tamala Julian: normal   COLONOSCOPY  06/22/2011   Procedure: COLONOSCOPY;  Surgeon: Danie Binder, MD;  Location[facility];  Service:  Endoscopy;  Laterality: N/A;  10:30   LEFT HEART CATH AND CORONARY ANGIOGRAPHY N/A 12/07/2019   Procedure: LEFT HEART CATH AND CORONARY ANGIOGRAPHY;  Surgeon: Lorretta Harp, MD;  Location: [facility];  Service: Cardiovascular;  Laterality: N/A;   MASTECTOMY  1995   right      Allergies  Allergen Reactions   Levofloxacin Hives and Itching   Nitrofurantoin Hives and Itching   Penicillins Hives and Itching   Sulfonamide Derivatives Itching   Pantoprazole Other (See Comments)    Bad dreams      Family History  Problem Relation Age of Onset   Diabetes Mother        diabetic coma   Heart failure Mother    Diabetes Father    Emphysema Father    Diabetes Sister    Hypercholesterolemia Sister    Hypercholesterolemia Brother    Diabetes Brother    Hypertension Brother    Hypercholesterolemia Brother    Diabetes Sister    Hypertension Sister    Hypercholesterolemia Sister    Hypercholesterolemia Sister    Hypercholesterolemia Sister    Diabetes Brother    Hypertension Brother    Colon cancer Neg Hx      Social History [name] reports that she has never smoked. She has never been exposed to tobacco smoke. She has never used smokeless tobacco. [name] reports no history of alcohol use.   Review of Systems CONSTITUTIONAL: No weight loss, fever, chills, weakness or fatigue.  HEENT: Eyes: No visual loss, blurred vision, double vision or yellow sclerae.No hearing loss, sneezing, congestion, runny nose or sore throat.  SKIN: No rash or itching.  CARDIOVASCULAR: per hpi RESPIRATORY: No shortness of breath, cough or sputum.  GASTROINTESTINAL: No anorexia, nausea, vomiting or diarrhea. No abdominal pain or blood.  GENITOURINARY: No burning on urination, no polyuria NEUROLOGICAL: No headache, dizziness, syncope, paralysis, ataxia, numbness or tingling in the extremities. No change in bowel or bladder control.  MUSCULOSKELETAL: No muscle, back pain, joint pain  or stiffness.  LYMPHATICS: No enlarged nodes. No history of splenectomy.  PSYCHIATRIC: No history of depression or anxiety.  ENDOCRINOLOGIC: No reports of sweating, cold or heat intolerance. No polyuria or polydipsia.  Marland Kitchen   Physical Examination Today's Vitals   09/04/21 0952  BP: 138/82  Pulse: (!) 58  SpO2: 98%  Weight: 188 lb 3.2 oz (85.4 kg)  Height: 5' 9"  (1.753 m)   Body mass index is 27.79 kg/m.  Gen: resting comfortably, no acute distress HEENT: no scleral icterus, pupils equal round and reactive, no palptable cervical  adenopathy,  CV: RRR, no m/r/g no jvd Resp: Clear to auscultation bilaterally GI: abdomen is soft, non-tender, non-distended, normal bowel sounds, no hepatosplenomegaly MSK: extremities are warm, no edema.  Skin: warm, no rash Neuro:  no focal deficits Psych: appropriate affect   Diagnostic Studies  05/2019 nuclear stress No diagnostic ST segment changes to indicate ischemia. No significant myocardial perfusion defects to indicate scar or ischemia. There is apical breast attenuation artifact This is a low risk study. Nuclear stress EF: 72%.   04/2019 echo IMPRESSIONS     1. Left ventricular ejection fraction, by estimation, is 60 to 65%. The  left ventricle has normal function. The left ventricle has no regional  wall motion abnormalities. There is mild left ventricular hypertrophy.  Left ventricular diastolic parameters  were normal.   2. Right ventricular systolic function is normal. The right ventricular  size is normal. There is mildly elevated pulmonary artery systolic  pressure.   3. The mitral valve is normal in structure. Trivial mitral valve  regurgitation. No evidence of mitral stenosis.   4. The aortic valve is tricuspid. Aortic valve regurgitation is not  visualized. No aortic stenosis is present.   5. The inferior vena cava is normal in size with greater than 50%  respiratory variability, suggesting right atrial pressure of 3  mmHg.   11/2019 cath IMPRESSION: Ms. Torrico has normal left coronary system.  She has an apparent takeoff RCA from close to the left main takeoff.  I was unable to selectively intubate this from the right radial approach.  I did use 180 cc of contrast and I aborted the procedure.  She will need to return for RCA angiography via the right femoral approach.  The sheath was removed and a TR band was placed on the right wrist to achieve patent hemostasis.  The patient left lab in stable condition.  She will be discharged home today as an outpatient.   06/2020 event monitor Rare supraventricular ectopy. Rare short runs of SVT up to 12 beats. Rare ventricular ectopy Reported episode of "fainting" correlated with sinus rhythm at 60 bpm     Patch Wear Time:  13 days and 16 hours (2022-05-18T11:46:34-0400 to 2022-06-01T04:15:38-0400)   Patient had a min HR of 41 bpm, max HR of 174 bpm, and avg HR of 55 bpm. Predominant underlying rhythm was Sinus Rhythm. 27 Supraventricular Tachycardia runs occurred, the run with the fastest interval lasting 5 beats with a max rate of 174 bpm, the longest lasting 12 beats with an avg rate of 113 bpm. Isolated SVEs were rare (<1.0%), SVE Couplets were rare (<1.0%), and SVE Triplets were rare (<1.0%). Isolated VEs were rare (<1.0%), VE Couplets were rare (<1.0%), and no VE Triplets were present. Ventricular Bigeminy was present.   03/2021 echo IMPRESSIONS     1. Left ventricular ejection fraction, by estimation, is 55 to 60%. The  left ventricle has normal function. The left ventricle has no regional  wall motion abnormalities. There is mild asymmetric left ventricular  hypertrophy of the basal segment. Left  ventricular diastolic parameters are indeterminate.   2. Right ventricular systolic function is normal. The right ventricular  size is normal. There is mildly elevated pulmonary artery systolic  pressure. The estimated right ventricular systolic pressure is  49.7 mmHg.   3. The mitral valve is grossly normal. Mild mitral valve regurgitation.   4. The aortic valve is tricuspid. Aortic valve regurgitation is mild.  Aortic valve sclerosis is present, with no evidence of  aortic valve  stenosis.   5. The inferior vena cava is normal in size with greater than 50%  respiratory variability, suggesting right atrial pressure of 3 mmHg.   Comparison(s): Prior images reviewed side by side. LVEF remains normal  range at 55-60%. Estimated RVSP mildly elevated.   Assessment and Plan   1. Aflutter/atach/acquired thrombophilia -no recent symptoms, continue current meds including eliquis for stroke prevention   2. HTN - mildly elevated here, has not taken all her meds, Recent home bp at goal - monitor home bp's over the weekend, update [place] Monday   3. Hyperipidemia - she is at goal, continue current meds        Arnoldo Lenis, M.D.

## 2021-09-04 NOTE — Patient Instructions (Signed)
Medication Instructions:  Your physician recommends that you continue on your current medications as directed. Please refer to the Current Medication list given to you today.   Labwork: none  Testing/Procedures: none  Follow-Up:  Your physician recommends that you schedule a follow-up appointment in: 6 months  Any Other Special Instructions Will Be Listed Below (If Applicable).  Please call the office or send a Mychart message on Monday with a log of your blood pressure readings.  If you need a refill on your cardiac medications before your next appointment, please call your pharmacy.

## 2021-09-08 ENCOUNTER — Encounter: Payer: Self-pay | Admitting: Cardiology

## 2021-09-10 LAB — HM DIABETES EYE EXAM

## 2021-09-17 NOTE — Telephone Encounter (Signed)
Overall looks good, isolated high number but others look fine and would not make any med changes  Zandra Abts MD

## 2021-09-18 ENCOUNTER — Encounter: Payer: Self-pay | Admitting: Internal Medicine

## 2021-09-18 NOTE — Progress Notes (Signed)
Outside notes received. Information abstracted. Notes sent to scan.  

## 2021-10-01 ENCOUNTER — Other Ambulatory Visit: Payer: Self-pay | Admitting: Internal Medicine

## 2021-10-06 DIAGNOSIS — H04123 Dry eye syndrome of bilateral lacrimal glands: Secondary | ICD-10-CM | POA: Diagnosis not present

## 2021-10-30 ENCOUNTER — Other Ambulatory Visit: Payer: Self-pay | Admitting: Internal Medicine

## 2021-10-30 DIAGNOSIS — E1142 Type 2 diabetes mellitus with diabetic polyneuropathy: Secondary | ICD-10-CM | POA: Diagnosis not present

## 2021-10-30 DIAGNOSIS — L84 Corns and callosities: Secondary | ICD-10-CM | POA: Diagnosis not present

## 2021-10-30 DIAGNOSIS — M79676 Pain in unspecified toe(s): Secondary | ICD-10-CM | POA: Diagnosis not present

## 2021-10-30 DIAGNOSIS — B351 Tinea unguium: Secondary | ICD-10-CM | POA: Diagnosis not present

## 2021-11-03 ENCOUNTER — Telehealth: Payer: Self-pay | Admitting: Internal Medicine

## 2021-11-03 NOTE — Telephone Encounter (Signed)
LVM for pt to rtn my call to schedule AWV with [facility] Mickel Baas call back # [phone]

## 2021-11-12 ENCOUNTER — Encounter: Payer: Self-pay | Admitting: Internal Medicine

## 2021-11-12 NOTE — Progress Notes (Unsigned)
    Subjective:    Patient ID: Angela Simmons, female    DOB: [birth year], 76 y.o.   MRN: [id number]      HPI Angela Simmons is here for No chief complaint on file.    Angela Simmons from across my back to breast -   S/p R mastectomy    Medications and allergies reviewed with patient and updated if appropriate.  Current Outpatient Medications on File Prior to Visit  Medication Sig Dispense Refill   apixaban (ELIQUIS) 5 MG TABS tablet Take 1 tablet by mouth twice daily 60 tablet 5   atorvastatin (LIPITOR) 10 MG tablet TAKE 1 TABLET BY MOUTH DAILY 90 tablet 1   benazepril (LOTENSIN) 40 MG tablet Take 1 tablet by mouth once daily 90 tablet 0   Blood Glucose Monitoring Suppl (ONETOUCH VERIO) w/Device KIT USE TO TEST BLOOD SUGAR ONCE DAILY AS DIRECTED 1 kit 0   diclofenac sodium (VOLTAREN) 1 % GEL Apply 3 g to 3 large joints up to 3 times daily. 3 Tube 3   DILT-XR 120 MG 24 hr capsule TAKE 1 CAPSULE BY MOUTH EVERY DAY 90 capsule 3   fluticasone (FLONASE) 50 MCG/ACT nasal spray Place 1 spray into both nostrils as needed for allergies or rhinitis.     glucose blood test strip USE TO TEST BLOOD SUGAR ONCE DAILY. E11.40 100 each 3   hydroxychloroquine (PLAQUENIL) 200 MG tablet Take 1 tablet (200 mg total) by mouth daily. 90 tablet 0   loratadine (CLARITIN) 10 MG tablet TAKE 1 TABLET BY MOUTH EVERY DAY 30 tablet 2   metoprolol tartrate (LOPRESSOR) 25 MG tablet Take 1 tablet (25 mg total) by mouth 2 (two) times daily. 60 tablet 6   Multiple Vitamins-Minerals (CENTRUM SILVER 50+WOMEN) TABS Take 1 tablet by mouth daily at 6 (six) AM.     nitroGLYCERIN (NITROSTAT) 0.4 MG SL tablet Place 1 tablet (0.4 mg total) under the tongue every 5 (five) minutes as needed for chest pain. (Patient not taking: Reported on 07/29/2021) 25 tablet 3   Omega-3 Fatty Acids (FISH OIL) 1000 MG CAPS Take 1,000 mg by mouth daily.     omeprazole (PRILOSEC) 40 MG capsule TAKE 1 CAPSULE BY MOUTH DAILY 90 capsule 0   ONETOUCH DELICA  LANCETS 09G MISC USE TO TEST ONCE DAILY 100 each 2   ONETOUCH VERIO test strip USE TO TEST BLOOD SUGAR ONCE DAILY 100 each 0   Turmeric (QC TUMERIC COMPLEX) 500 MG CAPS Take 1 capsule by mouth daily. (Patient not taking: Reported on 09/04/2021)     No current facility-administered medications on file prior to visit.    Review of Systems     Objective:  There were no vitals filed for this visit. BP Readings from Last 3 Encounters:  09/04/21 138/82  08/05/21 (!) 160/90  07/29/21 (!) 162/77   Wt Readings from Last 3 Encounters:  09/04/21 188 lb 3.2 oz (85.4 kg)  08/05/21 188 lb 9.6 oz (85.5 kg)  07/29/21 188 lb (85.3 kg)   There is no height or weight on file to calculate BMI.    Physical Exam         Assessment & Plan:    See Problem List for Assessment and Plan of chronic medical problems.

## 2021-11-13 ENCOUNTER — Ambulatory Visit (INDEPENDENT_AMBULATORY_CARE_PROVIDER_SITE_OTHER): Payer: Medicare Other

## 2021-11-13 ENCOUNTER — Ambulatory Visit (INDEPENDENT_AMBULATORY_CARE_PROVIDER_SITE_OTHER): Payer: Medicare Other | Admitting: Internal Medicine

## 2021-11-13 VITALS — BP 140/70 | HR 54 | Temp 98.2°F | Ht 69.0 in | Wt 185.0 lb

## 2021-11-13 DIAGNOSIS — E1140 Type 2 diabetes mellitus with diabetic neuropathy, unspecified: Secondary | ICD-10-CM | POA: Diagnosis not present

## 2021-11-13 DIAGNOSIS — M549 Dorsalgia, unspecified: Secondary | ICD-10-CM

## 2021-11-13 DIAGNOSIS — I10 Essential (primary) hypertension: Secondary | ICD-10-CM

## 2021-11-13 DIAGNOSIS — M546 Pain in thoracic spine: Secondary | ICD-10-CM | POA: Diagnosis not present

## 2021-11-13 LAB — POCT GLUCOSE (DEVICE FOR HOME USE)
Glucose Fasting, POC: 82 mg/dL (ref 70–99)
POC Glucose: 82 mg/dl (ref 70–99)

## 2021-11-13 NOTE — Patient Instructions (Addendum)
     Your pain is  likely musculoskeletal in nature.  Since it has improved lets just monitor it for now.       Have an xray done downstairs.      Medications changes include :   none     Return if symptoms worsen or fail to improve.

## 2021-11-13 NOTE — Assessment & Plan Note (Signed)
Chronic Sugars well controlled in the prediabetic range Controlled with diet Had some normal sugars but has felt like they were lower-eating has helped Glucose here today normal Advised to eat more which may help stabilize sugar more, blood sugars technically in the normal range so no concerns

## 2021-11-13 NOTE — Assessment & Plan Note (Signed)
Neck Blood pressure adequately controlled Continue benazepril 40 mg daily, diltiazem 120 mg daily, metoprolol 25 mg twice daily

## 2021-11-13 NOTE — Assessment & Plan Note (Signed)
Acute Started about 1 week ago without obvious injury Pain has improved Pain is worse with movement, better or goes away with rest Pain from thoracic spine to right chest Likely musculoskeletal in nature Given tenderness in thoracic spine we will check thoracic spine x-ray Pain is improving and expected to continue to improve Can take Tylenol as needed She will let me know if pain does not continue to improve, resolve or worsens and then we will pursue further evaluation

## 2021-11-24 DIAGNOSIS — C50911 Malignant neoplasm of unspecified site of right female breast: Secondary | ICD-10-CM | POA: Diagnosis not present

## 2021-11-27 ENCOUNTER — Telehealth: Payer: Medicare Other

## 2021-12-21 ENCOUNTER — Other Ambulatory Visit: Payer: Self-pay | Admitting: Internal Medicine

## 2021-12-21 ENCOUNTER — Other Ambulatory Visit: Payer: Self-pay | Admitting: Physician Assistant

## 2021-12-22 NOTE — Telephone Encounter (Signed)
Next Visit: 01/21/2022  Last Visit: 07/29/2021  Labs: 07/29/2021  WBC count is low-3.2. absolute lymphocytes are borderline low.   Creatinine is borderline elevated-1.02 and GFR is slightly low-57.  Rest of CMP WNL. Please advise the patient to avoid the use of NSAIDs. RF negative.  ESR remains elevated but stable. Complements WNL. UA 1+ leukocytes.  Negative for nitrites or bacteria.  Negative for proteinuria.  Ro antibody positive.  La antibody negative. ANA remains positive but is a lower titer. IFE did not reveal any monoclonal proteins   Eye exam: 11/19/2020 I called patient, PLQ eye exam due   Current Dose per office note 07/29/2021: Plaquenil 200 mg 1 tablet by mouth daily   DX: Sjogren's syndrome with keratoconjunctivitis sicca   Last Fill: 08/26/2021  Okay to refill Plaquenil?

## 2021-12-23 ENCOUNTER — Telehealth: Payer: Self-pay | Admitting: Internal Medicine

## 2021-12-23 NOTE — Telephone Encounter (Signed)
Spoke with daughter today 

## 2021-12-23 NOTE — Telephone Encounter (Signed)
Patient's daughter called and stated that she would like Dominica Dr. Eilleen Kempf nurse to give her a call back as soon as possible. Patient did not state the issue. Best phone number is [phone].

## 2021-12-27 ENCOUNTER — Other Ambulatory Visit: Payer: Self-pay | Admitting: Cardiology

## 2021-12-29 NOTE — Telephone Encounter (Signed)
Prescription refill request for Eliquis received. Indication: A Flutter Last office visit: 09/04/21  Zandra Abts MD Scr: 1.02 on 07/29/21 Age: 76 Weight: 85.4kg  Based on above findings Eliquis '5mg'$  twice daily is the appropriate dose.  Refill approved.

## 2022-01-04 NOTE — Progress Notes (Unsigned)
Subjective:    Patient ID: Angela Simmons, female    DOB: [birth year], 76 y.o.   MRN: [id number]      HPI Angela Simmons is here for a Physical exam.    When driving - right foot feels heavy.  She has had her foot fall off the brake in the past and had difficulty once when she tried to pull her foot off the gas.  She denies any symptoms in the leg.  She does have some knee osteoarthritis.  Occ difficulty with going up steps-feels like she cannot get the foot up high enough.    Medications and allergies reviewed with patient and updated if appropriate.  Current Outpatient Medications on File Prior to Visit  Medication Sig Dispense Refill   apixaban (ELIQUIS) 5 MG TABS tablet Take 1 tablet by mouth twice daily 60 tablet 5   atorvastatin (LIPITOR) 10 MG tablet Take 1 tablet by mouth daily 180 tablet 0   benazepril (LOTENSIN) 40 MG tablet Take 1 tablet by mouth once daily 90 tablet 0   Blood Glucose Monitoring Suppl (ONETOUCH VERIO) w/Device KIT USE TO TEST BLOOD SUGAR ONCE DAILY AS DIRECTED 1 kit 0   clotrimazole-betamethasone (LOTRISONE) cream Apply topically.     diclofenac sodium (VOLTAREN) 1 % GEL Apply 3 g to 3 large joints up to 3 times daily. 3 Tube 3   DILT-XR 120 MG 24 hr capsule TAKE 1 CAPSULE BY MOUTH EVERY DAY 90 capsule 3   fluticasone (FLONASE) 50 MCG/ACT nasal spray Place 1 spray into both nostrils as needed for allergies or rhinitis.     glucose blood test strip USE TO TEST BLOOD SUGAR ONCE DAILY. E11.40 100 each 3   hydroxychloroquine (PLAQUENIL) 200 MG tablet Take 1 tablet by mouth once daily 90 tablet 0   loratadine (CLARITIN) 10 MG tablet TAKE 1 TABLET BY MOUTH EVERY DAY 30 tablet 2   metoprolol tartrate (LOPRESSOR) 25 MG tablet Take 1 tablet (25 mg total) by mouth 2 (two) times daily. 60 tablet 6   Multiple Vitamins-Minerals (CENTRUM SILVER 50+WOMEN) TABS Take 1 tablet by mouth daily at 6 (six) AM.     nitroGLYCERIN (NITROSTAT) 0.4 MG SL tablet Place 1 tablet (0.4 mg total)  under the tongue every 5 (five) minutes as needed for chest pain. 25 tablet 3   Omega-3 Fatty Acids (FISH OIL) 1000 MG CAPS Take 1,000 mg by mouth daily.     omeprazole (PRILOSEC) 40 MG capsule Take 1 capsule by mouth once daily 90 capsule 0   ONETOUCH DELICA LANCETS 40C MISC USE TO TEST ONCE DAILY 100 each 2   ONETOUCH VERIO test strip USE TO TEST BLOOD SUGAR ONCE DAILY 100 each 0   Turmeric (QC TUMERIC COMPLEX) 500 MG CAPS Take 1 capsule by mouth daily.     No current facility-administered medications on file prior to visit.    Review of Systems  Constitutional:  Negative for fever.  Eyes:  Negative for visual disturbance.  Respiratory:  Negative for cough, shortness of breath and wheezing.   Cardiovascular:  Positive for palpitations (occ). Negative for chest pain and leg swelling.  Gastrointestinal:  Negative for abdominal pain, blood in stool, constipation, diarrhea and nausea.       No gerd  Genitourinary:  Positive for dysuria and frequency. Negative for hematuria.  Musculoskeletal:  Negative for arthralgias and back pain.  Skin:  Negative for rash.       Dry skin  Neurological:  Negative for  light-headedness and headaches.  Psychiatric/Behavioral:  Negative for dysphoric mood. The patient is not nervous/anxious.        Objective:   Vitals:   01/05/22 1031  BP: (!) 142/80  Pulse: 62  Temp: 98 F (36.7 C)  SpO2: 98%   Filed Weights   01/05/22 1031  Weight: 184 lb 3.2 oz (83.6 kg)   Body mass index is 27.2 kg/m.  BP Readings from Last 3 Encounters:  01/05/22 (!) 142/80  11/13/21 (!) 140/70  09/04/21 138/82    Wt Readings from Last 3 Encounters:  01/05/22 184 lb 3.2 oz (83.6 kg)  11/13/21 185 lb (83.9 kg)  09/04/21 188 lb 3.2 oz (85.4 kg)       Physical Exam Constitutional: She appears well-developed and well-nourished. No distress.  HENT:  Head: Normocephalic and atraumatic.  Right Ear: External ear normal. Normal ear canal and TM Left Ear: External  ear normal.  Normal ear canal and TM Mouth/Throat: Oropharynx is clear and moist.  Eyes: Conjunctivae normal.  Neck: Neck supple. No tracheal deviation present. No thyromegaly present.  No carotid bruit  Cardiovascular: Normal rate, regular rhythm and normal heart sounds.   No murmur heard.  No edema. Pulmonary/Chest: Effort normal and breath sounds normal. No respiratory distress. She has no wheezes. She has no rales.  Breast: deferred   Abdominal: Soft. She exhibits no distension. There is no tenderness.  Lymphadenopathy: She has no cervical adenopathy.  Skin: Skin is warm and dry. She is not diaphoretic.  Psychiatric: She has a normal mood and affect. Her behavior is normal.     Lab Results  Component Value Date   WBC 3.2 (L) 07/29/2021   HGB 12.5 07/29/2021   HCT 38.3 07/29/2021   PLT 212 07/29/2021   GLUCOSE 74 07/29/2021   CHOL 126 07/04/2021   TRIG 39.0 07/04/2021   HDL 52.20 07/04/2021   LDLCALC 66 07/04/2021   ALT 14 07/29/2021   AST 18 07/29/2021   NA 141 07/29/2021   K 4.4 07/29/2021   CL 105 07/29/2021   CREATININE 1.02 (H) 07/29/2021   BUN 14 07/29/2021   CO2 27 07/29/2021   TSH 1.515 01/05/2020   HGBA1C 6.1 07/04/2021   MICROALBUR 1.1 07/04/2021         Assessment & Plan:   Physical exam: Screening blood work  ordered Exercise  none now that it is cold Weight  good for age Substance abuse  none   Reviewed recommended immunizations.   Health Maintenance  Topic Date Due   DTaP/Tdap/Td (2 - Tdap) 07/04/2013   Medicare Annual Wellness (AWV)  02/20/2017   COVID-19 Vaccine (3 - Moderna risk series) 05/11/2019   COLONOSCOPY (Pts 45-60yr Insurance coverage will need to be confirmed)  06/21/2021   INFLUENZA VACCINE  08/19/2021   HEMOGLOBIN A1C  01/03/2022   Diabetic kidney evaluation - Urine ACR  07/05/2022   FOOT EXAM  07/05/2022   Diabetic kidney evaluation - eGFR measurement  07/30/2022   DEXA SCAN  08/27/2022   OPHTHALMOLOGY EXAM   09/11/2022   Pneumonia Vaccine 75 Years old  Completed   Hepatitis C Screening  Completed   Zoster Vaccines- Shingrix  Completed   HPV VACCINES  Aged Out          See Problem List for Assessment and Plan of chronic medical problems.

## 2022-01-04 NOTE — Patient Instructions (Addendum)
Consider a tetanus vaccine at your pharmacy.    Blood work was ordered.   The lab is on the first floor.    Medications changes include :   none   A referral was ordered for neurology.    Return in about 6 months (around 07/07/2022) for follow up.    Health Maintenance, Female Adopting a healthy lifestyle and getting preventive care are important in promoting health and wellness. Ask your health care provider about: The right schedule for you to have regular tests and exams. Things you can do on your own to prevent diseases and keep yourself healthy. What should I know about diet, weight, and exercise? Eat a healthy diet  Eat a diet that includes plenty of vegetables, fruits, low-fat dairy products, and lean protein. Do not eat a lot of foods that are high in solid fats, added sugars, or sodium. Maintain a healthy weight Body mass index (BMI) is used to identify weight problems. It estimates body fat based on height and weight. Your health care provider can help determine your BMI and help you achieve or maintain a healthy weight. Get regular exercise Get regular exercise. This is one of the most important things you can do for your health. Most adults should: Exercise for at least 150 minutes each week. The exercise should increase your heart rate and make you sweat (moderate-intensity exercise). Do strengthening exercises at least twice a week. This is in addition to the moderate-intensity exercise. Spend less time sitting. Even light physical activity can be beneficial. Watch cholesterol and blood lipids Have your blood tested for lipids and cholesterol at 75 years of age, then have this test every 5 years. Have your cholesterol levels checked more often if: Your lipid or cholesterol levels are high. You are older than 75 years of age. You are at high risk for heart disease. What should I know about cancer screening? Depending on your health history and family  history, you may need to have cancer screening at various ages. This may include screening for: Breast cancer. Cervical cancer. Colorectal cancer. Skin cancer. Lung cancer. What should I know about heart disease, diabetes, and high blood pressure? Blood pressure and heart disease High blood pressure causes heart disease and increases the risk of stroke. This is more likely to develop in people who have high blood pressure readings or are overweight. Have your blood pressure checked: Every 3-5 years if you are 63-34 years of age. Every year if you are 75 years old or older. Diabetes Have regular diabetes screenings. This checks your fasting blood sugar level. Have the screening done: Once every three years after age 50 if you are at a normal weight and have a low risk for diabetes. More often and at a younger age if you are overweight or have a high risk for diabetes. What should I know about preventing infection? Hepatitis B If you have a higher risk for hepatitis B, you should be screened for this virus. Talk with your health care provider to find out if you are at risk for hepatitis B infection. Hepatitis C Testing is recommended for: Everyone born from [birth year] through 1964. Anyone with known risk factors for hepatitis C. Sexually transmitted infections (STIs) Get screened for STIs, including gonorrhea and chlamydia, if: You are sexually active and are younger than 75 years of age. You are older than 75 years of age and your health care provider tells you that you are at risk for this  type of infection. Your sexual activity has changed since you were last screened, and you are at increased risk for chlamydia or gonorrhea. Ask your health care provider if you are at risk. Ask your health care provider about whether you are at high risk for HIV. Your health care provider may recommend a prescription medicine to help prevent HIV infection. If you choose to take medicine to prevent HIV, you  should first get tested for HIV. You should then be tested every 3 months for as long as you are taking the medicine. Pregnancy If you are about to stop having your period (premenopausal) and you may become pregnant, seek counseling before you get pregnant. Take 400 to 800 micrograms (mcg) of folic acid every day if you become pregnant. Ask for birth control (contraception) if you want to prevent pregnancy. Osteoporosis and menopause Osteoporosis is a disease in which the bones lose minerals and strength with aging. This can result in bone fractures. If you are 44 years old or older, or if you are at risk for osteoporosis and fractures, ask your health care provider if you should: Be screened for bone loss. Take a calcium or vitamin D supplement to lower your risk of fractures. Be given hormone replacement therapy (HRT) to treat symptoms of menopause. Follow these instructions at home: Alcohol use Do not drink alcohol if: Your health care provider tells you not to drink. You are pregnant, may be pregnant, or are planning to become pregnant. If you drink alcohol: Limit how much you have to: 0-1 drink a day. Know how much alcohol is in your drink. In the [place] one drink equals one 12 oz bottle of beer (355 mL), one 5 oz glass of wine (148 mL), or one 1 oz glass of hard liquor (44 mL). Lifestyle Do not use any products that contain nicotine or tobacco. These products include cigarettes, chewing tobacco, and vaping devices, such as e-cigarettes. If you need help quitting, ask your health care provider. Do not use street drugs. Do not share needles. Ask your health care provider for help if you need support or information about quitting drugs. General instructions Schedule regular health, dental, and eye exams. Stay current with your vaccines. Tell your health care provider if: You often feel depressed. You have ever been abused or do not feel safe at home. Summary Adopting a healthy  lifestyle and getting preventive care are important in promoting health and wellness. Follow your health care provider's instructions about healthy diet, exercising, and getting tested or screened for diseases. Follow your health care provider's instructions on monitoring your cholesterol and blood pressure. This information is not intended to replace advice given to you by your health care provider. Make sure you discuss any questions you have with your health care provider. Document Revised: 05/27/2020 Document Reviewed: 05/27/2020 Elsevier Patient Education  [employer].

## 2022-01-05 ENCOUNTER — Other Ambulatory Visit: Payer: Self-pay

## 2022-01-05 ENCOUNTER — Ambulatory Visit (INDEPENDENT_AMBULATORY_CARE_PROVIDER_SITE_OTHER): Payer: Medicare Other | Admitting: Internal Medicine

## 2022-01-05 ENCOUNTER — Encounter: Payer: Self-pay | Admitting: Internal Medicine

## 2022-01-05 VITALS — BP 142/80 | HR 62 | Temp 98.0°F | Ht 69.0 in | Wt 184.2 lb

## 2022-01-05 DIAGNOSIS — I10 Essential (primary) hypertension: Secondary | ICD-10-CM

## 2022-01-05 DIAGNOSIS — Z0000 Encounter for general adult medical examination without abnormal findings: Secondary | ICD-10-CM | POA: Diagnosis not present

## 2022-01-05 DIAGNOSIS — K219 Gastro-esophageal reflux disease without esophagitis: Secondary | ICD-10-CM | POA: Diagnosis not present

## 2022-01-05 DIAGNOSIS — R6889 Other general symptoms and signs: Secondary | ICD-10-CM | POA: Insufficient documentation

## 2022-01-05 DIAGNOSIS — E1140 Type 2 diabetes mellitus with diabetic neuropathy, unspecified: Secondary | ICD-10-CM | POA: Diagnosis not present

## 2022-01-05 DIAGNOSIS — E7849 Other hyperlipidemia: Secondary | ICD-10-CM

## 2022-01-05 DIAGNOSIS — R300 Dysuria: Secondary | ICD-10-CM

## 2022-01-05 DIAGNOSIS — M85852 Other specified disorders of bone density and structure, left thigh: Secondary | ICD-10-CM | POA: Diagnosis not present

## 2022-01-05 DIAGNOSIS — I480 Paroxysmal atrial fibrillation: Secondary | ICD-10-CM

## 2022-01-05 LAB — COMPREHENSIVE METABOLIC PANEL
ALT: 14 U/L (ref 0–35)
AST: 17 U/L (ref 0–37)
Albumin: 4.1 g/dL (ref 3.5–5.2)
Alkaline Phosphatase: 63 U/L (ref 39–117)
BUN: 9 mg/dL (ref 6–23)
CO2: 26 mEq/L (ref 19–32)
Calcium: 9.4 mg/dL (ref 8.4–10.5)
Chloride: 106 mEq/L (ref 96–112)
Creatinine, Ser: 0.97 mg/dL (ref 0.40–1.20)
GFR: 57.12 mL/min — ABNORMAL LOW (ref >60.00)
Glucose, Bld: 85 mg/dL (ref 70–99)
Potassium: 3.8 mEq/L (ref 3.5–5.1)
Sodium: 141 mEq/L (ref 135–145)
Total Bilirubin: 0.6 mg/dL (ref 0.2–1.2)
Total Protein: 7.6 g/dL (ref 6.0–8.3)

## 2022-01-05 LAB — LIPID PANEL
Cholesterol: 138 mg/dL (ref 0–200)
HDL: 51 mg/dL (ref >39.00)
LDL Cholesterol: 78 mg/dL (ref 0–99)
NonHDL: 86.78
Total CHOL/HDL Ratio: 3
Triglycerides: 43 mg/dL (ref 0.0–149.0)
VLDL: 8.6 mg/dL (ref 0.0–40.0)

## 2022-01-05 LAB — CBC WITH DIFFERENTIAL/PLATELET
Basophils Absolute: 0 10*3/uL (ref 0.0–0.1)
Basophils Relative: 0.6 % (ref 0.0–3.0)
Eosinophils Absolute: 0 10*3/uL (ref 0.0–0.7)
Eosinophils Relative: 0.6 % (ref 0.0–5.0)
HCT: 37.8 % (ref 36.0–46.0)
Hemoglobin: 12.4 g/dL (ref 12.0–15.0)
Lymphocytes Relative: 32.6 % (ref 12.0–46.0)
Lymphs Abs: 0.9 10*3/uL (ref 0.7–4.0)
MCHC: 32.9 g/dL (ref 30.0–36.0)
MCV: 92.2 fl (ref 78.0–100.0)
Monocytes Absolute: 0.2 10*3/uL (ref 0.1–1.0)
Monocytes Relative: 7.7 % (ref 3.0–12.0)
Neutro Abs: 1.6 10*3/uL (ref 1.4–7.7)
Neutrophils Relative %: 58.5 % (ref 43.0–77.0)
Platelets: 197 10*3/uL (ref 150.0–400.0)
RBC: 4.1 Mil/uL (ref 3.87–5.11)
RDW: 14.2 % (ref 11.5–15.5)
WBC: 2.8 10*3/uL — ABNORMAL LOW (ref 4.0–10.5)

## 2022-01-05 LAB — URINALYSIS, ROUTINE W REFLEX MICROSCOPIC
Bilirubin Urine: NEGATIVE
Hgb urine dipstick: NEGATIVE
Ketones, ur: NEGATIVE
Leukocytes,Ua: NEGATIVE
Nitrite: NEGATIVE
RBC / HPF: NONE SEEN (ref 0–?)
Specific Gravity, Urine: 1.02 (ref 1.000–1.030)
Total Protein, Urine: NEGATIVE
Urine Glucose: NEGATIVE
Urobilinogen, UA: 0.2 (ref 0.0–1.0)
pH: 6 (ref 5.0–8.0)

## 2022-01-05 LAB — HEMOGLOBIN A1C: Hgb A1c MFr Bld: 6.2 % (ref 4.6–6.5)

## 2022-01-05 LAB — TSH: TSH: 2.11 u[IU]/mL (ref 0.35–5.50)

## 2022-01-05 NOTE — Assessment & Plan Note (Signed)
Chronic DEXA up-to-date Encouraged regular exercise

## 2022-01-05 NOTE — Assessment & Plan Note (Signed)
Chronic Following with cardiology Paroxysmal atrial fibrillation On metoprolol 25 mg twice daily and Eliquis 5 mg twice daily

## 2022-01-05 NOTE — Assessment & Plan Note (Signed)
Right foot seems heavy Notices this most when driving may be going up stairs ?  Dropfoot versus neuropathy versus other Will refer to neurology

## 2022-01-05 NOTE — Assessment & Plan Note (Signed)
Chronic Regular exercise and healthy diet encouraged Check lipid panel  Continue atorvastatin 10 mg daily 

## 2022-01-05 NOTE — Assessment & Plan Note (Signed)
Chronic   Lab Results  Component Value Date   HGBA1C 6.1 07/04/2021   Sugars well controlled Testing sugars 1 times a day-most of her sugars are in the 90s Check A1c Continue lifestyle control Stressed regular exercise, diabetic diet

## 2022-01-05 NOTE — Assessment & Plan Note (Signed)
Chronic GERD controlled Continue omeprazole 40 mg daily 

## 2022-01-05 NOTE — Assessment & Plan Note (Signed)
Chronic Blood pressure well controlled CMP Continue benazepril 40 mg daily, diltiazem 120 mg daily, metoprolol 25 mg twice daily

## 2022-01-05 NOTE — Assessment & Plan Note (Signed)
Frequency and some dysuria Check UA, urine culture

## 2022-01-06 LAB — URINE CULTURE: Result:: NO GROWTH

## 2022-01-07 NOTE — Progress Notes (Signed)
Office Visit Note  Patient: Angela Simmons             Date of Birth: 06/22/1945           MRN: [id number]             PCP: Binnie Rail, MD Referring: Binnie Rail, MD Visit Date: 01/21/2022 Occupation: '@GUAROCC'$ @  Subjective:  Pain in multiple joints  History of Present Illness: LEANDRIA THIER is a 76 y.o. female with history of Sjogren's, osteoarthritis, degenerative disc disease and polymyalgia rheumatica.  She states she has been experiencing increased pain and discomfort in her neck, bilateral hands, bilateral knee joints and her legs.  She has not noticed any increased muscular weakness or tenderness.  She denies any episodes of polymyalgia rheumatica flare.  She has been taking hydroxychloroquine to 200 mg p.o. twice daily.  She continues to have dry mouth and dry eyes.  She has been using some over-the-counter products intermittently.    Activities of Daily Living:  Patient reports morning stiffness for 20 minutes.   Patient Denies nocturnal pain.  Difficulty dressing/grooming: Denies Difficulty climbing stairs: Denies Difficulty getting out of chair: Denies Difficulty using hands for taps, buttons, cutlery, and/or writing: Reports  Review of Systems  Constitutional:  Positive for fatigue.  HENT:  Positive for mouth dryness. Negative for mouth sores.   Eyes:  Positive for dryness.  Respiratory:  Negative for difficulty breathing.   Cardiovascular:  Negative for chest pain and palpitations.  Gastrointestinal:  Negative for blood in stool, constipation and diarrhea.  Endocrine: Negative for increased urination.  Genitourinary:  Negative for involuntary urination.  Musculoskeletal:  Positive for joint pain, gait problem, joint pain, joint swelling, myalgias, muscle weakness, morning stiffness, muscle tenderness and myalgias.  Skin:  Positive for hair loss. Negative for color change, rash and sensitivity to sunlight.  Allergic/Immunologic: Negative for susceptible to  infections.  Neurological:  Positive for headaches. Negative for dizziness.  Hematological:  Negative for swollen glands.  Psychiatric/Behavioral:  Negative for depressed mood and sleep disturbance. The patient is not nervous/anxious.     PMFS History:  Patient Active Problem List   Diagnosis Date Noted   Sensation of heaviness 01/05/2022   Mid back pain on right side 11/13/2021   Insect bite of head 06/09/2021   Tick bite of ear, right, initial encounter 06/09/2021   Cervical radiculopathy due to degenerative joint disease of spine 02/13/2021   Right-sided chest pain 02/13/2021   Bilateral primary osteoarthritis of knee 02/13/2021   Right knee pain 01/03/2021   Radiculopathy 01/03/2021   Atrial flutter ([place]) 08/22/2020   Aortic atherosclerosis ([place]) 06/28/2020   Secondary hypercoagulable state ([place]) 12/26/2019   Anticoagulated 12/05/2019   PAF (paroxysmal atrial fibrillation) ([place]) 08/04/2019   Thrush 08/02/2019   DOE (dyspnea on exertion) 04/03/2019   Ankle swelling, left 04/03/2019   Hyperpigmented skin lesion 04/03/2019   Poor balance 12/06/2018   Headache 12/06/2018   Vertigo 06/07/2018   Urine frequency 06/07/2018   Herpes zoster without complication 27/06/2374   Tingling in extremities 11/26/2017   Ischial bursitis of left side 06/02/2017   Sicca syndrome ([place]) 06/23/2016   Bilateral leg edema 04/07/2016   Burning sensation of skin 04/07/2016   PMR (polymyalgia rheumatica) ([place]) 03/27/2016   Osteopenia 03/04/2016   Dysuria 02/26/2016   Autoimmune disease ([place]) 03/18/2015   Inflammatory arthritis 03/18/2015   Joint pain 11/22/2014   Overweight (BMI 25.0-29.9) 08/18/2014   Low back pain with radiation 08/18/2014  Hyperlipidemia 06/28/2014   Chest pain with moderate risk of acute coronary syndrome 04/11/2014   Tendonitis of shoulder 08/29/2013   S/P right mastectomy 11/14/2012   Type 2 diabetes, controlled, with neuropathy ([place]) 10/05/2012   Allergic rhinitis  01/26/2011   GERD 11/10/2009   INTERSTITIAL CYSTITIS 10/31/2008   BENIGN NEOPLASM OF VULVA 02/01/2008   Essential hypertension, benign 02/08/2007   History of cancer of right breast 02/08/2007    Past Medical History:  Diagnosis Date   Breast cancer ([place]) 1990s   right breast   Collagen vascular disease ([place])    Diabetes mellitus without complication ([place])    GERD (gastroesophageal reflux disease)    HTN (hypertension)    Hypercholesterolemia    Obesity    Osteoarthritis     Family History  Problem Relation Age of Onset   Diabetes Mother        diabetic coma   Heart failure Mother    Diabetes Father    Emphysema Father    Diabetes Sister    Hypercholesterolemia Sister    Hypercholesterolemia Brother    Diabetes Brother    Hypertension Brother    Hypercholesterolemia Brother    Diabetes Sister    Hypertension Sister    Hypercholesterolemia Sister    Hypercholesterolemia Sister    Hypercholesterolemia Sister    Diabetes Brother    Hypertension Brother    Colon cancer Neg Hx    Past Surgical History:  Procedure Laterality Date   ABDOMINAL HYSTERECTOMY  1993   [place], [place]   AORTIC ARCH ANGIOGRAPHY N/A 12/07/2019   Procedure: AORTIC ARCH ANGIOGRAPHY;  Surgeon: Lorretta Harp, MD;  Location: [facility];  Service: Cardiovascular;  Laterality: N/A;   COLONOSCOPY  May 2003   Dr. Tamala Julian: normal   COLONOSCOPY  06/22/2011   Procedure: COLONOSCOPY;  Surgeon: Danie Binder, MD;  [facility];  Service: Endoscopy;  Laterality: N/A;  10:30   LEFT HEART CATH AND CORONARY ANGIOGRAPHY N/A 12/07/2019   Procedure: LEFT HEART CATH AND CORONARY ANGIOGRAPHY;  Surgeon: Lorretta Harp, MD;  Location: [facility];  Service: Cardiovascular;  Laterality: N/A;   MASTECTOMY  1995   right    Social History   Social History Narrative   Lives at home with husband   Caffeine, maybe 1 a day   Immunization History  Administered Date(s) Administered   Fluad  Quad(high Dose 65+) 12/06/2018, 12/27/2019, 01/03/2021   Influenza Split 11/14/2010   Influenza Whole 10/18/2006, 10/31/2008, 11/06/2009   Influenza, High Dose Seasonal PF 11/22/2014, 02/21/2016, 12/18/2016, 11/05/2017   Influenza,inj,Quad PF,6+ Mos 11/14/2012, 11/23/2013   Moderna Sars-Covid-2 Vaccination 03/16/2019, 04/13/2019   Pneumococcal Conjugate-13 04/11/2014   Pneumococcal Polysaccharide-23 09/17/2011   Td 07/05/2003   Zoster Recombinat (Shingrix) 11/30/2017, 02/02/2018   Zoster, Live 06/15/2013     Objective: Vital Signs: BP (!) 191/82 (BP Location: Left Arm, Patient Position: Sitting, Cuff Size: Normal)   Pulse (!) 51   Resp 15   Ht '5\' 9"'$  (1.753 m)   Wt 185 lb 12.8 oz (84.3 kg)   BMI 27.44 kg/m    Physical Exam Vitals and nursing note reviewed.  Constitutional:      Appearance: She is well-developed.  HENT:     Head: Normocephalic and atraumatic.  Eyes:     Conjunctiva/sclera: Conjunctivae normal.  Cardiovascular:     Rate and Rhythm: Normal rate and regular rhythm.     Heart sounds: Normal heart sounds.  Pulmonary:  Effort: Pulmonary effort is normal.     Breath sounds: Normal breath sounds.  Abdominal:     General: Bowel sounds are normal.     Palpations: Abdomen is soft.  Musculoskeletal:     Cervical back: Normal range of motion.  Lymphadenopathy:     Cervical: No cervical adenopathy.  Skin:    General: Skin is warm and dry.     Capillary Refill: Capillary refill takes less than 2 seconds.  Neurological:     Mental Status: She is alert and oriented to person, place, and time.  Psychiatric:        Behavior: Behavior normal.      Musculoskeletal Exam: She had limited lateral rotation.  Lumbar spine was in good range of motion.  She had no SI joint tenderness.  Shoulder joints, elbow joints, wrist joints were in good range of motion.  She had bilateral PIP and DIP thickening with mucinous cyst noted on the left first PIP joint.  Hip joints and knee  joints with good range of motion.  She had tenderness over bilateral trochanteric bursa and discomfort range of motion of her knee joints without any warmth swelling or effusion.  There was no tenderness over ankles or MTPs.  Bilateral hammertoes were noted.  Right foot dorsal spur was noted.  CDAI Exam: CDAI Score: -- Patient Global: --; Provider Global: -- Swollen: --; Tender: -- Joint Exam 01/21/2022   No joint exam has been documented for this visit   There is currently no information documented on the homunculus. Go to the Rheumatology activity and complete the homunculus joint exam.  Investigation: No additional findings.  Imaging: No results found.  Recent Labs: Lab Results  Component Value Date   WBC 2.8 (L) 01/05/2022   HGB 12.4 01/05/2022   PLT 197.0 01/05/2022   NA 141 01/05/2022   K 3.8 01/05/2022   CL 106 01/05/2022   CO2 26 01/05/2022   GLUCOSE 85 01/05/2022   BUN 9 01/05/2022   CREATININE 0.97 01/05/2022   BILITOT 0.6 01/05/2022   ALKPHOS 63 01/05/2022   AST 17 01/05/2022   ALT 14 01/05/2022   PROT 7.6 01/05/2022   ALBUMIN 4.1 01/05/2022   CALCIUM 9.4 01/05/2022   GFRAA 72 04/23/2020   QFTBGOLDPLUS Negative 07/25/2019    Speciality Comments: PLQ Eye Exam: 01/09/2022 WNL @ [facility] [place] up in 1 year  Procedures:  No procedures performed Allergies: Levofloxacin, Nitrofurantoin, Penicillins, Sulfonamide derivatives, and Pantoprazole   Assessment / Plan:     Visit Diagnoses: Sjogren's syndrome with keratoconjunctivitis sicca (HCC) - ANA+, sicca symptoms: She continues to have dry mouth and dry eyes.  She has been using over-the-counter products intermittently.  Regular use of over-the-counter products was discussed.  I also discussed the option of using pilocarpine.  Indications side effects contraindications were discussed.  Patient is not ready to start on pilocarpine.  I gave her information to review.  She has been taking hydroxychloroquine 200  mg p.o. daily.  Patient had recent labs.  We will check autoimmune labs with her next blood work.  PMR (polymyalgia rheumatica) (HCC) - In remission.  She had no muscular weakness or tenderness.  High risk medication use - Plaquenil 200 mg 1 tablet by mouth daily.  Patient states that she has been taking hydroxychloroquine on a regular basis.  Hydroxychloroquine level was 360 on 11/11/2020. PLQ Eye Exam: 01/09/2022.  Information on immunization was placed in the AVS.  Digital mucinous cyst - Left first PIP  joint.  The cyst is not fluctuant.  I advised her to observe it for now.  Primary osteoarthritis of both hands-she had bilateral PIP and DIP thickening.  No synovitis was noted.  Trochanteric bursitis of both hips-she had tenderness over bilateral trochanteric bursa.  IT band stretches were discussed.  Primary osteoarthritis of both knees -she continues to have pain and discomfort in her bilateral knee joints.  No warmth swelling or effusion was noted.  X-rays of the right knee 02/27/2021 and results were consistent with mild to moderate lateral compartment and moderate patellofemoral compartment osteoarthritis.  Lower extremity muscle strengthening exercises were discussed.  A handout was given.  Primary osteoarthritis of both feet-patient had bilateral hammertoes and dorsal spurs.  She complains of discomfort in her toes when she walks.  Patient goes to a podiatrist.  I advised her to discuss orthotics with her podiatrist.  DDD (degenerative disc disease), cervical-she had limited range of motion of the cervical spine with some discomfort.  Osteopenia of multiple sites - DEXA updated on 08/26/20 ordered by Dr. Quay Burow: LFN -1.4.  Calcium rich diet and exercise was advised.  History of hypertension - Pressure was elevated at 178/90.  Repeat blood pressure was also elevated.  Patient was advised to monitor blood pressure closely and follow-up with the PCP.  Other medical problems are listed as  follows:  History of hyperlipidemia  History of cancer of right breast  History of peripheral neuropathy  S/P right mastectomy  History of gastroesophageal reflux (GERD)  Orders: No orders of the defined types were placed in this encounter.  No orders of the defined types were placed in this encounter.    Follow-Up Instructions: Return in about 5 months (around 06/22/2022) for Sjogren's, PMR.   Bo Merino, MD  Note - This record has been created using Editor, commissioning.  Chart creation errors have been sought, but may not always  have been located. Such creation errors do not reflect on  the standard of medical care.

## 2022-01-08 DIAGNOSIS — L84 Corns and callosities: Secondary | ICD-10-CM | POA: Diagnosis not present

## 2022-01-08 DIAGNOSIS — M79676 Pain in unspecified toe(s): Secondary | ICD-10-CM | POA: Diagnosis not present

## 2022-01-08 DIAGNOSIS — E1142 Type 2 diabetes mellitus with diabetic polyneuropathy: Secondary | ICD-10-CM | POA: Diagnosis not present

## 2022-01-08 DIAGNOSIS — B351 Tinea unguium: Secondary | ICD-10-CM | POA: Diagnosis not present

## 2022-01-09 DIAGNOSIS — H40021 Open angle with borderline findings, high risk, right eye: Secondary | ICD-10-CM | POA: Diagnosis not present

## 2022-01-13 ENCOUNTER — Telehealth: Payer: Self-pay | Admitting: Physician Assistant

## 2022-01-13 NOTE — Telephone Encounter (Signed)
I spoke with patient. She states she has never heard of WPW and states she has never been told she has this time of rhythm .

## 2022-01-13 NOTE — Telephone Encounter (Signed)
I spoke with [facility],they did not contact cardiology. I will call patient's local [facility].   I spoke with the [facility] pharmacist, they did not contact us.   I called [facility], they state they did not contact us and that all patients rx history was transferred to [facility].    I let daughter,[name], know that the communication was a mistake. She confirmed they use [facility] in [place].

## 2022-01-13 NOTE — Telephone Encounter (Signed)
No history of WPW and no evidence of WPW by EKG. Must be some form of error on there end, diltiazem is fine  Zandra Abts MD

## 2022-01-13 NOTE — Telephone Encounter (Signed)
Daughter, [name], called to say she had spoken with her mother and they have no idea where the WPW diagnosis came from either.

## 2022-01-13 NOTE — Telephone Encounter (Signed)
Received inbox message from [facility] regarding this patient being on diltiazem. The document states "Potential Clinical Concern: Drug-Disease Interaction: Wolff-Parkinson-White syndrome or accessory conduction pathway disorders and non-dihydropyridine calcium channel blockers"  In comprehensive review of chart, do not see any history of "Wolff-Parkinson-White syndrome or accessory conduction pathway" noted in prior cardiology notes. Will route to triage to reach out to patient to verify that she has not been diagnosed with this condition elsewhere in the meantime. Will also CC to Dr. Harl Bowie in case he is aware of any interim developments though this was not mentioned in last OV 08/2021 either.

## 2022-01-21 ENCOUNTER — Encounter: Payer: Self-pay | Admitting: Rheumatology

## 2022-01-21 ENCOUNTER — Ambulatory Visit: Payer: Medicare Other | Attending: Rheumatology | Admitting: Rheumatology

## 2022-01-21 VITALS — BP 191/82 | HR 51 | Resp 15 | Ht 69.0 in | Wt 185.8 lb

## 2022-01-21 DIAGNOSIS — M5030 Other cervical disc degeneration, unspecified cervical region: Secondary | ICD-10-CM | POA: Diagnosis not present

## 2022-01-21 DIAGNOSIS — M19041 Primary osteoarthritis, right hand: Secondary | ICD-10-CM

## 2022-01-21 DIAGNOSIS — Z8679 Personal history of other diseases of the circulatory system: Secondary | ICD-10-CM

## 2022-01-21 DIAGNOSIS — M7062 Trochanteric bursitis, left hip: Secondary | ICD-10-CM

## 2022-01-21 DIAGNOSIS — M353 Polymyalgia rheumatica: Secondary | ICD-10-CM

## 2022-01-21 DIAGNOSIS — Z8719 Personal history of other diseases of the digestive system: Secondary | ICD-10-CM

## 2022-01-21 DIAGNOSIS — Z9011 Acquired absence of right breast and nipple: Secondary | ICD-10-CM

## 2022-01-21 DIAGNOSIS — R5383 Other fatigue: Secondary | ICD-10-CM

## 2022-01-21 DIAGNOSIS — M19042 Primary osteoarthritis, left hand: Secondary | ICD-10-CM

## 2022-01-21 DIAGNOSIS — R202 Paresthesia of skin: Secondary | ICD-10-CM

## 2022-01-21 DIAGNOSIS — M67449 Ganglion, unspecified hand: Secondary | ICD-10-CM | POA: Diagnosis not present

## 2022-01-21 DIAGNOSIS — Z853 Personal history of malignant neoplasm of breast: Secondary | ICD-10-CM

## 2022-01-21 DIAGNOSIS — Z79899 Other long term (current) drug therapy: Secondary | ICD-10-CM | POA: Diagnosis not present

## 2022-01-21 DIAGNOSIS — M3501 Sicca syndrome with keratoconjunctivitis: Secondary | ICD-10-CM

## 2022-01-21 DIAGNOSIS — M19071 Primary osteoarthritis, right ankle and foot: Secondary | ICD-10-CM

## 2022-01-21 DIAGNOSIS — Z8639 Personal history of other endocrine, nutritional and metabolic disease: Secondary | ICD-10-CM

## 2022-01-21 DIAGNOSIS — M8589 Other specified disorders of bone density and structure, multiple sites: Secondary | ICD-10-CM | POA: Diagnosis not present

## 2022-01-21 DIAGNOSIS — M7061 Trochanteric bursitis, right hip: Secondary | ICD-10-CM | POA: Diagnosis not present

## 2022-01-21 DIAGNOSIS — M170 Bilateral primary osteoarthritis of knee: Secondary | ICD-10-CM | POA: Diagnosis not present

## 2022-01-21 DIAGNOSIS — M19072 Primary osteoarthritis, left ankle and foot: Secondary | ICD-10-CM

## 2022-01-21 DIAGNOSIS — Z8669 Personal history of other diseases of the nervous system and sense organs: Secondary | ICD-10-CM

## 2022-01-21 NOTE — Patient Instructions (Addendum)
Pilocarpine Tablets What is this medication? PILOCARPINE (PYE loe KAR peen) treats dry mouth. It works by increasing the amount of saliva in the mouth, which makes it easier to speak and swallow. This medicine may be used for other purposes; ask your health care provider or pharmacist if you have questions. COMMON BRAND NAME(S): Salagen What should I tell my care team before I take this medication? They need to know if you have any of these conditions: Eye infection or other eye problems Glaucoma Heart disease Liver disease Lung or breathing disease, such as asthma An unusual or allergic reaction to pilocarpine, other medications, foods, dyes, or preservatives Pregnant or trying to get pregnant Breast-feeding How should I use this medication? Take this medication by mouth with a full glass of water. Take it as directed on the prescription label at the same time every day. Keep taking it unless your care team tells you to stop. Talk to your care team about the use of this medication in children. Special care may be needed. Overdosage: If you think you have taken too much of this medicine contact a poison control center or emergency room at once. NOTE: This medicine is only for you. Do not share this medicine with others. What if I miss a dose? If you miss a dose, take it as soon as you can. If it is almost time for your next dose, take only that dose. Do not take double or extra doses. What may interact with this medication? Antihistamines for allergy, cough, and cold Atropine Certain medications for Alzheimer disease, such as donepezil, galantamine, rivastigmine Certain medications for bladder problems, such as bethanechol, oxybutynin, tolterodine Certain medications for Parkinson disease, such as benztropine, trihexyphenidyl Certain medications for quitting smoking, such as nicotine Certain medications for stomach problems, such as dicyclomine, hyoscyamine Certain medications for  travel sickness, such as scopolamine Ipratropium Medications for blood pressure or heart problems, such as metoprolol This list may not describe all possible interactions. Give your health care provider a list of all the medicines, herbs, non-prescription drugs, or dietary supplements you use. Also tell them if you smoke, drink alcohol, or use illegal drugs. Some items may interact with your medicine. What should I watch for while using this medication? Visit your care team for regular checks on your progress. Tell your care team if your symptoms do not get better or if they get worse. You may get blurry vision or have trouble telling how far something is from you. This may be a problem at night or when the lights are low. Do not drive, use machinery, or do anything that needs clear vision until you know how this medication affects you. If you sweat a lot, drink enough to replace fluids. Do not get dehydrated. What side effects may I notice from receiving this medication? Side effects that you should report to your care team as soon as possible: Allergic reactions--skin rash, itching, hives, swelling of the face, lips, tongue, or throat Fast or irregular heartbeat Increase in blood pressure Low blood pressure--dizziness, feeling faint or lightheaded, blurry vision Slow heartbeat--dizziness, feeling faint or lightheaded, confusion, trouble breathing, unusual weakness or fatigue Side effects that usually do not require medical attention (report to your care team if they continue or are bothersome): Change in vision Chills Diarrhea Excessive sweating Flushing Headache Increased need to urinate This list may not describe all possible side effects. Call your doctor for medical advice about side effects. You may report side effects to FDA at 1-800-FDA-1088.  Where should I keep my medication? Keep out of the reach of children and pets. Store at room temperature between 15 and 30 degrees C (59 and  86 degrees F). Get rid of any unused medication after the expiration date. To get rid of medications that are no longer needed or have expired: Take the medications to a medication take-back program. Check with your pharmacy or law enforcement to find a location. If your cannot return the medication, check the label or package insert to see if the medication should be thrown out in the garbage or flushed down the toilet. If you are not sure, ask your care team. If it is safe to put it in the trash, take the medication out of the container. Mix the medication with cat litter, dirt, coffee grounds, or other unwanted substance. Seal the mixture in a bag or container. Put it in the trash. NOTE: This sheet is a summary. It may not cover all possible information. If you have questions about this medicine, talk to your doctor, pharmacist, or health care provider.  2023 Elsevier/Gold Standard (2020-12-20 00:00:00)  Vaccines You are taking a medication(s) that can suppress your immune system.  The following immunizations are recommended: Flu annually Covid-19  Td/Tdap (tetanus, diphtheria, pertussis) every 10 years Pneumonia (Prevnar 15 then Pneumovax 23 at least 1 year apart.  Alternatively, can take Prevnar 20 without needing additional dose) Shingrix: 2 doses from 4 weeks to 6 months apart  Please check with your PCP to make sure you are up to date.   Exercises for Chronic Knee Pain Chronic knee pain is pain that lasts longer than 3 months. For most people with chronic knee pain, exercise and weight loss is an important part of treatment. Your health care provider may want you to focus on: Strengthening the muscles that support your knee. This can take pressure off your knee and lessen pain. Preventing knee stiffness. Maintaining or increasing how far you can move your knee. Losing weight (if this applies) to take pressure off your knee, decrease your risk for injury, and make it easier for you to  exercise. Your health care provider will help you develop an exercise program that matches your needs and physical abilities. Below are simple, low-impact exercises you can do at home. Ask your health care provider or a physical therapist how often you should do your exercise program and how many times to repeat each exercise. General safety tips Follow these safety tips for exercising with chronic knee pain: Get your health care provider's approval before doing any exercises. Start slowly and stop any time an exercise causes pain. Do not exercise if your knee pain is flaring up. Warm up first. Stretching a cold muscle can cause an injury. Do 5-10 minutes of easy movement or light stretching before beginning your exercise routine. Do 5-10 minutes of low-impact activity (like walking or cycling) before starting strengthening exercises. Contact your health care provider any time you have pain during or after exercising. Exercise may cause discomfort but should not be painful. It is normal to be a little stiff or sore after exercising.  Stretching and range-of-motion exercises Front thigh stretch  Stand up straight and support your body by holding on to a chair or resting one hand on a wall. With your legs straight and close together, bend one knee to lift your heel up toward your buttocks. Using one hand for support, grab your ankle with your free hand. Pull your foot up closer toward your  buttocks to feel the stretch in front of your thigh. Hold the stretch for 30 seconds. Repeat __________ times. Complete this exercise __________ times a day. Back thigh stretch  Sit on the floor with your back straight and your legs out straight in front of you. Place the palms of your hands on the floor and slide them toward your feet as you bend at the hip. Try to touch your nose to your knees and feel the stretch in the back of your thighs. Hold for 30 seconds. Repeat __________ times. Complete this  exercise __________ times a day. Calf stretch  Stand facing a wall. Place the palms of your hands flat against the wall, arms extended, and lean slightly against the wall. Get into a lunge position with one leg bent at the knee and the other leg stretched out straight behind you. Keep both feet facing the wall and increase the bend in your knee while keeping the heel of the other leg flat on the ground. You should feel the stretch in your calf. Hold for 30 seconds. Repeat __________ times. Complete this exercise __________ times a day. Strengthening exercises Straight leg lift Lie on your back with one knee bent and the other leg out straight. Slowly lift the straight leg without bending the knee. Lift until your foot is about 12 inches (30 cm) off the floor. Hold for 3-5 seconds and slowly lower your leg. Repeat __________ times. Complete this exercise __________ times a day. Single leg dip Stand between two chairs and put both hands on the backs of the chairs for support. Extend one leg out straight with your body weight resting on the heel of the standing leg. Slowly bend your standing knee to dip your body to the level that is comfortable for you. Hold for 3-5 seconds. Repeat __________ times. Complete this exercise __________ times a day. Hamstring curls Stand straight, knees close together, facing the back of a chair. Hold on to the back of a chair with both hands. Keep one leg straight. Bend the other knee while bringing the heel up toward the buttock until the knee is bent at a 90-degree angle (right angle). Hold for 3-5 seconds. Repeat __________ times. Complete this exercise __________ times a day. Wall squat Stand straight with your back, hips, and head against a wall. Step forward one foot at a time with your back still against the wall. Your feet should be 2 feet (61 cm) from the wall at shoulder width. Keeping your back, hips, and head against the wall, slide down the  wall to as close of a sitting position as you can get. Hold for 5-10 seconds, then slowly slide back up. Repeat __________ times. Complete this exercise __________ times a day. Step-ups Step up with one foot onto a sturdy platform or stool that is about 6 inches (15 cm) high. Face sideways with one foot on the platform and one on the ground. Place all your weight on the platform foot and lift your body off the ground until your knee extends. Let your other leg hang free to the side. Hold for 3-5 seconds then slowly lower your weight down to the floor foot. Repeat __________ times. Complete this exercise __________ times a day. Contact a health care provider if: Your exercise causes pain. Your pain is worse after you exercise. Your pain prevents you from doing your exercises. This information is not intended to replace advice given to you by your health care provider. Make sure you  discuss any questions you have with your health care provider. Document Revised: 05/11/2019 Document Reviewed: 01/02/2019 Elsevier Patient Education  Page.

## 2022-01-26 ENCOUNTER — Encounter: Payer: Self-pay | Admitting: Internal Medicine

## 2022-01-26 ENCOUNTER — Encounter: Payer: Self-pay | Admitting: Rheumatology

## 2022-01-26 NOTE — Telephone Encounter (Signed)
I called patient, message sent to Dr. Estanislado Pandy.

## 2022-01-26 NOTE — Telephone Encounter (Signed)
Patient has some left ankle swelling, some bil leg swelling starting today, some warm to the touch, no redness. Patient will contact primary care 01/27/2022.

## 2022-01-27 ENCOUNTER — Encounter: Payer: Self-pay | Admitting: Internal Medicine

## 2022-01-27 NOTE — Telephone Encounter (Signed)
I called patient.  She states that the swelling on her legs resolved.  She did not go to see her PCP.

## 2022-01-30 ENCOUNTER — Other Ambulatory Visit: Payer: Self-pay | Admitting: Internal Medicine

## 2022-01-30 ENCOUNTER — Other Ambulatory Visit: Payer: Self-pay

## 2022-01-30 MED ORDER — BENAZEPRIL HCL 40 MG PO TABS: 40.0000 mg | ORAL_TABLET | Freq: Every day | ORAL | 0 refills | Status: DC

## 2022-02-16 ENCOUNTER — Other Ambulatory Visit: Payer: Self-pay

## 2022-02-16 ENCOUNTER — Emergency Department (HOSPITAL_COMMUNITY)
Admission: EM | Admit: 2022-02-16 | Discharge: 2022-02-16 | Disposition: A | Discharge status: Home / Self Care | Payer: 59 | Attending: Emergency Medicine | Admitting: Emergency Medicine

## 2022-02-16 ENCOUNTER — Ambulatory Visit
Admission: EM | Admit: 2022-02-16 | Discharge: 2022-02-16 | Disposition: A | Discharge status: Other Acute Inpatient Hospital | Payer: 59

## 2022-02-16 ENCOUNTER — Emergency Department (HOSPITAL_COMMUNITY): Payer: 59

## 2022-02-16 DIAGNOSIS — Z7901 Long term (current) use of anticoagulants: Secondary | ICD-10-CM | POA: Diagnosis not present

## 2022-02-16 DIAGNOSIS — I10 Essential (primary) hypertension: Secondary | ICD-10-CM | POA: Diagnosis not present

## 2022-02-16 DIAGNOSIS — I4892 Unspecified atrial flutter: Secondary | ICD-10-CM | POA: Insufficient documentation

## 2022-02-16 DIAGNOSIS — R002 Palpitations: Secondary | ICD-10-CM | POA: Diagnosis present

## 2022-02-16 DIAGNOSIS — R000 Tachycardia, unspecified: Secondary | ICD-10-CM | POA: Diagnosis not present

## 2022-02-16 DIAGNOSIS — R0602 Shortness of breath: Secondary | ICD-10-CM

## 2022-02-16 DIAGNOSIS — I4891 Unspecified atrial fibrillation: Secondary | ICD-10-CM | POA: Diagnosis not present

## 2022-02-16 DIAGNOSIS — E119 Type 2 diabetes mellitus without complications: Secondary | ICD-10-CM | POA: Diagnosis not present

## 2022-02-16 DIAGNOSIS — Z79899 Other long term (current) drug therapy: Secondary | ICD-10-CM | POA: Diagnosis not present

## 2022-02-16 DIAGNOSIS — I959 Hypotension, unspecified: Secondary | ICD-10-CM | POA: Diagnosis not present

## 2022-02-16 DIAGNOSIS — I213 ST elevation (STEMI) myocardial infarction of unspecified site: Secondary | ICD-10-CM | POA: Diagnosis not present

## 2022-02-16 LAB — CBC WITH DIFFERENTIAL/PLATELET
Abs Immature Granulocytes: 0.01 10*3/uL (ref 0.00–0.07)
Basophils Absolute: 0 10*3/uL (ref 0.0–0.1)
Basophils Relative: 1 %
Eosinophils Absolute: 0 10*3/uL (ref 0.0–0.5)
Eosinophils Relative: 0 %
HCT: 37 % (ref 36.0–46.0)
Hemoglobin: 12.4 g/dL (ref 12.0–15.0)
Immature Granulocytes: 0 %
Lymphocytes Relative: 22 %
Lymphs Abs: 0.9 10*3/uL (ref 0.7–4.0)
MCH: 31.2 pg (ref 26.0–34.0)
MCHC: 33.5 g/dL (ref 30.0–36.0)
MCV: 93.2 fL (ref 80.0–100.0)
Monocytes Absolute: 0.3 10*3/uL (ref 0.1–1.0)
Monocytes Relative: 7 %
Neutro Abs: 2.7 10*3/uL (ref 1.7–7.7)
Neutrophils Relative %: 70 %
Platelets: 180 10*3/uL (ref 150–400)
RBC: 3.97 MIL/uL (ref 3.87–5.11)
RDW: 13.2 % (ref 11.5–15.5)
WBC: 3.8 10*3/uL — ABNORMAL LOW (ref 4.0–10.5)
nRBC: 0 % (ref 0.0–0.2)

## 2022-02-16 LAB — BASIC METABOLIC PANEL
Anion gap: 11 (ref 5–15)
BUN: 6 mg/dL — ABNORMAL LOW (ref 8–23)
CO2: 23 mmol/L (ref 22–32)
Calcium: 8.6 mg/dL — ABNORMAL LOW (ref 8.9–10.3)
Chloride: 105 mmol/L (ref 98–111)
Creatinine, Ser: 0.83 mg/dL (ref 0.44–1.00)
GFR, Estimated: >60 mL/min (ref >60)
Glucose, Bld: 98 mg/dL (ref 70–99)
Potassium: 4 mmol/L (ref 3.5–5.1)
Sodium: 139 mmol/L (ref 135–145)

## 2022-02-16 LAB — CBG MONITORING, ED: Glucose-Capillary: 98 mg/dL (ref 70–99)

## 2022-02-16 LAB — MAGNESIUM: Magnesium: 2.1 mg/dL (ref 1.7–2.4)

## 2022-02-16 LAB — BRAIN NATRIURETIC PEPTIDE: B Natriuretic Peptide: 55.5 pg/mL (ref 0.0–100.0)

## 2022-02-16 LAB — TSH: TSH: 1.71 u[IU]/mL (ref 0.350–4.500)

## 2022-02-16 LAB — TROPONIN I (HIGH SENSITIVITY): Troponin I (High Sensitivity): 19 ng/L — ABNORMAL HIGH (ref <18)

## 2022-02-16 MED ORDER — DILTIAZEM HCL ER COATED BEADS 120 MG PO CP24
120.0000 mg | ORAL_CAPSULE | Freq: Every day | ORAL | Status: DC
  Administered 2022-02-16: 120 mg via ORAL
  Filled 2022-02-16 (×2): qty 1

## 2022-02-16 MED ORDER — DILTIAZEM HCL ER 120 MG PO CP24
120.0000 mg | ORAL_CAPSULE | Freq: Once | ORAL | Status: DC
  Filled 2022-02-16: qty 1

## 2022-02-16 MED ORDER — DILTIAZEM HCL-DEXTROSE 125-5 MG/125ML-% IV SOLN (PREMIX)
5.0000 mg/h | INTRAVENOUS | Status: DC
  Administered 2022-02-16: 5 mg/h via INTRAVENOUS
  Filled 2022-02-16: qty 125

## 2022-02-16 MED ORDER — APIXABAN 5 MG PO TABS
5.0000 mg | ORAL_TABLET | Freq: Once | ORAL | Status: AC
  Administered 2022-02-16: 5 mg via ORAL
  Filled 2022-02-16: qty 1

## 2022-02-16 MED ORDER — DILTIAZEM HCL 25 MG/5ML IV SOLN: 15.0000 mg | Freq: Once | INTRAVENOUS | Status: DC

## 2022-02-16 MED ORDER — METOPROLOL TARTRATE 25 MG PO TABS
25.0000 mg | ORAL_TABLET | Freq: Two times a day (BID) | ORAL | Status: DC
  Administered 2022-02-16: 25 mg via ORAL
  Filled 2022-02-16: qty 1

## 2022-02-16 MED ORDER — DILTIAZEM LOAD VIA INFUSION
15.0000 mg | Freq: Once | INTRAVENOUS | Status: AC
  Administered 2022-02-16: 15 mg via INTRAVENOUS
  Filled 2022-02-16: qty 15

## 2022-02-16 NOTE — ED Provider Notes (Signed)
[facility]    CSN: [id number] Arrival date & time: 02/16/22  1017      History   Chief Complaint Chief Complaint  Patient presents with   Shortness of Breath   Irregular Heart Beat    HPI Angela RYNDERS is a 77 y.o. female.   Patient presents today for shortness of breath and feeling like her heart is fluttering.  Reports she woke up at 9 AM, walked to the bathroom, and felt like her heart was beating fast.  Reports she could see her heart beating through her nightgown.  No chest pain.  She does have shortness of breath with activity.  No dizziness or lightheadedness.  Reports history of A-fib, is compliant with her Eliquis and diltiazem.      Past Medical History:  Diagnosis Date   Breast cancer [facility]) 1990s   right breast   Collagen vascular disease ([place])    Diabetes mellitus without complication ([place])    GERD (gastroesophageal reflux disease)    HTN (hypertension)    Hypercholesterolemia    Obesity    Osteoarthritis     Patient Active Problem List   Diagnosis Date Noted   Sensation of heaviness 01/05/2022   Mid back pain on right side 11/13/2021   Insect bite of head 06/09/2021   Tick bite of ear, right, initial encounter 06/09/2021   Cervical radiculopathy due to degenerative joint disease of spine 02/13/2021   Right-sided chest pain 02/13/2021   Bilateral primary osteoarthritis of knee 02/13/2021   Right knee pain 01/03/2021   Radiculopathy 01/03/2021   Atrial flutter ([place]) 08/22/2020   Aortic atherosclerosis ([place]) 06/28/2020   Secondary hypercoagulable state ([place]) 12/26/2019   Anticoagulated 12/05/2019   PAF (paroxysmal atrial fibrillation) ([place]) 08/04/2019   Thrush 08/02/2019   DOE (dyspnea on exertion) 04/03/2019   Ankle swelling, left 04/03/2019   Hyperpigmented skin lesion 04/03/2019   Poor balance 12/06/2018   Headache 12/06/2018   Vertigo 06/07/2018   Urine frequency 06/07/2018   Herpes zoster without complication 28/78/6767    Tingling in extremities 11/26/2017   Ischial bursitis of left side 06/02/2017   Sicca syndrome ([address]) 06/23/2016   Bilateral leg edema 04/07/2016   Burning sensation of skin 04/07/2016   PMR (polymyalgia rheumatica) ([place]) 03/27/2016   Osteopenia 03/04/2016   Dysuria 02/26/2016   Autoimmune disease ([place]) 03/18/2015   Inflammatory arthritis 03/18/2015   Joint pain 11/22/2014   Overweight (BMI 25.0-29.9) 08/18/2014   Low back pain with radiation 08/18/2014   Hyperlipidemia 06/28/2014   Chest pain with moderate risk of acute coronary syndrome 04/11/2014   Tendonitis of shoulder 08/29/2013   S/P right mastectomy 11/14/2012   Type 2 diabetes, controlled, with neuropathy ([place]) 10/05/2012   Allergic rhinitis 01/26/2011   GERD 11/10/2009   INTERSTITIAL CYSTITIS 10/31/2008   BENIGN NEOPLASM OF VULVA 02/01/2008   Essential hypertension, benign 02/08/2007   History of cancer of right breast 02/08/2007    Past Surgical History:  Procedure Laterality Date   ABDOM[place]CTOMY  [place]   AORTIC ARCH ANGIOGRAPHY N/A 12/07/2019   Procedure: AORTIC ARCH ANGIOGRAPHY;  Surgeon: Lorretta Harp, MD;  Location: [facility];  Service: Cardiovascular;  Laterality: N/A;   COLONOSCOPY  May 2003   Dr. Tamala Julian: normal   COLONOSCOPY  06/22/2011   Procedure: COLONOSCOPY;  Surgeon: Danie Binder, MD;  [facility];  Service: Endoscopy;  Laterality: N/A;  10:30   LEFT HEART CATH AND CORONARY ANGIOGRAPHY  N/A 12/07/2019   Procedure: LEFT HEART CATH AND CORONARY ANGIOGRAPHY;  Surgeon: Lorretta Harp, MD;  Location: [place] [facility];  Service: Cardiovascular;  Laterality: N/A;   MASTECTOMY  1995   right     OB History   No obstetric history on file.      Home Medications    Prior to Admission medications   Medication Sig Start Date End Date Taking? Authorizing Provider  apixaban (ELIQUIS) 5 MG TABS tablet Take 1 tablet by mouth twice daily 12/29/21  Yes Branch,  Alphonse Guild, MD  atorvastatin (LIPITOR) 10 MG tablet Take 1 tablet by mouth daily 12/22/21   Binnie Rail, MD  benazepril (LOTENSIN) 40 MG tablet Take 1 tablet (40 mg total) by mouth daily. 01/30/22   Binnie Rail, MD  Blood Glucose Monitoring Suppl (ONETOUCH VERIO) w/Device KIT USE TO TEST BLOOD SUGAR ONCE DAILY AS DIRECTED 04/05/17   Burns, Claudina Lick, MD  clotrimazole-betamethasone (LOTRISONE) cream Apply topically. 10/27/21   [provider]  diclofenac sodium (VOLTAREN) 1 % GEL Apply 3 g to 3 large joints up to 3 times daily. 07/20/17   Ofilia Neas, PA-C  DILT-XR 120 MG 24 hr capsule TAKE 1 CAPSULE BY MOUTH EVERY DAY 01/24/21   Dunn, Nedra Hai, PA-C  fluticasone (FLONASE) 50 MCG/ACT nasal spray Place 1 spray into both nostrils as needed for allergies or rhinitis.    [provider]  glucose blood test strip USE TO TEST BLOOD SUGAR ONCE DAILY. E11.40 05/20/21   Binnie Rail, MD  hydroxychloroquine (PLAQUENIL) 200 MG tablet Take 1 tablet by mouth once daily 12/23/21   Ofilia Neas, PA-C  loratadine (CLARITIN) 10 MG tablet TAKE 1 TABLET BY MOUTH EVERY DAY 03/25/15   Fayrene Helper, MD  metoprolol tartrate (LOPRESSOR) 25 MG tablet Take 1 tablet (25 mg total) by mouth 2 (two) times daily. 07/24/21   Arnoldo Lenis, MD  Multiple Vitamins-Minerals (CENTRUM SILVER 50+WOMEN) TABS Take 1 tablet by mouth daily at 6 (six) AM. 09/07/19   Burns, Claudina Lick, MD  nitroGLYCERIN (NITROSTAT) 0.4 MG SL tablet Place 1 tablet (0.4 mg total) under the tongue every 5 (five) minutes as needed for chest pain. 12/05/19 02/24/22  Erlene Quan, PA-C  Omega-3 Fatty Acids (FISH OIL) 1000 MG CAPS Take 1,000 mg by mouth daily.    [provider]  omeprazole (PRILOSEC) 40 MG capsule Take 1 capsule by mouth once daily 12/22/21   Binnie Rail, MD  [facility] DELICA LANCETS 94R MISC USE TO TEST ONCE DAILY 03/03/18   Binnie Rail, MD  [facility] VERIO test strip USE TO TEST BLOOD SUGAR ONCE DAILY 07/06/18    Binnie Rail, MD  Turmeric (QC TUMERIC COMPLEX) 500 MG CAPS Take 1 capsule by mouth daily. 09/07/19   Binnie Rail, MD    Family History Family History  Problem Relation Age of Onset   Diabetes Mother        diabetic coma   Heart failure Mother    Diabetes Father    Emphysema Father    Diabetes Sister    Hypercholesterolemia Sister    Hypercholesterolemia Brother    Diabetes Brother    Hypertension Brother    Hypercholesterolemia Brother    Diabetes Sister    Hypertension Sister    Hypercholesterolemia Sister    Hypercholesterolemia Sister    Hypercholesterolemia Sister    Diabetes Brother    Hypertension Brother    Colon cancer  Neg Hx     Social History Social History   Tobacco Use   Smoking status: Never    Passive exposure: Never   Smokeless tobacco: Never  Vaping Use   Vaping Use: Never used  Substance Use Topics   Alcohol use: No   Drug use: Never     Allergies   Levofloxacin, Nitrofurantoin, Penicillins, Sulfonamide derivatives, and Pantoprazole   Review of Systems Review of Systems Per HPI  Physical Exam Triage Vital Signs ED Triage Vitals [02/16/22 1032]  Enc Vitals Group     BP (!) 154/101     Pulse Rate (!) 145     Resp 16     Temp 97.7 F (36.5 C)     Temp Source Oral     SpO2 96 %     Weight      Height      Head Circumference      Peak Flow      Pain Score      Pain Loc      Pain Edu?      Excl. in [place]?    No data found.  Updated Vital Signs BP (!) 154/101 (BP Location: Right Arm)   Pulse (!) 145   Temp 97.7 F (36.5 C) (Oral)   Resp 16   SpO2 96%   Visual Acuity Right Eye Distance:   Left Eye Distance:   Bilateral Distance:    Right Eye Near:   Left Eye Near:    Bilateral Near:     Physical Exam Vitals and nursing note reviewed.  Constitutional:      General: She is not in acute distress.    Appearance: She is well-developed. She is not ill-appearing or toxic-appearing.  HENT:     Head: Normocephalic and  atraumatic.     Mouth/Throat:     Mouth: Mucous membranes are moist.     Pharynx: Oropharynx is clear.  Cardiovascular:     Rate and Rhythm: Regular rhythm. Tachycardia present.  Pulmonary:     Effort: Pulmonary effort is normal. No respiratory distress.     Breath sounds: Normal breath sounds. No decreased breath sounds.  Skin:    General: Skin is warm and dry.     Capillary Refill: Capillary refill takes less than 2 seconds.     Coloration: Skin is not cyanotic.     Findings: No erythema.  Neurological:     Mental Status: She is alert and oriented to person, place, and time.  Psychiatric:        Behavior: Behavior is cooperative.      UC Treatments / Results  Labs (all labs ordered are listed, but only abnormal results are displayed) Labs Reviewed - No data to display  EKG   Radiology No results found.  Procedures Procedures (including critical care time)  Medications Ordered in UC Medications - No data to display  Initial Impression / Assessment and Plan / UC Course  I have reviewed the triage vital signs and the nursing notes.  Pertinent labs & imaging results that were available during my care of the patient were reviewed by me and considered in my medical decision making (see chart for details).   Patient is well-appearing, afebrile, not tachypneic, oxygenating well on room air.  She is hypertensive and tachycardic today in triage.  1. Atrial flutter, unspecified type ([place]) EKG today shows ventricular rate 146 bpm Atrial flutter versus SVT Recommended urgently transporting to ER via ambulance given elevated heart rate  and risk for decompensation quickly Patient is in agreement to this plan EMS activated IV access initiated Patient transported to ER via ambulance, patient in stable condition leaving urgent care  2. Shortness of breath Likely secondary to tachycardia  The patient was given the opportunity to ask questions.  All questions answered to their  satisfaction.  The patient is in agreement to this plan.    Final Clinical Impressions(s) / UC Diagnoses   Final diagnoses:  Shortness of breath  Atrial flutter, unspecified type [facility])     Discharge Instructions      I am concerned about your heart rate today.  We are sending you to the hospital for further evaluation and management of the elevated heart rate.    ED Prescriptions   None    PDMP not reviewed this encounter.   Eulogio Bear, NP 02/16/22 1101

## 2022-02-16 NOTE — ED Notes (Signed)
EMS called, emergency transport requested.

## 2022-02-16 NOTE — ED Provider Notes (Signed)
[facility] Provider Note   CSN: [id number] Arrival date & time: 02/16/22  1148     History  Chief Complaint  Patient presents with   Atrial Flutter    Angela Simmons is a 77 y.o. female.  HPI Patient presents for shortness of breath and palpitations.  Medical history includes GERD, HTN, T2DM, arthritis, osteopenia, pulmonology rheumatica.  She has a history of atrial fibrillation/atrial flutter.  This was noted during a hospital admission in July 2021.  She was started on metoprolol and Eliquis at that time.  Current prescribed medications include Eliquis, diltiazem, and metoprolol.  She states that she has been adherent to these medications, but did not take any this morning.  Onset of symptoms was 9 AM, shortly after waking up.  She was seen in urgent care in [place] this morning.  While there, she was noted to be tachycardic with heart rate in the range of 146.  EKG showed likely atrial flutter.  She was transported to the ED via ambulance.  EMS noted heart rate in the 160s.  EMS gave ASA, NTG, and 1 L of IVF.  Currently, she endorses some mild palpitations that have improved significantly since time of onset.  She does not feel short of breath at rest but does notice it when she gets up and moves at all.  She reports the last time her heart rate was elevated was her initial diagnosis 2 years ago.    Home Medications Prior to Admission medications   Medication Sig Start Date End Date Taking? Authorizing Provider  apixaban (ELIQUIS) 5 MG TABS tablet Take 1 tablet by mouth twice daily 12/29/21   Arnoldo Lenis, MD  atorvastatin (LIPITOR) 10 MG tablet Take 1 tablet by mouth daily 12/22/21   Binnie Rail, MD  benazepril (LOTENSIN) 40 MG tablet Take 1 tablet (40 mg total) by mouth daily. 01/30/22   Binnie Rail, MD  Blood Glucose Monitoring Suppl (ONETOUCH VERIO) w/Device KIT USE TO TEST BLOOD SUGAR ONCE DAILY AS DIRECTED 04/05/17    Burns, Claudina Lick, MD  clotrimazole-betamethasone (LOTRISONE) cream Apply topically. 10/27/21   [provider]  diclofenac sodium (VOLTAREN) 1 % GEL Apply 3 g to 3 large joints up to 3 times daily. 07/20/17   Ofilia Neas, PA-C  DILT-XR 120 MG 24 hr capsule TAKE 1 CAPSULE BY MOUTH EVERY DAY 01/24/21   Dunn, Nedra Hai, PA-C  fluticasone (FLONASE) 50 MCG/ACT nasal spray Place 1 spray into both nostrils as needed for allergies or rhinitis.    [provider]  glucose blood test strip USE TO TEST BLOOD SUGAR ONCE DAILY. E11.40 05/20/21   Binnie Rail, MD  hydroxychloroquine (PLAQUENIL) 200 MG tablet Take 1 tablet by mouth once daily 12/23/21   Ofilia Neas, PA-C  loratadine (CLARITIN) 10 MG tablet TAKE 1 TABLET BY MOUTH EVERY DAY 03/25/15   Fayrene Helper, MD  metoprolol tartrate (LOPRESSOR) 25 MG tablet Take 1 tablet (25 mg total) by mouth 2 (two) times daily. 07/24/21   Arnoldo Lenis, MD  Multiple Vitamins-Minerals (CENTRUM SILVER 50+WOMEN) TABS Take 1 tablet by mouth daily at 6 (six) AM. 09/07/19   Burns, Claudina Lick, MD  nitroGLYCERIN (NITROSTAT) 0.4 MG SL tablet Place 1 tablet (0.4 mg total) under the tongue every 5 (five) minutes as needed for chest pain. 12/05/19 02/24/22  Erlene Quan, PA-C  Omega-3 Fatty Acids (FISH OIL) 1000 MG CAPS Take 1,000 mg  by mouth daily.    [provider]  omeprazole (PRILOSEC) 40 MG capsule Take 1 capsule by mouth once daily 12/22/21   Binnie Rail, MD  [facility] DELICA LANCETS 44I MISC USE TO TEST ONCE DAILY 03/03/18   Binnie Rail, MD  [facility] VERIO test strip USE TO TEST BLOOD SUGAR ONCE DAILY 07/06/18   Binnie Rail, MD  Turmeric (QC TUMERIC COMPLEX) 500 MG CAPS Take 1 capsule by mouth daily. 09/07/19   Binnie Rail, MD      Allergies    Levofloxacin, Nitrofurantoin, Penicillins, Sulfonamide derivatives, and Pantoprazole    Review of Systems   Review of Systems  Respiratory:  Positive for shortness of breath.   Cardiovascular:   Positive for palpitations.  All other systems reviewed and are negative.   Physical Exam Updated Vital Signs BP (!) 152/83   Pulse 67   Temp 98.7 F (37.1 C) (Oral)   Resp 15   Ht '5\' 9"'$  (1.753 m)   Wt 83 kg   SpO2 100%   BMI 27.02 kg/m  Physical Exam Vitals and nursing note reviewed.  Constitutional:      General: She is not in acute distress.    Appearance: Normal appearance. She is well-developed. She is not ill-appearing, toxic-appearing or diaphoretic.  HENT:     Head: Normocephalic and atraumatic.     Right Ear: External ear normal.     Left Ear: External ear normal.     Nose: Nose normal.     Mouth/Throat:     Mouth: Mucous membranes are moist.  Eyes:     Extraocular Movements: Extraocular movements intact.     Conjunctiva/sclera: Conjunctivae normal.  Cardiovascular:     Rate and Rhythm: Regular rhythm. Tachycardia present.     Heart sounds: No murmur heard. Pulmonary:     Effort: Pulmonary effort is normal. No respiratory distress.     Breath sounds: Normal breath sounds. No wheezing or rales.  Abdominal:     General: There is no distension.     Palpations: Abdomen is soft.     Tenderness: There is no abdominal tenderness.  Musculoskeletal:        General: No swelling. Normal range of motion.     Cervical back: Normal range of motion and neck supple.     Right lower leg: No edema.     Left lower leg: No edema.  Skin:    General: Skin is warm and dry.     Capillary Refill: Capillary refill takes less than 2 seconds.  Neurological:     General: No focal deficit present.     Mental Status: She is alert and oriented to person, place, and time.     Cranial Nerves: No cranial nerve deficit.     Sensory: No sensory deficit.     Motor: No weakness.     Coordination: Coordination normal.  Psychiatric:        Mood and Affect: Mood normal.        Behavior: Behavior normal.        Thought Content: Thought content normal.        Judgment: Judgment normal.      ED Results / Procedures / Treatments   Labs (all labs ordered are listed, but only abnormal results are displayed) Labs Reviewed  BASIC METABOLIC PANEL - Abnormal; Notable for the following components:      Result Value   BUN 6 (*)    Calcium 8.6 (*)  All other components within normal limits  CBC WITH DIFFERENTIAL/PLATELET - Abnormal; Notable for the following components:   WBC 3.8 (*)    All other components within normal limits  TROPONIN I (HIGH SENSITIVITY) - Abnormal; Notable for the following components:   Troponin I (High Sensitivity) 19 (*)    All other components within normal limits  MAGNESIUM  BRAIN NATRIURETIC PEPTIDE  TSH  CBG MONITORING, ED    EKG EKG Interpretation  Date/Time:  Monday February 16 2022 12:11:37 EST Ventricular Rate:  140 PR Interval:  131 QRS Duration: 72 QT Interval:  226 QTC Calculation: 345 R Axis:   9 Text Interpretation: Ectopic atrial tachycardia, unifocal Atrial premature complex Repolarization abnormality, prob rate related Confirmed by Godfrey Pick [phone]) on 02/16/2022 2:52:04 PM  Radiology DG Chest Portable 1 View  Result Date: 02/16/2022 CLINICAL DATA:  Atrial flutter EXAM: PORTABLE CHEST 1 VIEW COMPARISON:  05/18/2021 FINDINGS: Hyperinflation. No consolidation, pneumothorax or effusion. No edema. Borderline cardiopericardial silhouette. Overlapping cardiac leads. IMPRESSION: Hyperinflation without acute airspace disease. Electronically Signed   By: Jill Side M.D.   On: 02/16/2022 13:14    Procedures Procedures    Medications Ordered in ED Medications  metoprolol tartrate (LOPRESSOR) tablet 25 mg (25 mg Oral Given 02/16/22 1415)  diltiazem (CARDIZEM CD) 24 hr capsule 120 mg (120 mg Oral Given 02/16/22 1415)  diltiazem (CARDIZEM) 1 mg/mL load via infusion 15 mg (15 mg Intravenous Bolus from Bag 02/16/22 1252)  apixaban (ELIQUIS) tablet 5 mg (5 mg Oral Given 02/16/22 1254)    ED Course/ Medical Decision Making/ A&P                              Medical Decision Making Amount and/or Complexity of Data Reviewed Labs: ordered. Radiology: ordered.  Risk Prescription drug management.   This patient presents to the ED for concern of shortness of breath and palpitations, this involves an extensive number of treatment options, and is a complaint that carries with it a high risk of complications and morbidity.  The differential diagnosis includes arrhythmia, medication adherence, polypharmacy, infection, dehydration, anemia, metabolic disturbances   Co morbidities that complicate the patient evaluation  GERD, HTN, T2DM, arthritis, osteopenia, pulmonology rheumatica   Additional history obtained:  Additional history obtained from patient's daughter External records from outside source obtained and reviewed including EMR   Lab Tests:  I Ordered, and personally interpreted labs.  The pertinent results include: Normal hemoglobin, baseline leukopenia, mild hypocalcemia with otherwise normal electrolytes, normal TSH.  Troponin is at the high range of normal, expected from increased demand from her tachycardia.   Imaging Studies ordered:  I ordered imaging studies including chest x-ray I independently visualized and interpreted imaging which showed no acute findings I agree with the radiologist interpretation   Cardiac Monitoring: / EKG:  The patient was maintained on a cardiac monitor.  I personally viewed and interpreted the cardiac monitored which showed an underlying rhythm of: Initially regular rhythm tachycardia, subsequently normal sinus rhythm.  Problem List / ED Course / Critical interventions / Medication management  Patient presenting for palpitations and shortness of breath starting this morning.  Found to have rapid heart rate in the range of 150 at urgent care in [place].  EKG showed likely atrial flutter with 2 1 conduction.  Transported by EMS to the ED.  On arrival, patient reports some  improvement in her symptoms.  She does endorse continued mild palpitations and  shortness of breath that she only notices with movements.  Vital signs on arrival are notable for tachycardia and hypertension.  Patient current heart rate is in the range of 140.  EKG shows indeterminate rhythm but likely atrial flutter given regularity.  She is on metoprolol and Cardizem at home.  She did not take her doses this morning.  Medical therapy was initiated.  Will check lab work and optimize electrolytes as needed.  Patient is also on Eliquis and has been adherent to her twice daily dosing up until today.  Cardioversion will be option if medical therapy fails or patient decompensates.  Patient quickly converted to a normal sinus rhythm following initial dose of diltiazem.  She was started on a diltiazem gtt. at 5 mg/h.  She was transitioned off of her drip and given her home medications of metoprolol and diltiazem.  She remained asymptomatic with normal sinus rhythm with normal rate.  Lab work shows normal electrolytes.  Troponin was 19 which is high range of normal, however, this is expected after her persistent tachycardia throughout the day.  Patient was advised to continue home medications as prescribed and to follow-up with cardiology for review of current dosing.  She was also encouraged to return to the ED for any further episodes.  She was discharged in good condition. I ordered medication including diltiazem for rate control Reevaluation of the patient after these medicines showed that the patient resolved I have reviewed the patients home medicines and have made adjustments as needed   Social Determinants of Health:  Has access to outpatient care  CRITICAL CARE Performed by: Godfrey Pick   Total critical care time: 32 minutes  Critical care time was exclusive of separately billable procedures and treating other patients.  Critical care was necessary to treat or prevent imminent or life-threatening  deterioration.  Critical care was time spent personally by me on the following activities: development of treatment plan with patient and/or surrogate as well as nursing, discussions with consultants, evaluation of patient's response to treatment, examination of patient, obtaining history from patient or surrogate, ordering and performing treatments and interventions, ordering and review of laboratory studies, ordering and review of radiographic studies, pulse oximetry and re-evaluation of patient's condition.         Final Clinical Impression(s) / ED Diagnoses Final diagnoses:  Tachycardia  Atrial flutter, unspecified type [facility])    Rx / DC Orders ED Discharge Orders          Ordered    Amb referral to [facility]        02/16/22 1238    Ambulatory referral to Cardiology       Comments: If you have not heard from the Cardiology office within the next 72 hours please call [phone].   02/16/22 1542              Godfrey Pick, MD 02/16/22 1544

## 2022-02-16 NOTE — ED Triage Notes (Addendum)
PT BIB [facility] for atrial flutter w/RVR in the 160's initially.   HR in 130-140's on arrival. PT complains of feeling of fluttering and a little SOB.  No CP. Pt is on Eliquis bid b/c of hx of afib. She did not take her morning meds today.   EMS gave 1 SL nitro, 324 ASA and 1L NS  EMS VS 146/94, HR 130 98% RA, CBG 125

## 2022-02-16 NOTE — Discharge Instructions (Signed)
Continue to take home medications as prescribed.  If you do not hear from cardiology office in the next few days, call the number below to set up a follow-up appointment with Dr. Harl Bowie.  Return to the emergency department for any further concerning episodes.

## 2022-02-16 NOTE — Discharge Instructions (Signed)
I am concerned about your heart rate today.  We are sending you to the hospital for further evaluation and management of the elevated heart rate.

## 2022-02-16 NOTE — ED Triage Notes (Signed)
This morning pt states she started feeling SOB and her heart racing. Does have a history of afib.

## 2022-02-16 NOTE — ED Notes (Signed)
DC instructions reviewed with pt. PT verbalized understanding. PT DC °

## 2022-02-16 NOTE — ED Notes (Signed)
Patient is being discharged from the Urgent Care and sent to the Emergency Department via EMS . Per Maricela Bo., patient is in need of higher level of care due to SVT, heart flutters and SOB. Patient is aware and verbalizes understanding of plan of care.  Vitals:   02/16/22 1032  BP: (!) 154/101  Pulse: (!) 145  Resp: 16  Temp: 97.7 F (36.5 C)  SpO2: 96%

## 2022-02-20 ENCOUNTER — Encounter: Payer: Self-pay | Admitting: Diagnostic Neuroimaging

## 2022-02-20 ENCOUNTER — Ambulatory Visit (INDEPENDENT_AMBULATORY_CARE_PROVIDER_SITE_OTHER): Payer: 59 | Admitting: Diagnostic Neuroimaging

## 2022-02-20 VITALS — BP 168/93 | HR 56 | Ht 69.0 in | Wt 186.0 lb

## 2022-02-20 DIAGNOSIS — M3506 Sjogren syndrome with peripheral nervous system involvement: Secondary | ICD-10-CM

## 2022-02-20 DIAGNOSIS — G63 Polyneuropathy in diseases classified elsewhere: Secondary | ICD-10-CM

## 2022-02-20 NOTE — Patient Instructions (Signed)
  NEUROPATHY (due to sjogren's disease; positive ANA, postive SSA) - continue supportive care; hydroxychloroquine - caution with balance, walking, driving

## 2022-02-20 NOTE — Progress Notes (Signed)
[facility]  PATIENT: Angela Simmons DOB: 16-May-1945  REFERRING CLINICIAN: Binnie Rail, MD  HISTORY FROM: patient and daughter, granddaughter  REASON FOR VISIT: new consult    HISTORICAL  CHIEF COMPLAINT:  Chief Complaint  Patient presents with   New Patient (Initial Visit)    Rm 7 with daughter and granddaughter. Reports she is here to discuss bilateral foot/numbness and heaviness symptoms are present in both. Symptoms have been present for some time but has worsened.     HISTORY OF PRESENT ILLNESS:   UPDATE (02/20/22, VRP): Since last visit, doing continued numbness in toes, feet; harder to control car gas / brake pedals. Some stiffness in back, knees, ankles. Continues on hydroxychloroquine for sjogrens.   PRIOR HPI (03/01/17): 77 year old right-handed female here for evaluation of abnormal sensation in the legs.  Patient reports at least 6 months of cold sensation in her feet and legs.  Sometimes she has burning on the top of her feet.  She has some tingling sensation in her fingers.  Symptoms have been fairly stable over the last few months.  Symptoms are intermittent.  Patient reports painful sensation a few days out of the week.  She is not taking any medication for this problem.  Patient denies any neck or low back pain.  Patient has diagnosis of diabetes, which is under fairly good control.  Last hemoglobin A1c 6.4.  Patient also under treatment for autoimmune disease, with positive ANA and sicca symptoms.  Patient also has history of polymyalgia rheumatica.  Patient has been on prednisone in the past but has been tapered off.   REVIEW OF SYSTEMS: Full 14 system review of systems performed and negative with exception of: Blurred vision swelling in legs hearing loss easy bruising memory loss joint pain joint swelling aching muscles decreased energy runny nose skin sensitivity itching chills weight gain fatigue.   ALLERGIES: Allergies  Allergen  Reactions   Levofloxacin Hives and Itching   Nitrofurantoin Hives and Itching   Penicillins Hives and Itching   Sulfonamide Derivatives Itching   Pantoprazole Other (See Comments)    Bad dreams    HOME MEDICATIONS: Outpatient Medications Prior to Visit  Medication Sig Dispense Refill   apixaban (ELIQUIS) 5 MG TABS tablet Take 1 tablet by mouth twice daily 60 tablet 5   atorvastatin (LIPITOR) 10 MG tablet Take 1 tablet by mouth daily 180 tablet 0   benazepril (LOTENSIN) 40 MG tablet Take 1 tablet (40 mg total) by mouth daily. 90 tablet 0   Blood Glucose Monitoring Suppl (ONETOUCH VERIO) w/Device KIT USE TO TEST BLOOD SUGAR ONCE DAILY AS DIRECTED 1 kit 0   clotrimazole-betamethasone (LOTRISONE) cream Apply topically.     diclofenac sodium (VOLTAREN) 1 % GEL Apply 3 g to 3 large joints up to 3 times daily. 3 Tube 3   DILT-XR 120 MG 24 hr capsule TAKE 1 CAPSULE BY MOUTH EVERY DAY 90 capsule 3   fluticasone (FLONASE) 50 MCG/ACT nasal spray Place 1 spray into both nostrils as needed for allergies or rhinitis.     glucose blood test strip USE TO TEST BLOOD SUGAR ONCE DAILY. E11.40 100 each 3   hydroxychloroquine (PLAQUENIL) 200 MG tablet Take 1 tablet by mouth once daily 90 tablet 0   loratadine (CLARITIN) 10 MG tablet TAKE 1 TABLET BY MOUTH EVERY DAY 30 tablet 2   metoprolol tartrate (LOPRESSOR) 25 MG tablet Take 1 tablet (25 mg total) by mouth 2 (two) times daily. 60 tablet 6  Multiple Vitamins-Minerals (CENTRUM SILVER 50+WOMEN) TABS Take 1 tablet by mouth daily at 6 (six) AM.     Omega-3 Fatty Acids (FISH OIL) 1000 MG CAPS Take 1,000 mg by mouth daily.     omeprazole (PRILOSEC) 40 MG capsule Take 1 capsule by mouth once daily 90 capsule 0   ONETOUCH DELICA LANCETS 42V MISC USE TO TEST ONCE DAILY 100 each 2   ONETOUCH VERIO test strip USE TO TEST BLOOD SUGAR ONCE DAILY 100 each 0   Turmeric (QC TUMERIC COMPLEX) 500 MG CAPS Take 1 capsule by mouth daily.     nitroGLYCERIN (NITROSTAT) 0.4 MG  SL tablet Place 1 tablet (0.4 mg total) under the tongue every 5 (five) minutes as needed for chest pain. (Patient not taking: Reported on 02/20/2022) 25 tablet 3   No facility-administered medications prior to visit.    PAST MEDICAL HISTORY: Past Medical History:  Diagnosis Date   Breast cancer ([place]) 1990s   right breast   Collagen vascular disease ([place])    Diabetes mellitus without complication ([place])    GERD (gastroesophageal reflux disease)    HTN (hypertension)    Hypercholesterolemia    Obesity    Osteoarthritis     PAST SURGICAL HISTORY: Past Surgical History:  Procedure Laterality Date   ABDOMINAL HYSTERECTOMY  1993   [place], fibroids   AORTIC ARCH ANGIOGRAPHY N/A 12/07/2019   Procedure: AORTIC ARCH ANGIOGRAPHY;  Surgeon: Lorretta Harp, MD;  Location: [facility];  Service: Cardiovascular;  Laterality: N/A;   COLONOSCOPY  May 2003   Dr. Tamala Julian: normal   COLONOSCOPY  06/22/2011   Procedure: COLONOSCOPY;  Surgeon: Danie Binder, MD;  [facility];  Service: Endoscopy;  Laterality: N/A;  10:30   LEFT HEART CATH AND CORONARY ANGIOGRAPHY N/A 12/07/2019   Procedure: LEFT HEART CATH AND CORONARY ANGIOGRAPHY;  Surgeon: Lorretta Harp, MD;  Location: [facility];  Service: Cardiovascular;  Laterality: N/A;   MASTECTOMY  1995   right     FAMILY HISTORY: Family History  Problem Relation Age of Onset   Diabetes Mother        diabetic coma   Heart failure Mother    Diabetes Father    Emphysema Father    Diabetes Sister    Hypercholesterolemia Sister    Hypercholesterolemia Brother    Diabetes Brother    Hypertension Brother    Hypercholesterolemia Brother    Diabetes Sister    Hypertension Sister    Hypercholesterolemia Sister    Hypercholesterolemia Sister    Hypercholesterolemia Sister    Diabetes Brother    Hypertension Brother    Colon cancer Neg Hx     SOCIAL HISTORY:  Social History   Socioeconomic History   Marital status:  Widowed    Spouse name: Not on file   Number of children: 3   Years of education: 7   Highest education level: Not on file  Occupational History   Not on file  Tobacco Use   Smoking status: Never    Passive exposure: Never   Smokeless tobacco: Never  Vaping Use   Vaping Use: Never used  Substance and Sexual Activity   Alcohol use: No   Drug use: Never   Sexual activity: Never    Birth control/protection: Rhythm  Other Topics Concern   Not on file  Social History Narrative   Lives at home with husband   Caffeine, maybe 1 a day   Social Determinants of Health  Financial Resource Strain: Low Risk  (08/31/2019)   Overall Financial Resource Strain (CARDIA)    Difficulty of Paying Living Expenses: Not very hard  Food Insecurity: Not on file  Transportation Needs: Not on file  Physical Activity: Not on file  Stress: Not on file  Social Connections: Not on file  Intimate Partner Violence: Not on file     PHYSICAL EXAM  GENERAL EXAM/CONSTITUTIONAL: Vitals:  Vitals:   02/20/22 1120  BP: (!) 168/93  Pulse: (!) 56  Weight: 186 lb (84.4 kg)  Height: '5\' 9"'$  (1.753 m)   Body mass index is 27.47 kg/m. No results found. Patient is in no distress; well developed, nourished and groomed; neck is supple  CARDIOVASCULAR: Examination of carotid arteries is normal; no carotid bruits Regular rate and rhythm, no murmurs Examination of peripheral vascular system (DP PALPABLE AND SYMM) by observation and palpation is normal  EYES: Ophthalmoscopic exam of optic discs and posterior segments is normal; no papilledema or hemorrhages  MUSCULOSKELETAL: Gait, strength, tone, movements noted in Neurologic exam below  NEUROLOGIC: MENTAL STATUS:     02/25/2017    5:02 PM 02/21/2016   10:35 AM  MMSE - Mini Mental State Exam  Orientation to time 5 5  Orientation to Place 5 5  Registration 3 3  Attention/ Calculation 4 0  Attention/Calculation-comments  patient states she cannot spell  or count  Recall 2 3  Language- name 2 objects 2 2  Language- repeat 1 1  Language- follow 3 step command 3 3  Language- read & follow direction 1 1  Write a sentence 1 1  Copy design 1 0  Total score 28 24   awake, alert, oriented to person, place and time recent and remote memory intact normal attention and concentration language fluent, comprehension intact, naming intact,  fund of knowledge appropriate  CRANIAL NERVE:  2nd - no papilledema on fundoscopic exam 2nd, 3rd, 4th, 6th - pupils equal and reactive to light, visual fields full to confrontation, extraocular muscles intact, no nystagmus 5th - facial sensation symmetric 7th - facial strength symmetric 8th - hearing intact 9th - palate elevates symmetrically, uvula midline 11th - shoulder shrug symmetric 12th - tongue protrusion midline  MOTOR:  normal bulk and tone, full strength in the BUE, BLE; EXCEPT BILATERAL HIP FLEXION 4+  SENSORY:  normal and symmetric to light touch, pinprick, temperature, vibration EXCEPT DECR PP, VIB AT TOES; DECR PP IN GRADIENT UP TO SHINS  COORDINATION:  finger-nose-finger, fine finger movements normal  REFLEXES:  deep tendon reflexes --> BUE 1, KNEES TRACE, ANKLES ABSENT and symmetric  GAIT/STATION:  narrow based gait    DIAGNOSTIC DATA (LABS, IMAGING, TESTING) - I reviewed patient records, labs, notes, testing and imaging myself where available.  Lab Results  Component Value Date   WBC 3.8 (L) 02/16/2022   HGB 12.4 02/16/2022   HCT 37.0 02/16/2022   MCV 93.2 02/16/2022   PLT 180 02/16/2022      Component Value Date/Time   NA 139 02/16/2022 1238   NA 141 10/11/2015 0000   K 4.0 02/16/2022 1238   CL 105 02/16/2022 1238   CO2 23 02/16/2022 1238   GLUCOSE 98 02/16/2022 1238   BUN 6 (L) 02/16/2022 1238   BUN 13 10/11/2015 0000   CREATININE 0.83 02/16/2022 1238   CREATININE 1.02 (H) 07/29/2021 1025   CALCIUM 8.6 (L) 02/16/2022 1238   PROT 7.6 01/05/2022 1125    ALBUMIN 4.1 01/05/2022 1125   AST 17 01/05/2022  1125   ALT 14 01/05/2022 1125   ALKPHOS 63 01/05/2022 1125   BILITOT 0.6 01/05/2022 1125   GFRNONAA >60 02/16/2022 1238   GFRNONAA 62 04/23/2020 1144   GFRAA 72 04/23/2020 1144   Lab Results  Component Value Date   CHOL 138 01/05/2022   HDL 51.00 01/05/2022   LDLCALC 78 01/05/2022   TRIG 43.0 01/05/2022   CHOLHDL 3 01/05/2022   Lab Results  Component Value Date   HGBA1C 6.2 01/05/2022   Lab Results  Component Value Date   MBEMLJQG92 010 11/26/2017   Lab Results  Component Value Date   TSH 1.710 02/16/2022   03/01/17 Component Ref Range & Units 4 yr ago  ENA SSA (RO) Ab 0.0 - 0.9 AI 4.6 High   ENA SSB (LA) Ab 0.0 - 0.9 AI <0.2        Component Ref Range & Units 6 mo ago 1 yr ago  SSA (Ro) (ENA) Antibody, IgG <1.0 NEG AI 6.5 POS Abnormal  7.3 POS Abnormal      Component Ref Range & Units 6 mo ago 1 yr ago  ANA Titer 1 titer 1:320 High  1:640 High  CM  Comment:                 Reference Range                 <1:40        Negative                 1:40-1:80    Low Antibody Level                 >1:80        Elevated Antibody Level .  ANA Pattern 1  Nuclear, Speckled Abnormal  Nuclear, Speckled Abnormal    03/25/11 MRI lumbar spine [I reviewed images myself and agree with interpretation. -VRP]  1.  L3-L4 moderate bilateral facet arthrosis with mild central stenosis associated with facet hypertrophy and ligamentum flavum redundancy. 2.  Left eccentric broad-based L4-L5 disc bulge.  Left greater than right lateral recess stenosis and mild to moderate left foraminal stenosis potentially affecting L4 and L5 nerves. 3.  Moderate bilateral foraminal stenosis at L5-S1 associated with broad-based posterior disc bulging and endplate osteophytes.    ASSESSMENT AND PLAN  77 y.o. year old female here with 6-12 months of lower extremity cold sensation, burning sensation, numbness, with neurologic exam notable for decreased sensation  in the feet and gradient up to shins, decreased reflexes lower extremity, consistent with peripheral neuropathy (could be related to underlying diabetes or autoimmune disease).   Dx: neuropathy (diabetic, autoimmune) vs lumbar spinal stenosis   1. Neuropathy due to Sjogren's syndrome (HCC)     PLAN:  NEUROPATHY (due to sjogren's disease; positive ANA, postive SSA) - continue supportive care; hydroxychloroquine - caution with balance, walking, driving  Return for return to PCP, pending if symptoms worsen or fail to improve.    Penni Bombard, MD 0/07/1217, 75:88 AM Certified in Neurology, Neurophysiology and Neuroimaging  [facility] [address], [place], [place] [address] [phone]

## 2022-02-21 ENCOUNTER — Encounter: Payer: Self-pay | Admitting: Internal Medicine

## 2022-02-21 DIAGNOSIS — G629 Polyneuropathy, unspecified: Secondary | ICD-10-CM | POA: Insufficient documentation

## 2022-02-22 ENCOUNTER — Other Ambulatory Visit: Payer: Self-pay | Admitting: Cardiology

## 2022-02-23 ENCOUNTER — Encounter: Payer: Self-pay | Admitting: Internal Medicine

## 2022-02-24 ENCOUNTER — Telehealth: Payer: Self-pay

## 2022-02-24 MED ORDER — VALACYCLOVIR HCL 1 G PO TABS: ORAL_TABLET | ORAL | 3 refills | Status: DC

## 2022-02-24 NOTE — Telephone Encounter (Signed)
        Patient  visited [facility] on 1/29   Telephone encounter attempt :  1st  Unable to [place], [place] [phone] [address], [place], [place] [address] Phone: [phone] Email: [email or web address]

## 2022-02-25 ENCOUNTER — Telehealth: Payer: Self-pay

## 2022-02-25 NOTE — Telephone Encounter (Signed)
        Patient  visited [facility] on 1/29    Telephone encounter attempt :  2nd   Unable to leave a message     [place], [place] [phone] [address], [place], [place] [address] Phone: [phone] Email: [email or web address]

## 2022-03-04 NOTE — Progress Notes (Unsigned)
GI Office Note    Referring Provider: Binnie Rail, MD Primary Care Physician:  Angela Rail, MD  Primary Gastroenterologist: Angela Simmons.Rourk, MD, previously Dr. Oneida Simmons  Chief Complaint   Chief Complaint  Patient presents with   Colonoscopy    Colonoscopy screening   History of Present Illness   Angela Simmons is a 77 y.o. female presenting today at the request of Angela Simmons, Angela Lick, MD for evaluation prior to scheduling colonoscopy.  Last colonoscopy 06/22/11: -sigmoid diverticulosis -moderate internal hemorrhoids -bentyl for diarrhea or abdominal pain -repeat colonoscopy in 10 years  Recent ED visit 02/16/22 for tachycardia with HR 140-160s and EKG revealing Aflutter. Was treated with fluids, nitro, ASA, cardizem. Labs normal. Referred to cardiology/Afib clinic.   Per review of chart it appears she has an appointment with cardiology on 03/13/2022  Today: Daughter Angela Simmons present for visit.  Has an occasional stomach ache in the mornings.  Mid abdominal pain in the mornings when she gets up. The other week she had some nausea after having a fever blister on her lips. Does not occur on a regular basis.   Has a BM mostly everyday, sometimes twice per day. Stools are soft. Does have to strain at times. Takes senna as needed.   Has a good appetite, unintentional weight loss, early satiety, dysphagia. Denies melena or brbpr.   GERD controlled on omeprazole.   Reports some shortness of breath related to her Afib. Take diltiazem at home and Eliquis. Denies any bleeding episodes. Blood glucose has not been over 160. Reports her BP is always high at doctor office. At home her BP was 132/77.    Current Outpatient Medications  Medication Sig Dispense Refill   apixaban (ELIQUIS) 5 MG TABS tablet Take 1 tablet by mouth twice daily 60 tablet 5   atorvastatin (LIPITOR) 10 MG tablet Take 1 tablet by mouth daily 180 tablet 0   benazepril (LOTENSIN) 40 MG tablet Take 1 tablet (40 mg total)  by mouth daily. 90 tablet 0   Blood Glucose Monitoring Suppl (ONETOUCH VERIO) w/Device KIT USE TO TEST BLOOD SUGAR ONCE DAILY AS DIRECTED 1 kit 0   clotrimazole-betamethasone (LOTRISONE) cream Apply topically.     diclofenac sodium (VOLTAREN) 1 % GEL Apply 3 g to 3 large joints up to 3 times daily. 3 Tube 3   DILT-XR 120 MG 24 hr capsule TAKE 1 CAPSULE BY MOUTH EVERY DAY 90 capsule 3   fluticasone (FLONASE) 50 MCG/ACT nasal spray Place 1 spray into both nostrils as needed for allergies or rhinitis.     glucose blood test strip USE TO TEST BLOOD SUGAR ONCE DAILY. E11.40 100 each 3   hydroxychloroquine (PLAQUENIL) 200 MG tablet Take 1 tablet by mouth once daily 90 tablet 0   loratadine (CLARITIN) 10 MG tablet TAKE 1 TABLET BY MOUTH EVERY DAY 30 tablet 2   metoprolol tartrate (LOPRESSOR) 25 MG tablet Take 1 tablet by mouth twice daily 180 tablet 3   Multiple Vitamins-Minerals (CENTRUM SILVER 50+WOMEN) TABS Take 1 tablet by mouth daily at 6 (six) AM.     Omega-3 Fatty Acids (FISH OIL) 1000 MG CAPS Take 1,000 mg by mouth daily.     omeprazole (PRILOSEC) 40 MG capsule Take 1 capsule by mouth once daily 90 capsule 0   ONETOUCH DELICA LANCETS 99991111 MISC USE TO TEST ONCE DAILY 100 each 2   ONETOUCH VERIO test strip USE TO TEST BLOOD SUGAR ONCE DAILY 100 each 0   nitroGLYCERIN (  NITROSTAT) 0.4 MG SL tablet Place 1 tablet (0.4 mg total) under the tongue every 5 (five) minutes as needed for chest pain. (Patient not taking: Reported on 02/20/2022) 25 tablet 3   Turmeric (QC TUMERIC COMPLEX) 500 MG CAPS Take 1 capsule by mouth daily. (Patient not taking: Reported on 03/05/2022)     valACYclovir (VALTREX) 1000 MG tablet Take 2 tabs PO Q12 hours x 1 day for cold sore (Patient not taking: Reported on 03/05/2022) 12 tablet 3   No current facility-administered medications for this visit.    Past Medical History:  Diagnosis Date   Breast cancer [facility]) 1990s   right breast   Collagen vascular disease ([place])    Diabetes  mellitus without complication ([place])    GERD (gastroesophageal reflux disease)    HTN (hypertension)    Hypercholesterolemia    Obesity    Osteoarthritis     Past Surgical History:  Procedure Laterality Date   ABDOMINAL HYSTERECTOMY  1993   [place], fibroids   AORTIC ARCH ANGIOGRAPHY N/A 12/07/2019   Procedure: AORTIC ARCH ANGIOGRAPHY;  Surgeon: Angela Harp, MD;  Location: [facility];  Service: Cardiovascular;  Laterality: N/A;   COLONOSCOPY  May 2003   Dr. Tamala Julian: normal   COLONOSCOPY  06/22/2011   Procedure: COLONOSCOPY;  Surgeon: Angela Binder, MD;  [facility];  Service: Endoscopy;  Laterality: N/A;  10:30   LEFT HEART CATH AND CORONARY ANGIOGRAPHY N/A 12/07/2019   Procedure: LEFT HEART CATH AND CORONARY ANGIOGRAPHY;  Surgeon: Angela Harp, MD;  Location: [facility];  Service: Cardiovascular;  Laterality: N/A;   MASTECTOMY  1995   right     Family History  Problem Relation Age of Onset   Diabetes Mother        diabetic coma   Heart failure Mother    Diabetes Father    Emphysema Father    Diabetes Sister    Hypercholesterolemia Sister    Hypercholesterolemia Brother    Diabetes Brother    Hypertension Brother    Hypercholesterolemia Brother    Diabetes Sister    Hypertension Sister    Hypercholesterolemia Sister    Hypercholesterolemia Sister    Hypercholesterolemia Sister    Diabetes Brother    Hypertension Brother    Colon cancer Neg Hx     Allergies as of 03/05/2022 - Review Complete 03/05/2022  Allergen Reaction Noted   Levofloxacin Hives and Itching 02/08/2007   Nitrofurantoin Hives and Itching 02/08/2007   Penicillins Hives and Itching 02/08/2007   Sulfonamide derivatives Itching 02/08/2007   Pantoprazole Other (See Comments) 11/05/2017    Social History   Socioeconomic History   Marital status: Widowed    Spouse name: Not on file   Number of children: 3   Years of education: 7   Highest education level: Not on  file  Occupational History   Not on file  Tobacco Use   Smoking status: Never    Passive exposure: Never   Smokeless tobacco: Never  Vaping Use   Vaping Use: Never used  Substance and Sexual Activity   Alcohol use: No   Drug use: Never   Sexual activity: Never    Birth control/protection: Rhythm  Other Topics Concern   Not on file  Social History Narrative   Lives at home with husband   Caffeine, maybe 1 a day   Social Determinants of Health   Financial Resource Strain: Low Risk  (08/31/2019)   Overall Financial Resource  Strain (CARDIA)    Difficulty of Paying Living Expenses: Not very hard  Food Insecurity: Not on file  Transportation Needs: Not on file  Physical Activity: Not on file  Stress: Not on file  Social Connections: Not on file  Intimate Partner Violence: Not on file     Review of Systems   Gen: Denies any fever, chills, fatigue, weight loss, lack of appetite.  CV: Denies chest pain, heart palpitations, peripheral edema, syncope.  Resp: Denies shortness of breath at rest or with exertion. Denies wheezing or cough.  GI: see HPI GU : Denies urinary burning, urinary frequency, urinary hesitancy MS: Denies joint pain, muscle weakness, cramps, or limitation of movement.  Derm: Denies rash, itching, dry skin Psych: Denies depression, anxiety, memory loss, and confusion Heme: Denies bruising, bleeding, and enlarged lymph nodes.   Physical Exam   BP (!) 189/76 (BP Location: Right Arm, Patient Position: Sitting, Cuff Size: Normal)   Pulse (!) 56   Temp 97.6 F (36.4 C) (Temporal)   Ht 5' 10.8" (1.798 m)   Wt 186 lb 3.2 oz (84.5 kg)   SpO2 99%   BMI 26.12 kg/m   General:   Alert and oriented. Pleasant and cooperative. Well-nourished and well-developed.  Head:  Normocephalic and atraumatic. Eyes:  Without icterus, sclera clear and conjunctiva pink.  Ears:  Normal auditory acuity. Mouth:  No deformity or lesions, oral mucosa pink.  Lungs:  Clear to  auscultation bilaterally. No wheezes, rales, or rhonchi. No distress.  Heart:  S1, S2 present without murmurs appreciated.  Abdomen:  +BS, soft, non-tender and non-distended. No HSM noted. No guarding or rebound. No masses appreciated.  Rectal:  Deferred Msk:  Symmetrical without gross deformities. Normal posture. Extremities:  Without edema. Neurologic:  Alert and  oriented x4;  grossly normal neurologically. Skin:  Intact without significant lesions or rashes. Psych:  Alert and cooperative. Normal mood and affect.   Assessment   KENETHA GILLERAN is a 77 y.o. female with a history of breast cancer, diabetes, GERD, HTN, HLD, osteoarthritis, collagen vascular disease, pulmonology rheumatica, afib/aflutter on Eliquis presenting today for evaluation prior to scheduling screening colonoscopy.   Screening colon cancer: Denies any melena, BRBPR, lack of appetite, early satiety, unintentional weight loss.  No alarm symptoms present.  Typically with a soft bowel movement every day.  GERD well-controlled as stated below.  Last colonoscopy in 2013 with sigmoid diverticulosis and moderate hemorrhoids.  Currently overdue for surveillance.  Given her recent A-fib exacerbation we will request clearance from cardiology to undergo procedure as well as to hold Eliquis for 2 days prior.  Patient and daughter in agreement.  Will reach out to schedule once clearance received.  GERD: Fairly well controlled on omeprazole 40 mg daily.  Occasional nausea/vomiting or abdominal discomfort likely secondary to early morning reflux.  Patient's initial BP 189/76.  No alarm symptoms present.  She states it is always high when she comes to the doctor's office.  Reported her blood pressure to be 132/77 this morning at home.  PLAN   Proceed with colonoscopy with propofol by Dr. Gala Romney  in near future: the risks, benefits, and alternatives have been discussed with the patient in detail. The patient states understanding and  desires to proceed. ASA 3 Need pre op clearance from cardiology and clearance to hold eliquis for 2 days prior (appt 03/13/22) If BP remains elevated greater than 140/90 please follow-up with your primary care. Keep appointment with cardiology next week. Continue omeprazole 40 mg  once daily GERD diet/lifestyle modifications   Angela Night, Angela Simmons, Angela Simmons, Angela Simmons [facility]

## 2022-03-05 ENCOUNTER — Ambulatory Visit (INDEPENDENT_AMBULATORY_CARE_PROVIDER_SITE_OTHER): Payer: 59 | Admitting: Gastroenterology

## 2022-03-05 ENCOUNTER — Telehealth: Payer: Self-pay | Admitting: *Deleted

## 2022-03-05 ENCOUNTER — Encounter: Payer: Self-pay | Admitting: Gastroenterology

## 2022-03-05 VITALS — BP 179/80 | HR 56 | Temp 97.6°F | Ht 70.8 in | Wt 186.2 lb

## 2022-03-05 DIAGNOSIS — K219 Gastro-esophageal reflux disease without esophagitis: Secondary | ICD-10-CM

## 2022-03-05 DIAGNOSIS — Z1211 Encounter for screening for malignant neoplasm of colon: Secondary | ICD-10-CM

## 2022-03-05 NOTE — Telephone Encounter (Signed)
Patient with diagnosis of aflutter on Eliquis for anticoagulation.    Procedure: colonoscopy Date of procedure: TBD  CHA2DS2-VASc Score = 6  This indicates a 9.7% annual risk of stroke. The patient's score is based upon: CHF History: 0 HTN History: 1 Diabetes History: 1 Stroke History: 0 Vascular Disease History: 1 Age Score: 2 Gender Score: 1  ED 02/16/22 - "EKG shows indeterminate rhythm but likely atrial flutter given regularity. Patient quickly converted to a normal sinus rhythm following initial dose of diltiazem."   CrCl 84m/min Platelet count 180K  Per office protocol, patient can hold Eliquis for 2 days prior to procedure as requested. Out of caution, would schedule 2/29 at the earliest to give pt 4 weeks on uninterrupted anticoag after she recently converted from aflutter to NSR in the ED.    **This guidance is not considered finalized until pre-operative APP has relayed final recommendations.**

## 2022-03-05 NOTE — Telephone Encounter (Signed)
  Request for patient to stop medication prior to procedure or is needing cleareance  03/05/22  ROLAND PRINE 1945/02/19  What type of surgery is being performed? Colonoscopy  When is surgery scheduled? TBD  What type of clearance is required (medical or pharmacy to hold medication or both? Medical and to hold medication  Are there any medications that need to be held prior to surgery and how long? Eliquis x 2 days  Name of physician performing surgery?  [facility] [employer]: [phone] Fax: [phone]  Anethesia type (none, local, MAC, general)? MAC

## 2022-03-05 NOTE — Patient Instructions (Addendum)
We will plan to get you scheduled for colonoscopy in the near future with Dr. Gala Romney pending receivable of clearance from cardiology given your recent episode of A-fib.  Will also request clearance from them to hold your Eliquis for 2 days prior to your colonoscopy.  Please continue to monitor your blood pressure.  If it remains elevated greater than 140/90 at home on a consistent basis you should contact your primary care provider.  Continue omeprazole 40 mg once daily, 30 minutes prior to breakfast. Follow a GERD diet:  Avoid fried, fatty, greasy, spicy, citrus foods. Avoid caffeine and carbonated beverages. Avoid chocolate. Try eating 4-6 small meals a day rather than 3 large meals. Do not eat within 3 hours of laying down. Prop head of bed up on wood or bricks to create a 6 inch incline.  It was a pleasure to see you today! I want to create trusting relationships with patients. If you receive a survey regarding your visit,  I greatly appreciate you taking time to fill this out on paper or through your MyChart. I value your feedback.  Venetia Night, MSN, FNP-BC, AGACNP-BC [facility]

## 2022-03-06 NOTE — Telephone Encounter (Signed)
   Name: Angela Simmons  DOB: [birth year]  MRN: [id number]  Primary Cardiologist: Carlyle Dolly, MD   Preoperative team, please contact this patient and set up a phone call appointment for further preoperative risk assessment. Please obtain consent and complete medication review. Thank you for your help.  I confirm that guidance regarding antiplatelet and oral anticoagulation therapy has been completed and, if necessary, noted below.  Per pharm D: Procedure: colonoscopy Date of procedure: TBD   CHA2DS2-VASc Score = 6  This indicates a 9.7% annual risk of stroke. The patient's score is based upon: CHF History: 0 HTN History: 1 Diabetes History: 1 Stroke History: 0 Vascular Disease History: 1 Age Score: 2 Gender Score: 1   ED 02/16/22 - "EKG shows indeterminate rhythm but likely atrial flutter given regularity. Patient quickly converted to a normal sinus rhythm following initial dose of diltiazem."    CrCl 49m/min Platelet count 180K   Per office protocol, patient can hold Eliquis for 2 days prior to procedure as requested. Out of caution, would schedule 2/29 at the earliest to give pt 4 weeks on uninterrupted anticoag after she recently converted from aflutter to NSR in the ED.       DMayra Reel NP 03/06/2022, 7:40 AM [place]

## 2022-03-06 NOTE — Telephone Encounter (Signed)
Pt has appt 03/13/22 with Dr. Harl Bowie. I will forward notes to MD pre op clearance needed...  I will update all parties involved.

## 2022-03-10 ENCOUNTER — Telehealth: Payer: Self-pay | Admitting: *Deleted

## 2022-03-10 DIAGNOSIS — M3501 Sicca syndrome with keratoconjunctivitis: Secondary | ICD-10-CM

## 2022-03-10 DIAGNOSIS — M353 Polymyalgia rheumatica: Secondary | ICD-10-CM

## 2022-03-10 DIAGNOSIS — Z79899 Other long term (current) drug therapy: Secondary | ICD-10-CM

## 2022-03-10 NOTE — Telephone Encounter (Signed)
Patient advised Dr. Estanislado Pandy would like for her to come to the office to have some labs drawn. Patient states she will come to the office in the morning. Future orders placed.

## 2022-03-10 NOTE — Telephone Encounter (Signed)
Patient states she is having weakness is bilateral lower extremities. Patient states it started last night and has continued through today. Patient states it is interfering with activities. Patient states she has been resting but is not sure that it helps the weakness. Patient denies any other symptoms. Patient denies any recent falls or injuries. Patient  is on PLQ and taking it as prescribed. Please advise.

## 2022-03-10 NOTE — Addendum Note (Signed)
Addended by: Carole Binning on: 03/10/2022 04:18 PM   Modules accepted: Orders

## 2022-03-10 NOTE — Telephone Encounter (Signed)
Please have patient come in to get sed rate and CK.

## 2022-03-11 ENCOUNTER — Other Ambulatory Visit: Payer: Self-pay | Admitting: *Deleted

## 2022-03-11 DIAGNOSIS — Z79899 Other long term (current) drug therapy: Secondary | ICD-10-CM

## 2022-03-11 DIAGNOSIS — M353 Polymyalgia rheumatica: Secondary | ICD-10-CM

## 2022-03-11 DIAGNOSIS — M3501 Sicca syndrome with keratoconjunctivitis: Secondary | ICD-10-CM

## 2022-03-12 LAB — SEDIMENTATION RATE: Sed Rate: 43 mm/h — ABNORMAL HIGH (ref 0–30)

## 2022-03-12 LAB — CK: Total CK: 102 U/L (ref 29–143)

## 2022-03-12 NOTE — Progress Notes (Signed)
CK is normal.  Sed rate is mildly elevated but is stable.

## 2022-03-13 ENCOUNTER — Encounter: Payer: Self-pay | Admitting: Cardiology

## 2022-03-13 ENCOUNTER — Ambulatory Visit: Payer: 59 | Attending: Cardiology | Admitting: Cardiology

## 2022-03-13 VITALS — BP 150/90 | HR 56 | Ht 69.0 in | Wt 182.4 lb

## 2022-03-13 DIAGNOSIS — E782 Mixed hyperlipidemia: Secondary | ICD-10-CM | POA: Diagnosis not present

## 2022-03-13 DIAGNOSIS — I10 Essential (primary) hypertension: Secondary | ICD-10-CM

## 2022-03-13 DIAGNOSIS — I4892 Unspecified atrial flutter: Secondary | ICD-10-CM

## 2022-03-13 DIAGNOSIS — Z01810 Encounter for preprocedural cardiovascular examination: Secondary | ICD-10-CM | POA: Diagnosis not present

## 2022-03-13 MED ORDER — METOPROLOL TARTRATE 25 MG PO TABS: 25.0000 mg | ORAL_TABLET | Freq: Two times a day (BID) | ORAL | 3 refills | Status: DC

## 2022-03-13 NOTE — Patient Instructions (Addendum)
Medication Instructions:  Continue Metoprolol '25mg'$  twice a day.  May take an additional '25mg'$  as needed for palpitations.  Continue all other medications.     Labwork: none  Testing/Procedures: none  Follow-Up: 6 months   Any Other Special Instructions Will Be Listed Below (If Applicable).   If you need a refill on your cardiac medications before your next appointment, please call your pharmacy.

## 2022-03-13 NOTE — Progress Notes (Signed)
Clinical Summary Angela Simmons is a 77 y.o.female seen today for follow up of the following medical problems.      1. Aflutter/ Atach - noted during 07/2019 admission. Started on lopressor and eliquis at that time.  - - seen in ER 08/23/19 with palpitations, SOB. EKG at the stime shows NSR   -higher doses of dilt caused dizziness, fatigue.    06/2020 monitor showed infrequent short runs of SVT.  -no palpitations - compliant with meds. No bleeding on eliquis     -seen in ER 02/16/22 with aflutter with RVR - converted to SR with IV diltiazem in ER, discharged from ER - feels like she missed a few days of her meds.  - no bleeding on elquis.    2. SOB 03/2021 echo: LVEF 55-60%, no WMAs, indet diastolic. Normal RV, PASP 44 No recent symptoms.    3. History of chest pain - cardiac catheterization in 11/2019 showed a normal left system as outlined above but the RCA was unable to be engaged. Medical therapy was recommended unless she had refractory symptoms - very infrequent symptoms   - denies any recent symptoms.      4. HTN - home bp's 120s-130s/70s-80s - has had some white coat HTN   5. Hyperlipdiemia - she is on atorvastatin '10mg'$    06/2021 TC 126 TG 39 HDL 52 LDL 66 12/2021 TC 138 TG 43 HDL 51 LDL 78    6.Preoperative evaluation - plans for colonscopy    Past Medical History:  Diagnosis Date   Breast cancer ([place]) 1990s   right breast   Collagen vascular disease ([place])    Diabetes mellitus without complication (HCC)    GERD (gastroesophageal reflux disease)    HTN (hypertension)    Hypercholesterolemia    Obesity    Osteoarthritis      Allergies  Allergen Reactions   Levofloxacin Hives and Itching   Nitrofurantoin Hives and Itching   Penicillins Hives and Itching   Sulfonamide Derivatives Itching   Pantoprazole Other (See Comments)    Bad dreams     Current Outpatient Medications  Medication Sig Dispense Refill   apixaban (ELIQUIS) 5 MG TABS  tablet Take 1 tablet by mouth twice daily 60 tablet 5   atorvastatin (LIPITOR) 10 MG tablet Take 1 tablet by mouth daily 180 tablet 0   benazepril (LOTENSIN) 40 MG tablet Take 1 tablet (40 mg total) by mouth daily. 90 tablet 0   Blood Glucose Monitoring Suppl (ONETOUCH VERIO) w/Device KIT USE TO TEST BLOOD SUGAR ONCE DAILY AS DIRECTED 1 kit 0   clotrimazole-betamethasone (LOTRISONE) cream Apply topically.     diclofenac sodium (VOLTAREN) 1 % GEL Apply 3 g to 3 large joints up to 3 times daily. 3 Tube 3   DILT-XR 120 MG 24 hr capsule TAKE 1 CAPSULE BY MOUTH EVERY DAY 90 capsule 3   fluticasone (FLONASE) 50 MCG/ACT nasal spray Place 1 spray into both nostrils as needed for allergies or rhinitis.     glucose blood test strip USE TO TEST BLOOD SUGAR ONCE DAILY. E11.40 100 each 3   hydroxychloroquine (PLAQUENIL) 200 MG tablet Take 1 tablet by mouth once daily 90 tablet 0   loratadine (CLARITIN) 10 MG tablet TAKE 1 TABLET BY MOUTH EVERY DAY 30 tablet 2   metoprolol tartrate (LOPRESSOR) 25 MG tablet Take 1 tablet by mouth twice daily 180 tablet 3   Multiple Vitamins-Minerals (CENTRUM SILVER 50+WOMEN) TABS Take 1 tablet by mouth daily  at 6 (six) AM.     Omega-3 Fatty Acids (FISH OIL) 1000 MG CAPS Take 1,000 mg by mouth daily.     omeprazole (PRILOSEC) 40 MG capsule Take 1 capsule by mouth once daily 90 capsule 0   ONETOUCH DELICA LANCETS 99991111 MISC USE TO TEST ONCE DAILY 100 each 2   ONETOUCH VERIO test strip USE TO TEST BLOOD SUGAR ONCE DAILY 100 each 0   No current facility-administered medications for this visit.     Past Surgical History:  Procedure Laterality Date   ABDOMINAL HYSTERECTOMY  1993   [place], fibroids   AORTIC ARCH ANGIOGRAPHY N/A 12/07/2019   Procedure: AORTIC ARCH ANGIOGRAPHY;  Surgeon: Lorretta Harp, MD;  Location: [facility];  Service: Cardiovascular;  Laterality: N/A;   COLONOSCOPY  May 2003   Dr. Tamala Julian: normal   COLONOSCOPY  06/22/2011   Procedure: COLONOSCOPY;   Surgeon: Danie Binder, MD;  [facility];  Service: Endoscopy;  Laterality: N/A;  10:30   LEFT HEART CATH AND CORONARY ANGIOGRAPHY N/A 12/07/2019   Procedure: LEFT HEART CATH AND CORONARY ANGIOGRAPHY;  Surgeon: Lorretta Harp, MD;  Location: [facility];  Service: Cardiovascular;  Laterality: N/A;   MASTECTOMY  1995   right      Allergies  Allergen Reactions   Levofloxacin Hives and Itching   Nitrofurantoin Hives and Itching   Penicillins Hives and Itching   Sulfonamide Derivatives Itching   Pantoprazole Other (See Comments)    Bad dreams      Family History  Problem Relation Age of Onset   Diabetes Mother        diabetic coma   Heart failure Mother    Diabetes Father    Emphysema Father    Diabetes Sister    Hypercholesterolemia Sister    Hypercholesterolemia Brother    Diabetes Brother    Hypertension Brother    Hypercholesterolemia Brother    Diabetes Sister    Hypertension Sister    Hypercholesterolemia Sister    Hypercholesterolemia Sister    Hypercholesterolemia Sister    Diabetes Brother    Hypertension Brother    Colon cancer Neg Hx      Social History Angela Simmons reports that she has never smoked. She has never been exposed to tobacco smoke. She has never used smokeless tobacco. Angela Simmons reports no history of alcohol use.   Review of Systems CONSTITUTIONAL: No weight loss, fever, chills, weakness or fatigue.  HEENT: Eyes: No visual loss, blurred vision, double vision or yellow sclerae.No hearing loss, sneezing, congestion, runny nose or sore throat.  SKIN: No rash or itching.  CARDIOVASCULAR: per hpi RESPIRATORY: No shortness of breath, cough or sputum.  GASTROINTESTINAL: No anorexia, nausea, vomiting or diarrhea. No abdominal pain or blood.  GENITOURINARY: No burning on urination, no polyuria NEUROLOGICAL: No headache, dizziness, syncope, paralysis, ataxia, numbness or tingling in the extremities. No change in bowel or  bladder control.  MUSCULOSKELETAL: No muscle, back pain, joint pain or stiffness.  LYMPHATICS: No enlarged nodes. No history of splenectomy.  PSYCHIATRIC: No history of depression or anxiety.  ENDOCRINOLOGIC: No reports of sweating, cold or heat intolerance. No polyuria or polydipsia.  Marland Kitchen   Physical Examination Today's Vitals   03/13/22 0958  BP: (!) 162/90  Pulse: (!) 56  SpO2: 97%  Weight: 182 lb 6.4 oz (82.7 kg)  Height: '5\' 9"'$  (1.753 m)   Body mass index is 26.94 kg/m.  Gen: resting comfortably, no acute distress  HEENT: no scleral icterus, pupils equal round and reactive, no palptable cervical adenopathy,  CV: RRR, no m/r/g, no jvd Resp: Clear to auscultation bilaterally GI: abdomen is soft, non-tender, non-distended, normal bowel sounds, no hepatosplenomegaly MSK: extremities are warm, no edema.  Skin: warm, no rash Neuro:  no focal deficits Psych: appropriate affect   Diagnostic Studies  05/2019 nuclear stress No diagnostic ST segment changes to indicate ischemia. No significant myocardial perfusion defects to indicate scar or ischemia. There is apical breast attenuation artifact This is a low risk study. Nuclear stress EF: 72%.   04/2019 echo IMPRESSIONS     1. Left ventricular ejection fraction, by estimation, is 60 to 65%. The  left ventricle has normal function. The left ventricle has no regional  wall motion abnormalities. There is mild left ventricular hypertrophy.  Left ventricular diastolic parameters  were normal.   2. Right ventricular systolic function is normal. The right ventricular  size is normal. There is mildly elevated pulmonary artery systolic  pressure.   3. The mitral valve is normal in structure. Trivial mitral valve  regurgitation. No evidence of mitral stenosis.   4. The aortic valve is tricuspid. Aortic valve regurgitation is not  visualized. No aortic stenosis is present.   5. The inferior vena cava is normal in size with greater than  50%  respiratory variability, suggesting right atrial pressure of 3 mmHg.   11/2019 cath IMPRESSION: Angela Simmons has normal left coronary system.  She has an apparent takeoff RCA from close to the left main takeoff.  I was unable to selectively intubate this from the right radial approach.  I did use 180 cc of contrast and I aborted the procedure.  She will need to return for RCA angiography via the right femoral approach.  The sheath was removed and a TR band was placed on the right wrist to achieve patent hemostasis.  The patient left lab in stable condition.  She will be discharged home today as an outpatient.   06/2020 event monitor Rare supraventricular ectopy. Rare short runs of SVT up to 12 beats. Rare ventricular ectopy Reported episode of "fainting" correlated with sinus rhythm at 60 bpm     Patch Wear Time:  13 days and 16 hours (2022-05-18T11:46:34-0400 to 2022-06-01T04:15:38-0400)   Patient had a min HR of 41 bpm, max HR of 174 bpm, and avg HR of 55 bpm. Predominant underlying rhythm was Sinus Rhythm. 27 Supraventricular Tachycardia runs occurred, the run with the fastest interval lasting 5 beats with a max rate of 174 bpm, the longest lasting 12 beats with an avg rate of 113 bpm. Isolated SVEs were rare (<1.0%), SVE Couplets were rare (<1.0%), and SVE Triplets were rare (<1.0%). Isolated VEs were rare (<1.0%), VE Couplets were rare (<1.0%), and no VE Triplets were present. Ventricular Bigeminy was present.   03/2021 echo IMPRESSIONS     1. Left ventricular ejection fraction, by estimation, is 55 to 60%. The  left ventricle has normal function. The left ventricle has no regional  wall motion abnormalities. There is mild asymmetric left ventricular  hypertrophy of the basal segment. Left  ventricular diastolic parameters are indeterminate.   2. Right ventricular systolic function is normal. The right ventricular  size is normal. There is mildly elevated pulmonary artery systolic   pressure. The estimated right ventricular systolic pressure is 123XX123 mmHg.   3. The mitral valve is grossly normal. Mild mitral valve regurgitation.   4. The aortic valve is tricuspid. Aortic valve regurgitation  is mild.  Aortic valve sclerosis is present, with no evidence of aortic valve  stenosis.   5. The inferior vena cava is normal in size with greater than 50%  respiratory variability, suggesting right atrial pressure of 3 mmHg.   Comparison(s): Prior images reviewed side by side. LVEF remains normal  range at 55-60%. Estimated RVSP mildly elevated.    Assessment and Plan   1. Aflutter/atach/acquired thrombophilia -recent ER visit with aflutter with RVR but had missed a few days of her medications - has done well since that time, continue current therapy including eliquis for stroke prevention   2. HTN - bp is at goal based on home numbers, often elevated in clinic consistnet with white coat HTN - continue current meds   3. Hyperipidemia - at goal, continue current meds  4. Preop evaluation - ok for coloscopy from cardiac standpoint, can hold eliquis 2 days prior and resume day after  F/u 6 months   Arnoldo Lenis, M.D.

## 2022-03-19 ENCOUNTER — Other Ambulatory Visit: Payer: Self-pay | Admitting: Physician Assistant

## 2022-03-19 DIAGNOSIS — E1142 Type 2 diabetes mellitus with diabetic polyneuropathy: Secondary | ICD-10-CM | POA: Diagnosis not present

## 2022-03-19 DIAGNOSIS — M79676 Pain in unspecified toe(s): Secondary | ICD-10-CM | POA: Diagnosis not present

## 2022-03-19 DIAGNOSIS — L84 Corns and callosities: Secondary | ICD-10-CM | POA: Diagnosis not present

## 2022-03-19 DIAGNOSIS — L603 Nail dystrophy: Secondary | ICD-10-CM | POA: Diagnosis not present

## 2022-03-19 NOTE — Telephone Encounter (Signed)
Next Visit: 07/10/2022  Last Visit: 01/21/2022  Labs: 02/16/2022 WBC 3.8, BUN 6, Calcium 8.6,   Eye exam: 01/09/2022 WNL    Current Dose per office note 01/21/2022:  Plaquenil 200 mg 1 tablet by mouth daily.    DX: Sjogren's syndrome with keratoconjunctivitis sicca   Last Fill: 12/23/2021  Okay to refill Plaquenil?

## 2022-03-26 ENCOUNTER — Other Ambulatory Visit: Payer: Self-pay | Admitting: Internal Medicine

## 2022-04-06 ENCOUNTER — Other Ambulatory Visit (HOSPITAL_COMMUNITY): Payer: Self-pay | Admitting: Internal Medicine

## 2022-04-06 DIAGNOSIS — Z1231 Encounter for screening mammogram for malignant neoplasm of breast: Secondary | ICD-10-CM

## 2022-04-07 ENCOUNTER — Encounter: Payer: Self-pay | Admitting: Internal Medicine

## 2022-04-07 NOTE — Progress Notes (Unsigned)
Subjective:    Patient ID: Angela Simmons, female    DOB: 08-21-1945, 77 y.o.   MRN: [id number]     HPI Angela Simmons is here for follow up of her chronic medical problems.  Has seen neurology 0 has neuropathy d/t sjogren's dz.    Medications and allergies reviewed with patient and updated if appropriate.  Current Outpatient Medications on File Prior to Visit  Medication Sig Dispense Refill   apixaban (ELIQUIS) 5 MG TABS tablet Take 1 tablet by mouth twice daily 60 tablet 5   atorvastatin (LIPITOR) 10 MG tablet Take 1 tablet by mouth daily 180 tablet 0   benazepril (LOTENSIN) 40 MG tablet Take 1 tablet (40 mg total) by mouth daily. 90 tablet 0   Blood Glucose Monitoring Suppl (ONETOUCH VERIO) w/Device KIT USE TO TEST BLOOD SUGAR ONCE DAILY AS DIRECTED 1 kit 0   clotrimazole-betamethasone (LOTRISONE) cream Apply topically.     diclofenac sodium (VOLTAREN) 1 % GEL Apply 3 g to 3 large joints up to 3 times daily. 3 Tube 3   DILT-XR 120 MG 24 hr capsule TAKE 1 CAPSULE BY MOUTH EVERY DAY 90 capsule 3   fluticasone (FLONASE) 50 MCG/ACT nasal spray Place 1 spray into both nostrils as needed for allergies or rhinitis.     glucose blood test strip USE TO TEST BLOOD SUGAR ONCE DAILY. E11.40 100 each 3   hydroxychloroquine (PLAQUENIL) 200 MG tablet Take 1 tablet by mouth once daily 90 tablet 0   loratadine (CLARITIN) 10 MG tablet TAKE 1 TABLET BY MOUTH EVERY DAY 30 tablet 2   metoprolol tartrate (LOPRESSOR) 25 MG tablet Take 1 tablet (25 mg total) by mouth 2 (two) times daily. (May take an extra 25mg  as needed for palpitation.) 03/13/2022 270 tablet 3   Multiple Vitamins-Minerals (CENTRUM SILVER 50+WOMEN) TABS Take 1 tablet by mouth daily at 6 (six) AM.     Omega-3 Fatty Acids (FISH OIL) 1000 MG CAPS Take 1,000 mg by mouth daily.     omeprazole (PRILOSEC) 40 MG capsule Take 1 capsule by mouth once daily 90 capsule 0   ONETOUCH DELICA LANCETS 99991111 MISC USE TO TEST ONCE DAILY 100 each 2   ONETOUCH  VERIO test strip USE TO TEST BLOOD SUGAR ONCE DAILY 100 each 0   No current facility-administered medications on file prior to visit.     Review of Systems     Objective:  There were no vitals filed for this visit. BP Readings from Last 3 Encounters:  03/13/22 (!) 150/90  03/05/22 (!) 179/80  02/20/22 (!) 168/93   Wt Readings from Last 3 Encounters:  03/13/22 182 lb 6.4 oz (82.7 kg)  03/05/22 186 lb 3.2 oz (84.5 kg)  02/20/22 186 lb (84.4 kg)   There is no height or weight on file to calculate BMI.    Physical Exam     Lab Results  Component Value Date   WBC 3.8 (L) 02/16/2022   HGB 12.4 02/16/2022   HCT 37.0 02/16/2022   PLT 180 02/16/2022   GLUCOSE 98 02/16/2022   CHOL 138 01/05/2022   TRIG 43.0 01/05/2022   HDL 51.00 01/05/2022   LDLCALC 78 01/05/2022   ALT 14 01/05/2022   AST 17 01/05/2022   NA 139 02/16/2022   K 4.0 02/16/2022   CL 105 02/16/2022   CREATININE 0.83 02/16/2022   BUN 6 (L) 02/16/2022   CO2 23 02/16/2022   TSH 1.710 02/16/2022   HGBA1C 6.2  01/05/2022   MICROALBUR 1.1 07/04/2021     Assessment & Plan:    See Problem List for Assessment and Plan of chronic medical problems.

## 2022-04-08 ENCOUNTER — Ambulatory Visit (INDEPENDENT_AMBULATORY_CARE_PROVIDER_SITE_OTHER): Payer: 59 | Admitting: Internal Medicine

## 2022-04-08 VITALS — BP 132/70 | HR 78 | Temp 98.2°F | Ht 69.0 in | Wt 186.0 lb

## 2022-04-08 DIAGNOSIS — K219 Gastro-esophageal reflux disease without esophagitis: Secondary | ICD-10-CM

## 2022-04-08 DIAGNOSIS — E1140 Type 2 diabetes mellitus with diabetic neuropathy, unspecified: Secondary | ICD-10-CM | POA: Diagnosis not present

## 2022-04-08 DIAGNOSIS — E7849 Other hyperlipidemia: Secondary | ICD-10-CM

## 2022-04-08 DIAGNOSIS — I700 Atherosclerosis of aorta: Secondary | ICD-10-CM

## 2022-04-08 DIAGNOSIS — R6889 Other general symptoms and signs: Secondary | ICD-10-CM | POA: Insufficient documentation

## 2022-04-08 DIAGNOSIS — N907 Vulvar cyst: Secondary | ICD-10-CM

## 2022-04-08 DIAGNOSIS — I480 Paroxysmal atrial fibrillation: Secondary | ICD-10-CM

## 2022-04-08 DIAGNOSIS — I10 Essential (primary) hypertension: Secondary | ICD-10-CM | POA: Diagnosis not present

## 2022-04-08 LAB — LIPID PANEL
Cholesterol: 140 mg/dL (ref 0–200)
HDL: 52.3 mg/dL (ref >39.00)
LDL Cholesterol: 78 mg/dL (ref 0–99)
NonHDL: 87.22
Total CHOL/HDL Ratio: 3
Triglycerides: 44 mg/dL (ref 0.0–149.0)
VLDL: 8.8 mg/dL (ref 0.0–40.0)

## 2022-04-08 LAB — CBC WITH DIFFERENTIAL/PLATELET
Basophils Absolute: 0 10*3/uL (ref 0.0–0.1)
Basophils Relative: 0.7 % (ref 0.0–3.0)
Eosinophils Absolute: 0 10*3/uL (ref 0.0–0.7)
Eosinophils Relative: 0.7 % (ref 0.0–5.0)
HCT: 38.8 % (ref 36.0–46.0)
Hemoglobin: 12.7 g/dL (ref 12.0–15.0)
Lymphocytes Relative: 28.2 % (ref 12.0–46.0)
Lymphs Abs: 0.9 10*3/uL (ref 0.7–4.0)
MCHC: 32.8 g/dL (ref 30.0–36.0)
MCV: 93.6 fl (ref 78.0–100.0)
Monocytes Absolute: 0.2 10*3/uL (ref 0.1–1.0)
Monocytes Relative: 7.1 % (ref 3.0–12.0)
Neutro Abs: 2.1 10*3/uL (ref 1.4–7.7)
Neutrophils Relative %: 63.3 % (ref 43.0–77.0)
Platelets: 196 10*3/uL (ref 150.0–400.0)
RBC: 4.14 Mil/uL (ref 3.87–5.11)
RDW: 14.7 % (ref 11.5–15.5)
WBC: 3.3 10*3/uL — ABNORMAL LOW (ref 4.0–10.5)

## 2022-04-08 LAB — COMPREHENSIVE METABOLIC PANEL
ALT: 13 U/L (ref 0–35)
AST: 16 U/L (ref 0–37)
Albumin: 3.9 g/dL (ref 3.5–5.2)
Alkaline Phosphatase: 67 U/L (ref 39–117)
BUN: 13 mg/dL (ref 6–23)
CO2: 30 mEq/L (ref 19–32)
Calcium: 9.5 mg/dL (ref 8.4–10.5)
Chloride: 104 mEq/L (ref 96–112)
Creatinine, Ser: 0.91 mg/dL (ref 0.40–1.20)
GFR: 61.56 mL/min (ref >60.00)
Glucose, Bld: 87 mg/dL (ref 70–99)
Potassium: 3.7 mEq/L (ref 3.5–5.1)
Sodium: 140 mEq/L (ref 135–145)
Total Bilirubin: 0.5 mg/dL (ref 0.2–1.2)
Total Protein: 7.7 g/dL (ref 6.0–8.3)

## 2022-04-08 LAB — HEMOGLOBIN A1C: Hgb A1c MFr Bld: 6.2 % (ref 4.6–6.5)

## 2022-04-08 LAB — TSH: TSH: 2.44 u[IU]/mL (ref 0.35–5.50)

## 2022-04-08 MED ORDER — OMEPRAZOLE 40 MG PO CPDR: 40.0000 mg | DELAYED_RELEASE_CAPSULE | Freq: Every day | ORAL | 1 refills | Status: DC

## 2022-04-08 MED ORDER — BENAZEPRIL HCL 40 MG PO TABS: 40.0000 mg | ORAL_TABLET | Freq: Every day | ORAL | 2 refills | Status: DC

## 2022-04-08 NOTE — Assessment & Plan Note (Signed)
Chronic   Lab Results  Component Value Date   HGBA1C 6.2 04/08/2022   Sugars well controlled Testing sugars 1 times a day Check A1c Continue lifestyle control Stressed regular exercise, diabetic diet

## 2022-04-08 NOTE — Assessment & Plan Note (Signed)
Chronic Continue atorvastatin 10 mg daily Encouraged regular exercise, healthy diet

## 2022-04-08 NOTE — Assessment & Plan Note (Signed)
Chronic Blood pressure well controlled CMP Continue benazepril 40 mg daily, diltiazem 120 mg daily, metoprolol 25 mg twice daily 

## 2022-04-08 NOTE — Assessment & Plan Note (Signed)
New States she has had vivid dreams for a while and they do not occur on a nightly basis They do not bother her that much She takes 3 medications at night-atorvastatin, Eliquis and metoprolol Discussed that all 3 of them could potentially cause vivid dreams Since they do not bother her that much will not make any changes-if they do start to bother her more may need to consider changing some of the medication

## 2022-04-08 NOTE — Assessment & Plan Note (Signed)
Chronic GERD controlled Continue omeprazole 40 mg daily 

## 2022-04-08 NOTE — Assessment & Plan Note (Signed)
Chronic Following with cardiology Paroxysmal atrial fibrillation On metoprolol 25 mg twice daily and Eliquis 5 mg twice daily CBC, TSH

## 2022-04-08 NOTE — Patient Instructions (Addendum)
      Blood work was ordered.   The lab is on the first floor.    Medications changes include :   none   A referral for a GYN was ordered.     Return in about 6 months (around 10/09/2022) for follow up.

## 2022-04-08 NOTE — Assessment & Plan Note (Signed)
Chronic Regular exercise and healthy diet encouraged Check lipid panel  Continue atorvastatin 10 mg daily 

## 2022-04-15 ENCOUNTER — Encounter: Payer: Self-pay | Admitting: *Deleted

## 2022-04-15 ENCOUNTER — Other Ambulatory Visit: Payer: Self-pay | Admitting: *Deleted

## 2022-04-15 ENCOUNTER — Telehealth: Payer: Self-pay | Admitting: *Deleted

## 2022-04-15 MED ORDER — PEG 3350-KCL-NA BICARB-NACL 420 G PO SOLR: 4000.0000 mL | Freq: Once | ORAL | 0 refills | Status: AC

## 2022-04-15 NOTE — Telephone Encounter (Signed)
[employer] PA: No PA needed. Thank you for your online prior authorization/notification submission.  The prior authorization/notification reference number is: [id number]

## 2022-04-16 ENCOUNTER — Other Ambulatory Visit: Payer: Self-pay | Admitting: Physician Assistant

## 2022-04-16 ENCOUNTER — Encounter (HOSPITAL_COMMUNITY): Payer: Self-pay

## 2022-04-16 ENCOUNTER — Ambulatory Visit (HOSPITAL_COMMUNITY)
Admission: RE | Admit: 2022-04-16 | Discharge: 2022-04-16 | Disposition: A | Discharge status: Home / Self Care | Payer: 59 | Source: Ambulatory Visit | Attending: Internal Medicine | Admitting: Internal Medicine

## 2022-04-16 ENCOUNTER — Other Ambulatory Visit (HOSPITAL_COMMUNITY): Payer: Self-pay | Admitting: Internal Medicine

## 2022-04-16 ENCOUNTER — Telehealth: Payer: Self-pay | Admitting: Cardiology

## 2022-04-16 DIAGNOSIS — Z1231 Encounter for screening mammogram for malignant neoplasm of breast: Secondary | ICD-10-CM | POA: Diagnosis not present

## 2022-04-16 NOTE — Telephone Encounter (Signed)
Patient is calling to follow up. Please advise.  

## 2022-04-16 NOTE — Telephone Encounter (Signed)
Patient came by the office stating that she was not able to pick up diltiazem from [facility] in [place]. After contacting pharmacy, spoke with Caryl Pina who states that prescription was ready for patient to pick up.

## 2022-04-16 NOTE — Telephone Encounter (Signed)
Pt c/o medication issue:  1. Name of Medication: DILT-XR 120 MG 24 hr capsule   2. How are you currently taking this medication (dosage and times per day)? TAKE 1 CAPSULE BY MOUTH EVERY DAY   3. Are you having a reaction (difficulty breathing--STAT)? No  4. What is your medication issue? Pt tried to pick up the medication from the pharmacy and they stated that the pharmacy stated it wasn't time for their refill. Patient stated they are currently out of medication and they are only taking 1 capsule by mouth daily. Patient will be at a doctors appt today and requested that you call back after 2PM today.

## 2022-05-04 ENCOUNTER — Other Ambulatory Visit: Payer: Self-pay | Admitting: Internal Medicine

## 2022-05-11 ENCOUNTER — Encounter (HOSPITAL_COMMUNITY)
Admission: RE | Admit: 2022-05-11 | Discharge: 2022-05-11 | Disposition: A | Discharge status: Home / Self Care | Payer: 59 | Source: Ambulatory Visit | Attending: Internal Medicine | Admitting: Internal Medicine

## 2022-05-11 ENCOUNTER — Other Ambulatory Visit: Payer: Self-pay

## 2022-05-11 ENCOUNTER — Encounter (HOSPITAL_COMMUNITY): Payer: Self-pay

## 2022-05-11 NOTE — Progress Notes (Signed)
PAT phone call completed with pt. Pt verbalized understanding of prep instructions and arrival time.

## 2022-05-13 ENCOUNTER — Ambulatory Visit (HOSPITAL_COMMUNITY): Payer: 59 | Admitting: Anesthesiology

## 2022-05-13 ENCOUNTER — Encounter (HOSPITAL_COMMUNITY)
Admission: RE | Disposition: A | Discharge status: Home / Self Care | Payer: Self-pay | Source: Home / Self Care | Attending: Internal Medicine

## 2022-05-13 ENCOUNTER — Ambulatory Visit (HOSPITAL_BASED_OUTPATIENT_CLINIC_OR_DEPARTMENT_OTHER): Payer: 59 | Admitting: Anesthesiology

## 2022-05-13 ENCOUNTER — Ambulatory Visit (HOSPITAL_COMMUNITY)
Admission: RE | Admit: 2022-05-13 | Discharge: 2022-05-13 | Disposition: A | Discharge status: Home / Self Care | Payer: 59 | Attending: Internal Medicine | Admitting: Internal Medicine

## 2022-05-13 ENCOUNTER — Encounter (HOSPITAL_COMMUNITY): Payer: Self-pay | Admitting: Internal Medicine

## 2022-05-13 DIAGNOSIS — D120 Benign neoplasm of cecum: Secondary | ICD-10-CM

## 2022-05-13 DIAGNOSIS — Z7901 Long term (current) use of anticoagulants: Secondary | ICD-10-CM | POA: Insufficient documentation

## 2022-05-13 DIAGNOSIS — Z7984 Long term (current) use of oral hypoglycemic drugs: Secondary | ICD-10-CM | POA: Insufficient documentation

## 2022-05-13 DIAGNOSIS — I4891 Unspecified atrial fibrillation: Secondary | ICD-10-CM | POA: Diagnosis not present

## 2022-05-13 DIAGNOSIS — K219 Gastro-esophageal reflux disease without esophagitis: Secondary | ICD-10-CM | POA: Diagnosis not present

## 2022-05-13 DIAGNOSIS — M1990 Unspecified osteoarthritis, unspecified site: Secondary | ICD-10-CM | POA: Diagnosis not present

## 2022-05-13 DIAGNOSIS — K641 Second degree hemorrhoids: Secondary | ICD-10-CM

## 2022-05-13 DIAGNOSIS — Z1211 Encounter for screening for malignant neoplasm of colon: Secondary | ICD-10-CM | POA: Insufficient documentation

## 2022-05-13 DIAGNOSIS — K635 Polyp of colon: Secondary | ICD-10-CM | POA: Diagnosis not present

## 2022-05-13 DIAGNOSIS — Z139 Encounter for screening, unspecified: Secondary | ICD-10-CM | POA: Diagnosis not present

## 2022-05-13 DIAGNOSIS — E119 Type 2 diabetes mellitus without complications: Secondary | ICD-10-CM | POA: Diagnosis not present

## 2022-05-13 DIAGNOSIS — M3500 Sicca syndrome, unspecified: Secondary | ICD-10-CM | POA: Diagnosis not present

## 2022-05-13 DIAGNOSIS — I10 Essential (primary) hypertension: Secondary | ICD-10-CM | POA: Diagnosis not present

## 2022-05-13 DIAGNOSIS — K648 Other hemorrhoids: Secondary | ICD-10-CM | POA: Diagnosis not present

## 2022-05-13 HISTORY — PX: POLYPECTOMY: SHX5525

## 2022-05-13 HISTORY — PX: COLONOSCOPY WITH PROPOFOL: SHX5780

## 2022-05-13 LAB — GLUCOSE, CAPILLARY
Glucose-Capillary: 74 mg/dL (ref 70–99)
Glucose-Capillary: 106 mg/dL — ABNORMAL HIGH (ref 70–99)

## 2022-05-13 SURGERY — COLONOSCOPY WITH PROPOFOL
Anesthesia: General

## 2022-05-13 MED ORDER — LACTATED RINGERS IV SOLN: INTRAVENOUS | Status: DC

## 2022-05-13 MED ORDER — PROPOFOL 500 MG/50ML IV EMUL
INTRAVENOUS | Status: DC | PRN
  Administered 2022-05-13: 150 ug/kg/min via INTRAVENOUS

## 2022-05-13 MED ORDER — DEXTROSE 50 % IV SOLN
INTRAVENOUS | Status: AC
  Filled 2022-05-13: qty 50

## 2022-05-13 MED ORDER — PROPOFOL 500 MG/50ML IV EMUL
INTRAVENOUS | Status: AC
  Filled 2022-05-13: qty 50

## 2022-05-13 MED ORDER — PROPOFOL 10 MG/ML IV BOLUS
INTRAVENOUS | Status: DC | PRN
  Administered 2022-05-13: 60 mg via INTRAVENOUS

## 2022-05-13 MED ORDER — DEXTROSE 50 % IV SOLN
25.0000 mL | Freq: Once | INTRAVENOUS | Status: AC
  Administered 2022-05-13: 25 mL via INTRAVENOUS

## 2022-05-13 NOTE — H&P (Signed)
 @   Primary Care Physician:  Pincus Sanes, MD Primary Gastroenterologist:  Dr. Jena Gauss  Pre-Procedure History & Physical: HPI:  Angela Simmons is a 77 y.o. female here for   Screening colonoscopy negative colonoscopy 2013.  No lower GI tract symptoms at this time.  Eliquis held 2 days ago.  Past Medical History:  Diagnosis Date   Breast cancer 1990s   right breast   Collagen vascular disease    Diabetes mellitus without complication    GERD (gastroesophageal reflux disease)    HTN (hypertension)    Hypercholesterolemia    Obesity    Osteoarthritis     Past Surgical History:  Procedure Laterality Date   ABDOMINAL HYSTERECTOMY  [place]   AORTIC ARCH ANGIOGRAPHY N/A 12/07/2019   Procedure: AORTIC ARCH ANGIOGRAPHY;  Surgeon: Runell Gess, MD;  Location: [facility];  Service: Cardiovascular;  Laterality: N/A;   COLONOSCOPY  May 2003   Dr. Katrinka Blazing: normal   COLONOSCOPY  06/22/2011   Procedure: COLONOSCOPY;  Surgeon: West Bali, MD;  Location: [facility];  Service: Endoscopy;  Laterality: N/A;  10:30   LEFT HEART CATH AND CORONARY ANGIOGRAPHY N/A 12/07/2019   Procedure: LEFT HEART CATH AND CORONARY ANGIOGRAPHY;  Surgeon: Runell Gess, MD;  Location: [facility];  Service: Cardiovascular;  Laterality: N/A;   MASTECTOMY  1995   right     Prior to Admission medications   Medication Sig Start Date End Date Taking? Authorizing Provider  ACCU-CHEK GUIDE test strip USE 1 STRIP TO CHECK GLUCOSE ONCE DAILY 05/04/22  Yes Burns, Bobette Mo, MD  apixaban (ELIQUIS) 5 MG TABS tablet Take 1 tablet by mouth twice daily 12/29/21  Yes Branch, Dorothe Pea, MD  atorvastatin (LIPITOR) 10 MG tablet Take 1 tablet by mouth daily 12/22/21  Yes Burns, Bobette Mo, MD  benazepril (LOTENSIN) 40 MG tablet Take 1 tablet (40 mg total) by mouth daily. 04/08/22  Yes Burns, Bobette Mo, MD  Blood Glucose Monitoring Suppl (ONETOUCH VERIO) w/Device KIT USE TO TEST BLOOD SUGAR ONCE  DAILY AS DIRECTED 04/05/17  Yes Burns, Bobette Mo, MD  diclofenac sodium (VOLTAREN) 1 % GEL Apply 3 g to 3 large joints up to 3 times daily. Patient taking differently: Apply 3 g topically 3 (three) times daily as needed (pain). 07/20/17  Yes Gearldine Bienenstock, PA-C  diltiazem (DILT-XR) 120 MG 24 hr capsule Take 1 capsule (120 mg total) by mouth daily. 04/16/22  Yes BranchDorothe Pea, MD  fluticasone (FLONASE) 50 MCG/ACT nasal spray Place 1 spray into both nostrils as needed for allergies or rhinitis.   Yes [provider]  hydroxychloroquine (PLAQUENIL) 200 MG tablet Take 1 tablet by mouth once daily 03/19/22  Yes Deveshwar, Janalyn Rouse, MD  loratadine (CLARITIN) 10 MG tablet TAKE 1 TABLET BY MOUTH EVERY DAY 03/25/15  Yes Kerri Perches, MD  metoprolol tartrate (LOPRESSOR) 25 MG tablet Take 1 tablet (25 mg total) by mouth 2 (two) times daily. (May take an extra  as needed for palpitation.) 03/13/2022 03/13/22  Yes Branch, Dorothe Pea, MD  Multiple Vitamins-Minerals (CENTRUM SILVER 50+WOMEN) TABS Take 1 tablet by mouth daily at 6 (six) AM. 09/07/19  Yes Burns, Bobette Mo, MD  Omega-3 Fatty Acids (FISH OIL) 1000 MG CAPS Take 1,000 mg by mouth daily.   Yes [provider]  omeprazole (PRILOSEC) 40 MG capsule Take 1 capsule (40 mg total) by mouth daily. 04/08/22  Yes Burns, Bobette Mo,  MD  [facility] DELICA LANCETS 30G MISC USE TO TEST ONCE DAILY 03/03/18  Yes Pincus Sanes, MD  [facility] VERIO test strip USE TO TEST BLOOD SUGAR ONCE DAILY 07/06/18  Yes Pincus Sanes, MD    Allergies as of 04/15/2022 - Review Complete 04/07/2022  Allergen Reaction Noted   Levofloxacin Hives and Itching 02/08/2007   Nitrofurantoin Hives and Itching 02/08/2007   Penicillins Hives and Itching 02/08/2007   Sulfonamide derivatives Itching 02/08/2007   Pantoprazole Other (See Comments) 11/05/2017    Family History  Problem Relation Age of Onset   Diabetes Mother        diabetic coma   Heart failure Mother    Diabetes  Father    Emphysema Father    Diabetes Sister    Hypercholesterolemia Sister    Hypercholesterolemia Brother    Diabetes Brother    Hypertension Brother    Hypercholesterolemia Brother    Diabetes Sister    Hypertension Sister    Hypercholesterolemia Sister    Hypercholesterolemia Sister    Hypercholesterolemia Sister    Diabetes Brother    Hypertension Brother    Colon cancer Neg Hx     Social History   Socioeconomic History   Marital status: Widowed    Spouse name: Not on file   Number of children: 3   Years of education: 7   Highest education level: Not on file  Occupational History   Not on file  Tobacco Use   Smoking status: Never    Passive exposure: Never   Smokeless tobacco: Never  Vaping Use   Vaping Use: Never used  Substance and Sexual Activity   Alcohol use: No   Drug use: Never   Sexual activity: Never    Birth control/protection: Rhythm  Other Topics Concern   Not on file  Social History Narrative   Lives at home with husband   Caffeine, maybe 1 a day   Social Determinants of Health   Financial Resource Strain: Low Risk  (08/31/2019)   Overall Financial Resource Strain (CARDIA)    Difficulty of Paying Living Expenses: Not very hard  Food Insecurity: Not on file  Transportation Needs: Not on file  Physical Activity: Not on file  Stress: Not on file  Social Connections: Not on file  Intimate Partner Violence: Not on file    Review of Systems: See HPI, otherwise negative ROS  Physical Exam: BP (!) 168/78   Pulse 60   Temp 97.7 F (36.5 C) (Oral)   Resp (!) 22   Ht  (1.753 m)   Wt 83 kg   SpO2 100%   BMI 27.02 kg/m  General:   Alert,  Well-developed, well-nourished, pleasant and cooperative in NAD Neck:  Supple; no masses or thyromegaly. No significant cervical adenopathy. Lungs:  Clear throughout to auscultation.   No wheezes, crackles, or rhonchi. No acute distress. Heart:  Regular rate and rhythm; no murmurs, clicks, rubs,   or gallops. Abdomen: Non-distended, normal bowel sounds.  Soft and nontender without appreciable mass or hepatosplenomegaly.  Pulses:  Normal pulses noted. Extremities:  Without clubbing or edema.  Impression/Plan:    77 year old lady here for average risk screening colonoscopy. The risks, benefits, limitations, alternatives and imponderables have been reviewed with the patient. Questions have been answered. All parties are agreeable.       Notice: This dictation was prepared with Dragon dictation along with smaller phrase technology. Any transcriptional errors that result from this process are unintentional and may  not be corrected upon review.

## 2022-05-13 NOTE — Anesthesia Postprocedure Evaluation (Signed)
Anesthesia Post Note  Patient: Angela Simmons  Procedure(s) Performed: COLONOSCOPY WITH PROPOFOL POLYPECTOMY  Patient location during evaluation: PACU Anesthesia Type: General Level of consciousness: awake and alert and oriented Pain management: pain level controlled Vital Signs Assessment: post-procedure vital signs reviewed and stable Respiratory status: spontaneous breathing, nonlabored ventilation and respiratory function stable Cardiovascular status: blood pressure returned to baseline and stable Postop Assessment: no apparent nausea or vomiting Anesthetic complications: no  No notable events documented.   Last Vitals:  Vitals:   05/13/22 1135 05/13/22 1339  BP: (!) 168/78 131/76  Pulse: 60 65  Resp: (!) 22 (!) 21  Temp: 36.5 C 36.6 C  SpO2: 100% 100%    Last Pain:  Vitals:   05/13/22 1339  TempSrc: Axillary  PainSc: 0-No pain                 Merion Caton C Darik Massing

## 2022-05-13 NOTE — Op Note (Signed)
[facility] Patient Name: Angela Simmons Procedure Date: 05/13/2022 12:20 PM MRN: [id number] Date of Birth: [birth year] Attending MD: Gennette Pac , MD, [id number] CSN: [id number] Age: 77 Admit Type: Outpatient Procedure:                Colonoscopy Indications:              Screening for colorectal malignant neoplasm Providers:                Gennette Pac, MD, Sheran Fava,                            Pandora Leiter, Technician Referring MD:              Medicines:                Propofol per Anesthesia Complications:            No immediate complications. Estimated Blood Loss:     Estimated blood loss was minimal. Procedure:                Pre-Anesthesia Assessment:                           - Prior to the procedure, a History and Physical                            was performed, and patient medications and                            allergies were reviewed. The patient's tolerance of                            previous anesthesia was also reviewed. The risks                            and benefits of the procedure and the sedation                            options and risks were discussed with the patient.                            All questions were answered, and informed consent                            was obtained. Prior Anticoagulants: The patient has                            taken no anticoagulant or antiplatelet agents. ASA                            Grade Assessment: III - A patient with severe                            systemic disease. After reviewing the risks and  benefits, the patient was deemed in satisfactory                            condition to undergo the procedure.                           After obtaining informed consent, the colonoscope                            was passed under direct vision. Throughout the                            procedure, the patient's blood pressure, pulse, and                             oxygen saturations were monitored continuously. The                            [id number]) scope was introduced through                            the anus and advanced to the the cecum, identified                            by appendiceal orifice and ileocecal valve. The                            colonoscopy was performed without difficulty. The                            patient tolerated the procedure well. The quality                            of the bowel preparation was adequate. The                            ileocecal valve, appendiceal orifice, and rectum                            were photographed. The entire colon was well                            visualized. Scope In: 1:22:18 PM Scope Out: 1:34:35 PM Scope Withdrawal Time: 0 hours 8 minutes 42 seconds  Total Procedure Duration: 0 hours 12 minutes 17 seconds  Findings:      The perianal and digital rectal examinations were normal.      Internal hemorrhoids were found during retroflexion. The hemorrhoids       were moderate, medium-sized and Grade II (internal hemorrhoids that       prolapse but reduce spontaneously).      Four sessile polyps were found in the cecum. The polyps were 3 to 5 mm       in size. These polyps were removed with a cold snare. Resection and       retrieval were complete. Estimated blood  loss was minimal. Estimated       blood loss was minimal.      The exam was otherwise without abnormality on direct and retroflexion       views. Impression:               - Internal hemorrhoids.                           - Four 3 to 5 mm polyps in the cecum, removed with                            a cold snare. Resected and retrieved.                           - The examination was otherwise normal on direct                            and retroflexion views. Moderate Sedation:      Moderate (conscious) sedation was personally administered by an       anesthesia professional. The following  parameters were monitored: oxygen       saturation, heart rate, blood pressure, respiratory rate, EKG, adequacy       of pulmonary ventilation, and response to care. Recommendation:           - Patient has a contact number available for                            emergencies. The signs and symptoms of potential                            delayed complications were discussed with the                            patient. Return to normal activities tomorrow.                            Written discharge instructions were provided to the                            patient.                           - Advance diet as tolerated.                           - Continue present medications.                           - Repeat colonoscopy date to be determined after                            pending pathology results are reviewed for                            surveillance.                           -  Return to GI office (date not yet determined).                            Resume Eliquis tomorrow. Procedure Code(s):        --- Professional ---                           [phone], Colonoscopy, flexible; with removal of                            tumor(s), polyp(s), or other lesion(s) by snare                            technique Diagnosis Code(s):        --- Professional ---                           Z12.11, Encounter for screening for malignant                            neoplasm of colon                           D12.0, Benign neoplasm of cecum                           K64.1, Second degree hemorrhoids CPT copyright 2022 [employer]. All rights reserved. The codes documented in this report are preliminary and upon coder review may  be revised to meet current compliance requirements. Gerrit Friends. Cathryn Gallery, MD Gennette Pac, MD 05/13/2022 1:42:39 PM This report has been signed electronically. Number of Addenda: 0

## 2022-05-13 NOTE — Transfer of Care (Signed)
Immediate Anesthesia Transfer of Care Note  Patient: Angela Simmons  Procedure(s) Performed: COLONOSCOPY WITH PROPOFOL POLYPECTOMY  Patient Location: Short Stay  Anesthesia Type:General  Level of Consciousness: awake, alert , and oriented  Airway & Oxygen Therapy: Patient Spontanous Breathing  Post-op Assessment: Report given to RN, Post -op Vital signs reviewed and stable, Patient moving all extremities X 4, and Patient able to stick tongue midline  Post vital signs: Reviewed  Last Vitals:  Vitals Value Taken Time  BP 131/76   Temp 98.1   Pulse 64   Resp 26   SpO2 100     Last Pain:  Vitals:   05/13/22 1317  TempSrc:   PainSc: 0-No pain         Complications: No notable events documented.

## 2022-05-13 NOTE — Discharge Instructions (Signed)
  Colonoscopy Discharge Instructions  Read the instructions outlined below and refer to this sheet in the next few weeks. These discharge instructions provide you with general information on caring for yourself after you leave the hospital. Your doctor may also give you specific instructions. While your treatment has been planned according to the most current medical practices available, unavoidable complications occasionally occur. If you have any problems or questions after discharge, call Dr. Jena Gauss at [phone]. ACTIVITY You may resume your regular activity, but move at a slower pace for the next 24 hours.  Take frequent rest periods for the next 24 hours.  Walking will help get rid of the air and reduce the bloated feeling in your belly (abdomen).  No driving for 24 hours (because of the medicine (anesthesia) used during the test).   Do not sign any important legal documents or operate any machinery for 24 hours (because of the anesthesia used during the test).  NUTRITION Drink plenty of fluids.  You may resume your normal diet as instructed by your doctor.  Begin with a light meal and progress to your normal diet. Heavy or fried foods are harder to digest and may make you feel sick to your stomach (nauseated).  Avoid alcoholic beverages for 24 hours or as instructed.  MEDICATIONS You may resume your normal medications unless your doctor tells you otherwise.  WHAT YOU CAN EXPECT TODAY Some feelings of bloating in the abdomen.  Passage of more gas than usual.  Spotting of blood in your stool or on the toilet paper.  IF YOU HAD POLYPS REMOVED DURING THE COLONOSCOPY: No aspirin products for 7 days or as instructed.  No alcohol for 7 days or as instructed.  Eat a soft diet for the next 24 hours.  FINDING OUT THE RESULTS OF YOUR TEST Not all test results are available during your visit. If your test results are not back during the visit, make an appointment with your caregiver to find out the  results. Do not assume everything is normal if you have not heard from your caregiver or the medical facility. It is important for you to follow up on all of your test results.  SEEK IMMEDIATE MEDICAL ATTENTION IF: You have more than a spotting of blood in your stool.  Your belly is swollen (abdominal distention).  You are nauseated or vomiting.  You have a temperature over 101.  You have abdominal pain or discomfort that is severe or gets worse throughout the day.     4 small polyps removed  from your colon today  Further recommendations to follow pending review of pathology report  at patient request, I called [facility] [place] at [phone] findings and recommendations   resume Eliquis tomorrow

## 2022-05-13 NOTE — Anesthesia Procedure Notes (Signed)
Procedure Name: General with mask airway Date/Time: 05/13/2022 1:16 PM  Performed by: Cy Blamer, CRNAPre-anesthesia Checklist: Patient identified, Emergency Drugs available, Suction available, Timeout performed and Patient being monitored Patient Re-evaluated:Patient Re-evaluated prior to induction Oxygen Delivery Method: Nasal cannula Preoxygenation: Pre-oxygenation with 100% oxygen Induction Type: IV induction Placement Confirmation: positive ETCO2 Dental Injury: Teeth and Oropharynx as per pre-operative assessment

## 2022-05-13 NOTE — Anesthesia Preprocedure Evaluation (Signed)
Anesthesia Evaluation  Patient identified by MRN, date of birth, ID band Patient awake    Reviewed: Allergy & Precautions, H&P , NPO status , Patient's Chart, lab work & pertinent test results  Airway Mallampati: I  TM Distance: >3 FB Neck ROM: Full   Comment: Cervical radiculopathy and degenerative disc disease Dental  (+) Dental Advisory Given, Missing   Pulmonary shortness of breath and with exertion   Pulmonary exam normal breath sounds clear to auscultation       Cardiovascular hypertension, Pt. on medications + DOE  + dysrhythmias Atrial Fibrillation  Rhythm:Regular Rate:Normal     Neuro/Psych  Headaches  Neuromuscular disease (Peripheral neuropathy due to sjogren's disease)  negative psych ROS   GI/Hepatic Neg liver ROS,GERD  Medicated,,  Endo/Other  diabetes, Well Controlled, Type 2, Oral Hypoglycemic Agents    Renal/GU negative Renal ROS  negative genitourinary   Musculoskeletal  (+) Arthritis , Osteoarthritis,    Abdominal   Peds negative pediatric ROS (+)  Hematology negative hematology ROS (+)   Anesthesia Other Findings Inflammatory arthritis Ischial bursitis of left side Joint pain Low back pain with radiation Mid back pain on right side Right breast cancer   Reproductive/Obstetrics negative OB ROS                             Anesthesia Physical Anesthesia Plan  ASA: 3  Anesthesia Plan: General   Post-op Pain Management: Minimal or no pain anticipated   Induction: Intravenous  PONV Risk Score and Plan: 1 and Propofol infusion  Airway Management Planned: Nasal Cannula and Natural Airway  Additional Equipment:   Intra-op Plan:   Post-operative Plan:   Informed Consent: I have reviewed the patients History and Physical, chart, labs and discussed the procedure including the risks, benefits and alternatives for the proposed anesthesia with the patient or  authorized representative who has indicated his/her understanding and acceptance.     Dental advisory given  Plan Discussed with: CRNA and Surgeon  Anesthesia Plan Comments:        Anesthesia Quick Evaluation

## 2022-05-15 ENCOUNTER — Encounter: Payer: Self-pay | Admitting: Internal Medicine

## 2022-05-15 LAB — SURGICAL PATHOLOGY

## 2022-05-18 ENCOUNTER — Encounter (HOSPITAL_COMMUNITY): Payer: Self-pay | Admitting: Internal Medicine

## 2022-05-19 ENCOUNTER — Telehealth: Payer: Self-pay

## 2022-05-19 DIAGNOSIS — H40021 Open angle with borderline findings, high risk, right eye: Secondary | ICD-10-CM | POA: Diagnosis not present

## 2022-05-19 NOTE — Telephone Encounter (Signed)
Called patient to schedule Medicare Annual Wellness Visit (AWV). Unable to reach patient.  Last date of AWV: 02/25/17  Please schedule an appointment at any time On Annual Wellness Visit Schedule.

## 2022-05-20 DIAGNOSIS — H905 Unspecified sensorineural hearing loss: Secondary | ICD-10-CM | POA: Diagnosis not present

## 2022-05-21 ENCOUNTER — Telehealth: Payer: Self-pay | Admitting: Rheumatology

## 2022-05-21 NOTE — Telephone Encounter (Signed)
Angela Simmons called requesting to speak with Dr. Corliss Skains. She stated she is waking up out of her sleep with pain in her left buttocks and needs relief. She asked to please return her call at [phone].

## 2022-05-21 NOTE — Telephone Encounter (Signed)
Attempted to contact the patient and left a message to call the office back. 

## 2022-05-21 NOTE — Telephone Encounter (Signed)
Attempted to contact the patient and left message for patient to call the office.  

## 2022-05-22 NOTE — Telephone Encounter (Signed)
Patient states several times this week she woke up with pain in the left side of her buttocks and does travel over to her side. Patient states it only hurts at night. Patient states she has tried sleeping on her back to help with that discomfort. Patient has tried Voltaren gel and heating pad which has helped. Patient states this states the pain started within the last 2 weeks. Patient would like to know what may be able to be done. Please advise.

## 2022-05-22 NOTE — Telephone Encounter (Signed)
Patient needs evaluation.  She can be seen by her PCP or orthopedic surgeon or in our office.

## 2022-05-22 NOTE — Telephone Encounter (Signed)
Patient advised she needs an evaluation.  Patient advised she can be seen by her PCP or orthopedic surgeon or in our office. Patient will call her PCP and orthopedist to see if she can be seen today. If she can not she will call our office and we can schedule her for Tuesday.

## 2022-05-25 ENCOUNTER — Telehealth: Payer: Self-pay

## 2022-05-25 NOTE — Progress Notes (Unsigned)
Office Visit Note  Patient: Angela Simmons             Date of Birth: 1945-06-13           MRN: [id number]             PCP: Pincus Sanes, MD Referring: Pincus Sanes, MD Visit Date: 05/27/2022 Occupation: @GUAROCC @  Subjective:  No chief complaint on file.   History of Present Illness: Angela Simmons is a 77 y.o. female ***     Activities of Daily Living:  Patient reports morning stiffness for *** {minute/hour:19697}.   Patient {ACTIONS;DENIES/REPORTS:21021675::"Denies"} nocturnal pain.  Difficulty dressing/grooming: {ACTIONS;DENIES/REPORTS:21021675::"Denies"} Difficulty climbing stairs: {ACTIONS;DENIES/REPORTS:21021675::"Denies"} Difficulty getting out of chair: {ACTIONS;DENIES/REPORTS:21021675::"Denies"} Difficulty using hands for taps, buttons, cutlery, and/or writing: {ACTIONS;DENIES/REPORTS:21021675::"Denies"}  No Rheumatology ROS completed.   PMFS History:  Patient Active Problem List   Diagnosis Date Noted   Vivid dream 04/08/2022   Peripheral neuropathy due to sjogren's disease 02/21/2022   Sensation of heaviness 01/05/2022   Mid back pain on right side 11/13/2021   Cervical radiculopathy due to degenerative joint disease of spine 02/13/2021   Right-sided chest pain 02/13/2021   Bilateral primary osteoarthritis of knee 02/13/2021   Right knee pain 01/03/2021   Radiculopathy 01/03/2021   Atrial flutter (HCC) 08/22/2020   Aortic atherosclerosis (HCC) 06/28/2020   Secondary hypercoagulable state (HCC) 12/26/2019   Anticoagulated 12/05/2019   PAF (paroxysmal atrial fibrillation) (HCC) 08/04/2019   DOE (dyspnea on exertion) 04/03/2019   Ankle swelling, left 04/03/2019   Hyperpigmented skin lesion 04/03/2019   Poor balance 12/06/2018   Headache 12/06/2018   Vertigo 06/07/2018   Herpes zoster without complication 03/23/2018   Tingling in extremities 11/26/2017   Ischial bursitis of left side 06/02/2017   Sicca syndrome (HCC) 06/23/2016   Bilateral leg  edema 04/07/2016   Burning sensation of skin 04/07/2016   PMR (polymyalgia rheumatica) (HCC) 03/27/2016   Osteopenia 03/04/2016   Dysuria 02/26/2016   Autoimmune disease (HCC) 03/18/2015   Inflammatory arthritis 03/18/2015   Joint pain 11/22/2014   Overweight (BMI 25.0-29.9) 08/18/2014   Low back pain with radiation 08/18/2014   Hyperlipidemia 06/28/2014   Chest pain with moderate risk of acute coronary syndrome 04/11/2014   Tendonitis of shoulder 08/29/2013   S/P right mastectomy 11/14/2012   Type 2 diabetes, controlled, with neuropathy (HCC) 10/05/2012   Allergic rhinitis 01/26/2011   GERD 11/10/2009   INTERSTITIAL CYSTITIS 10/31/2008   BENIGN NEOPLASM OF VULVA 02/01/2008   Essential hypertension, benign 02/08/2007   History of cancer of right breast 02/08/2007    Past Medical History:  Diagnosis Date   Breast cancer (HCC) 1990s   right breast   Collagen vascular disease (HCC)    Diabetes mellitus without complication (HCC)    GERD (gastroesophageal reflux disease)    HTN (hypertension)    Hypercholesterolemia    Obesity    Osteoarthritis     Family History  Problem Relation Age of Onset   Diabetes Mother        diabetic coma   Heart failure Mother    Diabetes Father    Emphysema Father    Diabetes Sister    Hypercholesterolemia Sister    Hypercholesterolemia Brother    Diabetes Brother    Hypertension Brother    Hypercholesterolemia Brother    Diabetes Sister    Hypertension Sister    Hypercholesterolemia Sister    Hypercholesterolemia Sister    Hypercholesterolemia Sister    Diabetes Brother  Hypertension Brother    Colon cancer Neg Hx    Past Surgical History:  Procedure Laterality Date   ABDOMINAL HYSTERECTOMY  [place]   AORTIC ARCH ANGIOGRAPHY N/A 12/07/2019   Procedure: AORTIC ARCH ANGIOGRAPHY;  Surgeon: Runell Gess, MD;  Location: [facility];  Service: Cardiovascular;  Laterality: N/A;   COLONOSCOPY  May 2003   Dr.  Katrinka Blazing: normal   COLONOSCOPY  06/22/2011   Procedure: COLONOSCOPY;  Surgeon: West Bali, MD;  Location: [facility];  Service: Endoscopy;  Laterality: N/A;  10:30   COLONOSCOPY WITH PROPOFOL N/A 05/13/2022   Procedure: COLONOSCOPY WITH PROPOFOL;  Surgeon: Corbin Ade, MD;  Location: [facility];  Service: Endoscopy;  Laterality: N/A;  1:15 PM   LEFT HEART CATH AND CORONARY ANGIOGRAPHY N/A 12/07/2019   Procedure: LEFT HEART CATH AND CORONARY ANGIOGRAPHY;  Surgeon: Runell Gess, MD;  Location: [facility];  Service: Cardiovascular;  Laterality: N/A;   MASTECTOMY  1995   right    POLYPECTOMY  05/13/2022   Procedure: POLYPECTOMY;  Surgeon: Corbin Ade, MD;  Location: [facility];  Service: Endoscopy;;   Social History   Social History Narrative   Lives at home with husband   Caffeine, maybe 1 a day   Immunization History  Administered Date(s) Administered   Fluad Quad(high Dose 65+) 12/06/2018, 12/27/2019, 01/03/2021   Influenza Split 11/14/2010   Influenza Whole 10/18/2006, 10/31/2008, 11/06/2009   Influenza, High Dose Seasonal PF 11/22/2014, 02/21/2016, 12/18/2016, 11/05/2017   Influenza,inj,Quad PF,6+ Mos 11/14/2012, 11/23/2013   Moderna Sars-Covid-2 Vaccination 03/16/2019, 04/13/2019   Pneumococcal Conjugate-13 04/11/2014   Pneumococcal Polysaccharide-23 09/17/2011   Td 07/05/2003   Zoster Recombinat (Shingrix) 11/30/2017, 02/02/2018   Zoster, Live 06/15/2013     Objective: Vital Signs: There were no vitals taken for this visit.   Physical Exam   Musculoskeletal Exam: ***  CDAI Exam: CDAI Score: -- Patient Global: --; Provider Global: -- Swollen: --; Tender: -- Joint Exam 05/27/2022   No joint exam has been documented for this visit   There is currently no information documented on the homunculus. Go to the Rheumatology activity and complete the homunculus joint exam.  Investigation: No additional findings.  Imaging: No results  found.  Recent Labs: Lab Results  Component Value Date   WBC 3.3 (L) 04/08/2022   HGB 12.7 04/08/2022   PLT 196.0 04/08/2022   NA 140 04/08/2022   K 3.7 04/08/2022   CL 104 04/08/2022   CO2 30 04/08/2022   GLUCOSE 87 04/08/2022   BUN 13 04/08/2022   CREATININE 0.91 04/08/2022   BILITOT 0.5 04/08/2022   ALKPHOS 67 04/08/2022   AST 16 04/08/2022   ALT 13 04/08/2022   PROT 7.7 04/08/2022   ALBUMIN 3.9 04/08/2022   CALCIUM 9.5 04/08/2022   GFRAA 72 04/23/2020   QFTBGOLDPLUS Negative 07/25/2019    Speciality Comments: PLQ Eye Exam: 01/09/2022 WNL @ [facility] OD [place] up in 1 year  Procedures:  No procedures performed Allergies: Levofloxacin, Nitrofurantoin, Penicillins, Sulfonamide derivatives, and Pantoprazole   Assessment / Plan:     Visit Diagnoses: No diagnosis found.  Orders: No orders of the defined types were placed in this encounter.  No orders of the defined types were placed in this encounter.   Face-to-face time spent with patient was *** minutes. Greater than 50% of time was spent in counseling and coordination of care.  Follow-Up Instructions: No follow-ups on file.  Earnestine Mealing, CMA  Note - This record has been created using Editor, commissioning.  Chart creation errors have been sought, but may not always  have been located. Such creation errors do not reflect on  the standard of medical care.

## 2022-05-25 NOTE — Telephone Encounter (Signed)
Patient contacted the office returning a call to try to schedule an appointment to see Dr. Corliss Skains sooner. Advised patient she could come in tomorrow at 10:00 am. Patient states she will not be able to make that time and would like to be seen still. Advised patient we would give her a return call. Patient call back number is [phone]. Please advise.

## 2022-05-25 NOTE — Telephone Encounter (Signed)
Returned call to patient. Advised patient the only two times we have available with Dr. Corliss Skains within the next few weeks are 05/26/2022 at 10:00 am and 05/27/2022 at 8:00 am. Patient states she has a graduation to attend tomorrow and will not to be able to attend tomorrow. Patient then states Wednesday is her birthday and she does not do anything on her birthday.  Patient advised again that these are the only two appointment times available with Dr. Corliss Skains. Patient advised if she is unable to make these times then she will need to see her PCP or ortho for an evaluation. Patient refuses to call PCP or ortho stating the only person she wants to see is Dr. Corliss Skains. Patient advised if this this the case then she will have to been seen at one of the two available appointment slots. Patient reluctantly scheduled on 05/27/2022 at 8:00 am. Patient states "I want to warn you I may be a little late. I live about an hour away." I advised the patient if she is more than 15 minutes late we would not be able to see her. She then states "y'all make things so difficult." I explained this is office policy and everyone has to follow this.

## 2022-05-26 ENCOUNTER — Telehealth: Payer: Self-pay

## 2022-05-26 ENCOUNTER — Encounter: Payer: Self-pay | Admitting: *Deleted

## 2022-05-26 NOTE — Telephone Encounter (Signed)
Spoke to pt, informed her of recommendations. She voiced understanding.  

## 2022-05-26 NOTE — Telephone Encounter (Signed)
Pt called stating that when she wipes after having a bm there is a spot of blood on the tissue, pt states that she has to strain at times to produce a bm. Pt is wanting to know if she should have that looked at. Please advise.

## 2022-05-27 ENCOUNTER — Ambulatory Visit: Payer: 59 | Attending: Rheumatology | Admitting: Rheumatology

## 2022-05-27 ENCOUNTER — Encounter: Payer: Self-pay | Admitting: Rheumatology

## 2022-05-27 VITALS — BP 163/77 | HR 51 | Resp 16 | Ht 69.0 in | Wt 185.2 lb

## 2022-05-27 DIAGNOSIS — Z8639 Personal history of other endocrine, nutritional and metabolic disease: Secondary | ICD-10-CM

## 2022-05-27 DIAGNOSIS — Z79899 Other long term (current) drug therapy: Secondary | ICD-10-CM

## 2022-05-27 DIAGNOSIS — M19042 Primary osteoarthritis, left hand: Secondary | ICD-10-CM

## 2022-05-27 DIAGNOSIS — M19041 Primary osteoarthritis, right hand: Secondary | ICD-10-CM | POA: Diagnosis not present

## 2022-05-27 DIAGNOSIS — M7062 Trochanteric bursitis, left hip: Secondary | ICD-10-CM

## 2022-05-27 DIAGNOSIS — M3501 Sicca syndrome with keratoconjunctivitis: Secondary | ICD-10-CM

## 2022-05-27 DIAGNOSIS — Z9011 Acquired absence of right breast and nipple: Secondary | ICD-10-CM

## 2022-05-27 DIAGNOSIS — Z8669 Personal history of other diseases of the nervous system and sense organs: Secondary | ICD-10-CM

## 2022-05-27 DIAGNOSIS — Z8679 Personal history of other diseases of the circulatory system: Secondary | ICD-10-CM

## 2022-05-27 DIAGNOSIS — M7061 Trochanteric bursitis, right hip: Secondary | ICD-10-CM | POA: Diagnosis not present

## 2022-05-27 DIAGNOSIS — M170 Bilateral primary osteoarthritis of knee: Secondary | ICD-10-CM

## 2022-05-27 DIAGNOSIS — M353 Polymyalgia rheumatica: Secondary | ICD-10-CM

## 2022-05-27 DIAGNOSIS — M5030 Other cervical disc degeneration, unspecified cervical region: Secondary | ICD-10-CM

## 2022-05-27 DIAGNOSIS — Z8719 Personal history of other diseases of the digestive system: Secondary | ICD-10-CM

## 2022-05-27 DIAGNOSIS — M67449 Ganglion, unspecified hand: Secondary | ICD-10-CM

## 2022-05-27 DIAGNOSIS — M19072 Primary osteoarthritis, left ankle and foot: Secondary | ICD-10-CM

## 2022-05-27 DIAGNOSIS — M19071 Primary osteoarthritis, right ankle and foot: Secondary | ICD-10-CM | POA: Diagnosis not present

## 2022-05-27 DIAGNOSIS — Z853 Personal history of malignant neoplasm of breast: Secondary | ICD-10-CM | POA: Diagnosis not present

## 2022-05-27 DIAGNOSIS — M8589 Other specified disorders of bone density and structure, multiple sites: Secondary | ICD-10-CM

## 2022-05-27 MED ORDER — TRIAMCINOLONE ACETONIDE 40 MG/ML IJ SUSP
40.0000 mg | INTRAMUSCULAR | Status: AC | PRN
Start: 2022-05-27 — End: 2022-05-27
  Administered 2022-05-27: 40 mg via INTRA_ARTICULAR

## 2022-05-27 MED ORDER — LIDOCAINE HCL 1 % IJ SOLN
1.5000 mL | INTRAMUSCULAR | Status: AC | PRN
Start: 2022-05-27 — End: 2022-05-27
  Administered 2022-05-27: 1.5 mL

## 2022-05-27 NOTE — Patient Instructions (Addendum)
Standing Labs We placed an order today for your standing lab work.   Please have your standing labs drawn in August  Please have your labs drawn 2 weeks prior to your appointment so that the provider can discuss your lab results at your appointment, if possible.  Please note that you may see your imaging and lab results in MyChart before we have reviewed them. We will contact you once all results are reviewed. Please allow our office up to 72 hours to thoroughly review all of the results before contacting the office for clarification of your results.  WALK-IN LAB HOURS  Monday through Thursday from 8:00 am -12:30 pm and 1:00 pm-5:00 pm and Friday from 8:00 am-12:00 pm.  Patients with office visits requiring labs will be seen before walk-in labs.  You may encounter longer than normal wait times. Please allow additional time. Wait times may be shorter on  Monday and Thursday afternoons.  We do not book appointments for walk-in labs. We appreciate your patience and understanding with our staff.   Labs are drawn by [facility]. Please bring your co-pay at the time of your lab draw.  You may receive a bill from [facility] for your lab work.  Please note if you are on Hydroxychloroquine and and an order has been placed for a Hydroxychloroquine level,  you will need to have it drawn 4 hours or more after your last dose.  If you wish to have your labs drawn at another location, please call the office 24 hours in advance so we can fax the orders.  The office is located at [address], [place], [place] [address]   If you have any questions regarding directions or hours of operation,  please call [phone].   As a reminder, please drink plenty of water prior to coming for your lab work. Thanks!   Vaccines You are taking a medication(s) that can suppress your immune system.  The following immunizations are recommended: Flu annually Covid-19  Td/Tdap (tetanus, diphtheria, pertussis) every  10 years Pneumonia (Prevnar 15 then Pneumovax 23 at least 1 year apart.  Alternatively, can take Prevnar 20 without needing additional dose) Shingrix: 2 doses from 4 weeks to 6 months apart  Please check with your PCP to make sure you are up to date.   Iliotibial Band Syndrome Rehab Ask your health care provider which exercises are safe for you. Do exercises exactly as told by your health care provider and adjust them as directed. It is normal to feel mild stretching, pulling, tightness, or discomfort as you do these exercises. Stop right away if you feel sudden pain or your pain gets significantly worse. Do not begin these exercises until told by your health care provider. Stretching and range-of-motion exercises These exercises warm up your muscles and joints and improve the movement and flexibility of your hip and pelvis. Quadriceps stretch, prone  Lie on your abdomen (prone position) on a firm surface, such as a bed or padded floor. Bend your left / right knee and reach back to hold your ankle or pant leg. If you cannot reach your ankle or pant leg, loop a belt around your foot and grab the belt instead. Gently pull your heel toward your buttocks. Your knee should not slide out to the side. You should feel a stretch in the front of your thigh and knee (quadriceps). Hold this position for __________ seconds. Repeat __________ times. Complete this exercise __________ times a day. Iliotibial band stretch An iliotibial  band is a strong band of muscle tissue that runs from the outer side of your hip to the outer side of your thigh and knee. Lie on your side with your left / right leg in the top position. Bend both of your knees and grab your left / right ankle. Stretch out your bottom arm to help you balance. Slowly bring your top knee back so your thigh goes behind your trunk. Slowly lower your top leg toward the floor until you feel a gentle stretch on the outside of your left / right hip and  thigh. If you do not feel a stretch and your knee will not fall farther, place the heel of your other foot on top of your knee and pull your knee down toward the floor with your foot. Hold this position for __________ seconds. Repeat __________ times. Complete this exercise __________ times a day. Strengthening exercises These exercises build strength and endurance in your hip and pelvis. Endurance is the ability to use your muscles for a long time, even after they get tired. Straight leg raises, side-lying This exercise strengthens the muscles that rotate the leg at the hip and move it away from your body (hip abductors). Lie on your side with your left / right leg in the top position. Lie so your head, shoulder, hip, and knee line up. You may bend your bottom knee to help you balance. Roll your hips slightly forward so your hips are stacked directly over each other and your left / right knee is facing forward. Tense the muscles in your outer thigh and lift your top leg 4-6 inches (10-15 cm). Hold this position for __________ seconds. Slowly lower your leg to return to the starting position. Let your muscles relax completely before doing another repetition. Repeat __________ times. Complete this exercise __________ times a day. Leg raises, prone This exercise strengthens the muscles that move the hips backward (hip extensors). Lie on your abdomen (prone position) on your bed or a firm surface. You can put a pillow under your hips if that is more comfortable for your lower back. Bend your left / right knee so your foot is straight up in the air. Squeeze your buttocks muscles and lift your left / right thigh off the bed. Do not let your back arch. Tense your thigh muscle as hard as you can without increasing any knee pain. Hold this position for __________ seconds. Slowly lower your leg to return to the starting position and allow it to relax completely. Repeat __________ times. Complete this  exercise __________ times a day. Hip hike Stand sideways on a bottom step. Stand on your left / right leg with your other foot unsupported next to the step. You can hold on to a railing or wall for balance if needed. Keep your knees straight and your torso square. Then lift your left / right hip up toward the ceiling. Slowly let your left / right hip lower toward the floor, past the starting position. Your foot should get closer to the floor. Do not lean or bend your knees. Repeat __________ times. Complete this exercise __________ times a day. This information is not intended to replace advice given to you by your health care provider. Make sure you discuss any questions you have with your health care provider. Document Revised: 03/15/2019 Document Reviewed: 03/15/2019 Elsevier Patient Education  2023 [employer].

## 2022-06-11 IMAGING — MG DIGITAL SCREENING UNILAT LEFT W/ TOMO W/ CAD
4 series · 4 of 12 positions shown · non-contrast
Comparison: Previous exam(s).

ACR Breast Density Category a: The breast tissue is almost entirely
fatty.

CLINICAL DATA: Screening.

EXAM:
DIGITAL SCREENING UNILATERAL LEFT MAMMOGRAM WITH CAD AND
TOMOSYNTHESIS
TECHNIQUE: Left screening digital craniocaudal and mediolateral oblique
mammograms were obtained. Left screening digital breast
tomosynthesis was performed. The images were evaluated with
computer-aided detection.

[L CC synth-2D]
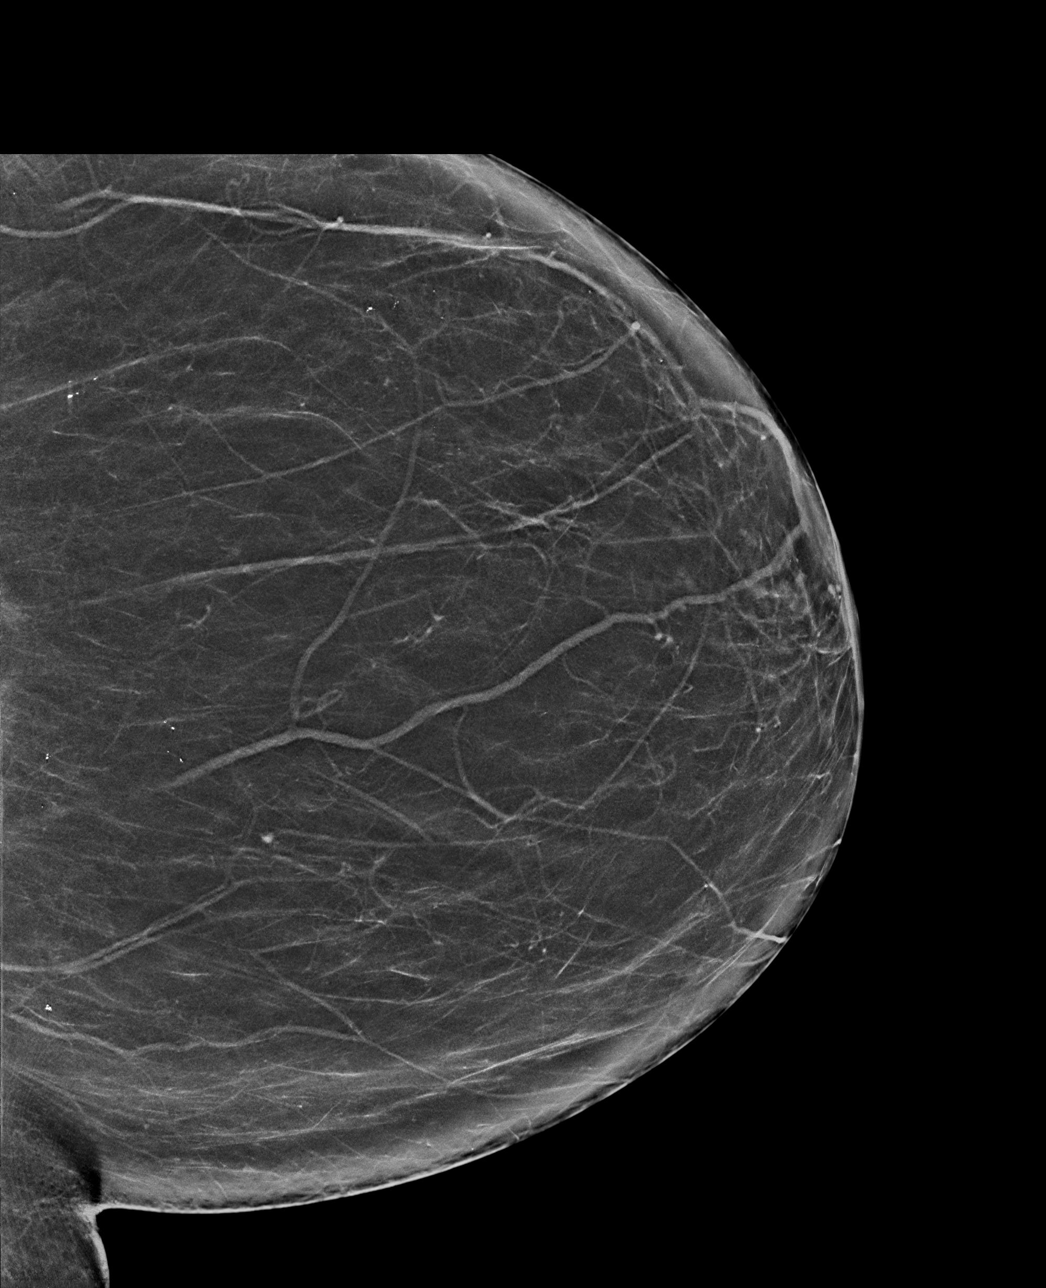

[L MLO synth-2D]
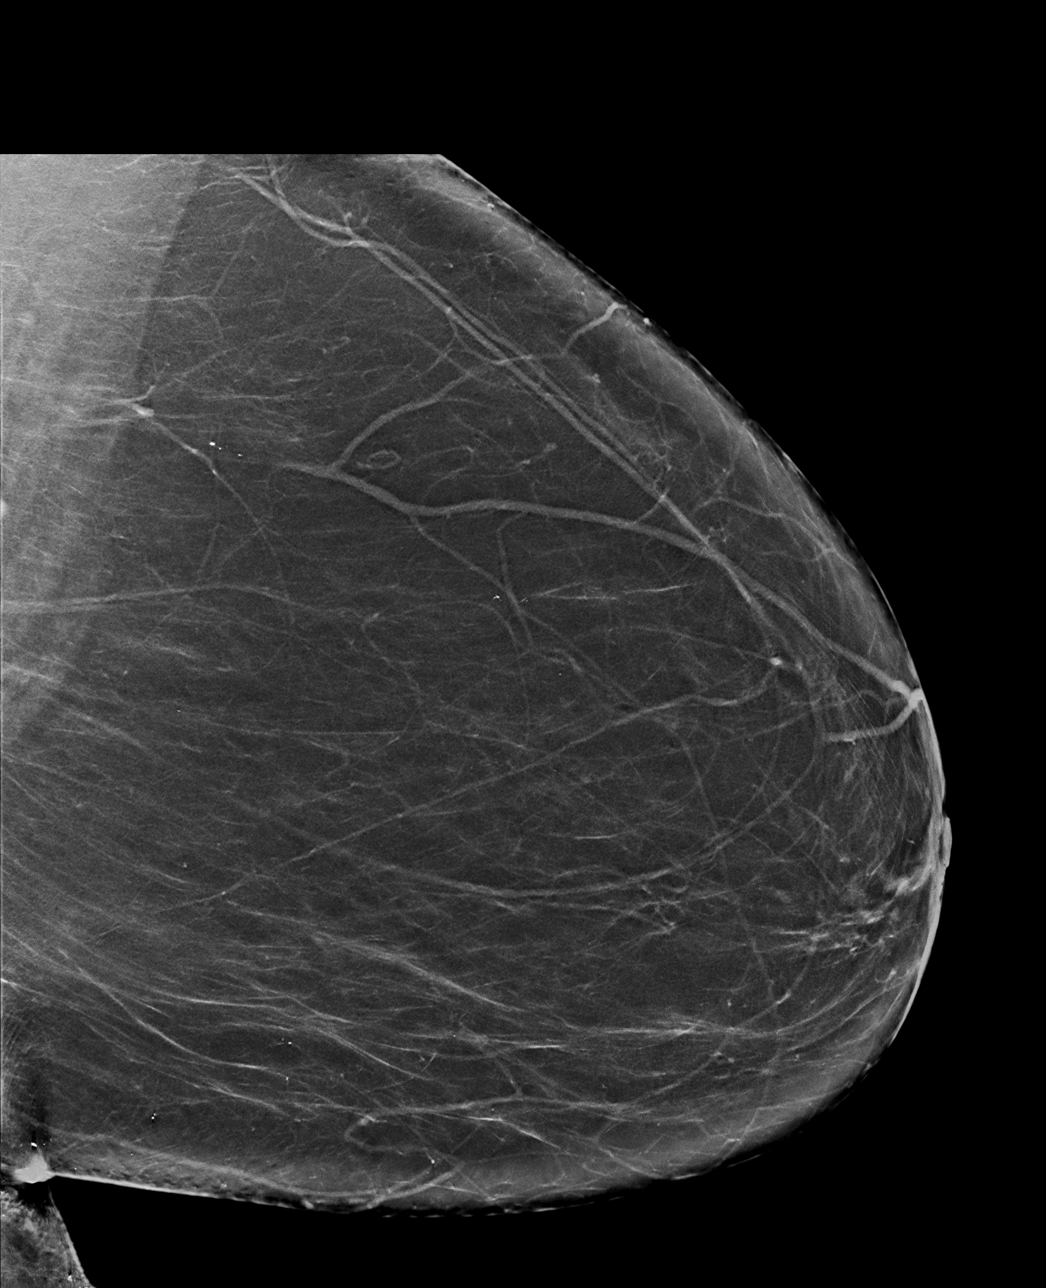

[L MLO tomo · tomo slice 39/78.0]
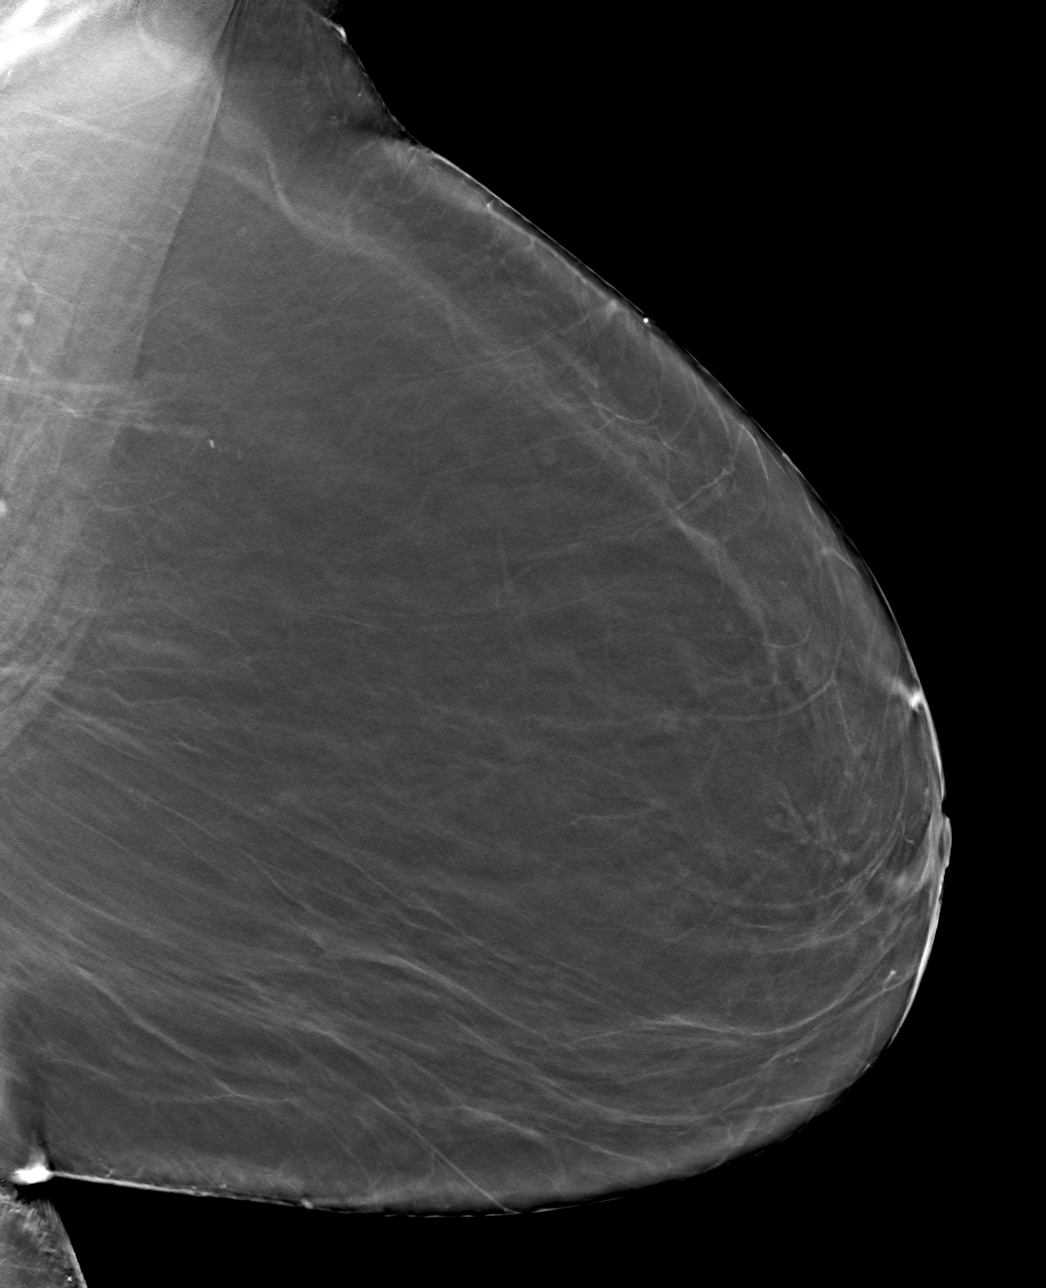

[L CC tomo · tomo slice 35/70.0]
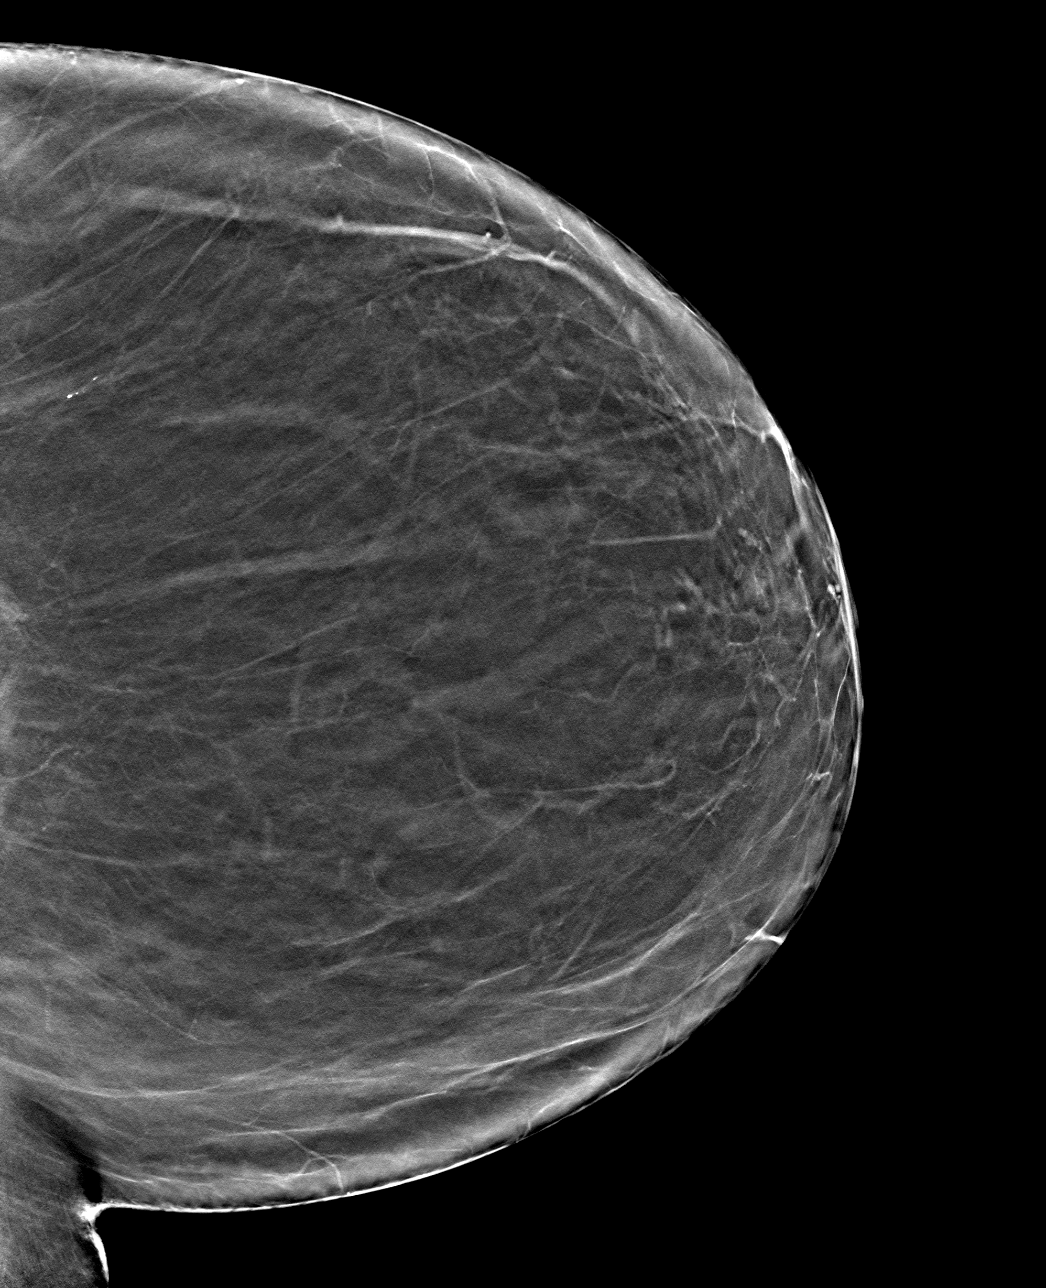

[4 of 12 positions shown; findings below may reference images not displayed]

FINDINGS: The patient has had a right mastectomy. There are no findings
suspicious for malignancy.
IMPRESSION: No mammographic evidence of malignancy. A result letter of this
screening mammogram will be mailed directly to the patient.

RECOMMENDATION:
Screening mammogram in one year.  (Code:1M-9-PIM)

BI-RADS CATEGORY  1: Negative.

## 2022-06-16 ENCOUNTER — Other Ambulatory Visit: Payer: Self-pay | Admitting: Rheumatology

## 2022-06-16 ENCOUNTER — Other Ambulatory Visit: Payer: Self-pay | Admitting: Internal Medicine

## 2022-06-16 NOTE — Telephone Encounter (Signed)
Last Fill: 03/19/2022  Eye exam: 01/09/2022   Labs: 04/08/2022 WBC 3.3  Next Visit: 11/27/2022  Last Visit: 05/27/2022  ZO:[id number] syndrome with keratoconjunctivitis sicca   Current Dose per office note on 05/27/2022: Plaquenil 200 mg 1 tablet by mouth daily.   Okay to refill Plaquenil?

## 2022-06-18 DIAGNOSIS — L84 Corns and callosities: Secondary | ICD-10-CM | POA: Diagnosis not present

## 2022-06-18 DIAGNOSIS — B351 Tinea unguium: Secondary | ICD-10-CM | POA: Diagnosis not present

## 2022-06-18 DIAGNOSIS — M79676 Pain in unspecified toe(s): Secondary | ICD-10-CM | POA: Diagnosis not present

## 2022-06-18 DIAGNOSIS — E1142 Type 2 diabetes mellitus with diabetic polyneuropathy: Secondary | ICD-10-CM | POA: Diagnosis not present

## 2022-06-19 ENCOUNTER — Encounter: Payer: Self-pay | Admitting: Internal Medicine

## 2022-06-24 ENCOUNTER — Ambulatory Visit (INDEPENDENT_AMBULATORY_CARE_PROVIDER_SITE_OTHER): Payer: 59 | Admitting: Internal Medicine

## 2022-06-24 ENCOUNTER — Encounter: Payer: Self-pay | Admitting: Internal Medicine

## 2022-06-24 VITALS — BP 136/70 | HR 65 | Temp 98.0°F | Ht 69.0 in | Wt 182.0 lb

## 2022-06-24 DIAGNOSIS — R109 Unspecified abdominal pain: Secondary | ICD-10-CM

## 2022-06-24 DIAGNOSIS — K219 Gastro-esophageal reflux disease without esophagitis: Secondary | ICD-10-CM | POA: Diagnosis not present

## 2022-06-24 DIAGNOSIS — I10 Essential (primary) hypertension: Secondary | ICD-10-CM | POA: Diagnosis not present

## 2022-06-24 DIAGNOSIS — R350 Frequency of micturition: Secondary | ICD-10-CM

## 2022-06-24 DIAGNOSIS — E1140 Type 2 diabetes mellitus with diabetic neuropathy, unspecified: Secondary | ICD-10-CM

## 2022-06-24 LAB — CBC WITH DIFFERENTIAL/PLATELET
Basophils Absolute: 0 10*3/uL (ref 0.0–0.1)
Basophils Relative: 0.5 % (ref 0.0–3.0)
Eosinophils Absolute: 0 10*3/uL (ref 0.0–0.7)
Eosinophils Relative: 0.4 % (ref 0.0–5.0)
HCT: 38.8 % (ref 36.0–46.0)
Hemoglobin: 12.6 g/dL (ref 12.0–15.0)
Lymphocytes Relative: 28.8 % (ref 12.0–46.0)
Lymphs Abs: 1.2 10*3/uL (ref 0.7–4.0)
MCHC: 32.5 g/dL (ref 30.0–36.0)
MCV: 94.3 fl (ref 78.0–100.0)
Monocytes Absolute: 0.3 10*3/uL (ref 0.1–1.0)
Monocytes Relative: 6.5 % (ref 3.0–12.0)
Neutro Abs: 2.7 10*3/uL (ref 1.4–7.7)
Neutrophils Relative %: 63.8 % (ref 43.0–77.0)
Platelets: 171 10*3/uL (ref 150.0–400.0)
RBC: 4.12 Mil/uL (ref 3.87–5.11)
RDW: 14.3 % (ref 11.5–15.5)
WBC: 4.2 10*3/uL (ref 4.0–10.5)

## 2022-06-24 NOTE — Assessment & Plan Note (Signed)
Chronic GERD typically controlled, but did have some heartburn for the past couple of days-likely eating something-she did eat more and needs in the past couple of days which may be the cause Continue omeprazole 40 mg daily Okay to take an over-the-counter medication for couple of days to help calm it down Advised eating a bland diet

## 2022-06-24 NOTE — Assessment & Plan Note (Signed)
Chronic   Lab Results  Component Value Date   HGBA1C 6.2 04/08/2022   Sugars well controlled She brought her readings in for me to review and sugars are well-controlled Continue lifestyle control

## 2022-06-24 NOTE — Patient Instructions (Addendum)
    Eat bland for the next few days    Blood work was ordered.   The lab is on the first floor.    Medications changes include :   none     Return if symptoms worsen or fail to improve.

## 2022-06-24 NOTE — Progress Notes (Signed)
Subjective:    Patient ID: Angela Simmons, female    DOB: [birth year], 77 y.o.   MRN: [id number]      HPI [place] is here for  Chief Complaint  Patient presents with   Dizziness    Dizziness feels week like she might pass out; stomach ache (acid reflux in her throat)    Symptoms started one week ago - lightheaded, HA, abdomen pain, nausea, gerd   GERD started a couple of days ago.  Taking omeprazole daily.  She wonders if this started because she was eating a lot of uneasiness the past couple of days.  She has been consuming lots of Pepto-Bismol last couple days which is help.  Has not little better.  Lightheadedness when she moves around   Right now she feels pretty good.  Her headache is better-is only mild today.   Umbilical to suprapubic pain.  She did have a little constipation.  She did also have some nausea.  She denies any pain with urination or blood in the urine.   She states cough and shortness of breath, but both those are chronic.  She had a sore throat a couple of days ago, but it is better now.  She also states some hoarseness.   Medications and allergies reviewed with patient and updated if appropriate.  Current Outpatient Medications on File Prior to Visit  Medication Sig Dispense Refill   ACCU-CHEK GUIDE test strip USE 1 STRIP TO CHECK GLUCOSE ONCE DAILY 100 each 0   apixaban (ELIQUIS) 5 MG TABS tablet Take 1 tablet by mouth twice daily 60 tablet 5   atorvastatin (LIPITOR) 10 MG tablet Take 1 tablet by mouth once daily 180 tablet 0   benazepril (LOTENSIN) 40 MG tablet Take 1 tablet (40 mg total) by mouth daily. 90 tablet 2   Blood Glucose Monitoring Suppl (ONETOUCH VERIO) w/Device KIT USE TO TEST BLOOD SUGAR ONCE DAILY AS DIRECTED 1 kit 0   diclofenac sodium (VOLTAREN) 1 % GEL Apply 3 g to 3 large joints up to 3 times daily. (Patient taking differently: Apply 3 g topically 3 (three) times daily as needed (pain).) 3 Tube 3   diltiazem (DILT-XR) 120 MG 24  hr capsule Take 1 capsule (120 mg total) by mouth daily. 90 capsule 3   fluticasone (FLONASE) 50 MCG/ACT nasal spray Place 1 spray into both nostrils as needed for allergies or rhinitis.     hydroxychloroquine (PLAQUENIL) 200 MG tablet Take 1 tablet by mouth once daily 90 tablet 0   loratadine (CLARITIN) 10 MG tablet TAKE 1 TABLET BY MOUTH EVERY DAY 30 tablet 2   metoprolol tartrate (LOPRESSOR) 25 MG tablet Take 1 tablet (25 mg total) by mouth 2 (two) times daily. (May take an extra 25mg  as needed for palpitation.) 03/13/2022 270 tablet 3   Multiple Vitamins-Minerals (CENTRUM SILVER 50+WOMEN) TABS Take 1 tablet by mouth daily at 6 (six) AM.     Omega-3 Fatty Acids (FISH OIL) 1000 MG CAPS Take 1,000 mg by mouth daily.     omeprazole (PRILOSEC) 40 MG capsule Take 1 capsule (40 mg total) by mouth daily. 90 capsule 1   ONETOUCH DELICA LANCETS 30G MISC USE TO TEST ONCE DAILY 100 each 2   ONETOUCH VERIO test strip USE TO TEST BLOOD SUGAR ONCE DAILY 100 each 0   No current facility-administered medications on file prior to visit.    Review of Systems  Constitutional:  Negative for fever.  HENT:  Positive for  rhinorrhea, sore throat (a couple of days ago) and voice change. Negative for congestion.   Respiratory:  Positive for cough (dry - chronic - only when she lays down - goes away with cough drop) and shortness of breath (a little more than usual).   Cardiovascular:  Positive for chest pain (a little) and palpitations. Negative for leg swelling.  Gastrointestinal:  Positive for abdominal pain, constipation (a little) and nausea. Negative for diarrhea.       GERD  Genitourinary:  Positive for frequency. Negative for dysuria and hematuria.  Neurological:  Positive for light-headedness (mild today) and headaches. Negative for dizziness.       Objective:   Vitals:   06/24/22 1422  BP: (!) 140/80  Pulse: 65  Temp: 98 F (36.7 C)  SpO2: 98%   BP Readings from Last 3 Encounters:  06/24/22 (!)  140/80  05/27/22 (!) 163/77  05/13/22 131/76   Wt Readings from Last 3 Encounters:  06/24/22 182 lb (82.6 kg)  05/27/22 185 lb 3.2 oz (84 kg)  05/13/22 183 lb (83 kg)   Body mass index is 26.88 kg/m.    Physical Exam Constitutional:      General: She is not in acute distress.    Appearance: Normal appearance.  HENT:     Head: Normocephalic and atraumatic.  Eyes:     Conjunctiva/sclera: Conjunctivae normal.  Cardiovascular:     Rate and Rhythm: Normal rate and regular rhythm.     Heart sounds: Normal heart sounds.  Pulmonary:     Effort: Pulmonary effort is normal. No respiratory distress.     Breath sounds: Normal breath sounds. No wheezing.  Abdominal:     General: There is no distension.     Palpations: Abdomen is soft.     Tenderness: There is abdominal tenderness (umbical region and suprapubic region-mild). There is no guarding or rebound.  Musculoskeletal:     Cervical back: Neck supple.     Right lower leg: No edema.     Left lower leg: No edema.  Lymphadenopathy:     Cervical: No cervical adenopathy.  Skin:    General: Skin is warm and dry.     Findings: No rash.  Neurological:     Mental Status: She is alert. Mental status is at baseline.  Psychiatric:        Mood and Affect: Mood normal.        Behavior: Behavior normal.            Assessment & Plan:    See Problem List for Assessment and Plan of chronic medical problems.

## 2022-06-24 NOTE — Assessment & Plan Note (Signed)
Chronic Blood pressure well controlled here and she did bring numbers from home and is very well-controlled at home CMP Continue benazepril 40 mg daily, diltiazem 120 mg daily, metoprolol 25 mg twice daily

## 2022-06-24 NOTE — Assessment & Plan Note (Addendum)
She has some mid and lower abdominal pain in her central abdomen-slightly tender to palpation, no rebound or guarding She did have some constipation recently but could be the cause Check CBC, CMP Will rule out UTI with UA, urine culture She has had similar symptoms in the past no cause was found Seems to be getting better Advised bland diet

## 2022-06-24 NOTE — Assessment & Plan Note (Signed)
Has some mild urinary frequency-not too impressive, would be abdominal pain need to rule out UTI Denies any dysuria or hematuria, no fever Check UA, urine culture

## 2022-06-25 ENCOUNTER — Encounter: Payer: Self-pay | Admitting: Internal Medicine

## 2022-06-25 LAB — COMPREHENSIVE METABOLIC PANEL
ALT: 19 U/L (ref 0–35)
AST: 21 U/L (ref 0–37)
Albumin: 3.9 g/dL (ref 3.5–5.2)
Alkaline Phosphatase: 58 U/L (ref 39–117)
BUN: 19 mg/dL (ref 6–23)
CO2: 25 mEq/L (ref 19–32)
Calcium: 9.5 mg/dL (ref 8.4–10.5)
Chloride: 102 mEq/L (ref 96–112)
Creatinine, Ser: 0.98 mg/dL (ref 0.40–1.20)
GFR: 56.24 mL/min — ABNORMAL LOW (ref >60.00)
Glucose, Bld: 77 mg/dL (ref 70–99)
Potassium: 4.2 mEq/L (ref 3.5–5.1)
Sodium: 139 mEq/L (ref 135–145)
Total Bilirubin: 0.5 mg/dL (ref 0.2–1.2)
Total Protein: 7.5 g/dL (ref 6.0–8.3)

## 2022-06-25 LAB — URINE CULTURE: Result:: NO GROWTH

## 2022-06-25 LAB — URINALYSIS, ROUTINE W REFLEX MICROSCOPIC
Bilirubin Urine: NEGATIVE
Hgb urine dipstick: NEGATIVE
Ketones, ur: NEGATIVE
Leukocytes,Ua: NEGATIVE
Nitrite: NEGATIVE
RBC / HPF: NONE SEEN (ref 0–?)
Specific Gravity, Urine: 1.01 (ref 1.000–1.030)
Total Protein, Urine: NEGATIVE
Urine Glucose: NEGATIVE
Urobilinogen, UA: 0.2 (ref 0.0–1.0)
pH: 6 (ref 5.0–8.0)

## 2022-06-25 LAB — MICROALBUMIN / CREATININE URINE RATIO
Creatinine,U: 47.3 mg/dL
Microalb Creat Ratio: 1.5 mg/g (ref 0.0–30.0)
Microalb, Ur: <0.7 mg/dL (ref 0.0–1.9)

## 2022-07-10 ENCOUNTER — Ambulatory Visit: Payer: Medicare Other | Admitting: Rheumatology

## 2022-07-14 ENCOUNTER — Other Ambulatory Visit: Payer: Self-pay | Admitting: Internal Medicine

## 2022-07-15 ENCOUNTER — Other Ambulatory Visit: Payer: Self-pay | Admitting: Cardiology

## 2022-07-15 NOTE — Telephone Encounter (Signed)
Prescription refill request for Eliquis received. Indication: A Flutter Last office visit: 03/13/22  Dominga Ferry MD Scr: 0.98 on 06/24/22  Epic Age: 77 Weight: 82.7kg  Based on above findings Eliquis 5mg  twice daily is the appropriate dose.  Refill approved.

## 2022-08-04 ENCOUNTER — Ambulatory Visit: Payer: Self-pay | Admitting: Rheumatology

## 2022-08-14 DIAGNOSIS — C50911 Malignant neoplasm of unspecified site of right female breast: Secondary | ICD-10-CM | POA: Diagnosis not present

## 2022-08-20 ENCOUNTER — Ambulatory Visit (INDEPENDENT_AMBULATORY_CARE_PROVIDER_SITE_OTHER): Payer: 59

## 2022-08-20 VITALS — Ht 69.5 in | Wt 184.6 lb

## 2022-08-20 DIAGNOSIS — Z0000 Encounter for general adult medical examination without abnormal findings: Secondary | ICD-10-CM | POA: Diagnosis not present

## 2022-08-20 DIAGNOSIS — Z5982 Transportation insecurity: Secondary | ICD-10-CM | POA: Diagnosis not present

## 2022-08-20 MED ORDER — BENAZEPRIL HCL 40 MG PO TABS: 40.0000 mg | ORAL_TABLET | Freq: Every day | ORAL | 2 refills | Status: DC

## 2022-08-20 MED ORDER — APIXABAN 5 MG PO TABS: 5.0000 mg | ORAL_TABLET | Freq: Two times a day (BID) | ORAL | 5 refills | Status: DC

## 2022-08-20 NOTE — Patient Instructions (Signed)
Angela Simmons , Thank you for taking time to come for your Medicare Wellness Visit. I appreciate your ongoing commitment to your health goals. Please review the following plan we discussed and let me know if I can assist you in the future.   Referrals/Orders/Follow-Ups/Clinician Recommendations: Remember that you are due for Tetanus vaccine and Flu and you can get those done at your pharmacy or you can get Flu shot here during your up coming office visit with Dr. Lawerance Bach.  Also remember to call and schedule an appointment for your Bone Density screening, ([facility]) [phone].  Each day, aim for 6 glasses of water, plenty of protein in your diet and try to get up and walk/ stretch every hour for 5-10 minutes at a time.    This is a list of the screening recommended for you and due dates:  Health Maintenance  Topic Date Due   DTaP/Tdap/Td vaccine (2 - Tdap) 07/04/2013   COVID-19 Vaccine (3 - Moderna risk series) 05/11/2019   Complete foot exam   07/05/2022   Flu Shot  08/20/2022   DEXA scan (bone density measurement)  08/27/2022   Hemoglobin A1C  10/09/2022   Eye exam for diabetics  01/10/2023   Yearly kidney function blood test for diabetes  06/24/2023   Yearly kidney health urinalysis for diabetes  06/24/2023   Medicare Annual Wellness Visit  08/20/2023   Pneumonia Vaccine  Completed   Hepatitis C Screening  Completed   Zoster (Shingles) Vaccine  Completed   HPV Vaccine  Aged Out   Colon Cancer Screening  Discontinued    Advanced directives: (Copy Requested) Please bring a copy of your health care power of attorney and living will to the office to be added to your chart at your convenience.  Next Medicare Annual Wellness Visit scheduled for next year: No  Preventive Care 10 Years and Older, Female Preventive care refers to lifestyle choices and visits with your health care provider that can promote health and wellness. What does preventive care  include? A yearly physical exam. This is also called an annual well check. Dental exams once or twice a year. Routine eye exams. Ask your health care provider how often you should have your eyes checked. Personal lifestyle choices, including: Daily care of your teeth and gums. Regular physical activity. Eating a healthy diet. Avoiding tobacco and drug use. Limiting alcohol use. Practicing safe sex. Taking low-dose aspirin every day. Taking vitamin and mineral supplements as recommended by your health care provider. What happens during an annual well check? The services and screenings done by your health care provider during your annual well check will depend on your age, overall health, lifestyle risk factors, and family history of disease. Counseling  Your health care provider may ask you questions about your: Alcohol use. Tobacco use. Drug use. Emotional well-being. Home and relationship well-being. Sexual activity. Eating habits. History of falls. Memory and ability to understand (cognition). Work and work Astronomer. Reproductive health. Screening  You may have the following tests or measurements: Height, weight, and BMI. Blood pressure. Lipid and cholesterol levels. These may be checked every 5 years, or more frequently if you are over 61 years old. Skin check. Lung cancer screening. You may have this screening every year starting at age 73 if you have a 30-pack-year history of smoking and currently smoke or have quit within the past 15 years. Fecal occult blood test (FOBT) of the stool. You may have this test every  year starting at age 35. Flexible sigmoidoscopy or colonoscopy. You may have a sigmoidoscopy every 5 years or a colonoscopy every 10 years starting at age 42. Hepatitis C blood test. Hepatitis B blood test. Sexually transmitted disease (STD) testing. Diabetes screening. This is done by checking your blood sugar (glucose) after you have not eaten for a while  (fasting). You may have this done every 1-3 years. Bone density scan. This is done to screen for osteoporosis. You may have this done starting at age 26. Mammogram. This may be done every 1-2 years. Talk to your health care provider about how often you should have regular mammograms. Talk with your health care provider about your test results, treatment options, and if necessary, the need for more tests. Vaccines  Your health care provider may recommend certain vaccines, such as: Influenza vaccine. This is recommended every year. Tetanus, diphtheria, and acellular pertussis (Tdap, Td) vaccine. You may need a Td booster every 10 years. Zoster vaccine. You may need this after age 58. Pneumococcal 13-valent conjugate (PCV13) vaccine. One dose is recommended after age 53. Pneumococcal polysaccharide (PPSV23) vaccine. One dose is recommended after age 23. Talk to your health care provider about which screenings and vaccines you need and how often you need them. This information is not intended to replace advice given to you by your health care provider. Make sure you discuss any questions you have with your health care provider. Document Released: 02/01/2015 Document Revised: 09/25/2015 Document Reviewed: 11/06/2014 Elsevier Interactive Patient Education  2017 [employer].  Fall Prevention in the Home Falls can cause injuries. They can happen to people of all ages. There are many things you can do to make your home safe and to help prevent falls. What can I do on the outside of my home? Regularly fix the edges of walkways and driveways and fix any cracks. Remove anything that might make you trip as you walk through a door, such as a raised step or threshold. Trim any bushes or trees on the path to your home. Use bright outdoor lighting. Clear any walking paths of anything that might make someone trip, such as rocks or tools. Regularly check to see if handrails are loose or broken. Make sure that  both sides of any steps have handrails. Any raised decks and porches should have guardrails on the edges. Have any leaves, snow, or ice cleared regularly. Use sand or salt on walking paths during winter. Clean up any spills in your garage right away. This includes oil or grease spills. What can I do in the bathroom? Use night lights. Install grab bars by the toilet and in the tub and shower. Do not use towel bars as grab bars. Use non-skid mats or decals in the tub or shower. If you need to sit down in the shower, use a plastic, non-slip stool. Keep the floor dry. Clean up any water that spills on the floor as soon as it happens. Remove soap buildup in the tub or shower regularly. Attach bath mats securely with double-sided non-slip rug tape. Do not have throw rugs and other things on the floor that can make you trip. What can I do in the bedroom? Use night lights. Make sure that you have a light by your bed that is easy to reach. Do not use any sheets or blankets that are too big for your bed. They should not hang down onto the floor. Have a firm chair that has side arms. You can use this  for support while you get dressed. Do not have throw rugs and other things on the floor that can make you trip. What can I do in the kitchen? Clean up any spills right away. Avoid walking on wet floors. Keep items that you use a lot in easy-to-reach places. If you need to reach something above you, use a strong step stool that has a grab bar. Keep electrical cords out of the way. Do not use floor polish or wax that makes floors slippery. If you must use wax, use non-skid floor wax. Do not have throw rugs and other things on the floor that can make you trip. What can I do with my stairs? Do not leave any items on the stairs. Make sure that there are handrails on both sides of the stairs and use them. Fix handrails that are broken or loose. Make sure that handrails are as long as the stairways. Check  any carpeting to make sure that it is firmly attached to the stairs. Fix any carpet that is loose or worn. Avoid having throw rugs at the top or bottom of the stairs. If you do have throw rugs, attach them to the floor with carpet tape. Make sure that you have a light switch at the top of the stairs and the bottom of the stairs. If you do not have them, ask someone to add them for you. What else can I do to help prevent falls? Wear shoes that: Do not have high heels. Have rubber bottoms. Are comfortable and fit you well. Are closed at the toe. Do not wear sandals. If you use a stepladder: Make sure that it is fully opened. Do not climb a closed stepladder. Make sure that both sides of the stepladder are locked into place. Ask someone to hold it for you, if possible. Clearly mark and make sure that you can see: Any grab bars or handrails. First and last steps. Where the edge of each step is. Use tools that help you move around (mobility aids) if they are needed. These include: Canes. Walkers. Scooters. Crutches. Turn on the lights when you go into a dark area. Replace any light bulbs as soon as they burn out. Set up your furniture so you have a clear path. Avoid moving your furniture around. If any of your floors are uneven, fix them. If there are any pets around you, be aware of where they are. Review your medicines with your doctor. Some medicines can make you feel dizzy. This can increase your chance of falling. Ask your doctor what other things that you can do to help prevent falls. This information is not intended to replace advice given to you by your health care provider. Make sure you discuss any questions you have with your health care provider. Document Released: 11/01/2008 Document Revised: 06/13/2015 Document Reviewed: 02/09/2014 Elsevier Interactive Patient Education  2017 [employer].

## 2022-08-20 NOTE — Progress Notes (Signed)
Subjective:   Angela Simmons is a 77 y.o. female who presents for Medicare Annual (Subsequent) preventive examination.  Visit Complete: In person  Patient Medicare AWV questionnaire was completed by the patient on 08/23/2022; I have confirmed that all information answered by patient is correct and no changes since this date.  Review of Systems    Cardiac Risk Factors include: advanced age (>77men, >65 women);diabetes mellitus;hypertension, Risk factor comments: Aortic atherosclerosis, A-Fib, Osteopenia     Objective:    Today's Vitals   08/20/22 1113  Weight: 184 lb 9.6 oz (83.7 kg)  Height: 5' 9.5" (1.765 m)   Body mass index is 26.87 kg/m.     08/20/2022   11:19 AM 05/13/2022   11:31 AM 02/16/2022   12:11 PM 12/07/2019   10:11 AM 08/22/2019    4:52 PM 07/22/2019    9:40 PM 07/22/2019   10:40 AM  Advanced Directives  Does Patient Have a Medical Advance Directive? Yes No Yes Yes Yes Yes No  Type of Estate agent of [place];Living will  Healthcare Power of [place];Living will Healthcare Power of [place];Living will Healthcare Power of [place];Living will Healthcare Power of Attorney   Does patient want to make changes to medical advance directive?   No - Guardian declined   No - Patient declined   Copy of Healthcare Power of Attorney in Chart? No - copy requested  No - copy requested, Physician notified   No - copy requested   Would patient like information on creating a medical advance directive?  No - Patient declined  No - Patient declined No - Patient declined No - Patient declined     Current Medications (verified) Outpatient Encounter Medications as of 08/20/2022  Medication Sig   atorvastatin (LIPITOR) 10 MG tablet Take 1 tablet by mouth once daily   Blood Glucose Monitoring Suppl (ONETOUCH VERIO) w/Device KIT USE TO TEST BLOOD SUGAR ONCE DAILY AS DIRECTED   diclofenac sodium (VOLTAREN) 1 % GEL Apply 3 g to 3 large joints up to 3 times daily. (Patient  taking differently: Apply 3 g topically 3 (three) times daily as needed (pain).)   diltiazem (DILT-XR) 120 MG 24 hr capsule Take 1 capsule (120 mg total) by mouth daily.   fluticasone (FLONASE) 50 MCG/ACT nasal spray Place 1 spray into both nostrils as needed for allergies or rhinitis.   glucose blood (ACCU-CHEK GUIDE) test strip USE 1  ONCE DAILY   hydroxychloroquine (PLAQUENIL) 200 MG tablet Take 1 tablet by mouth once daily   loratadine (CLARITIN) 10 MG tablet TAKE 1 TABLET BY MOUTH EVERY DAY   metoprolol tartrate (LOPRESSOR) 25 MG tablet Take 1 tablet (25 mg total) by mouth 2 (two) times daily. (May take an extra 25mg  as needed for palpitation.) 03/13/2022   Multiple Vitamins-Minerals (CENTRUM SILVER 50+WOMEN) TABS Take 1 tablet by mouth daily at 6 (six) AM.   Omega-3 Fatty Acids (FISH OIL) 1000 MG CAPS Take 1,000 mg by mouth daily.   omeprazole (PRILOSEC) 40 MG capsule Take 1 capsule (40 mg total) by mouth daily.   ONETOUCH DELICA LANCETS 30G MISC USE TO TEST ONCE DAILY   ONETOUCH VERIO test strip USE TO TEST BLOOD SUGAR ONCE DAILY   [DISCONTINUED] apixaban (ELIQUIS) 5 MG TABS tablet Take 1 tablet by mouth twice daily   [DISCONTINUED] benazepril (LOTENSIN) 40 MG tablet Take 1 tablet (40 mg total) by mouth daily.   apixaban (ELIQUIS) 5 MG TABS tablet Take 1 tablet (5 mg total) by  mouth 2 (two) times daily.   benazepril (LOTENSIN) 40 MG tablet Take 1 tablet (40 mg total) by mouth daily.   No facility-administered encounter medications on file as of 08/20/2022.    Allergies (verified) Levofloxacin, Nitrofurantoin, Penicillins, Sulfonamide derivatives, and Pantoprazole   History: Past Medical History:  Diagnosis Date   Breast cancer (HCC) 1990s   right breast   Collagen vascular disease (HCC)    Diabetes mellitus without complication (HCC)    GERD (gastroesophageal reflux disease)    HTN (hypertension)    Hypercholesterolemia    Obesity    Osteoarthritis    Past Surgical History:   Procedure Laterality Date   ABDOMIN[place] HY[place]CT[place]Y  [place]   AORTIC ARCH ANGIOGRAPHY N/A 12/07/2019   Procedure: AORTIC ARCH ANGIOGRAPHY;  Surgeon: Runell Gess, MD;  [facility]: [facility];  Service: Cardiovascular;  Laterality: N/A;   COLONOSCOPY  May 2003   Dr. Katrinka Blazing: normal   COLONOSCOPY  06/22/2011   Procedure: COLONOSCOPY;  Surgeon: West Bali, MD;  Location: [facility];  Service: Endoscopy;  Laterality: N/A;  10:30   COLONOSCOPY WITH PROPOFOL N/A 05/13/2022   Procedure: COLONOSCOPY WITH PROPOFOL;  Surgeon: Corbin Ade, MD;  Location: [facility];  Service: Endoscopy;  Laterality: N/A;  1:15 PM   LEFT HEART CATH AND CORONARY ANGIOGRAPHY N/A 12/07/2019   Procedure: LEFT HEART CATH AND CORONARY ANGIOGRAPHY;  Surgeon: Runell Gess, MD;  Location: [facility];  Service: Cardiovascular;  Laterality: N/A;   MASTECTOMY  1995   right    POLYPECTOMY  05/13/2022   Procedure: POLYPECTOMY;  Surgeon: Corbin Ade, MD;  [facility]: [facility];  Service: Endoscopy;;   Family History  Problem Relation Age of Onset   Diabetes Mother        diabetic coma   Heart failure Mother    Diabetes Father    Emphysema Father    Diabetes Sister    Hypercholesterolemia Sister    Hypercholesterolemia Brother    Diabetes Brother    Hypertension Brother    Hypercholesterolemia Brother    Diabetes Sister    Hypertension Sister    Hypercholesterolemia Sister    Hypercholesterolemia Sister    Hypercholesterolemia Sister    Diabetes Brother    Hypertension Brother    Colon cancer Neg Hx    Social History   Socioeconomic History   Marital status: Widowed    Spouse name: Not on file   Number of children: 3   Years of education: 7   Highest education level: Not on file  Occupational History   Not on file  Tobacco Use   Smoking status: Never    Passive exposure: Never   Smokeless tobacco: Never  Vaping Use   Vaping status: Never Used  Substance  and Sexual Activity   Alcohol use: No   Drug use: Never   Sexual activity: Never    Birth control/protection: Rhythm  Other Topics Concern   Not on file  Social History Narrative   Lives at home with husband   Caffeine, maybe 1 a day   Social Determinants of Health   Financial Resource Strain: Low Risk  (08/20/2022)   Overall Financial Resource Strain (CARDIA)    Difficulty of Paying Living Expenses: Not very hard  Food Insecurity: No Food Insecurity (08/20/2022)   Hunger Vital Sign    Worried About Running Out of Food in the Last Year: Never true    Ran  Out of Food in the Last Year: Never true  Transportation Needs: Unmet Transportation Needs (08/20/2022)   PRAPARE - Transportation    Lack of Transportation (Medical): Yes    Lack of Transportation (Non-Medical): Yes  Physical Activity: Insufficiently Active (08/20/2022)   Exercise Vital Sign    Days of Exercise per Week: 2 days    Minutes of Exercise per Session: 30 min  Stress: No Stress Concern Present (08/20/2022)   [employer] of Occupational Health - Occupational Stress Questionnaire    Feeling of Stress : Only a little  Social Connections: Unknown (08/20/2022)   Social Connection and Isolation Panel [NHANES]    Frequency of Communication with Friends and Family: More than three times a week    Frequency of Social Gatherings with Friends and Family: More than three times a week    Attends Religious Services: Not on file    Active Member of Clubs or Organizations: Yes    Attends Banker Meetings: 1 to 4 times per year    Marital Status: Widowed    Tobacco Counseling Counseling given: Not Answered   Clinical Intake:  Pre-visit preparation completed: Yes  Pain : No/denies pain     BMI - recorded: 26.87 Nutritional Risks: None Diabetes: Yes CBG done?: No (per patient-100) CBG resulted in Enter/ Edit results?: No Did pt. bring in CBG monitor from home?: No  How often do you need to have someone help  you when you read instructions, pamphlets, or other written materials from your doctor or pharmacy?: 1 - Never  Interpreter Needed?: No  Information entered by :: Elsbeth Yearick, RMA   Activities of Daily Living    08/20/2022   11:15 AM 08/20/2022   10:15 AM  In your present state of health, do you have any difficulty performing the following activities:  Hearing? 1 1  Vision? 0 0  Difficulty concentrating or making decisions? 0 0  Walking or climbing stairs? 1 1  Dressing or bathing? 0 0  Doing errands, shopping? 0 0  Preparing Food and eating ? N N  Using the Toilet? N N  In the past six months, have you accidently leaked urine? N Y  Do you have problems with loss of bowel control? N N  Managing your Medications? N N  Managing your Finances? N N  Housekeeping or managing your Housekeeping? N N    Patient Care Team: Pincus Sanes, MD as PCP - General (Internal Medicine) Wyline Mood Dorothe Pea, MD as PCP - Cardiology (Cardiology) Pollyann Savoy, MD as Consulting Physician (Rheumatology) Kathyrn Sheriff, [facility] (Inactive) as Pharmacist (Pharmacist) Jena Gauss Gerrit Friends, MD as Consulting Physician (Gastroenterology) Shea Evans, [name])  Indicate any recent Medical Services you may have received from other than [facility] providers in the past year (date may be approximate).     Assessment:   This is a routine wellness examination for Laquan.  Hearing/Vision screen Hearing Screening - Comments:: Some hearing loss in both ears  Vision Screening - Comments:: Wears eyeglasses  Dietary issues and exercise activities discussed:     Goals Addressed   None   Depression Screen    08/20/2022   11:24 AM 01/05/2022   10:33 AM 01/03/2021   11:02 AM 12/27/2019   12:07 PM 12/06/2018   11:06 AM 03/18/2017   12:37 PM 02/25/2017   11:09 AM  PHQ 2/9 Scores  PHQ - 2 Score 1 2 0 0 0 0 0  PHQ- 9 Score 1 5  Fall Risk    08/20/2022   11:20 AM 08/20/2022   10:15 AM 01/05/2022   11:08  AM 01/05/2022   10:33 AM 01/03/2021   11:01 AM  Fall Risk   Falls in the past year? 0 0 0 0 0  Number falls in past yr: 0  0 0 0  Injury with Fall? 0 0 0 0 0  Risk for fall due to : No Fall Risks  No Fall Risks No Fall Risks No Fall Risks  Follow up Falls prevention discussed  Falls evaluation completed Falls evaluation completed Falls evaluation completed    MEDICARE RISK AT HOME:   TIMED UP AND GO:  Was the test performed?  Yes  Length of time to ambulate 10 feet: 25 sec Gait slow and steady without use of assistive device    Cognitive Function:    02/25/2017    5:02 PM 02/21/2016   10:35 AM  MMSE - Mini Mental State Exam  Orientation to time 5 5  Orientation to Place 5 5  Registration 3 3  Attention/ Calculation 4 0  Attention/Calculation-comments  patient states she cannot spell or count  Recall 2 3  Language- name 2 objects 2 2  Language- repeat 1 1  Language- follow 3 step command 3 3  Language- read & follow direction 1 1  Write a sentence 1 1  Copy design 1 0  Total score 28 24        08/20/2022   11:20 AM  6CIT Screen  What Year? 0 points  What month? 0 points  What time? 0 points  Count back from 20 4 points  Months in reverse 4 points  Repeat phrase 4 points  Total Score 12 points    Immunizations Immunization History  Administered Date(s) Administered   Fluad Quad(high Dose 65+) 12/06/2018, 12/27/2019, 01/03/2021   Influenza Split 11/14/2010   Influenza Whole 10/18/2006, 10/31/2008, 11/06/2009   Influenza, High Dose Seasonal PF 11/22/2014, 02/21/2016, 12/18/2016, 11/05/2017   Influenza,inj,Quad PF,6+ Mos 11/14/2012, 11/23/2013   Moderna Sars-Covid-2 Vaccination 03/16/2019, 04/13/2019   Pneumococcal Conjugate-13 04/11/2014   Pneumococcal Polysaccharide-23 09/17/2011   Td 07/05/2003   Zoster Recombinant(Shingrix) 11/30/2017, 02/02/2018   Zoster, Live 06/15/2013    TDAP status: Due, Education has been provided regarding the importance of this  vaccine. Advised may receive this vaccine at local pharmacy or Health Dept. Aware to provide a copy of the vaccination record if obtained from local pharmacy or Health Dept. Verbalized acceptance and understanding.  Flu Vaccine status: Due, Education has been provided regarding the importance of this vaccine. Advised may receive this vaccine at local pharmacy or Health Dept. Aware to provide a copy of the vaccination record if obtained from local pharmacy or Health Dept. Verbalized acceptance and understanding.  Pneumococcal vaccine status: Up to date  Covid-19 vaccine status: Completed vaccines  Qualifies for Shingles Vaccine? Yes   Zostavax completed Yes   Shingrix Completed?: Yes  Screening Tests Health Maintenance  Topic Date Due   DTaP/Tdap/Td (2 - Tdap) 07/04/2013   INFLUENZA VACCINE  10/15/2022 (Originally 08/20/2022)   FOOT EXAM  10/15/2022 (Originally 07/05/2022)   DEXA SCAN  08/27/2022   HEMOGLOBIN A1C  10/09/2022   OPHTHALMOLOGY EXAM  01/10/2023   Diabetic kidney evaluation - eGFR measurement  06/24/2023   Diabetic kidney evaluation - Urine ACR  06/24/2023   Medicare Annual Wellness (AWV)  08/20/2023   Pneumonia Vaccine 84+ Years old  Completed   Hepatitis C Screening  Completed  Zoster Vaccines- Shingrix  Completed   HPV VACCINES  Aged Out   Colonoscopy  Discontinued   COVID-19 Vaccine  Discontinued    Health Maintenance  Health Maintenance Due  Topic Date Due   DTaP/Tdap/Td (2 - Tdap) 07/04/2013    Colorectal cancer screening: No longer required.   Mammogram status: Completed 04/16/2022. Repeat every year 2.  Bone Density status: Ordered 08/20/2022. Pt provided with contact info and advised to call to schedule appt.  Lung Cancer Screening: (Low Dose CT Chest recommended if Age 44-80 years, 20 pack-year currently smoking OR have quit w/in 15years.) does not qualify.   Lung Cancer Screening Referral: N/A  Additional Screening:  Hepatitis C Screening: does  qualify; Completed 08/27/2022  Vision Screening: Recommended annual ophthalmology exams for early detection of glaucoma and other disorders of the eye. Is the patient up to date with their annual eye exam?  Yes  Who is the provider or what is the name of the office in which the patient attends annual eye exams? Dr. Shea Evans If pt is not established with a provider, would they like to be referred to a provider to establish care? No .   Dental Screening: Recommended annual dental exams for proper oral hygiene  Diabetic Foot Exam: Diabetic Foot Exam: Completed 07/04/2021  Has an office visit coming up in September.  Community Resource Referral / Chronic Care Management: CRR required this visit?  Yes   CCM required this visit?  No     Plan:     I have personally reviewed and noted the following in the patient's chart:   Medical and social history Use of alcohol, tobacco or illicit drugs  Current medications and supplements including opioid prescriptions. Patient is not currently taking opioid prescriptions. Functional ability and status Nutritional status Physical activity Advanced directives List of other physicians Hospitalizations, surgeries, and ER visits in previous 12 months Vitals Screenings to include cognitive, depression, and falls Referrals and appointments  In addition, I have reviewed and discussed with patient certain preventive protocols, quality metrics, and best practice recommendations. A written personalized care plan for preventive services as well as general preventive health recommendations were provided to patient.     Yuchen Fedor L Keirra Zeimet, CMA   08/20/2022   After Visit Summary: (MyChart) Due to this being a telephonic visit, the after visit summary with patients personalized plan was offered to patient via MyChart   Nurse Notes: Patient is due for her Tdap and Flu vaccine.  She has an up coming appointment with Dr. Lawerance Bach in September and patient would like to get  Flu vaccine while she is here.   She was informed that she could get both vaccines at her pharmacy.  A referral for a DEXA was placed today.  Patient requesting refills today as well.

## 2022-08-21 ENCOUNTER — Telehealth: Payer: Self-pay | Admitting: *Deleted

## 2022-08-21 NOTE — Progress Notes (Signed)
  Care Coordination  Outreach Note  08/21/2022 Name: Angela Simmons MRN: [id number] DOB: 11/20/1945   Care Coordination Outreach Attempts: An unsuccessful telephone outreach was attempted today to offer the patient information about available care coordination services.  Follow Up Plan:  Additional outreach attempts will be made to offer the patient care coordination information and services.   Encounter Outcome:  No Answer  Burman Nieves, CCMA Care Coordination Care Guide Direct Dial: [phone]

## 2022-08-21 NOTE — Progress Notes (Signed)
  Care Coordination   Note   08/21/2022 Name: Angela Simmons MRN: [id number] DOB: March 05, 1945  Angela Simmons is a 77 y.o. year old female who sees Burns, Bobette Mo, MD for primary care. I reached out to Reubin Milan by phone today to offer care coordination services.  [name] was given information about Care Coordination services today including:   The Care Coordination services include support from the care team which includes your Nurse Coordinator, Clinical Social Worker, or Pharmacist.  The Care Coordination team is here to help remove barriers to the health concerns and goals most important to you. Care Coordination services are voluntary, and the patient may decline or stop services at any time by request to their care team member.   Care Coordination Consent Status: Patient agreed to services and verbal consent obtained.   Follow up plan:  Telephone appointment with care coordination team member scheduled for:  08/24/2022  Encounter Outcome:  Pt. Scheduled from referral   Burman Nieves, [facility] Care Coordination Care Guide Direct Dial: [phone]

## 2022-08-24 ENCOUNTER — Ambulatory Visit: Payer: Self-pay

## 2022-08-24 NOTE — Patient Instructions (Signed)
Visit Information  Thank you for taking time to visit with me today. Please don't hesitate to contact me if I can be of assistance to you.   Following are the goals we discussed today:  - Review mailed resources information   Our next appointment is by telephone on 8/14 at 11:00  Please call the care guide team at [phone] if you need to cancel or reschedule your appointment.   If you are experiencing a Mental Health or Behavioral Health Crisis or need someone to talk to, please call 1-800-273-TALK (toll free, 24 hour hotline) call 911  Patient verbalizes understanding of instructions and care plan provided today and agrees to view in MyChart. Active MyChart status and patient understanding of how to access instructions and care plan via MyChart confirmed with patient.     Bevelyn Ngo, BSW, CDP Social Worker, Certified Dementia Practitioner [name] Care Management  Care Coordination [phone]

## 2022-08-24 NOTE — Patient Outreach (Signed)
  Care Coordination   Initial Visit Note   08/24/2022 Name: Angela Simmons MRN: [id number] DOB: 1945/11/07  Angela Simmons is a 77 y.o. year old female who sees Burns, Bobette Mo, MD for primary care. I spoke with  Angela Simmons by phone today.  What matters to the patients health and wellness today?  Identify transportation resources    Goals Addressed             This Visit's Progress    Care Coordination Activities       Care Coordination Interventions: Discussed patient relies on her daughter and her sister for transportation. The patient would like alternative options for when neither are available to assist with transportation to provider appointments Performed chart review to note patient has dual coverage with [facility] and Medicaid Provided verbal and written education on patients health plan transportation benefit Discussed plan for SW to follow up with the patient over the next 14 days to assist with arranging transportation as needed to upcomming provider appointment         SDOH assessments and interventions completed:  Yes  SDOH Interventions Today    Flowsheet Row Most Recent Value  SDOH Interventions   Transportation Interventions Payor Benefit  [Pt relies on her daughter and sister for transportation. Would like a back up - education on health plan transportation]        Care Coordination Interventions:  Yes, provided   Interventions Today    Flowsheet Row Most Recent Value  Chronic Disease   Chronic disease during today's visit Diabetes, Hypertension (HTN)  General Interventions   General Interventions Discussed/Reviewed General Interventions Discussed  Education Interventions   Education Provided Provided Education  Provided Verbal Education On Insurance Plans        Follow up plan: Follow up call scheduled for 8/14    Encounter Outcome:  Pt. Visit Completed   Angela Simmons, BSW, CDP Social Worker, Certified Dementia Practitioner [facility] Care  Management  Care Coordination [phone]

## 2022-09-01 DIAGNOSIS — C50911 Malignant neoplasm of unspecified site of right female breast: Secondary | ICD-10-CM | POA: Diagnosis not present

## 2022-09-02 ENCOUNTER — Ambulatory Visit: Payer: Self-pay

## 2022-09-02 NOTE — Patient Outreach (Signed)
  Care Coordination   Follow Up Visit Note   09/02/2022 Name: VIVIAN SWARTZENTRUBER MRN: [id number] DOB: [birth year]  OCEAN VANDEVANDER is a 77 y.o. year old female who sees Burns, Bobette Mo, MD for primary care. I spoke with  Reubin Milan by phone today.  What matters to the patients health and wellness today?  Discussed transportation resource benefit    Goals Addressed             This Visit's Progress    COMPLETED: Care Coordination Activities       Care Coordination Interventions: Confirmed receipt of mailed resources Reviewed how to access health plan transportation for future needs Performed chart review to note patient has an appointment next week to see her cardiologist; patient reports her sister will transport her to this appointment Discussed plan for the patient to keep the resource information to allow her to book health plan rides as needed to future appointments Encouraged the patient to contact SW as needed          SDOH assessments and interventions completed:  No     Care Coordination Interventions:  Yes, provided   Interventions Today    Flowsheet Row Most Recent Value  Chronic Disease   Chronic disease during today's visit Diabetes, Hypertension (HTN)  General Interventions   General Interventions Discussed/Reviewed General Interventions Reviewed, Community Resources        Follow up plan: No further intervention required.   Encounter Outcome:  Pt. Visit Completed   Bevelyn Ngo, BSW, CDP Social Worker, Certified Dementia Practitioner [facility] Care Management  Care Coordination [phone]

## 2022-09-02 NOTE — Patient Instructions (Signed)
Visit Information  Thank you for taking time to visit with me today. Please don't hesitate to contact me if I can be of assistance to you.   Following are the goals we discussed today:  - Contact your health plan as needed to arrange transportation    If you are experiencing a Mental Health or Behavioral Health Crisis or need someone to talk to, please call 1-800-273-TALK (toll free, 24 hour hotline) call the [facility]: [phone]  Patient verbalizes understanding of instructions and care plan provided today and agrees to view in MyChart. Active MyChart status and patient understanding of how to access instructions and care plan via MyChart confirmed with patient.     No further follow up required: Please contact me as needed.  Bevelyn Ngo, BSW, CDP Social Worker, Certified Dementia Practitioner [facility] Care Management  Care Coordination [phone]

## 2022-09-03 ENCOUNTER — Encounter (INDEPENDENT_AMBULATORY_CARE_PROVIDER_SITE_OTHER): Payer: Self-pay

## 2022-09-09 ENCOUNTER — Ambulatory Visit: Payer: 59 | Attending: Cardiology | Admitting: Cardiology

## 2022-09-09 ENCOUNTER — Encounter: Payer: Self-pay | Admitting: Cardiology

## 2022-09-09 VITALS — BP 118/84 | HR 56 | Ht 69.0 in | Wt 186.8 lb

## 2022-09-09 DIAGNOSIS — E782 Mixed hyperlipidemia: Secondary | ICD-10-CM

## 2022-09-09 DIAGNOSIS — I4892 Unspecified atrial flutter: Secondary | ICD-10-CM | POA: Diagnosis not present

## 2022-09-09 DIAGNOSIS — I10 Essential (primary) hypertension: Secondary | ICD-10-CM

## 2022-09-09 NOTE — Progress Notes (Signed)
Clinical Summary Angela Simmons is a 77 y.o.female seen today for follow up of the following medical problems.      1. Aflutter/ Atach - noted during 07/2019 admission. Started on lopressor and eliquis at that time.  - - seen in ER 08/23/19 with palpitations, SOB. EKG at the stime shows NSR   -higher doses of dilt caused dizziness, fatigue.    06/2020 monitor showed infrequent short runs of SVT.  -seen in ER 02/16/22 with aflutter with RVR - converted to SR with IV diltiazem in ER, discharged from ER  - rare palpitations, just a few seconds - compliant with meds - no bleeding on eliquis    2. SOB 03/2021 echo: LVEF 55-60%, no WMAs, indet diastolic. Normal RV, PASP 44 - no recent symptoms.    3. History of chest pain - cardiac catheterization in 11/2019 showed a normal left system as outlined above but the RCA was unable to be engaged. Medical therapy was recommended unless she had refractory symptoms -no recent symptoms     4. HTN - home bp's 120s-130s/60s-70s - has had some white coat HTN   5. Hyperlipdiemia - she is on atorvastatin 10mg    06/2021 TC 126 TG 39 HDL 52 LDL 66 12/2021 TC 138 TG 43 HDL 51 LDL 78 03/2022 TC 140 TG 44 HDL 52 LDL 78          Past Medical History:  Diagnosis Date   Breast cancer (HCC) 1990s   right breast   Collagen vascular disease (HCC)    Diabetes mellitus without complication (HCC)    GERD (gastroesophageal reflux disease)    HTN (hypertension)    Hypercholesterolemia    Obesity    Osteoarthritis      Allergies  Allergen Reactions   Levofloxacin Hives and Itching   Nitrofurantoin Hives and Itching   Penicillins Hives and Itching   Sulfonamide Derivatives Itching   Pantoprazole Other (See Comments)    Bad dreams     Current Outpatient Medications  Medication Sig Dispense Refill   apixaban (ELIQUIS) 5 MG TABS tablet Take 1 tablet (5 mg total) by mouth 2 (two) times daily. 60 tablet 5   atorvastatin (LIPITOR) 10 MG  tablet Take 1 tablet by mouth once daily 180 tablet 0   benazepril (LOTENSIN) 40 MG tablet Take 1 tablet (40 mg total) by mouth daily. 90 tablet 2   Blood Glucose Monitoring Suppl (ONETOUCH VERIO) w/Device KIT USE TO TEST BLOOD SUGAR ONCE DAILY AS DIRECTED 1 kit 0   diclofenac sodium (VOLTAREN) 1 % GEL Apply 3 g to 3 large joints up to 3 times daily. (Patient taking differently: Apply 3 g topically 3 (three) times daily as needed (pain).) 3 Tube 3   diltiazem (DILT-XR) 120 MG 24 hr capsule Take 1 capsule (120 mg total) by mouth daily. 90 capsule 3   fluticasone (FLONASE) 50 MCG/ACT nasal spray Place 1 spray into both nostrils as needed for allergies or rhinitis.     glucose blood (ACCU-CHEK GUIDE) test strip USE 1  ONCE DAILY 100 each 1   hydroxychloroquine (PLAQUENIL) 200 MG tablet Take 1 tablet by mouth once daily 90 tablet 0   loratadine (CLARITIN) 10 MG tablet TAKE 1 TABLET BY MOUTH EVERY DAY 30 tablet 2   metoprolol tartrate (LOPRESSOR) 25 MG tablet Take 1 tablet (25 mg total) by mouth 2 (two) times daily. (May take an extra 25mg  as needed for palpitation.) 03/13/2022 270 tablet 3  Multiple Vitamins-Minerals (CENTRUM SILVER 50+WOMEN) TABS Take 1 tablet by mouth daily at 6 (six) AM.     Omega-3 Fatty Acids (FISH OIL) 1000 MG CAPS Take 1,000 mg by mouth daily.     omeprazole (PRILOSEC) 40 MG capsule Take 1 capsule (40 mg total) by mouth daily. 90 capsule 1   ONETOUCH DELICA LANCETS 30G MISC USE TO TEST ONCE DAILY 100 each 2   ONETOUCH VERIO test strip USE TO TEST BLOOD SUGAR ONCE DAILY 100 each 0   No current facility-administered medications for this visit.     Past Surgical History:  Procedure Laterality Date   ABDOMINAL HYSTERECTOMY  1993   [place], fibroids   AORTIC ARCH ANGIOGRAPHY N/A 12/07/2019   Procedure: AORTIC ARCH ANGIOGRAPHY;  Surgeon: Runell Gess, MD;  Location: [facility];  Service: Cardiovascular;  Laterality: N/A;   COLONOSCOPY  May 2003   Dr. Katrinka Blazing: normal    COLONOSCOPY  06/22/2011   Procedure: COLONOSCOPY;  Surgeon: West Bali, MD;  Location: [facility];  Service: Endoscopy;  Laterality: N/A;  10:30   COLONOSCOPY WITH PROPOFOL N/A 05/13/2022   Procedure: COLONOSCOPY WITH PROPOFOL;  Surgeon: Corbin Ade, MD;  Location: [facility];  Service: Endoscopy;  Laterality: N/A;  1:15 PM   LEFT HEART CATH AND CORONARY ANGIOGRAPHY N/A 12/07/2019   Procedure: LEFT HEART CATH AND CORONARY ANGIOGRAPHY;  Surgeon: Runell Gess, MD;  Location: [facility];  Service: Cardiovascular;  Laterality: N/A;   MASTECTOMY  1995   right    POLYPECTOMY  05/13/2022   Procedure: POLYPECTOMY;  Surgeon: Corbin Ade, MD;  Location: [facility];  Service: Endoscopy;;     Allergies  Allergen Reactions   Levofloxacin Hives and Itching   Nitrofurantoin Hives and Itching   Penicillins Hives and Itching   Sulfonamide Derivatives Itching   Pantoprazole Other (See Comments)    Bad dreams      Family History  Problem Relation Age of Onset   Diabetes Mother        diabetic coma   Heart failure Mother    Diabetes Father    Emphysema Father    Diabetes Sister    Hypercholesterolemia Sister    Hypercholesterolemia Brother    Diabetes Brother    Hypertension Brother    Hypercholesterolemia Brother    Diabetes Sister    Hypertension Sister    Hypercholesterolemia Sister    Hypercholesterolemia Sister    Hypercholesterolemia Sister    Diabetes Brother    Hypertension Brother    Colon cancer Neg Hx      Social History Angela Simmons reports that she has never smoked. She has never been exposed to tobacco smoke. She has never used smokeless tobacco. Angela Simmons reports no history of alcohol use.   Review of Systems CONSTITUTIONAL: No weight loss, fever, chills, weakness or fatigue.  HEENT: Eyes: No visual loss, blurred vision, double vision or yellow sclerae.No hearing loss, sneezing, congestion, runny nose or sore throat.  SKIN: No rash  or itching.  CARDIOVASCULAR: per hpi RESPIRATORY: No shortness of breath, cough or sputum.  GASTROINTESTINAL: No anorexia, nausea, vomiting or diarrhea. No abdominal pain or blood.  GENITOURINARY: No burning on urination, no polyuria NEUROLOGICAL: No headache, dizziness, syncope, paralysis, ataxia, numbness or tingling in the extremities. No change in bowel or bladder control.  MUSCULOSKELETAL: No muscle, back pain, joint pain or stiffness.  LYMPHATICS: No enlarged nodes. No history of splenectomy.  PSYCHIATRIC: No history  of depression or anxiety.  ENDOCRINOLOGIC: No reports of sweating, cold or heat intolerance. No polyuria or polydipsia.  Marland Kitchen   Physical Examination Today's Vitals   09/09/22 1316  BP: 118/84  Pulse: (!) 56  SpO2: 99%  Weight: 186 lb 12.8 oz (84.7 kg)  Height: 5\' 9"  (1.753 m)   Body mass index is 27.59 kg/m.  Gen: resting comfortably, no acute distress HEENT: no scleral icterus, pupils equal round and reactive, no palptable cervical adenopathy,  CV: RRR, no mrg, no jvd Resp: Clear to auscultation bilaterally GI: abdomen is soft, non-tender, non-distended, normal bowel sounds, no hepatosplenomegaly MSK: extremities are warm, no edema.  Skin: warm, no rash Neuro:  no focal deficits Psych: appropriate affect   Diagnostic Studies 05/2019 nuclear stress No diagnostic ST segment changes to indicate ischemia. No significant myocardial perfusion defects to indicate scar or ischemia. There is apical breast attenuation artifact This is a low risk study. Nuclear stress EF: 72%.   04/2019 echo IMPRESSIONS     1. Left ventricular ejection fraction, by estimation, is 60 to 65%. The  left ventricle has normal function. The left ventricle has no regional  wall motion abnormalities. There is mild left ventricular hypertrophy.  Left ventricular diastolic parameters  were normal.   2. Right ventricular systolic function is normal. The right ventricular  size is normal.  There is mildly elevated pulmonary artery systolic  pressure.   3. The mitral valve is normal in structure. Trivial mitral valve  regurgitation. No evidence of mitral stenosis.   4. The aortic valve is tricuspid. Aortic valve regurgitation is not  visualized. No aortic stenosis is present.   5. The inferior vena cava is normal in size with greater than 50%  respiratory variability, suggesting right atrial pressure of 3 mmHg.   11/2019 cath IMPRESSION: Ms. Chaparro has normal left coronary system.  She has an apparent takeoff RCA from close to the left main takeoff.  I was unable to selectively intubate this from the right radial approach.  I did use 180 cc of contrast and I aborted the procedure.  She will need to return for RCA angiography via the right femoral approach.  The sheath was removed and a TR band was placed on the right wrist to achieve patent hemostasis.  The patient left lab in stable condition.  She will be discharged home today as an outpatient.   06/2020 event monitor Rare supraventricular ectopy. Rare short runs of SVT up to 12 beats. Rare ventricular ectopy Reported episode of "fainting" correlated with sinus rhythm at 60 bpm     Patch Wear Time:  13 days and 16 hours (2022-05-18T11:46:34-0400 to 2022-06-01T04:15:38-0400)   Patient had a min HR of 41 bpm, max HR of 174 bpm, and avg HR of 55 bpm. Predominant underlying rhythm was Sinus Rhythm. 27 Supraventricular Tachycardia runs occurred, the run with the fastest interval lasting 5 beats with a max rate of 174 bpm, the longest lasting 12 beats with an avg rate of 113 bpm. Isolated SVEs were rare (<1.0%), SVE Couplets were rare (<1.0%), and SVE Triplets were rare (<1.0%). Isolated VEs were rare (<1.0%), VE Couplets were rare (<1.0%), and no VE Triplets were present. Ventricular Bigeminy was present.   03/2021 echo IMPRESSIONS     1. Left ventricular ejection fraction, by estimation, is 55 to 60%. The  left ventricle has  normal function. The left ventricle has no regional  wall motion abnormalities. There is mild asymmetric left ventricular  hypertrophy of  the basal segment. Left  ventricular diastolic parameters are indeterminate.   2. Right ventricular systolic function is normal. The right ventricular  size is normal. There is mildly elevated pulmonary artery systolic  pressure. The estimated right ventricular systolic pressure is 44.0 mmHg.   3. The mitral valve is grossly normal. Mild mitral valve regurgitation.   4. The aortic valve is tricuspid. Aortic valve regurgitation is mild.  Aortic valve sclerosis is present, with no evidence of aortic valve  stenosis.   5. The inferior vena cava is normal in size with greater than 50%  respiratory variability, suggesting right atrial pressure of 3 mmHg.   Comparison(s): Prior images reviewed side by side. LVEF remains normal  range at 55-60%. Estimated RVSP mildly elevated.         Assessment and Plan   1. Aflutter/atach/acquired thrombophilia -no symptoms, continue current meds including eliquis for stroke prevention.  - EKG today shows NSR   2. HTN - at goal, continue current meds   3. Hyperipidemia - LDL at goal, continue current meds   F/u 6 months    Antoine Poche, M.D.

## 2022-09-09 NOTE — Patient Instructions (Signed)
Medication Instructions:  Continue all current medications.   Labwork: none  Testing/Procedures: none  Follow-Up: 6 months   Any Other Special Instructions Will Be Listed Below (If Applicable).   If you need a refill on your cardiac medications before your next appointment, please call your pharmacy.  

## 2022-10-05 ENCOUNTER — Other Ambulatory Visit: Payer: Self-pay | Admitting: Physician Assistant

## 2022-10-05 NOTE — Telephone Encounter (Signed)
Last Fill: 06/16/2022  Eye exam: 01/09/2022 WNL    Labs: 06/24/2022 GFR 56.24  Next Visit: 11/27/2022  Last Visit: 05/27/2022  [id number] syndrome with keratoconjunctivitis sicca   Current Dose per office note 05/27/2022: Plaquenil 200 mg 1 tablet by mouth daily.   Okay to refill Plaquenil?

## 2022-10-14 NOTE — Progress Notes (Unsigned)
Subjective:    Patient ID: Angela Simmons, female    DOB: December 26, 1945, 77 y.o.   MRN: [id number]     HPI Rondell is here for follow up of her chronic medical problems.    Medications and allergies reviewed with patient and updated if appropriate.  Current Outpatient Medications on File Prior to Visit  Medication Sig Dispense Refill   apixaban (ELIQUIS) 5 MG TABS tablet Take 1 tablet (5 mg total) by mouth 2 (two) times daily. 60 tablet 5   atorvastatin (LIPITOR) 10 MG tablet Take 1 tablet by mouth once daily 180 tablet 0   benazepril (LOTENSIN) 40 MG tablet Take 1 tablet (40 mg total) by mouth daily. 90 tablet 2   Blood Glucose Monitoring Suppl (ONETOUCH VERIO) w/Device KIT USE TO TEST BLOOD SUGAR ONCE DAILY AS DIRECTED 1 kit 0   diclofenac sodium (VOLTAREN) 1 % GEL Apply 3 g to 3 large joints up to 3 times daily. (Patient taking differently: Apply 3 g topically 3 (three) times daily as needed (pain).) 3 Tube 3   diltiazem (DILT-XR) 120 MG 24 hr capsule Take 1 capsule (120 mg total) by mouth daily. 90 capsule 3   fluticasone (FLONASE) 50 MCG/ACT nasal spray Place 1 spray into both nostrils as needed for allergies or rhinitis.     glucose blood (ACCU-CHEK GUIDE) test strip USE 1  ONCE DAILY 100 each 1   hydroxychloroquine (PLAQUENIL) 200 MG tablet Take 1 tablet by mouth once daily 90 tablet 0   loratadine (CLARITIN) 10 MG tablet TAKE 1 TABLET BY MOUTH EVERY DAY 30 tablet 2   metoprolol tartrate (LOPRESSOR) 25 MG tablet Take 1 tablet (25 mg total) by mouth 2 (two) times daily. (May take an extra 25mg  as needed for palpitation.) 03/13/2022 270 tablet 3   Multiple Vitamins-Minerals (CENTRUM SILVER 50+WOMEN) TABS Take 1 tablet by mouth daily at 6 (six) AM.     Omega-3 Fatty Acids (FISH OIL) 1000 MG CAPS Take 1,000 mg by mouth daily.     omeprazole (PRILOSEC) 40 MG capsule Take 1 capsule (40 mg total) by mouth daily. 90 capsule 1   ONETOUCH DELICA LANCETS 30G MISC USE TO TEST ONCE DAILY 100  each 2   ONETOUCH VERIO test strip USE TO TEST BLOOD SUGAR ONCE DAILY 100 each 0   No current facility-administered medications on file prior to visit.     Review of Systems     Objective:  There were no vitals filed for this visit. BP Readings from Last 3 Encounters:  09/09/22 118/84  06/24/22 136/70  05/27/22 (!) 163/77   Wt Readings from Last 3 Encounters:  09/09/22 186 lb 12.8 oz (84.7 kg)  08/20/22 184 lb 9.6 oz (83.7 kg)  06/24/22 182 lb (82.6 kg)   There is no height or weight on file to calculate BMI.    Physical Exam     Lab Results  Component Value Date   WBC 4.2 06/24/2022   HGB 12.6 06/24/2022   HCT 38.8 06/24/2022   PLT 171.0 06/24/2022   GLUCOSE 77 06/24/2022   CHOL 140 04/08/2022   TRIG 44.0 04/08/2022   HDL 52.30 04/08/2022   LDLCALC 78 04/08/2022   ALT 19 06/24/2022   AST 21 06/24/2022   NA 139 06/24/2022   K 4.2 06/24/2022   CL 102 06/24/2022   CREATININE 0.98 06/24/2022   BUN 19 06/24/2022   CO2 25 06/24/2022   TSH 2.44 04/08/2022   HGBA1C  6.2 04/08/2022   MICROALBUR <0.7 06/24/2022     Assessment & Plan:    See Problem List for Assessment and Plan of chronic medical problems.

## 2022-10-14 NOTE — Patient Instructions (Addendum)
    Flu immunization administered today.     Blood work was ordered.   The lab is on the first floor.    Medications changes include :   none      Return in about 6 months (around 04/14/2023) for Physical Exam, Schedule DEXA-[facility].

## 2022-10-15 ENCOUNTER — Ambulatory Visit (INDEPENDENT_AMBULATORY_CARE_PROVIDER_SITE_OTHER): Payer: 59 | Admitting: Internal Medicine

## 2022-10-15 ENCOUNTER — Encounter: Payer: Self-pay | Admitting: Internal Medicine

## 2022-10-15 VITALS — BP 122/84 | HR 59 | Temp 98.0°F | Ht 69.0 in | Wt 187.0 lb

## 2022-10-15 DIAGNOSIS — E1140 Type 2 diabetes mellitus with diabetic neuropathy, unspecified: Secondary | ICD-10-CM

## 2022-10-15 DIAGNOSIS — I10 Essential (primary) hypertension: Secondary | ICD-10-CM

## 2022-10-15 DIAGNOSIS — E7849 Other hyperlipidemia: Secondary | ICD-10-CM

## 2022-10-15 DIAGNOSIS — Z23 Encounter for immunization: Secondary | ICD-10-CM

## 2022-10-15 DIAGNOSIS — M85852 Other specified disorders of bone density and structure, left thigh: Secondary | ICD-10-CM | POA: Diagnosis not present

## 2022-10-15 DIAGNOSIS — K219 Gastro-esophageal reflux disease without esophagitis: Secondary | ICD-10-CM | POA: Diagnosis not present

## 2022-10-15 DIAGNOSIS — M549 Dorsalgia, unspecified: Secondary | ICD-10-CM

## 2022-10-15 DIAGNOSIS — I480 Paroxysmal atrial fibrillation: Secondary | ICD-10-CM

## 2022-10-15 DIAGNOSIS — R350 Frequency of micturition: Secondary | ICD-10-CM | POA: Diagnosis not present

## 2022-10-15 LAB — CBC WITH DIFFERENTIAL/PLATELET
Basophils Absolute: 0 10*3/uL (ref 0.0–0.1)
Basophils Relative: 0.6 % (ref 0.0–3.0)
Eosinophils Absolute: 0 10*3/uL (ref 0.0–0.7)
Eosinophils Relative: 1 % (ref 0.0–5.0)
HCT: 37.7 % (ref 36.0–46.0)
Hemoglobin: 12.3 g/dL (ref 12.0–15.0)
Lymphocytes Relative: 32.9 % (ref 12.0–46.0)
Lymphs Abs: 1.1 10*3/uL (ref 0.7–4.0)
MCHC: 32.6 g/dL (ref 30.0–36.0)
MCV: 94 fl (ref 78.0–100.0)
Monocytes Absolute: 0.3 10*3/uL (ref 0.1–1.0)
Monocytes Relative: 7.9 % (ref 3.0–12.0)
Neutro Abs: 1.9 10*3/uL (ref 1.4–7.7)
Neutrophils Relative %: 57.6 % (ref 43.0–77.0)
Platelets: 203 10*3/uL (ref 150.0–400.0)
RBC: 4.01 Mil/uL (ref 3.87–5.11)
RDW: 13.5 % (ref 11.5–15.5)
WBC: 3.2 10*3/uL — ABNORMAL LOW (ref 4.0–10.5)

## 2022-10-15 LAB — COMPREHENSIVE METABOLIC PANEL
ALT: 15 U/L (ref 0–35)
AST: 19 U/L (ref 0–37)
Albumin: 3.8 g/dL (ref 3.5–5.2)
Alkaline Phosphatase: 63 U/L (ref 39–117)
BUN: 12 mg/dL (ref 6–23)
CO2: 29 mEq/L (ref 19–32)
Calcium: 9.3 mg/dL (ref 8.4–10.5)
Chloride: 106 mEq/L (ref 96–112)
Creatinine, Ser: 0.96 mg/dL (ref 0.40–1.20)
GFR: 57.52 mL/min — ABNORMAL LOW (ref >60.00)
Glucose, Bld: 84 mg/dL (ref 70–99)
Potassium: 3.9 mEq/L (ref 3.5–5.1)
Sodium: 142 mEq/L (ref 135–145)
Total Bilirubin: 0.5 mg/dL (ref 0.2–1.2)
Total Protein: 7.3 g/dL (ref 6.0–8.3)

## 2022-10-15 LAB — LIPID PANEL
Cholesterol: 125 mg/dL (ref 0–200)
HDL: 52.4 mg/dL (ref >39.00)
LDL Cholesterol: 64 mg/dL (ref 0–99)
NonHDL: 72.35
Total CHOL/HDL Ratio: 2
Triglycerides: 43 mg/dL (ref 0.0–149.0)
VLDL: 8.6 mg/dL (ref 0.0–40.0)

## 2022-10-15 LAB — URINALYSIS, ROUTINE W REFLEX MICROSCOPIC
Bilirubin Urine: NEGATIVE
Hgb urine dipstick: NEGATIVE
Ketones, ur: NEGATIVE
Leukocytes,Ua: NEGATIVE
Nitrite: NEGATIVE
Specific Gravity, Urine: 1.015 (ref 1.000–1.030)
Total Protein, Urine: NEGATIVE
Urine Glucose: NEGATIVE
Urobilinogen, UA: 0.2 (ref 0.0–1.0)
pH: 7 (ref 5.0–8.0)

## 2022-10-15 LAB — MICROALBUMIN / CREATININE URINE RATIO
Creatinine,U: 95.1 mg/dL
Microalb Creat Ratio: 0.7 mg/g (ref 0.0–30.0)
Microalb, Ur: <0.7 mg/dL (ref 0.0–1.9)

## 2022-10-15 LAB — HEMOGLOBIN A1C: Hgb A1c MFr Bld: 6.2 % (ref 4.6–6.5)

## 2022-10-15 NOTE — Assessment & Plan Note (Signed)
Acute Minimal  Has some left mid back pain that is likely msk but will check ua, ucx to r/o infection

## 2022-10-15 NOTE — Assessment & Plan Note (Signed)
Chronic Following with cardiology Paroxysmal atrial fibrillation On metoprolol 25 mg twice daily and Eliquis 5 mg twice daily CBC, TSH

## 2022-10-15 NOTE — Assessment & Plan Note (Signed)
Chronic Regular exercise and healthy diet encouraged Check lipid panel  Continue atorvastatin 10 mg daily

## 2022-10-15 NOTE — Assessment & Plan Note (Addendum)
Chronic GERD typically controlled - has GERD occasionally Continue omeprazole 40 mg daily

## 2022-10-15 NOTE — Assessment & Plan Note (Signed)
Acute Started about 1 week ago without obvious injury Pain has improved Pain is worse with movement, better or goes away with rest Likely musculoskeletal in nature Minimal tenderness on exam Had something similar on right side about one year ago Pain is improving and expected to continue to improve Can take Tylenol as needed She will let me know if pain does not continue to improve, resolve or worsens and then we will pursue further evaluation

## 2022-10-15 NOTE — Assessment & Plan Note (Signed)
Chronic DEXA due - ordered Encouraged regular exercise Advised taking calcium and vitamin d

## 2022-10-15 NOTE — Assessment & Plan Note (Signed)
Chronic   Lab Results  Component Value Date   HGBA1C 6.2 04/08/2022   Sugars well controlled She brought her readings in for me to review and sugars are well-controlled Check a1c Continue lifestyle control

## 2022-10-15 NOTE — Assessment & Plan Note (Signed)
Chronic Blood pressure well controlled here and she did bring numbers from home and is very well-controlled at home CMP Continue benazepril 40 mg daily, diltiazem 120 mg daily, metoprolol 25 mg twice daily

## 2022-10-16 LAB — URINE CULTURE: Result:: NO GROWTH

## 2022-10-22 ENCOUNTER — Ambulatory Visit
Admission: RE | Admit: 2022-10-22 | Discharge: 2022-10-22 | Disposition: A | Discharge status: Home / Self Care | Payer: 59 | Source: Ambulatory Visit | Attending: Internal Medicine | Admitting: Internal Medicine

## 2022-10-22 DIAGNOSIS — B351 Tinea unguium: Secondary | ICD-10-CM | POA: Diagnosis not present

## 2022-10-22 DIAGNOSIS — M85852 Other specified disorders of bone density and structure, left thigh: Secondary | ICD-10-CM

## 2022-10-22 DIAGNOSIS — E1142 Type 2 diabetes mellitus with diabetic polyneuropathy: Secondary | ICD-10-CM | POA: Diagnosis not present

## 2022-10-22 DIAGNOSIS — L84 Corns and callosities: Secondary | ICD-10-CM | POA: Diagnosis not present

## 2022-10-22 DIAGNOSIS — M79676 Pain in unspecified toe(s): Secondary | ICD-10-CM | POA: Diagnosis not present

## 2022-11-03 ENCOUNTER — Telehealth: Payer: Self-pay | Admitting: *Deleted

## 2022-11-03 ENCOUNTER — Encounter: Payer: Self-pay | Admitting: Rheumatology

## 2022-11-03 ENCOUNTER — Ambulatory Visit: Payer: 59 | Attending: Rheumatology | Admitting: Rheumatology

## 2022-11-03 VITALS — BP 166/79 | HR 54 | Resp 14 | Ht 69.0 in | Wt 186.4 lb

## 2022-11-03 DIAGNOSIS — M19071 Primary osteoarthritis, right ankle and foot: Secondary | ICD-10-CM | POA: Diagnosis not present

## 2022-11-03 DIAGNOSIS — Z9011 Acquired absence of right breast and nipple: Secondary | ICD-10-CM | POA: Diagnosis not present

## 2022-11-03 DIAGNOSIS — Z8669 Personal history of other diseases of the nervous system and sense organs: Secondary | ICD-10-CM

## 2022-11-03 DIAGNOSIS — M19042 Primary osteoarthritis, left hand: Secondary | ICD-10-CM

## 2022-11-03 DIAGNOSIS — M5030 Other cervical disc degeneration, unspecified cervical region: Secondary | ICD-10-CM

## 2022-11-03 DIAGNOSIS — Z853 Personal history of malignant neoplasm of breast: Secondary | ICD-10-CM

## 2022-11-03 DIAGNOSIS — Z8679 Personal history of other diseases of the circulatory system: Secondary | ICD-10-CM | POA: Diagnosis not present

## 2022-11-03 DIAGNOSIS — M170 Bilateral primary osteoarthritis of knee: Secondary | ICD-10-CM | POA: Diagnosis not present

## 2022-11-03 DIAGNOSIS — Z8639 Personal history of other endocrine, nutritional and metabolic disease: Secondary | ICD-10-CM

## 2022-11-03 DIAGNOSIS — Z8719 Personal history of other diseases of the digestive system: Secondary | ICD-10-CM

## 2022-11-03 DIAGNOSIS — M19041 Primary osteoarthritis, right hand: Secondary | ICD-10-CM | POA: Diagnosis not present

## 2022-11-03 DIAGNOSIS — Z79899 Other long term (current) drug therapy: Secondary | ICD-10-CM

## 2022-11-03 DIAGNOSIS — M8589 Other specified disorders of bone density and structure, multiple sites: Secondary | ICD-10-CM | POA: Diagnosis not present

## 2022-11-03 DIAGNOSIS — M7062 Trochanteric bursitis, left hip: Secondary | ICD-10-CM | POA: Diagnosis not present

## 2022-11-03 DIAGNOSIS — M19072 Primary osteoarthritis, left ankle and foot: Secondary | ICD-10-CM

## 2022-11-03 DIAGNOSIS — M3501 Sicca syndrome with keratoconjunctivitis: Secondary | ICD-10-CM

## 2022-11-03 DIAGNOSIS — M353 Polymyalgia rheumatica: Secondary | ICD-10-CM

## 2022-11-03 MED ORDER — LIDOCAINE HCL 1 % IJ SOLN
1.5000 mL | INTRAMUSCULAR | Status: AC | PRN
Start: 2022-11-03 — End: 2022-11-03
  Administered 2022-11-03: 1.5 mL

## 2022-11-03 MED ORDER — TRIAMCINOLONE ACETONIDE 40 MG/ML IJ SUSP
40.0000 mg | INTRAMUSCULAR | Status: AC | PRN
Start: 2022-11-03 — End: 2022-11-03
  Administered 2022-11-03: 40 mg via INTRA_ARTICULAR

## 2022-11-03 NOTE — Telephone Encounter (Signed)
Patient has been scheduled for an appointment on 11/03/2022 for evaluation.

## 2022-11-03 NOTE — Telephone Encounter (Signed)
Patient will need an evaluation.  She can schedule appointment here or with orthopedics.

## 2022-11-03 NOTE — Progress Notes (Signed)
Office Visit Note  Patient: Angela Simmons             Date of Birth: 04-16-1945           MRN: [id number]             PCP: Pincus Sanes, MD Referring: Pincus Sanes, MD Visit Date: 11/03/2022 Occupation: @GUAROCC @  Subjective:  Left hip pain  History of Present Illness: Angela Simmons is a 77 y.o. female with Sjogren's, polymyalgia rheumatica, osteoarthritis, degenerative disc disease, and osteopenia.  Patient returns today on an urgent basis after her last visit in May 2024.  She had a cortisone injection in May 2024.  She states the symptoms improved and recently the pain has recurred.  She states the pain started about a month ago.  She is having nocturnal pain and has difficulty sleeping on her side.  She continues to have some discomfort in her hands and feet.  She continues to have dry mouth and dry eyes.  She continues to take hydroxychloroquine to 200 mg p.o. daily.  She has intermittent discomfort in her knee joints.  Her neck is not bothersome currently.    Activities of Daily Living:  Patient reports morning stiffness for 20 minutes.   Patient Reports nocturnal pain.  Difficulty dressing/grooming: Denies Difficulty climbing stairs: Reports Difficulty getting out of chair: Reports Difficulty using hands for taps, buttons, cutlery, and/or writing: Reports  Review of Systems  Constitutional:  Positive for fatigue.  HENT:  Positive for mouth dryness. Negative for mouth sores.   Eyes:  Positive for dryness.  Respiratory:  Negative for wheezing.   Cardiovascular:  Positive for palpitations. Negative for chest pain.  Gastrointestinal:  Negative for blood in stool, constipation and diarrhea.  Endocrine: Negative for increased urination.  Genitourinary:  Negative for involuntary urination.  Musculoskeletal:  Positive for joint pain, gait problem, joint pain, myalgias, morning stiffness and myalgias. Negative for joint swelling, muscle weakness and muscle tenderness.  Skin:   Positive for hair loss. Negative for color change, rash and sensitivity to sunlight.  Allergic/Immunologic: Negative for susceptible to infections.  Neurological:  Positive for headaches. Negative for dizziness.  Hematological:  Positive for bruising/bleeding tendency. Negative for swollen glands.  Psychiatric/Behavioral:  Positive for sleep disturbance. Negative for depressed mood. The patient is not nervous/anxious.     PMFS History:  Patient Active Problem List   Diagnosis Date Noted   Vivid dream 04/08/2022   Peripheral neuropathy due to sjogren's disease 02/21/2022   Sensation of heaviness 01/05/2022   Mid back pain on left side 11/13/2021   Cervical radiculopathy due to degenerative joint disease of spine 02/13/2021   Right-sided chest pain 02/13/2021   Bilateral primary osteoarthritis of knee 02/13/2021   Right knee pain 01/03/2021   Radiculopathy 01/03/2021   Atrial flutter (HCC) 08/22/2020   Aortic atherosclerosis (HCC) 06/28/2020   Secondary hypercoagulable state (HCC) 12/26/2019   Anticoagulated 12/05/2019   PAF (paroxysmal atrial fibrillation) (HCC) 08/04/2019   DOE (dyspnea on exertion) 04/03/2019   Ankle swelling, left 04/03/2019   Hyperpigmented skin lesion 04/03/2019   Poor balance 12/06/2018   Headache 12/06/2018   Vertigo 06/07/2018   Urinary frequency 06/07/2018   Abdominal pain 06/07/2018   Herpes zoster without complication 03/23/2018   Tingling in extremities 11/26/2017   Ischial bursitis of left side 06/02/2017   Sicca syndrome (HCC) 06/23/2016   Bilateral leg edema 04/07/2016   Burning sensation of skin 04/07/2016   PMR (polymyalgia rheumatica) (HCC)  03/27/2016   Osteopenia 03/04/2016   Dysuria 02/26/2016   Autoimmune disease (HCC) 03/18/2015   Inflammatory arthritis 03/18/2015   Joint pain 11/22/2014   Overweight (BMI 25.0-29.9) 08/18/2014   Low back pain with radiation 08/18/2014   Hyperlipidemia 06/28/2014   Chest pain with moderate risk of  acute coronary syndrome 04/11/2014   Tendonitis of shoulder 08/29/2013   S/P right mastectomy 11/14/2012   Type 2 diabetes, controlled, with neuropathy (HCC) 10/05/2012   Allergic rhinitis 01/26/2011   GERD 11/10/2009   INTERSTITIAL CYSTITIS 10/31/2008   BENIGN NEOPLASM OF VULVA 02/01/2008   Essential hypertension, benign 02/08/2007   History of cancer of right breast 02/08/2007    Past Medical History:  Diagnosis Date   Breast cancer (HCC) 1990s   right breast   Collagen vascular disease (HCC)    Diabetes mellitus without complication (HCC)    GERD (gastroesophageal reflux disease)    HTN (hypertension)    Hypercholesterolemia    Obesity    Osteoarthritis     Family History  Problem Relation Age of Onset   Diabetes Mother        diabetic coma   Heart failure Mother    Diabetes Father    Emphysema Father    Diabetes Sister    Hypercholesterolemia Sister    Diabetes Sister    Hypertension Sister    Hypercholesterolemia Sister    Hypercholesterolemia Sister    Hypercholesterolemia Sister    Hypercholesterolemia Brother    Diabetes Brother    Hypertension Brother    Hypercholesterolemia Brother    Diabetes Brother    Hypertension Brother    Colon cancer Neg Hx    Past Surgical History:  Procedure Laterality Date   ABDOMINAL HYSTERECTOMY  1993   [place]   AORTIC ARCH ANGIOGRAPHY N/A 12/07/2019   Procedure: AORTIC ARCH ANGIOGRAPHY;  Surgeon: Runell Gess, MD;  Location: [facility];  Service: Cardiovascular;  Laterality: N/A;   COLONOSCOPY  May 2003   Dr. Katrinka Blazing: normal   COLONOSCOPY  06/22/2011   Procedure: COLONOSCOPY;  Surgeon: West Bali, MD;  Location: [facility];  Service: Endoscopy;  Laterality: N/A;  10:30   COLONOSCOPY WITH PROPOFOL N/A 05/13/2022   Procedure: COLONOSCOPY WITH PROPOFOL;  Surgeon: Corbin Ade, MD;  Location: [facility];  Service: Endoscopy;  Laterality: N/A;  1:15 PM   LEFT HEART CATH AND CORONARY ANGIOGRAPHY N/A  12/07/2019   Procedure: LEFT HEART CATH AND CORONARY ANGIOGRAPHY;  Surgeon: Runell Gess, MD;  Location: [facility];  Service: Cardiovascular;  Laterality: N/A;   MASTECTOMY  1995   right    POLYPECTOMY  05/13/2022   Procedure: POLYPECTOMY;  Surgeon: Corbin Ade, MD;  Location: [facility];  Service: Endoscopy;;   Social History   Social History Narrative   Lives at home with husband   Caffeine, maybe 1 a day   Immunization History  Administered Date(s) Administered   Fluad Quad(high Dose 65+) 12/06/2018, 12/27/2019, 01/03/2021   Fluad Trivalent(High Dose 65+) 10/15/2022   Influenza Split 11/14/2010   Influenza Whole 10/18/2006, 10/31/2008, 11/06/2009   Influenza, High Dose Seasonal PF 11/22/2014, 02/21/2016, 12/18/2016, 11/05/2017   Influenza,inj,Quad PF,6+ Mos 11/14/2012, 11/23/2013   Moderna Sars-Covid-2 Vaccination 03/16/2019, 04/13/2019   Pneumococcal Conjugate-13 04/11/2014   Pneumococcal Polysaccharide-23 09/17/2011   Td 07/05/2003   Zoster Recombinant(Shingrix) 11/30/2017, 02/02/2018   Zoster, Live 06/15/2013     Objective: Vital Signs: BP (!) 161/94 (BP Location: Left Arm, Patient  Position: Sitting, Cuff Size: Normal)   Pulse (!) 55   Resp 14   Ht 5\' 9"  (1.753 m)   Wt 186 lb 6.4 oz (84.6 kg)   BMI 27.53 kg/m    Physical Exam Vitals and nursing note reviewed.  Constitutional:      Appearance: She is well-developed.  HENT:     Head: Normocephalic and atraumatic.  Eyes:     Conjunctiva/sclera: Conjunctivae normal.  Cardiovascular:     Rate and Rhythm: Normal rate and regular rhythm.     Heart sounds: Normal heart sounds.  Pulmonary:     Effort: Pulmonary effort is normal.     Breath sounds: Normal breath sounds.  Abdominal:     General: Bowel sounds are normal.     Palpations: Abdomen is soft.  Musculoskeletal:     Cervical back: Normal range of motion.  Lymphadenopathy:     Cervical: No cervical adenopathy.  Skin:    General: Skin  is warm and dry.     Capillary Refill: Capillary refill takes less than 2 seconds.  Neurological:     Mental Status: She is alert and oriented to person, place, and time.  Psychiatric:        Behavior: Behavior normal.      Musculoskeletal Exam: She had limited lateral rotation of the cervical spine.  Lumbar spine was in good range of motion.  Shoulder joints, elbow joints, wrist joints, MCPs PIPs and DIPs were in good range of motion.  No synovitis was noted.  Hip joints and knee joints were in good range of motion.  She had tenderness on palpation over left trochanter and left gluteal region.  There was no tenderness over ankles or MTPs.  CDAI Exam: CDAI Score: -- Patient Global: --; Provider Global: -- Swollen: --; Tender: -- Joint Exam 11/03/2022   No joint exam has been documented for this visit   There is currently no information documented on the homunculus. Go to the Rheumatology activity and complete the homunculus joint exam.  Investigation: No additional findings.  Imaging: No results found.  Recent Labs: Lab Results  Component Value Date   WBC 3.2 (L) 10/15/2022   HGB 12.3 10/15/2022   PLT 203.0 10/15/2022   NA 142 10/15/2022   K 3.9 10/15/2022   CL 106 10/15/2022   CO2 29 10/15/2022   GLUCOSE 84 10/15/2022   BUN 12 10/15/2022   CREATININE 0.96 10/15/2022   BILITOT 0.5 10/15/2022   ALKPHOS 63 10/15/2022   AST 19 10/15/2022   ALT 15 10/15/2022   PROT 7.3 10/15/2022   ALBUMIN 3.8 10/15/2022   CALCIUM 9.3 10/15/2022   GFRAA 72 04/23/2020   QFTBGOLDPLUS Negative 07/25/2019    Speciality Comments: PLQ Eye Exam: 01/09/2022 WNL @ [facility] up in 1 year  Procedures:  Large Joint Inj: L greater trochanter on 11/03/2022 3:19 PM Indications: pain Details: 27 G 1.5 in needle, lateral approach  Arthrogram: No  Medications: 1.5 mL lidocaine 1 %; 40 mg triamcinolone acetonide 40 MG/ML Aspirate: 0 mL Outcome: tolerated well, no immediate  complications Procedure, treatment alternatives, risks and benefits explained, specific risks discussed. Consent was given by the patient. Immediately prior to procedure a time out was called to verify the correct patient, procedure, equipment, support staff and site/side marked as required. Patient was prepped and draped in the usual sterile fashion.     Allergies: Levofloxacin, Nitrofurantoin, Penicillins, Sulfonamide derivatives, and Pantoprazole   Assessment / Plan:  Visit Diagnoses: Sjogren's syndrome with keratoconjunctivitis sicca (HCC) +ANA,+SSA,RF- - sicca symptoms: Patient continues to have dry mouth and dry eyes.  She has been using over-the-counter products.  Other products were also discussed.  She takes hydroxychloroquine 200 mg daily.  Increased risk of ILD and lymphoma with Sjogren's was discussed.  She denies any shortness of breath.  Lungs were clear to auscultation.  PMR (polymyalgia rheumatica) (HCC) - PMR is in remission.  She had normal Ro weakness or tenderness.  High risk medication use - Plaquenil 200 mg 1 tablet by mouth daily. PLQ Eye Exam: 01/09/2022.  Labs obtained on October 15, 2022 white cell count was low at 3.2.  CMP was normal.  Primary osteoarthritis of both hands-she had no synovitis on examination.  She continues to have some discomfort in her hands.  Trochanteric bursitis of left hip-she gets recurrent left trochanteric bursitis.  She had left trochanteric bursa injection in May which was very helpful.  The pain has recurred now.  Patient is requesting repeat injection.  Side effects were discussed.  Left trochanteric bursa was injected as described above.  Patient tolerated the procedure well.  A handout on IT band stretches was given.  Primary osteoarthritis of both knees -she has intermittent discomfort in her knee joints.  No warmth swelling or effusion was noted.  X-rays of the right knee 02/27/2021 and results were consistent with mild to moderate  lateral compartment and moderate patellofemoral compartment osteoarthritis.  Primary osteoarthritis of both feet-proper fitting shoes were advised.  DDD (degenerative disc disease), cervical-she had some discomfort with lateral rotation of the cervical spine.  Osteopenia of multiple sites - DEXA updated on 08/26/20 ordered by Dr. Lawerance Bach: LFN -1.4.  Use of calcium rich diet and vitamin D was discussed.  Other medical problems listed as follows:  History of cancer of right breast  History of hypertension-blood pressure was elevated at 161/94.  Repeat blood pressure was 166/79.  Patient states her blood pressure at home was 133/77.  Patient states her blood pressure is always elevated when she comes to doctor's office.  She is advised to monitor blood pressure closely and follow-up with her PCP.  S/P right mastectomy  History of hyperlipidemia  History of peripheral neuropathy  History of gastroesophageal reflux (GERD)  Orders: No orders of the defined types were placed in this encounter.  No orders of the defined types were placed in this encounter.    Follow-Up Instructions: Return in about 5 months (around 04/03/2023) for Sjogren's, Osteoarthritis.   Pollyann Savoy, MD  Note - This record has been created using Animal nutritionist.  Chart creation errors have been sought, but may not always  have been located. Such creation errors do not reflect on  the standard of medical care.

## 2022-11-03 NOTE — Telephone Encounter (Signed)
Patient contacted the office stating she is having pain in her left hip. Patient describes it as a nagging, throbbing pain. Patient states it wakes her from her sleep. Patient states she does not recall an injury. Patient is taking her PLQ as prescribed.  Patient states she has been using Voltaren Gel with little relief. Patient would like to know if there is an alternative she can take to help with the pain. Call back number [phone]. Please advise.

## 2022-11-10 ENCOUNTER — Encounter: Payer: Self-pay | Admitting: Cardiology

## 2022-11-10 ENCOUNTER — Encounter: Payer: Self-pay | Admitting: Internal Medicine

## 2022-11-10 NOTE — Telephone Encounter (Signed)
Returned call to daughter Lavonda Jumbo) - states that she was trying to enroll her mother for [facility] insurance & was told that he was not going to be in network for next year.  Confirmed with Engineer, manufacturing - no issues with Dr. Verna Czech credentialing - give daughter correct name and address for [facility] [place] locations.

## 2022-11-19 DIAGNOSIS — E119 Type 2 diabetes mellitus without complications: Secondary | ICD-10-CM | POA: Diagnosis not present

## 2022-11-27 ENCOUNTER — Ambulatory Visit: Payer: 59 | Admitting: Rheumatology

## 2022-12-31 DIAGNOSIS — L84 Corns and callosities: Secondary | ICD-10-CM | POA: Diagnosis not present

## 2022-12-31 DIAGNOSIS — M79676 Pain in unspecified toe(s): Secondary | ICD-10-CM | POA: Diagnosis not present

## 2022-12-31 DIAGNOSIS — B351 Tinea unguium: Secondary | ICD-10-CM | POA: Diagnosis not present

## 2022-12-31 DIAGNOSIS — E1142 Type 2 diabetes mellitus with diabetic polyneuropathy: Secondary | ICD-10-CM | POA: Diagnosis not present

## 2023-01-04 ENCOUNTER — Ambulatory Visit: Payer: 59 | Admitting: Diagnostic Neuroimaging

## 2023-01-05 ENCOUNTER — Other Ambulatory Visit: Payer: Self-pay | Admitting: Physician Assistant

## 2023-01-05 ENCOUNTER — Other Ambulatory Visit: Payer: Self-pay | Admitting: Internal Medicine

## 2023-01-05 NOTE — Telephone Encounter (Signed)
Last Fill: 10/05/2022  Eye exam: 01/09/2022 WNL    Labs: 10/15/2022 GFR 57.52, WBC 3.2  Next Visit: 04/05/2023  Last Visit: 11/03/2022  QM:[id number] syndrome with keratoconjunctivitis sicca   Current Dose per office note 11/03/2022: hydroxychloroquine 200 mg daily   Okay to refill Plaquenil?

## 2023-01-26 ENCOUNTER — Other Ambulatory Visit: Payer: Self-pay | Admitting: Internal Medicine

## 2023-01-28 DIAGNOSIS — Z09 Encounter for follow-up examination after completed treatment for conditions other than malignant neoplasm: Secondary | ICD-10-CM | POA: Diagnosis not present

## 2023-02-15 DIAGNOSIS — L299 Pruritus, unspecified: Secondary | ICD-10-CM | POA: Diagnosis not present

## 2023-02-15 DIAGNOSIS — I8393 Asymptomatic varicose veins of bilateral lower extremities: Secondary | ICD-10-CM | POA: Diagnosis not present

## 2023-03-01 ENCOUNTER — Encounter: Payer: Self-pay | Admitting: Diagnostic Neuroimaging

## 2023-03-01 ENCOUNTER — Ambulatory Visit (INDEPENDENT_AMBULATORY_CARE_PROVIDER_SITE_OTHER): Payer: 59 | Admitting: Diagnostic Neuroimaging

## 2023-03-01 VITALS — BP 177/94 | HR 67 | Ht 69.0 in | Wt 186.0 lb

## 2023-03-01 DIAGNOSIS — G63 Polyneuropathy in diseases classified elsewhere: Secondary | ICD-10-CM | POA: Diagnosis not present

## 2023-03-01 DIAGNOSIS — M3506 Sjogren syndrome with peripheral nervous system involvement: Secondary | ICD-10-CM | POA: Diagnosis not present

## 2023-03-01 NOTE — Progress Notes (Signed)
 [facility]  PATIENT: Angela Simmons DOB: 1945/07/17  REFERRING CLINICIAN: Colene Dauphin, MD  HISTORY FROM: patient and daughter REASON FOR VISIT: follow up   HISTORICAL  CHIEF COMPLAINT:  Chief Complaint  Patient presents with   Follow-up    Pt with daughter, rm 7. Here today for follow up. Trouble with legs when driving the car. Feels the RLE is heavy. She states weakness in bilateral lower extremities R> L.     HISTORY OF PRESENT ILLNESS:   UPDATE (03/01/23, VRP): Since last visit, symptoms are progressive. More gait and balance issues. Also right knee pain is worsening. Numbness in both feet; more diff driving car and operating gas / brake.   UPDATE (02/20/22, VRP): Since last visit, doing continued numbness in toes, feet; harder to control car gas / brake pedals. Some stiffness in back, knees, ankles. Continues on hydroxychloroquine  for sjogrens.   PRIOR HPI (03/01/17): 78 year old right-handed female here for evaluation of abnormal sensation in the legs.  Patient reports at least 6 months of cold sensation in her feet and legs.  Sometimes she has burning on the top of her feet.  She has some tingling sensation in her fingers.  Symptoms have been fairly stable over the last few months.  Symptoms are intermittent.  Patient reports painful sensation a few days out of the week.  She is not taking any medication for this problem.  Patient denies any neck or low back pain.  Patient has diagnosis of diabetes, which is under fairly good control.  Last hemoglobin A1c 6.4.  Patient also under treatment for autoimmune disease, with positive ANA and sicca symptoms.  Patient also has history of polymyalgia rheumatica.  Patient has been on prednisone  in the past but has been tapered off.   REVIEW OF SYSTEMS: Full 14 system review of systems performed and negative with exception of: as per HPI.   ALLERGIES: Allergies  Allergen Reactions   Levofloxacin Hives and  Itching   Nitrofurantoin Hives and Itching   Penicillins Hives and Itching   Sulfonamide Derivatives Itching   Pantoprazole  Other (See Comments)    Bad dreams    HOME MEDICATIONS: Outpatient Medications Prior to Visit  Medication Sig Dispense Refill   ACCU-CHEK GUIDE TEST test strip USE 1 STRIP TO CHECK GLUCOSE ONCE DAILY 100 each 0   apixaban  (ELIQUIS ) 5 MG TABS tablet Take 1 tablet (5 mg total) by mouth 2 (two) times daily. 60 tablet 5   atorvastatin  (LIPITOR) 10 MG tablet Take 1 tablet by mouth once daily 180 tablet 0   benazepril  (LOTENSIN ) 40 MG tablet Take 1 tablet (40 mg total) by mouth daily. 90 tablet 2   Blood Glucose Monitoring Suppl (ONETOUCH VERIO) w/Device KIT USE TO TEST BLOOD SUGAR ONCE DAILY AS DIRECTED 1 kit 0   diclofenac  sodium (VOLTAREN ) 1 % GEL Apply 3 g to 3 large joints up to 3 times daily. (Patient taking differently: Apply 3 g topically 3 (three) times daily as needed (pain).) 3 Tube 3   diltiazem  (DILT-XR) 120 MG 24 hr capsule Take 1 capsule (120 mg total) by mouth daily. 90 capsule 3   fluticasone  (FLONASE ) 50 MCG/ACT nasal spray Place 1 spray into both nostrils as needed for allergies or rhinitis.     hydroxychloroquine  (PLAQUENIL ) 200 MG tablet Take 1 tablet by mouth once daily 90 tablet 0   loratadine  (CLARITIN ) 10 MG tablet TAKE 1 TABLET BY MOUTH EVERY DAY 30 tablet 2   Magnesium 250  MG TABS Take 250 mg by mouth daily.     metoprolol  tartrate (LOPRESSOR ) 25 MG tablet Take 1 tablet (25 mg total) by mouth 2 (two) times daily. (May take an extra 25mg  as needed for palpitation.) 03/13/2022 270 tablet 3   Multiple Vitamins-Minerals (CENTRUM SILVER  50+WOMEN) TABS Take 1 tablet by mouth daily at 6 (six) AM.     Omega-3 Fatty Acids (FISH OIL) 1000 MG CAPS Take 1,000 mg by mouth daily.     omeprazole  (PRILOSEC) 40 MG capsule Take 1 capsule by mouth once daily 90 capsule 0   ONETOUCH DELICA LANCETS 30G MISC USE TO TEST ONCE DAILY 100 each 2   ONETOUCH VERIO test strip  USE TO TEST BLOOD SUGAR ONCE DAILY 100 each 0   Polyvinyl Alcohol-Povidone (REFRESH OP) Apply to eye 2 (two) times daily.     No facility-administered medications prior to visit.    PAST MEDICAL HISTORY: Past Medical History:  Diagnosis Date   Breast cancer (HCC) 1990s   right breast   Collagen vascular disease (HCC)    Diabetes mellitus without complication (HCC)    GERD (gastroesophageal reflux disease)    HTN (hypertension)    Hypercholesterolemia    Obesity    Osteoarthritis     PAST SURGICAL HISTORY: Past Surgical History:  Procedure Laterality Date   ABDOMINAL HYSTERECTOMY  [place]   [place]   AORTIC ARCH ANGIOGRAPHY N/A 12/07/2019   Procedure: AORTIC ARCH ANGIOGRAPHY;  Surgeon: Avanell Leigh, MD;  Location: [facility];  Service: Cardiovascular;  Laterality: N/A;   COLONOSCOPY  May 2003   Dr. Felipe Horton: normal   COLONOSCOPY  06/22/2011   Procedure: COLONOSCOPY;  Surgeon: Alyce Jubilee, MD;  Location: [facility];  Service: Endoscopy;  Laterality: N/A;  10:30   COLONOSCOPY WITH PROPOFOL  N/A 05/13/2022   Procedure: COLONOSCOPY WITH PROPOFOL ;  Surgeon: Suzette Espy, MD;  Location: [facility];  Service: Endoscopy;  Laterality: N/A;  1:15 PM   LEFT HEART CATH AND CORONARY ANGIOGRAPHY N/A 12/07/2019   Procedure: LEFT HEART CATH AND CORONARY ANGIOGRAPHY;  Surgeon: Avanell Leigh, MD;  Location: [facility];  Service: Cardiovascular;  Laterality: N/A;   MASTECTOMY  1995   right    POLYPECTOMY  05/13/2022   Procedure: POLYPECTOMY;  Surgeon: Suzette Espy, MD;  Location: [facility];  Service: Endoscopy;;    FAMILY HISTORY: Family History  Problem Relation Age of Onset   Diabetes Mother        diabetic coma   Heart failure Mother    Diabetes Father    Emphysema Father    Diabetes Sister    Hypercholesterolemia Sister    Diabetes Sister    Hypertension Sister    Hypercholesterolemia Sister    Hypercholesterolemia Sister     Hypercholesterolemia Sister    Hypercholesterolemia Brother    Diabetes Brother    Hypertension Brother    Hypercholesterolemia Brother    Diabetes Brother    Hypertension Brother    Colon cancer Neg Hx     SOCIAL HISTORY:  Social History   Socioeconomic History   Marital status: Widowed    Spouse name: Not on file   Number of children: 3   Years of education: 7   Highest education level: 8th grade  Occupational History   Not on file  Tobacco Use   Smoking status: Never    Passive exposure: Never   Smokeless tobacco: Never  Vaping Use  Vaping status: Never Used  Substance and Sexual Activity   Alcohol use: No   Drug use: Never   Sexual activity: Never    Birth control/protection: Rhythm  Other Topics Concern   Not on file  Social History Narrative   Lives at home with husband   Caffeine, maybe 1 a day   Social Drivers of Corporate investment banker Strain: Low Risk  (10/14/2022)   Overall Financial Resource Strain (CARDIA)    Difficulty of Paying Living Expenses: Not hard at all  Food Insecurity: No Food Insecurity (10/14/2022)   Hunger Vital Sign    Worried About Running Out of Food in the Last Year: Never true    Ran Out of Food in the Last Year: Never true  Transportation Needs: No Transportation Needs (10/14/2022)   PRAPARE - Administrator, Civil Service (Medical): No    Lack of Transportation (Non-Medical): No  Recent Concern: Transportation Needs - Unmet Transportation Needs (08/20/2022)   PRAPARE - Transportation    Lack of Transportation (Medical): Yes    Lack of Transportation (Non-Medical): Yes  Physical Activity: Insufficiently Active (10/14/2022)   Exercise Vital Sign    Days of Exercise per Week: 1 day    Minutes of Exercise per Session: 30 min  Stress: No Stress Concern Present (10/14/2022)   [employer] of Occupational Health - Occupational Stress Questionnaire    Feeling of Stress : Only a little  Social Connections:  Moderately Integrated (10/14/2022)   Social Connection and Isolation Panel [NHANES]    Frequency of Communication with Friends and Family: More than three times a week    Frequency of Social Gatherings with Friends and Family: More than three times a week    Attends Religious Services: More than 4 times per year    Active Member of Clubs or Organizations: No    Attends Banker Meetings: 1 to 4 times per year    Marital Status: Widowed  Intimate Partner Violence: Not At Risk (08/20/2022)   Humiliation, Afraid, Rape, and Kick questionnaire    Fear of Current or Ex-Partner: No    Emotionally Abused: No    Physically Abused: No    Sexually Abused: No     PHYSICAL EXAM  GENERAL EXAM/CONSTITUTIONAL: Vitals:  Vitals:   03/01/23 1324  BP: (!) 177/94  Pulse: 67  Weight: 186 lb (84.4 kg)  Height: 5\' 9"  (1.753 m)   Body mass index is 27.47 kg/m. No results found. Patient is in no distress; well developed, nourished and groomed; neck is supple  CARDIOVASCULAR: Examination of carotid arteries is normal; no carotid bruits Regular rate and rhythm, no murmurs Examination of peripheral vascular system by observation and palpation is normal  EYES: Ophthalmoscopic exam of optic discs and posterior segments is normal; no papilledema or hemorrhages  MUSCULOSKELETAL: Gait, strength, tone, movements noted in Neurologic exam below  NEUROLOGIC: MENTAL STATUS:     02/25/2017    5:02 PM 02/21/2016   10:35 AM  MMSE - Mini Mental State Exam  Orientation to time 5 5  Orientation to Place 5 5  Registration 3 3  Attention/ Calculation 4 0  Attention/Calculation-comments  patient states she cannot spell or count  Recall 2 3  Language- name 2 objects 2 2  Language- repeat 1 1  Language- follow 3 step command 3 3  Language- read & follow direction 1 1  Write a sentence 1 1  Copy design 1 0  Total score 28  24   awake, alert, oriented to person, place and time recent and remote  memory intact normal attention and concentration language fluent, comprehension intact, naming intact,  fund of knowledge appropriate  CRANIAL NERVE:  2nd - no papilledema on fundoscopic exam 2nd, 3rd, 4th, 6th - pupils equal and reactive to light, visual fields full to confrontation, extraocular muscles intact, no nystagmus 5th - facial sensation symmetric 7th - facial strength symmetric 8th - hearing intact 9th - palate elevates symmetrically, uvula midline 11th - shoulder shrug symmetric 12th - tongue protrusion midline  MOTOR:  normal bulk and tone, full strength in the BUE, BLE  SENSORY:  normal and symmetric to light touch, pinprick, temperature, vibration EXCEPT DECR PP, VIB AT TOES; DECR PP IN GRADIENT UP TO SHINS  COORDINATION:  finger-nose-finger, fine finger movements normal  REFLEXES:  deep tendon reflexes --> BUE 1, KNEES TRACE, ANKLES ABSENT and symmetric  GAIT/STATION:  narrow based gait; ABLE TO WALK ON TOES AND HEELS    DIAGNOSTIC DATA (LABS, IMAGING, TESTING) - I reviewed patient records, labs, notes, testing and imaging myself where available.  Lab Results  Component Value Date   WBC 3.2 (L) 10/15/2022   HGB 12.3 10/15/2022   HCT 37.7 10/15/2022   MCV 94.0 10/15/2022   PLT 203.0 10/15/2022      Component Value Date/Time   NA 142 10/15/2022 1113   NA 141 10/11/2015 0000   K 3.9 10/15/2022 1113   CL 106 10/15/2022 1113   CO2 29 10/15/2022 1113   GLUCOSE 84 10/15/2022 1113   BUN 12 10/15/2022 1113   BUN 13 10/11/2015 0000   CREATININE 0.96 10/15/2022 1113   CREATININE 1.02 (H) 07/29/2021 1025   CALCIUM  9.3 10/15/2022 1113   PROT 7.3 10/15/2022 1113   ALBUMIN 3.8 10/15/2022 1113   AST 19 10/15/2022 1113   ALT 15 10/15/2022 1113   ALKPHOS 63 10/15/2022 1113   BILITOT 0.5 10/15/2022 1113   GFRNONAA >60 02/16/2022 1238   GFRNONAA 62 04/23/2020 1144   GFRAA 72 04/23/2020 1144   Lab Results  Component Value Date   CHOL 125 10/15/2022    HDL 52.40 10/15/2022   LDLCALC 64 10/15/2022   TRIG 43.0 10/15/2022   CHOLHDL 2 10/15/2022   Lab Results  Component Value Date   HGBA1C 6.2 10/15/2022   Lab Results  Component Value Date   VITAMINB12 881 11/26/2017   Lab Results  Component Value Date   TSH 2.44 04/08/2022   03/01/17 Component Ref Range & Units 4 yr ago  ENA SSA (RO) Ab 0.0 - 0.9 AI 4.6 High   ENA SSB (LA) Ab 0.0 - 0.9 AI <0.2        Component Ref Range & Units 6 mo ago 1 yr ago  SSA (Ro) (ENA) Antibody, IgG <1.0 NEG AI 6.5 POS Abnormal  7.3 POS Abnormal      Component Ref Range & Units 6 mo ago 1 yr ago  ANA Titer 1 titer 1:320 High  1:640 High  CM  Comment:                 Reference Range                 <1:40        Negative                 1:40-1:80    Low Antibody Level                 >  1:80        Elevated Antibody Level .  ANA Pattern 1  Nuclear, Speckled Abnormal  Nuclear, Speckled Abnormal    03/25/11 MRI lumbar spine [I reviewed images myself and agree with interpretation. -VRP]  1.  L3-L4 moderate bilateral facet arthrosis with mild central stenosis associated with facet hypertrophy and ligamentum flavum redundancy. 2.  Left eccentric broad-based L4-L5 disc bulge.  Left greater than right lateral recess stenosis and mild to moderate left foraminal stenosis potentially affecting L4 and L5 nerves. 3.  Moderate bilateral foraminal stenosis at L5-S1 associated with broad-based posterior disc bulging and endplate osteophytes.    ASSESSMENT AND PLAN  78 y.o. year old female here with 6-12 months of lower extremity cold sensation, burning sensation, numbness, with neurologic exam notable for decreased sensation in the feet and gradient up to shins, decreased reflexes lower extremity, consistent with peripheral neuropathy (could be related to underlying diabetes or autoimmune disease).   Dx: neuropathy (diabetic, autoimmune) vs lumbar spinal stenosis   1. Neuropathy due to Sjogren's syndrome (HCC)       PLAN:  NEUROPATHY (due to diabetes and sjogren's disease; positive ANA, postive SSA) - continue supportive care; hydroxychloroquine  - caution with balance, walking, driving - consider pain mgmt referral and PT evaluation  Return for return to PCP.  I spent 25 minutes of face-to-face and non-face-to-face time with patient.  This included previsit chart review, lab review, study review, order entry, electronic health record documentation, patient education.    Omega Bible, MD 03/01/2023, 1:28 PM Certified in Neurology, Neurophysiology and Neuroimaging  [facility] [address] [place], [place] [address] [phone]

## 2023-03-01 NOTE — Patient Instructions (Addendum)
  NEUROPATHY (due to diabetes and sjogren's disease; positive ANA, postive SSA) - continue supportive care; hydroxychloroquine  - caution with balance, walking, driving - consider pain mgmt referral and PT evaluation

## 2023-03-04 ENCOUNTER — Encounter: Payer: Self-pay | Admitting: Internal Medicine

## 2023-03-09 ENCOUNTER — Ambulatory Visit: Payer: Self-pay | Admitting: Internal Medicine

## 2023-03-09 NOTE — Telephone Encounter (Signed)
Chief Complaint: HTN Symptoms: High BP, headache, blurred vision Frequency: 4 days Pertinent Negatives: Patient denies chest pain, difficulty breathing Disposition: [x] ED /[] Urgent Care (no appt availability in office) / [] Appointment(In office/virtual)/ []  [place] [facility]/ [] Home Care/ [] Refused Recommended Disposition /[] [facility]/ []  Follow-up with PCP Additional Notes: Patient called in reporting higher than normal blood pressure over last 4 days. Patient states she is still taking metoprolol daily as prescribed, but the last 4 days her BP has been running much higher than normal. Patient is reporting new onset of blurred vision over last 4 days, along with headache. Due to hypertension and neurologic symptoms, this RN referred patient to ED for r/o hypertensive emergency. Patient states her sister will be taking her to ED now.    Copied from CRM [id number]. Topic: Clinical - Red Word Triage >> Mar 09, 2023 12:24 PM Angela Simmons wrote: [facility] that prompted transfer to Nurse Triage: Patient has had High Blood pressure for few days that is higher than normal. Line was disconnect Agent called pt back but the line was making Simmons weird clicking noise Reason for Disposition  [1] Systolic BP  >= 160 OR Diastolic >= 100 AND [2] cardiac (e.g., breathing difficulty, chest pain) or neurologic symptoms (e.g., new-onset blurred or double vision, unsteady gait)  Answer Assessment - Initial Assessment Questions 1. BLOOD PRESSURE: "What is the blood pressure?" "Did you take at least two measurements 5 minutes apart?"     168/80 2. ONSET: "When did you take your blood pressure?"     5 minutes 3. HOW: "How did you take your blood pressure?" (e.g., automatic home BP monitor, visiting nurse)     Home cuff - upper arm 4. HISTORY: "Do you have Simmons history of high blood pressure?"     Yes 5. MEDICINES: "Are you taking any medicines for blood pressure?" "Have you missed any doses recently?"      Lopressor 6. OTHER SYMPTOMS: "Do you have any symptoms?" (e.g., blurred vision, chest pain, difficulty breathing, headache, weakness)     Blurred vision, weakness, headache  Protocols used: Blood Pressure - High-Simmons-AH

## 2023-03-10 NOTE — Progress Notes (Unsigned)
    Subjective:    Patient ID: Angela Simmons, female    DOB: 08/19/1945, 78 y.o.   MRN: [id number]      HPI [place] is here for No chief complaint on file.   BP elevated, HA, blurry vision - BP at home 168/80  Currently taking benazepril 40 mg daily, diltiazem XR 120 mg daily, metoprolol 25 mg twice daily   Medications and allergies reviewed with patient and updated if appropriate.  Current Outpatient Medications on File Prior to Visit  Medication Sig Dispense Refill   ACCU-CHEK GUIDE TEST test strip USE 1 STRIP TO CHECK GLUCOSE ONCE DAILY 100 each 0   apixaban (ELIQUIS) 5 MG TABS tablet Take 1 tablet (5 mg total) by mouth 2 (two) times daily. 60 tablet 5   atorvastatin (LIPITOR) 10 MG tablet Take 1 tablet by mouth once daily 180 tablet 0   benazepril (LOTENSIN) 40 MG tablet Take 1 tablet (40 mg total) by mouth daily. 90 tablet 2   Blood Glucose Monitoring Suppl (ONETOUCH VERIO) w/Device KIT USE TO TEST BLOOD SUGAR ONCE DAILY AS DIRECTED 1 kit 0   diclofenac sodium (VOLTAREN) 1 % GEL Apply 3 g to 3 large joints up to 3 times daily. (Patient taking differently: Apply 3 g topically 3 (three) times daily as needed (pain).) 3 Tube 3   diltiazem (DILT-XR) 120 MG 24 hr capsule Take 1 capsule (120 mg total) by mouth daily. 90 capsule 3   fluticasone (FLONASE) 50 MCG/ACT nasal spray Place 1 spray into both nostrils as needed for allergies or rhinitis.     hydroxychloroquine (PLAQUENIL) 200 MG tablet Take 1 tablet by mouth once daily 90 tablet 0   loratadine (CLARITIN) 10 MG tablet TAKE 1 TABLET BY MOUTH EVERY DAY 30 tablet 2   Magnesium 250 MG TABS Take 250 mg by mouth daily.     metoprolol tartrate (LOPRESSOR) 25 MG tablet Take 1 tablet (25 mg total) by mouth 2 (two) times daily. (May take an extra 25mg  as needed for palpitation.) 03/13/2022 270 tablet 3   Multiple Vitamins-Minerals (CENTRUM SILVER 50+WOMEN) TABS Take 1 tablet by mouth daily at 6 (six) AM.     Omega-3 Fatty Acids (FISH OIL)  1000 MG CAPS Take 1,000 mg by mouth daily.     omeprazole (PRILOSEC) 40 MG capsule Take 1 capsule by mouth once daily 90 capsule 0   ONETOUCH DELICA LANCETS 30G MISC USE TO TEST ONCE DAILY 100 each 2   ONETOUCH VERIO test strip USE TO TEST BLOOD SUGAR ONCE DAILY 100 each 0   Polyvinyl Alcohol-Povidone (REFRESH OP) Apply to eye 2 (two) times daily.     No current facility-administered medications on file prior to visit.    Review of Systems     Objective:  There were no vitals filed for this visit. BP Readings from Last 3 Encounters:  03/01/23 (!) 177/94  11/03/22 (!) 166/79  10/15/22 122/84   Wt Readings from Last 3 Encounters:  03/01/23 186 lb (84.4 kg)  11/03/22 186 lb 6.4 oz (84.6 kg)  10/15/22 187 lb (84.8 kg)   There is no height or weight on file to calculate BMI.    Physical Exam         Assessment & Plan:    See Problem List for Assessment and Plan of chronic medical problems.

## 2023-03-10 NOTE — Patient Instructions (Addendum)
   Goal BP is < 130/80.       Medications changes include :   hydrochlorothiazide 12.5 mg daily       Return in about 3 weeks (around 04/01/2023) for follow up.

## 2023-03-11 ENCOUNTER — Encounter: Payer: Self-pay | Admitting: Internal Medicine

## 2023-03-11 ENCOUNTER — Ambulatory Visit (INDEPENDENT_AMBULATORY_CARE_PROVIDER_SITE_OTHER): Payer: 59 | Admitting: Internal Medicine

## 2023-03-11 VITALS — BP 138/74 | HR 70 | Temp 98.1°F | Ht 69.0 in | Wt 181.0 lb

## 2023-03-11 DIAGNOSIS — I480 Paroxysmal atrial fibrillation: Secondary | ICD-10-CM | POA: Diagnosis not present

## 2023-03-11 DIAGNOSIS — E1140 Type 2 diabetes mellitus with diabetic neuropathy, unspecified: Secondary | ICD-10-CM | POA: Diagnosis not present

## 2023-03-11 DIAGNOSIS — E7849 Other hyperlipidemia: Secondary | ICD-10-CM | POA: Diagnosis not present

## 2023-03-11 DIAGNOSIS — I10 Essential (primary) hypertension: Secondary | ICD-10-CM | POA: Diagnosis not present

## 2023-03-11 LAB — CBC WITH DIFFERENTIAL/PLATELET
Basophils Absolute: 0 10*3/uL (ref 0.0–0.1)
Basophils Relative: 0.7 % (ref 0.0–3.0)
Eosinophils Absolute: 0 10*3/uL (ref 0.0–0.7)
Eosinophils Relative: 0.4 % (ref 0.0–5.0)
HCT: 38.7 % (ref 36.0–46.0)
Hemoglobin: 12.7 g/dL (ref 12.0–15.0)
Lymphocytes Relative: 31.5 % (ref 12.0–46.0)
Lymphs Abs: 1.2 10*3/uL (ref 0.7–4.0)
MCHC: 32.9 g/dL (ref 30.0–36.0)
MCV: 94.7 fL (ref 78.0–100.0)
Monocytes Absolute: 0.3 10*3/uL (ref 0.1–1.0)
Monocytes Relative: 8.6 % (ref 3.0–12.0)
Neutro Abs: 2.2 10*3/uL (ref 1.4–7.7)
Neutrophils Relative %: 58.8 % (ref 43.0–77.0)
Platelets: 204 10*3/uL (ref 150.0–400.0)
RBC: 4.08 Mil/uL (ref 3.87–5.11)
RDW: 13.5 % (ref 11.5–15.5)
WBC: 3.7 10*3/uL — ABNORMAL LOW (ref 4.0–10.5)

## 2023-03-11 LAB — COMPREHENSIVE METABOLIC PANEL
ALT: 14 U/L (ref 0–35)
AST: 17 U/L (ref 0–37)
Albumin: 4.1 g/dL (ref 3.5–5.2)
Alkaline Phosphatase: 64 U/L (ref 39–117)
BUN: 12 mg/dL (ref 6–23)
CO2: 29 meq/L (ref 19–32)
Calcium: 9.2 mg/dL (ref 8.4–10.5)
Chloride: 102 meq/L (ref 96–112)
Creatinine, Ser: 0.98 mg/dL (ref 0.40–1.20)
GFR: 55.96 mL/min — ABNORMAL LOW (ref >60.00)
Glucose, Bld: 106 mg/dL — ABNORMAL HIGH (ref 70–99)
Potassium: 3.9 meq/L (ref 3.5–5.1)
Sodium: 138 meq/L (ref 135–145)
Total Bilirubin: 0.5 mg/dL (ref 0.2–1.2)
Total Protein: 7.7 g/dL (ref 6.0–8.3)

## 2023-03-11 LAB — LIPID PANEL
Cholesterol: 127 mg/dL (ref 0–200)
HDL: 51.3 mg/dL (ref >39.00)
LDL Cholesterol: 65 mg/dL (ref 0–99)
NonHDL: 75.28
Total CHOL/HDL Ratio: 2
Triglycerides: 50 mg/dL (ref 0.0–149.0)
VLDL: 10 mg/dL (ref 0.0–40.0)

## 2023-03-11 LAB — HEMOGLOBIN A1C: Hgb A1c MFr Bld: 6.2 % (ref 4.6–6.5)

## 2023-03-11 MED ORDER — HYDROCHLOROTHIAZIDE 12.5 MG PO TABS: 12.5000 mg | ORAL_TABLET | Freq: Every day | ORAL | 1 refills | Status: DC

## 2023-03-11 NOTE — Assessment & Plan Note (Signed)
Chronic  Lab Results  Component Value Date   HGBA1C 6.2 10/15/2022   Sugars well controlled She brought her readings in for me to review and sugars are well-controlled Check a1c Continue lifestyle control

## 2023-03-11 NOTE — Assessment & Plan Note (Addendum)
Chronic Blood pressure a little high here today and has been spiking up at home  Continue benazepril 40 mg daily, diltiazem 120 mg daily, metoprolol 25 mg twice daily Start hydrochlorothiazide 12.5 mg daily Follow up in 3 weeks - blood work at that time

## 2023-03-11 NOTE — Assessment & Plan Note (Signed)
Chronic Regular exercise and healthy diet encouraged Check lipid panel  Continue atorvastatin 10 mg daily 

## 2023-03-11 NOTE — Assessment & Plan Note (Addendum)
Chronic Following with cardiology Paroxysmal atrial fibrillation On metoprolol 25 mg twice daily, diltiazem XR 120 mg daily and Eliquis 5 mg twice daily

## 2023-03-12 ENCOUNTER — Ambulatory Visit: Payer: 59 | Admitting: Internal Medicine

## 2023-03-12 ENCOUNTER — Encounter: Payer: Self-pay | Admitting: Internal Medicine

## 2023-03-15 ENCOUNTER — Encounter: Payer: Self-pay | Admitting: Cardiology

## 2023-03-15 ENCOUNTER — Ambulatory Visit: Payer: 59 | Attending: Cardiology | Admitting: Cardiology

## 2023-03-15 VITALS — BP 150/90 | HR 64 | Ht 69.0 in | Wt 184.4 lb

## 2023-03-15 DIAGNOSIS — D6869 Other thrombophilia: Secondary | ICD-10-CM | POA: Diagnosis not present

## 2023-03-15 DIAGNOSIS — E782 Mixed hyperlipidemia: Secondary | ICD-10-CM

## 2023-03-15 DIAGNOSIS — I10 Essential (primary) hypertension: Secondary | ICD-10-CM

## 2023-03-15 DIAGNOSIS — I4892 Unspecified atrial flutter: Secondary | ICD-10-CM | POA: Diagnosis not present

## 2023-03-15 NOTE — Progress Notes (Signed)
 Clinical Summary Ms. Philippi is a 78 y.o.female seen today for follow up of the following medical problems.      1. Aflutter/ Atach - noted during 07/2019 admission. Started on lopressor and eliquis at that time.    -higher doses of dilt caused dizziness, fatigue.    06/2020 monitor showed infrequent short runs of SVT.  -seen in ER 02/16/22 with aflutter with RVR - converted to SR with IV diltiazem in ER, discharged from ER      - can have some palpitations, infrequent just a few seconds. Overall not bothersome - no bleeding on eliquis   2. SOB 03/2021 echo: LVEF 55-60%, no WMAs, indet diastolic. Normal RV, PASP 44 - no recent issues.    3. History of chest pain - cardiac catheterization in 11/2019 showed a normal left system as outlined above but the RCA was unable to be engaged. Medical therapy was recommended unless she had refractory symptoms -no recent symptoms     4. HTN - home bp's 120s-130s/60s-70s - has had some white coat HTN  -pcp started hydrochlorothiazide, has f/u in just a few weeks.    5. Hyperlipdiemia - she is on atorvastatin 10mg    06/2021 TC 126 TG 39 HDL 52 LDL 66 12/2021 TC 138 TG 43 HDL 51 LDL 78 03/2022 TC 140 TG 44 HDL 52 LDL 78 02/2023 TC 127 TG 50 HDL 51 LDL 65          Past Medical History:  Diagnosis Date   Breast cancer (HCC) 1990s   right breast   Collagen vascular disease (HCC)    Diabetes mellitus without complication (HCC)    GERD (gastroesophageal reflux disease)    HTN (hypertension)    Hypercholesterolemia    Obesity    Osteoarthritis      Allergies  Allergen Reactions   Levofloxacin Hives and Itching   Nitrofurantoin Hives and Itching   Penicillins Hives and Itching   Sulfonamide Derivatives Itching   Pantoprazole Other (See Comments)    Bad dreams     Current Outpatient Medications  Medication Sig Dispense Refill   ACCU-CHEK GUIDE TEST test strip USE 1 STRIP TO CHECK GLUCOSE ONCE DAILY 100 each 0    apixaban (ELIQUIS) 5 MG TABS tablet Take 1 tablet (5 mg total) by mouth 2 (two) times daily. 60 tablet 5   atorvastatin (LIPITOR) 10 MG tablet Take 1 tablet by mouth once daily 180 tablet 0   benazepril (LOTENSIN) 40 MG tablet Take 1 tablet (40 mg total) by mouth daily. 90 tablet 2   Blood Glucose Monitoring Suppl (ONETOUCH VERIO) w/Device KIT USE TO TEST BLOOD SUGAR ONCE DAILY AS DIRECTED 1 kit 0   diclofenac sodium (VOLTAREN) 1 % GEL Apply 3 g to 3 large joints up to 3 times daily. (Patient taking differently: Apply 3 g topically 3 (three) times daily as needed (pain).) 3 Tube 3   diltiazem (DILT-XR) 120 MG 24 hr capsule Take 1 capsule (120 mg total) by mouth daily. 90 capsule 3   fluticasone (FLONASE) 50 MCG/ACT nasal spray Place 1 spray into both nostrils as needed for allergies or rhinitis.     hydrochlorothiazide (HYDRODIURIL) 12.5 MG tablet Take 1 tablet (12.5 mg total) by mouth daily. 90 tablet 1   hydroxychloroquine (PLAQUENIL) 200 MG tablet Take 1 tablet by mouth once daily 90 tablet 0   loratadine (CLARITIN) 10 MG tablet TAKE 1 TABLET BY MOUTH EVERY DAY 30 tablet 2   Magnesium  250 MG TABS Take 250 mg by mouth daily.     metoprolol tartrate (LOPRESSOR) 25 MG tablet Take 1 tablet (25 mg total) by mouth 2 (two) times daily. (May take an extra 25mg  as needed for palpitation.) 03/13/2022 270 tablet 3   Multiple Vitamins-Minerals (CENTRUM SILVER 50+WOMEN) TABS Take 1 tablet by mouth daily at 6 (six) AM.     Omega-3 Fatty Acids (FISH OIL) 1000 MG CAPS Take 1,000 mg by mouth daily.     omeprazole (PRILOSEC) 40 MG capsule Take 1 capsule by mouth once daily 90 capsule 0   ONETOUCH DELICA LANCETS 30G MISC USE TO TEST ONCE DAILY 100 each 2   ONETOUCH VERIO test strip USE TO TEST BLOOD SUGAR ONCE DAILY 100 each 0   Polyvinyl Alcohol-Povidone (REFRESH OP) Apply to eye 2 (two) times daily.     No current facility-administered medications for this visit.     Past Surgical History:  Procedure  Laterality Date   ABDOMINAL HYSTERECTOMY  1993   [place], fibroids   AORTIC ARCH ANGIOGRAPHY N/A 12/07/2019   Procedure: AORTIC ARCH ANGIOGRAPHY;  Surgeon: Runell Gess, MD;  Location: [facility];  Service: Cardiovascular;  Laterality: N/A;   COLONOSCOPY  May 2003   Dr. Katrinka Blazing: normal   COLONOSCOPY  06/22/2011   Procedure: COLONOSCOPY;  Surgeon: West Bali, MD;  Location: [facility];  Service: Endoscopy;  Laterality: N/A;  10:30   COLONOSCOPY WITH PROPOFOL N/A 05/13/2022   Procedure: COLONOSCOPY WITH PROPOFOL;  Surgeon: Corbin Ade, MD;  Location: [facility];  Service: Endoscopy;  Laterality: N/A;  1:15 PM   LEFT HEART CATH AND CORONARY ANGIOGRAPHY N/A 12/07/2019   Procedure: LEFT HEART CATH AND CORONARY ANGIOGRAPHY;  Surgeon: Runell Gess, MD;  Location: [facility];  Service: Cardiovascular;  Laterality: N/A;   MASTECTOMY  1995   right    POLYPECTOMY  05/13/2022   Procedure: POLYPECTOMY;  Surgeon: Corbin Ade, MD;  Location: [facility];  Service: Endoscopy;;     Allergies  Allergen Reactions   Levofloxacin Hives and Itching   Nitrofurantoin Hives and Itching   Penicillins Hives and Itching   Sulfonamide Derivatives Itching   Pantoprazole Other (See Comments)    Bad dreams      Family History  Problem Relation Age of Onset   Diabetes Mother        diabetic coma   Heart failure Mother    Diabetes Father    Emphysema Father    Diabetes Sister    Hypercholesterolemia Sister    Diabetes Sister    Hypertension Sister    Hypercholesterolemia Sister    Hypercholesterolemia Sister    Hypercholesterolemia Sister    Hypercholesterolemia Brother    Diabetes Brother    Hypertension Brother    Hypercholesterolemia Brother    Diabetes Brother    Hypertension Brother    Colon cancer Neg Hx      Social History Ms. Ala reports that she has never smoked. She has never been exposed to tobacco smoke. She has never used smokeless  tobacco. Ms. Abt reports no history of alcohol use.   Physical Examination Today's Vitals   03/15/23 1441 03/15/23 1517  BP: (!) 138/98 (!) 150/90  Pulse: 64   SpO2: 97%   Weight: 184 lb 6.4 oz (83.6 kg)   Height: 5\' 9"  (1.753 m)    Body mass index is 27.23 kg/m.   Gen: resting comfortably, no acute distress HEENT:  no scleral icterus, pupils equal round and reactive, no palptable cervical adenopathy,  CV: RRR, no m/rg, no jvd Resp: Clear to auscultation bilaterally GI: abdomen is soft, non-tender, non-distended, normal bowel sounds, no hepatosplenomegaly MSK: extremities are warm, no edema.  Skin: warm, no rash Neuro:  no focal deficits Psych: appropriate affect   Diagnostic Studies  05/2019 nuclear stress No diagnostic ST segment changes to indicate ischemia. No significant myocardial perfusion defects to indicate scar or ischemia. There is apical breast attenuation artifact This is a low risk study. Nuclear stress EF: 72%.   04/2019 echo IMPRESSIONS     1. Left ventricular ejection fraction, by estimation, is 60 to 65%. The  left ventricle has normal function. The left ventricle has no regional  wall motion abnormalities. There is mild left ventricular hypertrophy.  Left ventricular diastolic parameters  were normal.   2. Right ventricular systolic function is normal. The right ventricular  size is normal. There is mildly elevated pulmonary artery systolic  pressure.   3. The mitral valve is normal in structure. Trivial mitral valve  regurgitation. No evidence of mitral stenosis.   4. The aortic valve is tricuspid. Aortic valve regurgitation is not  visualized. No aortic stenosis is present.   5. The inferior vena cava is normal in size with greater than 50%  respiratory variability, suggesting right atrial pressure of 3 mmHg.   11/2019 cath IMPRESSION: Ms. Huseman has normal left coronary system.  She has an apparent takeoff RCA from close to the left  main takeoff.  I was unable to selectively intubate this from the right radial approach.  I did use 180 cc of contrast and I aborted the procedure.  She will need to return for RCA angiography via the right femoral approach.  The sheath was removed and a TR band was placed on the right wrist to achieve patent hemostasis.  The patient left lab in stable condition.  She will be discharged home today as an outpatient.   06/2020 event monitor Rare supraventricular ectopy. Rare short runs of SVT up to 12 beats. Rare ventricular ectopy Reported episode of "fainting" correlated with sinus rhythm at 60 bpm     Patch Wear Time:  13 days and 16 hours (2022-05-18T11:46:34-0400 to 2022-06-01T04:15:38-0400)   Patient had a min HR of 41 bpm, max HR of 174 bpm, and avg HR of 55 bpm. Predominant underlying rhythm was Sinus Rhythm. 27 Supraventricular Tachycardia runs occurred, the run with the fastest interval lasting 5 beats with a max rate of 174 bpm, the longest lasting 12 beats with an avg rate of 113 bpm. Isolated SVEs were rare (<1.0%), SVE Couplets were rare (<1.0%), and SVE Triplets were rare (<1.0%). Isolated VEs were rare (<1.0%), VE Couplets were rare (<1.0%), and no VE Triplets were present. Ventricular Bigeminy was present.   03/2021 echo IMPRESSIONS     1. Left ventricular ejection fraction, by estimation, is 55 to 60%. The  left ventricle has normal function. The left ventricle has no regional  wall motion abnormalities. There is mild asymmetric left ventricular  hypertrophy of the basal segment. Left  ventricular diastolic parameters are indeterminate.   2. Right ventricular systolic function is normal. The right ventricular  size is normal. There is mildly elevated pulmonary artery systolic  pressure. The estimated right ventricular systolic pressure is 44.0 mmHg.   3. The mitral valve is grossly normal. Mild mitral valve regurgitation.   4. The aortic valve is tricuspid. Aortic valve  regurgitation is  mild.  Aortic valve sclerosis is present, with no evidence of aortic valve  stenosis.   5. The inferior vena cava is normal in size with greater than 50%  respiratory variability, suggesting right atrial pressure of 3 mmHg.   Comparison(s): Prior images reviewed side by side. LVEF remains normal  range at 55-60%. Estimated RVSP mildly elevated.              Assessment and Plan   1. Aflutter/atach/acquired thrombophilia -denies symptoms, continue current meds incluidnig eliquis for stroke prevention   2. HTN - above goal, pcp has just added hydrochlorothiazide 12.5mg  and has upcoming f/u. Defer to pcp to avoid confusion from many providers managing same issue.    3. Hyperipidemia - she is at goal, continue current meds     Antoine Poche, M.D.

## 2023-03-15 NOTE — Patient Instructions (Addendum)

## 2023-03-18 ENCOUNTER — Other Ambulatory Visit: Payer: Self-pay | Admitting: Cardiology

## 2023-03-18 DIAGNOSIS — E1142 Type 2 diabetes mellitus with diabetic polyneuropathy: Secondary | ICD-10-CM | POA: Diagnosis not present

## 2023-03-18 DIAGNOSIS — B351 Tinea unguium: Secondary | ICD-10-CM | POA: Diagnosis not present

## 2023-03-18 DIAGNOSIS — L84 Corns and callosities: Secondary | ICD-10-CM | POA: Diagnosis not present

## 2023-03-18 DIAGNOSIS — M79676 Pain in unspecified toe(s): Secondary | ICD-10-CM | POA: Diagnosis not present

## 2023-03-22 NOTE — Progress Notes (Signed)
 Office Visit Note  Patient: Angela Simmons             Date of Birth: Nov 08, 1945           MRN: [id number]             PCP: Pincus Sanes, MD Referring: Pincus Sanes, MD Visit Date: 04/05/2023 Occupation: @GUAROCC @  Subjective:  Neck stiffness   History of Present Illness: Angela Simmons is a 78 y.o. female with history of sjogren's syndrome and osteoarthritis.  Patient remains on Plaquenil 200 mg 1 tablet by mouth daily.  She is tolerating it without any side effects and has not had any recent gaps in therapy.  Patient continues to have chronic sicca symptoms.  She denies any new or worsening pulmonary symptoms.  Patient states for the past 2 weeks has been experiencing increased neck pain and stiffness.  She has also had intermittent discomfort in the left elbow which she describes as a soreness.  She continues to have ongoing soreness in both knee joints.  He denies any joint swelling at this time. Patient states that she is scheduled to update her Plaquenil examination in April 2025.   Activities of Daily Living:  Patient reports morning stiffness for 1 hour.   Patient Reports nocturnal pain.  Difficulty dressing/grooming: Denies Difficulty climbing stairs: Reports Difficulty getting out of chair: Reports Difficulty using hands for taps, buttons, cutlery, and/or writing: Reports  Review of Systems  Constitutional:  Positive for fatigue.  HENT:  Positive for mouth dryness and nose dryness. Negative for mouth sores.   Eyes:  Positive for dryness. Negative for pain.  Respiratory:  Negative for shortness of breath and difficulty breathing.   Cardiovascular:  Positive for palpitations. Negative for chest pain.  Gastrointestinal:  Negative for blood in stool, constipation and diarrhea.  Endocrine: Negative for increased urination.  Genitourinary:  Negative for involuntary urination.  Musculoskeletal:  Positive for joint pain, gait problem, joint pain, myalgias, muscle weakness,  morning stiffness and myalgias. Negative for joint swelling and muscle tenderness.  Skin:  Positive for rash. Negative for color change, hair loss and sensitivity to sunlight.  Allergic/Immunologic: Negative for susceptible to infections.  Neurological:  Positive for dizziness and headaches.  Hematological:  Positive for bruising/bleeding tendency. Negative for swollen glands.  Psychiatric/Behavioral:  Negative for depressed mood and sleep disturbance. The patient is not nervous/anxious.     PMFS History:  Patient Active Problem List   Diagnosis Date Noted   Vivid dream 04/08/2022   Peripheral neuropathy due to sjogren's disease 02/21/2022   Sensation of heaviness 01/05/2022   Mid back pain on left side 11/13/2021   Cervical radiculopathy due to degenerative joint disease of spine 02/13/2021   Right-sided chest pain 02/13/2021   Bilateral primary osteoarthritis of knee 02/13/2021   Right knee pain 01/03/2021   Radiculopathy 01/03/2021   Atrial flutter (HCC) 08/22/2020   Aortic atherosclerosis (HCC) 06/28/2020   Secondary hypercoagulable state (HCC) 12/26/2019   Anticoagulated 12/05/2019   PAF (paroxysmal atrial fibrillation) (HCC) 08/04/2019   DOE (dyspnea on exertion) 04/03/2019   Ankle swelling, left 04/03/2019   Hyperpigmented skin lesion 04/03/2019   Poor balance 12/06/2018   Headache 12/06/2018   Vertigo 06/07/2018   Urinary frequency 06/07/2018   Abdominal pain 06/07/2018   Herpes zoster without complication 03/23/2018   Tingling in extremities 11/26/2017   Ischial bursitis of left side 06/02/2017   Sicca syndrome (HCC) 06/23/2016   Bilateral leg edema 04/07/2016  Burning sensation of skin 04/07/2016   PMR (polymyalgia rheumatica) (HCC) 03/27/2016   Osteopenia 03/04/2016   Dysuria 02/26/2016   Autoimmune disease (HCC) 03/18/2015   Inflammatory arthritis 03/18/2015   Joint pain 11/22/2014   Overweight (BMI 25.0-29.9) 08/18/2014   Low back pain with radiation  08/18/2014   Hyperlipidemia 06/28/2014   Chest pain with moderate risk of acute coronary syndrome 04/11/2014   Tendonitis of shoulder 08/29/2013   S/P right mastectomy 11/14/2012   Type 2 diabetes, controlled, with neuropathy (HCC) 10/05/2012   Allergic rhinitis 01/26/2011   GERD 11/10/2009   INTERSTITIAL CYSTITIS 10/31/2008   BENIGN NEOPLASM OF VULVA 02/01/2008   Essential hypertension, benign 02/08/2007   History of cancer of right breast 02/08/2007    Past Medical History:  Diagnosis Date   Breast cancer (HCC) 1990s   right breast   Collagen vascular disease (HCC)    Diabetes mellitus without complication (HCC)    GERD (gastroesophageal reflux disease)    HTN (hypertension)    Hypercholesterolemia    Obesity    Osteoarthritis     Family History  Problem Relation Age of Onset   Diabetes Mother        diabetic coma   Heart failure Mother    Diabetes Father    Emphysema Father    Diabetes Sister    Hypercholesterolemia Sister    Diabetes Sister    Hypertension Sister    Hypercholesterolemia Sister    Hypercholesterolemia Sister    Hypercholesterolemia Sister    Hypercholesterolemia Brother    Diabetes Brother    Hypertension Brother    Hypercholesterolemia Brother    Diabetes Brother    Hypertension Brother    Colon cancer Neg Hx    Past Surgical History:  Procedure Laterality Date   ABDOMINAL HYSTERECTOMY  1993   [place]   AORTIC ARCH ANGIOGRAPHY N/A 12/07/2019   Procedure: AORTIC ARCH ANGIOGRAPHY;  Surgeon: Runell Gess, MD;  Location: [facility];  Service: Cardiovascular;  Laterality: N/A;   COLONOSCOPY  May 2003   Dr. Katrinka Blazing: normal   COLONOSCOPY  06/22/2011   Procedure: COLONOSCOPY;  Surgeon: West Bali, MD;  Location: [facility];  Service: Endoscopy;  Laterality: N/A;  10:30   COLONOSCOPY WITH PROPOFOL N/A 05/13/2022   Procedure: COLONOSCOPY WITH PROPOFOL;  Surgeon: Corbin Ade, MD;  Location: [facility];  Service:  Endoscopy;  Laterality: N/A;  1:15 PM   LEFT HEART CATH AND CORONARY ANGIOGRAPHY N/A 12/07/2019   Procedure: LEFT HEART CATH AND CORONARY ANGIOGRAPHY;  Surgeon: Runell Gess, MD;  Location: [facility];  Service: Cardiovascular;  Laterality: N/A;   MASTECTOMY  1995   right    POLYPECTOMY  05/13/2022   Procedure: POLYPECTOMY;  Surgeon: Corbin Ade, MD;  Location: [facility];  Service: Endoscopy;;   Social History   Social History Narrative   Lives at home with husband   Caffeine, maybe 1 a day   Immunization History  Administered Date(s) Administered   Fluad Quad(high Dose 65+) 12/06/2018, 12/27/2019, 01/03/2021   Fluad Trivalent(High Dose 65+) 10/15/2022   Influenza Split 11/14/2010   Influenza Whole 10/18/2006, 10/31/2008, 11/06/2009   Influenza, High Dose Seasonal PF 11/22/2014, 02/21/2016, 12/18/2016, 11/05/2017   Influenza,inj,Quad PF,6+ Mos 11/14/2012, 11/23/2013   Moderna Sars-Covid-2 Vaccination 03/16/2019, 04/13/2019   Pneumococcal Conjugate-13 04/11/2014   Pneumococcal Polysaccharide-23 09/17/2011   Td 07/05/2003   Zoster Recombinant(Shingrix) 11/30/2017, 02/02/2018   Zoster, Live 06/15/2013  Objective: Vital Signs: BP (!) 167/92 (BP Location: Left Arm, Patient Position: Sitting, Cuff Size: Large)   Pulse 61   Resp 14   Ht 5\' 9"  (1.753 m)   Wt 182 lb (82.6 kg)   BMI 26.88 kg/m    Physical Exam Vitals and nursing note reviewed.  Constitutional:      Appearance: She is well-developed.  HENT:     Head: Normocephalic and atraumatic.  Eyes:     Conjunctiva/sclera: Conjunctivae normal.  Cardiovascular:     Rate and Rhythm: Normal rate and regular rhythm.     Heart sounds: Normal heart sounds.  Pulmonary:     Effort: Pulmonary effort is normal.     Breath sounds: Normal breath sounds.  Abdominal:     General: Bowel sounds are normal.     Palpations: Abdomen is soft.  Musculoskeletal:     Cervical back: Normal range of motion.   Lymphadenopathy:     Cervical: No cervical adenopathy.  Skin:    General: Skin is warm and dry.     Capillary Refill: Capillary refill takes less than 2 seconds.  Neurological:     Mental Status: She is alert and oriented to person, place, and time.  Psychiatric:        Behavior: Behavior normal.      Musculoskeletal Exam: C-spine has limited range of motion especially with lateral rotation.  Shoulder joints have good range of motion.  Tenderness over the medial epicondyle of the left elbow.  PIP and DIP thickening consistent with osteoarthritis of both hands.  Complete fist formation bilaterally.  Hip joints have good range of motion with no groin pain.  Tenderness over the trochanteric bursa of the left hip. Knee joints have good range of motion with no warmth or effusion.  Ankle joints have good ROM with no tenderness or joint swelling.   CDAI Exam: CDAI Score: -- Patient Global: --; Provider Global: -- Swollen: --; Tender: -- Joint Exam 04/05/2023   No joint exam has been documented for this visit   There is currently no information documented on the homunculus. Go to the Rheumatology activity and complete the homunculus joint exam.  Investigation: No additional findings.  Imaging: No results found.  Recent Labs: Lab Results  Component Value Date   WBC 3.7 (L) 03/11/2023   HGB 12.7 03/11/2023   PLT 204.0 03/11/2023   NA 138 03/11/2023   K 3.9 03/11/2023   CL 102 03/11/2023   CO2 29 03/11/2023   GLUCOSE 106 (H) 03/11/2023   BUN 12 03/11/2023   CREATININE 0.98 03/11/2023   BILITOT 0.5 03/11/2023   ALKPHOS 64 03/11/2023   AST 17 03/11/2023   ALT 14 03/11/2023   PROT 7.7 03/11/2023   ALBUMIN 4.1 03/11/2023   CALCIUM 9.2 03/11/2023   GFRAA 72 04/23/2020   QFTBGOLDPLUS Negative 07/25/2019    Speciality Comments: PLQ Eye Exam: 01/09/2022 WNL @ [facility] [place] up in 1 year  Procedures:  No procedures performed Allergies: Levofloxacin, Nitrofurantoin,  Penicillins, Sulfonamide derivatives, and Pantoprazole   Assessment / Plan:     Visit Diagnoses: Sjogren's syndrome with keratoconjunctivitis sicca (HCC) +ANA,+SSA,RF-:  Patient continues to have chronic sicca symptoms-unchanged.  Her symptoms have been clinically stable taking Plaquenil 200 mg 1 tablet by mouth daily.  No signs of synovitis noted on exam.  No new or worsening pulmonary symptoms.  Discussed the 4-14 fold increased risk for developing lymphoma in patients with Sjogren's syndrome.  The following lab work will  be obtained today for further evaluation.  She was advised to notify us if she develops any new or worsening symptoms.  No change in therapy at this time.  She will follow up in 6 months or sooner if needed.  plan: Anti-DNA antibody, double-stranded, C3 and C4, Sedimentation rate, CBC with Differential/Platelet, COMPLETE METABOLIC PANEL WITH GFR, Protein / creatinine ratio, urine, Serum protein electrophoresis with reflex, Rheumatoid factor, ANA, Sjogrens syndrome-A extractable nuclear antibody, Sjogrens syndrome-B extractable nuclear antibody  PMR (polymyalgia rheumatica) (HCC) - PMR is in remission.  No signs or symptoms of a polymyalgia rheumatica flare.  No difficulty rising from a seated position or raising her arms above her head.  She is vies notify us if she develops signs or symptoms of a flare.- Plan: Sedimentation rate  High risk medication use - Plaquenil 200 mg 1 tablet by mouth daily.  PLQ Eye Exam: 01/09/2022 WNL @ [facility] [place] up in 1 year--she is scheduled updated Plaquenil examination April 2025-she was given a new Plaquenil examination form to take with her to her upcoming appointment. CBC and CMP updated on 03/11/23.    - Plan: CBC with Differential/Platelet, COMPLETE METABOLIC PANEL WITH GFR  Primary osteoarthritis of both hands: She has PIP and DIP thickening consistent with osteoarthritis of both hands.  No synovitis noted.  Trochanteric bursitis of  left hip: Chronic pain.  Patient had a left trochanteric bursa cortisone injection performed on 11/03/2022.  Medial epicondylitis of elbow, left: Patient presents today with increased pain and stiffness in the left elbow.  Her symptoms have been intermittent over the past 2 weeks.  She has not been performing any overuse or repetitive activities.  Her symptoms are consistent with medial epicondylitis of the left elbow.  Handout exercises was provided.  If her symptoms persist or worsen we can place a referral for PT.   Primary osteoarthritis of both knees - X-rays of the right knee 02/27/2021 and results were consistent with mild to moderate lateral compartment and moderate patellofemoral compartment osteoarthritis.  Patient continues to have chronic pain and stiffness involving both knees.  No warmth or effusion noted.  Primary osteoarthritis of both feet: She is not experiencing any increased discomfort in her feet at this time.  She is wearing proper fitting shoes.  DDD (degenerative disc disease), cervical: X-rays of the cervical spine were obtained on 02/13/2021: Cervical spondylosis noted.  Possible encroachment of the right neural foramina at C3-C4 and C5-C6.  Patient presents today with increased pain and stiffness in the cervical spine which started 2 weeks ago.  No injury or change in activity prior to the onset of symptoms.  She has not had any symptoms of radiculopathy.  No nocturnal pain.  C-spine has limited range of motion with lateral rotation.  Offered to update x-rays today to assess for radiographic progression but she has declined.  Patient was given a handout of exercises to perform.  If her symptoms persist or worsen she can return for an x-ray and we can refer her to physical therapy.  Osteopenia of multiple sites - Previous DEXA 08/26/20 ordered by Dr. Lawerance Bach: LFN -1.4. DEXA updated on 10/22/2022: Femoral neck T-score -1.8.  Right femoral neck T-score -0.9.  Lumbar spine +0.2.    Other  medical conditions are listed as follows:  History of cancer of right breast  S/P right mastectomy  History of hyperlipidemia  History of hypertension: Blood pressure was elevated today in the office and was rechecked prior to leaving.  Patient was advised to monitor blood pressure closely to reach out to PCP if remains elevated.  History of peripheral neuropathy  History of gastroesophageal reflux (GERD)  Orders: Orders Placed This Encounter  Procedures   Anti-DNA antibody, double-stranded   C3 and C4   Sedimentation rate   CBC with Differential/Platelet   COMPLETE METABOLIC PANEL WITH GFR   Protein / creatinine ratio, urine   Serum protein electrophoresis with reflex   Rheumatoid factor   ANA   Sjogrens syndrome-A extractable nuclear antibody   Sjogrens syndrome-B extractable nuclear antibody   No orders of the defined types were placed in this encounter.    Follow-Up Instructions: Return in about 6 months (around 10/06/2023).   Gearldine Bienenstock, PA-C  Note - This record has been created using Dragon software.  Chart creation errors have been sought, but may not always  have been located. Such creation errors do not reflect on  the standard of medical care.

## 2023-03-25 ENCOUNTER — Telehealth: Payer: Self-pay

## 2023-03-25 NOTE — Telephone Encounter (Signed)
 Left voice mail for pt to cal back, number to the office provided

## 2023-03-26 ENCOUNTER — Other Ambulatory Visit (HOSPITAL_COMMUNITY): Payer: Self-pay | Admitting: Internal Medicine

## 2023-03-26 DIAGNOSIS — Z1231 Encounter for screening mammogram for malignant neoplasm of breast: Secondary | ICD-10-CM

## 2023-04-05 ENCOUNTER — Encounter: Payer: Self-pay | Admitting: Physician Assistant

## 2023-04-05 ENCOUNTER — Ambulatory Visit: Payer: 59 | Attending: Physician Assistant | Admitting: Physician Assistant

## 2023-04-05 VITALS — BP 167/92 | HR 61 | Resp 14 | Ht 69.0 in | Wt 182.0 lb

## 2023-04-05 DIAGNOSIS — M353 Polymyalgia rheumatica: Secondary | ICD-10-CM

## 2023-04-05 DIAGNOSIS — M19071 Primary osteoarthritis, right ankle and foot: Secondary | ICD-10-CM

## 2023-04-05 DIAGNOSIS — Z79899 Other long term (current) drug therapy: Secondary | ICD-10-CM

## 2023-04-05 DIAGNOSIS — M170 Bilateral primary osteoarthritis of knee: Secondary | ICD-10-CM | POA: Diagnosis not present

## 2023-04-05 DIAGNOSIS — Z8669 Personal history of other diseases of the nervous system and sense organs: Secondary | ICD-10-CM

## 2023-04-05 DIAGNOSIS — M5030 Other cervical disc degeneration, unspecified cervical region: Secondary | ICD-10-CM

## 2023-04-05 DIAGNOSIS — Z9011 Acquired absence of right breast and nipple: Secondary | ICD-10-CM | POA: Diagnosis not present

## 2023-04-05 DIAGNOSIS — M3501 Sicca syndrome with keratoconjunctivitis: Secondary | ICD-10-CM | POA: Diagnosis not present

## 2023-04-05 DIAGNOSIS — Z8679 Personal history of other diseases of the circulatory system: Secondary | ICD-10-CM

## 2023-04-05 DIAGNOSIS — Z8639 Personal history of other endocrine, nutritional and metabolic disease: Secondary | ICD-10-CM

## 2023-04-05 DIAGNOSIS — M19072 Primary osteoarthritis, left ankle and foot: Secondary | ICD-10-CM

## 2023-04-05 DIAGNOSIS — Z8719 Personal history of other diseases of the digestive system: Secondary | ICD-10-CM

## 2023-04-05 DIAGNOSIS — M8589 Other specified disorders of bone density and structure, multiple sites: Secondary | ICD-10-CM | POA: Diagnosis not present

## 2023-04-05 DIAGNOSIS — Z853 Personal history of malignant neoplasm of breast: Secondary | ICD-10-CM

## 2023-04-05 DIAGNOSIS — M7062 Trochanteric bursitis, left hip: Secondary | ICD-10-CM

## 2023-04-05 DIAGNOSIS — M19041 Primary osteoarthritis, right hand: Secondary | ICD-10-CM | POA: Diagnosis not present

## 2023-04-05 DIAGNOSIS — M7702 Medial epicondylitis, left elbow: Secondary | ICD-10-CM

## 2023-04-05 DIAGNOSIS — M19042 Primary osteoarthritis, left hand: Secondary | ICD-10-CM

## 2023-04-05 NOTE — Patient Instructions (Addendum)
 Exercises for Golfer's Elbow Elbow exercises can help you get better if you have golfer's elbow. Only do the exercises you were told to do. Make sure you know how to do the exercises safely. Follow the steps below. It's normal to feel mild discomfort. Stop if you feel pain or your pain gets worse. Do not start these exercises until told by your health care provider. Stretching and range-of-motion exercises These exercises warm up your muscles and joints. They can help your elbow move better and be more flexible. Wrist extension, assisted  Straighten your left / right elbow in front of you with your palm facing up toward the ceiling. If told by your provider, bend your left / right elbow to a 90-degree angle (right angle) at your side. Do this instead of holding it straight. With your other hand, gently pull your left / right hand and fingers toward the floor. Stop when you feel a gentle stretch on the palm side of your forearm. Hold this position for __________ seconds. Repeat __________ times. Do this exercise __________ times a day. Wrist flexion, assisted  Straighten your left / right elbow in front of you with your palm facing down toward the floor. If told by your provider, bend your left / right elbow to a 90-degree angle at your side. Do this instead of holding it straight. With your other hand, gently push over the back of your left / right hand so your fingers point toward the floor. Stop when you feel a gentle stretch on the back of your forearm. Hold this position for __________ seconds. Repeat __________ times. Do this exercise __________ times a day. Assisted forearm rotation, supination  Sit or stand with your elbows at your side. Bend your left / right elbow to a 90-degree angle. Using your uninjured hand, turn your left / right palm up toward the ceiling. Stop when you feel a gentle stretch along the inside of your forearm. Hold this position for __________ seconds. Repeat  __________ times. Do this exercise __________ times a day. Assisted forearm rotation, pronation  Sit or stand with your elbows at your side. Bend your left / right elbow to a 90-degree angle. Using your uninjured hand, turn your left / right palm down toward the floor. Stop when you feel a gentle stretch along the top of your forearm. Hold this position for __________ seconds. Repeat __________ times. Do this exercise __________ times a day. Strengthening exercises These exercises build strength and endurance in your elbow. Endurance is the ability to use your muscles for a long time, even after they get tired. Wrist flexion  Sit with your left / right forearm supported on a table or other surface. Turn your palm up toward the ceiling. Let your left / right wrist extend over the edge of the surface. Hold a __________ weight or a piece of rubber exercise band or tubing. If using a rubber exercise band or tubing, hold the other end of the tubing with your other hand. Slowly bend your wrist so your hand moves up toward the ceiling. Try to only move your wrist. Keep the rest of your arm still. Hold this position for __________ seconds. Slowly go back to the starting position. Repeat __________ times. Do this exercise __________ times a day. Wrist flexion, eccentric  Sit with your left / right forearm supported on a table or other surface. Turn your palm up toward the ceiling. Let your left / right wrist extend over the edge of the  surface. Hold a __________ weight or a piece of rubber exercise band or tubing in your left / right hand. If using a rubber exercise band or tubing, hold the other end of the tubing with your other hand. Use your uninjured hand to move your left / right hand up toward the ceiling. Take your other hand away. Slowly go back to the starting position using only your left / right hand. Repeat __________ times. Do this exercise __________ times a day. Forearm rotation,  pronation To do this exercise, you'll need a lightweight hammer or rubber mallet. Sit with your left / right forearm supported on a table or other surface. Bend your elbow to a 90-degree angle. Have your forearm so that your palm is facing up toward the ceiling, with your hand resting over the edge of the table. Hold a hammer in your left / right hand. To make this exercise easier, hold the hammer near the head of the hammer. To make this exercise harder, hold the hammer near the end of the handle. Without moving your elbow, slowly turn your forearm so your palm faces down toward the floor. Hold this position for __________ seconds. Slowly return to the starting position. Repeat __________ times. Do this exercise __________ times a day. Shoulder blade squeeze     Sit in a stable chair or stand with good posture. If you're sitting down, don't let your back touch the back of the chair. Your arms should be at your sides with your elbows bent to a 90-degree angle. Position your forearms so that your thumbs are facing the ceiling. Without lifting your shoulders up, squeeze your shoulder blades tightly together. Hold this position for __________ seconds. Slowly release. Go back to the starting position. Repeat __________ times. Do this exercise __________ times a day. This information is not intended to replace advice given to you by your health care provider. Make sure you discuss any questions you have with your health care provider. Document Revised: 07/30/2022 Document Reviewed: 07/30/2022 Elsevier Patient Education  2024 [employer] Neck Exercises Ask your health care provider which exercises are safe for you. Do exercises exactly as told by your health care provider and adjust them as directed. It is normal to feel mild stretching, pulling, tightness, or discomfort as you do these exercises. Stop right away if you feel sudden pain or your pain gets worse. Do not begin these exercises until  told by your health care provider. Neck exercises can be important for many reasons. They can improve strength and maintain flexibility in your neck, which will help your upper back and prevent neck pain. Stretching exercises Rotation neck stretching  Sit in a chair or stand up. Place your feet flat on the floor, shoulder-width apart. Slowly turn your head (rotate) to the right until a slight stretch is felt. Turn it all the way to the right so you can look over your right shoulder. Do not tilt or tip your head. Hold this position for 10-30 seconds. Slowly turn your head (rotate) to the left until a slight stretch is felt. Turn it all the way to the left so you can look over your left shoulder. Do not tilt or tip your head. Hold this position for 10-30 seconds. Repeat __________ times. Complete this exercise __________ times a day. Neck retraction  Sit in a sturdy chair or stand up. Look straight ahead. Do not bend your neck. Use your fingers to push your chin backward (retraction). Do not bend your  neck for this movement. Continue to face straight ahead. If you are doing the exercise properly, you will feel a slight sensation in your throat and a stretch at the back of your neck. Hold the stretch for 1-2 seconds. Repeat __________ times. Complete this exercise __________ times a day. Strengthening exercises Neck press  Lie on your back on a firm bed or on the floor with a pillow under your head. Use your neck muscles to push your head down on the pillow and straighten your spine. Hold the position as well as you can. Keep your head facing up (in a neutral position) and your chin tucked. Slowly count to 5 while holding this position. Repeat __________ times. Complete this exercise __________ times a day. Isometrics These are exercises in which you strengthen the muscles in your neck while keeping your neck still (isometrics). Sit in a supportive chair and place your hand on your  forehead. Keep your head and face facing straight ahead. Do not flex or extend your neck while doing isometrics. Push forward with your head and neck while pushing back with your hand. Hold for 10 seconds. Do the sequence again, this time putting your hand against the back of your head. Use your head and neck to push backward against the hand pressure. Finally, do the same exercise on either side of your head, pushing sideways against the pressure of your hand. Repeat __________ times. Complete this exercise __________ times a day. Prone head lifts  Lie face-down (prone position), resting on your elbows so that your chest and upper back are raised. Start with your head facing downward, near your chest. Position your chin either on or near your chest. Slowly lift your head upward. Lift until you are looking straight ahead. Then continue lifting your head as far back as you can comfortably stretch. Hold your head up for 5 seconds. Then slowly lower it to your starting position. Repeat __________ times. Complete this exercise __________ times a day. Supine head lifts  Lie on your back (supine position), bending your knees to point to the ceiling and keeping your feet flat on the floor. Lift your head slowly off the floor, raising your chin toward your chest. Hold for 5 seconds. Repeat __________ times. Complete this exercise __________ times a day. Scapular retraction  Stand with your arms at your sides. Look straight ahead. Slowly pull both shoulders (scapulae) backward and downward (retraction) until you feel a stretch between your shoulder blades in your upper back. Hold for 10-30 seconds. Relax and repeat. Repeat __________ times. Complete this exercise __________ times a day. Contact a health care provider if: Your neck pain or discomfort gets worse when you do an exercise. Your neck pain or discomfort does not improve within 2 hours after you exercise. If you have any of these  problems, stop exercising right away. Do not do the exercises again unless your health care provider says that you can. Get help right away if: You develop sudden, severe neck pain. If this happens, stop exercising right away. Do not do the exercises again unless your health care provider says that you can. This information is not intended to replace advice given to you by your health care provider. Make sure you discuss any questions you have with your health care provider. Document Revised: 07/02/2020 Document Reviewed: 07/02/2020 Elsevier Patient Education  2024 [employer].

## 2023-04-06 LAB — COMPLETE METABOLIC PANEL WITH GFR
AG Ratio: 1.2 (calc) (ref 1.0–2.5)
ALT: 11 U/L (ref 6–29)
AST: 16 U/L (ref 10–35)
Albumin: 3.9 g/dL (ref 3.6–5.1)
Alkaline phosphatase (APISO): 61 U/L (ref 37–153)
BUN/Creatinine Ratio: 14 (calc) (ref 6–22)
BUN: 14 mg/dL (ref 7–25)
CO2: 29 mmol/L (ref 20–32)
Calcium: 9.4 mg/dL (ref 8.6–10.4)
Chloride: 103 mmol/L (ref 98–110)
Creat: 1.03 mg/dL — ABNORMAL HIGH (ref 0.60–1.00)
Globulin: 3.3 g/dL (ref 1.9–3.7)
Glucose, Bld: 87 mg/dL (ref 65–99)
Potassium: 4 mmol/L (ref 3.5–5.3)
Sodium: 141 mmol/L (ref 135–146)
Total Bilirubin: 0.5 mg/dL (ref 0.2–1.2)
Total Protein: 7.2 g/dL (ref 6.1–8.1)
eGFR: 56 mL/min/{1.73_m2} — ABNORMAL LOW (ref >=60)

## 2023-04-06 NOTE — Progress Notes (Signed)
Complements WNL

## 2023-04-06 NOTE — Progress Notes (Signed)
 ESR remains elevated but stable.   WBC count is low-2.8. absolute lymphocytes and monocytes are low.  Rest of CBC wnl.  Any recent infections or medication changes? Creatinine is elevated-1.03 and Gfr is low-56. Rest of CMP WNL. Please clarify if she has been taking any NSAIDs? Urine protein creatinine ratio WNL RF negative

## 2023-04-07 NOTE — Progress Notes (Signed)
 Ro antibody remains positive. La antibody negative.  dsDNA negative.

## 2023-04-09 LAB — COMPREHENSIVE METABOLIC PANEL
AG Ratio: 1.2 (calc) (ref 1.0–2.5)
ALT: 11 U/L (ref 6–29)
AST: 16 U/L (ref 10–35)
Albumin: 3.9 g/dL (ref 3.6–5.1)
Alkaline phosphatase (APISO): 61 U/L (ref 37–153)
BUN/Creatinine Ratio: 14 (calc) (ref 6–22)
BUN: 14 mg/dL (ref 7–25)
CO2: 29 mmol/L (ref 20–32)
Calcium: 9.4 mg/dL (ref 8.6–10.4)
Chloride: 103 mmol/L (ref 98–110)
Creat: 1.03 mg/dL — ABNORMAL HIGH (ref 0.60–1.00)
Globulin: 3.3 g/dL (ref 1.9–3.7)
Glucose, Bld: 87 mg/dL (ref 65–99)
Potassium: 4 mmol/L (ref 3.5–5.3)
Sodium: 141 mmol/L (ref 135–146)
Total Bilirubin: 0.5 mg/dL (ref 0.2–1.2)
Total Protein: 7.2 g/dL (ref 6.1–8.1)
eGFR: 56 mL/min/{1.73_m2} — ABNORMAL LOW (ref >=60)

## 2023-04-09 LAB — PROTEIN ELECTROPHORESIS, SERUM, WITH REFLEX
Albumin ELP: 3.7 g/dL — ABNORMAL LOW (ref 3.8–4.8)
Alpha 1: 0.3 g/dL (ref 0.2–0.3)
Alpha 2: 0.8 g/dL (ref 0.5–0.9)
Beta 2: 0.7 g/dL — ABNORMAL HIGH (ref 0.2–0.5)
Beta Globulin: 0.6 g/dL (ref 0.4–0.6)
Gamma Globulin: 1.2 g/dL (ref 0.8–1.7)
Total Protein: 7.4 g/dL (ref 6.1–8.1)

## 2023-04-09 LAB — CBC WITH DIFFERENTIAL/PLATELET
Absolute Lymphocytes: 840 {cells}/uL — ABNORMAL LOW (ref 850–3900)
Absolute Monocytes: 190 {cells}/uL — ABNORMAL LOW (ref 200–950)
Basophils Absolute: 11 {cells}/uL (ref 0–200)
Basophils Relative: 0.4 %
Eosinophils Absolute: 31 {cells}/uL (ref 15–500)
Eosinophils Relative: 1.1 %
HCT: 39.4 % (ref 35.0–45.0)
Hemoglobin: 12.6 g/dL (ref 11.7–15.5)
MCH: 30 pg (ref 27.0–33.0)
MCHC: 32 g/dL (ref 32.0–36.0)
MCV: 93.8 fL (ref 80.0–100.0)
MPV: 11.7 fL (ref 7.5–12.5)
Monocytes Relative: 6.8 %
Neutro Abs: 1728 {cells}/uL (ref 1500–7800)
Neutrophils Relative %: 61.7 %
Platelets: 209 10*3/uL (ref 140–400)
RBC: 4.2 10*6/uL (ref 3.80–5.10)
RDW: 12.5 % (ref 11.0–15.0)
Total Lymphocyte: 30 %
WBC: 2.8 10*3/uL — ABNORMAL LOW (ref 3.8–10.8)

## 2023-04-09 LAB — SJOGRENS SYNDROME-B EXTRACTABLE NUCLEAR ANTIBODY: SSB (La) (ENA) Antibody, IgG: <1 AI

## 2023-04-09 LAB — SJOGRENS SYNDROME-A EXTRACTABLE NUCLEAR ANTIBODY: SSA (Ro) (ENA) Antibody, IgG: 5 AI — AB

## 2023-04-09 LAB — PROTEIN / CREATININE RATIO, URINE
Creatinine, Urine: 105 mg/dL (ref 20–275)
Protein/Creat Ratio: 76 mg/g{creat} (ref 24–184)
Protein/Creatinine Ratio: 0.076 mg/mg{creat} (ref 0.024–0.184)
Total Protein, Urine: 8 mg/dL (ref 5–24)

## 2023-04-09 LAB — IFE INTERPRETATION

## 2023-04-09 LAB — ANTI-NUCLEAR AB-TITER (ANA TITER): ANA Titer 1: 1:320 {titer} — ABNORMAL HIGH

## 2023-04-09 LAB — C3 AND C4
C3 Complement: 128 mg/dL (ref 83–193)
C4 Complement: 34 mg/dL (ref 15–57)

## 2023-04-09 LAB — ANTI-DNA ANTIBODY, DOUBLE-STRANDED: ds DNA Ab: <1 [IU]/mL

## 2023-04-09 LAB — ANA: Anti Nuclear Antibody (ANA): POSITIVE — AB

## 2023-04-09 LAB — SEDIMENTATION RATE: Sed Rate: 41 mm/h — ABNORMAL HIGH (ref 0–30)

## 2023-04-09 LAB — RHEUMATOID FACTOR: Rheumatoid fact SerPl-aCnc: <10 [IU]/mL (ref <14)

## 2023-04-12 NOTE — Progress Notes (Signed)
IFE normal pattern

## 2023-04-19 ENCOUNTER — Ambulatory Visit (HOSPITAL_COMMUNITY)
Admission: RE | Admit: 2023-04-19 | Discharge: 2023-04-19 | Disposition: A | Discharge status: Home / Self Care | Source: Ambulatory Visit | Attending: Internal Medicine | Admitting: Internal Medicine

## 2023-04-19 ENCOUNTER — Other Ambulatory Visit (HOSPITAL_COMMUNITY): Payer: Self-pay | Admitting: Internal Medicine

## 2023-04-19 DIAGNOSIS — Z1231 Encounter for screening mammogram for malignant neoplasm of breast: Secondary | ICD-10-CM | POA: Diagnosis not present

## 2023-04-21 ENCOUNTER — Other Ambulatory Visit: Payer: Self-pay | Admitting: Internal Medicine

## 2023-05-03 ENCOUNTER — Ambulatory Visit: Payer: Self-pay | Admitting: *Deleted

## 2023-05-03 NOTE — Telephone Encounter (Signed)
  Chief Complaint: dizziness. Blood sugar 95 fasting , BP 111/59. Symptoms: see above. Reports dizziness as mild hx Afib. Denies irregular heartrate. Patient went to walk and c/o dizziness. Eating now.  Frequency: this am  Pertinent Negatives: Patient denies chest pain no difficulty breathing no fever Disposition: [] ED /[] Urgent Care (no appt availability in office) / [] Appointment(In office/virtual)/ []  [facility]/ [] Home Care/ [] Refused Recommended Disposition /[] [facility]/ [x]  Follow-up with PCP Additional Notes:   Offered appt for tomorrow. Patient would like to see if PCP wants to review medication and see if PCP feels OV needed. Please advise      Copied from CRM [id number]. Topic: Clinical - Red Word Triage >> May 03, 2023 10:38 AM Adonis Hoot wrote: Red Word that prompted transfer to Nurse Triage: low blood sugar/low blood pressure-little dizziness Reason for Disposition  Taking a medicine that could cause dizziness (e.g., blood pressure medications, diuretics)  Answer Assessment - Initial Assessment Questions 1. DESCRIPTION: "Describe your dizziness."     Lightheaded  2. LIGHTHEADED: "Do you feel lightheaded?" (e.g., somewhat faint, woozy, weak upon standing)     Weakness standing  3. VERTIGO: "Do you feel like either you or the room is spinning or tilting?" (i.e. vertigo)     no 4. SEVERITY: "How bad is it?"  "Do you feel like you are going to faint?" "Can you stand and walk?"   - MILD: Feels slightly dizzy, but walking normally.   - MODERATE: Feels unsteady when walking, but not falling; interferes with normal activities (e.g., school, work).   - SEVERE: Unable to walk without falling, or requires assistance to walk without falling; feels like passing out now.      Mild  5. ONSET:  "When did the dizziness begin?"     This am  6. AGGRAVATING FACTORS: "Does anything make it worse?" (e.g., standing, change in head position)     Standing  7. HEART RATE:  "Can you tell me your heart rate?" "How many beats in 15 seconds?"  (Note: not all patients can do this)       Hx Afib  8. CAUSE: "What do you think is causing the dizziness?"     Not sure  9. RECURRENT SYMPTOM: "Have you had dizziness before?" If Yes, ask: "When was the last time?" "What happened that time?"     No  10. OTHER SYMPTOMS: "Do you have any other symptoms?" (e.g., fever, chest pain, vomiting, diarrhea, bleeding)       BS 95 fasting , BP 111/59 this am , mild dizziness 11. PREGNANCY: "Is there any chance you are pregnant?" "When was your last menstrual period?"       na  Protocols used: Dizziness - Lightheadedness-A-AH

## 2023-05-03 NOTE — Telephone Encounter (Signed)
 Spoke with patient today and she is no longer dizzy.  Said she felt it briefly this morning but none since then. She thinks she may have just been doing a bit to much this morning.  She does have BP monitor at home and will check it for the next couple of days and let us  know what her readings are.  She knows to call us  back if dizziness returns before then so we can possible have her come in.

## 2023-05-04 NOTE — Telephone Encounter (Signed)
 Agree-no office visit needed if lightheadedness has resolved.  Agree with monitoring blood pressure.  If lightheadedness recurs will need appointment.

## 2023-05-05 ENCOUNTER — Other Ambulatory Visit: Payer: Self-pay | Admitting: Internal Medicine

## 2023-05-10 ENCOUNTER — Other Ambulatory Visit: Payer: Self-pay | Admitting: Rheumatology

## 2023-05-11 NOTE — Telephone Encounter (Signed)
 Last Fill: 01/05/2023  Eye exam: 01/09/2022 WNL @ [facility] up in 1 year--she is scheduled updated Plaquenil  examination April 2025    Labs: 04/05/2023 WBC count is low-2.8. absolute lymphocytes and monocytes are low.  Rest of CBC wnl.   Creatinine is elevated-1.03 and Gfr is low-56. Rest of CMP WNL.   Next Visit: 10/12/2023  Last Visit: 04/05/2023  DX: Sjogren's syndrome with keratoconjunctivitis sicca    Current Dose per office note 04/05/2023: Plaquenil  200 mg 1 tablet by mouth daily   Okay to refill Plaquenil ?

## 2023-05-12 ENCOUNTER — Telehealth: Payer: Self-pay | Admitting: Internal Medicine

## 2023-05-12 NOTE — Telephone Encounter (Signed)
BP looks good

## 2023-05-12 NOTE — Telephone Encounter (Signed)
 Copied from CRM [id number]. Topic: General - Other >> May 12, 2023  9:28 AM Tiffany S wrote: Reason for CRM: Patient called to give blood glucose and BP readings   19th 109     BP 116/56 20th 10       BP  129/69 21st 93        BP  115/63    22nd   97     BP 125/75                  23rd   99       BP 125/79

## 2023-05-13 ENCOUNTER — Telehealth: Payer: Self-pay | Admitting: *Deleted

## 2023-05-13 NOTE — Telephone Encounter (Signed)
 Angela Simmons called the office on behalf of her mother. Angela Simmons is on dpr. She states she was checking her mothers my chart and was wondering if patient needs to have labs done. Advised Angela Simmons that Angela Simmons just had labs in March and is not currently due for labs. Advised her that what she may be seeing is the lab order that were placed a year ago at an appointment she came to. [name] expressed understanding.

## 2023-05-20 DIAGNOSIS — H40021 Open angle with borderline findings, high risk, right eye: Secondary | ICD-10-CM | POA: Diagnosis not present

## 2023-05-31 ENCOUNTER — Other Ambulatory Visit: Payer: Self-pay | Admitting: Cardiology

## 2023-06-17 IMAGING — MG DIGITAL SCREENING UNILAT LEFT W/ TOMO W/ CAD
4 series · 4 of 12 positions shown · non-contrast
Comparison: Previous exam(s).

ACR Breast Density Category a: The breast tissue is almost entirely
fatty.

CLINICAL DATA: Screening.

EXAM:
DIGITAL SCREENING UNILATERAL LEFT MAMMOGRAM WITH CAD AND
TOMOSYNTHESIS
TECHNIQUE: Left screening digital craniocaudal and mediolateral oblique
mammograms were obtained. Left screening digital breast
tomosynthesis was performed. The images were evaluated with
computer-aided detection.

[L CC synth-2D]
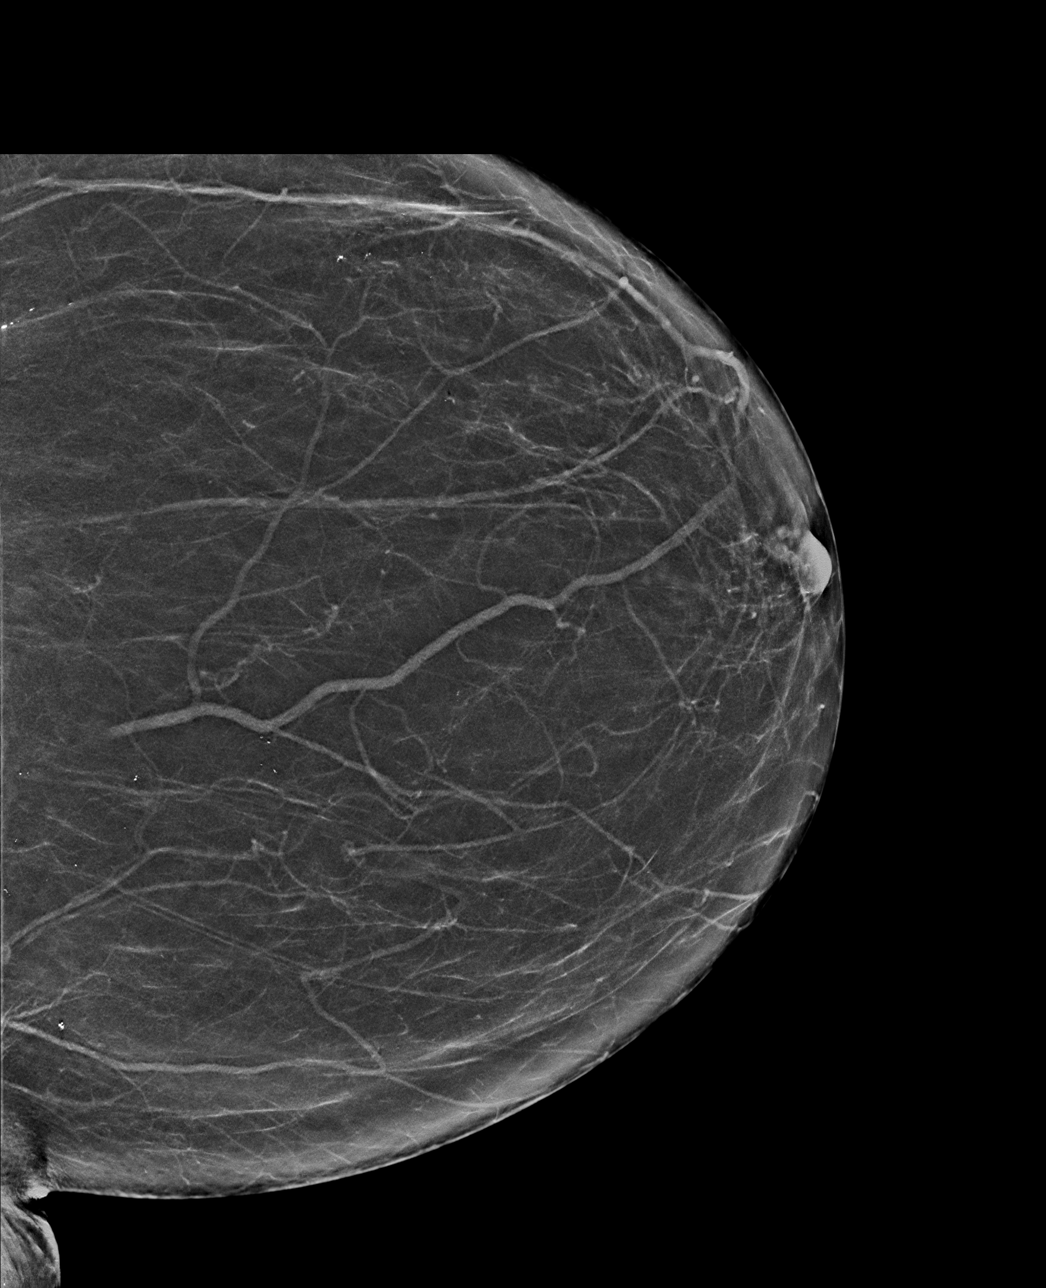

[L MLO synth-2D]
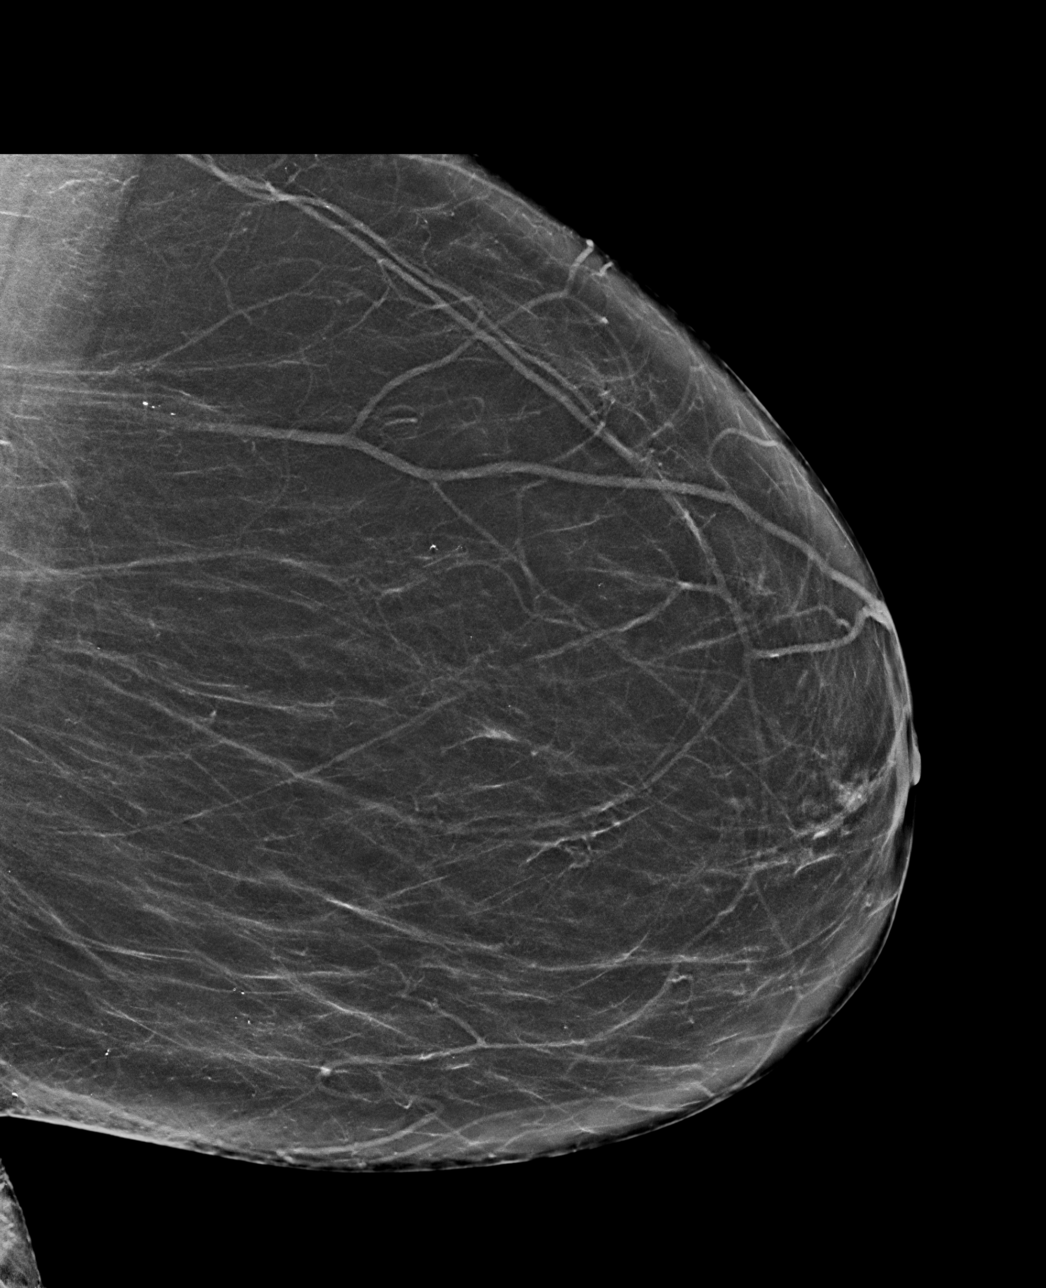

[L CC tomo · tomo slice 33/66.0]
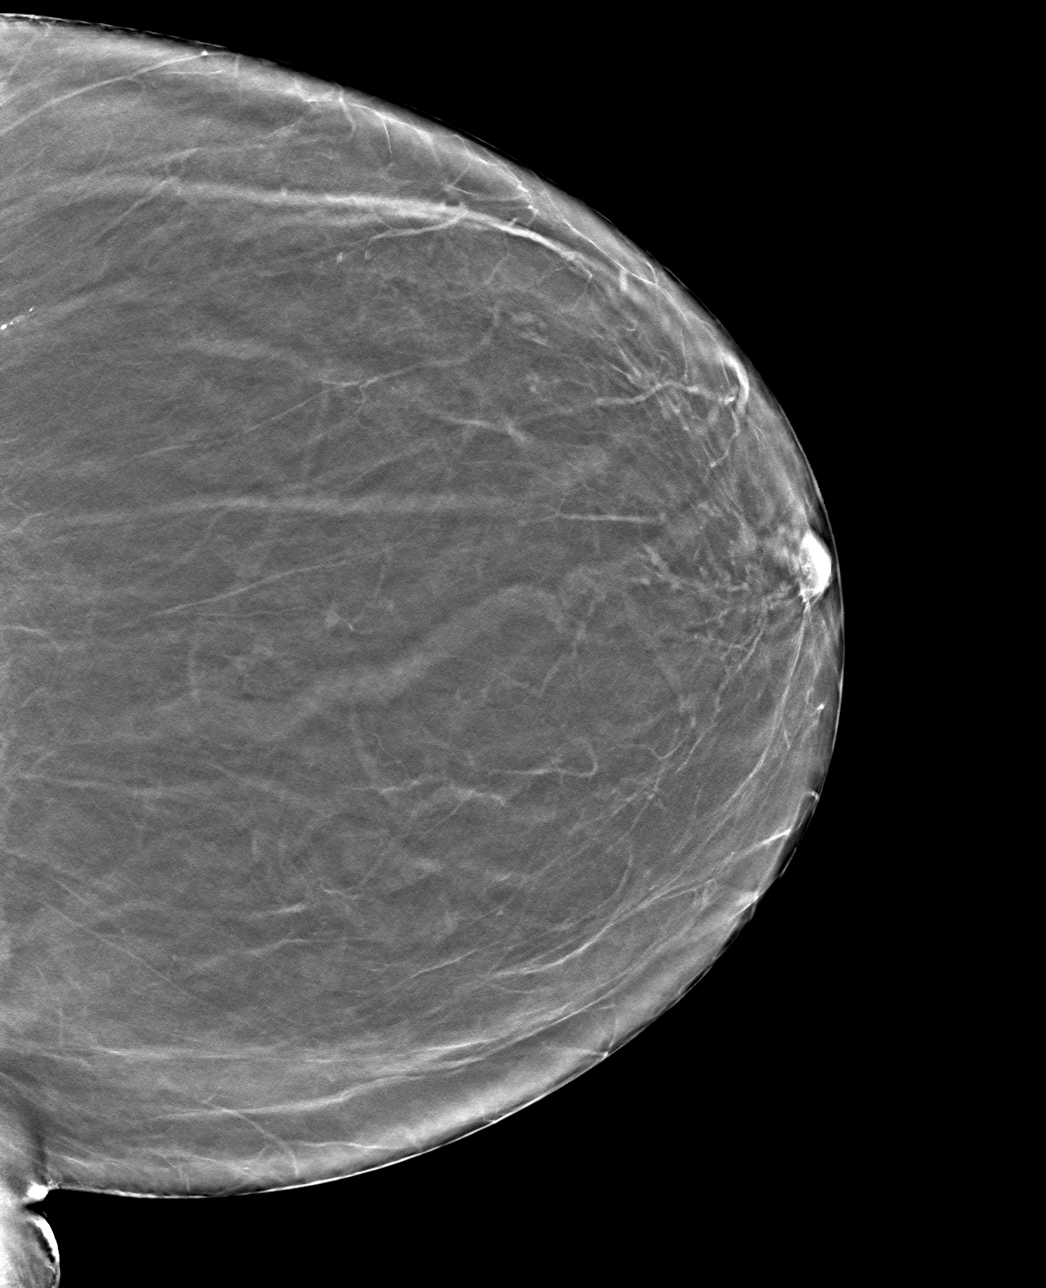

[L MLO tomo · tomo slice 37/72.0]
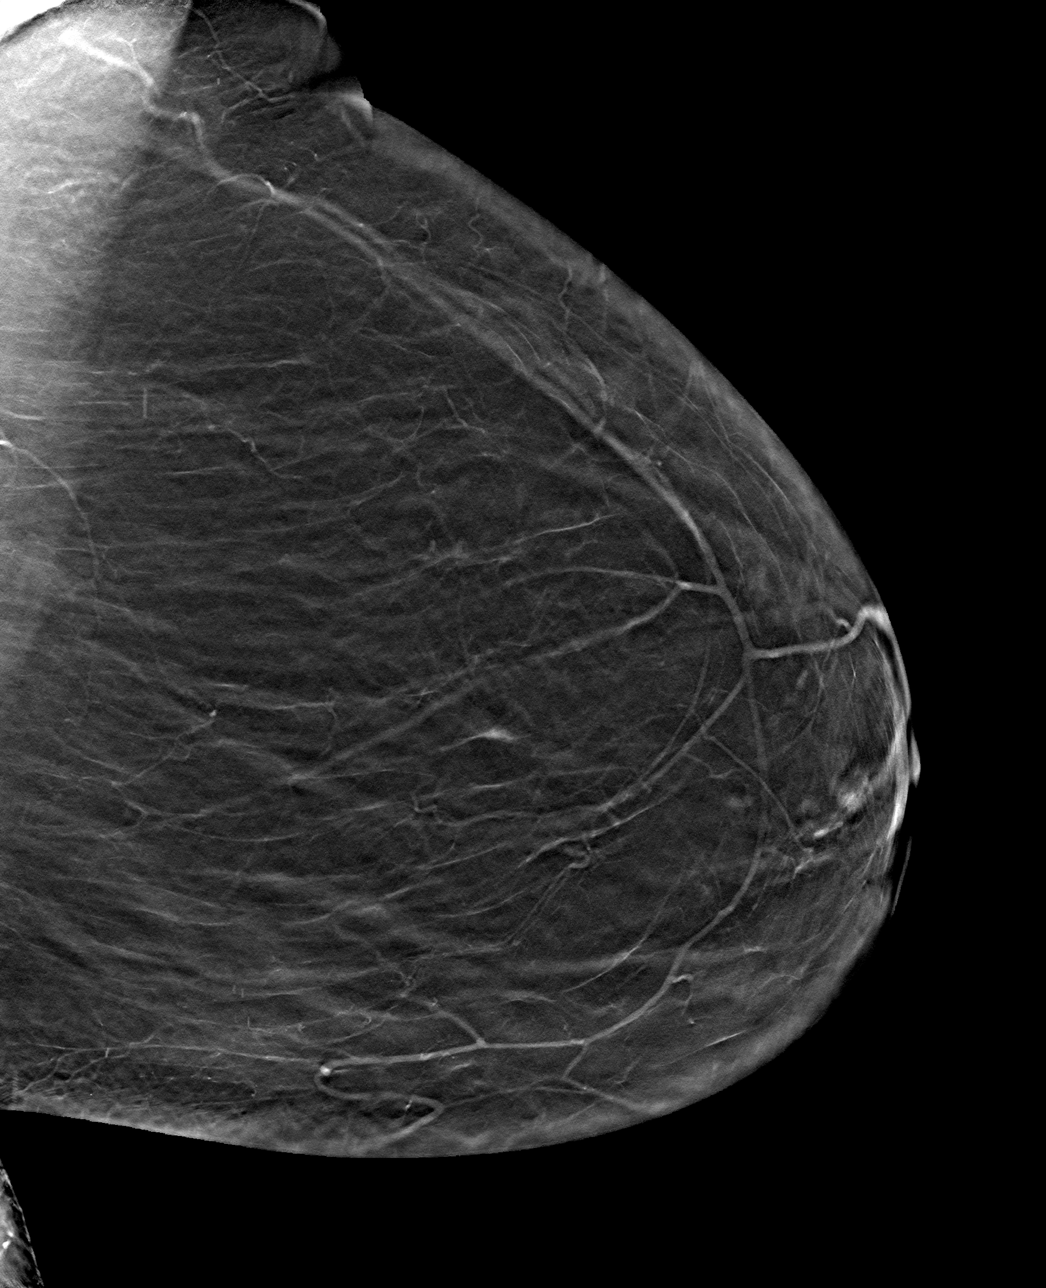

[4 of 12 positions shown; findings below may reference images not displayed]

FINDINGS: There are no findings suspicious for malignancy.
IMPRESSION: No mammographic evidence of malignancy. A result letter of this
screening mammogram will be mailed directly to the patient.

RECOMMENDATION:
Screening mammogram in one year. (Code:88-1-XIT)

BI-RADS CATEGORY  1: Negative.

## 2023-06-24 DIAGNOSIS — E1142 Type 2 diabetes mellitus with diabetic polyneuropathy: Secondary | ICD-10-CM | POA: Diagnosis not present

## 2023-06-24 DIAGNOSIS — B351 Tinea unguium: Secondary | ICD-10-CM | POA: Diagnosis not present

## 2023-06-24 DIAGNOSIS — L84 Corns and callosities: Secondary | ICD-10-CM | POA: Diagnosis not present

## 2023-06-24 DIAGNOSIS — M79676 Pain in unspecified toe(s): Secondary | ICD-10-CM | POA: Diagnosis not present

## 2023-07-06 ENCOUNTER — Other Ambulatory Visit: Payer: Self-pay | Admitting: Internal Medicine

## 2023-07-26 ENCOUNTER — Other Ambulatory Visit: Payer: Self-pay | Admitting: Internal Medicine

## 2023-07-30 ENCOUNTER — Other Ambulatory Visit: Payer: Self-pay | Admitting: Internal Medicine

## 2023-08-08 ENCOUNTER — Other Ambulatory Visit: Payer: Self-pay | Admitting: Cardiology

## 2023-08-09 NOTE — Telephone Encounter (Signed)
 Prescription refill request for Eliquis  received. Indication: A Flutter Last office visit: 03/15/23  JINNY Ross MD Scr: 1.03 on 04/05/23  Epic Age: 78 Weight: 83.6kg  Based on above findings Eliquis  5mg  twice daily is the appropriate dose.  Refill approved.

## 2023-08-16 ENCOUNTER — Other Ambulatory Visit: Payer: Self-pay | Admitting: Rheumatology

## 2023-08-16 NOTE — Telephone Encounter (Signed)
 Last Fill: 05/11/2023  Eye exam: 01/09/2022 WNL    Contacted the patient she says she had one, and is going to call them to have them fax over   Labs: 04/05/2023 Ro antibody remains positive. La antibody negative.  dsDNA negative. IFE normal pattern   Next Visit: 10/12/2023  Last Visit: 04/05/2023  IK:Dgnhmzw'd syndrome with keratoconjunctivitis sicca   Current Dose per office note 04/03/2023: Plaquenil  200 mg 1 tablet by mouth daily   Okay to refill Plaquenil ?

## 2023-08-23 DIAGNOSIS — E119 Type 2 diabetes mellitus without complications: Secondary | ICD-10-CM | POA: Diagnosis not present

## 2023-08-30 ENCOUNTER — Other Ambulatory Visit: Payer: Self-pay | Admitting: Internal Medicine

## 2023-09-16 DIAGNOSIS — B351 Tinea unguium: Secondary | ICD-10-CM | POA: Diagnosis not present

## 2023-09-16 DIAGNOSIS — M79675 Pain in left toe(s): Secondary | ICD-10-CM | POA: Diagnosis not present

## 2023-09-16 DIAGNOSIS — M79674 Pain in right toe(s): Secondary | ICD-10-CM | POA: Diagnosis not present

## 2023-09-16 DIAGNOSIS — L84 Corns and callosities: Secondary | ICD-10-CM | POA: Diagnosis not present

## 2023-09-16 DIAGNOSIS — E1142 Type 2 diabetes mellitus with diabetic polyneuropathy: Secondary | ICD-10-CM | POA: Diagnosis not present

## 2023-09-22 ENCOUNTER — Ambulatory Visit

## 2023-09-22 VITALS — BP 142/80 | HR 60 | Ht 69.0 in | Wt 175.6 lb

## 2023-09-22 DIAGNOSIS — E1140 Type 2 diabetes mellitus with diabetic neuropathy, unspecified: Secondary | ICD-10-CM | POA: Diagnosis not present

## 2023-09-22 DIAGNOSIS — Z0000 Encounter for general adult medical examination without abnormal findings: Secondary | ICD-10-CM

## 2023-09-22 NOTE — Progress Notes (Signed)
 Subjective:   Angela Simmons is a 78 y.o. who presents for a Medicare Wellness preventive visit.  As a reminder, Annual Wellness Visits don't include a physical exam, and some assessments may be limited, especially if this visit is performed virtually. We may recommend an in-person follow-up visit with your provider if needed.  Visit Complete: In person   Persons Participating in Visit: Patient.  AWV Questionnaire: Yes: Patient Medicare AWV questionnaire was completed by the patient on 09/21/2023; I have confirmed that all information answered by patient is correct and no changes since this date.  Cardiac Risk Factors include: advanced age (>44men, >54 women);diabetes mellitus;dyslipidemia;hypertension;Other (see comment), Risk factor comments: A. FIb     Objective:    Today's Vitals   09/22/23 1045  BP: (!) 142/80  Pulse: 60  Weight: 175 lb 9.6 oz (79.7 kg)  Height: 5' 9 (1.753 m)   Body mass index is 25.93 kg/m.     09/22/2023   10:45 AM 08/20/2022   11:19 AM 05/13/2022   11:31 AM 02/16/2022   12:11 PM 12/07/2019   10:11 AM 08/22/2019    4:52 PM 07/22/2019    9:40 PM  Advanced Directives  Does Patient Have a Medical Advance Directive? Yes Yes No Yes Yes Yes Yes  Type of Estate agent of [place];Living will Healthcare Power of [place];Living will  Healthcare Power of [place];Living will Healthcare Power of [place];Living will Healthcare Power of [place];Living will Healthcare Power of Attorney  Does patient want to make changes to medical advance directive?    No - Guardian declined   No - Patient declined  Copy of Healthcare Power of Attorney in Chart? No - copy requested No - copy requested  No - copy requested, Physician notified   No - copy requested  Would patient like information on creating a medical advance directive?   No - Patient declined  No - Patient declined No - Patient declined No - Patient declined    Current Medications  (verified) Outpatient Encounter Medications as of 09/22/2023  Medication Sig   ACCU-CHEK GUIDE TEST test strip USE 1 STRIP TO CHECK GLUCOSE ONCE DAILY   apixaban  (ELIQUIS ) 5 MG TABS tablet Take 1 tablet by mouth twice daily   atorvastatin  (LIPITOR) 10 MG tablet Take 1 tablet by mouth once daily   benazepril  (LOTENSIN ) 40 MG tablet Take 1 tablet by mouth once daily   Blood Glucose Monitoring Suppl (ONETOUCH VERIO) w/Device KIT USE TO TEST BLOOD SUGAR ONCE DAILY AS DIRECTED   diclofenac  sodium (VOLTAREN ) 1 % GEL Apply 3 g to 3 large joints up to 3 times daily. (Patient taking differently: Apply 3 g topically 3 (three) times daily as needed (pain).)   diltiazem  (DILT-XR) 120 MG 24 hr capsule Take 1 capsule by mouth once daily   fluticasone  (FLONASE ) 50 MCG/ACT nasal spray Place 1 spray into both nostrils as needed for allergies or rhinitis.   hydrochlorothiazide  (HYDRODIURIL ) 12.5 MG tablet Take 1 tablet by mouth once daily   hydroxychloroquine  (PLAQUENIL ) 200 MG tablet Take 1 tablet by mouth once daily   loratadine  (CLARITIN ) 10 MG tablet TAKE 1 TABLET BY MOUTH EVERY DAY   Magnesium 250 MG TABS Take 250 mg by mouth daily.   metoprolol  tartrate (LOPRESSOR ) 25 MG tablet Take 1 tablet by mouth twice daily   Multiple Vitamins-Minerals (CENTRUM SILVER  50+WOMEN) TABS Take 1 tablet by mouth daily at 6 (six) AM.   Omega-3 Fatty Acids (FISH OIL) 1000 MG CAPS Take  1,000 mg by mouth daily.   omeprazole  (PRILOSEC) 40 MG capsule Take 1 capsule by mouth once daily   ONETOUCH DELICA LANCETS 30G MISC USE TO TEST ONCE DAILY   ONETOUCH VERIO test strip USE TO TEST BLOOD SUGAR ONCE DAILY   Polyvinyl Alcohol-Povidone (REFRESH OP) Apply to eye 2 (two) times daily.   No facility-administered encounter medications on file as of 09/22/2023.    Allergies (verified) Levofloxacin, Nitrofurantoin, Penicillins, Sulfonamide derivatives, and Pantoprazole    History: Past Medical History:  Diagnosis Date   Breast cancer  (HCC) 1990s   right breast   Collagen vascular disease (HCC)    Diabetes mellitus without complication (HCC)    GERD (gastroesophageal reflux disease)    HTN (hypertension)    Hypercholesterolemia    Obesity    Osteoarthritis    Past Surgical History:  Procedure Laterality Date   ABDOMINAL HY[place]CTOMY  [place]   AORTIC ARCH ANGIOGRAPHY N/A 12/07/2019   Procedure: AORTIC ARCH ANGIOGRAPHY;  Surgeon: Court Dorn PARAS, MD;  [facility];  Service: Cardiovascular;  Laterality: N/A;   COLONOSCOPY  May 2003   Dr. Claudene: normal   COLONOSCOPY  06/22/2011   Procedure: COLONOSCOPY;  Surgeon: Margo LITTIE Haddock, MD;  Location: [facility];  Service: Endoscopy;  Laterality: N/A;  10:30   COLONOSCOPY WITH PROPOFOL  N/A 05/13/2022   Procedure: COLONOSCOPY WITH PROPOFOL ;  Surgeon: Shaaron Lamar HERO, MD;  Location: [facility];  Service: Endoscopy;  Laterality: N/A;  1:15 PM   LEFT HEART CATH AND CORONARY ANGIOGRAPHY N/A 12/07/2019   Procedure: LEFT HEART CATH AND CORONARY ANGIOGRAPHY;  Surgeon: Court Dorn PARAS, MD;  Location: [facility];  Service: Cardiovascular;  Laterality: N/A;   MASTECTOMY  1995   right    POLYPECTOMY  05/13/2022   Procedure: POLYPECTOMY;  Surgeon: Shaaron Lamar HERO, MD;  [facility]: [facility];  Service: Endoscopy;;   Family History  Problem Relation Age of Onset   Diabetes Mother        diabetic coma   Heart failure Mother    Diabetes Father    Emphysema Father    Diabetes Sister    Hypercholesterolemia Sister    Diabetes Sister    Hypertension Sister    Hypercholesterolemia Sister    Hypercholesterolemia Sister    Hypercholesterolemia Sister    Hypercholesterolemia Brother    Diabetes Brother    Hypertension Brother    Hypercholesterolemia Brother    Diabetes Brother    Hypertension Brother    Colon cancer Neg Hx    Social History   Socioeconomic History   Marital status: Widowed    Spouse name: Not on file   Number of  children: 3   Years of education: 7   Highest education level: 4th grade  Occupational History   Not on file  Tobacco Use   Smoking status: Never    Passive exposure: Never   Smokeless tobacco: Never  Vaping Use   Vaping status: Never Used  Substance and Sexual Activity   Alcohol use: No   Drug use: Never   Sexual activity: Not Currently    Birth control/protection: Rhythm  Other Topics Concern   Not on file  Social History Narrative   Widowed   Caffeine, maybe 1 a day   Social Drivers of Health   Financial Resource Strain: Low Risk  (09/22/2023)   Overall Financial Resource Strain (CARDIA)    Difficulty of Paying Living Expenses: Not very  hard  Food Insecurity: No Food Insecurity (09/22/2023)   Hunger Vital Sign    Worried About Running Out of Food in the Last Year: Never true    Ran Out of Food in the Last Year: Never true  Transportation Needs: No Transportation Needs (09/22/2023)   PRAPARE - Administrator, Civil Service (Medical): No    Lack of Transportation (Non-Medical): No  Physical Activity: Insufficiently Active (09/22/2023)   Exercise Vital Sign    Days of Exercise per Week: 2 days    Minutes of Exercise per Session: 30 min  Stress: No Stress Concern Present (09/22/2023)   [employer] of Occupational Health - Occupational Stress Questionnaire    Feeling of Stress: Not at all  Social Connections: Moderately Isolated (09/22/2023)   Social Connection and Isolation Panel    Frequency of Communication with Friends and Family: More than three times a week    Frequency of Social Gatherings with Friends and Family: More than three times a week    Attends Religious Services: More than 4 times per year    Active Member of [employer] or Organizations: No    Attends Banker Meetings: Never    Marital Status: Widowed    Tobacco Counseling Counseling given: Not Answered    Clinical Intake:  Pre-visit preparation completed: Yes  Pain : No/denies  pain     BMI - recorded: 25.93 Nutritional Status: BMI 25 -29 Overweight Nutritional Risks: None Diabetes: Yes CBG done?: Yes CBG resulted in Enter/ Edit results?: Yes (fasting - 95) Did pt. bring in CBG monitor from home?: No  Lab Results  Component Value Date   HGBA1C 6.2 03/11/2023   HGBA1C 6.2 10/15/2022   HGBA1C 6.2 04/08/2022     How often do you need to have someone help you when you read instructions, pamphlets, or other written materials from your doctor or pharmacy?: 1 - Never  Interpreter Needed?: No  Information entered by :: Verdie Saba, CMA   Activities of Daily Living     09/22/2023   10:47 AM 09/21/2023    7:39 PM  In your present state of health, do you have any difficulty performing the following activities:  Hearing? 0 1  Comment wears hearing aids   Vision? 0 0  Difficulty concentrating or making decisions? 0   Walking or climbing stairs? 0 0  Dressing or bathing? 0 0  Doing errands, shopping? 0 0  Preparing Food and eating ? N N  Using the Toilet? N N  In the past six months, have you accidently leaked urine? Y Y  Do you have problems with loss of bowel control? N N  Managing your Medications? N N  Managing your Finances? N N  Housekeeping or managing your Housekeeping? N N    Patient Care Team: Geofm Glade PARAS, MD as PCP - General (Internal Medicine) Alvan Dorn FALCON, MD as PCP - Cardiology (Cardiology) Dolphus Reiter, MD as Consulting Physician (Rheumatology) Fate Morna SAILOR, [facility] (Inactive) as Pharmacist (Pharmacist) Shaaron Lamar HERO, MD as Consulting Physician (Gastroenterology) Abigail, Maude POUR [facility])  I have updated your Care Teams any recent Medical Services you may have received from other providers in the past year.     Assessment:   This is a routine wellness examination for Miyani.  Hearing/Vision screen Hearing Screening - Comments:: Wears hearing aids Vision Screening - Comments:: Wears rx glasses - up to date with  routine eye exams with Maude Abigail   Goals Addressed  This Visit's Progress     Patient Stated (pt-stated)        Patient stated she plans to continue exercising        Depression Screen     09/22/2023   10:49 AM 08/20/2022   11:24 AM 01/05/2022   10:33 AM 01/03/2021   11:02 AM 12/27/2019   12:07 PM 12/06/2018   11:06 AM 03/18/2017   12:37 PM  PHQ 2/9 Scores  PHQ - 2 Score 0 1 2 0 0 0 0  PHQ- 9 Score 0 1 5        Fall Risk     09/22/2023   10:48 AM 09/21/2023    7:39 PM 03/11/2023   10:26 AM 08/20/2022   11:20 AM 08/20/2022   10:15 AM  Fall Risk   Falls in the past year? 0 0 0 0 0  Number falls in past yr: 0 0 0 0   Injury with Fall? 0 0 0 0 0  Risk for fall due to : No Fall Risks  No Fall Risks No Fall Risks   Follow up Falls evaluation completed;Falls prevention discussed  Falls evaluation completed Falls prevention discussed     MEDICARE RISK AT HOME:  Medicare Risk at Home Any stairs in or around the home?: No If so, are there any without handrails?: No Home free of loose throw rugs in walkways, pet beds, electrical cords, etc?: Yes Adequate lighting in your home to reduce risk of falls?: Yes Life alert?: No Use of a cane, walker or w/c?: No Grab bars in the bathroom?: No Shower chair or bench in shower?: No Elevated toilet seat or a handicapped toilet?: No  TIMED UP AND GO:  Was the test performed?  No  Cognitive Function: 6CIT completed    02/25/2017    5:02 PM 02/21/2016   10:35 AM  MMSE - Mini Mental State Exam  Orientation to time 5  5   Orientation to Place 5  5   Registration 3  3   Attention/ Calculation 4  0   Attention/Calculation-comments  patient states she cannot spell or count   Recall 2  3   Language- name 2 objects 2  2   Language- repeat 1 1  Language- follow 3 step command 3  3   Language- read & follow direction 1  1   Write a sentence 1  1   Copy design 1  0   Total score 28  24      Data saved with a previous  flowsheet row definition        09/22/2023   10:51 AM 08/20/2022   11:20 AM  6CIT Screen  What Year? 0 points 0 points  What month? 0 points 0 points  What time? 0 points 0 points  Count back from 20 0 points 4 points  Months in reverse 0 points 4 points  Repeat phrase 2 points 4 points  Total Score 2 points 12 points    Immunizations Immunization History  Administered Date(s) Administered   Fluad Quad(high Dose 65+) 12/06/2018, 12/27/2019, 01/03/2021   Fluad Trivalent(High Dose 65+) 10/15/2022   INFLUENZA, HIGH DOSE SEASONAL PF 11/22/2014, 02/21/2016, 12/18/2016, 11/05/2017   Influenza Split 11/14/2010   Influenza Whole 10/18/2006, 10/31/2008, 11/06/2009   Influenza,inj,Quad PF,6+ Mos 11/14/2012, 11/23/2013   Moderna Sars-Covid-2 Vaccination 03/16/2019, 04/13/2019   Pneumococcal Conjugate-13 04/11/2014   Pneumococcal Polysaccharide-23 09/17/2011   Td 07/05/2003   Zoster Recombinant(Shingrix) 11/30/2017, 02/02/2018   Zoster, Live  06/15/2013    Screening Tests Health Maintenance  Topic Date Due   DTaP/Tdap/Td (2 - Tdap) 07/04/2013   Diabetic kidney evaluation - Urine ACR  11/24/2014   INFLUENZA VACCINE  08/20/2023   HEMOGLOBIN A1C  09/08/2023   FOOT EXAM  12/15/2023   Diabetic kidney evaluation - eGFR measurement  04/04/2024   OPHTHALMOLOGY EXAM  08/22/2024   Medicare Annual Wellness (AWV)  09/21/2024   DEXA SCAN  10/21/2024   Pneumococcal Vaccine: 50+ Years  Completed   Hepatitis C Screening  Completed   Zoster Vaccines- Shingrix  Completed   HPV VACCINES  Aged Out   Meningococcal B Vaccine  Aged Out   Colonoscopy  Discontinued   COVID-19 Vaccine  Discontinued    Health Maintenance  Health Maintenance Due  Topic Date Due   DTaP/Tdap/Td (2 - Tdap) 07/04/2013   Diabetic kidney evaluation - Urine ACR  11/24/2014   INFLUENZA VACCINE  08/20/2023   HEMOGLOBIN A1C  09/08/2023   Health Maintenance Items Addressed:  Labs Ordered: Hemoglobin A1C and Diabetic Kidney  Urine ACR  Additional Screening:  Vision Screening: Recommended annual ophthalmology exams for early detection of glaucoma and other disorders of the eye. Would you like a referral to an eye doctor? No    Dental Screening: Recommended annual dental exams for proper oral hygiene  Community Resource Referral / Chronic Care Management: CRR required this visit?  No   CCM required this visit?  No   Plan:    I have personally reviewed and noted the following in the patient's chart:   Medical and social history Use of alcohol, tobacco or illicit drugs  Current medications and supplements including opioid prescriptions. Patient is not currently taking opioid prescriptions. Functional ability and status Nutritional status Physical activity Advanced directives List of other physicians Hospitalizations, surgeries, and ER visits in previous 12 months Vitals Screenings to include cognitive, depression, and falls Referrals and appointments  In addition, I have reviewed and discussed with patient certain preventive protocols, quality metrics, and best practice recommendations. A written personalized care plan for preventive services as well as general preventive health recommendations were provided to patient.   Verdie CHRISTELLA Saba, CMA   09/22/2023   After Visit Summary: (In Person-Declined) Patient declined AVS at this time.  Notes: Scheduled a Diabetes f/u w/PCP for 09/27/2023

## 2023-09-22 NOTE — Patient Instructions (Signed)
 Angela Simmons , Thank you for taking time out of your busy schedule to complete your Annual Wellness Visit with me. I enjoyed our conversation and look forward to speaking with you again next year. I, as well as your care team,  appreciate your ongoing commitment to your health goals. Please review the following plan we discussed and let me know if I can assist you in the future. Your Game plan/ To Do List    Referrals: If you haven't heard from the office you've been referred to, please reach out to them at the phone provided.   Follow up Visits: We will see or speak with you next year for your Next Medicare AWV with our clinical staff Have you seen your provider in the last 6 months (3 months if uncontrolled diabetes)? Yes  Clinician Recommendations:  Aim for 30 minutes of exercise or brisk walking, 6-8 glasses of water , and 5 servings of fruits and vegetables each day. Educated and advised on getting the Tdap vaccine at the local pharmacy.      This is a list of the screenings recommended for you:  Health Maintenance  Topic Date Due   DTaP/Tdap/Td vaccine (2 - Tdap) 07/04/2013   Yearly kidney health urinalysis for diabetes  11/24/2014   Flu Shot  08/20/2023   Hemoglobin A1C  09/08/2023   Complete foot exam   12/15/2023   Yearly kidney function blood test for diabetes  04/04/2024   Eye exam for diabetics  08/22/2024   Medicare Annual Wellness Visit  09/21/2024   DEXA scan (bone density measurement)  10/21/2024   Pneumococcal Vaccine for age over 23  Completed   Hepatitis C Screening  Completed   Zoster (Shingles) Vaccine  Completed   HPV Vaccine  Aged Out   Meningitis B Vaccine  Aged Out   Colon Cancer Screening  Discontinued   COVID-19 Vaccine  Discontinued    Advanced directives: (Copy Requested) Please bring a copy of your health care power of attorney and living will to the office to be added to your chart at your convenience. You can mail to [facility] [address] [place], [place] [address] or email to [email or web address] Advance Care Planning is important because it:  [x]  Makes sure you receive the medical care that is consistent with your values, goals, and preferences  [x]  It provides guidance to your family and loved ones and reduces their decisional burden about whether or not they are making the right decisions based on your wishes.  Follow the link provided in your after visit summary or read over the paperwork we have mailed to you to help you started getting your Advance Directives in place. If you need assistance in completing these, please reach out to us  so that we can help you!

## 2023-09-26 ENCOUNTER — Encounter: Payer: Self-pay | Admitting: Internal Medicine

## 2023-09-26 DIAGNOSIS — M3501 Sicca syndrome with keratoconjunctivitis: Secondary | ICD-10-CM | POA: Insufficient documentation

## 2023-09-26 NOTE — Progress Notes (Unsigned)
 Subjective:    Patient ID: Angela Simmons, female    DOB: 05-Aug-1945, 78 y.o.   MRN: [id number]     HPI Angela Simmons is here for follow up of her chronic medical problems.  Right ear pain that radiated down to chest - lasted 3 days.  She also had pain in her right arm one of those day.     Sugars well controlled at home  Active.  No regular exercise.   Medications and allergies reviewed with patient and updated if appropriate.  Current Outpatient Medications on File Prior to Visit  Medication Sig Dispense Refill   ACCU-CHEK GUIDE TEST test strip USE 1 STRIP TO CHECK GLUCOSE ONCE DAILY 100 each 0   apixaban  (ELIQUIS ) 5 MG TABS tablet Take 1 tablet by mouth twice daily 60 tablet 5   atorvastatin  (LIPITOR) 10 MG tablet Take 1 tablet by mouth once daily 180 tablet 0   benazepril  (LOTENSIN ) 40 MG tablet Take 1 tablet by mouth once daily 90 tablet 0   Blood Glucose Monitoring Suppl (ONETOUCH VERIO) w/Device KIT USE TO TEST BLOOD SUGAR ONCE DAILY AS DIRECTED 1 kit 0   diclofenac  sodium (VOLTAREN ) 1 % GEL Apply 3 g to 3 large joints up to 3 times daily. (Patient taking differently: Apply 3 g topically 3 (three) times daily as needed (pain).) 3 Tube 3   diltiazem  (DILT-XR) 120 MG 24 hr capsule Take 1 capsule by mouth once daily 90 capsule 1   fluticasone  (FLONASE ) 50 MCG/ACT nasal spray Place 1 spray into both nostrils as needed for allergies or rhinitis.     hydrochlorothiazide  (HYDRODIURIL ) 12.5 MG tablet Take 1 tablet by mouth once daily 90 tablet 0   hydroxychloroquine  (PLAQUENIL ) 200 MG tablet Take 1 tablet by mouth once daily 30 tablet 0   loratadine  (CLARITIN ) 10 MG tablet TAKE 1 TABLET BY MOUTH EVERY DAY 30 tablet 2   Magnesium 250 MG TABS Take 250 mg by mouth daily.     metoprolol  tartrate (LOPRESSOR ) 25 MG tablet Take 1 tablet by mouth twice daily 180 tablet 3   Multiple Vitamins-Minerals (CENTRUM SILVER  50+WOMEN) TABS Take 1 tablet by mouth daily at 6 (six) AM.     Omega-3 Fatty  Acids (FISH OIL) 1000 MG CAPS Take 1,000 mg by mouth daily.     omeprazole  (PRILOSEC) 40 MG capsule Take 1 capsule by mouth once daily 90 capsule 0   ONETOUCH DELICA LANCETS 30G MISC USE TO TEST ONCE DAILY 100 each 2   ONETOUCH VERIO test strip USE TO TEST BLOOD SUGAR ONCE DAILY 100 each 0   Polyvinyl Alcohol-Povidone (REFRESH OP) Apply to eye 2 (two) times daily.     No current facility-administered medications on file prior to visit.     Review of Systems  Constitutional:  Negative for fever.  HENT:  Positive for rhinorrhea.   Respiratory:  Positive for cough (sometimes - coughs a couple of times when she lays down) and shortness of breath (occ). Negative for wheezing.   Cardiovascular:  Positive for leg swelling (left ankle - chronic  - ? related to knee OA). Negative for chest pain and palpitations.  Neurological:  Positive for light-headedness (occ) and headaches (frontal  - wakes up with it - resolves w/o treatment). Negative for dizziness.       Objective:   Vitals:   09/27/23 0920  BP: 138/80  Pulse: 74  Temp: 98 F (36.7 C)  SpO2: 98%   BP Readings  from Last 3 Encounters:  09/27/23 138/80  09/22/23 (!) 142/80  04/05/23 (!) 167/92   Wt Readings from Last 3 Encounters:  09/27/23 175 lb (79.4 kg)  09/22/23 175 lb 9.6 oz (79.7 kg)  04/05/23 182 lb (82.6 kg)   Body mass index is 25.84 kg/m.    Physical Exam Constitutional:      General: She is not in acute distress.    Appearance: Normal appearance.  HENT:     Head: Normocephalic and atraumatic.  Eyes:     Conjunctiva/sclera: Conjunctivae normal.  Cardiovascular:     Rate and Rhythm: Normal rate and regular rhythm.     Heart sounds: Normal heart sounds.  Pulmonary:     Effort: Pulmonary effort is normal. No respiratory distress.     Breath sounds: Normal breath sounds. No wheezing.  Musculoskeletal:     Cervical back: Neck supple.     Right lower leg: No edema.     Left lower leg: No edema.   Lymphadenopathy:     Cervical: No cervical adenopathy.  Skin:    General: Skin is warm and dry.     Findings: No rash.  Neurological:     Mental Status: She is alert. Mental status is at baseline.  Psychiatric:        Mood and Affect: Mood normal.        Behavior: Behavior normal.        Lab Results  Component Value Date   WBC 2.8 (L) 04/05/2023   HGB 12.6 04/05/2023   HCT 39.4 04/05/2023   PLT 209 04/05/2023   GLUCOSE 87 04/05/2023   GLUCOSE 87 04/05/2023   CHOL 127 03/11/2023   TRIG 50.0 03/11/2023   HDL 51.30 03/11/2023   LDLCALC 65 03/11/2023   ALT 11 04/05/2023   ALT 11 04/05/2023   AST 16 04/05/2023   AST 16 04/05/2023   NA 141 04/05/2023   NA 141 04/05/2023   K 4.0 04/05/2023   K 4.0 04/05/2023   CL 103 04/05/2023   CL 103 04/05/2023   CREATININE 1.03 (H) 04/05/2023   CREATININE 1.03 (H) 04/05/2023   BUN 14 04/05/2023   BUN 14 04/05/2023   CO2 29 04/05/2023   CO2 29 04/05/2023   TSH 2.44 04/08/2022   HGBA1C 6.2 03/11/2023   MICROALBUR 0.5 11/23/2013     Assessment & Plan:    See Problem List for Assessment and Plan of chronic medical problems.

## 2023-09-26 NOTE — Patient Instructions (Addendum)
      Blood work was ordered.       Medications changes include :   None    A referral was ordered and someone will call you to schedule an appointment.     Return in about 6 months (around 03/26/2024) for Physical Exam.

## 2023-09-27 ENCOUNTER — Ambulatory Visit (INDEPENDENT_AMBULATORY_CARE_PROVIDER_SITE_OTHER): Admitting: Internal Medicine

## 2023-09-27 VITALS — BP 138/80 | HR 74 | Temp 98.0°F | Ht 69.0 in | Wt 175.0 lb

## 2023-09-27 DIAGNOSIS — M3501 Sicca syndrome with keratoconjunctivitis: Secondary | ICD-10-CM | POA: Diagnosis not present

## 2023-09-27 DIAGNOSIS — E785 Hyperlipidemia, unspecified: Secondary | ICD-10-CM

## 2023-09-27 DIAGNOSIS — I10 Essential (primary) hypertension: Secondary | ICD-10-CM

## 2023-09-27 DIAGNOSIS — I480 Paroxysmal atrial fibrillation: Secondary | ICD-10-CM | POA: Diagnosis not present

## 2023-09-27 DIAGNOSIS — E1169 Type 2 diabetes mellitus with other specified complication: Secondary | ICD-10-CM

## 2023-09-27 DIAGNOSIS — E1140 Type 2 diabetes mellitus with diabetic neuropathy, unspecified: Secondary | ICD-10-CM

## 2023-09-27 DIAGNOSIS — K219 Gastro-esophageal reflux disease without esophagitis: Secondary | ICD-10-CM | POA: Diagnosis not present

## 2023-09-27 DIAGNOSIS — G6289 Other specified polyneuropathies: Secondary | ICD-10-CM | POA: Diagnosis not present

## 2023-09-27 LAB — COMPREHENSIVE METABOLIC PANEL WITH GFR
ALT: 13 U/L (ref 0–35)
AST: 18 U/L (ref 0–37)
Albumin: 3.9 g/dL (ref 3.5–5.2)
Alkaline Phosphatase: 55 U/L (ref 39–117)
BUN: 12 mg/dL (ref 6–23)
CO2: 29 meq/L (ref 19–32)
Calcium: 9.5 mg/dL (ref 8.4–10.5)
Chloride: 102 meq/L (ref 96–112)
Creatinine, Ser: 1.01 mg/dL (ref 0.40–1.20)
GFR: 53.76 mL/min — ABNORMAL LOW (ref >60.00)
Glucose, Bld: 89 mg/dL (ref 70–99)
Potassium: 3.8 meq/L (ref 3.5–5.1)
Sodium: 139 meq/L (ref 135–145)
Total Bilirubin: 0.6 mg/dL (ref 0.2–1.2)
Total Protein: 7.5 g/dL (ref 6.0–8.3)

## 2023-09-27 LAB — CBC WITH DIFFERENTIAL/PLATELET
Basophils Absolute: 0 K/uL (ref 0.0–0.1)
Basophils Relative: 0.5 % (ref 0.0–3.0)
Eosinophils Absolute: 0 K/uL (ref 0.0–0.7)
Eosinophils Relative: 0.5 % (ref 0.0–5.0)
HCT: 37.8 % (ref 36.0–46.0)
Hemoglobin: 12.5 g/dL (ref 12.0–15.0)
Lymphocytes Relative: 24 % (ref 12.0–46.0)
Lymphs Abs: 0.9 K/uL (ref 0.7–4.0)
MCHC: 33.1 g/dL (ref 30.0–36.0)
MCV: 91.3 fl (ref 78.0–100.0)
Monocytes Absolute: 0.3 K/uL (ref 0.1–1.0)
Monocytes Relative: 7.5 % (ref 3.0–12.0)
Neutro Abs: 2.4 K/uL (ref 1.4–7.7)
Neutrophils Relative %: 67.5 % (ref 43.0–77.0)
Platelets: 182 K/uL (ref 150.0–400.0)
RBC: 4.14 Mil/uL (ref 3.87–5.11)
RDW: 13.9 % (ref 11.5–15.5)
WBC: 3.6 K/uL — ABNORMAL LOW (ref 4.0–10.5)

## 2023-09-27 LAB — MICROALBUMIN / CREATININE URINE RATIO
Creatinine,U: 74.9 mg/dL
Microalb Creat Ratio: UNDETERMINED mg/g (ref 0.0–30.0)
Microalb, Ur: <0.7 mg/dL

## 2023-09-27 LAB — LIPID PANEL
Cholesterol: 132 mg/dL (ref 0–200)
HDL: 51.1 mg/dL (ref >39.00)
LDL Cholesterol: 72 mg/dL (ref 0–99)
NonHDL: 80.95
Total CHOL/HDL Ratio: 3
Triglycerides: 43 mg/dL (ref 0.0–149.0)
VLDL: 8.6 mg/dL (ref 0.0–40.0)

## 2023-09-27 LAB — HEMOGLOBIN A1C: Hgb A1c MFr Bld: 6.7 % — ABNORMAL HIGH (ref 4.6–6.5)

## 2023-09-27 NOTE — Assessment & Plan Note (Signed)
 Chronic Following with rheumatology On hydroxychloroquine  200 mg daily

## 2023-09-27 NOTE — Assessment & Plan Note (Signed)
 Chronic Following with cardiology Paroxysmal atrial fibrillation On metoprolol  25 mg twice daily, diltiazem  XR 120 mg daily and Eliquis  5 mg twice daily CBC, CMP

## 2023-09-27 NOTE — Assessment & Plan Note (Signed)
Chronic GERD typically controlled - has GERD occasionally Continue omeprazole 40 mg daily

## 2023-09-27 NOTE — Assessment & Plan Note (Signed)
Chronic Regular exercise and healthy diet encouraged Check lipid panel  Continue atorvastatin 10 mg daily 

## 2023-09-27 NOTE — Assessment & Plan Note (Signed)
 Chronic With peripheral neuropathy, hyperlipidemia  Lab Results  Component Value Date   HGBA1C 6.2 03/11/2023   Sugars well controlled She brought her readings in for me to review and sugars are well-controlled Check a1c, urine albumin/creatinine ratio Continue lifestyle control

## 2023-09-27 NOTE — Assessment & Plan Note (Signed)
 Chronic Due to Sjogren's and diabetes Not currently on any medication, other than hydroxychloroquine  for Sjogren's Caution with balance

## 2023-09-27 NOTE — Assessment & Plan Note (Signed)
 Chronic Blood pressure controlled Continue benazepril  40 mg daily, diltiazem  120 mg daily, metoprolol  25 mg twice daily, hydrochlorothiazide  12.5 mg daily CBC, CMP

## 2023-09-28 NOTE — Progress Notes (Signed)
 Office Visit Note  Patient: Angela Simmons             Date of Birth: Jul 27, 1945           MRN: [id number]             PCP: Geofm Glade PARAS, MD Referring: Geofm Glade PARAS, MD Visit Date: 10/12/2023 Occupation: @GUAROCC @  Subjective:  Pain in right shoulder  History of Present Illness: Angela Simmons is a 78 y.o. female with Sjogren's and osteoarthritis.  She returns today after her last visit in March 2025.  She states for the last 1 week she has been having some discomfort in her right shoulder joint specially when she sleeps on the right side.  She denies any injuries.  He continues to have dry mouth and dry eyes.  She has been taking hydroxychloroquine  200 mg p.o. daily without any interruption.  She continues to have some discomfort in her knees and her feet.  She denies any history of joint swelling.  None of the other joints are painful.    Activities of Daily Living:  Patient reports morning stiffness for 25 minutes.   Patient Reports nocturnal pain.  Difficulty dressing/grooming: Denies Difficulty climbing stairs: Reports Difficulty getting out of chair: Reports Difficulty using hands for taps, buttons, cutlery, and/or writing: Reports  Review of Systems  Constitutional:  Positive for fatigue.  HENT:  Negative for mouth sores and mouth dryness.   Eyes:  Positive for dryness.  Respiratory:  Positive for shortness of breath.   Cardiovascular:  Positive for palpitations. Negative for chest pain.  Gastrointestinal:  Negative for blood in stool, constipation and diarrhea.  Endocrine: Negative for increased urination.  Genitourinary:  Negative for involuntary urination.  Musculoskeletal:  Positive for joint pain, gait problem, joint pain, myalgias, muscle weakness, morning stiffness, muscle tenderness and myalgias. Negative for joint swelling.  Skin:  Positive for sensitivity to sunlight. Negative for color change, rash and hair loss.  Allergic/Immunologic: Negative for  susceptible to infections.  Neurological:  Positive for headaches. Negative for dizziness.  Hematological:  Positive for bruising/bleeding tendency. Negative for swollen glands.  Psychiatric/Behavioral:  Negative for depressed mood and sleep disturbance. The patient is not nervous/anxious.     PMFS History:  Patient Active Problem List   Diagnosis Date Noted   Sjogren's syndrome with keratoconjunctivitis sicca 09/26/2023   Vivid dream 04/08/2022   Peripheral neuropathy due to sjogren's disease 02/21/2022   Sensation of heaviness 01/05/2022   Mid back pain on left side 11/13/2021   Cervical radiculopathy due to degenerative joint disease of spine 02/13/2021   Right-sided chest pain 02/13/2021   Bilateral primary osteoarthritis of knee 02/13/2021   Right knee pain 01/03/2021   Radiculopathy 01/03/2021   Atrial flutter (HCC) 08/22/2020   Aortic atherosclerosis 06/28/2020   Secondary hypercoagulable state 12/26/2019   Anticoagulated 12/05/2019   PAF (paroxysmal atrial fibrillation) (HCC) 08/04/2019   Ankle swelling, left - chronic 04/03/2019   Hyperpigmented skin lesion 04/03/2019   Poor balance 12/06/2018   Headache 12/06/2018   Vertigo 06/07/2018   Urinary frequency 06/07/2018   Herpes zoster without complication 03/23/2018   Tingling in extremities 11/26/2017   Ischial bursitis of left side 06/02/2017   Bilateral leg edema 04/07/2016   Burning sensation of skin 04/07/2016   PMR (polymyalgia rheumatica) 03/27/2016   Osteopenia 03/04/2016   Autoimmune disease 03/18/2015   Inflammatory arthritis 03/18/2015   Joint pain 11/22/2014   Overweight (BMI 25.0-29.9) 08/18/2014   Low back  pain with radiation 08/18/2014   Hyperlipidemia associated with type 2 diabetes mellitus (HCC) 06/28/2014   Chest pain with moderate risk of acute coronary syndrome 04/11/2014   Tendonitis of shoulder 08/29/2013   S/P right mastectomy 11/14/2012   Type 2 diabetes, controlled, with neuropathy (HCC)  10/05/2012   Allergic rhinitis 01/26/2011   GERD 11/10/2009   INTERSTITIAL CYSTITIS 10/31/2008   BENIGN NEOPLASM OF VULVA 02/01/2008   Essential hypertension, benign 02/08/2007   History of cancer of right breast 02/08/2007    Past Medical History:  Diagnosis Date   Breast cancer (HCC) 1990s   right breast   Collagen vascular disease    Diabetes mellitus without complication (HCC)    GERD (gastroesophageal reflux disease)    HTN (hypertension)    Hypercholesterolemia    Obesity    Osteoarthritis     Family History  Problem Relation Age of Onset   Diabetes Mother        diabetic coma   Heart failure Mother    Diabetes Father    Emphysema Father    Diabetes Sister    Hypercholesterolemia Sister    Diabetes Sister    Hypertension Sister    Hypercholesterolemia Sister    Hypercholesterolemia Sister    Hypercholesterolemia Sister    Hypercholesterolemia Brother    Diabetes Brother    Hypertension Brother    Hypercholesterolemia Brother    Diabetes Brother    Hypertension Brother    Colon cancer Neg Hx    Past Surgical History:  Procedure Laterality Date   ABDOMINAL HYSTERECTOMY  1993   [place]   AORTIC ARCH ANGIOGRAPHY N/A 12/07/2019   Procedure: AORTIC ARCH ANGIOGRAPHY;  Surgeon: Court Dorn PARAS, MD;  Location: [facility];  Service: Cardiovascular;  Laterality: N/A;   COLONOSCOPY  May 2003   Dr. Claudene: normal   COLONOSCOPY  06/22/2011   Procedure: COLONOSCOPY;  Surgeon: Margo LITTIE Haddock, MD;  Location: [facility];  Service: Endoscopy;  Laterality: N/A;  10:30   COLONOSCOPY WITH PROPOFOL  N/A 05/13/2022   Procedure: COLONOSCOPY WITH PROPOFOL ;  Surgeon: Shaaron Lamar HERO, MD;  Location: [facility];  Service: Endoscopy;  Laterality: N/A;  1:15 PM   LEFT HEART CATH AND CORONARY ANGIOGRAPHY N/A 12/07/2019   Procedure: LEFT HEART CATH AND CORONARY ANGIOGRAPHY;  Surgeon: Court Dorn PARAS, MD;  Location: [facility];  Service: Cardiovascular;   Laterality: N/A;   MASTECTOMY  1995   right    POLYPECTOMY  05/13/2022   Procedure: POLYPECTOMY;  Surgeon: Shaaron Lamar HERO, MD;  Location: [facility];  Service: Endoscopy;;   Social History   Tobacco Use   Smoking status: Never    Passive exposure: Never   Smokeless tobacco: Never  Vaping Use   Vaping status: Never Used  Substance Use Topics   Alcohol use: No   Drug use: Never   Social History   Social History Narrative   Widowed   Caffeine, maybe 1 a day     Immunization History  Administered Date(s) Administered   Fluad Quad(high Dose 65+) 12/06/2018, 12/27/2019, 01/03/2021   Fluad Trivalent(High Dose 65+) 10/15/2022   INFLUENZA, HIGH DOSE SEASONAL PF 11/22/2014, 02/21/2016, 12/18/2016, 11/05/2017   Influenza Split 11/14/2010   Influenza Whole 10/18/2006, 10/31/2008, 11/06/2009   Influenza,inj,Quad PF,6+ Mos 11/14/2012, 11/23/2013   Moderna Sars-Covid-2 Vaccination 03/16/2019, 04/13/2019   Pneumococcal Conjugate-13 04/11/2014   Pneumococcal Polysaccharide-23 09/17/2011   Td 07/05/2003   Zoster Recombinant(Shingrix) 11/30/2017, 02/02/2018   Zoster, Live  06/15/2013     Objective: Vital Signs: BP (!) 164/75   Pulse (!) 54   Temp 97.8 F (36.6 C)   Resp 13   Ht 5' 9 (1.753 m)   Wt 176 lb 6.4 oz (80 kg)   BMI 26.05 kg/m    Physical Exam Vitals and nursing note reviewed.  Constitutional:      Appearance: She is well-developed.  HENT:     Head: Normocephalic and atraumatic.  Eyes:     Conjunctiva/sclera: Conjunctivae normal.  Cardiovascular:     Rate and Rhythm: Normal rate and regular rhythm.     Heart sounds: Normal heart sounds.  Pulmonary:     Effort: Pulmonary effort is normal.     Breath sounds: Normal breath sounds.  Abdominal:     General: Bowel sounds are normal.     Palpations: Abdomen is soft.  Musculoskeletal:     Cervical back: Normal range of motion.  Lymphadenopathy:     Cervical: No cervical adenopathy.  Skin:    General: Skin  is warm and dry.     Capillary Refill: Capillary refill takes less than 2 seconds.  Neurological:     Mental Status: She is alert and oriented to person, place, and time.  Psychiatric:        Behavior: Behavior normal.      Musculoskeletal Exam: She had limited lateral rotation of the cervical spine.  There was no tenderness of her thoracic or lumbar spine.  Shoulder joints with full range of motion with discomfort on range of motion of her right shoulder joint.  Elbows, wrist joints, MCPs were in good range of motion.  She had bilateral PIP and DIP thickening with no synovitis.  Hip joints and knee joints with good range of motion without any warmth swelling or effusion.  There was no tenderness over her ankles or MTPs.  CDAI Exam: CDAI Score: -- Patient Global: --; Provider Global: -- Swollen: --; Tender: -- Joint Exam 10/12/2023   No joint exam has been documented for this visit   There is currently no information documented on the homunculus. Go to the Rheumatology activity and complete the homunculus joint exam.  Investigation: No additional findings.  Imaging: No results found.  Recent Labs: Lab Results  Component Value Date   WBC 3.6 (L) 09/27/2023   HGB 12.5 09/27/2023   PLT 182.0 09/27/2023   NA 139 09/27/2023   K 3.8 09/27/2023   CL 102 09/27/2023   CO2 29 09/27/2023   GLUCOSE 89 09/27/2023   BUN 12 09/27/2023   CREATININE 1.01 09/27/2023   BILITOT 0.6 09/27/2023   ALKPHOS 55 09/27/2023   AST 18 09/27/2023   ALT 13 09/27/2023   PROT 7.5 09/27/2023   ALBUMIN 3.9 09/27/2023   CALCIUM  9.5 09/27/2023   GFRAA 72 04/23/2020   QFTBGOLDPLUS Negative 07/25/2019    Speciality Comments: PLQ Eye Exam: 08/23/2023 WNL @ [facility] Follow up 4 months  Procedures:  No procedures performed Allergies: Levofloxacin, Nitrofurantoin, Penicillins, Sulfonamide derivatives, and Pantoprazole    Assessment / Plan:     Visit Diagnoses: Sjogren's syndrome with  keratoconjunctivitis sicca (HCC) +ANA,+SSA,RF-: She continues to have dry mouth and dry eyes.  She is any shortness of breath or lymphadenopathy.  Over-the-counter products were discussed.  She continues to be on hydroxychloroquine  200 mg daily without any interruption.  April 05, 2023 IFE negative, ANA 1: 320 NS, SSA positive, SSB negative, RF negative.  Will recheck labs prior to the next  visit.  PMR (polymyalgia rheumatica) - PMR is in remission.  No muscular weakness or tenderness was noted.  High risk medication use - Plaquenil  200 mg 1 tablet by mouth daily. PLQ Eye Exam: August 23, 2023.  CBC and CMP were stable.  Acute pain of right shoulder-she has been having discomfort in her right shoulder.  The pain has been there only for 1 week.  She had good range of motion of her right shoulder joint.  I gave her a handout on shoulder exercises.  Primary osteoarthritis of both hands-she had bilateral PIP and DIP thickening.  Joint protection muscle strengthening was discussed.  Trochanteric bursitis of left hip-she continues to have some discomfort in her left trochanteric region.  A handout on IT band stretches was given. - She has intermittent pain.  Primary osteoarthritis of both knees - X-rays of the right knee 02/27/2021 and results were consistent with mild to moderate lateral compartment and moderate patellofemoral compartment osteoarthritis.  Primary osteoarthritis of both feet-perfecting shoes were advised.  DDD (degenerative disc disease), cervical - X-rays of the cervical spine were obtained on 02/13/2021: Cervical spondylosis noted.  Osteopenia of multiple sites - DEXA updated on 10/22/2022: Femoral neck T-score -1.8.  Right femoral neck T-score -0.9.  Lumbar spine +0.2.  Other medical problems listed as follows:  History of cancer of right breast  History of hyperlipidemia  S/P right mastectomy  History of hypertension  History of gastroesophageal reflux (GERD)  History of  peripheral neuropathy  Orders: Orders Placed This Encounter  Procedures   Protein / creatinine ratio, urine   CBC with Differential/Platelet   Comprehensive metabolic panel with GFR   Anti-DNA antibody, double-stranded   C3 and C4   Sedimentation rate   Sjogrens syndrome-A extractable nuclear antibody   Serum protein electrophoresis with reflex   Cryoglobulin   No orders of the defined types were placed in this encounter.   Follow-Up Instructions: Return in about 5 months (around 03/13/2024) for Osteoarthritis, Sjogren's.   Maya Nash, MD  Note - This record has been created using Animal nutritionist.  Chart creation errors have been sought, but may not always  have been located. Such creation errors do not reflect on  the standard of medical care.

## 2023-09-29 ENCOUNTER — Other Ambulatory Visit: Payer: Self-pay | Admitting: Physician Assistant

## 2023-09-30 ENCOUNTER — Ambulatory Visit: Payer: Self-pay | Admitting: Internal Medicine

## 2023-09-30 NOTE — Telephone Encounter (Signed)
 Last Fill: 08/16/2023  Eye exam: 08/23/2023 WNL    Labs: 09/27/2023 GFR 53.76 WBC 3.6 Rest of CMP and CBC WNL  Next Visit: 10/12/2023  Last Visit: 04/05/2023  IK:Dgnhmzw'd syndrome with keratoconjunctivitis sicca   Current Dose per office note 04/05/2023: Plaquenil  200 mg 1 tablet by mouth daily   Okay to refill Plaquenil ?

## 2023-10-11 ENCOUNTER — Other Ambulatory Visit: Payer: Self-pay | Admitting: Internal Medicine

## 2023-10-12 ENCOUNTER — Encounter: Payer: Self-pay | Admitting: Rheumatology

## 2023-10-12 ENCOUNTER — Ambulatory Visit: Attending: Rheumatology | Admitting: Rheumatology

## 2023-10-12 VITALS — BP 164/75 | HR 54 | Temp 97.8°F | Resp 13 | Ht 69.0 in | Wt 176.4 lb

## 2023-10-12 DIAGNOSIS — M5030 Other cervical disc degeneration, unspecified cervical region: Secondary | ICD-10-CM | POA: Diagnosis not present

## 2023-10-12 DIAGNOSIS — Z8639 Personal history of other endocrine, nutritional and metabolic disease: Secondary | ICD-10-CM | POA: Diagnosis not present

## 2023-10-12 DIAGNOSIS — Z8719 Personal history of other diseases of the digestive system: Secondary | ICD-10-CM

## 2023-10-12 DIAGNOSIS — M7702 Medial epicondylitis, left elbow: Secondary | ICD-10-CM | POA: Diagnosis not present

## 2023-10-12 DIAGNOSIS — M353 Polymyalgia rheumatica: Secondary | ICD-10-CM | POA: Diagnosis not present

## 2023-10-12 DIAGNOSIS — M19071 Primary osteoarthritis, right ankle and foot: Secondary | ICD-10-CM | POA: Diagnosis not present

## 2023-10-12 DIAGNOSIS — M7062 Trochanteric bursitis, left hip: Secondary | ICD-10-CM

## 2023-10-12 DIAGNOSIS — Z853 Personal history of malignant neoplasm of breast: Secondary | ICD-10-CM

## 2023-10-12 DIAGNOSIS — M25511 Pain in right shoulder: Secondary | ICD-10-CM

## 2023-10-12 DIAGNOSIS — M19041 Primary osteoarthritis, right hand: Secondary | ICD-10-CM | POA: Diagnosis not present

## 2023-10-12 DIAGNOSIS — M170 Bilateral primary osteoarthritis of knee: Secondary | ICD-10-CM | POA: Diagnosis not present

## 2023-10-12 DIAGNOSIS — Z8679 Personal history of other diseases of the circulatory system: Secondary | ICD-10-CM

## 2023-10-12 DIAGNOSIS — M8589 Other specified disorders of bone density and structure, multiple sites: Secondary | ICD-10-CM | POA: Diagnosis not present

## 2023-10-12 DIAGNOSIS — M19042 Primary osteoarthritis, left hand: Secondary | ICD-10-CM

## 2023-10-12 DIAGNOSIS — Z79899 Other long term (current) drug therapy: Secondary | ICD-10-CM | POA: Diagnosis not present

## 2023-10-12 DIAGNOSIS — M19072 Primary osteoarthritis, left ankle and foot: Secondary | ICD-10-CM

## 2023-10-12 DIAGNOSIS — M3501 Sicca syndrome with keratoconjunctivitis: Secondary | ICD-10-CM | POA: Diagnosis not present

## 2023-10-12 DIAGNOSIS — Z8669 Personal history of other diseases of the nervous system and sense organs: Secondary | ICD-10-CM

## 2023-10-12 DIAGNOSIS — Z9011 Acquired absence of right breast and nipple: Secondary | ICD-10-CM

## 2023-10-12 NOTE — Patient Instructions (Signed)
 Standing Labs We placed an order today for your standing lab work.   Please have your standing labs drawn in February  Please have your labs drawn 2 weeks prior to your appointment so that the provider can discuss your lab results at your appointment, if possible.  Please note that you may see your imaging and lab results in MyChart before we have reviewed them. We will contact you once all results are reviewed. Please allow our office up to 72 hours to thoroughly review all of the results before contacting the office for clarification of your results.  WALK-IN LAB HOURS  Monday through Thursday from 8:00 am -12:30 pm and 1:00 pm-4:30 pm and Friday from 8:00 am-12:00 pm.  Patients with office visits requiring labs will be seen before walk-in labs.  You may encounter longer than normal wait times. Please allow additional time. Wait times may be shorter on  Monday and Thursday afternoons.  We do not book appointments for walk-in labs. We appreciate your patience and understanding with our staff.   Labs are drawn by [facility]. Please bring your co-pay at the time of your lab draw.  You may receive a bill from [facility] for your lab work.  Please note if you are on Hydroxychloroquine  and and an order has been placed for a Hydroxychloroquine  level,  you will need to have it drawn 4 hours or more after your last dose.  If you wish to have your labs drawn at another location, please call the office 24 hours in advance so we can fax the orders.  The office is located at [address], [address], [place], [place] [address]   If you have any questions regarding directions or hours of operation,  please call [phone].   As a reminder, please drink plenty of water  prior to coming for your lab work. Thanks!  Shoulder Exercises Ask your health care provider which exercises are safe for you. Do exercises exactly as told by your health care provider and adjust them as directed. It is normal to feel mild  stretching, pulling, tightness, or discomfort as you do these exercises. Stop right away if you feel sudden pain or your pain gets worse. Do not begin these exercises until told by your health care provider. Stretching exercises External rotation and abduction This exercise is sometimes called corner stretch. The exercise rotates your arm outward (external rotation) and moves your arm out from your body (abduction). Stand in a doorway with one of your feet slightly in front of the other. This is called a staggered stance. If you cannot reach your forearms to the door frame, stand facing a corner of a room. Choose one of the following positions as told by your health care provider: Place your hands and forearms on the door frame above your head. Place your hands and forearms on the door frame at the height of your head. Place your hands on the door frame at the height of your elbows. Slowly move your weight onto your front foot until you feel a stretch across your chest and in the front of your shoulders. Keep your head and chest upright and keep your abdominal muscles tight. Hold for __________ seconds. To release the stretch, shift your weight to your back foot. Repeat __________ times. Complete this exercise __________ times a day. Extension, standing  Stand and hold a broomstick, a cane, or a similar object behind your back. Your hands should be a little wider than shoulder-width apart. Your palms should  face away from your back. Keeping your elbows straight and your shoulder muscles relaxed, move the stick away from your body until you feel a stretch in your shoulders (extension). Avoid shrugging your shoulders while you move the stick. Keep your shoulder blades tucked down toward the middle of your back. Hold for __________ seconds. Slowly return to the starting position. Repeat __________ times. Complete this exercise __________ times a day. Range-of-motion exercises Pendulum  Stand  near a wall or a surface that you can hold onto for balance. Bend at the waist and let your left / right arm hang straight down. Use your other arm to support you. Keep your back straight and do not lock your knees. Relax your left / right arm and shoulder muscles, and move your hips and your trunk so your left / right arm swings freely. Your arm should swing because of the motion of your body, not because you are using your arm or shoulder muscles. Keep moving your hips and trunk so your arm swings in the following directions, as told by your health care provider: Side to side. Forward and backward. In clockwise and counterclockwise circles. Continue each motion for __________ seconds, or for as long as told by your health care provider. Slowly return to the starting position. Repeat __________ times. Complete this exercise __________ times a day. Shoulder flexion, standing  Stand and hold a broomstick, a cane, or a similar object. Place your hands a little more than shoulder-width apart on the object. Your left / right hand should be palm-up, and your other hand should be palm-down. Keep your elbow straight and your shoulder muscles relaxed. Push the stick up with your healthy arm to raise your left / right arm in front of your body, and then over your head until you feel a stretch in your shoulder (flexion). Avoid shrugging your shoulder while you raise your arm. Keep your shoulder blade tucked down toward the middle of your back. Hold for __________ seconds. Slowly return to the starting position. Repeat __________ times. Complete this exercise __________ times a day. Shoulder abduction, standing  Stand and hold a broomstick, a cane, or a similar object. Place your hands a little more than shoulder-width apart on the object. Your left / right hand should be palm-up, and your other hand should be palm-down. Keep your elbow straight and your shoulder muscles relaxed. Push the object across  your body toward your left / right side. Raise your left / right arm to the side of your body (abduction) until you feel a stretch in your shoulder. Do not raise your arm above shoulder height unless your health care provider tells you to do that. If directed, raise your arm over your head. Avoid shrugging your shoulder while you raise your arm. Keep your shoulder blade tucked down toward the middle of your back. Hold for __________ seconds. Slowly return to the starting position. Repeat __________ times. Complete this exercise __________ times a day. Internal rotation  Place your left / right hand behind your back, palm-up. Use your other hand to dangle an exercise band, a broomstick, or a similar object over your shoulder. Grasp the band with your left / right hand so you are holding on to both ends. Gently pull up on the band until you feel a stretch in the front of your left / right shoulder. The movement of your arm toward the center of your body is called internal rotation. Avoid shrugging your shoulder while you raise your  arm. Keep your shoulder blade tucked down toward the middle of your back. Hold for __________ seconds. Release the stretch by letting go of the band and lowering your hands. Repeat __________ times. Complete this exercise __________ times a day. Strengthening exercises External rotation  Sit in a stable chair without armrests. Secure an exercise band to a stable object at elbow height on your left / right side. Place a soft object, such as a folded towel or a small pillow, between your left / right upper arm and your body to move your elbow about 4 inches (10 cm) away from your side. Hold the end of the exercise band so it is tight and there is no slack. Keeping your elbow pressed against the soft object, slowly move your forearm out, away from your abdomen (external rotation). Keep your body steady so only your forearm moves. Hold for __________ seconds. Slowly  return to the starting position. Repeat __________ times. Complete this exercise __________ times a day. Shoulder abduction  Sit in a stable chair without armrests, or stand up. Hold a __________ lb / kg weight in your left / right hand, or hold an exercise band with both hands. Start with your arms straight down and your left / right palm facing in, toward your body. Slowly lift your left / right hand out to your side (abduction). Do not lift your hand above shoulder height unless your health care provider tells you that this is safe. Keep your arms straight. Avoid shrugging your shoulder while you do this movement. Keep your shoulder blade tucked down toward the middle of your back. Hold for __________ seconds. Slowly lower your arm, and return to the starting position. Repeat __________ times. Complete this exercise __________ times a day. Shoulder extension  Sit in a stable chair without armrests, or stand up. Secure an exercise band to a stable object in front of you so it is at shoulder height. Hold one end of the exercise band in each hand. Straighten your elbows and lift your hands up to shoulder height. Squeeze your shoulder blades together as you pull your hands down to the sides of your thighs (extension). Stop when your hands are straight down by your sides. Do not let your hands go behind your body. Hold for __________ seconds. Slowly return to the starting position. Repeat __________ times. Complete this exercise __________ times a day. Shoulder row  Sit in a stable chair without armrests, or stand up. Secure an exercise band to a stable object in front of you so it is at chest height. Hold one end of the exercise band in each hand. Position your palms so that your thumbs are facing the ceiling (neutral position). Bend each of your elbows to a 90-degree angle (right angle) and keep your upper arms at your sides. Step back or move the chair back until the band is tight and  there is no slack. Slowly pull your elbows back behind you. Hold for __________ seconds. Slowly return to the starting position. Repeat __________ times. Complete this exercise __________ times a day. Shoulder press-ups  Sit in a stable chair that has armrests. Sit upright, with your feet flat on the floor. Put your hands on the armrests so your elbows are bent and your fingers are pointing forward. Your hands should be about even with the sides of your body. Push down on the armrests and use your arms to lift yourself off the chair. Straighten your elbows and lift yourself up as  much as you comfortably can. Move your shoulder blades down, and avoid letting your shoulders move up toward your ears. Keep your feet on the ground. As you get stronger, your feet should support less of your body weight as you lift yourself up. Hold for __________ seconds. Slowly lower yourself back into the chair. Repeat __________ times. Complete this exercise __________ times a day. Wall push-ups  Stand so you are facing a stable wall. Your feet should be about one arm-length away from the wall. Lean forward and place your palms on the wall at shoulder height. Keep your feet flat on the floor as you bend your elbows and lean forward toward the wall. Hold for __________ seconds. Straighten your elbows to push yourself back to the starting position. Repeat __________ times. Complete this exercise __________ times a day. This information is not intended to replace advice given to you by your health care provider. Make sure you discuss any questions you have with your health care provider. Document Revised: 02/25/2021 Document Reviewed: 02/25/2021 [employer] Patient Education  2024 [employer]  Iliotibial Band Syndrome Rehab Ask your health care provider which exercises are safe for you. Do exercises exactly as told by your provider and adjust them as told. It's normal to feel mild stretching, pulling,  tightness, or discomfort as you do these exercises. Stop right away if you feel sudden pain or your pain gets a lot worse. Do not begin these exercises until told by your provider. Stretching and range-of-motion exercises These exercises warm up your muscles and joints. They also improve the movement and flexibility of your hip and pelvis. Quadriceps stretch, prone  Lie face down (prone) on a firm surface like a bed or padded floor. Bend your left / right knee. Reach back to hold your ankle or pant leg. If you can't reach your ankle or pant leg, use a belt looped around your foot and grab the belt instead. Gently pull your heel toward your butt. Your knee should not slide out to the side. You should feel a stretch in the front of your thigh and knee, also called the quadriceps. Hold this position for __________ seconds. Repeat __________ times. Complete this exercise __________ times a day. Iliotibial band stretch The iliotibial band is a strip of tissue that runs along the outside of your hip down to your knee. Lie on your side with your left / right leg on top. Bend both knees and grab your left / right ankle. Stretch out your bottom arm to help you balance. Slowly bring your top knee back so your thigh goes behind your back. Slowly lower your top leg toward the floor until you feel a gentle stretch on the outside of your left / right hip and thigh. If you don't feel a stretch and your knee won't go farther, place the heel of your other foot on top of your knee and pull your knee down toward the floor with your foot. Hold this position for __________ seconds. Repeat __________ times. Complete this exercise __________ times a day. Strengthening exercises These exercises build strength and endurance in your hip and pelvis. Endurance means your muscles can keep working even when they're tired. Straight leg raises, side-lying This exercise strengthens the muscles that rotate the leg at the hip and  move it away from your body. These muscles are called hip abductors. Lie on your side with your left / right leg on top. Lie so your head, shoulder, hip, and knee line up.  You can bend your bottom knee to help you balance. Roll your hips slightly forward so they're stacked directly over each other. Your left / right knee should face forward. Tense the muscles in your outer thigh and hip. Lift your top leg 4-6 inches (10-15 cm) off the ground. Hold this position for __________ seconds. Slowly lower your leg back down to the starting position. Let your muscles fully relax before doing this exercise again. Repeat __________ times. Complete this exercise __________ times a day. Leg raises, prone This exercise strengthens the muscles that move the hips backward. These muscles are called hip extensors. Lie face down (prone) on your bed or a firm surface. You can put a pillow under your hips for comfort and to support your lower back. Bend your left / right knee so your foot points straight up toward the ceiling. Keep the other leg straight and behind you. Squeeze your butt muscles. Lift your left / right thigh off the firm surface. Do not let your back arch. Tense your thigh muscle as hard as you can without having more knee pain. Hold this position for __________ seconds. Slowly lower your leg to the starting position. Allow your leg to relax all the way. Repeat __________ times. Complete this exercise __________ times a day. Hip hike  Stand sideways on a bottom step. Place your feet so that your left / right leg is on the step, and the other foot is hanging off the side. If you need support for balance, hold onto a railing or wall. Keep your knees straight and your abdomen square, meaning your hips are level. Then, lift your left / right hip up toward the ceiling. Slowly let your leg that's hanging off the step lower towards the floor. Your foot should get closer to the ground. Do not lean or bend  your knees during this movement. Repeat __________ times. Complete this exercise __________ times a day. This information is not intended to replace advice given to you by your health care provider. Make sure you discuss any questions you have with your health care provider. Document Revised: 03/20/2022 Document Reviewed: 03/20/2022 [employer] Patient Education  2024 [employer].

## 2023-11-01 ENCOUNTER — Other Ambulatory Visit: Payer: Self-pay | Admitting: Internal Medicine

## 2023-11-09 ENCOUNTER — Ambulatory Visit: Attending: Cardiology | Admitting: Cardiology

## 2023-11-09 VITALS — BP 150/84 | HR 59 | Ht 69.5 in | Wt 174.6 lb

## 2023-11-09 DIAGNOSIS — D6869 Other thrombophilia: Secondary | ICD-10-CM

## 2023-11-09 DIAGNOSIS — E782 Mixed hyperlipidemia: Secondary | ICD-10-CM

## 2023-11-09 DIAGNOSIS — I4892 Unspecified atrial flutter: Secondary | ICD-10-CM

## 2023-11-09 DIAGNOSIS — I10 Essential (primary) hypertension: Secondary | ICD-10-CM | POA: Diagnosis not present

## 2023-11-09 NOTE — Progress Notes (Signed)
 Clinical Summary Ms. Dewberry is a 78 y.o.female seen today for follow up of the following medical problems.      1. Aflutter/ Atach - noted during 07/2019 admission. Started on lopressor  and eliquis  at that time.     -higher doses of dilt caused dizziness, fatigue.    06/2020 monitor showed infrequent short runs of SVT.  -seen in ER 02/16/22 with aflutter with RVR - converted to SR with IV diltiazem  in ER, discharged from ER   - rare palpitations - compliant with meds, no bleeding on eliquis  - EKG today show NSR   2. SOB 03/2021 echo: LVEF 55-60%, no WMAs, indet diastolic. Normal RV, PASP 44 - no recent SOB   3. History of chest pain - cardiac catheterization in 11/2019 showed a normal left system as outlined above but the RCA was unable to be engaged. Medical therapy was recommended unless she had refractory symptoms  - no recent chest pains.      4. HTN - her home bps 120s-130s/60s-70s - has had some white coat HTN     5. Hyperlipdiemia - she is on atorvastatin  10mg    06/2021 TC 126 TG 39 HDL 52 LDL 66 12/2021 TC 138 TG 43 HDL 51 LDL 78 03/2022 TC 140 TG 44 HDL 52 LDL 78 02/2023 TC 127 TG 50 HDL 51 LDL 65  09/2023 TC 132 TG 43 HDL 41 LDL 72   Past Medical History:  Diagnosis Date   Breast cancer (HCC) 1990s   right breast   Collagen vascular disease    Diabetes mellitus without complication (HCC)    GERD (gastroesophageal reflux disease)    HTN (hypertension)    Hypercholesterolemia    Obesity    Osteoarthritis      Allergies  Allergen Reactions   Levofloxacin Hives and Itching   Nitrofurantoin Hives and Itching   Penicillins Hives and Itching   Sulfonamide Derivatives Itching   Pantoprazole  Other (See Comments)    Bad dreams     Current Outpatient Medications  Medication Sig Dispense Refill   ACCU-CHEK GUIDE TEST test strip USE 1 STRIP TO CHECK GLUCOSE ONCE DAILY 100 each 0   apixaban  (ELIQUIS ) 5 MG TABS tablet Take 1 tablet by mouth twice  daily 60 tablet 5   atorvastatin  (LIPITOR) 10 MG tablet Take 1 tablet by mouth once daily 180 tablet 0   benazepril  (LOTENSIN ) 40 MG tablet Take 1 tablet by mouth once daily 90 tablet 0   Blood Glucose Monitoring Suppl (ONETOUCH VERIO) w/Device KIT USE TO TEST BLOOD SUGAR ONCE DAILY AS DIRECTED 1 kit 0   diclofenac  sodium (VOLTAREN ) 1 % GEL Apply 3 g to 3 large joints up to 3 times daily. (Patient taking differently: Apply 3 g topically 3 (three) times daily as needed (pain).) 3 Tube 3   diltiazem  (DILT-XR) 120 MG 24 hr capsule Take 1 capsule by mouth once daily 90 capsule 1   fluticasone  (FLONASE ) 50 MCG/ACT nasal spray Place 1 spray into both nostrils as needed for allergies or rhinitis.     hydrochlorothiazide  (HYDRODIURIL ) 12.5 MG tablet Take 1 tablet by mouth once daily 90 tablet 0   hydroxychloroquine  (PLAQUENIL ) 200 MG tablet Take 1 tablet by mouth once daily 90 tablet 0   loratadine  (CLARITIN ) 10 MG tablet TAKE 1 TABLET BY MOUTH EVERY DAY (Patient taking differently: Take 10 mg by mouth daily as needed.) 30 tablet 2   Magnesium 250 MG TABS Take 250 mg by mouth  daily.     metoprolol  tartrate (LOPRESSOR ) 25 MG tablet Take 1 tablet by mouth twice daily 180 tablet 3   Multiple Vitamins-Minerals (CENTRUM SILVER  50+WOMEN) TABS Take 1 tablet by mouth daily at 6 (six) AM.     Omega-3 Fatty Acids (FISH OIL) 1000 MG CAPS Take 1,000 mg by mouth daily.     omeprazole  (PRILOSEC) 40 MG capsule Take 1 capsule by mouth once daily 90 capsule 0   ONETOUCH DELICA LANCETS 30G MISC USE TO TEST ONCE DAILY 100 each 2   ONETOUCH VERIO test strip USE TO TEST BLOOD SUGAR ONCE DAILY 100 each 0   Polyvinyl Alcohol-Povidone (REFRESH OP) Apply to eye 2 (two) times daily.     No current facility-administered medications for this visit.     Past Surgical History:  Procedure Laterality Date   ABDOMINAL HYSTERECTOMY  1993   [place]   AORTIC ARCH ANGIOGRAPHY N/A 12/07/2019   Procedure: AORTIC ARCH  ANGIOGRAPHY;  Surgeon: Court Dorn PARAS, MD;  Location: [facility];  Service: Cardiovascular;  Laterality: N/A;   COLONOSCOPY  May 2003   Dr. Claudene: normal   COLONOSCOPY  06/22/2011   Procedure: COLONOSCOPY;  Surgeon: Margo LITTIE Haddock, MD;  Location: [facility];  Service: Endoscopy;  Laterality: N/A;  10:30   COLONOSCOPY WITH PROPOFOL  N/A 05/13/2022   Procedure: COLONOSCOPY WITH PROPOFOL ;  Surgeon: Shaaron Lamar HERO, MD;  Location: [facility];  Service: Endoscopy;  Laterality: N/A;  1:15 PM   LEFT HEART CATH AND CORONARY ANGIOGRAPHY N/A 12/07/2019   Procedure: LEFT HEART CATH AND CORONARY ANGIOGRAPHY;  Surgeon: Court Dorn PARAS, MD;  Location: [facility];  Service: Cardiovascular;  Laterality: N/A;   MASTECTOMY  1995   right    POLYPECTOMY  05/13/2022   Procedure: POLYPECTOMY;  Surgeon: Shaaron Lamar HERO, MD;  Location: [facility];  Service: Endoscopy;;     Allergies  Allergen Reactions   Levofloxacin Hives and Itching   Nitrofurantoin Hives and Itching   Penicillins Hives and Itching   Sulfonamide Derivatives Itching   Pantoprazole  Other (See Comments)    Bad dreams      Family History  Problem Relation Age of Onset   Diabetes Mother        diabetic coma   Heart failure Mother    Diabetes Father    Emphysema Father    Diabetes Sister    Hypercholesterolemia Sister    Diabetes Sister    Hypertension Sister    Hypercholesterolemia Sister    Hypercholesterolemia Sister    Hypercholesterolemia Sister    Hypercholesterolemia Brother    Diabetes Brother    Hypertension Brother    Hypercholesterolemia Brother    Diabetes Brother    Hypertension Brother    Colon cancer Neg Hx      Social History Ms. Terpstra reports that she has never smoked. She has never been exposed to tobacco smoke. She has never used smokeless tobacco. Ms. Goller reports no history of alcohol use.    Physical Examination Today's Vitals   11/09/23 1000 11/09/23 1007  BP:  (!) 168/94 (!) 162/92  Pulse: (!) 59   SpO2: 97%   Weight: 174 lb 9.6 oz (79.2 kg)   Height: 5' 9.5 (1.765 m)    Body mass index is 25.41 kg/m.  Gen: resting comfortably, no acute distress HEENT: no scleral icterus, pupils equal round and reactive, no palptable cervical adenopathy,  CV: RRR, no mrg, no jvd Resp: Clear to  auscultation bilaterally GI: abdomen is soft, non-tender, non-distended, normal bowel sounds, no hepatosplenomegaly MSK: extremities are warm, no edema.  Skin: warm, no rash Neuro:  no focal deficits Psych: appropriate affect   Diagnostic Studies 05/2019 nuclear stress No diagnostic ST segment changes to indicate ischemia. No significant myocardial perfusion defects to indicate scar or ischemia. There is apical breast attenuation artifact This is a low risk study. Nuclear stress EF: 72%.   04/2019 echo IMPRESSIONS     1. Left ventricular ejection fraction, by estimation, is 60 to 65%. The  left ventricle has normal function. The left ventricle has no regional  wall motion abnormalities. There is mild left ventricular hypertrophy.  Left ventricular diastolic parameters  were normal.   2. Right ventricular systolic function is normal. The right ventricular  size is normal. There is mildly elevated pulmonary artery systolic  pressure.   3. The mitral valve is normal in structure. Trivial mitral valve  regurgitation. No evidence of mitral stenosis.   4. The aortic valve is tricuspid. Aortic valve regurgitation is not  visualized. No aortic stenosis is present.   5. The inferior vena cava is normal in size with greater than 50%  respiratory variability, suggesting right atrial pressure of 3 mmHg.   11/2019 cath IMPRESSION: Ms. Appleyard has normal left coronary system.  She has an apparent takeoff RCA from close to the left main takeoff.  I was unable to selectively intubate this from the right radial approach.  I did use 180 cc of contrast and I aborted the  procedure.  She will need to return for RCA angiography via the right femoral approach.  The sheath was removed and a TR band was placed on the right wrist to achieve patent hemostasis.  The patient left lab in stable condition.  She will be discharged home today as an outpatient.   06/2020 event monitor Rare supraventricular ectopy. Rare short runs of SVT up to 12 beats. Rare ventricular ectopy Reported episode of fainting correlated with sinus rhythm at 60 bpm     Patch Wear Time:  13 days and 16 hours (2022-05-18T11:46:34-0400 to 2022-06-01T04:15:38-0400)   Patient had a min HR of 41 bpm, max HR of 174 bpm, and avg HR of 55 bpm. Predominant underlying rhythm was Sinus Rhythm. 27 Supraventricular Tachycardia runs occurred, the run with the fastest interval lasting 5 beats with a max rate of 174 bpm, the longest lasting 12 beats with an avg rate of 113 bpm. Isolated SVEs were rare (<1.0%), SVE Couplets were rare (<1.0%), and SVE Triplets were rare (<1.0%). Isolated VEs were rare (<1.0%), VE Couplets were rare (<1.0%), and no VE Triplets were present. Ventricular Bigeminy was present.   03/2021 echo IMPRESSIONS     1. Left ventricular ejection fraction, by estimation, is 55 to 60%. The  left ventricle has normal function. The left ventricle has no regional  wall motion abnormalities. There is mild asymmetric left ventricular  hypertrophy of the basal segment. Left  ventricular diastolic parameters are indeterminate.   2. Right ventricular systolic function is normal. The right ventricular  size is normal. There is mildly elevated pulmonary artery systolic  pressure. The estimated right ventricular systolic pressure is 44.0 mmHg.   3. The mitral valve is grossly normal. Mild mitral valve regurgitation.   4. The aortic valve is tricuspid. Aortic valve regurgitation is mild.  Aortic valve sclerosis is present, with no evidence of aortic valve  stenosis.   5. The inferior vena cava is  normal in size with greater than 50%  respiratory variability, suggesting right atrial pressure of 3 mmHg.   Comparison(s): Prior images reviewed side by side. LVEF remains normal  range at 55-60%. Estimated RVSP mildly elevated.     Assessment and Plan   1. Aflutter/atach/acquired thrombophilia -doing well without significant symptoms, continue current therapy including eliquis  for stroke prevention - EKG shows SR at 59 today   2. HTN - typically higher in clinic and at goal at home - her daily home numbers are at goal, continue current meds   3. Hyperipidemia -lipids at goal, continue current meds  F/u 6 months     Dorn PHEBE Ross, M.D.

## 2023-11-09 NOTE — Patient Instructions (Signed)
 Medication Instructions:  Continue all current medications.   Labwork: none  Testing/Procedures: none  Follow-Up: 6 months   Any Other Special Instructions Will Be Listed Below (If Applicable).   If you need a refill on your cardiac medications before your next appointment, please call your pharmacy.

## 2023-11-10 ENCOUNTER — Other Ambulatory Visit: Payer: Self-pay | Admitting: Internal Medicine

## 2023-11-17 ENCOUNTER — Telehealth: Payer: Self-pay

## 2023-11-17 NOTE — Telephone Encounter (Signed)
 Copied from CRM [id number]. Topic: General - Other >> Nov 17, 2023  9:06 AM Revonda D wrote: Reason for CRM: Pt is requesting a callback today from the nurse in regards to a personal question.

## 2023-11-18 NOTE — Telephone Encounter (Signed)
 Spoke with patient today.

## 2023-11-22 ENCOUNTER — Ambulatory Visit (INDEPENDENT_AMBULATORY_CARE_PROVIDER_SITE_OTHER): Admitting: Internal Medicine

## 2023-11-22 ENCOUNTER — Encounter: Payer: Self-pay | Admitting: Internal Medicine

## 2023-11-22 VITALS — BP 126/80 | HR 62 | Temp 98.0°F | Ht 69.5 in | Wt 174.0 lb

## 2023-11-22 DIAGNOSIS — I10 Essential (primary) hypertension: Secondary | ICD-10-CM

## 2023-11-22 DIAGNOSIS — L089 Local infection of the skin and subcutaneous tissue, unspecified: Secondary | ICD-10-CM | POA: Diagnosis not present

## 2023-11-22 DIAGNOSIS — L729 Follicular cyst of the skin and subcutaneous tissue, unspecified: Secondary | ICD-10-CM

## 2023-11-22 MED ORDER — CEPHALEXIN 500 MG PO CAPS: 500.0000 mg | ORAL_CAPSULE | Freq: Three times a day (TID) | ORAL | 0 refills | Status: AC

## 2023-11-22 NOTE — Assessment & Plan Note (Signed)
 Chronic Blood pressure initially elevated, but controlled when rechecked and has been controlled at home No change in medication Continue benazepril  40 mg daily, diltiazem  120 mg daily, metoprolol  25 mg twice daily, hydrochlorothiazide  12.5 mg daily Continue to monitor blood pressure at home

## 2023-11-22 NOTE — Assessment & Plan Note (Signed)
 Acute Infected cyst under left breast She did apply warm compresses and there was some drainage and it has improved, but she still has residual infection integration Continue warm compresses Start Keflex 500 mg 3 times daily x 7 days Advised to call or return if symptoms do not resolve completely

## 2023-11-22 NOTE — Patient Instructions (Addendum)
    Use warm compresses on the wound.       Medications changes include :   keflex three times a day for one week       Return if symptoms worsen or fail to improve.

## 2023-11-22 NOTE — Progress Notes (Signed)
 Subjective:    Patient ID: Angela Simmons, female    DOB: 1945-04-19, 78 y.o.   MRN: [id number]      HPI Angela Simmons is here for  Chief Complaint  Patient presents with   Left breast pain   Discussed the use of AI scribe software for clinical note transcription with the patient, who gave verbal consent to proceed.  History of Present Illness Angela Simmons is a 78 year old female who presents with a tender and draining lump under her breast.  She noticed a tender and sore lump under her left breast around November 16, 2023. The lump, described as a 'great big old knot', was initially very sore, causing her whole chest to feel sore. Warm compresses were applied, leading to drainage of a blackhead and yellowish pus over three days. The lump is now almost healed, with two small holes remaining, but it is still somewhat tender.  During the time of the drainage, she experienced chills and fever, along with stomach pain and soreness. The stomach pain was severe, described as cramping, and was accompanied by diarrhea for a couple of days. The diarrhea resolved by the previous week, around November 22, 2023. She also experienced a lot of gas and heartburn, particularly when lying down. No nausea is reported, but there is some improvement in her stomach symptoms.  She has a history of elevated blood pressure, which she monitors at home. Her blood pressure readings have been better at home, with recent readings around 129/80 mmHg. She did not take her blood pressure medication this morning due to leaving her water  in the car. Her blood pressure tends to be elevated when she is active but improves when she is calm and seated.        Medications and allergies reviewed with patient and updated if appropriate.  Current Outpatient Medications on File Prior to Visit  Medication Sig Dispense Refill   ACCU-CHEK GUIDE TEST test strip USE 1 STRIP TO CHECK GLUCOSE ONCE DAILY 100 each 0   apixaban   (ELIQUIS ) 5 MG TABS tablet Take 1 tablet by mouth twice daily 60 tablet 5   atorvastatin  (LIPITOR) 10 MG tablet Take 1 tablet by mouth once daily 180 tablet 0   benazepril  (LOTENSIN ) 40 MG tablet Take 1 tablet by mouth once daily 90 tablet 0   Blood Glucose Monitoring Suppl (ONETOUCH VERIO) w/Device KIT USE TO TEST BLOOD SUGAR ONCE DAILY AS DIRECTED 1 kit 0   diclofenac  sodium (VOLTAREN ) 1 % GEL Apply 3 g to 3 large joints up to 3 times daily. (Patient taking differently: Apply 3 g topically 3 (three) times daily as needed (pain).) 3 Tube 3   diltiazem  (DILT-XR) 120 MG 24 hr capsule Take 1 capsule by mouth once daily 90 capsule 1   fluticasone  (FLONASE ) 50 MCG/ACT nasal spray Place 1 spray into both nostrils as needed for allergies or rhinitis.     hydrochlorothiazide  (HYDRODIURIL ) 12.5 MG tablet Take 1 tablet by mouth once daily 90 tablet 0   hydroxychloroquine  (PLAQUENIL ) 200 MG tablet Take 1 tablet by mouth once daily 90 tablet 0   loratadine  (CLARITIN ) 10 MG tablet TAKE 1 TABLET BY MOUTH EVERY DAY (Patient taking differently: Take 10 mg by mouth daily as needed for allergies.) 30 tablet 2   Magnesium 250 MG TABS Take 250 mg by mouth daily.     metoprolol  tartrate (LOPRESSOR ) 25 MG tablet Take 1 tablet by mouth twice daily 180 tablet 3  Multiple Vitamins-Minerals (CENTRUM SILVER  50+WOMEN) TABS Take 1 tablet by mouth daily at 6 (six) AM.     Omega-3 Fatty Acids (FISH OIL) 1000 MG CAPS Take 1,000 mg by mouth daily.     omeprazole  (PRILOSEC) 40 MG capsule Take 1 capsule by mouth once daily 90 capsule 0   ONETOUCH DELICA LANCETS 30G MISC USE TO TEST ONCE DAILY 100 each 2   ONETOUCH VERIO test strip USE TO TEST BLOOD SUGAR ONCE DAILY 100 each 0   Polyvinyl Alcohol-Povidone (REFRESH OP) Apply to eye 2 (two) times daily.     No current facility-administered medications on file prior to visit.    Review of Systems  Constitutional:  Positive for chills and fever.  Gastrointestinal:  Positive for  abdominal pain and diarrhea (a couple of day). Negative for constipation and nausea.       Some gerd, lots of gas  Genitourinary:  Negative for dysuria, frequency, hematuria and urgency.  Skin:  Positive for wound.       Objective:   Vitals:   11/22/23 1033  BP: (!) 140/92  Pulse: 62  Temp: 98 F (36.7 C)  SpO2: 98%   BP Readings from Last 3 Encounters:  11/22/23 (!) 140/92  11/09/23 (!) 150/84  10/12/23 (!) 164/75   Wt Readings from Last 3 Encounters:  11/22/23 174 lb (78.9 kg)  11/09/23 174 lb 9.6 oz (79.2 kg)  10/12/23 176 lb 6.4 oz (80 kg)   Body mass index is 25.33 kg/m.    Physical Exam Constitutional:      General: She is not in acute distress.    Appearance: Normal appearance. She is not ill-appearing.  HENT:     Head: Normocephalic and atraumatic.  Skin:    General: Skin is warm and dry.     Comments: Cyst under left breast with the area of induration approximately 3 cm in length and 1-1/2 cm with with central opening it does have a small amount of bloody discharge.  With pressure I was able to extract a small amount of blood and.  No surrounding erythema  Neurological:     Mental Status: She is alert.            Assessment & Plan:    See Problem List for Assessment and Plan of chronic medical problems.

## 2023-12-06 ENCOUNTER — Other Ambulatory Visit: Payer: Self-pay | Admitting: Internal Medicine

## 2023-12-20 ENCOUNTER — Ambulatory Visit: Payer: Self-pay

## 2023-12-20 ENCOUNTER — Encounter: Payer: Self-pay | Admitting: Family Medicine

## 2023-12-20 ENCOUNTER — Encounter: Payer: Self-pay | Admitting: Internal Medicine

## 2023-12-20 ENCOUNTER — Ambulatory Visit: Admitting: Family Medicine

## 2023-12-20 VITALS — BP 142/82 | HR 63 | Temp 98.6°F | Resp 18 | Ht 69.5 in | Wt 176.0 lb

## 2023-12-20 DIAGNOSIS — J069 Acute upper respiratory infection, unspecified: Secondary | ICD-10-CM | POA: Diagnosis not present

## 2023-12-20 MED ORDER — PROMETHAZINE-DM 6.25-15 MG/5ML PO SYRP: 5.0000 mL | ORAL_SOLUTION | Freq: Four times a day (QID) | ORAL | 0 refills | Status: AC | PRN

## 2023-12-20 NOTE — Telephone Encounter (Signed)
 Specialist reports unable to transfer call, technical difficulties. Nurse will call patient.

## 2023-12-20 NOTE — Progress Notes (Signed)
 Assessment & Plan Viral URI Symptoms suggest viral etiology with slight improvement.  - Advised to monitor symptoms and report worsening via MyChart. Orders:   promethazine -dextromethorphan (PROMETHAZINE -DM) 6.25-15 MG/5ML syrup; Take 5 mLs by mouth 4 (four) times daily as needed for cough.   Follow up plan: Return if symptoms worsen or fail to improve.  Niki Rung, MSN, APRN, FNP-C  Subjective:  HPI: Angela Simmons is a 78 y.o. female presenting on 12/20/2023 for Cough (Weak, cough, congestion (clear to green), no energy, decreased appetite, poss fever last week, HA and ST at first /Started last Tuesday (6 days) /2 home covid test - neg )  Discussed the use of AI scribe software for clinical note transcription with the patient, who gave verbal consent to proceed.  Patient is accompanied by her daughter, who she is okay with being present.  She has been experiencing cough, congestion, weakness, lack of energy, no appetite, and possibly a fever for almost a week. She also mentions a headache and sore throat. Two home COVID tests were done. She feels slightly better over the past week and has been drinking a lot of water  to stay hydrated. She took Tylenol  this morning and has been using Coricidin for her symptoms. She has not used any decongestants like Sudafed.  Her cough worsens at night, particularly after taking Coricidin, which she feels might be exacerbating her cough. No chest tightness or shortness of breath at present. No pain or pressure in her face or head recently.  She denies any history of COPD or asthma and does not smoke. She has a history of elevated blood pressure and has already taken her medication today. No shortness of breath, chest tightness, or facial pain or pressure.     ROS: Negative unless specifically indicated above in HPI.   Relevant past medical history reviewed and updated as indicated.   Allergies and medications reviewed and updated.   Current  Outpatient Medications:    ACCU-CHEK GUIDE TEST test strip, USE 1 STRIP TO CHECK GLUCOSE ONCE DAILY, Disp: 100 each, Rfl: 0   apixaban  (ELIQUIS ) 5 MG TABS tablet, Take 1 tablet by mouth twice daily, Disp: 60 tablet, Rfl: 5   atorvastatin  (LIPITOR) 10 MG tablet, Take 1 tablet by mouth once daily, Disp: 180 tablet, Rfl: 0   benazepril  (LOTENSIN ) 40 MG tablet, Take 1 tablet by mouth once daily, Disp: 90 tablet, Rfl: 0   Blood Glucose Monitoring Suppl (ONETOUCH VERIO) w/Device KIT, USE TO TEST BLOOD SUGAR ONCE DAILY AS DIRECTED, Disp: 1 kit, Rfl: 0   diclofenac  sodium (VOLTAREN ) 1 % GEL, Apply 3 g to 3 large joints up to 3 times daily., Disp: 3 Tube, Rfl: 3   diltiazem  (DILT-XR) 120 MG 24 hr capsule, Take 1 capsule by mouth once daily, Disp: 90 capsule, Rfl: 1   fluticasone  (FLONASE ) 50 MCG/ACT nasal spray, Place 1 spray into both nostrils as needed for allergies or rhinitis., Disp: , Rfl:    hydrochlorothiazide  (HYDRODIURIL ) 12.5 MG tablet, Take 1 tablet by mouth once daily, Disp: 90 tablet, Rfl: 0   hydroxychloroquine  (PLAQUENIL ) 200 MG tablet, Take 1 tablet by mouth once daily, Disp: 90 tablet, Rfl: 0   Magnesium 250 MG TABS, Take 250 mg by mouth daily., Disp: , Rfl:    metoprolol  tartrate (LOPRESSOR ) 25 MG tablet, Take 1 tablet by mouth twice daily, Disp: 180 tablet, Rfl: 3   Multiple Vitamins-Minerals (CENTRUM SILVER  50+WOMEN) TABS, Take 1 tablet by mouth daily at 6 (six) AM.,  Disp: , Rfl:    Omega-3 Fatty Acids (FISH OIL) 1000 MG CAPS, Take 1,000 mg by mouth daily., Disp: , Rfl:    omeprazole  (PRILOSEC) 40 MG capsule, Take 1 capsule by mouth once daily, Disp: 90 capsule, Rfl: 0   ONETOUCH DELICA LANCETS 30G MISC, USE TO TEST ONCE DAILY, Disp: 100 each, Rfl: 2   ONETOUCH VERIO test strip, USE TO TEST BLOOD SUGAR ONCE DAILY, Disp: 100 each, Rfl: 0   Polyvinyl Alcohol-Povidone (REFRESH OP), Apply to eye 2 (two) times daily., Disp: , Rfl:    promethazine -dextromethorphan (PROMETHAZINE -DM) 6.25-15  MG/5ML syrup, Take 5 mLs by mouth 4 (four) times daily as needed for cough., Disp: 118 mL, Rfl: 0  Allergies  Allergen Reactions   Levofloxacin Hives and Itching   Nitrofurantoin Hives and Itching   Penicillins Hives and Itching   Sulfonamide Derivatives Itching   Pantoprazole  Other (See Comments)    Bad dreams    Objective:   BP (!) 142/82   Pulse 63   Temp 98.6 F (37 C)   Resp 18   Ht 5' 9.5 (1.765 m)   Wt 176 lb (79.8 kg)   SpO2 (!) 18%   PF 99 L/min   BMI 25.62 kg/m    Physical Exam Vitals reviewed.  Constitutional:      General: She is not in acute distress.    Appearance: Normal appearance. She is not ill-appearing, toxic-appearing or diaphoretic.  HENT:     Head: Normocephalic and atraumatic.     Right Ear: Tympanic membrane, ear canal and external ear normal. There is no impacted cerumen.     Left Ear: Tympanic membrane, ear canal and external ear normal. There is no impacted cerumen.     Nose: Nose normal. No congestion or rhinorrhea.     Right Sinus: No maxillary sinus tenderness or frontal sinus tenderness.     Left Sinus: No maxillary sinus tenderness or frontal sinus tenderness.     Mouth/Throat:     Mouth: Mucous membranes are moist.     Pharynx: Oropharynx is clear. No oropharyngeal exudate or posterior oropharyngeal erythema.  Eyes:     General: No scleral icterus.       Right eye: No discharge.        Left eye: No discharge.     Conjunctiva/sclera: Conjunctivae normal.  Cardiovascular:     Rate and Rhythm: Normal rate and regular rhythm.     Heart sounds: Normal heart sounds. No murmur heard.    No friction rub. No gallop.  Pulmonary:     Effort: Pulmonary effort is normal. No respiratory distress.     Breath sounds: Normal breath sounds. No stridor. No wheezing, rhonchi or rales.  Musculoskeletal:        General: Normal range of motion.     Cervical back: Normal range of motion.  Lymphadenopathy:     Cervical: No cervical adenopathy.   Skin:    General: Skin is warm and dry.     Capillary Refill: Capillary refill takes less than 2 seconds.  Neurological:     General: No focal deficit present.     Mental Status: She is alert and oriented to person, place, and time. Mental status is at baseline.  Psychiatric:        Mood and Affect: Mood normal.        Behavior: Behavior normal.        Thought Content: Thought content normal.        Judgment:  Judgment normal.

## 2023-12-20 NOTE — Telephone Encounter (Signed)
 FYI Only or Action Required?: FYI only for provider: appointment scheduled on 12/20/23.  Patient was last seen in primary care on 11/22/2023 by Geofm Glade PARAS, MD.  Called Nurse Triage reporting Cough.  Symptoms began a week ago.  Interventions attempted: coricidin.  Symptoms are: unchanged.  Triage Disposition: See Physician Within 24 Hours  Patient/caregiver understands and will follow disposition?: Yes   Copied from CRM #[id number]. Topic: Clinical - Red Word Triage >> Dec 20, 2023 10:34 AM Joesph NOVAK wrote: Red Word that prompted transfer to Nurse Triage: productive cough with colored phlegm. Green/yellow. Started out with a sore throat and headache. Body aches. Reason for Disposition  [1] Continuous (nonstop) coughing interferes with work or school AND [2] no improvement using cough treatment per Care Advice  Answer Assessment - Initial Assessment Questions No available appts with pcp. Appt 12/20/23.  Advised call back or ED/911 if symptoms worsen. Patient reports:  1. ONSET: When did the cough begin?      7 days ago 2. SEVERITY: How bad is the cough today?      Moderate morning and night severe 3. SPUTUM: Describe the color of your sputum (e.g., none, dry cough; clear, white, yellow, green)     Clear, yellow, green 4. HEMOPTYSIS: Are you coughing up any blood? If Yes, ask: How much? (e.g., flecks, streaks, tablespoons, etc.)    X1, barely could see spot 5. DIFFICULTY BREATHING: Are you having difficulty breathing? If Yes, ask: How bad is it? (e.g., mild, moderate, severe)      no 6. FEVER: Do you have a fever? If Yes, ask: What is your temperature, how was it measured, and when did it start?     Denies fever, chills, vomiting 7. CARDIAC HISTORY: Do you have any history of heart disease? (e.g., heart attack, congestive heart failure)      AFIB 8. LUNG HISTORY: Do you have any history of lung disease?  (e.g., pulmonary embolus, asthma, emphysema)     no 9.  PE RISK FACTORS: Do you have a history of blood clots? (or: recent major surgery, recent prolonged travel, bedridden)     no 10. OTHER SYMPTOMS: Do you have any other symptoms? (e.g., runny nose, wheezing, chest pain)       Denies chest pain, wheezing, runny nose, sore throat,  body aches, HA 12. TRAVEL: Have you traveled out of the country in the last month? (e.g., travel history, exposures)       No  coricidin  Protocols used: Cough - Acute Productive-A-AH

## 2023-12-21 ENCOUNTER — Ambulatory Visit: Admitting: Internal Medicine

## 2023-12-23 ENCOUNTER — Other Ambulatory Visit: Payer: Self-pay | Admitting: Cardiology

## 2023-12-23 ENCOUNTER — Other Ambulatory Visit: Payer: Self-pay | Admitting: Rheumatology

## 2023-12-23 ENCOUNTER — Other Ambulatory Visit: Payer: Self-pay | Admitting: Internal Medicine

## 2023-12-23 NOTE — Telephone Encounter (Signed)
 Last Fill: 09/30/2023  Eye exam: 08/23/2023   Labs: 09/27/2023 GFR 53.76, WBC 3.6  Next Visit: 03/20/2024  Last Visit: 10/12/2023  IK:Dgnhmzw'd syndrome with keratoconjunctivitis sicca    Current Dose per office note on 10/12/2023: Plaquenil  200 mg 1 tablet by mouth daily.   Okay to refill Plaquenil ?

## 2023-12-27 ENCOUNTER — Telehealth: Payer: Self-pay | Admitting: Cardiology

## 2023-12-27 NOTE — Telephone Encounter (Signed)
*  STAT* If patient is at the pharmacy, call can be transferred to refill team.   1. Which medications need to be refilled? (please list name of each medication and dose if known)   diltiazem  (DILT-XR) 120 MG 24 hr capsule   2. Would you like to learn more about the convenience, safety, & potential cost savings by using the [facility]?   3. Are you open to using the [facility] (Type [facility]. ).   4. Which pharmacy/location (including street and city if local pharmacy) is medication to be sent to?  [facility] [address], [place] - [address]   5. Do they need a 30 day or 90 day supply?  90 day  Patient stated she is completely out of this medication.

## 2023-12-28 MED ORDER — DILTIAZEM HCL ER 120 MG PO CP24: 120.0000 mg | ORAL_CAPSULE | Freq: Every day | ORAL | 3 refills | Status: DC

## 2023-12-28 NOTE — Telephone Encounter (Signed)
 Refill sent

## 2023-12-29 MED ORDER — DILTIAZEM HCL ER 120 MG PO CP24: 120.0000 mg | ORAL_CAPSULE | Freq: Every day | ORAL | 3 refills | Status: AC

## 2023-12-29 NOTE — Addendum Note (Signed)
 Addended by: MEMORY DELON POUR on: 12/29/2023 07:06 AM   Modules accepted: Orders

## 2024-01-31 ENCOUNTER — Encounter: Payer: Self-pay | Admitting: Internal Medicine

## 2024-01-31 ENCOUNTER — Telehealth: Payer: Self-pay | Admitting: Cardiology

## 2024-01-31 ENCOUNTER — Encounter: Payer: Self-pay | Admitting: Cardiology

## 2024-01-31 DIAGNOSIS — M7989 Other specified soft tissue disorders: Secondary | ICD-10-CM

## 2024-01-31 NOTE — Telephone Encounter (Signed)
 Daughter Al[place]) returned staff call.

## 2024-01-31 NOTE — Telephone Encounter (Signed)
 Spoke with Ronal (daughter) stated that she wants her mother to see Vein and Vascular specialist. Due to the circulation not being good. Requesting  her legs to be evaluated. Daughter stated that her mother stated her leg gets heavy and may have some discoloration. I advised her that I will send to Dr. Alvan for approval to place referral.

## 2024-02-01 ENCOUNTER — Other Ambulatory Visit: Payer: Self-pay | Admitting: Internal Medicine

## 2024-02-02 ENCOUNTER — Telehealth: Payer: Self-pay

## 2024-02-02 NOTE — Telephone Encounter (Signed)
 Spoke with patient's daughter and number given to her to call Dr. Alvan to change location.

## 2024-02-02 NOTE — Telephone Encounter (Signed)
 Copied from CRM [id number]. Topic: General - Other >> Feb 02, 2024  9:57 AM Sophia H wrote: Reason for CRM: Daughter Ronal is returning a phone call to [place] regarding the patient. Caller disconnected while on hold, please return call # [phone] >> Feb 02, 2024 11:16 AM Mercedes MATSU wrote: Patient states that she would like her vascular surgery referral sent to somebody in [place] not in [place]. Patient is also requesting a call back and can be reached at the number on file.

## 2024-02-02 NOTE — Telephone Encounter (Signed)
 Per Dr. Alvan:  Can refer to vascular for leg swelling   JINNY Alvan MD  Sent MyChart message in regards to referral placed

## 2024-02-02 NOTE — Telephone Encounter (Signed)
 Copied from CRM [id number]. Topic: General - Other >> Feb 02, 2024  9:57 AM Sophia H wrote: Reason for CRM: Daughter Ronal is returning a phone call to [place] regarding the patient. Caller disconnected while on hold, please return call # [phone]

## 2024-02-10 ENCOUNTER — Other Ambulatory Visit: Payer: Self-pay | Admitting: Internal Medicine

## 2024-02-11 ENCOUNTER — Other Ambulatory Visit: Payer: Self-pay | Admitting: Cardiology

## 2024-03-20 ENCOUNTER — Ambulatory Visit: Admitting: Rheumatology

## 2024-04-06 ENCOUNTER — Encounter: Admitting: Internal Medicine

## 2024-04-17 ENCOUNTER — Ambulatory Visit (HOSPITAL_COMMUNITY)

## 2024-04-17 ENCOUNTER — Encounter

## 2024-09-26 ENCOUNTER — Ambulatory Visit
